# Patient Record
Sex: Male | Born: 1945
Health system: Southern US, Community
[De-identification: ages and names within clinical notes are randomized; demographics above are authoritative.]

## PROBLEM LIST (undated history)

## (undated) DIAGNOSIS — T7840XA Allergy, unspecified, initial encounter: Secondary | ICD-10-CM

## (undated) DIAGNOSIS — N200 Calculus of kidney: Secondary | ICD-10-CM

## (undated) DIAGNOSIS — K589 Irritable bowel syndrome without diarrhea: Secondary | ICD-10-CM

## (undated) DIAGNOSIS — I639 Cerebral infarction, unspecified: Secondary | ICD-10-CM

## (undated) DIAGNOSIS — N400 Enlarged prostate without lower urinary tract symptoms: Secondary | ICD-10-CM

## (undated) DIAGNOSIS — M1990 Unspecified osteoarthritis, unspecified site: Secondary | ICD-10-CM

## (undated) DIAGNOSIS — Z0000 Encounter for general adult medical examination without abnormal findings: Secondary | ICD-10-CM

## (undated) DIAGNOSIS — F32A Depression, unspecified: Secondary | ICD-10-CM

## (undated) DIAGNOSIS — K2270 Barrett's esophagus without dysplasia: Secondary | ICD-10-CM

## (undated) DIAGNOSIS — H6090 Unspecified otitis externa, unspecified ear: Secondary | ICD-10-CM

## (undated) DIAGNOSIS — B019 Varicella without complication: Secondary | ICD-10-CM

## (undated) DIAGNOSIS — B269 Mumps without complication: Secondary | ICD-10-CM

## (undated) DIAGNOSIS — J302 Other seasonal allergic rhinitis: Secondary | ICD-10-CM

## (undated) DIAGNOSIS — R739 Hyperglycemia, unspecified: Secondary | ICD-10-CM

## (undated) DIAGNOSIS — C44300 Unspecified malignant neoplasm of skin of unspecified part of face: Secondary | ICD-10-CM

## (undated) DIAGNOSIS — I10 Essential (primary) hypertension: Secondary | ICD-10-CM

## (undated) DIAGNOSIS — K219 Gastro-esophageal reflux disease without esophagitis: Secondary | ICD-10-CM

## (undated) DIAGNOSIS — Z87442 Personal history of urinary calculi: Secondary | ICD-10-CM

## (undated) DIAGNOSIS — M79642 Pain in left hand: Secondary | ICD-10-CM

## (undated) DIAGNOSIS — B059 Measles without complication: Secondary | ICD-10-CM

## (undated) DIAGNOSIS — E785 Hyperlipidemia, unspecified: Secondary | ICD-10-CM

## (undated) DIAGNOSIS — J45909 Unspecified asthma, uncomplicated: Secondary | ICD-10-CM

## (undated) DIAGNOSIS — F329 Major depressive disorder, single episode, unspecified: Secondary | ICD-10-CM

## (undated) DIAGNOSIS — F419 Anxiety disorder, unspecified: Secondary | ICD-10-CM

## (undated) DIAGNOSIS — G20A1 Parkinson's disease without dyskinesia, without mention of fluctuations: Secondary | ICD-10-CM

## (undated) DIAGNOSIS — Q2112 Patent foramen ovale: Secondary | ICD-10-CM

## (undated) HISTORY — DX: Pain in left hand: M79.642

## (undated) HISTORY — DX: Irritable bowel syndrome, unspecified: K58.9

## (undated) HISTORY — DX: Unspecified osteoarthritis, unspecified site: M19.90

## (undated) HISTORY — DX: Mumps without complication: B26.9

## (undated) HISTORY — PX: WISDOM TOOTH EXTRACTION: SHX21

## (undated) HISTORY — DX: Allergy, unspecified, initial encounter: T78.40XA

## (undated) HISTORY — DX: Barrett's esophagus without dysplasia: K22.70

## (undated) HISTORY — DX: Benign prostatic hyperplasia without lower urinary tract symptoms: N40.0

## (undated) HISTORY — DX: Measles without complication: B05.9

## (undated) HISTORY — DX: Hyperglycemia, unspecified: R73.9

## (undated) HISTORY — PX: COLONOSCOPY: SHX174

## (undated) HISTORY — DX: Essential (primary) hypertension: I10

## (undated) HISTORY — PX: LITHOTRIPSY: SUR834

## (undated) HISTORY — DX: Unspecified asthma, uncomplicated: J45.909

## (undated) HISTORY — DX: Cerebral infarction, unspecified: I63.9

## (undated) HISTORY — DX: Varicella without complication: B01.9

## (undated) HISTORY — DX: Encounter for general adult medical examination without abnormal findings: Z00.00

## (undated) HISTORY — DX: Unspecified otitis externa, unspecified ear: H60.90

## (undated) HISTORY — DX: Other seasonal allergic rhinitis: J30.2

---

## 2011-10-08 DIAGNOSIS — F339 Major depressive disorder, recurrent, unspecified: Secondary | ICD-10-CM | POA: Diagnosis not present

## 2011-10-08 DIAGNOSIS — F411 Generalized anxiety disorder: Secondary | ICD-10-CM | POA: Diagnosis not present

## 2012-01-09 DIAGNOSIS — E789 Disorder of lipoprotein metabolism, unspecified: Secondary | ICD-10-CM | POA: Diagnosis not present

## 2012-01-09 DIAGNOSIS — R7301 Impaired fasting glucose: Secondary | ICD-10-CM | POA: Diagnosis not present

## 2012-01-09 DIAGNOSIS — E538 Deficiency of other specified B group vitamins: Secondary | ICD-10-CM | POA: Diagnosis not present

## 2012-01-09 DIAGNOSIS — I10 Essential (primary) hypertension: Secondary | ICD-10-CM | POA: Diagnosis not present

## 2012-01-09 DIAGNOSIS — E559 Vitamin D deficiency, unspecified: Secondary | ICD-10-CM | POA: Diagnosis not present

## 2012-01-10 DIAGNOSIS — F411 Generalized anxiety disorder: Secondary | ICD-10-CM | POA: Diagnosis not present

## 2012-01-10 DIAGNOSIS — F339 Major depressive disorder, recurrent, unspecified: Secondary | ICD-10-CM | POA: Diagnosis not present

## 2012-01-17 DIAGNOSIS — E291 Testicular hypofunction: Secondary | ICD-10-CM | POA: Diagnosis not present

## 2012-01-17 DIAGNOSIS — I6350 Cerebral infarction due to unspecified occlusion or stenosis of unspecified cerebral artery: Secondary | ICD-10-CM | POA: Diagnosis not present

## 2012-01-17 DIAGNOSIS — E7800 Pure hypercholesterolemia, unspecified: Secondary | ICD-10-CM | POA: Diagnosis not present

## 2012-01-17 DIAGNOSIS — Z23 Encounter for immunization: Secondary | ICD-10-CM | POA: Diagnosis not present

## 2012-04-02 DIAGNOSIS — R350 Frequency of micturition: Secondary | ICD-10-CM | POA: Diagnosis not present

## 2012-04-07 DIAGNOSIS — F339 Major depressive disorder, recurrent, unspecified: Secondary | ICD-10-CM | POA: Diagnosis not present

## 2012-04-07 DIAGNOSIS — F411 Generalized anxiety disorder: Secondary | ICD-10-CM | POA: Diagnosis not present

## 2012-04-23 DIAGNOSIS — L821 Other seborrheic keratosis: Secondary | ICD-10-CM | POA: Diagnosis not present

## 2012-04-23 DIAGNOSIS — D1801 Hemangioma of skin and subcutaneous tissue: Secondary | ICD-10-CM | POA: Diagnosis not present

## 2012-04-23 DIAGNOSIS — L909 Atrophic disorder of skin, unspecified: Secondary | ICD-10-CM | POA: Diagnosis not present

## 2012-04-23 DIAGNOSIS — L570 Actinic keratosis: Secondary | ICD-10-CM | POA: Diagnosis not present

## 2012-04-23 DIAGNOSIS — D235 Other benign neoplasm of skin of trunk: Secondary | ICD-10-CM | POA: Diagnosis not present

## 2012-05-05 DIAGNOSIS — R350 Frequency of micturition: Secondary | ICD-10-CM | POA: Diagnosis not present

## 2012-05-05 DIAGNOSIS — R300 Dysuria: Secondary | ICD-10-CM | POA: Diagnosis not present

## 2012-07-01 DIAGNOSIS — H1045 Other chronic allergic conjunctivitis: Secondary | ICD-10-CM | POA: Diagnosis not present

## 2012-07-01 DIAGNOSIS — H2510 Age-related nuclear cataract, unspecified eye: Secondary | ICD-10-CM | POA: Diagnosis not present

## 2012-07-07 DIAGNOSIS — F411 Generalized anxiety disorder: Secondary | ICD-10-CM | POA: Diagnosis not present

## 2012-07-07 DIAGNOSIS — F339 Major depressive disorder, recurrent, unspecified: Secondary | ICD-10-CM | POA: Diagnosis not present

## 2012-07-09 DIAGNOSIS — Z23 Encounter for immunization: Secondary | ICD-10-CM | POA: Diagnosis not present

## 2012-08-19 DIAGNOSIS — M545 Low back pain: Secondary | ICD-10-CM | POA: Diagnosis not present

## 2012-09-25 DIAGNOSIS — I10 Essential (primary) hypertension: Secondary | ICD-10-CM | POA: Diagnosis not present

## 2012-09-25 DIAGNOSIS — Z8673 Personal history of transient ischemic attack (TIA), and cerebral infarction without residual deficits: Secondary | ICD-10-CM | POA: Diagnosis not present

## 2012-09-25 DIAGNOSIS — F329 Major depressive disorder, single episode, unspecified: Secondary | ICD-10-CM | POA: Diagnosis not present

## 2012-09-25 DIAGNOSIS — M549 Dorsalgia, unspecified: Secondary | ICD-10-CM | POA: Diagnosis not present

## 2012-10-06 DIAGNOSIS — I10 Essential (primary) hypertension: Secondary | ICD-10-CM | POA: Diagnosis not present

## 2012-10-06 DIAGNOSIS — R5381 Other malaise: Secondary | ICD-10-CM | POA: Diagnosis not present

## 2012-10-06 DIAGNOSIS — Z125 Encounter for screening for malignant neoplasm of prostate: Secondary | ICD-10-CM | POA: Diagnosis not present

## 2012-10-06 DIAGNOSIS — E789 Disorder of lipoprotein metabolism, unspecified: Secondary | ICD-10-CM | POA: Diagnosis not present

## 2012-11-05 DIAGNOSIS — M999 Biomechanical lesion, unspecified: Secondary | ICD-10-CM | POA: Diagnosis not present

## 2012-11-05 DIAGNOSIS — S332XXA Dislocation of sacroiliac and sacrococcygeal joint, initial encounter: Secondary | ICD-10-CM | POA: Diagnosis not present

## 2012-11-05 DIAGNOSIS — S33101A Dislocation of unspecified lumbar vertebra, initial encounter: Secondary | ICD-10-CM | POA: Diagnosis not present

## 2012-11-06 DIAGNOSIS — S332XXA Dislocation of sacroiliac and sacrococcygeal joint, initial encounter: Secondary | ICD-10-CM | POA: Diagnosis not present

## 2012-11-06 DIAGNOSIS — S33101A Dislocation of unspecified lumbar vertebra, initial encounter: Secondary | ICD-10-CM | POA: Diagnosis not present

## 2012-11-06 DIAGNOSIS — M999 Biomechanical lesion, unspecified: Secondary | ICD-10-CM | POA: Diagnosis not present

## 2012-11-07 DIAGNOSIS — S332XXA Dislocation of sacroiliac and sacrococcygeal joint, initial encounter: Secondary | ICD-10-CM | POA: Diagnosis not present

## 2012-11-07 DIAGNOSIS — M999 Biomechanical lesion, unspecified: Secondary | ICD-10-CM | POA: Diagnosis not present

## 2012-11-07 DIAGNOSIS — S33101A Dislocation of unspecified lumbar vertebra, initial encounter: Secondary | ICD-10-CM | POA: Diagnosis not present

## 2012-11-10 DIAGNOSIS — M999 Biomechanical lesion, unspecified: Secondary | ICD-10-CM | POA: Diagnosis not present

## 2012-11-10 DIAGNOSIS — S33101A Dislocation of unspecified lumbar vertebra, initial encounter: Secondary | ICD-10-CM | POA: Diagnosis not present

## 2012-11-10 DIAGNOSIS — S332XXA Dislocation of sacroiliac and sacrococcygeal joint, initial encounter: Secondary | ICD-10-CM | POA: Diagnosis not present

## 2012-11-12 DIAGNOSIS — S33101A Dislocation of unspecified lumbar vertebra, initial encounter: Secondary | ICD-10-CM | POA: Diagnosis not present

## 2012-11-12 DIAGNOSIS — M999 Biomechanical lesion, unspecified: Secondary | ICD-10-CM | POA: Diagnosis not present

## 2012-11-12 DIAGNOSIS — S332XXA Dislocation of sacroiliac and sacrococcygeal joint, initial encounter: Secondary | ICD-10-CM | POA: Diagnosis not present

## 2012-12-15 DIAGNOSIS — F411 Generalized anxiety disorder: Secondary | ICD-10-CM | POA: Diagnosis not present

## 2012-12-15 DIAGNOSIS — F339 Major depressive disorder, recurrent, unspecified: Secondary | ICD-10-CM | POA: Diagnosis not present

## 2012-12-16 DIAGNOSIS — K219 Gastro-esophageal reflux disease without esophagitis: Secondary | ICD-10-CM | POA: Diagnosis not present

## 2012-12-16 DIAGNOSIS — I6350 Cerebral infarction due to unspecified occlusion or stenosis of unspecified cerebral artery: Secondary | ICD-10-CM | POA: Diagnosis not present

## 2012-12-16 DIAGNOSIS — Z1211 Encounter for screening for malignant neoplasm of colon: Secondary | ICD-10-CM | POA: Diagnosis not present

## 2013-01-08 DIAGNOSIS — Z1211 Encounter for screening for malignant neoplasm of colon: Secondary | ICD-10-CM | POA: Diagnosis not present

## 2013-01-08 DIAGNOSIS — I10 Essential (primary) hypertension: Secondary | ICD-10-CM | POA: Diagnosis not present

## 2013-01-08 DIAGNOSIS — K219 Gastro-esophageal reflux disease without esophagitis: Secondary | ICD-10-CM | POA: Diagnosis not present

## 2013-05-06 DIAGNOSIS — L821 Other seborrheic keratosis: Secondary | ICD-10-CM | POA: Diagnosis not present

## 2013-05-06 DIAGNOSIS — D235 Other benign neoplasm of skin of trunk: Secondary | ICD-10-CM | POA: Diagnosis not present

## 2013-05-06 DIAGNOSIS — D1801 Hemangioma of skin and subcutaneous tissue: Secondary | ICD-10-CM | POA: Diagnosis not present

## 2013-05-06 DIAGNOSIS — L570 Actinic keratosis: Secondary | ICD-10-CM | POA: Diagnosis not present

## 2013-05-06 DIAGNOSIS — D2360 Other benign neoplasm of skin of unspecified upper limb, including shoulder: Secondary | ICD-10-CM | POA: Diagnosis not present

## 2013-05-20 DIAGNOSIS — J309 Allergic rhinitis, unspecified: Secondary | ICD-10-CM | POA: Diagnosis not present

## 2013-05-20 DIAGNOSIS — I10 Essential (primary) hypertension: Secondary | ICD-10-CM | POA: Diagnosis not present

## 2013-05-20 DIAGNOSIS — I6350 Cerebral infarction due to unspecified occlusion or stenosis of unspecified cerebral artery: Secondary | ICD-10-CM | POA: Diagnosis not present

## 2013-05-20 DIAGNOSIS — N400 Enlarged prostate without lower urinary tract symptoms: Secondary | ICD-10-CM | POA: Diagnosis not present

## 2013-06-03 DIAGNOSIS — R319 Hematuria, unspecified: Secondary | ICD-10-CM | POA: Diagnosis not present

## 2013-06-03 DIAGNOSIS — R350 Frequency of micturition: Secondary | ICD-10-CM | POA: Diagnosis not present

## 2013-06-03 DIAGNOSIS — N200 Calculus of kidney: Secondary | ICD-10-CM | POA: Diagnosis not present

## 2013-06-04 DIAGNOSIS — N200 Calculus of kidney: Secondary | ICD-10-CM | POA: Diagnosis not present

## 2013-06-04 DIAGNOSIS — N1330 Unspecified hydronephrosis: Secondary | ICD-10-CM | POA: Diagnosis not present

## 2013-06-09 DIAGNOSIS — N200 Calculus of kidney: Secondary | ICD-10-CM | POA: Diagnosis not present

## 2013-06-09 DIAGNOSIS — Z79899 Other long term (current) drug therapy: Secondary | ICD-10-CM | POA: Diagnosis not present

## 2013-06-09 DIAGNOSIS — I10 Essential (primary) hypertension: Secondary | ICD-10-CM | POA: Diagnosis not present

## 2013-06-09 DIAGNOSIS — I998 Other disorder of circulatory system: Secondary | ICD-10-CM | POA: Diagnosis not present

## 2013-06-11 DIAGNOSIS — N200 Calculus of kidney: Secondary | ICD-10-CM | POA: Diagnosis not present

## 2013-06-11 DIAGNOSIS — I998 Other disorder of circulatory system: Secondary | ICD-10-CM | POA: Diagnosis not present

## 2013-06-11 DIAGNOSIS — Z79899 Other long term (current) drug therapy: Secondary | ICD-10-CM | POA: Diagnosis not present

## 2013-06-11 DIAGNOSIS — I10 Essential (primary) hypertension: Secondary | ICD-10-CM | POA: Diagnosis not present

## 2013-06-16 DIAGNOSIS — F411 Generalized anxiety disorder: Secondary | ICD-10-CM | POA: Diagnosis not present

## 2013-06-18 DIAGNOSIS — N200 Calculus of kidney: Secondary | ICD-10-CM | POA: Diagnosis not present

## 2013-07-16 DIAGNOSIS — Z23 Encounter for immunization: Secondary | ICD-10-CM | POA: Diagnosis not present

## 2013-09-28 DIAGNOSIS — N401 Enlarged prostate with lower urinary tract symptoms: Secondary | ICD-10-CM | POA: Diagnosis not present

## 2013-09-28 DIAGNOSIS — N529 Male erectile dysfunction, unspecified: Secondary | ICD-10-CM | POA: Diagnosis not present

## 2013-09-28 DIAGNOSIS — N200 Calculus of kidney: Secondary | ICD-10-CM | POA: Diagnosis not present

## 2013-09-30 LAB — HM COLONOSCOPY: HM COLON: NORMAL

## 2013-10-30 ENCOUNTER — Emergency Department (HOSPITAL_BASED_OUTPATIENT_CLINIC_OR_DEPARTMENT_OTHER)
Admission: EM | Admit: 2013-10-30 | Discharge: 2013-10-30 | Disposition: A | Discharge status: Home / Self Care | Payer: Medicare Other | Attending: Emergency Medicine | Admitting: Emergency Medicine

## 2013-10-30 ENCOUNTER — Encounter (HOSPITAL_BASED_OUTPATIENT_CLINIC_OR_DEPARTMENT_OTHER): Payer: Self-pay | Admitting: Emergency Medicine

## 2013-10-30 ENCOUNTER — Emergency Department (HOSPITAL_BASED_OUTPATIENT_CLINIC_OR_DEPARTMENT_OTHER): Payer: Medicare Other

## 2013-10-30 DIAGNOSIS — Z87442 Personal history of urinary calculi: Secondary | ICD-10-CM | POA: Insufficient documentation

## 2013-10-30 DIAGNOSIS — Z8673 Personal history of transient ischemic attack (TIA), and cerebral infarction without residual deficits: Secondary | ICD-10-CM | POA: Insufficient documentation

## 2013-10-30 DIAGNOSIS — Z79899 Other long term (current) drug therapy: Secondary | ICD-10-CM | POA: Diagnosis not present

## 2013-10-30 DIAGNOSIS — E785 Hyperlipidemia, unspecified: Secondary | ICD-10-CM | POA: Diagnosis not present

## 2013-10-30 DIAGNOSIS — F329 Major depressive disorder, single episode, unspecified: Secondary | ICD-10-CM | POA: Insufficient documentation

## 2013-10-30 DIAGNOSIS — M79609 Pain in unspecified limb: Secondary | ICD-10-CM | POA: Insufficient documentation

## 2013-10-30 DIAGNOSIS — K219 Gastro-esophageal reflux disease without esophagitis: Secondary | ICD-10-CM | POA: Diagnosis not present

## 2013-10-30 DIAGNOSIS — M79644 Pain in right finger(s): Secondary | ICD-10-CM

## 2013-10-30 DIAGNOSIS — M19049 Primary osteoarthritis, unspecified hand: Secondary | ICD-10-CM | POA: Diagnosis not present

## 2013-10-30 DIAGNOSIS — F3289 Other specified depressive episodes: Secondary | ICD-10-CM | POA: Diagnosis not present

## 2013-10-30 DIAGNOSIS — I10 Essential (primary) hypertension: Secondary | ICD-10-CM | POA: Diagnosis not present

## 2013-10-30 HISTORY — DX: Depression, unspecified: F32.A

## 2013-10-30 HISTORY — DX: Calculus of kidney: N20.0

## 2013-10-30 HISTORY — DX: Gastro-esophageal reflux disease without esophagitis: K21.9

## 2013-10-30 HISTORY — DX: Essential (primary) hypertension: I10

## 2013-10-30 HISTORY — DX: Major depressive disorder, single episode, unspecified: F32.9

## 2013-10-30 HISTORY — DX: Cerebral infarction, unspecified: I63.9

## 2013-10-30 HISTORY — DX: Hyperlipidemia, unspecified: E78.5

## 2013-10-30 MED ORDER — IBUPROFEN 400 MG PO TABS: 400.0000 mg | ORAL_TABLET | Freq: Four times a day (QID) | ORAL | Status: DC | PRN

## 2013-10-30 NOTE — ED Notes (Signed)
Pt reports lifting a bed 1 week ago and has had pain in right and since.  Pain is better with immobilization.

## 2013-10-30 NOTE — ED Notes (Signed)
Patient transported to X-ray 

## 2013-10-30 NOTE — Discharge Instructions (Signed)
De Quervain's Tenosynovitis  De Quervain's tenosynovitis involves inflammation of one or two tendon linings (sheaths) or strain of one or two tendons to the thumb: extensor pollicis brevis (EPB), or abductor pollicis longus (APL). This causes pain on the side of the wrist and base of the thumb. Tendon sheaths secrete a fluid that lubricates the tendon, allowing the tendon to move smoothly. When the sheath becomes inflamed, the tendon cannot move freely in the sheath. Both the EPB and APL tendons are important for proper use of the hand. The EPB tendon is important for straightening the thumb. The APL tendon is important for moving the thumb away from the index finger (abducting). The two tendons pass through a small tube (canal) in the wrist, near the base of the thumb. When the tendons become inflamed, pain is usually felt in this area.  SYMPTOMS   · Pain, tenderness, swelling, warmth, or redness over the base of the thumb and thumb side of the wrist.  · Pain that gets worse when straightening the thumb.  · Pain that gets worse when moving the thumb away from the index finger, against resistance.  · Pain with pinching or gripping.  · Locking or catching of the thumb.  · Limited motion of the thumb.  · Crackling sound (crepitation) when the tendon or thumb is moved or touched.  · Fluid-filled cyst in the area of the base of the thumb.  CAUSES   · Tenosynovitis is often linked with overuse of the wrist.  · Tenosynovitis may be caused by repeated injury to the thumb muscle and tendon units, and with repeated motions of the hand and wrist, due to friction of the tendon within the lining (sheath).  · Tenosynovitis may also be due to a sudden increase in activity or change in activity.  RISK INCREASES WITH:  · Sports that involve repeated hand and wrist motions (golf, bowling, tennis, squash, racquetball).  · Heavy labor.  · Poor physical wrist strength and flexibility.  · Failure to warm up properly before practice or  play.  · Male gender.  · New mothers who hold their baby's head for long periods or lift infants with thumbs in the infant's armpit (axilla).  PREVENTION  · Warm up and stretch properly before practice or competition.  · Allow enough time for rest and recovery between practices and competition.  · Maintain appropriate conditioning:  · Cardiovascular fitness.  · Forearm, wrist, and hand flexibility.  · Muscle strength and endurance.  · Use proper exercise technique.  PROGNOSIS   This condition is usually curable within 6 weeks, if treated properly with non-surgical treatment and resting of the affected area.   RELATED COMPLICATIONS   · Longer healing time if not properly treated or if not given enough time to heal.  · Chronic inflammation, causing recurring symptoms of tenosynovitis. Permanent pain or restriction of movement.  · Risks of surgery: infection, bleeding, injury to nerves (numbness of the thumb), continued pain, incomplete release of the tendon sheath, recurring symptoms, cutting of the tendons, tendons sliding out of position, weakness of the thumb, thumb stiffness.  TREATMENT   First, treatment involves the use of medicine and ice, to reduce pain and inflammation. Patients are encouraged to stop or modify activities that aggravate the injury. Stretching and strengthening exercises may be advised. Exercises may be completed at home or with a therapist. You may be fitted with a brace or splint, to limit motion and allow the injury to heal. Your caregiver   may also choose to give you a corticosteroid injection, to reduce the pain and inflammation. If non-surgical treatment is not successful, surgery may be needed. Most tenosynovitis surgeries are done as outpatient procedures (you go home the same day). Surgery may involve local, regional (whole arm), or general anesthesia.   MEDICATION   · If pain medicine is needed, nonsteroidal anti-inflammatory medicines (aspirin and ibuprofen), or other minor pain  relievers (acetaminophen), are often advised.  · Do not take pain medicine for 7 days before surgery.  · Prescription pain relievers are often prescribed only after surgery. Use only as directed and only as much as you need.  · Corticosteroid injections may be given if your caregiver thinks they are needed. There is a limited number of times these injections may be given.  COLD THERAPY   · Cold treatment (icing) should be applied for 10 to 15 minutes every 2 to 3 hours for inflammation and pain, and immediately after activity that aggravates your symptoms. Use ice packs or an ice massage.  SEEK MEDICAL CARE IF:   · Symptoms get worse or do not improve in 2 to 4 weeks, despite treatment.  · You experience pain, numbness, or coldness in the hand.  · Blue, gray, or dark color appears in the fingernails.  · Any of the following occur after surgery: increased pain, swelling, redness, drainage of fluids, bleeding in the affected area, or signs of infection.  · New, unexplained symptoms develop. (Drugs used in treatment may produce side effects.)  Document Released: 09/03/2005 Document Revised: 11/26/2011 Document Reviewed: 12/16/2008  [employer]® Patient Information ©2014 [employer].

## 2013-10-30 NOTE — ED Provider Notes (Signed)
CSN: [id number]     Arrival date & time 10/30/13  7106 History   First MD Initiated Contact with Patient 10/30/13 0940     Chief Complaint  Patient presents with  . Hand Pain     (Consider location/radiation/quality/duration/timing/severity/associated sxs/prior Treatment) HPI Comments: Pt reports sharp pain over R thumb, thenar eminence, radiation sometimes into pointer finger.  Pain worse with flexion or opposition of the thumb. Pain better w/ immobilization.  No definite injury mechanism, but has been using hand saw, moving furniture since moving in Nov.   Patient is a 68 y.o. male presenting with hand pain. The history is provided by the patient. No language interpreter was used.  Hand Pain This is a new problem. The current episode started more than 2 days ago. The problem occurs constantly. The problem has not changed since onset.Pertinent negatives include no chest pain, no abdominal pain, no headaches and no shortness of breath. Nothing aggravates the symptoms. He has tried nothing for the symptoms. The treatment provided no relief.    Past Medical History  Diagnosis Date  . Kidney stones   . CVA (cerebral infarction)   . Depression   . Hyperlipemia   . Hypertension   . GERD (gastroesophageal reflux disease)    Past Surgical History  Procedure Laterality Date  . Lithotripsy    . Cystostectomy     No family history on file. History  Substance Use Topics  . Smoking status: Never Smoker   . Smokeless tobacco: Not on file  . Alcohol Use: Yes     Comment: 1-2 daily    Review of Systems  Constitutional: Negative for fever, activity change, appetite change and fatigue.  HENT: Negative for congestion, facial swelling, rhinorrhea and trouble swallowing.   Eyes: Negative for photophobia and pain.  Respiratory: Negative for cough, chest tightness and shortness of breath.   Cardiovascular: Negative for chest pain and leg swelling.  Gastrointestinal: Negative for nausea,  vomiting, abdominal pain, diarrhea and constipation.  Endocrine: Negative for polydipsia and polyuria.  Genitourinary: Negative for dysuria, urgency, decreased urine volume and difficulty urinating.  Musculoskeletal: Negative for back pain and gait problem.  Skin: Negative for color change, rash and wound.  Allergic/Immunologic: Negative for immunocompromised state.  Neurological: Negative for dizziness, facial asymmetry, speech difficulty, weakness, numbness and headaches.  Psychiatric/Behavioral: Negative for confusion, decreased concentration and agitation.      Allergies  Review of patient's allergies indicates no known allergies.  Home Medications   Current Outpatient Rx  Name  Route  Sig  Dispense  Refill  . Atorvastatin Calcium (LIPITOR PO)   Oral   Take by mouth.         . Cetirizine HCl (ZYRTEC ALLERGY PO)   Oral   Take by mouth.         . Citalopram Hydrobromide (CELEXA PO)   Oral   Take by mouth.         . Multiple Vitamins-Minerals (MULTIVITAMINS THER. W/MINERALS) TABS tablet   Oral   Take 1 tablet by mouth daily.         Marland Kitchen OMEPRAZOLE PO   Oral   Take by mouth.         Marland Kitchen UNKNOWN TO PATIENT               . ibuprofen (ADVIL,MOTRIN) 400 MG tablet   Oral   Take 1 tablet (400 mg total) by mouth every 6 (six) hours as needed.   30 tablet   0  BP 142/78  Pulse 85  Temp(Src) 98 F (36.7 C) (Oral)  Resp 18  Ht 5\' 8"  (1.727 m)  Wt 173 lb (78.472 kg)  BMI 26.31 kg/m2  SpO2 100% Physical Exam  Constitutional: He is oriented to person, place, and time. He appears well-developed and well-nourished. No distress.  HENT:  Head: Normocephalic and atraumatic.  Mouth/Throat: No oropharyngeal exudate.  Eyes: Pupils are equal, round, and reactive to light.  Neck: Normal range of motion. Neck supple.  Cardiovascular: Normal rate, regular rhythm and normal heart sounds.  Exam reveals no gallop and no friction rub.   No murmur  heard. Pulmonary/Chest: Effort normal and breath sounds normal. No respiratory distress. He has no wheezes. He has no rales.  Abdominal: Soft. Bowel sounds are normal. He exhibits no distension and no mass. There is no tenderness. There is no rebound and no guarding.  Musculoskeletal: Normal range of motion. He exhibits no edema.       Left hand: He exhibits tenderness and bony tenderness. He exhibits normal range of motion, normal two-point discrimination, normal capillary refill, no deformity and no laceration. Normal sensation noted. Decreased sensation is not present in the ulnar distribution and is not present in the medial redistribution. Normal strength noted.       Hands: Pain w/ flexion of thumb and opposition of thumb. +finekstein's test.   Neurological: He is alert and oriented to person, place, and time.  Skin: Skin is warm and dry.  Psychiatric: He has a normal mood and affect.    ED Course  Procedures (including critical care time) Labs Review Labs Reviewed - No data to display Imaging Review Dg Hand Complete Right  10/30/2013   CLINICAL DATA:  Pain  EXAM: RIGHT HAND - COMPLETE 3+ VIEW  COMPARISON:  None.  FINDINGS: Frontal, oblique, and lateral views were obtained. There is no fracture or dislocation. There is osteoarthritic change in the scaphotrapezial and saddle joints. There is also slight narrowing of all PIP and DIP joints. No erosive change or periostitis.  IMPRESSION: Multifocal osteoarthritic change. No fracture or dislocation. No erosive change.   Electronically Signed   By: Lowella Grip M.D.   On: 10/30/2013 09:57    EKG Interpretation   None       MDM   Final diagnoses:  Pain of right thumb    Pt is a 68 y.o. male with Pmhx as above who presents with with L hand pain over thenar eminence, thumb.  He has pain w/ flexion and opposition of thumb, sometimes radiation into pointer finger. No definite mechanism of injury, but has been doing increased handy  work since moving in Nov. Including moving furniture, using hand saw. On PE, VSS, pt in NAD.  Nml strength, sensation, cap refill.  +ttp over thenar eminence w/o wasting, + pain w/ Finkelstein's test. XR shows no focal fx, multifocal OA.  Suspect De. Quervain's tenosynovitis.  Will place in removable thumb spica, rec scheduled NSAIDs and have referred to Dr. Barbaraann Barthel w/ Sports medicine.       Neta Ehlers, MD 10/30/13 1013

## 2013-11-02 ENCOUNTER — Ambulatory Visit (INDEPENDENT_AMBULATORY_CARE_PROVIDER_SITE_OTHER): Payer: Medicare Other | Admitting: Family Medicine

## 2013-11-02 ENCOUNTER — Encounter: Payer: Self-pay | Admitting: Family Medicine

## 2013-11-02 VITALS — BP 123/79 | HR 76 | Ht 68.0 in | Wt 172.0 lb

## 2013-11-02 DIAGNOSIS — M189 Osteoarthritis of first carpometacarpal joint, unspecified: Secondary | ICD-10-CM

## 2013-11-02 DIAGNOSIS — M19049 Primary osteoarthritis, unspecified hand: Secondary | ICD-10-CM

## 2013-11-02 NOTE — Patient Instructions (Signed)
You have synovitis, flare of arthritis at the base of your thumb. You were given a cortisone shot today for this - avoid strenous activities with hand for 5-7 days including golf, manual labor. Ice or heat 15 minutes at a time 3-4 times a day (whichever feels better). Wear thumb spica brace as often as possible. Ibuprofen 800mg  three times a day OR aleve 2 tabs twice a day with food for pain and inflammation. Follow up with me in 1 month or as needed.

## 2013-11-04 ENCOUNTER — Encounter: Payer: Self-pay | Admitting: Family Medicine

## 2013-11-04 DIAGNOSIS — M189 Osteoarthritis of first carpometacarpal joint, unspecified: Secondary | ICD-10-CM | POA: Insufficient documentation

## 2013-11-04 DIAGNOSIS — N200 Calculus of kidney: Secondary | ICD-10-CM | POA: Diagnosis not present

## 2013-11-04 NOTE — Assessment & Plan Note (Signed)
discussed options.  He would like to go ahead with cortisone injection which was given today.  Continue thumb spica brace.  NSAIDs as needed.  Ice/heat as needed.  F/u in 1 month or prn.  After informed written consent patient was seated in chair in exam room.  Area overlying 1st CMC was prepped with alcohol swab after using ultrasound to identify joint space away from radial artery.  Then right 1st CMC joint injected with 0.5:0.34mL marcaine:depomedrol.  Patient tolerated procedure well without immediate complications.

## 2013-11-04 NOTE — Progress Notes (Signed)
Patient ID: Paul Drake, male   DOB: 1946-05-25, 68 y.o.   MRN: [id number]  PCP: No primary provider on file.  Subjective:   HPI: Patient is a 68 y.o. male here for right thumb pain.  Patient reports pain started on 2/6. He was moving furniture, a lot of pushing and squeezing that day. Started to get pain then but nothing acute. Some swelling. Taking ibuprofen. Pain localized to base of thumb. Using thumb spica brace from ED. Radiographs negative for fracture though does have DJD> Left handed  Past Medical History  Diagnosis Date  . Kidney stones   . CVA (cerebral infarction)   . Depression   . Hyperlipemia   . Hypertension   . GERD (gastroesophageal reflux disease)     Current Outpatient Prescriptions on File Prior to Visit  Medication Sig Dispense Refill  . Atorvastatin Calcium (LIPITOR PO) Take by mouth.      . Cetirizine HCl (ZYRTEC ALLERGY PO) Take by mouth.      . Citalopram Hydrobromide (CELEXA PO) Take by mouth.      Marland Kitchen ibuprofen (ADVIL,MOTRIN) 400 MG tablet Take 1 tablet (400 mg total) by mouth every 6 (six) hours as needed.  30 tablet  0  . Multiple Vitamins-Minerals (MULTIVITAMINS THER. W/MINERALS) TABS tablet Take 1 tablet by mouth daily.      Marland Kitchen OMEPRAZOLE PO Take by mouth.      Marland Kitchen UNKNOWN TO PATIENT        No current facility-administered medications on file prior to visit.    Past Surgical History  Procedure Laterality Date  . Lithotripsy    . Cystostectomy      No Known Allergies  History   Social History  . Marital Status: Married    Spouse Name: N/A    Number of Children: N/A  . Years of Education: N/A   Occupational History  . Not on file.   Social History Main Topics  . Smoking status: Never Smoker   . Smokeless tobacco: Not on file  . Alcohol Use: Yes     Comment: 1-2 daily  . Drug Use: No  . Sexual Activity: Not on file   Other Topics Concern  . Not on file   Social History Narrative  . No narrative on file    Family  History  Problem Relation Age of Onset  . Hypertension Mother   . Hyperlipidemia Mother   . Diabetes Sister   . Hyperlipidemia Brother   . Hypertension Brother     BP 123/79  Pulse 76  Ht 5\' 8"  (1.727 m)  Wt 172 lb (78.019 kg)  BMI 26.16 kg/m2  Review of Systems: See HPI above.    Objective:  Physical Exam:  Gen: NAD  Right hand/wrist: No gross deformity, swelling, bruising. TTP 1st CMC joint.  No 1st dorsal compartment, carpal tunnel tenderness. FROM digits - pain with thumb motions. Strength 5/5 with finger abduction, thumb opposition, finger extension. NVI distally Negative finkelsteins, tinels, phalens.    Assessment & Plan:  1. Right 1st CMC DJD - discussed options.  He would like to go ahead with cortisone injection which was given today.  Continue thumb spica brace.  NSAIDs as needed.  Ice/heat as needed.  F/u in 1 month or prn.  After informed written consent patient was seated in chair in exam room.  Area overlying 1st CMC was prepped with alcohol swab after using ultrasound to identify joint space away from radial artery.  Then right 1st [facility] joint  injected with 0.5:0.15mL marcaine:depomedrol.  Patient tolerated procedure well without immediate complications.

## 2013-11-11 ENCOUNTER — Encounter: Payer: Self-pay | Admitting: Physician Assistant

## 2013-11-11 ENCOUNTER — Ambulatory Visit (INDEPENDENT_AMBULATORY_CARE_PROVIDER_SITE_OTHER): Payer: Medicare Other | Admitting: Physician Assistant

## 2013-11-11 VITALS — BP 130/90 | HR 68 | Temp 98.1°F | Resp 16 | Ht 68.0 in | Wt 183.2 lb

## 2013-11-11 DIAGNOSIS — I10 Essential (primary) hypertension: Secondary | ICD-10-CM

## 2013-11-11 DIAGNOSIS — Z7189 Other specified counseling: Secondary | ICD-10-CM

## 2013-11-11 DIAGNOSIS — N400 Enlarged prostate without lower urinary tract symptoms: Secondary | ICD-10-CM | POA: Diagnosis not present

## 2013-11-11 DIAGNOSIS — Z7689 Persons encountering health services in other specified circumstances: Secondary | ICD-10-CM

## 2013-11-11 DIAGNOSIS — F3289 Other specified depressive episodes: Secondary | ICD-10-CM

## 2013-11-11 DIAGNOSIS — F329 Major depressive disorder, single episode, unspecified: Secondary | ICD-10-CM

## 2013-11-11 DIAGNOSIS — Z23 Encounter for immunization: Secondary | ICD-10-CM | POA: Diagnosis not present

## 2013-11-11 DIAGNOSIS — N200 Calculus of kidney: Secondary | ICD-10-CM

## 2013-11-11 DIAGNOSIS — F32A Depression, unspecified: Secondary | ICD-10-CM

## 2013-11-11 DIAGNOSIS — E785 Hyperlipidemia, unspecified: Secondary | ICD-10-CM

## 2013-11-11 NOTE — Patient Instructions (Signed)
Continue medications as prescribed.  I will obtain your previous records.  If you are trying to come off of the Celexa -- take 1/2 tablet (10 mg) daily for 1-2 weeks, then take 1/2 tablet (10 mg ) every other day for 1-2 weeks before coming off of medications.  Follow-up in 1 month.  Return sooner if you need anything.  Depression, Adult Depression refers to feeling sad, low, down in the dumps, blue, gloomy, or empty. In general, there are two kinds of depression: 1. Depression that we all experience from time to time because of upsetting life experiences, including the loss of a job or the ending of a relationship (normal sadness or normal grief). This kind of depression is considered normal, is short lived, and resolves within a few days to 2 weeks. (Depression experienced after the loss of a loved one is called bereavement. Bereavement often lasts longer than 2 weeks but normally gets better with time.) 2. Clinical depression, which lasts longer than normal sadness or normal grief or interferes with your ability to function at home, at work, and in school. It also interferes with your personal relationships. It affects almost every aspect of your life. Clinical depression is an illness. Symptoms of depression also can be caused by conditions other than normal sadness and grief or clinical depression. Examples of these conditions are listed as follows:  Physical illness Some physical illnesses, including underactive thyroid gland (hypothyroidism), severe anemia, specific types of cancer, diabetes, uncontrolled seizures, heart and lung problems, strokes, and chronic pain are commonly associated with symptoms of depression.  Side effects of some prescription medicine In some people, certain types of prescription medicine can cause symptoms of depression.  Substance abuse Abuse of alcohol and illicit drugs can cause symptoms of depression. SYMPTOMS Symptoms of normal sadness and normal grief include the  following:  Feeling sad or crying for short periods of time.  Not caring about anything (apathy).  Difficulty sleeping or sleeping too much.  No longer able to enjoy the things you used to enjoy.  Desire to be by oneself all the time (social isolation).  Lack of energy or motivation.  Difficulty concentrating or remembering.  Change in appetite or weight.  Restlessness or agitation. Symptoms of clinical depression include the same symptoms of normal sadness or normal grief and also the following symptoms:  Feeling sad or crying all the time.  Feelings of guilt or worthlessness.  Feelings of hopelessness or helplessness.  Thoughts of suicide or the desire to harm yourself (suicidal ideation).  Loss of touch with reality (psychotic symptoms). Seeing or hearing things that are not real (hallucinations) or having false beliefs about your life or the people around you (delusions and paranoia). DIAGNOSIS  The diagnosis of clinical depression usually is based on the severity and duration of the symptoms. Your caregiver also will ask you questions about your medical history and substance use to find out if physical illness, use of prescription medicine, or substance abuse is causing your depression. Your caregiver also may order blood tests. TREATMENT  Typically, normal sadness and normal grief do not require treatment. However, sometimes antidepressant medicine is prescribed for bereavement to ease the depressive symptoms until they resolve. The treatment for clinical depression depends on the severity of your symptoms but typically includes antidepressant medicine, counseling with a mental health professional, or a combination of both. Your caregiver will help to determine what treatment is best for you. Depression caused by physical illness usually goes away with appropriate  medical treatment of the illness. If prescription medicine is causing depression, talk with your caregiver about  stopping the medicine, decreasing the dose, or substituting another medicine. Depression caused by abuse of alcohol or illicit drugs abuse goes away with abstinence from these substances. Some adults need professional help in order to stop drinking or using drugs. SEEK IMMEDIATE CARE IF:  You have thoughts about hurting yourself or others.  You lose touch with reality (have psychotic symptoms).  You are taking medicine for depression and have a serious side effect. FOR MORE INFORMATION National Alliance on Mental Illness: www.nami.[employer]: https://carter.com/ Document Released: 08/31/2000 Document Revised: 03/04/2012 Document Reviewed: 12/03/2011 [facility] Patient Information 2014 [place].  Hypertension As your heart beats, it forces blood through your arteries. This force is your blood pressure. If the pressure is too high, it is called hypertension (HTN) or high blood pressure. HTN is dangerous because you may have it and not know it. High blood pressure may mean that your heart has to work harder to pump blood. Your arteries may be narrow or stiff. The extra work puts you at risk for heart disease, stroke, and other problems.  Blood pressure consists of two numbers, a higher number over a lower, 110/72, for example. It is stated as "110 over 72." The ideal is below 120 for the top number (systolic) and under 80 for the bottom (diastolic). Write down your blood pressure today. You should pay close attention to your blood pressure if you have certain conditions such as:  Heart failure.  Prior heart attack.  Diabetes  Chronic kidney disease.  Prior stroke.  Multiple risk factors for heart disease. To see if you have HTN, your blood pressure should be measured while you are seated with your arm held at the level of the heart. It should be measured at least twice. A one-time elevated blood pressure reading (especially in the Emergency Department)  does not mean that you need treatment. There may be conditions in which the blood pressure is different between your right and left arms. It is important to see your caregiver soon for a recheck. Most people have essential hypertension which means that there is not a specific cause. This type of high blood pressure may be lowered by changing lifestyle factors such as:  Stress.  Smoking.  Lack of exercise.  Excessive weight.  Drug/tobacco/alcohol use.  Eating less salt. Most people do not have symptoms from high blood pressure until it has caused damage to the body. Effective treatment can often prevent, delay or reduce that damage. TREATMENT  When a cause has been identified, treatment for high blood pressure is directed at the cause. There are a large number of medications to treat HTN. These fall into several categories, and your caregiver will help you select the medicines that are best for you. Medications may have side effects. You should review side effects with your caregiver. If your blood pressure stays high after you have made lifestyle changes or started on medicines,   Your medication(s) may need to be changed.  Other problems may need to be addressed.  Be certain you understand your prescriptions, and know how and when to take your medicine.  Be sure to follow up with your caregiver within the time frame advised (usually within two weeks) to have your blood pressure rechecked and to review your medications.  If you are taking more than one medicine to lower your blood pressure, make sure you know how  and at what times they should be taken. Taking two medicines at the same time can result in blood pressure that is too low. SEEK IMMEDIATE MEDICAL CARE IF:  You develop a severe headache, blurred or changing vision, or confusion.  You have unusual weakness or numbness, or a faint feeling.  You have severe chest or abdominal pain, vomiting, or breathing problems. MAKE SURE  YOU:   Understand these instructions.  Will watch your condition.  Will get help right away if you are not doing well or get worse. Document Released: 09/03/2005 Document Revised: 11/26/2011 Document Reviewed: 04/23/2008 [facility] Patient Information 2014 [place].

## 2013-11-11 NOTE — Progress Notes (Signed)
Pre visit review using our clinic review tool, if applicable. No additional management support is needed unless otherwise documented below in the visit note/SLS  

## 2013-11-11 NOTE — Progress Notes (Signed)
Patient presents to clinic today to establish care.  Acute Concerns: No acute concerns today.  Does not need medication refills at present time.  Chronic Issues: Hypertension -- Currently on cozaar 100 mg daily.  BP 130/90 in clinic.  Has not taken medication yet today.  Patient takes aspirin 325 daily.  Denies headache, vision changes, chest pain, shortness of breath, palpitations.  Denies history of MI.  CVA in 2008.  No residual weakness.  Hyperlipidemia -- Patient currently on Zocor 20 mg.  Denies myalgias.  Will need to obtain records from previous PCP.  Thinks he has had a Medicare Wellness Exam recently.  Hx of Nephrolithiasis -- no symptoms currently.  Patient has Rx for recent nephrolithiasis. Is set up with Urology -- Dr. Jonette Eva with [facility].  Is currently having a stone study performed.  Endorses history of calcium.  Also has history of BPH.  Denies ever having surgery on his prostate.  Recent PSA from Dr. Nevada Crane within normal limits.   BPH -- Followed by Urology  Depression -- Celexa 20 mg daily for depression symptoms.  Has history of CVA in 2008.  Depression worsening after that time.  Endorses anxiety s/p CVA.  Has history of panic attacks, none currently.  Has old prescription for Ativan at home.  Has not taken a pill in several months.  Depressive symptoms well controlled with current dose of medications.  Denies suicidal thought or ideation.  Patient stays active.  Seasonal Allergies -- symptoms year-round.  Daily Claritin and nasal spray (unsure of name).  Endorses occasional alternation to smell.  Has seen ENT, but many years in the past.  DJD Right Hand -- Followed by Sports Medicine, Dr. Barbaraann Barthel.  Has follow-up scheduled.  GERD -- Takes omeprazole daily.  Endorses good relief of symptoms with medication.  Denies epigastric pain or history of hiatal hernia.  IBS -- endorses history of multiple upper endoscopies and colonoscopies.  Is currently on a gluten free diet.  Has  noticed a marked improvement with removal of gluten from diet.  Erectile Dysfunction -- given Viagra by Urology with good improvement of symptoms.   Health Maintenance: Dental -- UTD Vision -- Sees Ophthalmology.  Last visit 6 months ago -- early cataracts.  Has follow-up in 6 months. Immunizations -- Overdue for Tetanus and Pneumonia. Colonoscopy -- + family history of CRC in maternal uncle.  Last colonoscopy in 2014; no abnormal findings.  Past Medical History  Diagnosis Date  . Kidney stones   . CVA (cerebral infarction)   . Depression   . Hyperlipemia   . Hypertension   . GERD (gastroesophageal reflux disease)   . Chicken pox   . Seasonal allergies     some asthma    Past Surgical History  Procedure Laterality Date  . Lithotripsy    . Wisdom tooth extraction      Current Outpatient Prescriptions on File Prior to Visit  Medication Sig Dispense Refill  . Cetirizine HCl (ZYRTEC ALLERGY PO) Take 10 mg by mouth daily.       . Citalopram Hydrobromide (CELEXA PO) Take 20 mg by mouth daily.       Marland Kitchen ibuprofen (ADVIL,MOTRIN) 400 MG tablet Take 1 tablet (400 mg total) by mouth every 6 (six) hours as needed.  30 tablet  0  . Multiple Vitamins-Minerals (MULTIVITAMINS THER. W/MINERALS) TABS tablet Take 1 tablet by mouth daily.      Marland Kitchen OMEPRAZOLE PO Take 20 mg by mouth daily.       Marland Kitchen  simvastatin (ZOCOR) 20 MG tablet       . tamsulosin (FLOMAX) 0.4 MG CAPS capsule as needed.        No current facility-administered medications on file prior to visit.    No Known Allergies  Family History  Problem Relation Age of Onset  . Hypertension Mother   . Hyperlipidemia Mother   . Diabetes Sister   . Hyperlipidemia Brother   . Hypertension Brother   . Ulcers Father 36    Bleeding Ulcers  . Prostate cancer Maternal Uncle   . Kidney Stones Daughter   . Asthma Daughter   . Healthy Son   . Arthritis/Rheumatoid Mother   . Fibromyalgia Mother     History   Social History  . Marital  Status: Married    Spouse Name: N/A    Number of Children: N/A  . Years of Education: N/A   Occupational History  . Not on file.   Social History Main Topics  . Smoking status: Never Smoker   . Smokeless tobacco: Never Used  . Alcohol Use: Yes     Comment: 1-2 daily  . Drug Use: No  . Sexual Activity: Yes   Other Topics Concern  . Not on file   Social History Narrative  . No narrative on file    Review of Systems  Constitutional: Negative for fever and weight loss.  HENT: Negative for ear pain, hearing loss and tinnitus.   Eyes: Negative for blurred vision, double vision, photophobia and pain.  Respiratory: Negative for cough, shortness of breath and wheezing.   Cardiovascular: Negative for chest pain and palpitations.  Gastrointestinal: Positive for heartburn. Negative for nausea, vomiting, abdominal pain, diarrhea, constipation, blood in stool and melena.  Genitourinary: Negative for dysuria, urgency, frequency, hematuria and flank pain.       + urinary hesitancy. Nocturia x 1  Musculoskeletal: Negative for myalgias.  Neurological: Negative for dizziness, seizures, loss of consciousness and headaches.  Psychiatric/Behavioral: Positive for depression. Negative for suicidal ideas, hallucinations and substance abuse. The patient is nervous/anxious. The patient does not have insomnia.    BP 130/90  Pulse 68  Temp(Src) 98.1 F (36.7 C) (Oral)  Resp 16  Ht 5\' 8"  (1.727 m)  Wt 183 lb 4 oz (83.122 kg)  BMI 27.87 kg/m2  SpO2 97%  Physical Exam  Vitals reviewed. Constitutional: He is oriented to person, place, and time and well-developed, well-nourished, and in no distress.  HENT:  Head: Normocephalic and atraumatic.  Right Ear: External ear normal.  Left Ear: External ear normal.  Nose: Nose normal.  Mouth/Throat: Oropharynx is clear and moist. No oropharyngeal exudate.  Eyes: Conjunctivae are normal. Pupils are equal, round, and reactive to light.  Neck: Neck supple.  No thyromegaly present.  Cardiovascular: Normal rate, regular rhythm, normal heart sounds and intact distal pulses.   Pulmonary/Chest: Effort normal and breath sounds normal. No respiratory distress. He has no wheezes. He has no rales. He exhibits no tenderness.  Lymphadenopathy:    He has no cervical adenopathy.  Neurological: He is alert and oriented to person, place, and time.  Skin: Skin is warm and dry. No rash noted.  Psychiatric: Affect normal.    No results found for this or any previous visit (from the past 2160 hour(s)).  Assessment/Plan: Essential hypertension, benign Stable. Asymptomatic.  Continue current regimen.  Will obtain records from previous PCP.  BPH (benign prostatic hyperplasia) Followed by Urology -- at [facility]  Recurrent nephrolithiasis Followed now by Urology at  [facility] -- Dr. Jonette Eva.  Stone study is currently being performed.   Need for prophylactic vaccination with combined diphtheria-tetanus-pertussis (DTP) vaccine Vaccination given by nursing staff.  Encounter to establish care Medical history reviewed.  DTP vaccination given.  Patient defers pneumonia vaccination until next visit.  Patient to return to clinic for Medicare Wellness visit with fasting labs. Last colonoscopy in 2014 w/o abnormal findings.  Depression Well-controlled with Celexa.  No SI/HI.  Continue current regimen.  Other and unspecified hyperlipidemia Patient currently on Zocor.  Denies myalgias of hx of abnormal liver function.  Patient to return for [facility] and labs.

## 2013-11-13 ENCOUNTER — Telehealth: Payer: Self-pay | Admitting: Physician Assistant

## 2013-11-13 NOTE — Telephone Encounter (Signed)
Received medical records from [facility]

## 2013-11-15 ENCOUNTER — Encounter: Payer: Self-pay | Admitting: Physician Assistant

## 2013-11-15 DIAGNOSIS — E785 Hyperlipidemia, unspecified: Secondary | ICD-10-CM | POA: Insufficient documentation

## 2013-11-15 DIAGNOSIS — Z7689 Persons encountering health services in other specified circumstances: Secondary | ICD-10-CM | POA: Insufficient documentation

## 2013-11-15 DIAGNOSIS — Z23 Encounter for immunization: Secondary | ICD-10-CM | POA: Insufficient documentation

## 2013-11-15 DIAGNOSIS — I10 Essential (primary) hypertension: Secondary | ICD-10-CM | POA: Insufficient documentation

## 2013-11-15 DIAGNOSIS — R430 Anosmia: Secondary | ICD-10-CM | POA: Insufficient documentation

## 2013-11-15 DIAGNOSIS — F418 Other specified anxiety disorders: Secondary | ICD-10-CM | POA: Insufficient documentation

## 2013-11-15 DIAGNOSIS — N200 Calculus of kidney: Secondary | ICD-10-CM | POA: Insufficient documentation

## 2013-11-15 DIAGNOSIS — Z8673 Personal history of transient ischemic attack (TIA), and cerebral infarction without residual deficits: Secondary | ICD-10-CM | POA: Insufficient documentation

## 2013-11-15 DIAGNOSIS — N400 Enlarged prostate without lower urinary tract symptoms: Secondary | ICD-10-CM | POA: Insufficient documentation

## 2013-11-15 NOTE — Assessment & Plan Note (Signed)
Medical history reviewed.  DTP vaccination given.  Patient defers pneumonia vaccination until next visit.  Patient to return to clinic for Medicare Wellness visit with fasting labs. Last colonoscopy in 2014 w/o abnormal findings.

## 2013-11-15 NOTE — Assessment & Plan Note (Signed)
Stable. Asymptomatic.  Continue current regimen.  Will obtain records from previous PCP.

## 2013-11-15 NOTE — Assessment & Plan Note (Signed)
Followed by Urology at [facility]. 

## 2013-11-15 NOTE — Assessment & Plan Note (Signed)
Followed now by Urology at [facility] -- Dr. Jonette Eva.  Stone study is currently being performed.

## 2013-11-15 NOTE — Assessment & Plan Note (Signed)
Well-controlled with Celexa.  No SI/HI.  Continue current regimen.

## 2013-11-15 NOTE — Assessment & Plan Note (Signed)
Patient currently on Zocor.  Denies myalgias of hx of abnormal liver function.  Patient to return for [facility] and labs.

## 2013-11-15 NOTE — Assessment & Plan Note (Signed)
Vaccination given by nursing staff. 

## 2013-11-16 DIAGNOSIS — N200 Calculus of kidney: Secondary | ICD-10-CM | POA: Diagnosis not present

## 2013-11-17 DIAGNOSIS — N200 Calculus of kidney: Secondary | ICD-10-CM | POA: Diagnosis not present

## 2013-12-02 ENCOUNTER — Telehealth: Payer: Self-pay | Admitting: *Deleted

## 2013-12-02 MED ORDER — LOSARTAN POTASSIUM 100 MG PO TABS: 100.0000 mg | ORAL_TABLET | Freq: Every day | ORAL | Status: DC

## 2013-12-02 NOTE — Telephone Encounter (Signed)
Received message from pt requesting 90 day supply of losartan. Also states that he left a message for a return call 1 week ago re: sinus problem and nose spray and did not receive return call.  Spoke with pt and advised refill has been sent. Questioned pt re: previous message and he states he was supposed to call us back and let us know what he had been using for his nose spray.  He states he will address this with Provider at his upcoming appt. I apologized to pt but could not find record of previous call/message.

## 2013-12-10 ENCOUNTER — Encounter: Payer: Self-pay | Admitting: Physician Assistant

## 2013-12-10 ENCOUNTER — Ambulatory Visit (INDEPENDENT_AMBULATORY_CARE_PROVIDER_SITE_OTHER): Payer: Medicare Other | Admitting: Physician Assistant

## 2013-12-10 VITALS — BP 140/92 | HR 97 | Temp 98.1°F | Resp 16 | Wt 180.0 lb

## 2013-12-10 DIAGNOSIS — H6090 Unspecified otitis externa, unspecified ear: Secondary | ICD-10-CM

## 2013-12-10 DIAGNOSIS — H6690 Otitis media, unspecified, unspecified ear: Secondary | ICD-10-CM

## 2013-12-10 DIAGNOSIS — I10 Essential (primary) hypertension: Secondary | ICD-10-CM

## 2013-12-10 HISTORY — DX: Unspecified otitis externa, unspecified ear: H60.90

## 2013-12-10 MED ORDER — AMOXICILLIN 875 MG PO TABS: 875.0000 mg | ORAL_TABLET | Freq: Two times a day (BID) | ORAL | Status: DC

## 2013-12-10 NOTE — Patient Instructions (Signed)
Please take antibiotic as prescribed with food.  Use Flonase and Zyrtec daily.  Read information below on the DASH diet.  Increase aerobic exercise and monitor salt intake. Follow-up in 2 months for BP recheck.  DASH Diet The DASH diet stands for "Dietary Approaches to Stop Hypertension." It is a healthy eating plan that has been shown to reduce high blood pressure (hypertension) in as little as 14 days, while also possibly providing other significant health benefits. These other health benefits include reducing the risk of breast cancer after menopause and reducing the risk of type 2 diabetes, heart disease, colon cancer, and stroke. Health benefits also include weight loss and slowing kidney failure in patients with chronic kidney disease.  DIET GUIDELINES  Limit salt (sodium). Your diet should contain less than 1500 mg of sodium daily.  Limit refined or processed carbohydrates. Your diet should include mostly whole grains. Desserts and added sugars should be used sparingly.  Include small amounts of heart-healthy fats. These types of fats include nuts, oils, and tub margarine. Limit saturated and trans fats. These fats have been shown to be harmful in the body. CHOOSING FOODS  The following food groups are based on a 2000 calorie diet. See your Registered Dietitian for individual calorie needs. Grains and Grain Products (6 to 8 servings daily)  Eat More Often: Whole-wheat bread, brown rice, whole-grain or wheat pasta, quinoa, popcorn without added fat or salt (air popped).  Eat Less Often: White bread, white pasta, white rice, cornbread. Vegetables (4 to 5 servings daily)  Eat More Often: Fresh, frozen, and canned vegetables. Vegetables may be raw, steamed, roasted, or grilled with a minimal amount of fat.  Eat Less Often/Avoid: Creamed or fried vegetables. Vegetables in a cheese sauce. Fruit (4 to 5 servings daily)  Eat More Often: All fresh, canned (in natural juice), or frozen fruits.  Dried fruits without added sugar. One hundred percent fruit juice ( cup [237 mL] daily).  Eat Less Often: Dried fruits with added sugar. Canned fruit in light or heavy syrup. YUM! Brands, Fish, and Poultry (2 servings or less daily. One serving is 3 to 4 oz [85-114 g]).  Eat More Often: Ninety percent or leaner ground beef, tenderloin, sirloin. Round cuts of beef, chicken breast, [place] breast. All fish. Grill, bake, or broil your meat. Nothing should be fried.  Eat Less Often/Avoid: Fatty cuts of meat, [place], or chicken leg, thigh, or wing. Fried cuts of meat or fish. Dairy (2 to 3 servings)  Eat More Often: Low-fat or fat-free milk, low-fat plain or light yogurt, reduced-fat or part-skim cheese.  Eat Less Often/Avoid: Milk (whole, 2%).Whole milk yogurt. Full-fat cheeses. Nuts, Seeds, and Legumes (4 to 5 servings per week)  Eat More Often: All without added salt.  Eat Less Often/Avoid: Salted nuts and seeds, canned beans with added salt. Fats and Sweets (limited)  Eat More Often: Vegetable oils, tub margarines without trans fats, sugar-free gelatin. Mayonnaise and salad dressings.  Eat Less Often/Avoid: Coconut oils, palm oils, butter, stick margarine, cream, half and half, cookies, candy, pie. FOR MORE INFORMATION The Dash Diet Eating Plan: www.dashdiet.org Document Released: 08/23/2011 Document Revised: 11/26/2011 Document Reviewed: 08/23/2011 [facility] Patient Information 2014 [place], [place].

## 2013-12-10 NOTE — Assessment & Plan Note (Signed)
BP improved on recheck.  Will continue current regimen.  Encouraged exercise and weight loss.  Monitor salt intake.  DASH diet given.  Follow-up in 2 months for BP recheck.  If still above goal, will need additional antihypertensive agent.

## 2013-12-10 NOTE — Assessment & Plan Note (Signed)
Rx Amoxicillin.  Continue Flonase.  Restart Zyrtec.

## 2013-12-10 NOTE — Progress Notes (Signed)
Pre-visit discussion using our clinic review tool. No additional management support is needed unless otherwise documented below in the visit note.  

## 2013-12-10 NOTE — Progress Notes (Signed)
Patient presents to clinic today for follow-up of HTN and with c/o ear pain.  HTN -- Patient endorses taking medication daily.  BP has been good at home.  Patient agitated due to wait today.  Initial BP is elevated.  BP recheck is much improved.  Still slightly above goal.  Denies chest pain, palpiations, LH, dizziness, vision changes or headache.  Ear Pain -- Patient with continued ear pressure and pain bilaterally with R>L.  Denies sinus pressure, sinus pain, headache or fever.  Does have history of moderate seasonal allergies.  Has not been taking Zyrtec.  IS taking Flonase most days.  Past Medical History  Diagnosis Date  . Kidney stones   . CVA (cerebral infarction)   . Depression   . Hyperlipemia   . Hypertension   . GERD (gastroesophageal reflux disease)   . Chicken pox   . Seasonal allergies     some asthma    Current Outpatient Prescriptions on File Prior to Visit  Medication Sig Dispense Refill  . aspirin 325 MG tablet Take 325 mg by mouth daily.      . Cetirizine HCl (ZYRTEC ALLERGY PO) Take 10 mg by mouth daily.       . Cholecalciferol (VITAMIN D) 2000 UNITS CAPS Take 2,000 Units by mouth daily.      . Citalopram Hydrobromide (CELEXA PO) Take 20 mg by mouth daily.       . Flaxseed, Linseed, (FLAXSEED OIL PO) Take 1,000 mg by mouth daily.      Marland Kitchen ibuprofen (ADVIL,MOTRIN) 400 MG tablet Take 1 tablet (400 mg total) by mouth every 6 (six) hours as needed.  30 tablet  0  . losartan (COZAAR) 100 MG tablet Take 1 tablet (100 mg total) by mouth daily.  90 tablet  0  . Multiple Vitamins-Minerals (MULTIVITAMINS THER. W/MINERALS) TABS tablet Take 1 tablet by mouth daily.      . Omega-3 Fatty Acids (OMEGA-3 FISH OIL) 1200 MG CAPS Take 1,200 mg by mouth daily.      Marland Kitchen OMEPRAZOLE PO Take 20 mg by mouth daily.       . simvastatin (ZOCOR) 20 MG tablet       . tamsulosin (FLOMAX) 0.4 MG CAPS capsule as needed.        No current facility-administered medications on file prior to visit.     No Known Allergies  Family History  Problem Relation Age of Onset  . Hypertension Mother   . Hyperlipidemia Mother   . Diabetes Sister   . Hyperlipidemia Brother   . Hypertension Brother   . Ulcers Father 36    Bleeding Ulcers  . Prostate cancer Maternal Uncle   . Kidney Stones Daughter   . Asthma Daughter   . Healthy Son   . Arthritis/Rheumatoid Mother   . Fibromyalgia Mother     History   Social History  . Marital Status: Married    Spouse Name: N/A    Number of Children: N/A  . Years of Education: N/A   Social History Main Topics  . Smoking status: Never Smoker   . Smokeless tobacco: Never Used  . Alcohol Use: Yes     Comment: 1-2 daily  . Drug Use: No  . Sexual Activity: Yes   Other Topics Concern  . None   Social History Narrative  . None   Review of Systems - See HPI.  All other ROS are negative.  BP 140/92  Pulse 97  Temp(Src) 98.1 F (36.7 C) (Oral)  Resp 16  Wt 180 lb (81.647 kg)  SpO2 98%  Physical Exam  Vitals reviewed. Constitutional: He is oriented to person, place, and time and well-developed, well-nourished, and in no distress.  HENT:  Head: Normocephalic and atraumatic.  Right Ear: External ear normal.  Left Ear: External ear normal.  Nose: Nose normal.  Mouth/Throat: Oropharynx is clear and moist. No oropharyngeal exudate.  R TM dull, and retracted with some erythema.  L TM dull and retracted without erythema.  Eyes: Conjunctivae are normal. Pupils are equal, round, and reactive to light.  Neck: Neck supple.  Cardiovascular: Normal rate, regular rhythm, normal heart sounds and intact distal pulses.   Pulmonary/Chest: Effort normal and breath sounds normal. No respiratory distress. He has no wheezes. He has no rales. He exhibits no tenderness.  Lymphadenopathy:    He has no cervical adenopathy.  Neurological: He is alert and oriented to person, place, and time.  Skin: Skin is warm and dry. No rash noted.  Psychiatric: Affect  normal.    No results found for this or any previous visit (from the past 2160 hour(s)).  Assessment/Plan: AOM (acute otitis media) Rx Amoxicillin.  Continue Flonase.  Restart Zyrtec.  Essential hypertension, benign BP improved on recheck.  Will continue current regimen.  Encouraged exercise and weight loss.  Monitor salt intake.  DASH diet given.  Follow-up in 2 months for BP recheck.  If still above goal, will need additional antihypertensive agent.

## 2013-12-11 ENCOUNTER — Telehealth: Payer: Self-pay | Admitting: Physician Assistant

## 2013-12-11 NOTE — Telephone Encounter (Signed)
Relevant patient education assigned to patient using Emmi. ° °

## 2013-12-14 DIAGNOSIS — R109 Unspecified abdominal pain: Secondary | ICD-10-CM | POA: Diagnosis not present

## 2013-12-16 DIAGNOSIS — N200 Calculus of kidney: Secondary | ICD-10-CM | POA: Diagnosis not present

## 2013-12-17 ENCOUNTER — Ambulatory Visit: Payer: Medicare Other | Admitting: Family Medicine

## 2013-12-17 ENCOUNTER — Ambulatory Visit: Payer: Medicare Other | Admitting: Physician Assistant

## 2013-12-23 DIAGNOSIS — R350 Frequency of micturition: Secondary | ICD-10-CM | POA: Diagnosis not present

## 2013-12-23 DIAGNOSIS — N200 Calculus of kidney: Secondary | ICD-10-CM | POA: Diagnosis not present

## 2013-12-29 ENCOUNTER — Telehealth: Payer: Self-pay | Admitting: Family Medicine

## 2013-12-29 ENCOUNTER — Ambulatory Visit (INDEPENDENT_AMBULATORY_CARE_PROVIDER_SITE_OTHER): Payer: Medicare Other | Admitting: Family Medicine

## 2013-12-29 ENCOUNTER — Encounter: Payer: Self-pay | Admitting: Family Medicine

## 2013-12-29 ENCOUNTER — Other Ambulatory Visit: Payer: Self-pay | Admitting: Family Medicine

## 2013-12-29 ENCOUNTER — Telehealth: Payer: Self-pay

## 2013-12-29 VITALS — BP 124/70 | HR 76 | Temp 98.3°F | Ht 68.0 in | Wt 181.0 lb

## 2013-12-29 DIAGNOSIS — J45909 Unspecified asthma, uncomplicated: Secondary | ICD-10-CM | POA: Diagnosis not present

## 2013-12-29 DIAGNOSIS — F329 Major depressive disorder, single episode, unspecified: Secondary | ICD-10-CM

## 2013-12-29 DIAGNOSIS — I10 Essential (primary) hypertension: Secondary | ICD-10-CM | POA: Diagnosis not present

## 2013-12-29 DIAGNOSIS — F3289 Other specified depressive episodes: Secondary | ICD-10-CM | POA: Diagnosis not present

## 2013-12-29 DIAGNOSIS — H6690 Otitis media, unspecified, unspecified ear: Secondary | ICD-10-CM

## 2013-12-29 DIAGNOSIS — F32A Depression, unspecified: Secondary | ICD-10-CM

## 2013-12-29 DIAGNOSIS — T7840XA Allergy, unspecified, initial encounter: Secondary | ICD-10-CM

## 2013-12-29 DIAGNOSIS — Z8673 Personal history of transient ischemic attack (TIA), and cerebral infarction without residual deficits: Secondary | ICD-10-CM | POA: Diagnosis not present

## 2013-12-29 DIAGNOSIS — E785 Hyperlipidemia, unspecified: Secondary | ICD-10-CM | POA: Diagnosis not present

## 2013-12-29 DIAGNOSIS — K219 Gastro-esophageal reflux disease without esophagitis: Secondary | ICD-10-CM

## 2013-12-29 HISTORY — DX: Allergy, unspecified, initial encounter: T78.40XA

## 2013-12-29 HISTORY — DX: Unspecified asthma, uncomplicated: J45.909

## 2013-12-29 LAB — CBC
HEMATOCRIT: 38.8 % — AB (ref 39.0–52.0)
Hemoglobin: 13.7 g/dL (ref 13.0–17.0)
MCH: 32.2 pg (ref 26.0–34.0)
MCHC: 35.3 g/dL (ref 30.0–36.0)
MCV: 91.3 fL (ref 78.0–100.0)
Platelets: 177 10*3/uL (ref 150–400)
RBC: 4.25 MIL/uL (ref 4.22–5.81)
RDW: 12.8 % (ref 11.5–15.5)
WBC: 5.7 10*3/uL (ref 4.0–10.5)

## 2013-12-29 LAB — RENAL FUNCTION PANEL
ALBUMIN: 4.1 g/dL (ref 3.5–5.2)
BUN: 16 mg/dL (ref 6–23)
CALCIUM: 9.7 mg/dL (ref 8.4–10.5)
CO2: 28 meq/L (ref 19–32)
CREATININE: 1.11 mg/dL (ref 0.50–1.35)
Chloride: 101 mEq/L (ref 96–112)
Glucose, Bld: 73 mg/dL (ref 70–99)
Phosphorus: 2.5 mg/dL (ref 2.3–4.6)
Potassium: 4.8 mEq/L (ref 3.5–5.3)
SODIUM: 138 meq/L (ref 135–145)

## 2013-12-29 LAB — LIPID PANEL
Cholesterol: 160 mg/dL (ref 0–200)
HDL: 57 mg/dL (ref >39)
LDL CALC: 78 mg/dL (ref 0–99)
TRIGLYCERIDES: 126 mg/dL (ref <150)
Total CHOL/HDL Ratio: 2.8 Ratio
VLDL: 25 mg/dL (ref 0–40)

## 2013-12-29 LAB — HEPATIC FUNCTION PANEL
ALBUMIN: 4.1 g/dL (ref 3.5–5.2)
ALT: 15 U/L (ref 0–53)
AST: 22 U/L (ref 0–37)
Alkaline Phosphatase: 85 U/L (ref 39–117)
BILIRUBIN TOTAL: 0.8 mg/dL (ref 0.2–1.2)
Bilirubin, Direct: 0.2 mg/dL (ref 0.0–0.3)
Indirect Bilirubin: 0.6 mg/dL (ref 0.2–1.2)
Total Protein: 6.8 g/dL (ref 6.0–8.3)

## 2013-12-29 MED ORDER — ALBUTEROL SULFATE HFA 108 (90 BASE) MCG/ACT IN AERS: 2.0000 | INHALATION_SPRAY | Freq: Four times a day (QID) | RESPIRATORY_TRACT | Status: DC | PRN

## 2013-12-29 MED ORDER — MONTELUKAST SODIUM 10 MG PO TABS: 10.0000 mg | ORAL_TABLET | Freq: Every day | ORAL | Status: DC

## 2013-12-29 MED ORDER — METHYLPREDNISOLONE (PAK) 4 MG PO TABS: ORAL_TABLET | ORAL | Status: DC

## 2013-12-29 MED ORDER — FLUTICASONE PROPIONATE 50 MCG/ACT NA SUSP: 2.0000 | Freq: Every day | NASAL | Status: DC

## 2013-12-29 NOTE — Assessment & Plan Note (Signed)
Recently treated

## 2013-12-29 NOTE — Telephone Encounter (Signed)
Patient called in stating that he has been using Fluticasone propionate 39mcp

## 2013-12-29 NOTE — Patient Instructions (Signed)
Digestive Advantage probiotic daily   DASH Diet The DASH diet stands for "Dietary Approaches to Stop Hypertension." It is a healthy eating plan that has been shown to reduce high blood pressure (hypertension) in as little as 14 days, while also possibly providing other significant health benefits. These other health benefits include reducing the risk of breast cancer after menopause and reducing the risk of type 2 diabetes, heart disease, colon cancer, and stroke. Health benefits also include weight loss and slowing kidney failure in patients with chronic kidney disease.  DIET GUIDELINES  Limit salt (sodium). Your diet should contain less than 1500 mg of sodium daily.  Limit refined or processed carbohydrates. Your diet should include mostly whole grains. Desserts and added sugars should be used sparingly.  Include small amounts of heart-healthy fats. These types of fats include nuts, oils, and tub margarine. Limit saturated and trans fats. These fats have been shown to be harmful in the body. CHOOSING FOODS  The following food groups are based on a 2000 calorie diet. See your Registered Dietitian for individual calorie needs. Grains and Grain Products (6 to 8 servings daily)  Eat More Often: Whole-wheat bread, brown rice, whole-grain or wheat pasta, quinoa, popcorn without added fat or salt (air popped).  Eat Less Often: White bread, white pasta, white rice, cornbread. Vegetables (4 to 5 servings daily)  Eat More Often: Fresh, frozen, and canned vegetables. Vegetables may be raw, steamed, roasted, or grilled with a minimal amount of fat.  Eat Less Often/Avoid: Creamed or fried vegetables. Vegetables in a cheese sauce. Fruit (4 to 5 servings daily)  Eat More Often: All fresh, canned (in natural juice), or frozen fruits. Dried fruits without added sugar. One hundred percent fruit juice ( cup [237 mL] daily).  Eat Less Often: Dried fruits with added sugar. Canned fruit in light or heavy  syrup. YUM! Brands, Fish, and Poultry (2 servings or less daily. One serving is 3 to 4 oz [85-114 g]).  Eat More Often: Ninety percent or leaner ground beef, tenderloin, sirloin. Round cuts of beef, chicken breast, [place] breast. All fish. Grill, bake, or broil your meat. Nothing should be fried.  Eat Less Often/Avoid: Fatty cuts of meat, [place], or chicken leg, thigh, or wing. Fried cuts of meat or fish. Dairy (2 to 3 servings)  Eat More Often: Low-fat or fat-free milk, low-fat plain or light yogurt, reduced-fat or part-skim cheese.  Eat Less Often/Avoid: Milk (whole, 2%).Whole milk yogurt. Full-fat cheeses. Nuts, Seeds, and Legumes (4 to 5 servings per week)  Eat More Often: All without added salt.  Eat Less Often/Avoid: Salted nuts and seeds, canned beans with added salt. Fats and Sweets (limited)  Eat More Often: Vegetable oils, tub margarines without trans fats, sugar-free gelatin. Mayonnaise and salad dressings.  Eat Less Often/Avoid: Coconut oils, palm oils, butter, stick margarine, cream, half and half, cookies, candy, pie. FOR MORE INFORMATION The Dash Diet Eating Plan: www.dashdiet.org Document Released: 08/23/2011 Document Revised: 11/26/2011 Document Reviewed: 08/23/2011 [facility] Patient Information 2014 [place], [place].

## 2013-12-29 NOTE — Telephone Encounter (Signed)
OK but he is on Simvastatin and insurance likes [place] to check it every 6 months when he is on meds

## 2013-12-29 NOTE — Progress Notes (Signed)
Pre visit review using our clinic review tool, if applicable. No additional management support is needed unless otherwise documented below in the visit note. 

## 2013-12-29 NOTE — Progress Notes (Signed)
Patient ID: Paul Drake, male   DOB: 07/23/1946, 68 y.o.   MRN: [id number] Paul Drake [id number] September 12, 1946 12/29/2013      Progress Note-Follow Up  Subjective  Chief Complaint  Chief Complaint  Patient presents with  . Establish Care    new patient/ transfer from [place]    HPI  Patient is a 68 year old male in today for routine medical care. Patient is in today to transfer care and is requesting titer to check and see if he was exposed to chickenpox in childhood. Struggle with allergies at the moment and has a lot of facial pressure and congestion. Says he gets this bad once or twice a year. Steroids when this and he notes he facial pain and some intermittent cough and wheezing are also noted. The long history of recurrent kidney stones. Follows with Dr. Nevada Crane urology. Denies CP/palp/SOB/HA/congestion/fevers/GI or GU c/o. Taking meds as prescribed  Past Medical History  Diagnosis Date  . Kidney stones   . CVA (cerebral infarction)   . Depression   . Hyperlipemia   . Hypertension   . GERD (gastroesophageal reflux disease)   . Chicken pox   . Seasonal allergies     some asthma  . Measles as a child  . Mumps as a child    Past Surgical History  Procedure Laterality Date  . Lithotripsy    . Wisdom tooth extraction      Family History  Problem Relation Age of Onset  . Hypertension Mother   . Hyperlipidemia Mother   . Fibromyalgia Mother   . Arthritis Mother     rheumatoid  . Diabetes Sister     type 2  . Hyperlipidemia Brother   . Hypertension Brother   . Ulcers Father 36    Bleeding Ulcers  . Cancer Maternal Uncle     prostate  . Kidney Stones Daughter   . Asthma Daughter   . Healthy Son   . Cancer Maternal Grandmother 30    ?  Marland Kitchen Cancer Maternal Grandfather     skin ?    History   Social History  . Marital Status: Married    Spouse Name: N/A    Number of Children: N/A  . Years of Education: N/A   Occupational History  . Not on file.   Social  History Main Topics  . Smoking status: Never Smoker   . Smokeless tobacco: Never Used  . Alcohol Use: Yes     Comment: 1-2 daily  . Drug Use: No  . Sexual Activity: Yes   Other Topics Concern  . Not on file   Social History Narrative  . No narrative on file    Current Outpatient Prescriptions on File Prior to Visit  Medication Sig Dispense Refill  . Cetirizine HCl (ZYRTEC ALLERGY PO) Take 10 mg by mouth daily.       . Cholecalciferol (VITAMIN D) 2000 UNITS CAPS Take 2,000 Units by mouth daily.      . Citalopram Hydrobromide (CELEXA PO) Take 10 mg by mouth daily.       . fluticasone (FLONASE) 50 MCG/ACT nasal spray Place into both nostrils daily.      Marland Kitchen ibuprofen (ADVIL,MOTRIN) 400 MG tablet Take 1 tablet (400 mg total) by mouth every 6 (six) hours as needed.  30 tablet  0  . losartan (COZAAR) 100 MG tablet Take 1 tablet (100 mg total) by mouth daily.  90 tablet  0  . Multiple Vitamins-Minerals (MULTIVITAMINS THER. W/MINERALS) TABS  tablet Take 1 tablet by mouth daily.      . Omega-3 Fatty Acids (OMEGA-3 FISH OIL) 1200 MG CAPS Take 1,200 mg by mouth daily.      Marland Kitchen OMEPRAZOLE PO Take 20 mg by mouth daily.       . simvastatin (ZOCOR) 20 MG tablet       . aspirin 325 MG tablet Take 325 mg by mouth daily.       No current facility-administered medications on file prior to visit.    No Known Allergies  Review of Systems  Review of Systems  Constitutional: Positive for malaise/fatigue. Negative for fever and chills.  HENT: Positive for congestion and ear pain. Negative for hearing loss and nosebleeds.   Eyes: Negative for discharge.  Respiratory: Positive for cough, sputum production and wheezing. Negative for shortness of breath.   Cardiovascular: Negative for chest pain, palpitations and leg swelling.  Gastrointestinal: Negative for heartburn, nausea, vomiting, abdominal pain, diarrhea, constipation and blood in stool.  Genitourinary: Negative for dysuria, urgency, frequency and  hematuria.  Musculoskeletal: Positive for myalgias. Negative for back pain and falls.  Skin: Negative for rash.  Neurological: Positive for headaches. Negative for dizziness, tremors, sensory change, focal weakness, loss of consciousness and weakness.  Endo/Heme/Allergies: Negative for polydipsia. Does not bruise/bleed easily.  Psychiatric/Behavioral: Negative for depression and suicidal ideas. The patient is not nervous/anxious and does not have insomnia.     Objective  BP 124/70  Pulse 76  Temp(Src) 98.3 F (36.8 C) (Oral)  Ht 5\' 8"  (1.727 m)  Wt 181 lb (82.101 kg)  BMI 27.53 kg/m2  SpO2 98%  Physical Exam  Physical Exam  Constitutional: He is oriented to person, place, and time and well-developed, well-nourished, and in no distress. No distress.  HENT:  Head: Normocephalic and atraumatic.  Eyes: Conjunctivae are normal.  Neck: Neck supple. No thyromegaly present.  Cardiovascular: Normal rate, regular rhythm and normal heart sounds.   No murmur heard. Pulmonary/Chest: Effort normal and breath sounds normal. No respiratory distress.  Abdominal: He exhibits no distension and no mass. There is no tenderness.  Musculoskeletal: He exhibits no edema.  Neurological: He is alert and oriented to person, place, and time.  Skin: Skin is warm.  Psychiatric: Memory, affect and judgment normal.     Assessment & Plan Allergic state Change from Zyrtec D to Zyrtec 10 mg daily to bid. Add Singulair daily, continue Flonase and use Nasal saline prn. Report if no improvement. Given a burst of steroids  AOM (acute otitis media) Recently treated  Essential hypertension, benign Well controlled, no changes to meds. Encouraged heart healthy diet such as the DASH diet and exercise as tolerated.   GERD (gastroesophageal reflux disease) Avoid offending foods, start probiotics. Do not eat large meals in late evening and consider raising head of bed.   Other and unspecified  hyperlipidemia Tolerating statin, encouraged heart healthy diet, avoid trans fats, minimize simple carbs and saturated fats. Increase exercise as tolerated

## 2013-12-29 NOTE — Telephone Encounter (Signed)
FYI:  [name] w/[facility] stated that pt wanted to cancel the lipid labs until he checked with MD?

## 2013-12-29 NOTE — Assessment & Plan Note (Addendum)
Change from Zyrtec D to Zyrtec 10 mg daily to bid. Add Singulair daily, continue Flonase and use Nasal saline prn. Report if no improvement. Given a burst of steroids

## 2013-12-30 DIAGNOSIS — N200 Calculus of kidney: Secondary | ICD-10-CM | POA: Diagnosis not present

## 2013-12-30 LAB — TSH: TSH: 3.087 u[IU]/mL (ref 0.350–4.500)

## 2013-12-30 NOTE — Telephone Encounter (Signed)
Notified pt of below instruction and lab results per 12/30/13 lab note.

## 2013-12-30 NOTE — Telephone Encounter (Signed)
Notified pt and he voices understanding. 

## 2013-12-30 NOTE — Telephone Encounter (Signed)
Notify him Fluticasone is Flonase they are equivalent so he does not need both at the same time.

## 2014-01-03 NOTE — Assessment & Plan Note (Signed)
Tolerating statin, encouraged heart healthy diet, avoid trans fats, minimize simple carbs and saturated fats. Increase exercise as tolerated 

## 2014-01-03 NOTE — Assessment & Plan Note (Signed)
Avoid offending foods, start probiotics. Do not eat large meals in late evening and consider raising head of bed.  

## 2014-01-03 NOTE — Assessment & Plan Note (Signed)
Well controlled, no changes to meds. Encouraged heart healthy diet such as the DASH diet and exercise as tolerated.  °

## 2014-01-06 DIAGNOSIS — N401 Enlarged prostate with lower urinary tract symptoms: Secondary | ICD-10-CM | POA: Diagnosis not present

## 2014-01-06 DIAGNOSIS — N200 Calculus of kidney: Secondary | ICD-10-CM | POA: Diagnosis not present

## 2014-01-06 DIAGNOSIS — N138 Other obstructive and reflux uropathy: Secondary | ICD-10-CM | POA: Diagnosis not present

## 2014-01-06 DIAGNOSIS — N23 Unspecified renal colic: Secondary | ICD-10-CM | POA: Diagnosis not present

## 2014-01-07 DIAGNOSIS — N200 Calculus of kidney: Secondary | ICD-10-CM | POA: Diagnosis not present

## 2014-01-07 DIAGNOSIS — R109 Unspecified abdominal pain: Secondary | ICD-10-CM | POA: Diagnosis not present

## 2014-01-07 DIAGNOSIS — R319 Hematuria, unspecified: Secondary | ICD-10-CM | POA: Diagnosis not present

## 2014-01-12 DIAGNOSIS — N200 Calculus of kidney: Secondary | ICD-10-CM | POA: Diagnosis not present

## 2014-01-19 ENCOUNTER — Ambulatory Visit: Payer: Medicare Other | Admitting: Family Medicine

## 2014-01-21 ENCOUNTER — Encounter: Payer: Self-pay | Admitting: Family Medicine

## 2014-01-21 ENCOUNTER — Ambulatory Visit (INDEPENDENT_AMBULATORY_CARE_PROVIDER_SITE_OTHER): Payer: Medicare Other | Admitting: Family Medicine

## 2014-01-21 VITALS — BP 106/64 | HR 67 | Temp 97.9°F | Ht 68.0 in | Wt 175.0 lb

## 2014-01-21 DIAGNOSIS — I10 Essential (primary) hypertension: Secondary | ICD-10-CM | POA: Diagnosis not present

## 2014-01-21 DIAGNOSIS — K219 Gastro-esophageal reflux disease without esophagitis: Secondary | ICD-10-CM | POA: Diagnosis not present

## 2014-01-21 DIAGNOSIS — N200 Calculus of kidney: Secondary | ICD-10-CM

## 2014-01-21 DIAGNOSIS — R3915 Urgency of urination: Secondary | ICD-10-CM

## 2014-01-21 MED ORDER — HYDROCHLOROTHIAZIDE 12.5 MG PO CAPS: 12.5000 mg | ORAL_CAPSULE | Freq: Every day | ORAL | Status: DC

## 2014-01-21 NOTE — Assessment & Plan Note (Signed)
Avoid offending foods, start probiotics. Do not eat large meals in late evening and consider raising head of bed.  

## 2014-01-21 NOTE — Assessment & Plan Note (Signed)
Check UA c&s, maintain hydration and start HCTZ, report worsening symptoms

## 2014-01-21 NOTE — Progress Notes (Signed)
Pre visit review using our clinic review tool, if applicable. No additional management support is needed unless otherwise documented below in the visit note. 

## 2014-01-21 NOTE — Progress Notes (Signed)
Patient ID: Paul Drake, male   DOB: May 06, 1946, 68 y.o.   MRN: [id number] Eliam Snapp [id number] Dec 20, 1945 01/21/2014      Progress Note-Follow Up  Subjective  Chief Complaint  Chief Complaint  Patient presents with  . Nephrolithiasis    has 12 kidney stones-3 are small    HPI  Patient is a 68 year old male in today for routine medical care. He is here today to discuss kidney stones. They are recurrent and Paul-standing but recently become more symptomatic. He has been referred to a specialist in [facility]. Is struggling with urinary urgency but no dysuria. Has frequency but has significantly increased his fluid intake to roughly 2.5 L daily. No fevers, chills, flank pain or abdominal pain. No other acute complaints. Denies CP/palp/SOB/HA/congestion/fevers/GI or GU c/o. Taking meds as prescribed   Past Medical History  Diagnosis Date  . Kidney stones   . CVA (cerebral infarction)   . Depression   . Hyperlipemia   . Hypertension   . Chicken pox   . Seasonal allergies     some asthma  . Measles as a child  . Mumps as a child  . Allergic state 12/29/2013  . Unspecified asthma(493.90) 12/29/2013  . GERD (gastroesophageal reflux disease)   . Arthritis     Past Surgical History  Procedure Laterality Date  . Lithotripsy    . Wisdom tooth extraction      Family History  Problem Relation Age of Onset  . Hypertension Mother   . Hyperlipidemia Mother   . Fibromyalgia Mother   . Arthritis Mother     rheumatoid  . Diabetes Sister     type 2  . Hyperlipidemia Brother   . Hypertension Brother   . Ulcers Father 36    Bleeding Ulcers  . Cancer Maternal Uncle     prostate  . Kidney Stones Daughter   . Asthma Daughter   . Healthy Son   . Cancer Maternal Grandmother 30    ?  Marland Kitchen Cancer Maternal Grandfather     skin ?    History   Social History  . Marital Status: Married    Spouse Name: N/A    Number of Children: N/A  . Years of Education: N/A    Occupational History  . Not on file.   Social History Main Topics  . Smoking status: Never Smoker   . Smokeless tobacco: Never Used  . Alcohol Use: Yes     Comment: 1-2 daily  . Drug Use: No  . 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 Not on file   Social History Narrative  . No narrative on file    Current Outpatient Prescriptions on File Prior to Visit  Medication Sig Dispense Refill  . albuterol (PROVENTIL HFA;VENTOLIN HFA) 108 (90 BASE) MCG/ACT inhaler Inhale 2 puffs into the lungs every 6 (six) hours as needed for wheezing or shortness of breath.  1 Inhaler  3  . aspirin 325 MG tablet Take 325 mg by mouth daily.      . Cetirizine HCl (ZYRTEC ALLERGY PO) Take 10 mg by mouth daily.       . Cholecalciferol (VITAMIN D) 2000 UNITS CAPS Take 2,000 Units by mouth daily.      . Citalopram Hydrobromide (CELEXA PO) Take 10 mg by mouth daily.       . fluticasone (FLONASE) 50 MCG/ACT nasal spray Place  into both nostrils daily.      . fluticasone (FLONASE) 50 MCG/ACT nasal spray Place 2 sprays into both nostrils daily.  16 g  5  . ibuprofen (ADVIL,MOTRIN) 400 MG tablet Take 1 tablet (400 mg total) by mouth every 6 (six) hours as needed.  30 tablet  0  . losartan (COZAAR) 100 MG tablet Take 1 tablet (100 mg total) by mouth daily.  90 tablet  0  . methylPREDNIsolone (MEDROL DOSPACK) 4 MG tablet follow package directions  21 tablet  1  . montelukast (SINGULAIR) 10 MG tablet Take 1 tablet (10 mg total) by mouth at bedtime.  30 tablet  5  . Multiple Vitamins-Minerals (MULTIVITAMINS THER. W/MINERALS) TABS tablet Take 1 tablet by mouth daily.      . Omega-3 Fatty Acids (OMEGA-3 FISH OIL) 1200 MG CAPS Take 1,200 mg by mouth daily.      Marland Kitchen OMEPRAZOLE PO Take 20 mg by mouth daily.       . Potassium Citrate 15 MEQ (1620 MG) TBCR Take 1 tablet by mouth 2 (two) times daily.      . simvastatin (ZOCOR) 20 MG tablet         No current facility-administered medications on file prior to visit.    No Known Allergies  Review of Systems  Review of Systems  Constitutional: Negative for fever and malaise/fatigue.  HENT: Negative for congestion.   Eyes: Negative for discharge.  Respiratory: Negative for shortness of breath.   Cardiovascular: Negative for chest pain, palpitations and leg swelling.  Gastrointestinal: Negative for nausea, abdominal pain and diarrhea.  Genitourinary: Negative for dysuria.  Musculoskeletal: Negative for falls.  Skin: Negative for rash.  Neurological: Negative for loss of consciousness and headaches.  Endo/Heme/Allergies: Negative for polydipsia.  Psychiatric/Behavioral: Negative for depression and suicidal ideas. The patient is not nervous/anxious and does not have insomnia.     Objective  BP 106/64  Pulse 67  Temp(Src) 97.9 F (36.6 C) (Oral)  Ht 5\' 8"  (1.727 m)  Wt 175 lb 0.6 oz (79.398 kg)  BMI 26.62 kg/m2  SpO2 96%  Physical Exam  Physical Exam  Constitutional: He is oriented to person, place, and time and well-developed, well-nourished, and in no distress. No distress.  HENT:  Head: Normocephalic and atraumatic.  Eyes: Conjunctivae are normal.  Neck: Neck supple. No thyromegaly present.  Cardiovascular: Normal rate, regular rhythm and normal heart sounds.   No murmur heard. Pulmonary/Chest: Effort normal and breath sounds normal. No respiratory distress.  Abdominal: He exhibits no distension and no mass. There is no tenderness.  Musculoskeletal: He exhibits no edema.  Neurological: He is alert and oriented to person, place, and time.  Skin: Skin is warm.  Psychiatric: Memory, affect and judgment normal.    Lab Results  Component Value Date   TSH 3.087 12/29/2013   Lab Results  Component Value Date   WBC 5.7 12/29/2013   HGB 13.7 12/29/2013   HCT 38.8* 12/29/2013   MCV 91.3 12/29/2013   PLT 177 12/29/2013   Lab Results  Component Value Date    CREATININE 1.11 12/29/2013   BUN 16 12/29/2013   NA 138 12/29/2013   K 4.8 12/29/2013   CL 101 12/29/2013   CO2 28 12/29/2013   Lab Results  Component Value Date   ALT 15 12/29/2013   AST 22 12/29/2013   ALKPHOS 85 12/29/2013   BILITOT 0.8 12/29/2013   Lab Results  Component Value Date   CHOL 160 12/29/2013  Lab Results  Component Value Date   HDL 57 12/29/2013   Lab Results  Component Value Date   LDLCALC 78 12/29/2013   Lab Results  Component Value Date   TRIG 126 12/29/2013   Lab Results  Component Value Date   CHOLHDL 2.8 12/29/2013     Assessment & Plan   Essential hypertension, benign Well controlled, add HCTZ to control stones  GERD (gastroesophageal reflux disease) Avoid offending foods, start probiotics. Do not eat large meals in late evening and consider raising head of bed.   Recurrent nephrolithiasis Check UA c&s, maintain hydration and start HCTZ, report worsening symptoms

## 2014-01-21 NOTE — Assessment & Plan Note (Signed)
Well controlled, add HCTZ to control stones

## 2014-01-21 NOTE — Patient Instructions (Signed)
Probiotics such as Digestive Advantage or Phillip's Colon Health  Kidney Stones Kidney stones (urolithiasis) are deposits that form inside your kidneys. The intense pain is caused by the stone moving through the urinary tract. When the stone moves, the ureter goes into spasm around the stone. The stone is usually passed in the urine.  CAUSES   A disorder that makes certain neck glands produce too much parathyroid hormone (primary hyperparathyroidism).  A buildup of uric acid crystals, similar to gout in your joints.  Narrowing (stricture) of the ureter.  A kidney obstruction present at birth (congenital obstruction).  Previous surgery on the kidney or ureters.  Numerous kidney infections. SYMPTOMS   Feeling sick to your stomach (nauseous).  Throwing up (vomiting).  Blood in the urine (hematuria).  Pain that usually spreads (radiates) to the groin.  Frequency or urgency of urination. DIAGNOSIS   Taking a history and physical exam.  Blood or urine tests.  CT scan.  Occasionally, an examination of the inside of the urinary bladder (cystoscopy) is performed. TREATMENT   Observation.  Increasing your fluid intake.  Extracorporeal shock wave lithotripsy This is a noninvasive procedure that uses shock waves to break up kidney stones.  Surgery may be needed if you have severe pain or persistent obstruction. There are various surgical procedures. Most of the procedures are performed with the use of small instruments. Only small incisions are needed to accommodate these instruments, so recovery time is minimized. The size, location, and chemical composition are all important variables that will determine the proper choice of action for you. Talk to your health care provider to better understand your situation so that you will minimize the risk of injury to yourself and your kidney.  HOME CARE INSTRUCTIONS   Drink enough water and fluids to keep your urine clear or pale yellow.  This will help you to pass the stone or stone fragments.  Strain all urine through the provided strainer. Keep all particulate matter and stones for your health care provider to see. The stone causing the pain may be as small as a grain of salt. It is very important to use the strainer each and every time you pass your urine. The collection of your stone will allow your health care provider to analyze it and verify that a stone has actually passed. The stone analysis will often identify what you can do to reduce the incidence of recurrences.  Only take over-the-counter or prescription medicines for pain, discomfort, or fever as directed by your health care provider.  Make a follow-up appointment with your health care provider as directed.  Get follow-up X-rays if required. The absence of pain does not always mean that the stone has passed. It may have only stopped moving. If the urine remains completely obstructed, it can cause loss of kidney function or even complete destruction of the kidney. It is your responsibility to make sure X-rays and follow-ups are completed. Ultrasounds of the kidney can show blockages and the status of the kidney. Ultrasounds are not associated with any radiation and can be performed easily in a matter of minutes. SEEK MEDICAL CARE IF:  You experience pain that is progressive and unresponsive to any pain medicine you have been prescribed. SEEK IMMEDIATE MEDICAL CARE IF:   Pain cannot be controlled with the prescribed medicine.  You have a fever or shaking chills.  The severity or intensity of pain increases over 18 hours and is not relieved by pain medicine.  You develop a new  onset of abdominal pain.  You feel faint or pass out.  You are unable to urinate. MAKE SURE YOU:   Understand these instructions.  Will watch your condition.  Will get help right away if you are not doing well or get worse. Document Released: 09/03/2005 Document Revised: 05/06/2013  Document Reviewed: 02/04/2013 [facility] Patient Information 2014 [place].

## 2014-01-22 LAB — URINALYSIS
Bilirubin Urine: NEGATIVE
Glucose, UA: NEGATIVE mg/dL
HGB URINE DIPSTICK: NEGATIVE
Ketones, ur: NEGATIVE mg/dL
NITRITE: NEGATIVE
Protein, ur: NEGATIVE mg/dL
SPECIFIC GRAVITY, URINE: 1.015 (ref 1.005–1.030)
UROBILINOGEN UA: 0.2 mg/dL (ref 0.0–1.0)
pH: 7.5 (ref 5.0–8.0)

## 2014-01-22 LAB — URINE CULTURE
Colony Count: NO GROWTH
Organism ID, Bacteria: NO GROWTH

## 2014-02-04 DIAGNOSIS — N201 Calculus of ureter: Secondary | ICD-10-CM | POA: Diagnosis not present

## 2014-02-15 DIAGNOSIS — Z79899 Other long term (current) drug therapy: Secondary | ICD-10-CM | POA: Diagnosis not present

## 2014-02-15 DIAGNOSIS — N200 Calculus of kidney: Secondary | ICD-10-CM | POA: Diagnosis not present

## 2014-02-15 DIAGNOSIS — Z7982 Long term (current) use of aspirin: Secondary | ICD-10-CM | POA: Diagnosis not present

## 2014-02-17 DIAGNOSIS — N200 Calculus of kidney: Secondary | ICD-10-CM | POA: Diagnosis not present

## 2014-02-23 DIAGNOSIS — Z8673 Personal history of transient ischemic attack (TIA), and cerebral infarction without residual deficits: Secondary | ICD-10-CM | POA: Diagnosis not present

## 2014-02-23 DIAGNOSIS — Z79899 Other long term (current) drug therapy: Secondary | ICD-10-CM | POA: Diagnosis not present

## 2014-02-23 DIAGNOSIS — I10 Essential (primary) hypertension: Secondary | ICD-10-CM | POA: Diagnosis not present

## 2014-02-23 DIAGNOSIS — N200 Calculus of kidney: Secondary | ICD-10-CM | POA: Diagnosis not present

## 2014-03-05 ENCOUNTER — Other Ambulatory Visit: Payer: Self-pay | Admitting: Physician Assistant

## 2014-03-05 NOTE — Telephone Encounter (Signed)
Rx request to pharmacy/[facility]  

## 2014-03-10 DIAGNOSIS — N200 Calculus of kidney: Secondary | ICD-10-CM | POA: Diagnosis not present

## 2014-03-25 DIAGNOSIS — N200 Calculus of kidney: Secondary | ICD-10-CM | POA: Diagnosis not present

## 2014-04-13 DIAGNOSIS — H524 Presbyopia: Secondary | ICD-10-CM | POA: Diagnosis not present

## 2014-04-13 DIAGNOSIS — H5200 Hypermetropia, unspecified eye: Secondary | ICD-10-CM | POA: Diagnosis not present

## 2014-04-13 DIAGNOSIS — H2510 Age-related nuclear cataract, unspecified eye: Secondary | ICD-10-CM | POA: Diagnosis not present

## 2014-04-23 ENCOUNTER — Ambulatory Visit: Payer: Medicare Other | Admitting: Family Medicine

## 2014-04-28 DIAGNOSIS — R3915 Urgency of urination: Secondary | ICD-10-CM | POA: Insufficient documentation

## 2014-04-28 DIAGNOSIS — N200 Calculus of kidney: Secondary | ICD-10-CM | POA: Diagnosis not present

## 2014-05-17 ENCOUNTER — Encounter: Payer: Self-pay | Admitting: Family Medicine

## 2014-05-17 ENCOUNTER — Ambulatory Visit (INDEPENDENT_AMBULATORY_CARE_PROVIDER_SITE_OTHER): Payer: Medicare Other | Admitting: Family Medicine

## 2014-05-17 VITALS — BP 124/80 | HR 61 | Temp 97.4°F | Ht 68.0 in | Wt 182.8 lb

## 2014-05-17 DIAGNOSIS — J45909 Unspecified asthma, uncomplicated: Secondary | ICD-10-CM | POA: Diagnosis not present

## 2014-05-17 DIAGNOSIS — L578 Other skin changes due to chronic exposure to nonionizing radiation: Secondary | ICD-10-CM

## 2014-05-17 DIAGNOSIS — I10 Essential (primary) hypertension: Secondary | ICD-10-CM | POA: Diagnosis not present

## 2014-05-17 DIAGNOSIS — H6500 Acute serous otitis media, unspecified ear: Secondary | ICD-10-CM

## 2014-05-17 DIAGNOSIS — Z23 Encounter for immunization: Secondary | ICD-10-CM

## 2014-05-17 DIAGNOSIS — H6501 Acute serous otitis media, right ear: Secondary | ICD-10-CM

## 2014-05-17 DIAGNOSIS — T7840XS Allergy, unspecified, sequela: Secondary | ICD-10-CM

## 2014-05-17 MED ORDER — MONTELUKAST SODIUM 10 MG PO TABS: 10.0000 mg | ORAL_TABLET | Freq: Every day | ORAL | Status: DC

## 2014-05-17 MED ORDER — ANTIPYRINE-BENZOCAINE 5.4-1.4 % OT SOLN: 3.0000 [drp] | OTIC | Status: DC | PRN

## 2014-05-17 MED ORDER — CEFDINIR 300 MG PO CAPS: 300.0000 mg | ORAL_CAPSULE | Freq: Two times a day (BID) | ORAL | Status: AC

## 2014-05-17 NOTE — Assessment & Plan Note (Signed)
Started on cefdinir and Auralgan prn. Report if no improvement

## 2014-05-17 NOTE — Assessment & Plan Note (Signed)
Noted a flare over the past few days, consider adding Singulair

## 2014-05-17 NOTE — Progress Notes (Signed)
Patient ID: Paul Drake, male   DOB: Dec 08, 1945, 68 y.o.   MRN: [id number] Paul Drake [id number] [birth year] 05/17/2014      Progress Note-Follow Up  Subjective  Chief Complaint  Chief Complaint  Patient presents with  . Otitis Media    X this morning- right ear  . Injections    flu    HPI  Patient is a 68 year old male in today for routine medical care. Patient in today with a sudden onset of right ear pain. He describes the pain as a 6-7/10 and constant this morning. Did not have any ear pain yesterday although he has been struggling with increasing allergic symptoms over the past several days. He notes an episode of wheezing and tightness in his chest yesterday but notes none today. He's had some trouble with conjunctivitis and irritation in his eyes as well as some sneezing and low-grade nasal congestion. Denies fevers and chills. Denies tinnitus or hearing changes. No malaise, myalgias. Denies CP/palp/SOB/HA/fevers/GI or GU c/o. Taking meds as prescribed  Past Medical History  Diagnosis Date  . Kidney stones   . CVA (cerebral infarction)   . Depression   . Hyperlipemia   . Hypertension   . Chicken pox   . Seasonal allergies     some asthma  . Measles as a child  . Mumps as a child  . Allergic state 12/29/2013  . Unspecified asthma(493.90) 12/29/2013  . GERD (gastroesophageal reflux disease)   . Arthritis     Past Surgical History  Procedure Laterality Date  . Lithotripsy    . Wisdom tooth extraction      Family History  Problem Relation Age of Onset  . Hypertension Mother   . Hyperlipidemia Mother   . Fibromyalgia Mother   . Arthritis Mother     rheumatoid  . Diabetes Sister     type 2  . Hyperlipidemia Brother   . Hypertension Brother   . Ulcers Father 36    Bleeding Ulcers  . Cancer Maternal Uncle     prostate  . Kidney Stones Daughter   . Asthma Daughter   . Healthy Son   . Cancer Maternal Grandmother 30    ?  Marland Kitchen Cancer Maternal Grandfather      skin ?    History   Social History  . Marital Status: Married    Spouse Name: N/A    Number of Children: N/A  . Years of Education: N/A   Occupational History  . Not on file.   Social History Main Topics  . Smoking status: Never Smoker   . Smokeless tobacco: Never Used  . Alcohol Use: Yes     Comment: 1-2 daily  . Drug Use: No  . 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 Not on file   Social History Narrative  . No narrative on file    Current Outpatient Prescriptions on File Prior to Visit  Medication Sig Dispense Refill  . albuterol (PROVENTIL HFA;VENTOLIN HFA) 108 (90 BASE) MCG/ACT inhaler Inhale 2 puffs into the lungs every 6 (six) hours as needed for wheezing or shortness of breath.  1 Inhaler  3  . aspirin 325 MG tablet Take 325 mg by mouth daily.      . Cetirizine HCl (ZYRTEC ALLERGY PO) Take 10 mg by mouth daily.       . Cholecalciferol (VITAMIN D) 2000 UNITS CAPS Take  2,000 Units by mouth daily.      . Citalopram Hydrobromide (CELEXA PO) Take 10 mg by mouth daily.       . fluticasone (FLONASE) 50 MCG/ACT nasal spray Place into both nostrils daily.      . fluticasone (FLONASE) 50 MCG/ACT nasal spray Place 2 sprays into both nostrils daily.  16 g  5  . hydrochlorothiazide (MICROZIDE) 12.5 MG capsule Take 1 capsule (12.5 mg total) by mouth daily.  30 capsule  2  . ibuprofen (ADVIL,MOTRIN) 400 MG tablet Take 1 tablet (400 mg total) by mouth every 6 (six) hours as needed.  30 tablet  0  . losartan (COZAAR) 100 MG tablet TAKE 1 TABLET BY MOUTH DAILY  90 tablet  0  . montelukast (SINGULAIR) 10 MG tablet Take 1 tablet (10 mg total) by mouth at bedtime.  30 tablet  5  . Multiple Vitamins-Minerals (MULTIVITAMINS THER. W/MINERALS) TABS tablet Take 1 tablet by mouth daily.      . Omega-3 Fatty Acids (OMEGA-3 FISH OIL) 1200 MG CAPS Take 1,200 mg by mouth daily.      Marland Kitchen OMEPRAZOLE PO Take 20 mg  by mouth daily.       Marland Kitchen OVER THE COUNTER MEDICATION Take 8 mg by mouth daily. Rapiflo      . Potassium Citrate 15 MEQ (1620 MG) TBCR Take 1 tablet by mouth 2 (two) times daily.      . simvastatin (ZOCOR) 20 MG tablet        No current facility-administered medications on file prior to visit.    No Known Allergies  Review of Systems  Review of Systems  Constitutional: Negative for fever and malaise/fatigue.  HENT: Positive for ear pain. Negative for congestion.   Eyes: Positive for discharge.  Respiratory: Positive for wheezing. Negative for sputum production and shortness of breath.   Cardiovascular: Negative for chest pain, palpitations and leg swelling.  Gastrointestinal: Negative for nausea, abdominal pain and diarrhea.  Genitourinary: Negative for dysuria.  Musculoskeletal: Negative for falls.  Skin: Negative for rash.  Neurological: Negative for loss of consciousness and headaches.  Endo/Heme/Allergies: Negative for polydipsia.  Psychiatric/Behavioral: Negative for depression and suicidal ideas. The patient is not nervous/anxious and does not have insomnia.     Objective  BP 124/80  Pulse 61  Temp(Src) 97.4 F (36.3 C) (Oral)  Ht 5\' 8"  (1.727 m)  Wt 182 lb 12.8 oz (82.918 kg)  BMI 27.80 kg/m2  SpO2 97%  Physical Exam  Physical Exam  Constitutional: He is oriented to person, place, and time and well-developed, well-nourished, and in no distress. No distress.  HENT:  Head: Normocephalic and atraumatic.  Right TM red and retracted>left  Eyes: Conjunctivae are normal.  Neck: Neck supple. No thyromegaly present.  Cardiovascular: Normal rate, regular rhythm and normal heart sounds.   No murmur heard. Pulmonary/Chest: Effort normal and breath sounds normal. No respiratory distress.  Abdominal: He exhibits no distension and no mass. There is no tenderness.  Musculoskeletal: He exhibits no edema.  Neurological: He is alert and oriented to person, place, and time.   Skin: Skin is warm.  Psychiatric: Memory, affect and judgment normal.    Lab Results  Component Value Date   TSH 3.087 12/29/2013   Lab Results  Component Value Date   WBC 5.7 12/29/2013   HGB 13.7 12/29/2013   HCT 38.8* 12/29/2013   MCV 91.3 12/29/2013   PLT 177 12/29/2013   Lab Results  Component Value Date   CREATININE  1.11 12/29/2013   BUN 16 12/29/2013   NA 138 12/29/2013   K 4.8 12/29/2013   CL 101 12/29/2013   CO2 28 12/29/2013   Lab Results  Component Value Date   ALT 15 12/29/2013   AST 22 12/29/2013   ALKPHOS 85 12/29/2013   BILITOT 0.8 12/29/2013   Lab Results  Component Value Date   CHOL 160 12/29/2013   Lab Results  Component Value Date   HDL 57 12/29/2013   Lab Results  Component Value Date   LDLCALC 78 12/29/2013   Lab Results  Component Value Date   TRIG 126 12/29/2013   Lab Results  Component Value Date   CHOLHDL 2.8 12/29/2013     Assessment & Plan  Essential hypertension, benign Well controlled, no changes to meds. Encouraged heart healthy diet such as the DASH diet and exercise as tolerated.   AOM (acute otitis media) Started on cefdinir and Auralgan prn. Report if no improvement  Allergic state Using Zyrtec and Flonase daily encouraged to increase Zyrtec to bid temporarily, add nasal saline and consider Singulair daily if symptoms persist  Unspecified asthma(493.90) Noted a flare over the past few days, consider adding Singulair

## 2014-05-17 NOTE — Patient Instructions (Signed)
Cetrizine 10 mg can increase to twice a day  Nasal saline twice daily  Otitis Media Otitis media is redness, soreness, and inflammation of the middle ear. Otitis media may be caused by allergies or, most commonly, by infection. Often it occurs as a complication of the common cold. SIGNS AND SYMPTOMS Symptoms of otitis media may include:  Earache.  Fever.  Ringing in your ear.  Headache.  Leakage of fluid from the ear. DIAGNOSIS To diagnose otitis media, your health care provider will examine your ear with an otoscope. This is an instrument that allows your health care provider to see into your ear in order to examine your eardrum. Your health care provider also will ask you questions about your symptoms. TREATMENT  Typically, otitis media resolves on its own within 3-5 days. Your health care provider may prescribe medicine to ease your symptoms of pain. If otitis media does not resolve within 5 days or is recurrent, your health care provider may prescribe antibiotic medicines if he or she suspects that a bacterial infection is the cause. HOME CARE INSTRUCTIONS   If you were prescribed an antibiotic medicine, finish it all even if you start to feel better.  Take medicines only as directed by your health care provider.  Keep all follow-up visits as directed by your health care provider. SEEK MEDICAL CARE IF:  You have otitis media only in one ear, or bleeding from your nose, or both.  You notice a lump on your neck.  You are not getting better in 3-5 days.  You feel worse instead of better. SEEK IMMEDIATE MEDICAL CARE IF:   You have pain that is not controlled with medicine.  You have swelling, redness, or pain around your ear or stiffness in your neck.  You notice that part of your face is paralyzed.  You notice that the bone behind your ear (mastoid) is tender when you touch it. MAKE SURE YOU:   Understand these instructions.  Will watch your condition.  Will get  help right away if you are not doing well or get worse. Document Released: 06/08/2004 Document Revised: 01/18/2014 Document Reviewed: 03/31/2013 [facility] Patient Information 2015 [place], [place]. This information is not intended to replace advice given to you by your health care provider. Make sure you discuss any questions you have with your health care provider.

## 2014-05-17 NOTE — Assessment & Plan Note (Signed)
Using Zyrtec and Flonase daily encouraged to increase Zyrtec to bid temporarily, add nasal saline and consider Singulair daily if symptoms persist

## 2014-05-17 NOTE — Assessment & Plan Note (Signed)
Well controlled, no changes to meds. Encouraged heart healthy diet such as the DASH diet and exercise as tolerated.  °

## 2014-05-17 NOTE — Progress Notes (Signed)
Pre visit review using our clinic review tool, if applicable. No additional management support is needed unless otherwise documented below in the visit note. 

## 2014-06-08 DIAGNOSIS — Z85828 Personal history of other malignant neoplasm of skin: Secondary | ICD-10-CM | POA: Diagnosis not present

## 2014-06-08 DIAGNOSIS — L578 Other skin changes due to chronic exposure to nonionizing radiation: Secondary | ICD-10-CM | POA: Diagnosis not present

## 2014-06-15 ENCOUNTER — Other Ambulatory Visit: Payer: Self-pay | Admitting: Family Medicine

## 2014-06-28 DIAGNOSIS — N200 Calculus of kidney: Secondary | ICD-10-CM | POA: Diagnosis not present

## 2014-06-28 DIAGNOSIS — N138 Other obstructive and reflux uropathy: Secondary | ICD-10-CM | POA: Insufficient documentation

## 2014-06-28 DIAGNOSIS — N401 Enlarged prostate with lower urinary tract symptoms: Secondary | ICD-10-CM | POA: Diagnosis not present

## 2014-07-06 ENCOUNTER — Encounter: Payer: Self-pay | Admitting: Medical

## 2014-07-06 ENCOUNTER — Ambulatory Visit (HOSPITAL_BASED_OUTPATIENT_CLINIC_OR_DEPARTMENT_OTHER)
Admission: RE | Admit: 2014-07-06 | Discharge: 2014-07-06 | Disposition: A | Discharge status: Home / Self Care | Payer: Medicare Other | Source: Ambulatory Visit | Attending: Medical | Admitting: Medical

## 2014-07-06 ENCOUNTER — Ambulatory Visit (INDEPENDENT_AMBULATORY_CARE_PROVIDER_SITE_OTHER): Payer: Medicare Other | Admitting: Medical

## 2014-07-06 VITALS — BP 128/79 | HR 82 | Temp 98.5°F | Ht 68.0 in | Wt 183.0 lb

## 2014-07-06 DIAGNOSIS — R1084 Generalized abdominal pain: Secondary | ICD-10-CM

## 2014-07-06 DIAGNOSIS — N289 Disorder of kidney and ureter, unspecified: Secondary | ICD-10-CM | POA: Diagnosis not present

## 2014-07-06 DIAGNOSIS — R109 Unspecified abdominal pain: Secondary | ICD-10-CM | POA: Diagnosis not present

## 2014-07-06 LAB — CBC WITH DIFFERENTIAL/PLATELET
Basophils Absolute: 0.1 10*3/uL (ref 0.0–0.1)
Basophils Relative: 1.1 % (ref 0.0–3.0)
EOS ABS: 0.2 10*3/uL (ref 0.0–0.7)
EOS PCT: 4.5 % (ref 0.0–5.0)
HEMATOCRIT: 41 % (ref 39.0–52.0)
Hemoglobin: 13.3 g/dL (ref 13.0–17.0)
LYMPHS ABS: 1.2 10*3/uL (ref 0.7–4.0)
Lymphocytes Relative: 22 % (ref 12.0–46.0)
MCHC: 32.4 g/dL (ref 30.0–36.0)
MCV: 98.3 fl (ref 78.0–100.0)
MONO ABS: 0.5 10*3/uL (ref 0.1–1.0)
Monocytes Relative: 10 % (ref 3.0–12.0)
NEUTROS PCT: 62.4 % (ref 43.0–77.0)
Neutro Abs: 3.4 10*3/uL (ref 1.4–7.7)
Platelets: 160 10*3/uL (ref 150.0–400.0)
RBC: 4.17 Mil/uL — ABNORMAL LOW (ref 4.22–5.81)
RDW: 12.6 % (ref 11.5–15.5)
WBC: 5.4 10*3/uL (ref 4.0–10.5)

## 2014-07-06 LAB — POCT URINALYSIS DIPSTICK
BILIRUBIN UA: NEGATIVE
Blood, UA: NEGATIVE
GLUCOSE UA: NEGATIVE
Ketones, UA: NEGATIVE
NITRITE UA: NEGATIVE
Spec Grav, UA: 1.01
UROBILINOGEN UA: 0.2
pH, UA: 7.5

## 2014-07-06 LAB — COMPREHENSIVE METABOLIC PANEL
ALBUMIN: 3.4 g/dL — AB (ref 3.5–5.2)
ALK PHOS: 64 U/L (ref 39–117)
ALT: 16 U/L (ref 0–53)
AST: 26 U/L (ref 0–37)
BILIRUBIN TOTAL: 0.7 mg/dL (ref 0.2–1.2)
BUN: 13 mg/dL (ref 6–23)
CO2: 28 mEq/L (ref 19–32)
Calcium: 9.1 mg/dL (ref 8.4–10.5)
Chloride: 105 mEq/L (ref 96–112)
Creatinine, Ser: 1.3 mg/dL (ref 0.4–1.5)
GFR: 59.04 mL/min — ABNORMAL LOW (ref >60.00)
Glucose, Bld: 79 mg/dL (ref 70–99)
POTASSIUM: 4.4 meq/L (ref 3.5–5.1)
SODIUM: 142 meq/L (ref 135–145)
Total Protein: 6.9 g/dL (ref 6.0–8.3)

## 2014-07-06 LAB — LIPASE: Lipase: 26 U/L (ref 11.0–59.0)

## 2014-07-06 LAB — H. PYLORI ANTIBODY, IGG: H Pylori IgG: NEGATIVE

## 2014-07-06 MED ORDER — METRONIDAZOLE 500 MG PO TABS: 500.0000 mg | ORAL_TABLET | Freq: Three times a day (TID) | ORAL | Status: DC

## 2014-07-06 MED ORDER — CIPROFLOXACIN HCL 500 MG PO TABS: 500.0000 mg | ORAL_TABLET | Freq: Two times a day (BID) | ORAL | Status: DC

## 2014-07-06 NOTE — Assessment & Plan Note (Signed)
Pt presents with pain that varies in different areas. Recent constipation but hx of ibs. Some gerd hx as well. Since 1 month of pain and at times severe initiated work up with abdomen xray, cbc, cmp, lipase and pylori test. Advised miralax otc, hydrate well, increase prilosec to 2 tabs po q day and rx(flagyl and cipro). Pending lab results and studies. If symptoms persist or worsen by Friday would get ct abd/pelvis. If symptoms worsen and after hours then ED evaluation.

## 2014-07-06 NOTE — Progress Notes (Signed)
Subjective:    Patient ID: Paul Drake, male    DOB: June 24, 1946, 68 y.o.   MRN: [id number]  HPI  Pt in with some abdominal pain recently. Pt states history of kidney stones since 68 yr old.  But he also mentions history of ibs. But last month has constant belly ache. The abdomen pains varies in location. Sometimes lower suprapubic pain. Sometimes epigastric pain. This past month couple of loose stools but no diarrhea on daily basis. Pt denies any history of diverticulitis. Pt had colonoscopy January 2015. Study of clear.  Pt appetite is not changed. Sometimes eating helps with pain. Very rare occasional nausea. No vomiting. Presently pain level 3-4/10.   Some episodes of constipation on and off. Recent past 2 days no bm.  Past Medical History  Diagnosis Date  . Kidney stones   . CVA (cerebral infarction)   . Depression   . Hyperlipemia   . Hypertension   . Chicken pox   . Seasonal allergies     some asthma  . Measles as a child  . Mumps as a child  . Allergic state 12/29/2013  . Unspecified asthma(493.90) 12/29/2013  . GERD (gastroesophageal reflux disease)   . Arthritis     History   Social History  . Marital Status: Married    Spouse Name: N/A    Number of Children: N/A  . Years of Education: N/A   Occupational History  . Not on file.   Social History Main Topics  . Smoking status: Never Smoker   . Smokeless tobacco: Never Used  . Alcohol Use: Yes     Comment: 1-2 daily  . Drug Use: No  . 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 Not on file   Social History Narrative  . No narrative on file    Past Surgical History  Procedure Laterality Date  . Lithotripsy    . Wisdom tooth extraction      Family History  Problem Relation Age of Onset  . Hypertension Mother   . Hyperlipidemia Mother   . Fibromyalgia Mother   . Arthritis Mother     rheumatoid  . Diabetes Sister      type 2  . Hyperlipidemia Brother   . Hypertension Brother   . Ulcers Father 36    Bleeding Ulcers  . Cancer Maternal Uncle     prostate  . Kidney Stones Daughter   . Asthma Daughter   . Healthy Son   . Cancer Maternal Grandmother 30    ?  Marland Kitchen Cancer Maternal Grandfather     skin ?    No Known Allergies  Current Outpatient Prescriptions on File Prior to Visit  Medication Sig Dispense Refill  . albuterol (PROVENTIL HFA;VENTOLIN HFA) 108 (90 BASE) MCG/ACT inhaler Inhale 2 puffs into the lungs every 6 (six) hours as needed for wheezing or shortness of breath.  1 Inhaler  3  . antipyrine-benzocaine (AURALGAN) otic solution Place 3-4 drops into both ears every 2 (two) hours as needed for ear pain.  10 mL  0  . aspirin 325 MG tablet Take 325 mg by mouth daily.      . Cetirizine HCl (ZYRTEC ALLERGY PO) Take 10 mg by mouth daily.       . Cholecalciferol (VITAMIN D) 2000 UNITS CAPS Take 2,000 Units by mouth daily.      . Citalopram Hydrobromide (CELEXA PO)  Take 10 mg by mouth daily.       . fluticasone (FLONASE) 50 MCG/ACT nasal spray Place into both nostrils daily.      . fluticasone (FLONASE) 50 MCG/ACT nasal spray Place 2 sprays into both nostrils daily.  16 g  5  . hydrochlorothiazide (MICROZIDE) 12.5 MG capsule Take 1 capsule (12.5 mg total) by mouth daily.  30 capsule  2  . ibuprofen (ADVIL,MOTRIN) 400 MG tablet Take 1 tablet (400 mg total) by mouth every 6 (six) hours as needed.  30 tablet  0  . losartan (COZAAR) 100 MG tablet TAKE 1 TABLET BY MOUTH EVERY DAY  90 tablet  0  . montelukast (SINGULAIR) 10 MG tablet Take 1 tablet (10 mg total) by mouth at bedtime.  30 tablet  5  . montelukast (SINGULAIR) 10 MG tablet Take 1 tablet (10 mg total) by mouth at bedtime.  30 tablet  3  . Multiple Vitamins-Minerals (MULTIVITAMINS THER. W/MINERALS) TABS tablet Take 1 tablet by mouth daily.      . Omega-3 Fatty Acids (OMEGA-3 FISH OIL) 1200 MG CAPS Take 1,200 mg by mouth daily.      Marland Kitchen OMEPRAZOLE  PO Take 20 mg by mouth daily.       Marland Kitchen OVER THE COUNTER MEDICATION Take 8 mg by mouth daily. Rapiflo      . Potassium Citrate 15 MEQ (1620 MG) TBCR Take 1 tablet by mouth 2 (two) times daily.      . simvastatin (ZOCOR) 20 MG tablet        No current facility-administered medications on file prior to visit.    BP 128/79  Pulse 82  Temp(Src) 98.5 F (36.9 C) (Oral)  Ht 5\' 8"  (1.727 m)  Wt 183 lb (83.008 kg)  BMI 27.83 kg/m2  SpO2 96%      Review of Systems  Constitutional: Negative for fever, chills and fatigue.  HENT: Negative.   Respiratory: Negative for cough, choking, chest tightness, shortness of breath and wheezing.   Cardiovascular: Negative for chest pain, palpitations and leg swelling.  Gastrointestinal: Positive for nausea, abdominal pain, diarrhea and constipation. Negative for vomiting, blood in stool, abdominal distention and anal bleeding.       See hpi.  Endocrine: Negative for polydipsia, polyphagia and polyuria.  Genitourinary: Negative for dysuria, urgency, frequency, flank pain, decreased urine volume, discharge, scrotal swelling, penile pain and testicular pain.  Musculoskeletal: Negative for back pain.  Skin: Negative.   Neurological: Negative.   Hematological: Negative for adenopathy. Does not bruise/bleed easily.  Psychiatric/Behavioral: Negative.        Objective:   Physical Exam  Constitutional: He appears well-developed and well-nourished. No distress.  No acute distress and Paul pt.  HENT:  Head: Normocephalic and atraumatic.  Eyes: Conjunctivae and EOM are normal. Pupils are equal, round, and reactive to light. Right eye exhibits no discharge. Left eye exhibits no discharge. No scleral icterus.  Neck: Normal range of motion. Neck supple. No JVD present. No tracheal deviation present. No thyromegaly present.  Cardiovascular: Normal rate, regular rhythm and normal heart sounds.  Exam reveals no gallop and no friction rub.   No murmur  heard. Pulmonary/Chest: Effort normal and breath sounds normal. No stridor. No respiratory distress. He has no wheezes. He has no rales. He exhibits no tenderness.  Abdominal: Soft. Bowel sounds are normal. He exhibits no distension and no mass. There is tenderness. There is no rebound and no guarding.  Mild umbilical tenderness lower quadrant mild tender and  suprapubic. No heal jar pain rt lower quadrant.  Genitourinary: Rectum normal.  Mild enlarged prostate.(know hx of). Sphincter tone normal.Negative hemoccult card.  Lymphadenopathy:    He has no cervical adenopathy.  Neurological: He is alert. No cranial nerve deficit. Coordination normal.  Skin: He is not diaphoretic.  Psychiatric: He has a normal mood and affect. His behavior is normal. Judgment and thought content normal.  .        Assessment & Plan:

## 2014-07-06 NOTE — Progress Notes (Signed)
Pre visit review using our clinic review tool, if applicable. No additional management support is needed unless otherwise documented below in the visit note. 

## 2014-07-06 NOTE — Patient Instructions (Addendum)
For your abdominal pain, I want to get labs today and get abdominal xray. With your history of kidney stones, ibs, and recent intermittent constipation it is hard to give exact diagnosis at this time. Your stool test was negative for blood in our office.  I want you to take miralax otc today to see if you will have bowel movement. Take 2 tab of your omeprazole  every day. Also will prescribe 5 day course of cipro and flagyl pending lab results.   In the event of worsening or changing signs and symptoms(severe then ED evaluation).  If not improved significantly by Thursday afternoon please notify me and I would like to get ct of abdomen/pelvis before the weekend.  Follow up Friday or as needed.

## 2014-07-08 ENCOUNTER — Ambulatory Visit (INDEPENDENT_AMBULATORY_CARE_PROVIDER_SITE_OTHER): Payer: Medicare Other | Admitting: Medical

## 2014-07-08 ENCOUNTER — Encounter (HOSPITAL_BASED_OUTPATIENT_CLINIC_OR_DEPARTMENT_OTHER): Payer: Self-pay

## 2014-07-08 ENCOUNTER — Ambulatory Visit (HOSPITAL_BASED_OUTPATIENT_CLINIC_OR_DEPARTMENT_OTHER)
Admission: RE | Admit: 2014-07-08 | Discharge: 2014-07-08 | Disposition: A | Discharge status: Home / Self Care | Payer: Medicare Other | Source: Ambulatory Visit | Attending: Medical | Admitting: Medical

## 2014-07-08 ENCOUNTER — Telehealth: Payer: Self-pay | Admitting: Family Medicine

## 2014-07-08 VITALS — BP 143/83 | HR 73 | Temp 98.5°F | Ht 68.0 in | Wt 180.8 lb

## 2014-07-08 DIAGNOSIS — R1033 Periumbilical pain: Secondary | ICD-10-CM | POA: Diagnosis not present

## 2014-07-08 DIAGNOSIS — R1084 Generalized abdominal pain: Secondary | ICD-10-CM | POA: Diagnosis not present

## 2014-07-08 DIAGNOSIS — N200 Calculus of kidney: Secondary | ICD-10-CM | POA: Diagnosis not present

## 2014-07-08 MED ORDER — IOHEXOL 300 MG/ML  SOLN
100.0000 mL | Freq: Once | INTRAMUSCULAR | Status: AC | PRN
  Administered 2014-07-08: 100 mL via INTRAVENOUS

## 2014-07-08 NOTE — Assessment & Plan Note (Signed)
I did get a CT of the abdomen/pelvis this afternoon. There is also called to me and it did not show any acute process other than he has a moderate amount of stool present but nothing that was described as significant. I talk with the patient and notified him of the CT results. He will continue the plan which advise last time. He is going to stop drinking his 2 mixed drinks each night. I will go ahead and refer him to gastroenterologist for possible EGD. During the interim if he has any severe abdominal pain he understands he should go to the emergency department. I explained to him this is the case even in light of the negative CT scan. Patient agreed to express understanding.

## 2014-07-08 NOTE — Telephone Encounter (Signed)
Caller name: [name]  Call back number: [phone]   Reason for call: Pt states was in on Tuesday 10.20.15 and is giving a follow up on his acute visit. Pt states is still having stomach pain but was not as severe as before. Pt state is having a sharp pain when stomach is empty during the morning when waking up and also in the afternoon between hours of dinner. Pt states that [name] mention if not improved to have MRI done. Pt wants to know if MRI needed. Please advise.

## 2014-07-08 NOTE — Progress Notes (Signed)
Subjective:    Patient ID: Paul Drake, male    DOB: 02/07/1946, 68 y.o.   MRN: [id number]  HPI  Pt presented on last visit with pain that varies in different areas. Recent constipation but hx of ibs. Some gerd hx as well. Since 1 month of pain and at times severe pain so I initiated work up with abdomen xray, cbc, cmp, lipase and pylori test. Advised miralax otc, hydrate well, increase prilosec to 2 tabs po q day and rx(flagyl and cipro). Pending lab results and studies. If symptoms persist or worsen by Friday would get ct abd/pelvis. If symptoms worsen and after hours then ED evaluation.  Above summary last visit. He states yesterday he felt maybe little better but this morning and then this afternoon pain seemed to worsen. Labs were negative. Pt xrays were negative. Pt states sometimes pain lessened with food. No history of ulcers.   Pt did do above measures that I advised. He did have bm but still symptoms.  Past Medical History  Diagnosis Date  . Kidney stones   . CVA (cerebral infarction)   . Depression   . Hyperlipemia   . Hypertension   . Chicken pox   . Seasonal allergies     some asthma  . Measles as a child  . Mumps as a child  . Allergic state 12/29/2013  . Unspecified asthma(493.90) 12/29/2013  . GERD (gastroesophageal reflux disease)   . Arthritis     History   Social History  . Marital Status: Married    Spouse Name: N/A    Number of Children: N/A  . Years of Education: N/A   Occupational History  . Not on file.   Social History Main Topics  . Smoking status: Never Smoker   . Smokeless tobacco: Never Used  . Alcohol Use: Yes     Comment: 1-2 daily  . Drug Use: No  . 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 Not on file   Social History Narrative  . No narrative on file    Past Surgical History  Procedure Laterality Date  . Lithotripsy    . Wisdom tooth  extraction      Family History  Problem Relation Age of Onset  . Hypertension Mother   . Hyperlipidemia Mother   . Fibromyalgia Mother   . Arthritis Mother     rheumatoid  . Diabetes Sister     type 2  . Hyperlipidemia Brother   . Hypertension Brother   . Ulcers Father 36    Bleeding Ulcers  . Cancer Maternal Uncle     prostate  . Kidney Stones Daughter   . Asthma Daughter   . Healthy Son   . Cancer Maternal Grandmother 30    ?  Marland Kitchen Cancer Maternal Grandfather     skin ?    No Known Allergies  Current Outpatient Prescriptions on File Prior to Visit  Medication Sig Dispense Refill  . albuterol (PROVENTIL HFA;VENTOLIN HFA) 108 (90 BASE) MCG/ACT inhaler Inhale 2 puffs into the lungs every 6 (six) hours as needed for wheezing or shortness of breath.  1 Inhaler  3  . aspirin 325 MG tablet Take 325 mg by mouth daily.      . Cetirizine HCl (ZYRTEC ALLERGY PO) Take 10 mg by mouth daily.       . Cholecalciferol (VITAMIN D) 2000 UNITS CAPS Take 2,000  Units by mouth daily.      . ciprofloxacin (CIPRO) 500 MG tablet Take 1 tablet (500 mg total) by mouth 2 (two) times daily.  10 tablet  0  . Citalopram Hydrobromide (CELEXA PO) Take 10 mg by mouth daily.       . fluticasone (FLONASE) 50 MCG/ACT nasal spray Place into both nostrils daily.      . hydrochlorothiazide (MICROZIDE) 12.5 MG capsule Take 1 capsule (12.5 mg total) by mouth daily.  30 capsule  2  . ibuprofen (ADVIL,MOTRIN) 400 MG tablet Take 1 tablet (400 mg total) by mouth every 6 (six) hours as needed.  30 tablet  0  . losartan (COZAAR) 100 MG tablet TAKE 1 TABLET BY MOUTH EVERY DAY  90 tablet  0  . metroNIDAZOLE (FLAGYL) 500 MG tablet Take 1 tablet (500 mg total) by mouth 3 (three) times daily.  15 tablet  0  . montelukast (SINGULAIR) 10 MG tablet Take 1 tablet (10 mg total) by mouth at bedtime.  30 tablet  5  . Multiple Vitamins-Minerals (MULTIVITAMINS THER. W/MINERALS) TABS tablet Take 1 tablet by mouth daily.      . Omega-3  Fatty Acids (OMEGA-3 FISH OIL) 1200 MG CAPS Take 1,200 mg by mouth daily.      Marland Kitchen OMEPRAZOLE PO Take 20 mg by mouth daily.       Marland Kitchen OVER THE COUNTER MEDICATION Take 8 mg by mouth daily. Rapiflo      . Potassium Citrate 15 MEQ (1620 MG) TBCR Take 1 tablet by mouth 2 (two) times daily.      . simvastatin (ZOCOR) 20 MG tablet       . antipyrine-benzocaine (AURALGAN) otic solution Place 3-4 drops into both ears every 2 (two) hours as needed for ear pain.  10 mL  0   No current facility-administered medications on file prior to visit.    BP 143/83  Pulse 73  Temp(Src) 98.5 F (36.9 C) (Oral)  Ht 5\' 8"  (1.727 m)  Wt 180 lb 12.8 oz (82.01 kg)  BMI 27.50 kg/m2  SpO2 97%       Review of Systems  Constitutional: Negative for fever, chills and fatigue.  HENT: Negative.   Respiratory: Negative for cough, chest tightness, shortness of breath and wheezing.   Cardiovascular: Negative for chest pain and palpitations.  Gastrointestinal: Positive for abdominal pain. Negative for nausea, vomiting, diarrhea, constipation, blood in stool, abdominal distention, anal bleeding and rectal pain.  Genitourinary: Negative.   Musculoskeletal: Negative.   Neurological: Negative.   Hematological: Negative for adenopathy. Does not bruise/bleed easily.  Psychiatric/Behavioral: Negative.        Objective:   Physical Exam  Constitutional: He appears well-developed and well-nourished. No distress.  No acute distress and pleasant pt.  HENT:  Head: Normocephalic and atraumatic.  Eyes: Conjunctivae and EOM are normal. Pupils are equal, round, and reactive to light. Right eye exhibits no discharge. Left eye exhibits no discharge. No scleral icterus.  Neck: Normal range of motion. Neck supple. No JVD present. No tracheal deviation present. No thyromegaly present.  Cardiovascular: Normal rate, regular rhythm and normal heart sounds.  Exam reveals no gallop and no friction rub.   No murmur heard. Pulmonary/Chest:  Effort normal and breath sounds normal. No stridor. No respiratory distress. He has no wheezes. He has no rales. He exhibits no tenderness.  Abdominal: Soft. Bowel sounds are normal. He exhibits no distension and no mass. There is tenderness. There is no rebound and no guarding.  Mild umbilical area pain today. No pain in the lower quadrants today.  Genitourinary: Rectum normal.  Mild enlarged prostate.(know hx of). Sphincter tone normal.Negative hemoccult card.  Lymphadenopathy:    He has no cervical adenopathy.  Neurological: He is alert. No cranial nerve deficit. Coordination normal.  Skin: He is not diaphoretic.  Psychiatric: He has a normal mood and affect. His behavior is normal. Judgment and thought content normal.          Assessment & Plan:

## 2014-07-08 NOTE — Patient Instructions (Signed)
I will schedule you for stat ct of abdomen and pelvis this afternoon. I want you to stay down in imaging until I am called on stat results.   Continue current med plan discussed yesteday.  If test negative will refer to Gi for work up possible EGD. If ct negative but abd pain worsens then still advise ED eval.  Follow up in 7 days or as needed.

## 2014-07-08 NOTE — Progress Notes (Signed)
Pre visit review using our clinic review tool, if applicable. No additional management support is needed unless otherwise documented below in the visit note. 

## 2014-07-09 ENCOUNTER — Encounter: Payer: Self-pay | Admitting: Physician Assistant

## 2014-07-09 ENCOUNTER — Encounter: Payer: Self-pay | Admitting: *Deleted

## 2014-07-14 ENCOUNTER — Encounter: Payer: Self-pay | Admitting: Physician Assistant

## 2014-07-14 ENCOUNTER — Other Ambulatory Visit (INDEPENDENT_AMBULATORY_CARE_PROVIDER_SITE_OTHER): Payer: Medicare Other

## 2014-07-14 ENCOUNTER — Ambulatory Visit (INDEPENDENT_AMBULATORY_CARE_PROVIDER_SITE_OTHER): Payer: Medicare Other | Admitting: Physician Assistant

## 2014-07-14 VITALS — BP 104/68 | HR 72 | Ht 68.0 in | Wt 183.8 lb

## 2014-07-14 DIAGNOSIS — R1013 Epigastric pain: Secondary | ICD-10-CM

## 2014-07-14 DIAGNOSIS — K219 Gastro-esophageal reflux disease without esophagitis: Secondary | ICD-10-CM

## 2014-07-14 DIAGNOSIS — K5900 Constipation, unspecified: Secondary | ICD-10-CM

## 2014-07-14 DIAGNOSIS — K589 Irritable bowel syndrome without diarrhea: Secondary | ICD-10-CM

## 2014-07-14 LAB — IGA: IgA: 236 mg/dL (ref 68–378)

## 2014-07-14 LAB — TSH: TSH: 2.45 u[IU]/mL (ref 0.35–4.50)

## 2014-07-14 MED ORDER — METRONIDAZOLE 250 MG PO TABS: 250.0000 mg | ORAL_TABLET | Freq: Three times a day (TID) | ORAL | Status: DC

## 2014-07-14 MED ORDER — OMEPRAZOLE 40 MG PO CPDR: 40.0000 mg | DELAYED_RELEASE_CAPSULE | Freq: Every day | ORAL | Status: DC

## 2014-07-14 NOTE — Patient Instructions (Addendum)
Your physician has requested that you go to the basement for the following lab work before leaving today: TTG, IGA, TSH  We have sent the following medications to your pharmacy for you to pick up at your convenience: Omeprazole Flagyl  You have been scheduled for an endoscopy. Please follow written instructions given to you at your visit today. If you use inhalers (even only as needed), please bring them with you on the day of your procedure. Your physician has requested that you go to www.startemmi.com and enter the access code given to you at your visit today. This web site gives a general overview about your procedure. However, you should still follow specific instructions given to you by our office regarding your preparation for the procedure.  Cecille Rubin PA-C recommends that you complete a bowel purge (to clean out your bowels). Please do the following: Purchase a bottle of Miralax over the counter as well as a box of 5 mg dulcolax tablets. Take 4 dulcolax tablets. Wait 1 hour. You will then drink 6-8 capfuls of Miralax mixed in an adequate amount of water/juice/gatorade (you may choose which of these liquids to drink) over the next 2-3 hours. You should expect results within 1 to 6 hours after completing the bowel purge.  Then take Miralax 1-2 caps full daily and titrate as needed.  You have been schedule for a follow-up appointment with Lori-PA-C on 08/16/14 at 8:15 am.  Gastroesophageal Reflux Disease, Adult Gastroesophageal reflux disease (GERD) happens when acid from your stomach flows up into the esophagus. When acid comes in contact with the esophagus, the acid causes soreness (inflammation) in the esophagus. Over time, GERD may create small holes (ulcers) in the lining of the esophagus. CAUSES   Increased body weight. This puts pressure on the stomach, making acid rise from the stomach into the esophagus.  Smoking. This increases acid production in the stomach.  Drinking alcohol.  This causes decreased pressure in the lower esophageal sphincter (valve or ring of muscle between the esophagus and stomach), allowing acid from the stomach into the esophagus.  Late evening meals and a full stomach. This increases pressure and acid production in the stomach.  A malformed lower esophageal sphincter. Sometimes, no cause is found. SYMPTOMS   Burning pain in the lower part of the mid-chest behind the breastbone and in the mid-stomach area. This may occur twice a week or more often.  Trouble swallowing.  Sore throat.  Dry cough.  Asthma-like symptoms including chest tightness, shortness of breath, or wheezing. DIAGNOSIS  Your caregiver may be able to diagnose GERD based on your symptoms. In some cases, X-rays and other tests may be done to check for complications or to check the condition of your stomach and esophagus. TREATMENT  Your caregiver may recommend over-the-counter or prescription medicines to help decrease acid production. Ask your caregiver before starting or adding any new medicines.  HOME CARE INSTRUCTIONS   Change the factors that you can control. Ask your caregiver for guidance concerning weight loss, quitting smoking, and alcohol consumption.  Avoid foods and drinks that make your symptoms worse, such as:  Caffeine or alcoholic drinks.  Chocolate.  Peppermint or mint flavorings.  Garlic and onions.  Spicy foods.  Citrus fruits, such as oranges, lemons, or limes.  Tomato-based foods such as sauce, chili, salsa, and pizza.  Fried and fatty foods.  Avoid lying down for the 3 hours prior to your bedtime or prior to taking a nap.  Eat small, frequent meals instead  of large meals.  Wear loose-fitting clothing. Do not wear anything tight around your waist that causes pressure on your stomach.  Raise the head of your bed 6 to 8 inches with wood blocks to help you sleep. Extra pillows will not help.  Only take over-the-counter or prescription  medicines for pain, discomfort, or fever as directed by your caregiver.  Do not take aspirin, ibuprofen, or other nonsteroidal anti-inflammatory drugs (NSAIDs). SEEK IMMEDIATE MEDICAL CARE IF:   You have pain in your arms, neck, jaw, teeth, or back.  Your pain increases or changes in intensity or duration.  You develop nausea, vomiting, or sweating (diaphoresis).  You develop shortness of breath, or you faint.  Your vomit is green, yellow, black, or looks like coffee grounds or blood.  Your stool is red, bloody, or black. These symptoms could be signs of other problems, such as heart disease, gastric bleeding, or esophageal bleeding. MAKE SURE YOU:   Understand these instructions.  Will watch your condition.  Will get help right away if you are not doing well or get worse. Document Released: 06/13/2005 Document Revised: 11/26/2011 Document Reviewed: 03/23/2011 [facility] Patient Information 2015 [place], [place]. This information is not intended to replace advice given to you by your health care provider. Make sure you discuss any questions you have with your health care provider.

## 2014-07-14 NOTE — Progress Notes (Signed)
Reviewed and agree with management plan.  Da Michelle T. Danton Palmateer, MD FACG 

## 2014-07-14 NOTE — Progress Notes (Signed)
Patient ID: Aziz Slape, male   DOB: [birth year], 68 y.o.   MRN: [id number]    HPI: Tully is a 68 year old male referred for evaluation by Mackie Pai, PA-C due to epigastric pain and erratic bowel movements. The patient recently relocated from [place]. He relates a long-standing history of multiple kidney stones since he was in his early 42s. He has had multiple lithotripsies and 2 surgical removals of stones. He also reports that he has a long-standing history of irritable bowel syndrome, anxiety, and depression. He reports that he was in the hospital in [facility] in [place] 1-2 years ago due to his kidney stones and during the hospitalization had a TIA. Since his TIA, he feels his depression and anxiety have become worse and thus "my IBS is worse".  Over the past 6 weeks he has been having epigastric pain that is worse on an empty stomach and alleviated for a small amount of time with ingestion of food. He is nauseous but has been unable to vomit. His appetite is good but he states he feels full after several bites and then feels as if the food doesn't move out of his stomach. He feels very bloated and gassy. He has been getting heartburn on a daily basis. He had been using omeprazole 20 mg daily and last week increased it to 40 mg daily which is providing more relief. He has tried to decrease his caffeine intake as well. He reports that he had an EGD many years ago in [facility] in [place]. He states he does not know the results. He says he was told he was also suspect to gluten intolerance". He says he had celiac testing many years ago that was negative. He has a daughter with celiac disease and he reports if he decreases gluten in his diet he feels much better.   He reports that his stools alternate between formed and diarrhea. He tends to be more on the constipated side. His last bowel movement was yesterday and he says over the past 2 weeks his stools have been heavy and  sink to the bottom. His stools have been dark but not black. He has never tried Amitiza or Linzess and has never tried Office manager. He was seen by his primary care physician due to abdominal pain last week, and was sent for a CT of the abdomen and pelvis which showed moderate volume stool throughout the entire colon indicative of constipation. He had been given a trial of Cipro for 5 days. He states he felt less gassy and bloated and have less abdominal distress while on the antibiotics but since he has completed the antibiotics he again feels very gassy and bloated. He also had bilateral nephrolithiasis without ureterolithiasis or obstructive uropathy. He states he had a colonoscopy about a year ago at [facility] he states he was told there were no polyps and no diverticular disease.   Past Medical History  Diagnosis Date  . Kidney stones   . CVA (cerebral infarction)   . Depression   . Hyperlipemia   . Hypertension   . Chicken pox   . Seasonal allergies     some asthma  . Measles as a child  . Mumps as a child  . Allergic state 12/29/2013  . Unspecified asthma(493.90) 12/29/2013  . GERD (gastroesophageal reflux disease)   . Arthritis   . IBS (irritable bowel syndrome)     Past Surgical History  Procedure Laterality Date  . Lithotripsy    .  Wisdom tooth extraction    . Colonoscopy     Family History  Problem Relation Age of Onset  . Hypertension Mother   . Hyperlipidemia Mother   . Fibromyalgia Mother   . Arthritis Mother     rheumatoid  . Diabetes Sister     type 2  . Hyperlipidemia Brother   . Hypertension Brother   . Ulcers Father 36    Bleeding Ulcers  . Cancer Maternal Uncle     prostate  . Kidney Stones Daughter   . Asthma Daughter   . Healthy Son   . Cancer Maternal Grandmother 30    ?  Marland Kitchen Cancer Maternal Grandfather     skin ?   History  Substance Use Topics  . Smoking status: Never Smoker   . Smokeless tobacco: Never Used  . Alcohol Use: Yes      Comment: 1-2 daily   Current Outpatient Prescriptions  Medication Sig Dispense Refill  . albuterol (PROVENTIL HFA;VENTOLIN HFA) 108 (90 BASE) MCG/ACT inhaler Inhale 2 puffs into the lungs every 6 (six) hours as needed for wheezing or shortness of breath.  1 Inhaler  3  . aspirin 325 MG tablet Take 325 mg by mouth daily.      . Cetirizine HCl (ZYRTEC ALLERGY PO) Take 10 mg by mouth daily.       . Cholecalciferol (VITAMIN D) 2000 UNITS CAPS Take 2,000 Units by mouth daily.      . Citalopram Hydrobromide (CELEXA PO) Take 10 mg by mouth daily.       . fluticasone (FLONASE) 50 MCG/ACT nasal spray Place into both nostrils daily.      Marland Kitchen losartan (COZAAR) 100 MG tablet TAKE 1 TABLET BY MOUTH EVERY DAY  90 tablet  0  . montelukast (SINGULAIR) 10 MG tablet Take 1 tablet (10 mg total) by mouth at bedtime.  30 tablet  5  . Multiple Vitamins-Minerals (MULTIVITAMINS THER. W/MINERALS) TABS tablet Take 1 tablet by mouth daily.      . Omega-3 Fatty Acids (OMEGA-3 FISH OIL) 1200 MG CAPS Take 1,200 mg by mouth daily.      Marland Kitchen OMEPRAZOLE PO Take 20 mg by mouth daily.       Marland Kitchen OVER THE COUNTER MEDICATION Take 8 mg by mouth daily. Rapiflo      . Potassium Citrate 15 MEQ (1620 MG) TBCR Take 1 tablet by mouth 2 (two) times daily.      . simvastatin (ZOCOR) 20 MG tablet       . ciprofloxacin (CIPRO) 500 MG tablet Take 1 tablet (500 mg total) by mouth 2 (two) times daily.  10 tablet  0  . metroNIDAZOLE (FLAGYL) 250 MG tablet Take 1 tablet (250 mg total) by mouth 3 (three) times daily.  30 tablet  0  . omeprazole (PRILOSEC) 40 MG capsule Take 1 capsule (40 mg total) by mouth daily.  90 capsule  3   No current facility-administered medications for this visit.   No Known Allergies   Review of Systems: Gen: Denies any fever, chills, sweats, anorexia, fatigue, weakness, malaise, weight loss, and sleep disorder CV: Denies chest pain, angina, palpitations, syncope, orthopnea, PND, peripheral edema, and  claudication. Resp: Denies dyspnea at rest, dyspnea with exercise, cough, sputum, wheezing, coughing up blood, and pleurisy. GI: Denies vomiting blood, jaundice, and fecal incontinence.   Denies dysphagia or odynophagia. GU : Denies urinary burning, blood in urine, urinary frequency, urinary hesitancy, nocturnal urination, and urinary incontinence. MS: Denies joint pain, limitation  of movement, and swelling, stiffness, low back pain, extremity pain. Denies muscle weakness, cramps, atrophy.  Derm: Denies rash, itching, dry skin, hives, moles, warts, or unhealing ulcers.  Psych: Denies depression, anxiety, memory loss, suicidal ideation, hallucinations, paranoia, and confusion. Heme: Denies bruising, bleeding, and enlarged lymph nodes. Neuro:  Denies any headaches, dizziness, paresthesias. Endo:  Denies any problems with DM, thyroid, adrenal function  Studies: Ct Abdomen Pelvis W Contrast  07/08/2014   CLINICAL DATA:  Generalized abdominal pain for 1 month. Increasing in severity.  EXAM: CT ABDOMEN AND PELVIS WITH CONTRAST  TECHNIQUE: Multidetector CT imaging of the abdomen and pelvis was performed using the standard protocol following bolus administration of intravenous contrast.  CONTRAST:  147mL OMNIPAQUE IOHEXOL 300 MG/ML  SOLN  COMPARISON:  None.  FINDINGS: Lower chest:  Lung bases are clear.  Hepatobiliary: No focal hepatic lesion.  Gallbladder normal.  Pancreas: Pancreas is normal. No duct dilatation. No pancreatic inflammation.  Spleen: Normal spleen.  Adrenals/urinary tract: Adrenal glands are normal. The are coarse calcifications within the left kidney measuring 7 to 8 mm. No ureterolithiasis or hydronephrosis. 2 smaller calcifications within the right kidney. No right ureterolithiasis. No bladder calculi  Stomach/Bowel: The stomach, small bowel, and cecum are normal. The colon and rectosigmoid colon are normal. Moderate volume stool throughout the colon. Rectum appears normal.   Vascular/Lymphatic: Abdominal aorta is normal caliber. There is no retroperitoneal or periportal lymphadenopathy. No pelvic lymphadenopathy.  Reproductive: Prostate gland is normal.  No inguinal hernia.  Musculoskeletal: No aggressive osseous lesion.  Other: No abscess or inflammation.  IMPRESSION: 1. No acute findings in the abdomen pelvis. 2. Moderate volume stool throughout the colon could indicate constipation. 3. Bilateral nephrolithiasis without ureterolithiasis or obstructive uropathy.   Electronically Signed   By: Suzy Bouchard M.D.   On: 07/08/2014 18:31   Dg Abd 2 Views  07/06/2014   CLINICAL DATA:  Diffuse abdominal pain for 1 month, history of kidney stones and prior lithotripsy  EXAM: ABDOMEN - 2 VIEW  COMPARISON:  None.  FINDINGS: Calcifications overlie both kidneys most consistent with bilateral renal calculi. The largest calcification overlies the lower pole of the left kidney measuring 7 mm with smaller calcifications overlying the right kidney of no more than 3-4 mm in diameter. No definite calcifications are seen along the expected courses of the ureters. Multiple calcifications in the bony pelvis are most typical of phleboliths. The bowel gas pattern is unremarkable.  IMPRESSION: Calcifications overlie both kidneys consistent with renal calculi as noted above. The bowel gas pattern is unremarkable.   Electronically Signed   By: Ivar Drape M.D.   On: 07/06/2014 11:07    LAB RESULTS: Blood work on 07/06/2014 revealed an alkaline phosphatase of 64, lipase 26, AST 26, ALT 16, total bili 0.7. CBC had a white blood cell, 5.4, hemoglobin 13.3, hematocrit 41, platelet count 160,000.  Physical Exam: BP 104/68  Pulse 72  Ht 5\' 8"  (1.727 m)  Wt 183 lb 12.8 oz (83.371 kg)  BMI 27.95 kg/m2 Constitutional: Pleasant,well-developed,pleasant male in no acute distress. HEENT: Normocephalic and atraumatic. Conjunctivae are normal. No scleral icterus. Neck supple, no  thyromegaly Cardiovascular: Normal rate, regular rhythm.  Pulmonary/chest: Effort normal and breath sounds normal. No wheezing, rales or rhonchi. Abdominal: Soft, nondistended, mild diffuse tenderness Bowel sounds active throughout. There are no masses palpable. No hepatomegaly. Extremities: no edema Lymphadenopathy: No cervical adenopathy noted. Neurological: Alert and oriented to person place and time. Skin: Skin is warm and dry. No rashes  noted. Psychiatric: Normal mood and affect. Behavior is normal.  ASSESSMENT AND PLAN: #1. Epigastric pain and dyspepsia. The symptoms may be due to some poorly controlled reflux. An antireflux regimen has been reviewed at length and he will use omeprazole 40 mg half hour prior to breakfast each morning. He will be scheduled for an EGD to assess for esophagitis, gastritis, or ulcer.The risks, benefits, and alternatives to endoscopy with possible biopsy and possible dilation were discussed with the patient and they consent to proceed. If he is noted to have retained food in the stomach at the time of endoscopy he may be considered for prokinetic therapy, however with his anxiety metoclopramide may be problematic for him.  #2. IBS. He has a long-standing history of erratic bowel movements which tend to be more on the constipated side. He will be given a mere a lax purge, and has then been instructed to start Mira lax 1-2 capfuls daily. He has been instructed to titrate his near a laxative to desired effect. He will be given a trial of Flagyl 250 mg 3 times daily for 10 days to see if this helps decrease the amount of gas he has. Celiac antibodies, TTG, IgA, and TSH will be obtained as well.  He will follow up in 4 weeks, sooner when necessary. The patient has been reviewed with Dr. Fuller Plan.    Krista Som, Vita Barley PA-C 07/14/2014, 10:39 AM

## 2014-07-16 LAB — TISSUE TRANSGLUTAMINASE, IGA: Tissue Transglutaminase Ab, IgA: 5.8 U/mL (ref <20)

## 2014-07-19 ENCOUNTER — Ambulatory Visit: Payer: Medicare Other | Admitting: Family Medicine

## 2014-07-23 ENCOUNTER — Ambulatory Visit (AMBULATORY_SURGERY_CENTER): Payer: Medicare Other | Admitting: Gastroenterology

## 2014-07-23 ENCOUNTER — Encounter: Payer: Self-pay | Admitting: Gastroenterology

## 2014-07-23 VITALS — BP 119/73 | HR 59 | Temp 96.6°F | Resp 17 | Ht 68.0 in | Wt 183.0 lb

## 2014-07-23 DIAGNOSIS — K219 Gastro-esophageal reflux disease without esophagitis: Secondary | ICD-10-CM

## 2014-07-23 DIAGNOSIS — R1013 Epigastric pain: Secondary | ICD-10-CM | POA: Diagnosis not present

## 2014-07-23 DIAGNOSIS — I10 Essential (primary) hypertension: Secondary | ICD-10-CM | POA: Diagnosis not present

## 2014-07-23 DIAGNOSIS — K2270 Barrett's esophagus without dysplasia: Secondary | ICD-10-CM | POA: Diagnosis not present

## 2014-07-23 DIAGNOSIS — E669 Obesity, unspecified: Secondary | ICD-10-CM | POA: Diagnosis not present

## 2014-07-23 DIAGNOSIS — K319 Disease of stomach and duodenum, unspecified: Secondary | ICD-10-CM | POA: Diagnosis not present

## 2014-07-23 DIAGNOSIS — K30 Functional dyspepsia: Secondary | ICD-10-CM | POA: Diagnosis not present

## 2014-07-23 MED ORDER — SODIUM CHLORIDE 0.9 % IV SOLN: 500.0000 mL | INTRAVENOUS | Status: DC

## 2014-07-23 NOTE — Progress Notes (Signed)
Called to room to assist during endoscopic procedure.  Patient ID and intended procedure confirmed with present staff. Received instructions for my participation in the procedure from the performing physician.  

## 2014-07-23 NOTE — Patient Instructions (Signed)
Impressions/recommendations:  z-line variable  Gastritis (handout given)  Continue PPI Call to schedule office visit in 1 month.  YOU HAD AN ENDOSCOPIC PROCEDURE TODAY AT [facility]: Refer to the procedure report that was given to you for any specific questions about what was found during the examination.  If the procedure report does not answer your questions, please call your gastroenterologist to clarify.  If you requested that your care partner not be given the details of your procedure findings, then the procedure report has been included in a sealed envelope for you to review at your convenience later.  YOU SHOULD EXPECT: Some feelings of bloating in the abdomen. Passage of more gas than usual.  Walking can help get rid of the air that was put into your GI tract during the procedure and reduce the bloating. If you had a lower endoscopy (such as a colonoscopy or flexible sigmoidoscopy) you may notice spotting of blood in your stool or on the toilet paper. If you underwent a bowel prep for your procedure, then you may not have a normal bowel movement for a few days.  DIET: Your first meal following the procedure should be a light meal and then it is ok to progress to your normal diet.  A half-sandwich or bowl of soup is an example of a good first meal.  Heavy or fried foods are harder to digest and may make you feel nauseous or bloated.  Likewise meals heavy in dairy and vegetables can cause extra gas to form and this can also increase the bloating.  Drink plenty of fluids but you should avoid alcoholic beverages for 24 hours.  ACTIVITY: Your care partner should take you home directly after the procedure.  You should plan to take it easy, moving slowly for the rest of the day.  You can resume normal activity the day after the procedure however you should NOT DRIVE or use heavy machinery for 24 hours (because of the sedation medicines used during the test).    SYMPTOMS TO REPORT  IMMEDIATELY: A gastroenterologist can be reached at any hour.  During normal business hours, 8:30 AM to 5:00 PM Monday through Friday, call [phone].  After hours and on weekends, please call the GI answering service at [phone] who will take a message and have the physician on call contact you.    Following upper endoscopy (EGD)  Vomiting of blood or coffee ground material  New chest pain or pain under the shoulder blades  Painful or persistently difficult swallowing  New shortness of breath  Fever of 100F or higher  Black, tarry-looking stools  FOLLOW UP: If any biopsies were taken you will be contacted by phone or by letter within the next 1-3 weeks.  Call your gastroenterologist if you have not heard about the biopsies in 3 weeks.  Our staff will call the home number listed on your records the next business day following your procedure to check on you and address any questions or concerns that you may have at that time regarding the information given to you following your procedure. This is a courtesy call and so if there is no answer at the home number and we have not heard from you through the emergency physician on call, we will assume that you have returned to your regular daily activities without incident.  SIGNATURES/CONFIDENTIALITY: You and/or your care partner have signed paperwork which will be entered into your electronic medical record.  These signatures attest to  the fact that that the information above on your After Visit Summary has been reviewed and is understood.  Full responsibility of the confidentiality of this discharge information lies with you and/or your care-partner.

## 2014-07-23 NOTE — Progress Notes (Signed)
Report to PACU, RN, vss, BBS= Clear.  

## 2014-07-23 NOTE — Op Note (Signed)
[place]  [address] [place], [address]   ENDOSCOPY PROCEDURE REPORT  PATIENT: Paul Drake, Paul Drake  MR#: [id number] BIRTHDATE: 12-12-1945 , 89  yrs. old GENDER: male ENDOSCOPIST: Ladene Artist, MD, [facility] PROCEDURE DATE:  07/23/2014 PROCEDURE:  EGD w/ biopsy ASA CLASS:     Class II INDICATIONS:  epigastric pain and history of esophageal reflux. MEDICATIONS: Monitored anesthesia care, Propofol 150 mg IV, and lidocaine 120 mg IV TOPICAL ANESTHETIC: none DESCRIPTION OF PROCEDURE: After the risks benefits and alternatives of the procedure were thoroughly explained, informed consent was obtained.  The LB [id number] endoscope was introduced through the mouth and advanced to the second portion of the duodenum , Without limitations.  The instrument was slowly withdrawn as the mucosa was fully examined.    ESOPHAGUS: The z-line was located 40cm from the incisors.  The z-line appeared irregular. The esophagus was otherwise normal. Multiple biopsies were performed at the irregular z-line. STOMACH: Moderate gastritis was found in the gastric body and fundus.  Multiple biopsies were performed.   The stomach otherwise appeared normal. DUODENUM: The duodenal mucosa showed no abnormalities in the bulb and 2nd part of the duodenum.  Retroflexed views revealed no abnormalities.   The scope was then withdrawn from the patient and the procedure completed.  COMPLICATIONS: There were no immediate complications.  ENDOSCOPIC IMPRESSION: 1.   The z-line was variable; multiple biopsies performed 2.   Gastritis in the gastric body and fundus; multiple biopsies performed 3.   The EGD otherwise appeared normal  RECOMMENDATIONS: 1.  Anti-reflux regimen 2.  Await pathology results 3.  Continue PPI daily 4.  Call to schedule a follow-up appointment with gi clinic 1 month   eSigned:  Ladene Artist, MD, [facility] 07/23/2014 2:49 PM

## 2014-07-26 ENCOUNTER — Telehealth: Payer: Self-pay | Admitting: *Deleted

## 2014-07-26 NOTE — Telephone Encounter (Signed)
  Follow up Call-  Call back number 07/23/2014  Post procedure Call Back phone  # [phone]  Permission to leave phone message Yes     Patient questions:  Do you have a fever, pain , or abdominal swelling? No. Pain Score  0 *  Have you tolerated food without any problems? Yes.    Have you been able to return to your normal activities? Yes.    Do you have any questions about your discharge instructions: Diet   No. Medications  No. Follow up visit  No.  Do you have questions or concerns about your Care? No.  Actions: * If pain score is 4 or above: No action needed, pain <4.

## 2014-07-28 ENCOUNTER — Encounter: Payer: Self-pay | Admitting: Gastroenterology

## 2014-08-02 ENCOUNTER — Ambulatory Visit (INDEPENDENT_AMBULATORY_CARE_PROVIDER_SITE_OTHER): Payer: Medicare Other | Admitting: Family Medicine

## 2014-08-02 ENCOUNTER — Encounter: Payer: Self-pay | Admitting: Family Medicine

## 2014-08-02 VITALS — BP 125/74 | HR 58 | Temp 98.3°F | Ht 68.0 in | Wt 182.6 lb

## 2014-08-02 DIAGNOSIS — K219 Gastro-esophageal reflux disease without esophagitis: Secondary | ICD-10-CM | POA: Diagnosis not present

## 2014-08-02 DIAGNOSIS — I10 Essential (primary) hypertension: Secondary | ICD-10-CM

## 2014-08-02 DIAGNOSIS — H6091 Unspecified otitis externa, right ear: Secondary | ICD-10-CM

## 2014-08-02 DIAGNOSIS — E785 Hyperlipidemia, unspecified: Secondary | ICD-10-CM | POA: Diagnosis not present

## 2014-08-02 DIAGNOSIS — N400 Enlarged prostate without lower urinary tract symptoms: Secondary | ICD-10-CM | POA: Diagnosis not present

## 2014-08-02 DIAGNOSIS — H60391 Other infective otitis externa, right ear: Secondary | ICD-10-CM | POA: Diagnosis not present

## 2014-08-02 LAB — CBC
HCT: 39.7 % (ref 39.0–52.0)
HEMOGLOBIN: 13.1 g/dL (ref 13.0–17.0)
MCHC: 32.9 g/dL (ref 30.0–36.0)
MCV: 96.7 fl (ref 78.0–100.0)
PLATELETS: 169 10*3/uL (ref 150.0–400.0)
RBC: 4.11 Mil/uL — ABNORMAL LOW (ref 4.22–5.81)
RDW: 12.1 % (ref 11.5–15.5)
WBC: 6.3 10*3/uL (ref 4.0–10.5)

## 2014-08-02 MED ORDER — NEOMYCIN-POLYMYXIN-HC 3.5-10000-1 OT SOLN: 4.0000 [drp] | Freq: Three times a day (TID) | OTIC | Status: DC

## 2014-08-02 NOTE — Patient Instructions (Signed)
OK to continue vitamin B complex.  For Toenail fungus, soak feet in distilled white vinegar and hot water 10 minutes each night and then apply vicks vapor rub to toenails at bedtime and lavendar oil in am There is a prescription called Jublia you apply topically  For pain try Cyclobenzaprine at bed for severe  Salon Pas patches twice a day Curcumen/turmeric capsule daily ([email or web address].com by Rayne) Tylenol ES 500 mg twice daily when pain is flared.  Sacroiliac Joint Dysfunction The sacroiliac joint connects the lower part of the spine (the sacrum) with the bones of the pelvis. CAUSES  Sometimes, there is no obvious reason for sacroiliac joint dysfunction. Other times, it may occur   During pregnancy.  After injury, such as:  Car accidents.  Sport-related injuries.  Work-related injuries.  Due to one leg being shorter than the other.  Due to other conditions that affect the joints, such as:  Rheumatoid arthritis.  Gout.  Psoriasis.  Joint infection (septic arthritis). SYMPTOMS  Symptoms may include:  Pain in the:  Lower back.  Buttocks.  Groin.  Thighs and legs.  Difficult sitting, standing, walking, lying, bending or lifting. DIAGNOSIS  A number of tests may be used to help diagnose the cause of sacroiliac joint dysfunction, including:  Imaging tests to look for other causes of pain, including:  MRI.  CT scan.  Bone scan.  Diagnostic injection: During a special x-ray (called fluoroscopy), a needle is put into the sacroiliac joint. A numbing medicine is injected into the joint. If the pain is improved or stopped, the diagnosis of sacroiliac joint dysfunction is more likely. TREATMENT  There are a number of types of treatment used for sacroiliac joint dysfunction, including:  Only take over-the-counter or prescription medicines for pain, discomfort, or fever as directed by your caregiver.  Medications to relax muscles.  Rest. Decreasing  activity can help cut down on painful muscle spasms and allow the back to heal.  Application of heat or ice to the lower back may improve muscle spasms and soothe pain.  Brace. A special back brace, called a sacroiliac belt, can help support the joint while your back is healing.  Physical therapy can help teach comfortable positions and exercises to strengthen muscles that support the sacroiliac joint.  Cortisone injections. Injections of steroid medicine into the joint can help decrease swelling and improve pain.  Hyaluronic acid injections. This chemical improves lubrication within the sacroiliac joint, thereby decreasing pain.  Radiofrequency ablation. A special needle is placed into the joint, where it burns away nerves that are carrying pain messages from the joint.  Surgery. Because pain occurs during movement of the joint, screws and plates may be installed in order to limit or prevent joint motion. HOME CARE INSTRUCTIONS   Take all medications exactly as directed.  Follow instructions regarding both rest and physical activity, to avoid worsening the pain.  Do physical therapy exercises exactly as prescribed. SEEK IMMEDIATE MEDICAL CARE IF:  You experience increasingly severe pain.  You develop new symptoms, such as numbness or tingling in your legs or feet.  You lose bladder or bowel control. Document Released: 11/30/2008 Document Revised: 11/26/2011 Document Reviewed: 11/30/2008 [facility] Patient Information 2015 [place], [place]. This information is not intended to replace advice given to you by your health care provider. Make sure you discuss any questions you have with your health care provider.

## 2014-08-02 NOTE — Progress Notes (Signed)
Pre visit review using our clinic review tool, if applicable. No additional management support is needed unless otherwise documented below in the visit note. 

## 2014-08-03 LAB — RENAL FUNCTION PANEL
ALBUMIN: 4 g/dL (ref 3.5–5.2)
BUN: 16 mg/dL (ref 6–23)
CHLORIDE: 108 meq/L (ref 96–112)
CO2: 24 meq/L (ref 19–32)
Calcium: 8.9 mg/dL (ref 8.4–10.5)
Creatinine, Ser: 1.2 mg/dL (ref 0.4–1.5)
GFR: 63.59 mL/min (ref >60.00)
Glucose, Bld: 90 mg/dL (ref 70–99)
PHOSPHORUS: 3.5 mg/dL (ref 2.3–4.6)
POTASSIUM: 4.5 meq/L (ref 3.5–5.1)
Sodium: 141 mEq/L (ref 135–145)

## 2014-08-03 LAB — HEPATIC FUNCTION PANEL
ALT: 19 U/L (ref 0–53)
AST: 24 U/L (ref 0–37)
Albumin: 4 g/dL (ref 3.5–5.2)
Alkaline Phosphatase: 66 U/L (ref 39–117)
BILIRUBIN DIRECT: 0.1 mg/dL (ref 0.0–0.3)
Total Bilirubin: 0.6 mg/dL (ref 0.2–1.2)
Total Protein: 6.7 g/dL (ref 6.0–8.3)

## 2014-08-03 LAB — LIPID PANEL
CHOLESTEROL: 151 mg/dL (ref 0–200)
HDL: 55.1 mg/dL (ref >39.00)
LDL Cholesterol: 81 mg/dL (ref 0–99)
NONHDL: 95.9
Total CHOL/HDL Ratio: 3
Triglycerides: 76 mg/dL (ref 0.0–149.0)
VLDL: 15.2 mg/dL (ref 0.0–40.0)

## 2014-08-03 LAB — TSH: TSH: 1.93 u[IU]/mL (ref 0.35–4.50)

## 2014-08-04 ENCOUNTER — Ambulatory Visit: Payer: Medicare Other | Admitting: Physician Assistant

## 2014-08-15 ENCOUNTER — Encounter: Payer: Self-pay | Admitting: Family Medicine

## 2014-08-15 NOTE — Assessment & Plan Note (Signed)
May try antibiotic drops and or H2O2 drops. Start a probiotic

## 2014-08-15 NOTE — Assessment & Plan Note (Signed)
Had a recent obstruction but is doing much better.

## 2014-08-15 NOTE — Assessment & Plan Note (Signed)
Well controlled, no changes to meds. Encouraged heart healthy diet such as the DASH diet and exercise as tolerated.  °

## 2014-08-15 NOTE — Progress Notes (Signed)
Paul Drake  [id number] 1946/09/11 08/15/2014      Progress Note-Follow Up  Subjective  Chief Complaint  Chief Complaint  Patient presents with  . Follow-up    6 month    HPI  Patient is a 68 y.o. male in today for routine medical care. Shoulder. Notes that she tries to stay active with yoga activity but is having trouble with movements of her arm. Denies any fall or injury. Pain is worse with internal rotation although she does have pain with external rotation as well. No weakness or radicular symptoms. Continues to struggle with grief and anxiety but it has been tolerable. Denies CP/palp/SOB/HA/congestion/fevers/GI or GU c/o. Taking meds as prescribed  Past Medical History  Diagnosis Date  . Kidney stones   . CVA (cerebral infarction)   . Depression   . Hyperlipemia   . Hypertension   . Chicken pox   . Seasonal allergies     some asthma  . Measles as a child  . Mumps as a child  . Allergic state 12/29/2013  . Unspecified asthma(493.90) 12/29/2013  . GERD (gastroesophageal reflux disease)   . Arthritis   . IBS (irritable bowel syndrome)   . Otitis externa 12/10/2013    Past Surgical History  Procedure Laterality Date  . Lithotripsy    . Wisdom tooth extraction    . Colonoscopy      Family History  Problem Relation Age of Onset  . Hypertension Mother   . Hyperlipidemia Mother   . Fibromyalgia Mother   . Arthritis Mother     rheumatoid  . Diabetes Sister     type 2  . Hyperlipidemia Brother   . Hypertension Brother   . Ulcers Father 36    Bleeding Ulcers  . Cancer Maternal Uncle     prostate  . Kidney Stones Daughter   . Asthma Daughter   . Healthy Son   . Cancer Maternal Grandmother 30    ?  Marland Kitchen Cancer Maternal Grandfather     skin ?    History   Social History  . Marital Status: Married    Spouse Name: N/A    Number of Children: N/A  . Years of Education: N/A   Occupational History  . Not on file.   Social History Main Topics  .  Smoking status: Never Smoker   . Smokeless tobacco: Never Used  . Alcohol Use: Yes     Comment: 1-2 daily  . Drug Use: No  . 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 Not on file   Social History Narrative    Current Outpatient Prescriptions on File Prior to Visit  Medication Sig Dispense Refill  . albuterol (PROVENTIL HFA;VENTOLIN HFA) 108 (90 BASE) MCG/ACT inhaler Inhale 2 puffs into the lungs every 6 (six) hours as needed for wheezing or shortness of breath. 1 Inhaler 3  . aspirin 325 MG tablet Take 325 mg by mouth daily.    . Cetirizine HCl (ZYRTEC ALLERGY PO) Take 10 mg by mouth daily.     . fluticasone (FLONASE) 50 MCG/ACT nasal spray Place into both nostrils daily.    Marland Kitchen losartan (COZAAR) 100 MG tablet TAKE 1 TABLET BY MOUTH EVERY DAY 90 tablet 0  . montelukast (SINGULAIR) 10 MG tablet Take 1 tablet (10 mg total) by mouth at bedtime. 30 tablet 5  . Multiple Vitamins-Minerals (MULTIVITAMINS THER. W/MINERALS) TABS tablet Take  1 tablet by mouth daily.    . Omega-3 Fatty Acids (OMEGA-3 FISH OIL) 1200 MG CAPS Take 1,200 mg by mouth daily.    Marland Kitchen omeprazole (PRILOSEC) 40 MG capsule Take 1 capsule (40 mg total) by mouth daily. 90 capsule 3  . OVER THE COUNTER MEDICATION Take 8 mg by mouth daily. Rapiflo    . Potassium Citrate 15 MEQ (1620 MG) TBCR Take 1 tablet by mouth 2 (two) times daily.    . simvastatin (ZOCOR) 20 MG tablet      No current facility-administered medications on file prior to visit.    No Known Allergies  Review of Systems  Review of Systems  Constitutional: Negative for fever and malaise/fatigue.  HENT: Negative for congestion.   Eyes: Negative for discharge.  Respiratory: Negative for shortness of breath.   Cardiovascular: Negative for chest pain, palpitations and leg swelling.  Gastrointestinal: Negative for nausea, abdominal pain and diarrhea.  Genitourinary: Negative for  dysuria.  Musculoskeletal: Negative for falls.  Skin: Negative for rash.  Neurological: Negative for loss of consciousness and headaches.  Endo/Heme/Allergies: Negative for polydipsia.  Psychiatric/Behavioral: Negative for depression and suicidal ideas. The patient is not nervous/anxious and does not have insomnia.     Objective  BP 125/74 mmHg  Pulse 58  Temp(Src) 98.3 F (36.8 C) (Oral)  Ht 5\' 8"  (1.727 m)  Wt 182 lb 9.6 oz (82.827 kg)  BMI 27.77 kg/m2  SpO2 99%  Physical Exam  Physical Exam  Constitutional: He is oriented to person, place, and time and well-developed, well-nourished, and in no distress. No distress.  HENT:  Head: Normocephalic and atraumatic.  Eyes: Conjunctivae are normal.  Neck: Neck supple. No thyromegaly present.  Cardiovascular: Normal rate, regular rhythm and normal heart sounds.   No murmur heard. Pulmonary/Chest: Effort normal and breath sounds normal. No respiratory distress.  Abdominal: He exhibits no distension and no mass. There is no tenderness.  Musculoskeletal: He exhibits no edema.  Neurological: He is alert and oriented to person, place, and time.  Skin: Skin is warm.  Psychiatric: Memory, affect and judgment normal.    Lab Results  Component Value Date   TSH 1.93 08/02/2014   Lab Results  Component Value Date   WBC 6.3 08/02/2014   HGB 13.1 08/02/2014   HCT 39.7 08/02/2014   MCV 96.7 08/02/2014   PLT 169.0 08/02/2014   Lab Results  Component Value Date   CREATININE 1.2 08/02/2014   BUN 16 08/02/2014   NA 141 08/02/2014   K 4.5 08/02/2014   CL 108 08/02/2014   CO2 24 08/02/2014   Lab Results  Component Value Date   ALT 19 08/02/2014   AST 24 08/02/2014   ALKPHOS 66 08/02/2014   BILITOT 0.6 08/02/2014   Lab Results  Component Value Date   CHOL 151 08/02/2014   Lab Results  Component Value Date   HDL 55.10 08/02/2014   Lab Results  Component Value Date   LDLCALC 81 08/02/2014   Lab Results  Component  Value Date   TRIG 76.0 08/02/2014   Lab Results  Component Value Date   CHOLHDL 3 08/02/2014     Assessment & Plan  Essential hypertension, benign Well controlled, no changes to meds. Encouraged heart healthy diet such as the DASH diet and exercise as tolerated.   GERD (gastroesophageal reflux disease) Avoid offending foods, start probiotics. Do not eat large meals in late evening and consider raising head of bed.   BPH (benign prostatic hyperplasia)  Had a recent obstruction but is doing much better.   Otitis externa May try antibiotic drops and or H2O2 drops. Start a probiotic  Hyperlipidemia Tolerating statin, encouraged heart healthy diet, avoid trans fats, minimize simple carbs and saturated fats. Increase exercise as tolerated

## 2014-08-15 NOTE — Assessment & Plan Note (Signed)
Avoid offending foods, start probiotics. Do not eat large meals in late evening and consider raising head of bed.  

## 2014-08-15 NOTE — Assessment & Plan Note (Signed)
Tolerating statin, encouraged heart healthy diet, avoid trans fats, minimize simple carbs and saturated fats. Increase exercise as tolerated 

## 2014-08-16 ENCOUNTER — Ambulatory Visit (INDEPENDENT_AMBULATORY_CARE_PROVIDER_SITE_OTHER): Payer: Medicare Other | Admitting: Physician Assistant

## 2014-08-16 ENCOUNTER — Encounter: Payer: Self-pay | Admitting: Physician Assistant

## 2014-08-16 VITALS — BP 120/70 | HR 80 | Ht 68.0 in | Wt 183.2 lb

## 2014-08-16 DIAGNOSIS — K2100 Gastro-esophageal reflux disease with esophagitis, without bleeding: Secondary | ICD-10-CM

## 2014-08-16 DIAGNOSIS — K589 Irritable bowel syndrome without diarrhea: Secondary | ICD-10-CM | POA: Diagnosis not present

## 2014-08-16 DIAGNOSIS — K2270 Barrett's esophagus without dysplasia: Secondary | ICD-10-CM | POA: Diagnosis not present

## 2014-08-16 DIAGNOSIS — M545 Low back pain, unspecified: Secondary | ICD-10-CM | POA: Insufficient documentation

## 2014-08-16 DIAGNOSIS — M5440 Lumbago with sciatica, unspecified side: Secondary | ICD-10-CM | POA: Diagnosis not present

## 2014-08-16 MED ORDER — OMEPRAZOLE 40 MG PO CPDR: 40.0000 mg | DELAYED_RELEASE_CAPSULE | Freq: Two times a day (BID) | ORAL | Status: DC

## 2014-08-16 MED ORDER — CYCLOBENZAPRINE HCL 10 MG PO TABS: 10.0000 mg | ORAL_TABLET | Freq: Two times a day (BID) | ORAL | Status: DC

## 2014-08-16 NOTE — Progress Notes (Signed)
Reviewed and agree with management plan.  Jaquia Benedicto T. Laverne Klugh, MD FACG 

## 2014-08-16 NOTE — Patient Instructions (Signed)
We have sent medications to your pharmacy for you to pick up at your convenience. Increase your omeprazole to 40 mg twice daily. Follow up with your PCP regarding your back pain. Follow up with Cecille Rubin in 8 weeks. CC:  Penni Homans MD                Gastroesophageal Reflux Disease, Adult Gastroesophageal reflux disease (GERD) happens when acid from your stomach goes into your food pipe (esophagus). The acid can cause a burning feeling in your chest. Over time, the acid can make small holes (ulcers) in your food pipe.  HOME CARE  Ask your doctor for advice about:  Losing weight.  Quitting smoking.  Alcohol use.  Avoid foods and drinks that make your problems worse. You may want to avoid:  Caffeine and alcohol.  Chocolate.  Mints.  Garlic and onions.  Spicy foods.  Citrus fruits, such as oranges, lemons, or limes.  Foods that contain tomato, such as sauce, chili, salsa, and pizza.  Fried and fatty foods.  Avoid lying down for 3 hours before you go to bed or before you take a nap.  Eat small meals often, instead of large meals.  Wear loose-fitting clothing. Do not wear anything tight around your waist.  Raise (elevate) the head of your bed 6 to 8 inches with wood blocks. Using extra pillows does not help.  Only take medicines as told by your doctor.  Do not take aspirin or ibuprofen. GET HELP RIGHT AWAY IF:   You have pain in your arms, neck, jaw, teeth, or back.  Your pain gets worse or changes.  You feel sick to your stomach (nauseous), throw up (vomit), or sweat (diaphoresis).  You feel short of breath, or you pass out (faint).  Your throw up is green, yellow, black, or looks like coffee grounds or blood.  Your poop (stool) is red, bloody, or black. MAKE SURE YOU:   Understand these instructions.  Will watch your condition.  Will get help right away if you are not doing well or get worse. Document Released: 02/20/2008 Document Revised:  11/26/2011 Document Reviewed: 03/23/2011 [facility] Patient Information 2015 [place], [place]. This information is not intended to replace advice given to you by your health care provider. Make sure you discuss any questions you have with your health care provider.

## 2014-08-16 NOTE — Progress Notes (Signed)
Patient ID: Paul Drake, male   DOB: October 17, 1945, 68 y.o.   MRN: [id number]     History of Present Illness: This is a follow-up for this 68 year old male who was last seen in the office in late October. At the time he had complained of a 6-8 week history of epigastric pain and nausea. He was also having erratic bowel movements he was given a trial of Flagyl 250 mg 3 times daily for 10 days. He was also given a bowel purge and advised to use Mira lax as needed. Since then his stools have been regular and occurring on a daily basis with only occasional use of Mira lax.  He underwent an EGD on 07/23/2014. The Z line was variable and he was noted to have gastritis in the gastric body and fundus. Surgical pathology was negative for H. pylori. Biopsies from the Z line were consistent with Barrett's esophagus and he received a letter in the mail advising him to have a repeat EGD in 3 years. He continues to use omeprazole 40 mg by mouth every morning, but despite this has a burning in his throat. He has some nocturnal regurgitation and has a nocturnal cough. He notes that if he has any alcohol, carbonated beverages, or cigars he has worsening reflux. He has no further epigastric pain or nausea.  He does report that on Thanksgiving morning he reached into the back of his car to lift a large [place] out for his daughter and felt a pull in his back. He has a long-standing history of sciatica intermittently for which he would use ibuprofen or Aleve and Flexeril as needed. His last prescription of Flexeril was from his former primary care provider in [place] and had expired. However, he used 1 Flexeril at bedtime for the last several nights and feels it is somewhat helping, but he is out of it and is not able to get into his primary care providers over the next few days. He has no bowel or bladder symptoms. He has no lower extremity weakness. His low back pain is primarily in the left radiating into the left  buttock.   Past Medical History  Diagnosis Date  . Kidney stones   . CVA (cerebral infarction)   . Depression   . Hyperlipemia   . Hypertension   . Chicken pox   . Seasonal allergies     some asthma  . Measles as a child  . Mumps as a child  . Allergic state 12/29/2013  . Unspecified asthma(493.90) 12/29/2013  . GERD (gastroesophageal reflux disease)   . Arthritis   . IBS (irritable bowel syndrome)   . Otitis externa 12/10/2013    Past Surgical History  Procedure Laterality Date  . Lithotripsy      multiple times  . Wisdom tooth extraction    . Colonoscopy     Family History  Problem Relation Age of Onset  . Hypertension Mother   . Hyperlipidemia Mother   . Fibromyalgia Mother   . Arthritis Mother     rheumatoid  . Diabetes Sister     type 2  . Hyperlipidemia Brother   . Hypertension Brother   . Ulcers Father 36    Bleeding Ulcers  . Cancer Maternal Uncle     prostate  . Kidney Stones Daughter   . Asthma Daughter   . Healthy Son   . Cancer Maternal Grandfather     skin ?   History  Substance Use Topics  . Smoking status:  Never Smoker   . Smokeless tobacco: Never Used  . Alcohol Use: 0.0 oz/week    0 Not specified per week     Comment: 1-2 occasioinal   Current Outpatient Prescriptions  Medication Sig Dispense Refill  . albuterol (PROVENTIL HFA;VENTOLIN HFA) 108 (90 BASE) MCG/ACT inhaler Inhale 2 puffs into the lungs every 6 (six) hours as needed for wheezing or shortness of breath. 1 Inhaler 3  . aspirin 325 MG tablet Take 325 mg by mouth daily.    . Cetirizine HCl (ZYRTEC ALLERGY PO) Take 10 mg by mouth daily.     . fluticasone (FLONASE) 50 MCG/ACT nasal spray Place into both nostrils daily.    Marland Kitchen losartan (COZAAR) 100 MG tablet TAKE 1 TABLET BY MOUTH EVERY DAY 90 tablet 0  . Multiple Vitamins-Minerals (MULTIVITAMINS THER. W/MINERALS) TABS tablet Take 1 tablet by mouth daily.    . Omega-3 Fatty Acids (OMEGA-3 FISH OIL) 1200 MG CAPS Take 1,200 mg by  mouth daily.    Marland Kitchen omeprazole (PRILOSEC) 40 MG capsule Take 1 capsule (40 mg total) by mouth 2 (two) times daily. 180 capsule 3  . OVER THE COUNTER MEDICATION Take 8 mg by mouth daily. Rapiflo    . Potassium Citrate 15 MEQ (1620 MG) TBCR Take 1 tablet by mouth 2 (two) times daily.    . simvastatin (ZOCOR) 20 MG tablet     . cyclobenzaprine (FLEXERIL) 10 MG tablet Take 1 tablet (10 mg total) by mouth 2 (two) times daily. 10 tablet 0   No current facility-administered medications for this visit.   No Known Allergies    Review of Systems: Gen: Denies any fever, chills, sweats, anorexia, fatigue, weakness, malaise, weight loss, and sleep disorder CV: Denies chest pain, angina, palpitations, syncope, orthopnea, PND, peripheral edema, and claudication. Resp: Denies dyspnea at rest, dyspnea with exercise, cough, sputum, wheezing, coughing up blood, and pleurisy. GI: Denies vomiting blood, jaundice, and fecal incontinence.   Denies dysphagia or odynophagia. GU : Denies urinary burning, blood in urine, urinary frequency, urinary hesitancy, nocturnal urination, and urinary incontinence. MS: Denies joint pain, limitation of movement, and swelling, stiffness, low back pain, extremity pain. Denies muscle weakness, cramps, atrophy.  Derm: Denies rash, itching, dry skin, hives, moles, warts, or unhealing ulcers.  Psych: Denies depression, anxiety, memory loss, suicidal ideation, hallucinations, paranoia, and confusion. Heme: Denies bruising, bleeding, and enlarged lymph nodes. Neuro:  Denies any headaches, dizziness, paresthesia Endo:  Denies any problems with DM, thyroid, adrenal  LAB RESULTS: Path from EGD 07/23/14 Diagnosis 1. Surgical [P], fundus and body, biopsy - BENIGN GASTRIC MUCOSA. NO HELICOBACTER PYLORI, INTESTINAL METAPLASIA OR ACTIVE INFLAMMATION IDENTIFIED. 2. Surgical [P], z-line, biopsy - INTESTINAL METAPLASIA (GOBLET CELL METAPLASIA) CONSISTENT WITH BARRETT'S ESOPHAGUS. NO DYSPLASIA  OR MALIGNANCY IDENTIFIED.  Studies:   ENDOSCOPIC IMPRESSION: 1. The z-line was variable; multiple biopsies performed 2. Gastritis in the gastric body and fundus; multiple biopsies performed 3. The EGD otherwise appeared normal RECOMMENDATIONS: 1. Anti-reflux regimen 2. Await pathology results 3. Continue PPI daily 4. Call to schedule a follow-up appointment with gi clinic 1 month   Physical Exam: General: Pleasant, well developed , male in no acute distress Head: Normocephalic and atraumatic Eyes:  sclerae anicteric, conjunctiva pink  Ears: Normal auditory acuity Lungs: Clear throughout to auscultation Heart: Regular rate and rhythm Abdomen: Soft, non distended, non-tender. No masses, no hepatomegaly. Normal bowel sounds Spine: no obvious bony deformity, atrophy, spasm, mild TTP left lumbar paras, Pain with ext over 40, no pain with  flex or rotation, Musculoskeletal: Symmetrical with no gross deformities  Extremities: No edema  Neurological: Alert oriented x 4, LE strength 5/5, SLR neg, able to toe/heel walk Psychological:  Alert and cooperative. Normal mood and affect  Assessment and Recommendations: #1 GERD, Barrett's esophagus. The patient has been experiencing some nocturnal reflux and a burning in his throat. He has been reminded to adhere to an antireflux regimen. He will increase his omeprazole to 40 mg by mouth twice a day 30 minutes before breakfast and 30 minutes before supper. He will return in 6-8 weeks for reevaluation at which time his omeprazole will be decreased back to once daily if his symptoms have diminished. He will have a repeat EGD in 3 years to reassess his segment of Barrett's.   #2. IBS. He is doing very well after a bowel purge and a trial of Flagyl. He will continue Mira lax as needed.  #3. Sciatica. He has been advised to contact his primary care provider for a follow-up for this problem. In the interim he has been given a prescription for Flexeril 10 mg  1 by mouth twice a day 5 days with no refill. He was advised that all further refills would have to come for his primary care provider and he is agreeable with this. He is aware to use caution operating machinery and to avoid use of alcohol while on Flexeril.        Calem Cocozza, Vita Barley PA-C 08/16/2014,

## 2014-09-15 ENCOUNTER — Other Ambulatory Visit: Payer: Self-pay | Admitting: Family Medicine

## 2014-09-16 NOTE — Telephone Encounter (Signed)
Rx sent to the pharmacy by e-script.//AB/CMA 

## 2014-09-20 ENCOUNTER — Inpatient Hospital Stay (HOSPITAL_BASED_OUTPATIENT_CLINIC_OR_DEPARTMENT_OTHER)
Admission: EM | Admit: 2014-09-20 | Discharge: 2014-09-22 | DRG: 062 | Disposition: A | Discharge status: Home / Self Care | Payer: Medicare Other | Attending: Neurology | Admitting: Neurology

## 2014-09-20 ENCOUNTER — Encounter (HOSPITAL_BASED_OUTPATIENT_CLINIC_OR_DEPARTMENT_OTHER): Payer: Self-pay | Admitting: *Deleted

## 2014-09-20 ENCOUNTER — Emergency Department (HOSPITAL_BASED_OUTPATIENT_CLINIC_OR_DEPARTMENT_OTHER): Payer: Medicare Other

## 2014-09-20 DIAGNOSIS — J45909 Unspecified asthma, uncomplicated: Secondary | ICD-10-CM | POA: Diagnosis present

## 2014-09-20 DIAGNOSIS — Q211 Atrial septal defect: Secondary | ICD-10-CM

## 2014-09-20 DIAGNOSIS — Z8673 Personal history of transient ischemic attack (TIA), and cerebral infarction without residual deficits: Secondary | ICD-10-CM | POA: Diagnosis not present

## 2014-09-20 DIAGNOSIS — I639 Cerebral infarction, unspecified: Secondary | ICD-10-CM | POA: Diagnosis not present

## 2014-09-20 DIAGNOSIS — I10 Essential (primary) hypertension: Secondary | ICD-10-CM | POA: Diagnosis present

## 2014-09-20 DIAGNOSIS — R05 Cough: Secondary | ICD-10-CM

## 2014-09-20 DIAGNOSIS — K219 Gastro-esophageal reflux disease without esophagitis: Secondary | ICD-10-CM | POA: Diagnosis present

## 2014-09-20 DIAGNOSIS — I6523 Occlusion and stenosis of bilateral carotid arteries: Secondary | ICD-10-CM | POA: Diagnosis present

## 2014-09-20 DIAGNOSIS — R202 Paresthesia of skin: Secondary | ICD-10-CM

## 2014-09-20 DIAGNOSIS — Z79899 Other long term (current) drug therapy: Secondary | ICD-10-CM | POA: Diagnosis not present

## 2014-09-20 DIAGNOSIS — E785 Hyperlipidemia, unspecified: Secondary | ICD-10-CM | POA: Diagnosis present

## 2014-09-20 DIAGNOSIS — I82441 Acute embolism and thrombosis of right tibial vein: Secondary | ICD-10-CM | POA: Diagnosis present

## 2014-09-20 DIAGNOSIS — I739 Peripheral vascular disease, unspecified: Secondary | ICD-10-CM | POA: Diagnosis present

## 2014-09-20 DIAGNOSIS — I6340 Cerebral infarction due to embolism of unspecified cerebral artery: Secondary | ICD-10-CM | POA: Diagnosis not present

## 2014-09-20 DIAGNOSIS — Z8249 Family history of ischemic heart disease and other diseases of the circulatory system: Secondary | ICD-10-CM

## 2014-09-20 DIAGNOSIS — K589 Irritable bowel syndrome without diarrhea: Secondary | ICD-10-CM | POA: Diagnosis present

## 2014-09-20 DIAGNOSIS — R4789 Other speech disturbances: Secondary | ICD-10-CM | POA: Diagnosis not present

## 2014-09-20 DIAGNOSIS — M1990 Unspecified osteoarthritis, unspecified site: Secondary | ICD-10-CM | POA: Diagnosis present

## 2014-09-20 DIAGNOSIS — R200 Anesthesia of skin: Secondary | ICD-10-CM

## 2014-09-20 DIAGNOSIS — R4781 Slurred speech: Secondary | ICD-10-CM | POA: Diagnosis not present

## 2014-09-20 DIAGNOSIS — I340 Nonrheumatic mitral (valve) insufficiency: Secondary | ICD-10-CM | POA: Diagnosis not present

## 2014-09-20 DIAGNOSIS — I82402 Acute embolism and thrombosis of unspecified deep veins of left lower extremity: Secondary | ICD-10-CM | POA: Diagnosis not present

## 2014-09-20 DIAGNOSIS — R059 Cough, unspecified: Secondary | ICD-10-CM

## 2014-09-20 LAB — COMPREHENSIVE METABOLIC PANEL
ALK PHOS: 74 U/L (ref 39–117)
ALT: 17 U/L (ref 0–53)
AST: 26 U/L (ref 0–37)
Albumin: 4.3 g/dL (ref 3.5–5.2)
Anion gap: 8 (ref 5–15)
BUN: 16 mg/dL (ref 6–23)
CALCIUM: 9.4 mg/dL (ref 8.4–10.5)
CO2: 27 mmol/L (ref 19–32)
Chloride: 106 mEq/L (ref 96–112)
Creatinine, Ser: 1.33 mg/dL (ref 0.50–1.35)
GFR calc Af Amer: 61 mL/min — ABNORMAL LOW (ref >90)
GFR, EST NON AFRICAN AMERICAN: 53 mL/min — AB (ref >90)
GLUCOSE: 101 mg/dL — AB (ref 70–99)
POTASSIUM: 3.4 mmol/L — AB (ref 3.5–5.1)
SODIUM: 141 mmol/L (ref 135–145)
TOTAL PROTEIN: 7.6 g/dL (ref 6.0–8.3)
Total Bilirubin: 0.5 mg/dL (ref 0.3–1.2)

## 2014-09-20 LAB — I-STAT TROPONIN, ED: Troponin i, poc: 0.01 ng/mL (ref 0.00–0.08)

## 2014-09-20 LAB — ETHANOL: Alcohol, Ethyl (B): 102 mg/dL — ABNORMAL HIGH (ref 0–9)

## 2014-09-20 LAB — CBC
HCT: 41.6 % (ref 39.0–52.0)
HEMOGLOBIN: 13.6 g/dL (ref 13.0–17.0)
MCH: 31.9 pg (ref 26.0–34.0)
MCHC: 32.7 g/dL (ref 30.0–36.0)
MCV: 97.4 fL (ref 78.0–100.0)
PLATELETS: 153 10*3/uL (ref 150–400)
RBC: 4.27 MIL/uL (ref 4.22–5.81)
RDW: 12.3 % (ref 11.5–15.5)
WBC: 7.1 10*3/uL (ref 4.0–10.5)

## 2014-09-20 LAB — CBG MONITORING, ED
GLUCOSE-CAPILLARY: 94 mg/dL (ref 70–99)
Glucose-Capillary: 106 mg/dL — ABNORMAL HIGH (ref 70–99)

## 2014-09-20 LAB — DIFFERENTIAL
Basophils Absolute: 0.1 10*3/uL (ref 0.0–0.1)
Basophils Relative: 1 % (ref 0–1)
Eosinophils Absolute: 0.3 10*3/uL (ref 0.0–0.7)
Eosinophils Relative: 5 % (ref 0–5)
Lymphocytes Relative: 32 % (ref 12–46)
Lymphs Abs: 2.2 10*3/uL (ref 0.7–4.0)
MONO ABS: 0.7 10*3/uL (ref 0.1–1.0)
Monocytes Relative: 9 % (ref 3–12)
NEUTROS ABS: 3.8 10*3/uL (ref 1.7–7.7)
Neutrophils Relative %: 53 % (ref 43–77)

## 2014-09-20 LAB — PROTIME-INR
INR: 0.98 (ref 0.00–1.49)
PROTHROMBIN TIME: 13 s (ref 11.6–15.2)

## 2014-09-20 LAB — APTT: aPTT: 35 seconds (ref 24–37)

## 2014-09-20 MED ORDER — ALTEPLASE (STROKE) FULL DOSE INFUSION: 0.9000 mg/kg | Freq: Once | INTRAVENOUS | Status: DC

## 2014-09-20 MED ORDER — SENNOSIDES-DOCUSATE SODIUM 8.6-50 MG PO TABS
1.0000 | ORAL_TABLET | Freq: Every evening | ORAL | Status: DC | PRN
  Filled 2014-09-20: qty 1

## 2014-09-20 MED ORDER — SODIUM CHLORIDE 0.9 % IV SOLN
250.0000 mL | Freq: Once | INTRAVENOUS | Status: AC
  Administered 2014-09-20: 250 mL via INTRAVENOUS

## 2014-09-20 MED ORDER — ACETAMINOPHEN 650 MG RE SUPP: 650.0000 mg | RECTAL | Status: DC | PRN

## 2014-09-20 MED ORDER — LABETALOL HCL 5 MG/ML IV SOLN: 10.0000 mg | INTRAVENOUS | Status: DC | PRN

## 2014-09-20 MED ORDER — ACETAMINOPHEN 325 MG PO TABS
650.0000 mg | ORAL_TABLET | ORAL | Status: DC | PRN
  Administered 2014-09-21 (×2): 650 mg via ORAL
  Filled 2014-09-20 (×2): qty 2

## 2014-09-20 MED ORDER — STROKE: EARLY STAGES OF RECOVERY BOOK
Freq: Once | Status: AC
  Administered 2014-09-21
  Filled 2014-09-20: qty 1

## 2014-09-20 MED ORDER — PANTOPRAZOLE SODIUM 40 MG IV SOLR
40.0000 mg | Freq: Every day | INTRAVENOUS | Status: DC
  Administered 2014-09-20: 40 mg via INTRAVENOUS
  Filled 2014-09-20 (×2): qty 40

## 2014-09-20 MED ORDER — ALTEPLASE (STROKE) FULL DOSE INFUSION
0.9000 mg/kg | Freq: Once | INTRAVENOUS | Status: AC
  Administered 2014-09-20: 71 mg via INTRAVENOUS
  Filled 2014-09-20: qty 71

## 2014-09-20 MED ORDER — SODIUM CHLORIDE 0.9 % IV SOLN
INTRAVENOUS | Status: DC
Start: 2014-09-20 — End: 2014-09-21
  Administered 2014-09-21: 01:00:00 via INTRAVENOUS

## 2014-09-20 NOTE — Progress Notes (Signed)
Unit CM UR Completed by [facility] ED CM  W. Othniel Maret RN  

## 2014-09-20 NOTE — ED Provider Notes (Signed)
Patient sent here in transfer from [place] emergency department as code stroke. By Dr.Reynolds and me upon arrival of [facility]. Patient had left-sided weakness and trouble speaking. Speech has improved since arrival. Patient is alert Glasgow Coma Score 15. Patient administered TPA ordered by Dr. Doy Mince. He will be admitted to the stroke service  Orlie Dakin, MD 09/20/14 2220

## 2014-09-20 NOTE — H&P (Signed)
Admission H&P    Chief Complaint: Left sided weakness, difficulty with speech  HPI: Paul Drake is a LH 69 y.o. male with a history of stroke and LLE numbness/weakness who today was at home with family and had acute onset left sided weakness and difficulty with speech.  Was able to talk with family well at the dinner table.  When he got up to use the bathroom was unable to handle his belt buckle.  Also noted that he was unable to speak well.  Had work finding difficulty.  Patient presented immediately to [facility].  Was felt to be improving there and was transferred to [facility] for further evaluation.  Although patient had initial improvement continued to have significant difficulty using his LUE and was noted to have a facial droop as well.  Patient was concerned about his inability to use his left hand. Patient's previous stroke was in 2008 and the patient has a resultant LLE numbness and weakness.  Patient was on ASA but stopped it about a month ago due to gastritis.    Date last known well: Date: 09/20/2014 Time last known well: Time: 21:00 tPA Given: Yes  Past Medical History  Diagnosis Date  . Kidney stones   . CVA (cerebral infarction)   . Depression   . Hyperlipemia   . Hypertension   . Chicken pox   . Seasonal allergies     some asthma  . Measles as a child  . Mumps as a child  . Allergic state 12/29/2013  . Unspecified asthma(493.90) 12/29/2013  . GERD (gastroesophageal reflux disease)   . Arthritis   . IBS (irritable bowel syndrome)   . Otitis externa 12/10/2013    Past Surgical History  Procedure Laterality Date  . Lithotripsy      multiple times  . Wisdom tooth extraction    . Colonoscopy      Family History  Problem Relation Age of Onset  . Hypertension Mother   . Hyperlipidemia Mother   . Fibromyalgia Mother   . Arthritis Mother     rheumatoid  . Diabetes Sister     type 2  . Hyperlipidemia Brother   . Hypertension Brother   . Ulcers Father 36    Bleeding  Ulcers  . Cancer Maternal Uncle     prostate  . Kidney Stones Daughter   . Asthma Daughter   . Healthy Son   . Cancer Maternal Grandfather     skin ?   Social History:  reports that he has never smoked. He has never used smokeless tobacco. He reports that he drinks alcohol. He reports that he does not use illicit drugs.  Allergies: No Known Allergies  Medications: Current outpatient prescriptions:  albuterol (PROVENTIL HFA;VENTOLIN HFA) 108 (90 BASE) MCG/ACT inhaler, Inhale 2 puffs into the lungs every 6 (six) hours as needed for wheezing or shortness of breath., Disp: 1 Inhaler, Rfl: 3;   Cetirizine HCl (ZYRTEC ALLERGY PO), Take 10 mg by mouth daily. , Disp: , Rfl: ;   cyclobenzaprine (FLEXERIL) 10 MG tablet, Take 1 tablet (10 mg total) by mouth 2 (two) times daily., Disp: 10 tablet, Rfl: 0 fluticasone (FLONASE) 50 MCG/ACT nasal spray, Place into both nostrils daily., Disp: , Rfl: ;   losartan (COZAAR) 100 MG tablet, TAKE 1 TABLET BY MOUTH EVERY DAY, Disp: 90 tablet, Rfl: 1;   Multiple Vitamins-Minerals (MULTIVITAMINS THER. W/MINERALS) TABS tablet, Take 1 tablet by mouth daily., Disp: , Rfl: ;  Omega-3 Fatty Acids (OMEGA-3 FISH OIL)  1200 MG CAPS, Take 1,200 mg by mouth daily., Disp: , Rfl:  omeprazole (PRILOSEC) 40 MG capsule, Take 1 capsule (40 mg total) by mouth 2 (two) times daily., Disp: 180 capsule, Rfl: 3;  OVER THE COUNTER MEDICATION, Take 8 mg by mouth daily. Rapiflo, Disp: , Rfl: ;   Potassium Citrate 15 MEQ (1620 MG) TBCR, Take 1 tablet by mouth 2 (two) times daily., Disp: , Rfl: ;   simvastatin (ZOCOR) 20 MG tablet, , Disp: , Rfl:   ROS: History obtained from the patient  General ROS: negative for - chills, fatigue, fever, night sweats, weight gain or weight loss Psychological ROS: negative for - behavioral disorder, hallucinations, memory difficulties, mood swings or suicidal ideation Ophthalmic ROS: negative for - blurry vision, double vision, eye pain or loss of  vision ENT ROS: negative for - epistaxis, nasal discharge, oral lesions, sore throat, tinnitus or vertigo Allergy and Immunology ROS: negative for - hives or itchy/watery eyes Hematological and Lymphatic ROS: negative for - bleeding problems, bruising or swollen lymph nodes Endocrine ROS: negative for - galactorrhea, hair pattern changes, polydipsia/polyuria or temperature intolerance Respiratory ROS: negative for - cough, hemoptysis, shortness of breath or wheezing Cardiovascular ROS: negative for - chest pain, dyspnea on exertion, edema or irregular heartbeat Gastrointestinal ROS: as noted in HPI Genito-Urinary ROS: negative for - dysuria, hematuria, incontinence or urinary frequency/urgency Musculoskeletal ROS: negative for - joint swelling or muscular weakness Neurological ROS: as noted in HPI Dermatological ROS: negative for rash and skin lesion changes  Physical Examination: Blood pressure 136/84, pulse 67, temperature 98.6 F (37 C), temperature source Oral, resp. rate 22, height 5\' 9"  (1.753 m), weight 79.379 kg (175 lb), SpO2 99 %.  General Examination:  HEENT-  Normocephalic, no lesions, without obvious abnormality.  Normal external eye and conjunctiva.  Normal TM's bilaterally.  Normal auditory canals and external ears. Normal external nose, mucus membranes and septum.  Normal pharynx. Cardiovascular- S1, S2 normal, pulses palpable throughout   Lungs- chest clear, no wheezing, rales, normal symmetric air entry Abdomen- soft, non-tender; bowel sounds normal; no masses,  no organomegaly Extremities- no edema Lymph-no adenopathy palpable Musculoskeletal-no joint tenderness, deformity or swelling Skin-warm and dry, no hyperpigmentation, vitiligo, or suspicious lesions  Neurological Examination Mental Status: Alert, oriented, thought content appropriate.  Speech fluent without evidence of aphasia.  Able to follow 3 step commands without difficulty. Cranial Nerves: II: Discs flat  bilaterally; Visual fields grossly normal, pupils equal, round, reactive to light and accommodation III,IV, VI: ptosis not present, extra-ocular motions intact bilaterally V,VII: decrease in left NLF, facial light touch sensation normal bilaterally VIII: hearing normal bilaterally IX,X: gag reflex present XI: bilateral shoulder shrug XII: midline tongue extension Motor: Right : Upper extremity   5/5    Left:     Upper extremity   5-/5  Lower extremity   5/5     Lower extremity   5-/5 Tone and bulk:normal tone throughout; no atrophy noted Sensory: Pinprick and light touch decreased in the LUE and LLE Deep Tendon Reflexes: 2+ in the upper extremities, absent at the knees and 1+ at the ankles Plantars: Right: mute   Left: mute Cerebellar: Dysmetria with finger-to-nose and heel-to-shin testing on the left   Laboratory Studies:   Basic Metabolic Panel:  Recent Labs Lab 09/20/14 2118  NA 141  K 3.4*  CL 106  CO2 27  GLUCOSE 101*  BUN 16  CREATININE 1.33  CALCIUM 9.4    Liver Function Tests:  Recent Labs  Lab 09/20/14 2118  AST 26  ALT 17  ALKPHOS 74  BILITOT 0.5  PROT 7.6  ALBUMIN 4.3   No results for input(s): LIPASE, AMYLASE in the last 168 hours. No results for input(s): AMMONIA in the last 168 hours.  CBC:  Recent Labs Lab 09/20/14 2118  WBC 7.1  NEUTROABS 3.8  HGB 13.6  HCT 41.6  MCV 97.4  PLT 153    Cardiac Enzymes: No results for input(s): CKTOTAL, CKMB, CKMBINDEX, TROPONINI in the last 168 hours.  BNP: Invalid input(s): POCBNP  CBG:  Recent Labs Lab 09/20/14 2122  GLUCAP 106*    Microbiology: Results for orders placed or performed in visit on 01/21/14  Urine Culture     Status: None   Collection Time: 01/21/14  8:37 AM  Result Value Ref Range Status   Colony Count NO GROWTH  Final   Organism ID, Bacteria NO GROWTH  Final    Coagulation Studies:  Recent Labs  09/20/14 2118  LABPROT 13.0  INR 0.98    Urinalysis: No results  for input(s): COLORURINE, LABSPEC, PHURINE, GLUCOSEU, HGBUR, BILIRUBINUR, KETONESUR, PROTEINUR, UROBILINOGEN, NITRITE, LEUKOCYTESUR in the last 168 hours.  Invalid input(s): APPERANCEUR  Lipid Panel:     Component Value Date/Time   CHOL 151 08/02/2014 1400   TRIG 76.0 08/02/2014 1400   HDL 55.10 08/02/2014 1400   CHOLHDL 3 08/02/2014 1400   VLDL 15.2 08/02/2014 1400   LDLCALC 81 08/02/2014 1400    HgbA1C: No results found for: HGBA1C  Urine Drug Screen:  No results found for: LABOPIA, COCAINSCRNUR, LABBENZ, AMPHETMU, THCU, LABBARB  Alcohol Level: No results for input(s): ETH in the last 168 hours.  Other results: EKG: normal sinus rhythm at 79 bpm.  Imaging: Ct Head (brain) Wo Contrast  09/20/2014   CLINICAL DATA:  Code stroke. Slurred speech and left hand weakness. Initial encounter.  EXAM: CT HEAD WITHOUT CONTRAST  TECHNIQUE: Contiguous axial images were obtained from the base of the skull through the vertex without intravenous contrast.  COMPARISON:  None.  FINDINGS: There is no evidence of acute infarction, mass lesion, or intra- or extra-axial hemorrhage on CT.  A chronic infarct is noted at the high right parietal region, with associated encephalomalacia. Prominence of the ventricles and sulci reflects mild cortical volume loss. Mild periventricular white matter change likely reflects small vessel ischemic microangiopathy. Mild cerebellar atrophy is noted.  The brainstem and fourth ventricle are within normal limits. The basal ganglia are unremarkable in appearance. No mass effect or midline shift is seen.  There is no evidence of fracture; visualized osseous structures are unremarkable in appearance. The orbits are within normal limits. The paranasal sinuses and mastoid air cells are well-aerated. No significant soft tissue abnormalities are seen.  IMPRESSION: 1. No acute intracranial pathology seen on CT. 2. Chronic infarct at the high right parietal region, with associated  encephalomalacia. 3. Mild cortical volume loss and scattered small vessel ischemic microangiopathy.  These results were called by telephone at the time of interpretation on 09/20/2014 at 9:32 pm to Dr. Debby Freiberg, who verbally acknowledged these results.   Electronically Signed   By: Garald Balding M.D.   On: 09/20/2014 21:33    Assessment: 69 y.o. male presenting with complaints of difficulty with speech and left upper extremity numbness/weakness.  Off ASA and on no antiplatelet or anticoagulant therapy at home.  Head CT personally reviewed and shows no acute changes.  Old right parietal infarct noted.  Patient was not initially treated  with tPA due to symptoms improving.  On arrival to [facility] though patient was still symptomatic and enough so that the patient was concerned about disability due to the fact that he was left handed.  NIHSS was 4.  Risks and benefits of tPA were discussed with patient.  Contraindications reviewed.  Verbal consent obtained.  tPA administered.  Patient to be admitted to ICU.    Stroke Risk Factors - hyperlipidemia and hypertension  Plan: 1. HgbA1c, fasting lipid panel 2. MRI, MRA  of the brain without contrast 3. PT consult, OT consult, Speech consult 4. Echocardiogram 5. Carotid dopplers 6. Prophylactic therapy-None 7. NPO until RN stroke swallow screen 8. Telemetry monitoring 9. Frequent neuro checks 10. Repeat head CT in 24 hours  Case discussed with Dr. Cyndee Brightly, MD [facility] [phone] 09/20/2014, 10:28 PM

## 2014-09-20 NOTE — ED Notes (Addendum)
Pt arrived by [facility] to exam room; Dr.Reynolds at bedside. Pt c/o L arm numbness and inability to unbutton pants at home. Pt with hx of stroke; reports taking ASA, stopped taking ASA x 1 month go due to gastritis. Speech clear; L arm weakness noted without drift

## 2014-09-20 NOTE — ED Notes (Signed)
Pt reports L hand numbness and tingling which has been present since 2130; tpa in process; no improvements noted at this time. Pt with weak grip to L hand; states "I can't feel you touching me."

## 2014-09-20 NOTE — ED Notes (Signed)
Pharmacy contacted for tPA dosage per Dr.Reynolds

## 2014-09-20 NOTE — ED Provider Notes (Signed)
CSN: [id number]     Arrival date & time 09/20/14  2103 History   None    Chief Complaint  Patient presents with  . Code Stroke     (Consider location/radiation/quality/duration/timing/severity/associated sxs/prior Treatment) Patient is a 69 y.o. male presenting with neurologic complaint.  Neurologic Problem This is a new problem. Episode onset: 2100. The problem occurs constantly. The problem has not changed since onset.Pertinent negatives include no chest pain, no abdominal pain, no headaches and no shortness of breath. Nothing aggravates the symptoms. Nothing relieves the symptoms. He has tried nothing for the symptoms.   Patient presents with abrupt onset of left upper extremity lack of coordination, word finding difficulties, sensory deficit. He has a residual deficit from a prior stroke of left lower cavity weakness and sensory loss. He denies recent illness, had alcohol tonight but states that he has not increased the amount of alcohol use on a daily basis. No recent trauma. Past Medical History  Diagnosis Date  . Kidney stones   . CVA (cerebral infarction)   . Depression   . Hyperlipemia   . Hypertension   . Chicken pox   . Seasonal allergies     some asthma  . Measles as a child  . Mumps as a child  . Allergic state 12/29/2013  . Unspecified asthma(493.90) 12/29/2013  . GERD (gastroesophageal reflux disease)   . Arthritis   . IBS (irritable bowel syndrome)   . Otitis externa 12/10/2013   Past Surgical History  Procedure Laterality Date  . Lithotripsy      multiple times  . Wisdom tooth extraction    . Colonoscopy     Family History  Problem Relation Age of Onset  . Hypertension Mother   . Hyperlipidemia Mother   . Fibromyalgia Mother   . Arthritis Mother     rheumatoid  . Diabetes Sister     type 2  . Hyperlipidemia Brother   . Hypertension Brother   . Ulcers Father 36    Bleeding Ulcers  . Cancer Maternal Uncle     prostate  . Kidney Stones Daughter   .  Asthma Daughter   . Healthy Son   . Cancer Maternal Grandfather     skin ?   History  Substance Use Topics  . Smoking status: Never Smoker   . Smokeless tobacco: Never Used  . Alcohol Use: 0.0 oz/week    0 Not specified per week     Comment: 1-2 occasioinal    Review of Systems  Respiratory: Negative for shortness of breath.   Cardiovascular: Negative for chest pain.  Gastrointestinal: Negative for abdominal pain.  Neurological: Negative for headaches.  All other systems reviewed and are negative.     Allergies  Review of patient's allergies indicates no known allergies.  Home Medications   Prior to Admission medications   Medication Sig Start Date End Date Taking? Authorizing Provider  albuterol (PROVENTIL HFA;VENTOLIN HFA) 108 (90 BASE) MCG/ACT inhaler Inhale 2 puffs into the lungs every 6 (six) hours as needed for wheezing or shortness of breath. 12/29/13   Mosie Lukes, MD  aspirin 325 MG tablet Take 325 mg by mouth daily.    Historical Provider, MD  Cetirizine HCl (ZYRTEC ALLERGY PO) Take 10 mg by mouth daily.     Historical Provider, MD  cyclobenzaprine (FLEXERIL) 10 MG tablet Take 1 tablet (10 mg total) by mouth 2 (two) times daily. 08/16/14   Lori P Hvozdovic, PA-C  fluticasone (FLONASE) 50 MCG/ACT nasal  spray Place into both nostrils daily.    Historical Provider, MD  losartan (COZAAR) 100 MG tablet TAKE 1 TABLET BY MOUTH EVERY DAY 09/16/14   Mosie Lukes, MD  Multiple Vitamins-Minerals (MULTIVITAMINS THER. W/MINERALS) TABS tablet Take 1 tablet by mouth daily.    Historical Provider, MD  Omega-3 Fatty Acids (OMEGA-3 FISH OIL) 1200 MG CAPS Take 1,200 mg by mouth daily.    Historical Provider, MD  omeprazole (PRILOSEC) 40 MG capsule Take 1 capsule (40 mg total) by mouth 2 (two) times daily. 08/16/14   Lori P Hvozdovic, PA-C  OVER THE COUNTER MEDICATION Take 8 mg by mouth daily. Rapiflo    Historical Provider, MD  Potassium Citrate 15 MEQ (1620 MG) TBCR Take 1 tablet  by mouth 2 (two) times daily.    Historical Provider, MD  simvastatin (ZOCOR) 20 MG tablet  10/09/13   Historical Provider, MD   BP 153/88 mmHg  Pulse 76  Temp(Src) 98 F (36.7 C) (Oral)  Resp 18  Ht 5\' 9"  (1.753 m)  Wt 175 lb (79.379 kg)  BMI 25.83 kg/m2  SpO2 99% Physical Exam  Constitutional: He is oriented to person, place, and time. He appears well-developed and well-nourished.  HENT:  Head: Normocephalic and atraumatic.  Eyes: Conjunctivae and EOM are normal.  Neck: Normal range of motion. Neck supple.  Cardiovascular: Normal rate, regular rhythm and normal heart sounds.   Pulmonary/Chest: Effort normal and breath sounds normal. No respiratory distress.  Abdominal: He exhibits no distension. There is no tenderness. There is no rebound and no guarding.  Musculoskeletal: Normal range of motion.  Neurological: He is alert and oriented to person, place, and time. A sensory deficit (LUE subjectively without sensation) is present. No cranial nerve deficit. He exhibits abnormal muscle tone (5-/5 L side, 5/5 R). GCS eye subscore is 4. GCS verbal subscore is 5. GCS motor subscore is 6.  Skin: Skin is warm and dry.  Vitals reviewed.   ED Course  Procedures (including critical care time) Labs Review Labs Reviewed  CBG MONITORING, ED - Abnormal; Notable for the following:    Glucose-Capillary 106 (*)    All other components within normal limits  PROTIME-INR  APTT  CBC  DIFFERENTIAL  COMPREHENSIVE METABOLIC PANEL  I-STAT CHEM 8, ED  I-STAT TROPOININ, ED    Imaging Review No results found.   EKG Interpretation None      MDM   Final diagnoses:  Numbness and tingling in left arm    69 y.o. male with pertinent PMH of prior circle of willis CVA with residual LLE sensory loss and mild weakness, HTN, pyelonephritis presents with abrupt onset of lack of coordination in LUE with sensory loss as well as word finding difficulty.  Pt does admit to drinking 2 "old fashioneds",  however states he drinks an identical amount every night. On arrival pt has vitals and physical exam as above.  Symptoms atypical for CVA, however pt had atypical CVA in the past.  Code stroke called.  Spoke with neurology, Dr. Doy Mince, and as a transfer vehicle was outside, pt transferred immediately after my examination.  I informed [facility] ED provider of the pt prior to transfer.    I have reviewed all laboratory and imaging studies if ordered as above  1. Numbness and tingling in left arm         Debby Freiberg, MD 09/20/14 2130

## 2014-09-20 NOTE — ED Notes (Signed)
Pt reports that he went to the bathroom his left hand would not work.  pts speech noted to be slurred, hard time getting words out.  Pts wife reports that his hand was contracted up and pt was unable to move it.  Pt has no sensation in his (L) hand.  No facial droop noted.

## 2014-09-21 ENCOUNTER — Encounter (HOSPITAL_COMMUNITY): Payer: Self-pay | Admitting: *Deleted

## 2014-09-21 ENCOUNTER — Inpatient Hospital Stay (HOSPITAL_COMMUNITY): Payer: Medicare Other

## 2014-09-21 DIAGNOSIS — I10 Essential (primary) hypertension: Secondary | ICD-10-CM

## 2014-09-21 DIAGNOSIS — E785 Hyperlipidemia, unspecified: Secondary | ICD-10-CM

## 2014-09-21 DIAGNOSIS — I639 Cerebral infarction, unspecified: Secondary | ICD-10-CM

## 2014-09-21 DIAGNOSIS — Z8673 Personal history of transient ischemic attack (TIA), and cerebral infarction without residual deficits: Secondary | ICD-10-CM

## 2014-09-21 LAB — MRSA PCR SCREENING: MRSA by PCR: NEGATIVE

## 2014-09-21 LAB — LIPID PANEL
Cholesterol: 154 mg/dL (ref 0–200)
HDL: 49 mg/dL (ref >39)
LDL CALC: 60 mg/dL (ref 0–99)
Total CHOL/HDL Ratio: 3.1 RATIO
Triglycerides: 226 mg/dL — ABNORMAL HIGH (ref <150)
VLDL: 45 mg/dL — ABNORMAL HIGH (ref 0–40)

## 2014-09-21 LAB — HEMOGLOBIN A1C
Hgb A1c MFr Bld: 5.7 % — ABNORMAL HIGH (ref <5.7)
Mean Plasma Glucose: 117 mg/dL — ABNORMAL HIGH (ref <117)

## 2014-09-21 MED ORDER — HEPARIN SODIUM (PORCINE) 5000 UNIT/ML IJ SOLN
5000.0000 [IU] | Freq: Three times a day (TID) | INTRAMUSCULAR | Status: DC
  Administered 2014-09-22 (×2): 5000 [IU] via SUBCUTANEOUS
  Filled 2014-09-21 (×5): qty 1

## 2014-09-21 MED ORDER — ALBUTEROL SULFATE HFA 108 (90 BASE) MCG/ACT IN AERS: 2.0000 | INHALATION_SPRAY | Freq: Four times a day (QID) | RESPIRATORY_TRACT | Status: DC | PRN

## 2014-09-21 MED ORDER — NON FORMULARY: 40.0000 mg | Freq: Two times a day (BID) | Status: DC

## 2014-09-21 MED ORDER — CLOPIDOGREL BISULFATE 75 MG PO TABS
75.0000 mg | ORAL_TABLET | Freq: Every day | ORAL | Status: DC
  Administered 2014-09-22: 75 mg via ORAL
  Filled 2014-09-21 (×2): qty 1

## 2014-09-21 MED ORDER — LORATADINE 10 MG PO TABS
10.0000 mg | ORAL_TABLET | Freq: Every day | ORAL | Status: DC
Start: 2014-09-21 — End: 2014-09-22
  Filled 2014-09-21 (×2): qty 1

## 2014-09-21 MED ORDER — FLUTICASONE PROPIONATE 50 MCG/ACT NA SUSP
1.0000 | Freq: Every day | NASAL | Status: DC
  Administered 2014-09-21: 1 via NASAL
  Filled 2014-09-21: qty 16

## 2014-09-21 MED ORDER — ALBUTEROL SULFATE (2.5 MG/3ML) 0.083% IN NEBU: 2.5000 mg | INHALATION_SOLUTION | Freq: Four times a day (QID) | RESPIRATORY_TRACT | Status: DC | PRN

## 2014-09-21 MED ORDER — OMEPRAZOLE 20 MG PO CPDR
40.0000 mg | DELAYED_RELEASE_CAPSULE | Freq: Two times a day (BID) | ORAL | Status: DC
  Administered 2014-09-21 (×2): 40 mg via ORAL
  Filled 2014-09-21 (×4): qty 2

## 2014-09-21 MED ORDER — CETIRIZINE HCL 10 MG PO TABS
10.0000 mg | ORAL_TABLET | Freq: Every day | ORAL | Status: DC
  Administered 2014-09-21: 10 mg via ORAL
  Filled 2014-09-21 (×2): qty 1

## 2014-09-21 MED ORDER — CYCLOBENZAPRINE HCL 10 MG PO TABS
10.0000 mg | ORAL_TABLET | Freq: Two times a day (BID) | ORAL | Status: DC
  Administered 2014-09-21: 10 mg via ORAL
  Filled 2014-09-21 (×4): qty 1

## 2014-09-21 MED ORDER — SIMETHICONE 80 MG PO CHEW
80.0000 mg | CHEWABLE_TABLET | Freq: Three times a day (TID) | ORAL | Status: DC | PRN
  Administered 2014-09-21: 80 mg via ORAL
  Filled 2014-09-21 (×2): qty 1

## 2014-09-21 MED ORDER — CITALOPRAM HYDROBROMIDE 20 MG PO TABS
20.0000 mg | ORAL_TABLET | Freq: Every day | ORAL | Status: DC
  Administered 2014-09-21: 20 mg via ORAL
  Filled 2014-09-21 (×2): qty 1

## 2014-09-21 MED ORDER — SIMVASTATIN 20 MG PO TABS
20.0000 mg | ORAL_TABLET | Freq: Every day | ORAL | Status: DC
  Administered 2014-09-21: 20 mg via ORAL
  Filled 2014-09-21 (×2): qty 1

## 2014-09-21 NOTE — Progress Notes (Signed)
PT Cancellation Note  Patient Details Name: Cabell Lazenby MRN: [id number] DOB: 1945/12/17   Cancelled Treatment:    Reason Eval/Treat Not Completed: Patient not medically ready.  Pt on strict bedrest post tpa.  Will hold PT at this time and await updated activity orders when pt appropriate for mobility.     Anelisse Jacobson, Thornton Papas 09/21/2014, 7:40 AM

## 2014-09-21 NOTE — Progress Notes (Signed)
Chaplain initiated visit with pt and family. Chaplain informed pt and family of her services should they need them. Page chaplain as needed.    09/21/14 1000  Clinical Encounter Type  Visited With Patient and family together  Visit Type Initial;Spiritual support  Spiritual Encounters  Spiritual Needs Emotional  Paul Drake, Epifanio Lesches 09/21/2014 10:55 AM

## 2014-09-21 NOTE — Progress Notes (Signed)
OT Cancellation Note  Patient Details Name: Paul Drake MRN: [id number] DOB: 15-Apr-1946   Cancelled Treatment:    Reason Eval/Treat Not Completed: Other (comment) Pt with bedrest orders. Please update activity orders when appropriate in order for therapy to begin. Thanks. [place], OTR/L  [phone] 09/21/2014 09/21/2014, 7:55 AM

## 2014-09-21 NOTE — Progress Notes (Signed)
Occupational Therapy Treatment Patient Details Name: Paul Drake MRN: [id number] DOB: Feb 26, 1946 Today's Date: 09/21/2014    History of present illness pt presents with L sided weakness UE>LE.  pt with hx of R Parietal Infarct.  CT - for CVA. +tpa. MRI ordered.   OT comments  Pt seen again this pm to educate pt on use of tubing for utensils/toothbrush to improve functional use of L hand and increase patient's ability to feed self. Began educating pt on theraputty activities. Pt very appreciative. Continue OT recommend neuro outpt OT.   Follow Up Recommendations  Outpatient OT;Supervision - Intermittent    Equipment Recommendations  None recommended by OT    Recommendations for Other Services      Precautions / Restrictions Precautions Precautions: None Restrictions Weight Bearing Restrictions: No       Mobility   Balance    ADL  Eating/Feeding: Set up Eating/Feeding Details (indicate cue type and reason): given built up tubing for utensils     General ADL Comments: limited by coordination and sensory defiicits            Praxis Praxis Praxis tested?: Deficits Deficits: Limb apraxia          Exercises Other Exercises Other Exercises: mod resistance theraputty - strengthening and coordination activities Other Exercises: squeez ball - coordination and control activities Other Exercises: Educated on completing smooth, controlled movement patterns - using visual feedback   Shoulder Instructions       General Comments      Pertinent Vitals/ Pain       Pain Assessment: No/denies pain  Home Living Family/patient expects to be discharged to:: Private residence Living Arrangements: Spouse/significant other Available Help at Discharge: Family;Available PRN/intermittently Type of Home: House Home Access: Level entry     Home Layout: Two level;1/2 bath on main level Alternate Level Stairs-Number of Steps: flight   Bathroom Shower/Tub: Medical illustrator: Standard Bathroom Accessibility: Yes   Home Equipment: None      Lives With: Spouse    Prior Functioning/Environment Level of Independence: Independent        Comments: pt walks 6miles a day and plays golf regularly.     Frequency Min 2X/week     Progress Toward Goals  OT Goals(current goals can now be found in the care plan section)  Progress towards OT goals: Progressing toward goals  Acute Rehab OT Goals Patient Stated Goal: to be able to use my hand again OT Goal Formulation: With patient Time For Goal Achievement: 10/05/14 Potential to Achieve Goals: Good ADL Goals Pt Will Perform Eating: with set-up;with adaptive utensils Pt/caregiver will Perform Home Exercise Program: Left upper extremity;With theraputty;With written HEP provided;With Supervision (fine motor/coordination)  Plan Discharge plan remains appropriate    Co-evaluation                 End of Session     Activity Tolerance Patient tolerated treatment well   Patient Left in bed;with call bell/phone within reach;with family/visitor present   Nurse Communication Mobility status        Time: 1191-4782 OT Time Calculation (min): 16 min  Charges: OT General Charges $OT Visit: 1 Procedure OT Treatments $Therapeutic Activity: 8-22 mins  Issai Werling,[name] 09/21/2014, 1:15 PM   [facility], OTR/L  [phone] 09/21/2014

## 2014-09-21 NOTE — Progress Notes (Signed)
Occupational Therapy Evaluation Patient Details Name: Paul Drake MRN: [id number] DOB: 08/04/1946 Today's Date: 09/21/2014    History of Present Illness pt presents with L sided weakness UE>LE.  pt with hx of R Parietal Infarct.  CT - for CVA. +tpa. MRI ordered.   Clinical Impression   PTA, pt independent with ADL and mobility and was very active physically. Pt presents with LUE sensory and coordination deficits and will need to follow up with OT at the neuro outpt center. Will follow acutely to increase functional use of LUE and establish HEP for D/C    Follow Up Recommendations  Neuro- Outpatient OT;Supervision - Intermittent (neuro outpt)    Equipment Recommendations  None recommended by OT    Recommendations for Other Services       Precautions / Restrictions Precautions Precautions: None Restrictions Weight Bearing Restrictions: No      Mobility Bed Mobility Overal bed mobility: Modified Independent                Transfers Overall transfer level: Modified independent Equipment used: None                  Balance Overall balance assessment: Modified Independent;No apparent balance deficits (not formally assessed)                               Standardized Balance Assessment Standardized Balance Assessment : Dynamic Gait Index   Dynamic Gait Index Level Surface: Normal Change in Gait Speed: Normal Gait with Horizontal Head Turns: Normal Gait with Vertical Head Turns: Normal Gait and Pivot Turn: Normal Step Over Obstacle: Normal Step Around Obstacles: Normal      ADL Overall ADL's : Needs assistance/impaired Eating/Feeding: Set up Eating/Feeding Details (indicate cue type and reason): Pt unable to feed self with L hand Grooming: Set up Grooming Details (indicate cue type and reason): using R hand Upper Body Bathing: Set up;Sitting   Lower Body Bathing: Set up;Sit to/from stand   Upper Body Dressing : Minimal  assistance Upper Body Dressing Details (indicate cue type and reason): difficulty with fasteners/zippers Lower Body Dressing: Minimal assistance Lower Body Dressing Details (indicate cue type and reason): difficulty with coordinating movment with donning socks Toilet Transfer: Supervision/safety;Ambulation   Toileting- Clothing Manipulation and Hygiene: Modified independent;Sit to/from stand       Functional mobility during ADLs: Supervision/safety General ADL Comments: limited by coordination and sensory defiicits     Vision    Wears glasses at all times No apparent visual deficits                 Perception Perception Comments: will further assess   Praxis Praxis Praxis tested?: Deficits Deficits: Limb apraxia    Pertinent Vitals/Pain Pain Assessment: No/denies pain     Hand Dominance Left   Extremity/Trunk Assessment Upper Extremity Assessment Upper Extremity Assessment: LUE deficits/detail LUE Deficits / Details: AROM and strength appears WFL. poor coordinaiton and sensory deificits LUE Sensation: decreased proprioception;decreased light touch LUE Coordination: decreased fine motor;decreased gross motor  decreased stereognosis Lower Extremity Assessment - defer to Pt eval      Cervical / Trunk Assessment Cervical / Trunk Assessment: Normal   Communication Communication Communication: No difficulties   Cognition Arousal/Alertness: Awake/alert Behavior During Therapy: WFL for tasks assessed/performed Overall Cognitive Status: Within Functional Limits for tasks assessed                     General  Comments       Exercises  Discussed need to use visual feedback to help control movement patterns and using "soft and controlled" to improve functional movement patterns; gross motor coordination taks; discussed safety concerns and need for knowing where L hand is at all times     Shoulder Instructions      Home Living Family/patient expects to  be discharged to:: Private residence Living Arrangements: Spouse/significant other Available Help at Discharge: Family;Available PRN/intermittently Type of Home: House Home Access: Level entry     Home Layout: Two level;1/2 bath on main level Alternate Level Stairs-Number of Steps: flight   Bathroom Shower/Tub: Occupational psychologist: Standard Bathroom Accessibility: Yes   Home Equipment: None          Prior Functioning/Environment Level of Independence: Independent        Comments: pt walks 64miles a day and plays golf regularly.      OT Diagnosis: Generalized weakness;Other (comment) (sensory deficits)   OT Problem List: Decreased range of motion;Decreased knowledge of use of DME or AE;Impaired sensation;Impaired UE functional use;Decreased safety awareness   OT Treatment/Interventions: Self-care/ADL training;Therapeutic exercise;Neuromuscular education;DME and/or AE instruction;Therapeutic activities;Patient/family education    OT Goals(Current goals can be found in the care plan section) Acute Rehab OT Goals Patient Stated Goal: to be able to use my hand again OT Goal Formulation: With patient Time For Goal Achievement: 10/05/14 Potential to Achieve Goals: Good  OT Frequency: Min 2X/week   Barriers to D/C:            Co-evaluation              End of Session Nurse Communication: Mobility status;Other (comment) (D/C needs)  Activity Tolerance: Patient tolerated treatment well Patient left: in chair;with call bell/phone within reach;with family/visitor present   Time: 6734-1937 OT Time Calculation (min): 20 min Charges:  OT General Charges $OT Visit: 1 Procedure OT Evaluation $Initial OT Evaluation Tier I: 1 Procedure OT Treatments $Therapeutic Activity: 8-22 mins G-Codes:    Paul Drake,Paul Drake Oct 02, 2014, 11:56 AM   Paul Drake, OTR/L  [phone] 2014-10-02

## 2014-09-21 NOTE — Evaluation (Signed)
Speech Language Pathology Evaluation Patient Details Name: Paul Drake MRN: [id number] DOB: [birth year] Today's Date: 09/21/2014 Time: 9147-8295 SLP Time Calculation (min) (ACUTE ONLY): 16 min  Problem List:  Patient Active Problem List   Diagnosis Date Noted  . Stroke 09/20/2014  . Barrett's esophagus 08/16/2014  . Lumbago 08/16/2014  . IBS (irritable bowel syndrome) 08/16/2014  . Abdominal pain 07/06/2014  . Calculus of kidney 06/28/2014  . Urgency of micturation 04/28/2014  . Allergic state 12/29/2013  . Unspecified asthma(493.90) 12/29/2013  . GERD (gastroesophageal reflux disease)   . Otitis externa 12/10/2013  . Need for prophylactic vaccination with combined diphtheria-tetanus-pertussis (DTP) vaccine 11/15/2013  . Encounter to establish care 11/15/2013  . Essential hypertension, benign 11/15/2013  . BPH (benign prostatic hyperplasia) 11/15/2013  . Recurrent nephrolithiasis 11/15/2013  . Depression with anxiety 11/15/2013  . Hyperlipidemia 11/15/2013  . Anosmia 11/15/2013  . History of CVA (cerebrovascular accident) 11/15/2013  . CMC arthritis, thumb, degenerative 11/04/2013   Past Medical History:  Past Medical History  Diagnosis Date  . Kidney stones   . CVA (cerebral infarction)   . Depression   . Hyperlipemia   . Hypertension   . Chicken pox   . Seasonal allergies     some asthma  . Measles as a child  . Mumps as a child  . Allergic state 12/29/2013  . Unspecified asthma(493.90) 12/29/2013  . GERD (gastroesophageal reflux disease)   . Arthritis   . IBS (irritable bowel syndrome)   . Otitis externa 12/10/2013   Past Surgical History:  Past Surgical History  Procedure Laterality Date  . Lithotripsy      multiple times  . Wisdom tooth extraction    . Colonoscopy     HPI:  69 y.o. male with a history of stroke and LLE numbness/weakness, GERD, depression admitted following acute onset left sided weakness and difficulty with speech.CT revealed no acute  intracranial pathology seen on CT, chronic infarct at the high right parietal region, with associated encephalomalacia. MRI pending.   Assessment / Plan / Recommendation Clinical Impression  Pt and wife reported significant dysnomia and decreased semantic yesterday at time of incident. Today speech is fluent and intelligible without difficulty with self expression. He reported occasional hesitancy or dysnomia noted today. SLP provided therapeutic education re: strategies to facilitate communication if needed. Minimal prompt with higher level reading/executive function task, exhibiting functional cognitive abilities. No further ST needed.      SLP Assessment  Patient does not need any further Speech Lanaguage Pathology Services    Follow Up Recommendations  None    Frequency and Duration   N/A     Pertinent Vitals/Pain Pain Assessment: No/denies pain       SLP Evaluation Prior Functioning  Cognitive/Linguistic Baseline: Within functional limits Type of Home: House  Lives With: Spouse Available Help at Discharge: Family;Available PRN/intermittently Vocation: Retired   Associate Professor  Overall Cognitive Status: Within Functional Limits for tasks assessed Orientation Level: Oriented X4    Comprehension  Auditory Comprehension Overall Auditory Comprehension: Appears within functional limits for tasks assessed Visual Recognition/Discrimination Discrimination: Not tested Reading Comprehension Reading Status:  (no complaints per pt)    Expression Expression Primary Mode of Expression: Verbal Verbal Expression Overall Verbal Expression: Appears within functional limits for tasks assessed Written Expression Dominant Hand: Left Written Expression:  (stated "unable to hold pen")   Oral / Motor Oral Motor/Sensory Function Overall Oral Motor/Sensory Function: Impaired at baseline (right labial droop, wife states baseline before 1st stroke ) Motor  Speech Overall Motor Speech: Appears  within functional limits for tasks assessed Intelligibility: Intelligible Motor Planning: Witnin functional limits   GO     Houston Siren 09/21/2014, 1:15 PM   Orbie Pyo Colvin Caroli.Ed [employer] [phone]

## 2014-09-21 NOTE — Evaluation (Signed)
Physical Therapy Evaluation Patient Details Name: Paul Drake MRN: [id number] DOB: Aug 18, 1946 Today's Date: 09/21/2014   History of Present Illness  pt presents with L sided weakness UE>LE.  pt with hx of R Parietal Infarct.    Clinical Impression  Pt moving well and only indicates deficits with L hand and mild fatigue in L LE.  Pt able to recall error he made recalling presidents when talking to MD and told PT correct order of presidents.  Pt near baseline level of mobility and is well educated on returning to mobility from previous CVA.  No further PT needs at this time, will sign off.      Follow Up Recommendations No PT follow up;Supervision - Intermittent    Equipment Recommendations  None recommended by PT    Recommendations for Other Services       Precautions / Restrictions Precautions Precautions: None Restrictions Weight Bearing Restrictions: No      Mobility  Bed Mobility Overal bed mobility: Modified Independent                Transfers Overall transfer level: Modified independent Equipment used: None                Ambulation/Gait Ambulation/Gait assistance: Modified independent (Device/Increase time) Ambulation Distance (Feet): 300 Feet Assistive device: None Gait Pattern/deviations: Step-through pattern;Decreased stride length     General Gait Details: pt moving well and able to negotiate obstacles and accept balance challenges.  pt indicates LE beginning to fatigue at end of gait when normally he walks 2 miles.    Stairs            Wheelchair Mobility    Modified Rankin (Stroke Patients Only) Modified Rankin (Stroke Patients Only) Pre-Morbid Rankin Score: No significant disability Modified Rankin: Moderate disability     Balance Overall balance assessment: Modified Independent;No apparent balance deficits (not formally assessed)                               Standardized Balance Assessment Standardized Balance  Assessment : Dynamic Gait Index   Dynamic Gait Index Level Surface: Normal Change in Gait Speed: Normal Gait with Horizontal Head Turns: Normal Gait with Vertical Head Turns: Normal Gait and Pivot Turn: Normal Step Over Obstacle: Normal Step Around Obstacles: Normal       Pertinent Vitals/Pain Pain Assessment: No/denies pain    Home Living Family/patient expects to be discharged to:: Private residence Living Arrangements: Spouse/significant other Available Help at Discharge: Family;Available PRN/intermittently Type of Home: House Home Access: Level entry     Home Layout: Two level;1/2 bath on main level Home Equipment: None      Prior Function Level of Independence: Independent         Comments: pt walks 4miles a day and plays golf regularly.       Hand Dominance   Dominant Hand: Left    Extremity/Trunk Assessment   Upper Extremity Assessment: Defer to OT evaluation           Lower Extremity Assessment: LLE deficits/detail   LLE Deficits / Details: Grossly functional with strength 4+/5.  LE fatigues mroe quickly during activity.  pt indicates weakness from previous CVA, but LE fatigue new.    Cervical / Trunk Assessment: Normal  Communication   Communication: No difficulties  Cognition Arousal/Alertness: Awake/alert Behavior During Therapy: WFL for tasks assessed/performed Overall Cognitive Status: Within Functional Limits for tasks assessed  General Comments      Exercises        Assessment/Plan    PT Assessment Patent does not need any further PT services  PT Diagnosis Difficulty walking   PT Problem List    PT Treatment Interventions     PT Goals (Current goals can be found in the Care Plan section) Acute Rehab PT Goals PT Goal Formulation: All assessment and education complete, DC therapy    Frequency     Barriers to discharge        Co-evaluation               End of Session Equipment  Utilized During Treatment: Gait belt Activity Tolerance: Patient tolerated treatment well Patient left: in chair;with call bell/phone within reach;with family/visitor present Nurse Communication: Mobility status         Time: 7867-5449 PT Time Calculation (min) (ACUTE ONLY): 23 min   Charges:   PT Evaluation $Initial PT Evaluation Tier I: 1 Procedure PT Treatments $Gait Training: 8-22 mins   PT G CodesCatarina Drake, [place] 09/21/2014, 11:36 AM

## 2014-09-21 NOTE — Progress Notes (Signed)
VASCULAR LAB PRELIMINARY  PRELIMINARY  PRELIMINARY  PRELIMINARY  Carotid duplex  completed.    Preliminary report:  Bilateral:  1-39% ICA stenosis.  Vertebral artery flow is antegrade.      Elion Hocker, RVT 09/21/2014, 12:17 PM

## 2014-09-21 NOTE — Progress Notes (Signed)
UR completed.  Therapies recommending no post-acute follow up.   Sandi Mariscal, RN BSN [place] CCM Trauma/Neuro ICU Case Manager [phone]

## 2014-09-21 NOTE — Progress Notes (Signed)
    [facility] has been requested to perform a transesophageal echocardiogram on 01/06 for CVA.  After careful review of history and examination, the risks and benefits of transesophageal echocardiogram have been explained including risks of esophageal damage, perforation (1:10,000 risk), bleeding, pharyngeal hematoma as well as other potential complications associated with conscious sedation including aspiration, arrhythmia, respiratory failure and death. Alternatives to treatment were discussed, questions were answered. Patient is willing to proceed.   Rosaria Ferries, PA-C 09/21/2014 5:15 PM

## 2014-09-21 NOTE — Progress Notes (Signed)
STROKE TEAM PROGRESS NOTE   HISTORY OF PRESENT ILLNESS Paul Drake is a LH 69 y.o. male with a history of stroke and LLE numbness/weakness who today was at home with family and had acute onset left sided weakness and difficulty with speech. Was able to talk with family well at the dinner table. When he got up to use the bathroom was unable to handle his belt buckle. Also noted that he was unable to speak well. Had work finding difficulty. Patient presented immediately to [facility]. Was felt to be improving there and was transferred to [facility] for further evaluation. Although patient had initial improvement continued to have significant difficulty using his LUE and was noted to have a facial droop as well. Patient was concerned about his inability to use his left hand. Patient's previous stroke was in 2008 and the patient has a resultant LLE numbness and weakness. Patient was on ASA but stopped it about a month ago due to gastritis.   Date last known well: Date: 09/20/2014 Time last known well: Time: 21:00 tPA Given: Yes   SUBJECTIVE (INTERVAL HISTORY) No family is at the bedside.  Overall he feels his condition is stable. He stated that his left arm better than last night. Speech is also improving.   OBJECTIVE Temp:  [98 F (36.7 C)-98.6 F (37 C)] 98.1 F (36.7 C) (01/05 0400) Pulse Rate:  [52-86] 62 (01/05 1100) Cardiac Rhythm:  [-] Sinus bradycardia (01/05 0600) Resp:  [12-22] 21 (01/05 1100) BP: (111-157)/(65-95) 111/93 mmHg (01/05 1100) SpO2:  [96 %-100 %] 96 % (01/05 1100) Weight:  [175 lb (79.379 kg)] 175 lb (79.379 kg) (01/04 2123)   Recent Labs Lab 09/20/14 2122 09/20/14 2210  GLUCAP 106* 94    Recent Labs Lab 09/20/14 2118  NA 141  K 3.4*  CL 106  CO2 27  GLUCOSE 101*  BUN 16  CREATININE 1.33  CALCIUM 9.4    Recent Labs Lab 09/20/14 2118  AST 26  ALT 17  ALKPHOS 74  BILITOT 0.5  PROT 7.6  ALBUMIN 4.3    Recent Labs Lab 09/20/14 2118  WBC 7.1   NEUTROABS 3.8  HGB 13.6  HCT 41.6  MCV 97.4  PLT 153   No results for input(s): CKTOTAL, CKMB, CKMBINDEX, TROPONINI in the last 168 hours.  Recent Labs  09/20/14 2118  LABPROT 13.0  INR 0.98   No results for input(s): COLORURINE, LABSPEC, PHURINE, GLUCOSEU, HGBUR, BILIRUBINUR, KETONESUR, PROTEINUR, UROBILINOGEN, NITRITE, LEUKOCYTESUR in the last 72 hours.  Invalid input(s): APPERANCEUR     Component Value Date/Time   CHOL 154 09/21/2014 0232   TRIG 226* 09/21/2014 0232   HDL 49 09/21/2014 0232   CHOLHDL 3.1 09/21/2014 0232   VLDL 45* 09/21/2014 0232   LDLCALC 60 09/21/2014 0232   Lab Results  Component Value Date   HGBA1C 5.7* 09/21/2014   No results found for: LABOPIA, [place], [place], [place], [place], [place]   Recent Labs Lab 09/20/14 2157  ETH 102*    I have personally reviewed the radiological images below and agree with the radiology interpretations.  Ct Head (brain) Wo Contrast  09/20/2014   IMPRESSION: 1. No acute intracranial pathology seen on CT. 2. Chronic infarct at the high right parietal region, with associated encephalomalacia. 3. Mild cortical volume loss and scattered small vessel ischemic microangiopathy.    Carotid Doppler  Bilateral: 1-39% ICA stenosis. Vertebral artery flow is antegrade.  2D Echocardiogram  Pending  MRI pending  EKG  normal EKG, normal sinus  rhythm. For complete results please see formal report.  PHYSICAL EXAM  Temp:  [98 F (36.7 C)-98.6 F (37 C)] 98.1 F (36.7 C) (01/05 0400) Pulse Rate:  [52-86] 62 (01/05 1100) Resp:  [12-22] 21 (01/05 1100) BP: (111-157)/(65-95) 111/93 mmHg (01/05 1100) SpO2:  [96 %-100 %] 96 % (01/05 1100) Weight:  [175 lb (79.379 kg)] 175 lb (79.379 kg) (01/04 2123)  General - Well nourished, well developed, in no apparent distress.  Ophthalmologic - Sharp disc margins OU.  Cardiovascular - Regular rate and rhythm with no murmur.  Neck - supple, no carotid bruits  Mental Status -   Level of arousal and orientation to time, place, and person were intact. Language including expression, naming, repetition, comprehension was assessed and found intact. Fund of Knowledge was assessed and was intact.  Cranial Nerves II - XII - II - Visual field intact OU. III, IV, VI - Extraocular movements intact. V - Facial sensation intact bilaterally. VII - Facial movement intact bilaterally. VIII - Hearing & vestibular intact bilaterally. X - Palate elevates symmetrically. XI - Chin turning & shoulder shrug intact bilaterally. XII - Tongue protrusion intact.  Motor Strength - The patient's strength was normal in all extremities except left hand mild dexterity difficulty and pronator drift was absent.  Bulk was normal and fasciculations were absent.   Motor Tone - Muscle tone was assessed at the neck and appendages and was normal.  Reflexes - The patient's reflexes were symmetrical in all extremities and he had no pathological reflexes.  Sensory - Light touch, temperature/pinprick were assessed and were symmetrical.    Coordination - The patient had normal movements in the hands and feet with no ataxia or dysmetria.  Tremor was absent.  Gait and Station - deferred due to tPA protocol.   ASSESSMENT/PLAN [name] is a 69 y.o. male with history of HTN, HLD, previous stroke in 2008 with mild left hemiparesis admitted for left UE weakness and left facial droop, s/p tPA. Symptoms much improved.    Stroke most likely:  Likely non-dominant right MCA stroke - etiology not clear  MRI  pending  MRA  pending  Carotid Doppler  unremarkable  2D Echo  pending  LDL 60  HgbA1c 5.7  SCDs for VTE prophylaxis  Diet regular   off ASA due to gastritis prior to admission, now on no antithrombotics due to within 24h of tPA   Patient counseled to be compliant with his antithrombotic medications  Ongoing aggressive stroke risk factor management  Hx of stroke  2008 with LLE  residue - not able to run  CT showed right MCA encephalomalacia  Unknown etiology  Hypertension  Home meds:   losartan Permissive hypertension (OK if <220/120) for 24-48 hours post stroke and then gradually normalized within 5-7 days. Currently on no AEDs  Stable  Patient counseled to be compliant with his blood pressure medications  Hyperlipidemia  Home meds:  zocor 20   Currently on zocor 20  LDL 60, goal < 70  Continue statin at discharge  Other Stroke Risk Factors  Advanced age  Hx stroke  Hospital day # 1  Rosalin Hawking, MD PhD Stroke Neurology 09/21/2014 12:57 PM    To contact Stroke Continuity provider, please refer to [email or web address] After hours, contact General Neurology

## 2014-09-22 ENCOUNTER — Inpatient Hospital Stay (HOSPITAL_COMMUNITY): Payer: Medicare Other

## 2014-09-22 ENCOUNTER — Encounter (HOSPITAL_COMMUNITY): Payer: Self-pay

## 2014-09-22 ENCOUNTER — Encounter (HOSPITAL_COMMUNITY)
Admission: EM | Disposition: A | Discharge status: Home / Self Care | Payer: Self-pay | Source: Home / Self Care | Attending: Neurology

## 2014-09-22 DIAGNOSIS — I6340 Cerebral infarction due to embolism of unspecified cerebral artery: Secondary | ICD-10-CM

## 2014-09-22 DIAGNOSIS — I340 Nonrheumatic mitral (valve) insufficiency: Secondary | ICD-10-CM

## 2014-09-22 DIAGNOSIS — I82402 Acute embolism and thrombosis of unspecified deep veins of left lower extremity: Secondary | ICD-10-CM

## 2014-09-22 DIAGNOSIS — Q211 Atrial septal defect: Secondary | ICD-10-CM

## 2014-09-22 DIAGNOSIS — I639 Cerebral infarction, unspecified: Secondary | ICD-10-CM

## 2014-09-22 HISTORY — PX: TEE WITHOUT CARDIOVERSION: SHX5443

## 2014-09-22 LAB — ANTITHROMBIN III: AntiThromb III Func: 86 % (ref 75–120)

## 2014-09-22 SURGERY — ECHOCARDIOGRAM, TRANSESOPHAGEAL
Anesthesia: Moderate Sedation

## 2014-09-22 MED ORDER — SODIUM CHLORIDE 0.9 % IV SOLN
INTRAVENOUS | Status: DC
  Administered 2014-09-22: 500 mL via INTRAVENOUS

## 2014-09-22 MED ORDER — FENTANYL CITRATE 0.05 MG/ML IJ SOLN
INTRAMUSCULAR | Status: DC | PRN
  Administered 2014-09-22: 25 ug via INTRAVENOUS

## 2014-09-22 MED ORDER — MIDAZOLAM HCL 10 MG/2ML IJ SOLN
INTRAMUSCULAR | Status: DC | PRN
  Administered 2014-09-22: 1 mg via INTRAVENOUS
  Administered 2014-09-22: 2 mg via INTRAVENOUS

## 2014-09-22 MED ORDER — MIDAZOLAM HCL 5 MG/ML IJ SOLN
INTRAMUSCULAR | Status: AC
  Filled 2014-09-22: qty 2

## 2014-09-22 MED ORDER — BUTAMBEN-TETRACAINE-BENZOCAINE 2-2-14 % EX AERO
INHALATION_SPRAY | CUTANEOUS | Status: DC | PRN
  Administered 2014-09-22: 2 via TOPICAL

## 2014-09-22 MED ORDER — RIVAROXABAN 15 MG PO TABS
15.0000 mg | ORAL_TABLET | Freq: Two times a day (BID) | ORAL | Status: DC
  Administered 2014-09-22: 15 mg via ORAL
  Filled 2014-09-22 (×3): qty 1

## 2014-09-22 MED ORDER — RIVAROXABAN (XARELTO) VTE STARTER PACK (15 & 20 MG): ORAL_TABLET | ORAL | Status: DC

## 2014-09-22 MED ORDER — RIVAROXABAN 20 MG PO TABS: ORAL_TABLET | ORAL | Status: DC

## 2014-09-22 MED ORDER — FENTANYL CITRATE 0.05 MG/ML IJ SOLN
INTRAMUSCULAR | Status: AC
  Filled 2014-09-22: qty 2

## 2014-09-22 NOTE — Progress Notes (Signed)
*  Preliminary Results* Bilateral lower extremity venous duplex completed. The right lower extremity is positive for deep vein thrombosis involving the right gastrocnemius, posterior tibial, and peroneal veins. There is no evidence of left lower extremity deep vein thrombosis.  Preliminary results discussed with Dr. Erlinda Hong.  09/22/2014  Maudry Mayhew, RVT, RDCS, RDMS

## 2014-09-22 NOTE — CV Procedure (Signed)
   Transesophageal Echocardiogram: Indication:  CVA Sedation: Versed: 3, Fentanyl: 25, Other:   ASA: 2, Airway: 2  Procedure:  The patient was moderately sedated with the above doses of versed and fentanyl.  Using digital technique an omniplane probe was advanced into the distal esophagus without incident. Transgastric imaging revealed normal LV function with no RWMA;s and no mural apical thrombus.  .  Estimated ejection fraction was 55%.  Right sided cardiac chambers were normal with no evidence of pulmonary hypertension.  The pulmonary and tricuspid valves were structurally normal.  There was mils TR The mitral valve was structurally normal with trivial mitral regurgitation.    The aortic valve was trileaflet with mild AR The aortic root was normal.    Imaging of the septum showed no ASD or VSD Bubble study was negative for shunt 2D and color flow confirmed no PFO  The LAE was well visualized in orthogonal views.  There was no spontaneous contrast and no thrombus.    The descending thoracic aorta had no mural aortic debris with no evidence of aneurysmal dilation or disection  Impression:  1) PFO by bubble study not visualized Positive bubble study only with valsalva 2) Normal EF 60% 3) No LAA thrombus 4)  Trivial MR 5) Mild AR 6) No aortic debris 7) Mild TR 8) Normal RV  Jenkins Rouge 09/22/2014 9:54 AM

## 2014-09-22 NOTE — Interval H&P Note (Signed)
History and Physical Interval Note:  09/22/2014 8:59 AM  Paul Drake  has presented today for surgery, with the diagnosis of STROKE  The various methods of treatment have been discussed with the patient and family. After consideration of risks, benefits and other options for treatment, the patient has consented to  Procedure(s): TRANSESOPHAGEAL ECHOCARDIOGRAM (TEE) (N/A) as a surgical intervention .  The patient's history has been reviewed, patient examined, no change in status, stable for surgery.  I have reviewed the patient's chart and labs.  Questions were answered to the patient's satisfaction.     Jenkins Rouge

## 2014-09-22 NOTE — Progress Notes (Signed)
After the patient was discharged, the lab called to say they cannot use the homocysteine sample they drew.  The hospital switched labs this week so the types of tubes changed.

## 2014-09-22 NOTE — Progress Notes (Signed)
Pt with recommendation for Outpatient OT at d/c.  Will need a Rx for outpatient OT, with diagnosis written on Rx, and he can get therapy anywhere that accepts traditional Medicare coverage.   Sandi Mariscal, RN BSN [facility] CCM  Case Manager, Trauma Service/Unit 28M [phone]

## 2014-09-22 NOTE — Progress Notes (Signed)
  Echocardiogram 2D Echocardiogram has been performed.  Darlina Sicilian M 09/22/2014, 9:55 AM

## 2014-09-22 NOTE — Discharge Summary (Signed)
Stroke Discharge Summary  Patient ID: Paul Drake   MRN: [id number]      DOB: 1946/03/12  Date of Admission: 09/20/2014 Date of Discharge: 09/22/2014  Attending Physician:  No att. providers found, Stroke MD  Consulting Physician(s):     cardiology and rehabilitation medicine  Patient's PCP:  Penni Homans, MD  DISCHARGE DIAGNOSIS:  Active Problems:   Acute stroke   PFO   DVT, left   Chronic stroke   BMI: Body mass index is 25.83 kg/(m^2).  Past Medical History  Diagnosis Date  . Kidney stones   . CVA (cerebral infarction)   . Depression   . Hyperlipemia   . Hypertension   . Chicken pox   . Seasonal allergies     some asthma  . Measles as a child  . Mumps as a child  . Allergic state 12/29/2013  . Unspecified asthma(493.90) 12/29/2013  . GERD (gastroesophageal reflux disease)   . Arthritis   . IBS (irritable bowel syndrome)   . Otitis externa 12/10/2013   Past Surgical History  Procedure Laterality Date  . Lithotripsy      multiple times  . Wisdom tooth extraction    . Colonoscopy        Medication List    STOP taking these medications        OVER THE COUNTER MEDICATION      TAKE these medications        albuterol 108 (90 BASE) MCG/ACT inhaler  Commonly known as:  PROVENTIL HFA;VENTOLIN HFA  Inhale 2 puffs into the lungs every 6 (six) hours as needed for wheezing or shortness of breath.     citalopram 20 MG tablet  Commonly known as:  CELEXA  Take 20 mg by mouth daily.     cyclobenzaprine 10 MG tablet  Commonly known as:  FLEXERIL  Take 1 tablet (10 mg total) by mouth 2 (two) times daily.     fluticasone 50 MCG/ACT nasal spray  Commonly known as:  FLONASE  Place 1 spray into both nostrils daily.     losartan 100 MG tablet  Commonly known as:  COZAAR  Take 100 mg by mouth daily.     multivitamins ther. w/minerals Tabs tablet  Take 1 tablet by mouth daily.     Omega-3 Fish Oil 1200 MG Caps  Take 1,200 mg by mouth daily.     omeprazole  40 MG capsule  Commonly known as:  PRILOSEC  Take 1 capsule (40 mg total) by mouth 2 (two) times daily.     Potassium Citrate 15 MEQ (1620 MG) Tbcr  Take 1 tablet by mouth 2 (two) times daily.     RAPAFLO 8 MG Caps capsule  Generic drug:  silodosin  Take 8 mg by mouth daily with breakfast.     Rivaroxaban 15 & 20 MG Tbpk  Commonly known as:  XARELTO STARTER PACK  Take as directed on package: Start with one 15mg  tablet by mouth twice a day with food. On Day 22, switch to one 20mg  tablet once a day.     rivaroxaban 20 MG Tabs tablet  Commonly known as:  XARELTO  Take one 20mg  tablets once a day starting from 10/23/14 for 5 months.     simvastatin 20 MG tablet  Commonly known as:  ZOCOR  Take 20 mg by mouth daily.     ZYRTEC ALLERGY PO  Take 10 mg by mouth daily.        LABORATORY  STUDIES CBC    Component Value Date/Time   WBC 7.1 09/20/2014 2118   RBC 4.27 09/20/2014 2118   HGB 13.6 09/20/2014 2118   HCT 41.6 09/20/2014 2118   PLT 153 09/20/2014 2118   MCV 97.4 09/20/2014 2118   MCH 31.9 09/20/2014 2118   MCHC 32.7 09/20/2014 2118   RDW 12.3 09/20/2014 2118   LYMPHSABS 2.2 09/20/2014 2118   MONOABS 0.7 09/20/2014 2118   EOSABS 0.3 09/20/2014 2118   BASOSABS 0.1 09/20/2014 2118   CMP    Component Value Date/Time   NA 141 09/20/2014 2118   K 3.4* 09/20/2014 2118   CL 106 09/20/2014 2118   CO2 27 09/20/2014 2118   GLUCOSE 101* 09/20/2014 2118   BUN 16 09/20/2014 2118   CREATININE 1.33 09/20/2014 2118   CREATININE 1.11 12/29/2013 1145   CALCIUM 9.4 09/20/2014 2118   PROT 7.6 09/20/2014 2118   ALBUMIN 4.3 09/20/2014 2118   AST 26 09/20/2014 2118   ALT 17 09/20/2014 2118   ALKPHOS 74 09/20/2014 2118   BILITOT 0.5 09/20/2014 2118   GFRNONAA 53* 09/20/2014 2118   GFRAA 61* 09/20/2014 2118   COAGS Lab Results  Component Value Date   INR 0.98 09/20/2014   Lipid Panel    Component Value Date/Time   CHOL 154 09/21/2014 0232   TRIG 226* 09/21/2014 0232    HDL 49 09/21/2014 0232   CHOLHDL 3.1 09/21/2014 0232   VLDL 45* 09/21/2014 0232   LDLCALC 60 09/21/2014 0232   HgbA1C  Lab Results  Component Value Date   HGBA1C 5.7* 09/21/2014   Cardiac Panel (last 3 results) No results for input(s): CKTOTAL, CKMB, TROPONINI, RELINDX in the last 72 hours. Urinalysis    Component Value Date/Time   COLORURINE YELLOW 01/21/2014 0835   APPEARANCEUR CLEAR 01/21/2014 0835   LABSPEC 1.015 01/21/2014 0835   PHURINE 7.5 01/21/2014 0835   GLUCOSEU NEG 01/21/2014 0835   HGBUR NEG 01/21/2014 0835   BILIRUBINUR Neg 07/06/2014 1029   BILIRUBINUR NEG 01/21/2014 0835   KETONESUR NEG 01/21/2014 0835   PROTEINUR Trace 07/06/2014 1029   PROTEINUR NEG 01/21/2014 0835   UROBILINOGEN 0.2 07/06/2014 1029   UROBILINOGEN 0.2 01/21/2014 0835   NITRITE Neg 07/06/2014 1029   NITRITE NEG 01/21/2014 0835   LEUKOCYTESUR Trace 07/06/2014 1029   Urine Drug Screen No results found for: LABOPIA, COCAINSCRNUR, LABBENZ, AMPHETMU, THCU, LABBARB  Alcohol Level    Component Value Date/Time   [facility] 102* 09/20/2014 2157     SIGNIFICANT DIAGNOSTIC STUDIES Ct Head (brain) Wo Contrast  09/20/2014 IMPRESSION: 1. No acute intracranial pathology seen on CT. 2. Chronic infarct at the high right parietal region, with associated encephalomalacia. 3. Mild cortical volume loss and scattered small vessel ischemic microangiopathy.   Carotid Doppler Bilateral: 1-39% ICA stenosis. Vertebral artery flow is antegrade.  2D Echocardiogram Pending  MRI HEAD Moderate size remote infarct posterior right frontal -parietal lobe with encephalomalacia. Small to moderate size acute infarct along the anterior margin of this remote infarct involving the posterior right frontal lobe extending to the posterior right operculum region. No intracranial hemorrhage.  MRA HEAD Decrease number of visualized right middle cerebral artery branch vessels consistent with patient's remote and acute  infarct. No significant stenosis proximal to the branch vessel level.  Ectatic vertebral arteries and basilar artery with slight irregularity of the distal left vertebral artery without significant stenosis.  Nonvisualized right posterior inferior cerebellar artery.  Mild posterior cerebral artery branch vessel irregularity and narrowing more  notable on the right.   EKG normal EKG, normal sinus rhythm. For complete results please see formal report.   LE venous doppler - The right lower extremity is positive for deep vein thrombosis involving the right gastrocnemius, posterior tibial, and peroneal veins. There is no evidence of left lower extremity deep vein thrombosis.  TEE -  1) PFO by bubble study not visualized Positive bubble study only with valsalva 2) Normal EF 60% 3) No LAA thrombus 4) Trivial MR 5) Mild AR 6) No aortic debris 7) Mild TR 8) Normal RV    HISTORY OF PRESENT ILLNESS Paul Drake is a LH 69 y.o. male with a history of stroke and LLE numbness/weakness who today was at home with family and had acute onset left sided weakness and difficulty with speech. Was able to talk with family well at the dinner table. When he got up to use the bathroom was unable to handle his belt buckle. Also noted that he was unable to speak well. Had work finding difficulty. Patient presented immediately to [facility]. Was felt to be improving there and was transferred to [facility] for further evaluation. Although patient had initial improvement continued to have significant difficulty using his LUE and was noted to have a facial droop as well. Patient was concerned about his inability to use his left hand. Patient's previous stroke was in 2008 and the patient has a resultant LLE numbness and weakness. Patient was on ASA but stopped it about a month ago due to gastritis.   Date last known well: Date: 09/20/2014 Time last known well: Time: 21:00 tPA Given: Yes   HOSPITAL COURSE Pt  was admitted for left arm weakness and left facial droop. S/p tPA. His acute right frontal stroke is adjacent to your previous stroke in 2008 shown in MRI. Stroke work up showed PFO and you also have right LE DVT. She was put on Xarelto 15mg  twice a day for 21 days and then 20mg  daily for 5 months. We will see you in clinic in about 2 months. Pt was discharged in good condition.  Stroke most likely: Likely non-dominant right MCA stroke - etiology not clear  MRI right frontal acute stroke adjacent to previous stroke   MRA unremarkable  Carotid Doppler unremarkable  2D Echo pending  LDL 60  HgbA1c 5.7  SCDs for VTE prophylaxis  Diet regular   Found to have right DVT, will put on Xarelto.   Patient counseled to be compliant with his antithrombotic medications  DVT, left  Hypercoagulable work up  On Xarelto  Avoid sedentary life style.  PFO  symptomtic  Pt recently has a lot of valsalva movements  On Xareto for stroke prevention.  Hx of stroke  2008 with LLE residue - not able to run  CT showed right MCA encephalomalacia  Unknown etiology  Hypertension  Home meds: losartan  Permissive hypertension (OK if <220/120) for 24-48 hours post stroke and then gradually normalized within 5-7 days.  Currently on no AEDs  Stable  Patient counseled to be compliant with his blood pressure medications  Hyperlipidemia  Home meds: zocor 20   Currently on zocor 20  LDL 60, goal < 70  Continue statin at discharge  Other Stroke Risk Factors   Advanced age  Hx stroke    DISCHARGE EXAM Blood pressure 134/75, pulse 57, temperature 98.7 F (37.1 C), temperature source Oral, resp. rate 22, height 5\' 9"  (1.753 m), weight 175 lb (79.379 kg), SpO2 96 %.  General -  Well nourished, well developed, in no apparent distress.  Ophthalmologic - Sharp disc margins OU.  Cardiovascular - Regular rate and rhythm with no murmur.  Neck - supple, no carotid  bruits  Mental Status -  Level of arousal and orientation to time, place, and person were intact. Language including expression, naming, repetition, comprehension was assessed and found intact. Fund of Knowledge was assessed and was intact.  Cranial Nerves II - XII - II - Visual field intact OU. III, IV, VI - Extraocular movements intact. V - Facial sensation intact bilaterally. VII - Facial movement intact bilaterally. VIII - Hearing & vestibular intact bilaterally. X - Palate elevates symmetrically. XI - Chin turning & shoulder shrug intact bilaterally. XII - Tongue protrusion intact.  Motor Strength - The patient's strength was normal in all extremities except left hand mild dexterity difficulty and pronator drift was absent. Bulk was normal and fasciculations were absent.  Motor Tone - Muscle tone was assessed at the neck and appendages and was normal.  Reflexes - The patient's reflexes were symmetrical in all extremities and he had no pathological reflexes.  Sensory - Light touch, temperature/pinprick were assessed and were symmetrical.   Coordination - The patient had normal movements in the hands and feet with no ataxia or dysmetria. Tremor was absent.  Gait and Station - deferred due to tPA protocol.   Discharge Diet   Diet - low sodium heart healthy  liquids  DISCHARGE PLAN  Disposition:  Home with outpt OT   xarelto ( rivaroxaban) for secondary stroke prevention.  Follow-up Penni Homans, MD in 2 weeks.  Follow-up with Dr. Rosalin Hawking in 2 months.  40 minutes were spent preparing discharge.  Rosalin Hawking, MD PhD Stroke Neurology 09/22/2014 10:38 PM

## 2014-09-23 ENCOUNTER — Telehealth: Payer: Self-pay | Admitting: Neurology

## 2014-09-23 ENCOUNTER — Encounter (HOSPITAL_COMMUNITY): Payer: Self-pay | Admitting: Cardiovascular Disease

## 2014-09-23 LAB — PROTEIN S, TOTAL: PROTEIN S AG TOTAL: 112 % (ref 60–150)

## 2014-09-23 LAB — PROTEIN C, TOTAL: Protein C, Total: 108 % (ref 72–160)

## 2014-09-23 NOTE — Progress Notes (Signed)
LATE ENTRY--1530pm yesterday-->Pt with orders for Xarelto coupon and outpt OT for discharge. Pt has Medicare Part D so I had no eligible coupons for him as the 30-day free and 61-month reduced copay coupons both specifically listed Medicare Part D recipients as exclusions to the program.  Pt wished to get outpt OT at the [place] location.  Pt provided an order from MD, his OT eval from the hospitalization and the list of [place] therapy locations for his discharge. All of above explained to him personally and he stated understanding. No IM given as he was less than 3-day stay.   Sandi Mariscal, RN BSN MHA CCM  Case Manager, Trauma Service/Unit 21M [phone]

## 2014-09-23 NOTE — Telephone Encounter (Signed)
Patient stated prior authorization is needed for Rx Rivaroxaban (XARELTO STARTER PACK) 15 & 20 MG TBPK.  Questioning is it ok to be off medication until this matter is resolved.  Please call and advise.

## 2014-09-23 NOTE — Telephone Encounter (Signed)
I have contacted ins and provided all clinical info.  Request is under review.  (This is generally taken care of in the hospital prior to discharge).   A voucher may be obtained online for 1 free month trail of meds at [email or web address] while this is pending.  I called back.  Spoke with Ms Carlota Raspberry.  She will call the patient back and relay info regarding trial card.  They are aware we have contacted ins regarding PA.  She will call us back if anything further is needed.

## 2014-09-23 NOTE — Telephone Encounter (Signed)
Nathen May, RN with [facility] @ [phone], questioning if patient could get samples of Rivaroxaban until authorization is taken care of.  Please call and advise.

## 2014-09-24 ENCOUNTER — Telehealth: Payer: Self-pay

## 2014-09-24 ENCOUNTER — Telehealth: Payer: Self-pay | Admitting: Neurology

## 2014-09-24 DIAGNOSIS — I639 Cerebral infarction, unspecified: Secondary | ICD-10-CM

## 2014-09-24 LAB — LUPUS ANTICOAGULANT PANEL
DRVVT: 34.4 secs (ref <42.9)
Lupus Anticoagulant: NOT DETECTED
PTT LA: 42.6 s (ref 28.0–43.0)

## 2014-09-24 LAB — CARDIOLIPIN ANTIBODIES, IGG, IGM, IGA
ANTICARDIOLIPIN IGA: 7 U/mL — AB (ref <22)
ANTICARDIOLIPIN IGM: 7 [MPL'U]/mL — AB (ref <11)
Anticardiolipin IgG: 9 GPL U/mL — ABNORMAL LOW (ref <23)

## 2014-09-24 LAB — BETA-2-GLYCOPROTEIN I ABS, IGG/M/A
Beta-2 Glyco I IgG: 6 G Units (ref <20)
Beta-2-Glycoprotein I IgA: 5 A Units (ref <20)
Beta-2-Glycoprotein I IgM: 16 M Units (ref <20)

## 2014-09-24 NOTE — Telephone Encounter (Signed)
Date of Admission: 09/20/2014 Date of Discharge: 09/22/2014  Reason for admission:  Acute stroke  Transition Care Management Follow-up Telephone Call  How have you been since you were released from the hospital?  Left hand weakness and mild numbness and weakness of left leg (residual from previous stroke).   Otherwise patient is doing well.  Eating and swallowing without  difficulty.  Able to care for self, though it takes a little longer due to left hand weakness.  He plans to see a hand therapist on [address] for rehab.     Do you understand why you were in the hospital? yes   Do you understand the discharge instructions? yes  Items Reviewed:  Medications reviewed: yes  Allergies reviewed: yes  Dietary changes reviewed: yes, low sodium heart healthy  Referrals reviewed: yes, neurology (has an appointment scheduled on 11/24/14 with Dr. Erlinda Hong) and outpatient OT (which he plans to schedule an appointment).     Functional Questionnaire:   Activities of Daily Living (ADLs):   He states they are independent in the following: ambulation, bathing and hygiene, feeding, continence, grooming, toileting and dressing States they require assistance with the following: none   Any transportation issues/concerns?: no   Any patient concerns? no   Confirmed importance and date/time of follow-up visits scheduled: yes   Confirmed with patient if condition begins to worsen call PCP or go to the ER: yes   Hospital follow up appointment scheduled for 09/29/14 @ 1:30 pm with Elyn Aquas, PA-C.

## 2014-09-24 NOTE — Telephone Encounter (Signed)
Neuro rehab requesting PT order, please advise

## 2014-09-24 NOTE — Telephone Encounter (Signed)
[employer] has approved our request for coverage on [employer] Ref # [id number] and Xarelto 54m Ref # [id number] until 09/23/2015

## 2014-09-24 NOTE — Telephone Encounter (Signed)
Patient stated Neuro Rehab needs order for PT ordered by Dr. Erlinda Hong.  Please call and advise.

## 2014-09-24 NOTE — Telephone Encounter (Signed)
Pt was just d/c from hospital where PT said no need PT follow up. But whatever, I have just ordered PT and OT for  Him as out pt. Could you please let pt know? Thanks.  Rosalin Hawking, MD PhD Stroke Neurology 09/24/2014 5:21 PM

## 2014-09-26 ENCOUNTER — Telehealth: Payer: Self-pay | Admitting: Neurology

## 2014-09-26 LAB — PROTEIN S ACTIVITY: Protein S Activity: 112 % (ref 60–145)

## 2014-09-26 LAB — PROTEIN C ACTIVITY: PROTEIN C ACTIVITY: 119 % (ref 74–151)

## 2014-09-26 NOTE — Telephone Encounter (Signed)
I called the patient. He recently was placed on Xarelto, and he is having some blood tinged urine. He has had some back pain. He has a history of renal calculi that has been active over several years. He is not to stop the Xarelto, as he has a doccumented DVT and PFO with a stroke. If the patient developes clots in the urine or has outflow obstruction, he is to go to the ER. If the blood tinged urine continues, he is to contact his urologist.

## 2014-09-27 LAB — PROTHROMBIN GENE MUTATION

## 2014-09-27 LAB — FACTOR 5 LEIDEN

## 2014-09-27 NOTE — Telephone Encounter (Signed)
Spoke with patient and informed him that PT has been ordered for him and he will be contacted by them for scheduling.

## 2014-09-29 ENCOUNTER — Encounter: Payer: Self-pay | Admitting: Physician Assistant

## 2014-09-29 ENCOUNTER — Ambulatory Visit (INDEPENDENT_AMBULATORY_CARE_PROVIDER_SITE_OTHER): Payer: Medicare Other | Admitting: Physician Assistant

## 2014-09-29 VITALS — BP 121/71 | HR 77 | Temp 98.2°F | Resp 16 | Ht 68.0 in | Wt 180.5 lb

## 2014-09-29 DIAGNOSIS — I639 Cerebral infarction, unspecified: Secondary | ICD-10-CM

## 2014-09-29 DIAGNOSIS — Q211 Atrial septal defect: Secondary | ICD-10-CM | POA: Diagnosis not present

## 2014-09-29 DIAGNOSIS — I82401 Acute embolism and thrombosis of unspecified deep veins of right lower extremity: Secondary | ICD-10-CM

## 2014-09-29 DIAGNOSIS — Q2112 Patent foramen ovale: Secondary | ICD-10-CM

## 2014-09-29 NOTE — Progress Notes (Signed)
Pre visit review using our clinic review tool, if applicable. No additional management support is needed unless otherwise documented below in the visit note/SLS  

## 2014-09-29 NOTE — Patient Instructions (Signed)
Please continue your current medication regimen, especially the BP medications and Xarelto.  Follow-up with Urology tomorrow as scheduled.  Do not stop the Xarelto. It is very important you attend the therapy scheduled for you to help you regain your strength back.  Follow-up with myself or Dr. Charlett Blake in 1 month.  Deep Vein Thrombosis A deep vein thrombosis (DVT) is a blood clot that develops in the deep, larger veins of the leg, arm, or pelvis. These are more dangerous than clots that might form in veins near the surface of the body. A DVT can lead to serious and even life-threatening complications if the clot breaks off and travels in the bloodstream to the lungs.  A DVT can damage the valves in your leg veins so that instead of flowing upward, the blood pools in the lower leg. This is called post-thrombotic syndrome, and it can result in pain, swelling, discoloration, and sores on the leg. CAUSES Usually, several things contribute to the formation of blood clots. Contributing factors include:  The flow of blood slows down.  The inside of the vein is damaged in some way.  You have a condition that makes blood clot more easily. RISK FACTORS Some people are more likely than others to develop blood clots. Risk factors include:   Smoking.  Being overweight (obese).  Sitting or lying still for a long time. This includes long-distance travel, paralysis, or recovery from an illness or surgery. Other factors that increase risk are:   Older age, especially over 86 years of age.  Having a family history of blood clots or if you have already had a blot clot.  Having major or lengthy surgery. This is especially true for surgery on the hip, knee, or belly (abdomen). Hip surgery is particularly high risk.  Having a long, thin tube (catheter) placed inside a vein during a medical procedure.  Breaking a hip or leg.  Having cancer or cancer treatment.  Pregnancy and childbirth.  Hormone  changes make the blood clot more easily during pregnancy.  The fetus puts pressure on the veins of the pelvis.  There is a risk of injury to veins during delivery or a caesarean delivery. The risk is highest just after childbirth.  Medicines containing the male hormone estrogen. This includes birth control pills and hormone replacement therapy.  Other circulation or heart problems.  SIGNS AND SYMPTOMS When a clot forms, it can either partially or totally block the blood flow in that vein. Symptoms of a DVT can include:  Swelling of the leg or arm, especially if one side is much worse.  Warmth and redness of the leg or arm, especially if one side is much worse.  Pain in an arm or leg. If the clot is in the leg, symptoms may be more noticeable or worse when standing or walking. The symptoms of a DVT that has traveled to the lungs (pulmonary embolism, PE) usually start suddenly and include:  Shortness of breath.  Coughing.  Coughing up blood or blood-tinged mucus.  Chest pain. The chest pain is often worse with deep breaths.  Rapid heartbeat. Anyone with these symptoms should get emergency medical treatment right away. Do not wait to see if the symptoms will go away. Call your local emergency services (911 in the [place] if you have these symptoms. Do not drive yourself to the hospital. DIAGNOSIS If a DVT is suspected, your health care provider will take a full medical history and perform a physical exam. Tests that also  may be required include:  Blood tests, including studies of the clotting properties of the blood.  Ultrasound to see if you have clots in your legs or lungs.  X-rays to show the flow of blood when dye is injected into the veins (venogram).  Studies of your lungs if you have any chest symptoms. PREVENTION  Exercise the legs regularly. Take a brisk 30-minute walk every day.  Maintain a weight that is appropriate for your height.  Avoid sitting or lying in bed  for long periods of time without moving your legs.  Women, particularly those over the age of 26 years, should consider the risks and benefits of taking estrogen medicines, including birth control pills.  Do not smoke, especially if you take estrogen medicines.  Long-distance travel can increase your risk of DVT. You should exercise your legs by walking or pumping the muscles every hour.  Many of the risk factors above relate to situations that exist with hospitalization, either for illness, injury, or elective surgery. Prevention may include medical and nonmedical measures.  Your health care provider will assess you for the need for venous thromboembolism prevention when you are admitted to the hospital. If you are having surgery, your surgeon will assess you the day of or day after surgery. TREATMENT Once identified, a DVT can be treated. It can also be prevented in some circumstances. Once you have had a DVT, you may be at increased risk for a DVT in the future. The most common treatment for DVT is blood-thinning (anticoagulant) medicine, which reduces the blood's tendency to clot. Anticoagulants can stop new blood clots from forming and stop old clots from growing. They cannot dissolve existing clots. Your body does this by itself over time. Anticoagulants can be given by mouth, through an IV tube, or by injection. Your health care provider will determine the best program for you. Other medicines or treatments that may be used are:  Heparin or related medicines (low molecular weight heparin) are often the first treatment for a blood clot. They act quickly. However, they cannot be taken orally and must be given either in shot form or by IV tube.  Heparin can cause a fall in a component of blood that stops bleeding and forms blood clots (platelets). You will be monitored with blood tests to be sure this does not occur.  Warfarin is an anticoagulant that can be swallowed. It takes a few days to  start working, so usually heparin or related medicines are used in combination. Once warfarin is working, heparin is usually stopped.  Factor Xa inhibitor medicines, such as rivaroxaban and apixaban, also reduce blood clotting. These medicines are taken orally and can often be used without heparin or related medicines.  Less commonly, clot dissolving drugs (thrombolytics) are used to dissolve a DVT. They carry a high risk of bleeding, so they are used mainly in severe cases where your life or a part of your body is threatened.  Very rarely, a blood clot in the leg needs to be removed surgically.  If you are unable to take anticoagulants, your health care provider may arrange for you to have a filter placed in a main vein in your abdomen. This filter prevents clots from traveling to your lungs. HOME CARE INSTRUCTIONS  Take all medicines as directed by your health care provider.  Learn as much as you can about DVT.  Wear a medical alert bracelet or carry a medical alert card.  Ask your health care provider how soon  you can go back to normal activities. It is important to stay active to prevent blood clots. If you are on anticoagulant medicine, avoid contact sports.  It is very important to exercise. This is especially important while traveling, sitting, or standing for long periods of time. Exercise your legs by walking or by tightening and relaxing your leg muscles regularly. Take frequent walks.  You may need to wear compression stockings. These are tight elastic stockings that apply pressure to the lower legs. This pressure can help keep the blood in the legs from clotting. Taking Warfarin Warfarin is a daily medicine that is taken by mouth. Your health care provider will advise you on the length of treatment (usually 3-6 months, sometimes lifelong). If you take warfarin:  Understand how to take warfarin and foods that can affect how warfarin works in Veterinary surgeon.  Too much and too little  warfarin are both dangerous. Too much warfarin increases the risk of bleeding. Too little warfarin continues to allow the risk for blood clots. Warfarin and Regular Blood Testing While taking warfarin, you will need to have regular blood tests to measure your blood clotting time. These blood tests usually include both the prothrombin time (PT) and international normalized ratio (INR) tests. The PT and INR results allow your health care provider to adjust your dose of warfarin. It is very important that you have your PT and INR tested as often as directed by your health care provider.  Warfarin and Your Diet Avoid major changes in your diet, or notify your health care provider before changing your diet. Arrange a visit with a registered dietitian to answer your questions. Many foods, especially foods high in vitamin K, can interfere with warfarin and affect the PT and INR results. You should eat a consistent amount of foods high in vitamin K. Foods high in vitamin K include:   Spinach, kale, broccoli, cabbage, collard and turnip greens, Brussels sprouts, peas, cauliflower, seaweed, and parsley.  Beef and pork liver.  Green tea.  Soybean oil. Warfarin with Other Medicines Many medicines can interfere with warfarin and affect the PT and INR results. You must:  Tell your health care provider about any and all medicines, vitamins, and supplements you take, including aspirin and other over-the-counter anti-inflammatory medicines. Be especially cautious with aspirin and anti-inflammatory medicines. Ask your health care provider before taking these.  Do not take or discontinue any prescribed or over-the-counter medicine except on the advice of your health care provider or pharmacist. Warfarin Side Effects Warfarin can have side effects, such as easy bruising and difficulty stopping bleeding. Ask your health care provider or pharmacist about other side effects of warfarin. You will need to:  Hold  pressure over cuts for longer than usual.  Notify your dentist and other health care providers that you are taking warfarin before you undergo any procedures where bleeding may occur. Warfarin with Alcohol and Tobacco   Drinking alcohol frequently can increase the effect of warfarin, leading to excess bleeding. It is best to avoid alcoholic drinks or to consume only very small amounts while taking warfarin. Notify your health care provider if you change your alcohol intake.   Do not use any tobacco products including cigarettes, chewing tobacco, or electronic cigarettes. If you smoke, quit. Ask your health care provider for help with quitting smoking. Alternative Medicines to Warfarin: Factor Xa Inhibitor Medicines  These blood-thinning medicines are taken by mouth, usually for several weeks or longer. It is important to take the medicine  every single day at the same time each day.  There are no regular blood tests required when using these medicines.  There are fewer food and drug interactions than with warfarin.  The side effects of this class of medicine are similar to those of warfarin, including excessive bruising or bleeding. Ask your health care provider or pharmacist about other potential side effects. SEEK MEDICAL CARE IF:  You notice a rapid heartbeat.  You feel weaker or more tired than usual.  You feel faint.  You notice increased bruising.  You feel your symptoms are not getting better in the time expected.  You believe you are having side effects of medicine. SEEK IMMEDIATE MEDICAL CARE IF:  You have chest pain.  You have trouble breathing.  You have new or increased swelling or pain in one leg.  You cough up blood.  You notice blood in vomit, in a bowel movement, or in urine. MAKE SURE YOU:  Understand these instructions.  Will watch your condition.  Will get help right away if you are not doing well or get worse. Document Released: 09/03/2005 Document  Revised: 01/18/2014 Document Reviewed: 05/11/2013 [facility] Patient Information 2015 [place], [place]. This information is not intended to replace advice given to you by your health care provider. Make sure you discuss any questions you have with your health care provider.  Stroke Prevention Some medical conditions and behaviors are associated with an increased chance of having a stroke. You may prevent a stroke by making healthy choices and managing medical conditions. HOW CAN I REDUCE MY RISK OF HAVING A STROKE?   Stay physically active. Get at least 30 minutes of activity on most or all days.  Do not smoke. It may also be helpful to avoid exposure to secondhand smoke.  Limit alcohol use. Moderate alcohol use is considered to be:  No more than 2 drinks per day for men.  No more than 1 drink per day for nonpregnant women.  Eat healthy foods. This involves:  Eating 5 or more servings of fruits and vegetables a day.  Making dietary changes that address high blood pressure (hypertension), high cholesterol, diabetes, or obesity.  Manage your cholesterol levels.  Making food choices that are high in fiber and low in saturated fat, trans fat, and cholesterol may control cholesterol levels.  Take any prescribed medicines to control cholesterol as directed by your health care provider.  Manage your diabetes.  Controlling your carbohydrate and sugar intake is recommended to manage diabetes.  Take any prescribed medicines to control diabetes as directed by your health care provider.  Control your hypertension.  Making food choices that are low in salt (sodium), saturated fat, trans fat, and cholesterol is recommended to manage hypertension.  Take any prescribed medicines to control hypertension as directed by your health care provider.  Maintain a healthy weight.  Reducing calorie intake and making food choices that are low in sodium, saturated fat, trans fat, and cholesterol are  recommended to manage weight.  Stop drug abuse.  Avoid taking birth control pills.  Talk to your health care provider about the risks of taking birth control pills if you are over 35 years old, smoke, get migraines, or have ever had a blood clot.  Get evaluated for sleep disorders (sleep apnea).  Talk to your health care provider about getting a sleep evaluation if you snore a lot or have excessive sleepiness.  Take medicines only as directed by your health care provider.  For some people, aspirin  or blood thinners (anticoagulants) are helpful in reducing the risk of forming abnormal blood clots that can lead to stroke. If you have the irregular heart rhythm of atrial fibrillation, you should be on a blood thinner unless there is a good reason you cannot take them.  Understand all your medicine instructions.  Make sure that other conditions (such as anemia or atherosclerosis) are addressed. SEEK IMMEDIATE MEDICAL CARE IF:   You have sudden weakness or numbness of the face, arm, or leg, especially on one side of the body.  Your face or eyelid droops to one side.  You have sudden confusion.  You have trouble speaking (aphasia) or understanding.  You have sudden trouble seeing in one or both eyes.  You have sudden trouble walking.  You have dizziness.  You have a loss of balance or coordination.  You have a sudden, severe headache with no known cause.  You have new chest pain or an irregular heartbeat. Any of these symptoms may represent a serious problem that is an emergency. Do not wait to see if the symptoms will go away. Get medical help at once. Call your local emergency services (911 in [place] Do not drive yourself to the hospital. Document Released: 10/11/2004 Document Revised: 01/18/2014 Document Reviewed: 03/06/2013 [facility] Patient Information 2015 [place], [place]. This information is not intended to replace advice given to you by your health care provider. Make sure  you discuss any questions you have with your health care provider.

## 2014-09-30 DIAGNOSIS — R109 Unspecified abdominal pain: Secondary | ICD-10-CM | POA: Diagnosis not present

## 2014-09-30 DIAGNOSIS — N200 Calculus of kidney: Secondary | ICD-10-CM | POA: Diagnosis not present

## 2014-09-30 DIAGNOSIS — R310 Gross hematuria: Secondary | ICD-10-CM | POA: Diagnosis not present

## 2014-09-30 DIAGNOSIS — N401 Enlarged prostate with lower urinary tract symptoms: Secondary | ICD-10-CM | POA: Diagnosis not present

## 2014-10-04 DIAGNOSIS — I82409 Acute embolism and thrombosis of unspecified deep veins of unspecified lower extremity: Secondary | ICD-10-CM | POA: Insufficient documentation

## 2014-10-04 NOTE — Assessment & Plan Note (Signed)
No swelling noted on exam.  Negative tenderness.  Denies chest pain, SOB. Continue Xarelto 15 mg BID until course is complete.  Then transition to 20 mg daily.  Follow-up with PCP in 1 month.

## 2014-10-04 NOTE — Progress Notes (Signed)
Patient presents to clinic today for hospital follow-up.  Patient admitted to hospital on 09/20/14 for stroke workup after presenting to ER with left sided weakness and expressive aphasia. Was diagnosed with CVA and DVT.  MRI revealed R MCA infarction.  Echocardiogram revealed PFO. Doppler US shows DVT involving the R gastrocnemius, posterior tibial and peroneal veins.  Patient stabilized in hospital and placed on Xarelto 15 mg BID x 21 days before starting 20 mg daily dosing.  Patient was discharged with instruction to follow-up with PCP and Neuro-rehabilitation.  Since discharge, patient states he has been doing very well overall.  Denies any residual expressive or receptive aphasia. Still having some left sided weakness, most prominent in his left hand.  Endorses difficulty with fine movements.  States he does see progress every day.  Endorses taking his medications as directed. BP 121/71 today in clinic.  Endorses some mild hematuria for which he called his Web designer.  Neurology instructed patient to stay on Xarelto.  Urology has set patient up for appointment later today.  Past Medical History  Diagnosis Date  . Kidney stones   . CVA (cerebral infarction)   . Depression   . Hyperlipemia   . Hypertension   . Chicken pox   . Seasonal allergies     some asthma  . Measles as a child  . Mumps as a child  . Allergic state 12/29/2013  . Unspecified asthma(493.90) 12/29/2013  . GERD (gastroesophageal reflux disease)   . Arthritis   . IBS (irritable bowel syndrome)   . Otitis externa 12/10/2013    Current Outpatient Prescriptions on File Prior to Visit  Medication Sig Dispense Refill  . albuterol (PROVENTIL HFA;VENTOLIN HFA) 108 (90 BASE) MCG/ACT inhaler Inhale 2 puffs into the lungs every 6 (six) hours as needed for wheezing or shortness of breath. 1 Inhaler 3  . Cetirizine HCl (ZYRTEC ALLERGY PO) Take 10 mg by mouth daily.     . citalopram (CELEXA) 20 MG tablet Take 20 mg by  mouth daily.   2  . fluticasone (FLONASE) 50 MCG/ACT nasal spray Place 1 spray into both nostrils daily.     Marland Kitchen losartan (COZAAR) 100 MG tablet Take 100 mg by mouth daily.    . Multiple Vitamins-Minerals (MULTIVITAMINS THER. W/MINERALS) TABS tablet Take 1 tablet by mouth daily.    . Omega-3 Fatty Acids (OMEGA-3 FISH OIL) 1200 MG CAPS Take 1,200 mg by mouth daily.    Marland Kitchen omeprazole (PRILOSEC) 40 MG capsule Take 1 capsule (40 mg total) by mouth 2 (two) times daily. 180 capsule 3  . Potassium Citrate 15 MEQ (1620 MG) TBCR Take 1 tablet by mouth 2 (two) times daily.    Marland Kitchen RAPAFLO 8 MG CAPS capsule Take 8 mg by mouth daily with breakfast.   11  . Rivaroxaban (XARELTO STARTER PACK) 15 & 20 MG TBPK Take as directed on package: Start with one 41m tablet by mouth twice a day with food. On Day 22, switch to one 237mtablet once a day. 51 each 0  . rivaroxaban (XARELTO) 20 MG TABS tablet Take one 2065mablets once a day starting from 10/23/14 for 5 months. 30 tablet 4  . simvastatin (ZOCOR) 20 MG tablet Take 20 mg by mouth daily.      No current facility-administered medications on file prior to visit.    No Known Allergies  Family History  Problem Relation Age of Onset  . Hypertension Mother   . Hyperlipidemia Mother   .  Fibromyalgia Mother   . Arthritis Mother     rheumatoid  . Diabetes Sister     type 2  . Hyperlipidemia Brother   . Hypertension Brother   . Ulcers Father 36    Bleeding Ulcers  . Cancer Maternal Uncle     prostate  . Kidney Stones Daughter   . Asthma Daughter   . Healthy Son   . Cancer Maternal Grandfather     skin ?    History   Social History  . Marital Status: Married    Spouse Name: N/A    Number of Children: N/A  . Years of Education: N/A   Social History Main Topics  . Smoking status: Never Smoker   . Smokeless tobacco: Never Used  . Alcohol Use: 0.0 oz/week    0 Not specified per week     Comment: 1-2 occasioinal  . Drug Use: No  . 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 None   Social History Narrative   Review of Systems - See HPI.  All other ROS are negative.  BP 121/71 mmHg  Pulse 77  Temp(Src) 98.2 F (36.8 C) (Oral)  Resp 16  Ht 5' 8"  (1.727 m)  Wt 180 lb 8 oz (81.874 kg)  BMI 27.45 kg/m2  SpO2 100%  Physical Exam  Constitutional: He is oriented to person, place, and time and well-developed, well-nourished, and in no distress.  HENT:  Head: Normocephalic and atraumatic.  Eyes: Conjunctivae are normal. Pupils are equal, round, and reactive to light.  Neck: Neck supple.  Cardiovascular: Normal rate, regular rhythm, normal heart sounds and intact distal pulses.   Pulmonary/Chest: Effort normal and breath sounds normal. No respiratory distress. He has no wheezes. He has no rales. He exhibits no tenderness.  Neurological: He is alert and oriented to person, place, and time. He has normal sensation, normal strength and normal reflexes. He displays no weakness, facial symmetry and normal stance. No cranial nerve deficit or sensory deficit. He exhibits normal muscle tone. Gait normal. Coordination and gait normal. GCS score is 15.  Skin: Skin is warm and dry. No rash noted. He is not diaphoretic.  Psychiatric: Affect normal.  Vitals reviewed.  Recent Results (from the past 2160 hour(s))  POCT Urinalysis Dipstick     Status: Abnormal   Collection Time: 07/06/14 10:29 AM  Result Value Ref Range   Color, UA Yellow    Clarity, UA Clear    Glucose, UA Neg    Bilirubin, UA Neg    Ketones, UA Neg    Spec Grav, UA 1.010    Blood, UA Neg    pH, UA 7.5    Protein, UA Trace    Urobilinogen, UA 0.2    Nitrite, UA Neg    Leukocytes, UA Trace   Lipase     Status: None   Collection Time: 07/06/14 10:42 AM  Result Value Ref Range   Lipase 26.0 11.0 - 59.0 U/L  Comp Met (CMET)     Status: Abnormal   Collection Time: 07/06/14 10:42 AM  Result  Value Ref Range   Sodium 142 135 - 145 mEq/L   Potassium 4.4 3.5 - 5.1 mEq/L   Chloride 105 96 - 112 mEq/L   CO2 28 19 - 32 mEq/L   Glucose, Bld 79 70 - 99 mg/dL   BUN 13 6 - 23 mg/dL  Creatinine, Ser 1.3 0.4 - 1.5 mg/dL   Total Bilirubin 0.7 0.2 - 1.2 mg/dL   Alkaline Phosphatase 64 39 - 117 U/L   AST 26 0 - 37 U/L   ALT 16 0 - 53 U/L   Total Protein 6.9 6.0 - 8.3 g/dL   Albumin 3.4 (L) 3.5 - 5.2 g/dL   Calcium 9.1 8.4 - 10.5 mg/dL   GFR 59.04 (L) >60.00 mL/min  CBC w/Diff     Status: Abnormal   Collection Time: 07/06/14 10:42 AM  Result Value Ref Range   WBC 5.4 4.0 - 10.5 K/uL   RBC 4.17 (L) 4.22 - 5.81 Mil/uL   Hemoglobin 13.3 13.0 - 17.0 g/dL   HCT 41.0 39.0 - 52.0 %   MCV 98.3 78.0 - 100.0 fl   MCHC 32.4 30.0 - 36.0 g/dL   RDW 12.6 11.5 - 15.5 %   Platelets 160.0 150.0 - 400.0 K/uL   Neutrophils Relative % 62.4 43.0 - 77.0 %   Lymphocytes Relative 22.0 12.0 - 46.0 %   Monocytes Relative 10.0 3.0 - 12.0 %   Eosinophils Relative 4.5 0.0 - 5.0 %   Basophils Relative 1.1 0.0 - 3.0 %   Neutro Abs 3.4 1.4 - 7.7 K/uL   Lymphs Abs 1.2 0.7 - 4.0 K/uL   Monocytes Absolute 0.5 0.1 - 1.0 K/uL   Eosinophils Absolute 0.2 0.0 - 0.7 K/uL   Basophils Absolute 0.1 0.0 - 0.1 K/uL  H. pylori antibody, IgG     Status: None   Collection Time: 07/06/14 10:42 AM  Result Value Ref Range   H Pylori IgG Negative Negative  IgA     Status: None   Collection Time: 07/14/14  9:32 AM  Result Value Ref Range   IgA 236 68 - 378 mg/dL    Comment:    TSH     Status: None   Collection Time: 07/14/14  9:32 AM  Result Value Ref Range   TSH 2.45 0.35 - 4.50 uIU/mL  Tissue transglutaminase, IgA     Status: None   Collection Time: 07/14/14  9:32 AM  Result Value Ref Range   Tissue Transglutaminase Ab, IgA 5.8 <20 U/mL    Comment:    Reference Range:         <20     Negative         20-25   Equivocal         >25     Positive   Between 2-3% of Celiac patients have selective IgA deficiency. If  the tTG IgA result is negative but Celiac disease is still suspected, total IgA should be measured to identify possible selective IgA deficiency and to rule out a false negative. In cases of IgA deficiency, measurement of tTG IgG should be considered.    Lipid Profile     Status: None   Collection Time: 08/02/14  2:00 PM  Result Value Ref Range   Cholesterol 151 0 - 200 mg/dL    Comment: ATP III Classification       Desirable:  < 200 mg/dL               Borderline High:  200 - 239 mg/dL          High:  > = 240 mg/dL   Triglycerides 76.0 0.0 - 149.0 mg/dL    Comment: Normal:  <150 mg/dLBorderline High:  150 - 199 mg/dL   HDL 55.10 >39.00 mg/dL   VLDL 15.2 0.0 -  40.0 mg/dL   LDL Cholesterol 81 0 - 99 mg/dL   Total CHOL/HDL Ratio 3     Comment:                Men          Women1/2 Average Risk     3.4          3.3Average Risk          5.0          4.42X Average Risk          9.6          7.13X Average Risk          15.0          11.0                       NonHDL 95.90     Comment: NOTE:  Non-HDL goal should be 30 mg/dL higher than patient's LDL goal (i.e. LDL goal of < 70 mg/dL, would have non-HDL goal of < 100 mg/dL)  TSH     Status: None   Collection Time: 08/02/14  2:00 PM  Result Value Ref Range   TSH 1.93 0.35 - 4.50 uIU/mL  Renal function panel     Status: None   Collection Time: 08/02/14  2:00 PM  Result Value Ref Range   Sodium 141 135 - 145 mEq/L   Potassium 4.5 3.5 - 5.1 mEq/L   Chloride 108 96 - 112 mEq/L   CO2 24 19 - 32 mEq/L   Calcium 8.9 8.4 - 10.5 mg/dL   Albumin 4.0 3.5 - 5.2 g/dL   BUN 16 6 - 23 mg/dL   Creatinine, Ser 1.2 0.4 - 1.5 mg/dL   Glucose, Bld 90 70 - 99 mg/dL   Phosphorus 3.5 2.3 - 4.6 mg/dL   GFR 63.59 >60.00 mL/min  Hepatic function panel     Status: None   Collection Time: 08/02/14  2:00 PM  Result Value Ref Range   Total Bilirubin 0.6 0.2 - 1.2 mg/dL   Bilirubin, Direct 0.1 0.0 - 0.3 mg/dL   Alkaline Phosphatase 66 39 - 117 U/L   AST 24 0 - 37  U/L   ALT 19 0 - 53 U/L   Total Protein 6.7 6.0 - 8.3 g/dL   Albumin 4.0 3.5 - 5.2 g/dL  CBC     Status: Abnormal   Collection Time: 08/02/14  2:00 PM  Result Value Ref Range   WBC 6.3 4.0 - 10.5 K/uL   RBC 4.11 (L) 4.22 - 5.81 Mil/uL   Platelets 169.0 150.0 - 400.0 K/uL   Hemoglobin 13.1 13.0 - 17.0 g/dL   HCT 39.7 39.0 - 52.0 %   MCV 96.7 78.0 - 100.0 fl   MCHC 32.9 30.0 - 36.0 g/dL   RDW 12.1 11.5 - 15.5 %  Protime-INR     Status: None   Collection Time: 09/20/14  9:18 PM  Result Value Ref Range   Prothrombin Time 13.0 11.6 - 15.2 seconds   INR 0.98 0.00 - 1.49  APTT     Status: None   Collection Time: 09/20/14  9:18 PM  Result Value Ref Range   aPTT 35 24 - 37 seconds  CBC     Status: None   Collection Time: 09/20/14  9:18 PM  Result Value Ref Range   WBC 7.1 4.0 - 10.5 K/uL   RBC 4.27 4.22 - 5.81 MIL/uL  Hemoglobin 13.6 13.0 - 17.0 g/dL   HCT 41.6 39.0 - 52.0 %   MCV 97.4 78.0 - 100.0 fL   MCH 31.9 26.0 - 34.0 pg   MCHC 32.7 30.0 - 36.0 g/dL   RDW 12.3 11.5 - 15.5 %   Platelets 153 150 - 400 K/uL  Differential     Status: None   Collection Time: 09/20/14  9:18 PM  Result Value Ref Range   Neutrophils Relative % 53 43 - 77 %   Neutro Abs 3.8 1.7 - 7.7 K/uL   Lymphocytes Relative 32 12 - 46 %   Lymphs Abs 2.2 0.7 - 4.0 K/uL   Monocytes Relative 9 3 - 12 %   Monocytes Absolute 0.7 0.1 - 1.0 K/uL   Eosinophils Relative 5 0 - 5 %   Eosinophils Absolute 0.3 0.0 - 0.7 K/uL   Basophils Relative 1 0 - 1 %   Basophils Absolute 0.1 0.0 - 0.1 K/uL  Comprehensive metabolic panel     Status: Abnormal   Collection Time: 09/20/14  9:18 PM  Result Value Ref Range   Sodium 141 135 - 145 mmol/L    Comment: Please note change in reference range.   Potassium 3.4 (L) 3.5 - 5.1 mmol/L    Comment: Please note change in reference range.   Chloride 106 96 - 112 mEq/L   CO2 27 19 - 32 mmol/L   Glucose, Bld 101 (H) 70 - 99 mg/dL   BUN 16 6 - 23 mg/dL   Creatinine, Ser 1.33 0.50  - 1.35 mg/dL   Calcium 9.4 8.4 - 10.5 mg/dL   Total Protein 7.6 6.0 - 8.3 g/dL   Albumin 4.3 3.5 - 5.2 g/dL   AST 26 0 - 37 U/L   ALT 17 0 - 53 U/L   Alkaline Phosphatase 74 39 - 117 U/L   Total Bilirubin 0.5 0.3 - 1.2 mg/dL   GFR calc non Af Amer 53 (L) >90 mL/min   GFR calc Af Amer 61 (L) >90 mL/min    Comment: (NOTE) The eGFR has been calculated using the CKD EPI equation. This calculation has not been validated in all clinical situations. eGFR's persistently <90 mL/min signify possible Chronic Kidney Disease.    Anion gap 8 5 - 15  CBG monitoring, ED     Status: Abnormal   Collection Time: 09/20/14  9:22 PM  Result Value Ref Range   Glucose-Capillary 106 (H) 70 - 99 mg/dL   Comment 1 Notify RN    Comment 2 Documented in Chart   Ethanol     Status: Abnormal   Collection Time: 09/20/14  9:57 PM  Result Value Ref Range   Alcohol, Ethyl (B) 102 (H) 0 - 9 mg/dL    Comment:        LOWEST DETECTABLE LIMIT FOR SERUM ALCOHOL IS 11 mg/dL FOR MEDICAL PURPOSES ONLY   CBG monitoring, ED     Status: None   Collection Time: 09/20/14 10:10 PM  Result Value Ref Range   Glucose-Capillary 94 70 - 99 mg/dL  I-stat troponin, ED (not at [facility])     Status: None   Collection Time: 09/20/14 10:16 PM  Result Value Ref Range   Troponin i, poc 0.01 0.00 - 0.08 ng/mL   Comment 3            Comment: Due to the release kinetics of cTnI, a negative result within the first hours of the onset of symptoms does not rule  out myocardial infarction with certainty. If myocardial infarction is still suspected, repeat the test at appropriate intervals.   MRSA PCR Screening     Status: None   Collection Time: 09/20/14 11:25 PM  Result Value Ref Range   MRSA by PCR NEGATIVE NEGATIVE    Comment:        The GeneXpert MRSA Assay (FDA approved for NASAL specimens only), is one component of a comprehensive MRSA colonization surveillance program. It is not intended to diagnose MRSA infection nor to guide  or monitor treatment for MRSA infections.   Hemoglobin A1c     Status: Abnormal   Collection Time: 09/21/14  2:32 AM  Result Value Ref Range   Hgb A1c MFr Bld 5.7 (H) <5.7 %    Comment: (NOTE)                                                                       According to the ADA Clinical Practice Recommendations for 2011, when HbA1c is used as a screening test:  >=6.5%   Diagnostic of Diabetes Mellitus           (if abnormal result is confirmed) 5.7-6.4%   Increased risk of developing Diabetes Mellitus References:Diagnosis and Classification of Diabetes Mellitus,Diabetes XAJO,8786,76(HMCNO 1):S62-S69 and Standards of Medical Care in         Diabetes - 2011,Diabetes Care,2011,34 (Suppl 1):S11-S61.    Mean Plasma Glucose 117 (H) <117 mg/dL    Comment: Performed at [facility]  Lipid panel     Status: Abnormal   Collection Time: 09/21/14  2:32 AM  Result Value Ref Range   Cholesterol 154 0 - 200 mg/dL   Triglycerides 226 (H) <150 mg/dL   HDL 49 >39 mg/dL   Total CHOL/HDL Ratio 3.1 RATIO   VLDL 45 (H) 0 - 40 mg/dL   LDL Cholesterol 60 0 - 99 mg/dL    Comment:        Total Cholesterol/HDL:CHD Risk Coronary Heart Disease Risk Table                     Men   Women  1/2 Average Risk   3.4   3.3  Average Risk       5.0   4.4  2 X Average Risk   9.6   7.1  3 X Average Risk  23.4   11.0        Use the calculated Patient Ratio above and the CHD Risk Table to determine the patient's CHD Risk.        ATP III CLASSIFICATION (LDL):  <100     mg/dL   Optimal  100-129  mg/dL   Near or Above                    Optimal  130-159  mg/dL   Borderline  160-189  mg/dL   High  >190     mg/dL   Very High   Antithrombin III     Status: None   Collection Time: 09/22/14  4:10 PM  Result Value Ref Range   AntiThromb III Func 86 75 - 120 %  Protein C activity     Status: None   Collection  Time: 09/22/14  4:10 PM  Result Value Ref Range   Protein C Activity 119 74 - 151 %     Comment: (NOTE) Performed At: [facility] [place], [place] [id number] Lindon Romp MD UY:[id number]   Protein C, total     Status: None   Collection Time: 09/22/14  4:10 PM  Result Value Ref Range   Protein C, Total 108 72 - 160 %    Comment: ** Please note change in reference range(s). ** Performed at [place] S activity     Status: None   Collection Time: 09/22/14  4:10 PM  Result Value Ref Range   Protein S Activity 112 60 - 145 %    Comment: (NOTE) Performed At: [facility] [place], [place] [id number] Lindon Romp MD IR:[id number]   Protein S, total     Status: None   Collection Time: 09/22/14  4:10 PM  Result Value Ref Range   Protein S Ag, Total 112 60 - 150 %    Comment: Performed at [facility]  Lupus anticoagulant panel     Status: None   Collection Time: 09/22/14  4:10 PM  Result Value Ref Range   PTT Lupus Anticoagulant 42.6 28.0 - 43.0 secs   PTTLA Confirmation NOT APPL <8.0 secs   PTTLA 4:1 Mix NOT APPL 28.0 - 43.0 secs   DRVVT 34.4 <42.9 secs   Drvvt confirmation NOT APPL <1.15 Ratio   dRVVT Incubated 1:1 Mix NOT APPL <42.9 secs   Lupus Anticoagulant NOT DETECTED NOT DETECTED    Comment: Performed at [facility]  Beta-2-glycoprotein i abs, IgG/M/A     Status: None   Collection Time: 09/22/14  4:10 PM  Result Value Ref Range   Beta-2 Glyco I IgG 6 <20 G Units   Beta-2-Glycoprotein I IgM 16 <20 M Units   Beta-2-Glycoprotein I IgA 5 <20 A Units    Comment: Performed at [facility]  Factor 5 leiden     Status: None   Collection Time: 09/22/14  4:10 PM  Result Value Ref Range   Interpretation-F5LEID: REPORT     Comment: (NOTE) This individual is negative (normal) for the Factor V Leiden (R506Q) mutation in the Factor V gene. Increased risk of thrombophilia can be caused by a variety of genetic and non-genetic factors not screened for by this assay.      Recommendations-F5LEID: REPORT     Comment: FACTOR V LEIDEN (R506Q) MUTATION NOT DETECTED   Reviewer REPORT     Comment: (NOTE) Forestine Na, Ph.D.,FACMG, Director, Molecular Genetics SUPPLEMENTAL INFORMATION The Factor V Leiden (T016W) mutation [[id number]:c. 1601G>A (p.R534Q)] in the Factor V gene is one of the most common causes of inherited thrombophilia.  This mutation causes resistance to degradation of activated Factor V protein by activated Protein C (APC). The Factor V Leiden (R506Q) mutation is detected by amplification of the selected region of Factor V gene by polymerase chain reaction (PCR) and fluorescent probe hybridization to the targeted region, followed by melting curve analysis with a real time PCR system. Although rare, false positive or false negative results may occur.  All results should be interpreted in context of clinical findings, relevant history, and other laboratory data. Health care providers, please contact your local [place] genetic counselor or call [phone]) for assistance with interpretation of these results.  This test was developed and its analytical performance characteristics have been determined by  [facility], [place], [place]. It has not been cleared or approved by the U.S. Food and Drug Administration. The FDA has determined that such clearance or approval is not necessary. This assay has been validated pursuant to [employer] and is used for clinical purposes. Performed at [facility]   Prothrombin gene mutation     Status: None   Collection Time: 09/22/14  4:10 PM  Result Value Ref Range   Interpretation-PTGENE: REPORT     Comment: (NOTE) This individual is negative (normal) for the G20210A mutation in the Prothrombin/Factor II gene.  Increased risk of thrombophilia can be caused by a variety of genetic and non-genetic factors not screened for  by this assay.    Recommendations-PTGENE: REPORT     Comment: THE G20210A MUTATION NOT DETECTED   Reviewer REPORT     Comment: (NOTE) Forestine Na, Ph.D.,FACMG, Director, Molecular Genetics SUPPLEMENTAL INFORMATION The G20210A mutation [[id number]:g.21538G>A (c.*97G>A)] in the Prothrombin/Factor II gene is the second most common inherited risk factor for thrombosis occurring in approximately 2% of Caucasians.  Presence of the mutation is associated with an elevation of prothrombin levels to about 30% above normal in heterozygotes and to 70% above normal in homozygotes. The G20210A mutation is detected by amplification of the selected region of Factor II gene by polymerase chain reaction (PCR) and fluorescent probe hybridization to the targeted region, followed by melting curve analysis with a real time PCR system. Although rare, false positive or false negative results may occur.  All results should be interpreted in context of clinical findings, relevant history, and other laboratory data. Health care providers, please contact your local [place] genetic counselor or c all [phone]) for assistance with interpretation of these results. This test was developed and its analytical performance characteristics have been determined by [facility], [place], [place]. It has not been cleared or approved by the U.S. Food and Drug Administration. The FDA has determined that such clearance or approval is not necessary. This assay has been validated pursuant to [employer] and is used for clinical purposes. Performed at [facility]   Cardiolipin antibodies, IgG, IgM, IgA     Status: Abnormal   Collection Time: 09/22/14  4:10 PM  Result Value Ref Range   Anticardiolipin IgG 9 (L) <23 GPL U/mL   Anticardiolipin IgM 7 (L) <11 MPL U/mL   Anticardiolipin IgA 7 (L) <22 APL U/mL    Comment: (NOTE) Reference Range:   Cardiolipin IgG   Normal                  <23   Low Positive (+)        23-35   Moderate Positive (+)   36-50   High Positive (+)       >50 Reference Range:  Cardiolipin IgM   Normal                  <11   Low Positive (+)        11-20   Moderate Positive (+)   21-30   High Positive (+)       >30 Reference Range:  Cardiolipin IgA   Normal                  <22   Low Positive (+)        22-35   Moderate Positive (+)   36-45   High Positive (+)       >45 Performed  at [facility]    Assessment/Plan: Stroke Patient doing well clinically.  Examination without concerning findings.  Left-sided strength at 4/5. Good grip of left hand. Continue current regimen, making sure to take the Xarelto. Follow-up with Neurology as scheduled.  Neurorehab begins next week.  Feel it will be extremely beneficial for patient.   DVT, lower extremity No swelling noted on exam.  Negative tenderness.  Denies chest pain, SOB. Continue Xarelto 15 mg BID until course is complete.  Then transition to 20 mg daily.  Follow-up with PCP in 1 month.

## 2014-10-04 NOTE — Assessment & Plan Note (Signed)
Patient doing well clinically.  Examination without concerning findings.  Left-sided strength at 4/5. Good grip of left hand. Continue current regimen, making sure to take the Xarelto. Follow-up with Neurology as scheduled.  Neurorehab begins next week.  Feel it will be extremely beneficial for patient.

## 2014-10-05 ENCOUNTER — Encounter: Payer: Self-pay | Admitting: Physical Therapy

## 2014-10-05 ENCOUNTER — Ambulatory Visit: Payer: Medicare Other | Attending: Neurology | Admitting: Physical Therapy

## 2014-10-05 DIAGNOSIS — I6930 Unspecified sequelae of cerebral infarction: Secondary | ICD-10-CM

## 2014-10-05 DIAGNOSIS — R279 Unspecified lack of coordination: Secondary | ICD-10-CM | POA: Insufficient documentation

## 2014-10-05 DIAGNOSIS — I6980 Unspecified sequelae of other cerebrovascular disease: Secondary | ICD-10-CM | POA: Insufficient documentation

## 2014-10-05 DIAGNOSIS — R209 Unspecified disturbances of skin sensation: Secondary | ICD-10-CM | POA: Insufficient documentation

## 2014-10-05 DIAGNOSIS — M6281 Muscle weakness (generalized): Secondary | ICD-10-CM | POA: Insufficient documentation

## 2014-10-05 NOTE — Therapy (Signed)
[facility] [address] [place], [place], [address] Phone: [phone]   Fax:  [phone]  Patient Details  Name: Paul Drake MRN: [id number] Date of Birth: 12-20-1945 Referring Provider:  Rosalin Hawking, MD  Encounter Date: 10/05/2014 Pt. Arrived for PT evaluation but stated that his main problem was his left hand - decreased fine motor coordination, dexterity and impaired sensation in LUE.;  Pt. States no problems with ambulation nor with balance at this time - pt. Is scheduled for OT evaluation tomorrow - pt. Requests OT only PT evaluation is not warranted at this time  Alda Lea, PT 10/05/2014, 12:20 PM  [facility] [address] [place], [place], [address] Phone: [phone]   Fax:  [phone]

## 2014-10-06 ENCOUNTER — Ambulatory Visit: Payer: Medicare Other | Admitting: Occupational Therapy

## 2014-10-06 ENCOUNTER — Encounter: Payer: Self-pay | Admitting: Occupational Therapy

## 2014-10-06 DIAGNOSIS — IMO0002 Reserved for concepts with insufficient information to code with codable children: Secondary | ICD-10-CM

## 2014-10-06 DIAGNOSIS — M6281 Muscle weakness (generalized): Secondary | ICD-10-CM

## 2014-10-06 DIAGNOSIS — R209 Unspecified disturbances of skin sensation: Secondary | ICD-10-CM | POA: Diagnosis not present

## 2014-10-06 DIAGNOSIS — R279 Unspecified lack of coordination: Secondary | ICD-10-CM | POA: Diagnosis not present

## 2014-10-06 DIAGNOSIS — I6980 Unspecified sequelae of other cerebrovascular disease: Secondary | ICD-10-CM | POA: Diagnosis not present

## 2014-10-11 ENCOUNTER — Ambulatory Visit: Payer: Medicare Other | Admitting: Occupational Therapy

## 2014-10-11 ENCOUNTER — Encounter: Payer: Self-pay | Admitting: Occupational Therapy

## 2014-10-11 DIAGNOSIS — R209 Unspecified disturbances of skin sensation: Secondary | ICD-10-CM | POA: Diagnosis not present

## 2014-10-11 DIAGNOSIS — M6281 Muscle weakness (generalized): Secondary | ICD-10-CM | POA: Diagnosis not present

## 2014-10-11 DIAGNOSIS — I6980 Unspecified sequelae of other cerebrovascular disease: Secondary | ICD-10-CM | POA: Diagnosis not present

## 2014-10-11 DIAGNOSIS — IMO0002 Reserved for concepts with insufficient information to code with codable children: Secondary | ICD-10-CM

## 2014-10-11 DIAGNOSIS — R279 Unspecified lack of coordination: Secondary | ICD-10-CM | POA: Diagnosis not present

## 2014-10-11 NOTE — Therapy (Signed)
[facility] [address] [place], [place], [address] Phone: [phone]   Fax:  [phone]  Occupational Therapy Evaluation  Patient Details  Name: Paul Drake MRN: [id number] Date of Birth: 07-Jul-1946 Referring Provider:  Mosie Lukes, MD  Encounter Date: 10/06/2014      OT End of Session - 10/11/14 1021    Visit Number 1   Number of Visits 17   Date for OT Re-Evaluation 12/04/14   Authorization Type Medicare/AARP- G code needed   Authorization - Visit Number 1   Authorization - Number of Visits 10   OT Start Time 6834   OT Stop Time 1100   OT Time Calculation (min) 45 min   Activity Tolerance Patient tolerated treatment well      Past Medical History  Diagnosis Date  . Kidney stones   . CVA (cerebral infarction)   . Depression   . Hyperlipemia   . Hypertension   . Chicken pox   . Seasonal allergies     some asthma  . Measles as a child  . Mumps as a child  . Allergic state 12/29/2013  . Unspecified asthma(493.90) 12/29/2013  . GERD (gastroesophageal reflux disease)   . Arthritis   . IBS (irritable bowel syndrome)   . Otitis externa 12/10/2013    Past Surgical History  Procedure Laterality Date  . Lithotripsy      multiple times  . Wisdom tooth extraction    . Colonoscopy    . Tee without cardioversion N/A 09/22/2014    Procedure: TRANSESOPHAGEAL ECHOCARDIOGRAM (TEE);  Surgeon: Josue Hector, MD;  Location: [facility] ENDOSCOPY;  Service: Cardiovascular;  Laterality: N/A;    There were no vitals taken for this visit.  Visit Diagnosis:  Lack of coordination due to stroke  Muscle weakness of right arm  Abnormal sensation of upper extremity        [facility] OT Assessment - 10/11/14 0001    Assessment   Diagnosis CVA   Onset Date 09/20/14   Assessment Pt with CVA 09/20/14.  Pt with L sided weakness and decreased coordination.  Pt hospitalized 1/4-09/22/14   Prior Therapy acute in hospital    Precautions   Precautions None   Restrictions   Weight Bearing Restrictions No   Balance Screen   Has the patient fallen in the past 6 months Yes   How many times? 1  tripped over curb   Has the patient had a decrease in activity level because of a fear of falling?  No   Is the patient reluctant to leave their home because of a fear of falling?  No   Home  Environment   Family/patient expects to be discharged to: Private residence   Lives With Spouse   Prior Function   Level of Independence Independent with gait;Independent with homemaking with ambulation;Independent with basic ADLs   Leisure Human resources officer, plays golf, art, woodworking, painting house   ADL   Eating/Feeding Modified independent  difficulty cutting food--using R hand   Grooming Modified independent  using both hands 50%   Upper Body Bathing Independent  using both hands   Lower Body Bathing Modified independent  using both hands    Upper Body Dressing --  mod I, difficulty with fasteners, latching watch   Lower Body Dressing Modified independent  difficulty with fasteners   Toilet Tranfer Modified independent  using R hand for hygiene   Tub/Shower Transfer Modified independent   ADL comments difficulty opening containers  IADL   Prior Level of Function Light Housekeeping pt performed   Light Housekeeping --  performs with difficulty and using BUEs   Prior Level of Function Meal Prep wife performed prior   Community Mobility Drives own vehicle   Medication Management Is responsible for taking medication in correct dosages at correct time   Prior Level of Function Financial Management wife has always performed   Mobility   Mobility Status Independent   Written Expression   Dominant Hand Left   Handwriting Increased time;100% legible  pt reports improved today, but performance fluctuates   Vision Assessment   Eye Alignment Within Functional Limits   Activity Tolerance   Activity Tolerance --  pt reports  that he needs break after 5-3min of activity   Cognition   Overall Cognitive Status Within Functional Limits for tasks assessed   Observation/Other Assessments   Stroke Impact Scale  30%   Sensation   Light Touch Impaired by gross assessment  unable to localize   Stereognosis Impaired by gross assessment   Hot/Cold Impaired by gross assessment  per pt   Proprioception Impaired by gross assessment   Coordination   Box and Blocks R-61block, L-37blocks   Right 9 Hole Peg Test unable within 23min   AROM   Overall AROM  Within functional limits for tasks performed   Overall AROM Comments difficulty with finger opposition coordination.   Strength   Overall Strength Within functional limits for tasks performed  proximal strength   Overall Strength Comments Grip strength:  R-86lbs, L-71lbs.                       OT Education - 10/11/14 1020    Education provided Yes   Education Details test temperature with R hand, use LUE for safe activities (nothing sharp, hot, breakable), sensation activities (stereognosis activities)   Person(s) Educated Patient   Methods Explanation   Comprehension Verbalized understanding          OT Short Term Goals - 10/11/14 1027    OT SHORT TERM GOAL #1   Title Pt will be independent with initial HEP.   Baseline due 11/06/14   Time 4   Period Weeks   Status New   OT SHORT TERM GOAL #2   Title Pt will improve coordination for ADLs as shown by improving score on box and blocks test by at least 12 with RUE.   Baseline due 11/06/14, 37 blocks   Time 4   Period Weeks   Status New   OT SHORT TERM GOAL #3   Title Pt will reports using RUE as dominant UE for ADLs at least 75% of the time.   Baseline due 11/06/14, 50%   Time 4   Period Weeks   Status New   OT SHORT TERM GOAL #4   Title Pt will be able to write with 100% legibility consistently.   Baseline due 11/06/14, not consistent   Time 4   Period Weeks   Status New            OT Long Term Goals - 10/11/14 1036    OT LONG TERM GOAL #1   Title Pt will be independent with updated HEP.   Baseline due 12/05/14   Time 8   Period Weeks   Status New   OT LONG TERM GOAL #2   Title Pt will improve coordination as shown by completing 9-hole peg test with R hand in less than 90sec.  Baseline unable,  due 12/05/14   Time 8   Period Weeks   Status New   OT LONG TERM GOAL #3   Title Pt will report using RUE as dominant UE at least 90% of the time for ADLs.   Baseline 50%, due 12/05/14   Time 8   Period Weeks   Status New   OT LONG TERM GOAL #4   Title Pt will complete fasteners independently.   Baseline due 12/05/14   Time 8   Period Weeks   Status New   OT LONG TERM GOAL #5   Title Pt will improve R grip strength by at least 12lbs to assist in opening containers/lifting objects.   Baseline 71lbs, due 12/05/14   Time 8   Period Weeks   Status New               Plan - 11/09/14 1022    Clinical Impression Statement Pt presents s/p CVA with R hand weakness, decreased sensation, decreased coordination and decreased R hand functional use.  Pt with hx of CVA but with no significant residual deficits per pt.   Rehab Potential Good   OT Frequency 2x / week  +eval   OT Duration 8 weeks   OT Treatment/Interventions Self-care/ADL training;Moist Heat;Fluidtherapy;DME and/or AE instruction;Splinting;Patient/family education;Therapeutic exercises;Ultrasound;Therapeutic exercise;Therapeutic activities;Cryotherapy;Neuromuscular education;Functional Mobility Training;Passive range of motion;Manual Therapy;Energy conservation;Parrafin;Electrical Stimulation;Contrast Bath   Plan HEP   Consulted and Agree with Plan of Care Patient          G-Codes - 09-Nov-2014 1030    Functional Assessment Tool Used 9-hole peg test:  unable   Functional Limitation Carrying, moving and handling objects   Carrying, Moving and Handling Objects Current Status [phone]) At least 80 percent  but less than 100 percent impaired, limited or restricted   Carrying, Moving and Handling Objects Goal Status (B7048) At least 20 percent but less than 40 percent impaired, limited or restricted      Problem List Patient Active Problem List   Diagnosis Date Noted  . DVT, lower extremity 10/04/2014  . Stroke 09/20/2014  . Barrett's esophagus 08/16/2014  . Lumbago 08/16/2014  . IBS (irritable bowel syndrome) 08/16/2014  . Abdominal pain 07/06/2014  . Calculus of kidney 06/28/2014  . Urgency of micturation 04/28/2014  . Allergic state 12/29/2013  . Unspecified asthma(493.90) 12/29/2013  . GERD (gastroesophageal reflux disease)   . Otitis externa 12/10/2013  . Need for prophylactic vaccination with combined diphtheria-tetanus-pertussis (DTP) vaccine 11/15/2013  . Encounter to establish care 11/15/2013  . Essential hypertension, benign 11/15/2013  . BPH (benign prostatic hyperplasia) 11/15/2013  . Recurrent nephrolithiasis 11/15/2013  . Depression with anxiety 11/15/2013  . Hyperlipidemia 11/15/2013  . Anosmia 11/15/2013  . History of CVA (cerebrovascular accident) 11/15/2013  . CMC arthritis, thumb, degenerative 11/04/2013    [facility], OTR/L 11-09-2014, 10:47 AM  [facility] [address] [place], [place], [address] Phone: [phone]   Fax:  [phone]

## 2014-10-11 NOTE — Therapy (Signed)
[facility] [address] [place], [place], [address] Phone: [phone]   Fax:  [phone]  Occupational Therapy Treatment  Patient Details  Name: Paul Drake MRN: [id number] Date of Birth: Aug 29, 1946 Referring Provider:  Mosie Lukes, MD  Encounter Date: 10/11/2014      OT End of Session - 10/11/14 1109    Visit Number 2   Number of Visits 17   Date for OT Re-Evaluation 12/04/14   Authorization Type Medicare/AARP- G code needed   Authorization - Visit Number 2   Authorization - Number of Visits 10   OT Start Time 1761   OT Stop Time 1149   OT Time Calculation (min) 47 min   Activity Tolerance Patient tolerated treatment well      Past Medical History  Diagnosis Date  . Kidney stones   . CVA (cerebral infarction)   . Depression   . Hyperlipemia   . Hypertension   . Chicken pox   . Seasonal allergies     some asthma  . Measles as a child  . Mumps as a child  . Allergic state 12/29/2013  . Unspecified asthma(493.90) 12/29/2013  . GERD (gastroesophageal reflux disease)   . Arthritis   . IBS (irritable bowel syndrome)   . Otitis externa 12/10/2013    Past Surgical History  Procedure Laterality Date  . Lithotripsy      multiple times  . Wisdom tooth extraction    . Colonoscopy    . Tee without cardioversion N/A 09/22/2014    Procedure: TRANSESOPHAGEAL ECHOCARDIOGRAM (TEE);  Surgeon: Josue Hector, MD;  Location: [facility] ENDOSCOPY;  Service: Cardiovascular;  Laterality: N/A;    There were no vitals taken for this visit.  Visit Diagnosis:  Lack of coordination due to stroke  Muscle weakness of right arm  Abnormal sensation of upper extremity      Subjective Assessment - 10/11/14 1106    Symptoms I don't seem to notice much change.   Currently in Pain? No/denies                         OT Education - 10/11/14 1110    Education Details coordination HEP, L-handed typing words    Person(s) Educated Patient   Methods Explanation;Demonstration;Verbal cues;Handout   Comprehension Verbalized understanding;Returned demonstration          OT Short Term Goals - 10/11/14 1027    OT SHORT TERM GOAL #1   Title Pt will be independent with initial HEP.   Baseline due 11/06/14   Time 4   Period Weeks   Status New   OT SHORT TERM GOAL #2   Title Pt will improve coordination for ADLs as shown by improving score on box and blocks test by at least 12 with RUE.   Baseline due 11/06/14, 37 blocks   Time 4   Period Weeks   Status New   OT SHORT TERM GOAL #3   Title Pt will reports using RUE as dominant UE for ADLs at least 75% of the time.   Baseline due 11/06/14, 50%   Time 4   Period Weeks   Status New   OT SHORT TERM GOAL #4   Title Pt will be able to write with 100% legibility consistently.   Baseline due 11/06/14, not consistent   Time 4   Period Weeks   Status New           OT Long  Term Goals - 10/11/14 1036    OT LONG TERM GOAL #1   Title Pt will be independent with updated HEP.   Baseline due 12/05/14   Time 8   Period Weeks   Status New   OT LONG TERM GOAL #2   Title Pt will improve coordination as shown by completing 9-hole peg test with R hand in less than 90sec.   Baseline unable,  due 12/05/14   Time 8   Period Weeks   Status New   OT LONG TERM GOAL #3   Title Pt will report using RUE as dominant UE at least 90% of the time for ADLs.   Baseline 50%, due 12/05/14   Time 8   Period Weeks   Status New   OT LONG TERM GOAL #4   Title Pt will complete fasteners independently.   Baseline due 12/05/14   Time 8   Period Weeks   Status New   OT LONG TERM GOAL #5   Title Pt will improve R grip strength by at least 12lbs to assist in opening containers/lifting objects.   Baseline 71lbs, due 12/05/14   Time 8   Period Weeks   Status New               Plan - 10/11/14 1142    Clinical Impression Statement Pt demo increase coordination with  repetition/cues.  Decreased sensation is primary limiting factor.   Plan coordination, activities to challenge sensation.        Problem List Patient Active Problem List   Diagnosis Date Noted  . DVT, lower extremity 10/04/2014  . Stroke 09/20/2014  . Barrett's esophagus 08/16/2014  . Lumbago 08/16/2014  . IBS (irritable bowel syndrome) 08/16/2014  . Abdominal pain 07/06/2014  . Calculus of kidney 06/28/2014  . Urgency of micturation 04/28/2014  . Allergic state 12/29/2013  . Unspecified asthma(493.90) 12/29/2013  . GERD (gastroesophageal reflux disease)   . Otitis externa 12/10/2013  . Need for prophylactic vaccination with combined diphtheria-tetanus-pertussis (DTP) vaccine 11/15/2013  . Encounter to establish care 11/15/2013  . Essential hypertension, benign 11/15/2013  . BPH (benign prostatic hyperplasia) 11/15/2013  . Recurrent nephrolithiasis 11/15/2013  . Depression with anxiety 11/15/2013  . Hyperlipidemia 11/15/2013  . Anosmia 11/15/2013  . History of CVA (cerebrovascular accident) 11/15/2013  . CMC arthritis, thumb, degenerative 11/04/2013    [facility] , OTR/L  10/11/2014, 12:00 PM  [facility] [address] [place], [place], [address] Phone: [phone]   Fax:  [phone]

## 2014-10-11 NOTE — Patient Instructions (Addendum)
  Coordination Activities  Perform the following activities for 20-30 minutes 1-2 times per day with left hand(s).   Rotate ball in fingertips (clockwise and counter-clockwise).  Toss ball between hands.  Toss ball in air and catch with the same hand.  Ball push-up:  Hold ball in fingertips bend fingers to bring ball to palm then straighten fingers to push ball up  Flip cards 1 at a time as fast as you can.  Deal cards with your thumb (Hold deck in hand and push card off top with thumb).  Rotate card in hand (clockwise and counter-clockwise).  Pick up coins and stack.  Pick up coins one at a time until you get 5-10 in your hand, then move coins from palm to fingertips to place in container/coin bank one at a time.  Practice writing and/or typing.  L-handed typing words    Typing:  Left-handed Words  1. wear 2. were 3. are 4. bear 5. bare 6. care 7. dear 8. deer 9. stare 10. react 11. dare  12. sad 13. gear 14. great 15. rate  16. date 17. red 18. read 19. fear 20. free 21. tea 22. tear 23. fast 24. waste 25. treat 26. tease 27. zest 28. wax 29. cast 30. vase 31. vast 32. cat 33. basket 34. earn 35. casket 36. bee 37. seat 38. ate 39. gas 40. rare 41. seed 42. tax 43. fax 44. feed 45. cart 46. art 47. tart 48. taste 49. ear 50. here

## 2014-10-14 ENCOUNTER — Ambulatory Visit: Payer: Medicare Other | Admitting: Occupational Therapy

## 2014-10-14 DIAGNOSIS — R209 Unspecified disturbances of skin sensation: Secondary | ICD-10-CM

## 2014-10-14 DIAGNOSIS — R279 Unspecified lack of coordination: Secondary | ICD-10-CM | POA: Diagnosis not present

## 2014-10-14 DIAGNOSIS — M6281 Muscle weakness (generalized): Secondary | ICD-10-CM | POA: Diagnosis not present

## 2014-10-14 DIAGNOSIS — IMO0002 Reserved for concepts with insufficient information to code with codable children: Secondary | ICD-10-CM

## 2014-10-14 DIAGNOSIS — I6980 Unspecified sequelae of other cerebrovascular disease: Secondary | ICD-10-CM | POA: Diagnosis not present

## 2014-10-14 NOTE — Therapy (Signed)
[facility] [address] [place], [place], [address] Phone: [phone]   Fax:  [phone]  Occupational Therapy Treatment  Patient Details  Name: Paul Drake MRN: [id number] Date of Birth: 1946/04/08 Referring Provider:  Mosie Lukes, MD  Encounter Date: 10/14/2014      OT End of Session - 10/14/14 1026    Visit Number 3   Number of Visits 17   Date for OT Re-Evaluation 12/04/14   Authorization Type Medicare/AARP- G code needed   Authorization - Visit Number 3   Authorization - Number of Visits 10   OT Start Time 1017   OT Stop Time 1100   OT Time Calculation (min) 43 min   Activity Tolerance Patient tolerated treatment well      Past Medical History  Diagnosis Date  . Kidney stones   . CVA (cerebral infarction)   . Depression   . Hyperlipemia   . Hypertension   . Chicken pox   . Seasonal allergies     some asthma  . Measles as a child  . Mumps as a child  . Allergic state 12/29/2013  . Unspecified asthma(493.90) 12/29/2013  . GERD (gastroesophageal reflux disease)   . Arthritis   . IBS (irritable bowel syndrome)   . Otitis externa 12/10/2013    Past Surgical History  Procedure Laterality Date  . Lithotripsy      multiple times  . Wisdom tooth extraction    . Colonoscopy    . Tee without cardioversion N/A 09/22/2014    Procedure: TRANSESOPHAGEAL ECHOCARDIOGRAM (TEE);  Surgeon: Josue Hector, MD;  Location: [facility] ENDOSCOPY;  Service: Cardiovascular;  Laterality: N/A;    There were no vitals taken for this visit.  Visit Diagnosis:  Lack of coordination due to stroke  Muscle weakness of right arm  Abnormal sensation of upper extremity      Subjective Assessment - 10/14/14 1021    Symptoms   "I didn't do my exercises yesterday because I cleaned the house."  Pt reports fatigue and achiness by the end of the day.  I could get my wallet out of my back pocket.  It's slowly getting better.   Currently in Pain? No/denies                 OT Treatments/Exercises (OP) - 10/14/14 0001    Fine Motor Coordination   Other Fine Motor Exercises Placing small pegs in pegboard to copy design with mod difficulty.   Sensation Exercises   Stereognosis Pt unable to identify object or report characteristics of objects.   Sensory Retraining Pulling items from rice bath with eyes open with min difficulty, with eyes closed--unable.                  OT Education - 10/14/14 1236    Education provided Yes   Education Details activities to promote sensory re-education   Person(s) Educated Patient   Methods Explanation;Demonstration;Handout;Verbal cues   Comprehension Verbalized understanding;Returned demonstration          OT Short Term Goals - 10/11/14 1027    OT SHORT TERM GOAL #1   Title Pt will be independent with initial HEP.   Baseline due 11/06/14   Time 4   Period Weeks   Status New   OT SHORT TERM GOAL #2   Title Pt will improve coordination for ADLs as shown by improving score on box and blocks test by at least 12 with RUE.   Baseline  due 11/06/14, 37 blocks   Time 4   Period Weeks   Status New   OT SHORT TERM GOAL #3   Title Pt will reports using RUE as dominant UE for ADLs at least 75% of the time.   Baseline due 11/06/14, 50%   Time 4   Period Weeks   Status New   OT SHORT TERM GOAL #4   Title Pt will be able to write with 100% legibility consistently.   Baseline due 11/06/14, not consistent   Time 4   Period Weeks   Status New           OT Long Term Goals - 10/11/14 1036    OT LONG TERM GOAL #1   Title Pt will be independent with updated HEP.   Baseline due 12/05/14   Time 8   Period Weeks   Status New   OT LONG TERM GOAL #2   Title Pt will improve coordination as shown by completing 9-hole peg test with R hand in less than 90sec.   Baseline unable,  due 12/05/14   Time 8   Period Weeks   Status New   OT LONG TERM GOAL #3   Title Pt  will report using RUE as dominant UE at least 90% of the time for ADLs.   Baseline 50%, due 12/05/14   Time 8   Period Weeks   Status New   OT LONG TERM GOAL #4   Title Pt will complete fasteners independently.   Baseline due 12/05/14   Time 8   Period Weeks   Status New   OT LONG TERM GOAL #5   Title Pt will improve R grip strength by at least 12lbs to assist in opening containers/lifting objects.   Baseline 71lbs, due 12/05/14   Time 8   Period Weeks   Status New               Plan - 10/14/14 1059    Clinical Impression Statement Pt progressing with coordination.  No improvement in sensation.   Plan coordination, writing, AE/strategies for writing.        Problem List Patient Active Problem List   Diagnosis Date Noted  . DVT, lower extremity 10/04/2014  . Stroke 09/20/2014  . Barrett's esophagus 08/16/2014  . Lumbago 08/16/2014  . IBS (irritable bowel syndrome) 08/16/2014  . Abdominal pain 07/06/2014  . Calculus of kidney 06/28/2014  . Urgency of micturation 04/28/2014  . Allergic state 12/29/2013  . Unspecified asthma(493.90) 12/29/2013  . GERD (gastroesophageal reflux disease)   . Otitis externa 12/10/2013  . Need for prophylactic vaccination with combined diphtheria-tetanus-pertussis (DTP) vaccine 11/15/2013  . Encounter to establish care 11/15/2013  . Essential hypertension, benign 11/15/2013  . BPH (benign prostatic hyperplasia) 11/15/2013  . Recurrent nephrolithiasis 11/15/2013  . Depression with anxiety 11/15/2013  . Hyperlipidemia 11/15/2013  . Anosmia 11/15/2013  . History of CVA (cerebrovascular accident) 11/15/2013  . CMC arthritis, thumb, degenerative 11/04/2013    [facility], OTR/L 10/14/2014, 12:42 PM  [facility] [address] [place], [place], [address] Phone: [phone]   Fax:  [phone]

## 2014-10-14 NOTE — Patient Instructions (Signed)
Sensation Activities:   1.  Place various small objects in a box.  Close your eyes and pick up an object.  Move it in your hand to try to determine the shape/what it is.  Then open your eyes and manipulate it in your hand while looking at it.  2. Fill bowl/container with rice.  Place small objects of various sizes in rice.  Try to pull objects out one at a time without pulling out rice.  3. Rub different textures (cotton balls, jeans material, wool, brush, velvet, towel, etc) on hand/arm.

## 2014-10-18 ENCOUNTER — Ambulatory Visit: Payer: Medicare Other | Attending: Neurology | Admitting: Occupational Therapy

## 2014-10-18 ENCOUNTER — Encounter: Payer: Self-pay | Admitting: Occupational Therapy

## 2014-10-18 DIAGNOSIS — M6281 Muscle weakness (generalized): Secondary | ICD-10-CM | POA: Insufficient documentation

## 2014-10-18 DIAGNOSIS — R279 Unspecified lack of coordination: Secondary | ICD-10-CM | POA: Insufficient documentation

## 2014-10-18 DIAGNOSIS — R209 Unspecified disturbances of skin sensation: Secondary | ICD-10-CM | POA: Diagnosis not present

## 2014-10-18 DIAGNOSIS — I6980 Unspecified sequelae of other cerebrovascular disease: Secondary | ICD-10-CM | POA: Diagnosis not present

## 2014-10-18 DIAGNOSIS — IMO0002 Reserved for concepts with insufficient information to code with codable children: Secondary | ICD-10-CM

## 2014-10-18 NOTE — Therapy (Signed)
[facility] [address] [place], [place], [address] Phone: [phone]   Fax:  [phone]  Occupational Therapy Treatment  Patient Details  Name: Paul Drake MRN: [id number] Date of Birth: 31-Jul-1946 Referring Provider:  Mosie Lukes, MD  Encounter Date: 10/18/2014      OT End of Session - 10/18/14 1121    Visit Number 4   Number of Visits 17   Date for OT Re-Evaluation 12/04/14   Authorization Type Medicare/AARP- G code needed   Authorization - Visit Number 4   Authorization - Number of Visits 10   OT Start Time 1105   OT Stop Time 1145   OT Time Calculation (min) 40 min   Activity Tolerance Patient tolerated treatment well      Past Medical History  Diagnosis Date  . Kidney stones   . CVA (cerebral infarction)   . Depression   . Hyperlipemia   . Hypertension   . Chicken pox   . Seasonal allergies     some asthma  . Measles as a child  . Mumps as a child  . Allergic state 12/29/2013  . Unspecified asthma(493.90) 12/29/2013  . GERD (gastroesophageal reflux disease)   . Arthritis   . IBS (irritable bowel syndrome)   . Otitis externa 12/10/2013    Past Surgical History  Procedure Laterality Date  . Lithotripsy      multiple times  . Wisdom tooth extraction    . Colonoscopy    . Tee without cardioversion N/A 09/22/2014    Procedure: TRANSESOPHAGEAL ECHOCARDIOGRAM (TEE);  Surgeon: Josue Hector, MD;  Location: [facility] ENDOSCOPY;  Service: Cardiovascular;  Laterality: N/A;    There were no vitals taken for this visit.  Visit Diagnosis:  Lack of coordination due to stroke  Muscle weakness of right arm  Abnormal sensation of upper extremity      Subjective Assessment - 10/18/14 1116    Symptoms Pt reports that he did color some (in adult coloring book).   Currently in Pain? No/denies                 OT Treatments/Exercises (OP) - 10/18/14 0001    ADLs   Writing Practiced writing  with tan foam grip and the pencil grip.  Pt reports/demo increased control with both and decreased time with writing (pencil grip>foam).  Issued pencil grip for pen and foam grips for paintbrushes.     Fine Motor Coordination   In Hand Manipulation Training to flip cylinder pegs with mod difficulty/cues   Grooved pegs with mod-max difficulty with in-hand manipulation   Functional Reaching Activities   High Level to place/remove clothespins with 1-8lb resistance for graded movement and increased strength/coordination with min difficulty                   OT Short Term Goals - 10/11/14 1027    OT SHORT TERM GOAL #1   Title Pt will be independent with initial HEP.   Baseline due 11/06/14   Time 4   Period Weeks   Status New   OT SHORT TERM GOAL #2   Title Pt will improve coordination for ADLs as shown by improving score on box and blocks test by at least 12 with RUE.   Baseline due 11/06/14, 37 blocks   Time 4   Period Weeks   Status New   OT SHORT TERM GOAL #3   Title Pt will reports using RUE as dominant UE for  ADLs at least 75% of the time.   Baseline due 11/06/14, 50%   Time 4   Period Weeks   Status New   OT SHORT TERM GOAL #4   Title Pt will be able to write with 100% legibility consistently.   Baseline due 11/06/14, not consistent   Time 4   Period Weeks   Status New           OT Long Term Goals - 10/11/14 1036    OT LONG TERM GOAL #1   Title Pt will be independent with updated HEP.   Baseline due 12/05/14   Time 8   Period Weeks   Status New   OT LONG TERM GOAL #2   Title Pt will improve coordination as shown by completing 9-hole peg test with R hand in less than 90sec.   Baseline unable,  due 12/05/14   Time 8   Period Weeks   Status New   OT LONG TERM GOAL #3   Title Pt will report using RUE as dominant UE at least 90% of the time for ADLs.   Baseline 50%, due 12/05/14   Time 8   Period Weeks   Status New   OT LONG TERM GOAL #4   Title Pt will  complete fasteners independently.   Baseline due 12/05/14   Time 8   Period Weeks   Status New   OT LONG TERM GOAL #5   Title Pt will improve R grip strength by at least 12lbs to assist in opening containers/lifting objects.   Baseline 71lbs, due 12/05/14   Time 8   Period Weeks   Status New               Plan - 10/18/14 1333    Clinical Impression Statement Pt progressing with writing and in-hand manipulation.   Plan coordination        Problem List Patient Active Problem List   Diagnosis Date Noted  . DVT, lower extremity 10/04/2014  . Stroke 09/20/2014  . Barrett's esophagus 08/16/2014  . Lumbago 08/16/2014  . IBS (irritable bowel syndrome) 08/16/2014  . Abdominal pain 07/06/2014  . Calculus of kidney 06/28/2014  . Urgency of micturation 04/28/2014  . Allergic state 12/29/2013  . Unspecified asthma(493.90) 12/29/2013  . GERD (gastroesophageal reflux disease)   . Otitis externa 12/10/2013  . Need for prophylactic vaccination with combined diphtheria-tetanus-pertussis (DTP) vaccine 11/15/2013  . Encounter to establish care 11/15/2013  . Essential hypertension, benign 11/15/2013  . BPH (benign prostatic hyperplasia) 11/15/2013  . Recurrent nephrolithiasis 11/15/2013  . Depression with anxiety 11/15/2013  . Hyperlipidemia 11/15/2013  . Anosmia 11/15/2013  . History of CVA (cerebrovascular accident) 11/15/2013  . CMC arthritis, thumb, degenerative 11/04/2013    [facility], OTR/L 10/18/2014, 1:34 PM  [facility] [address] [place], [place], [address] Phone: [phone]   Fax:  [phone]

## 2014-10-21 ENCOUNTER — Ambulatory Visit: Payer: Medicare Other | Admitting: Occupational Therapy

## 2014-10-21 ENCOUNTER — Encounter: Payer: Self-pay | Admitting: Occupational Therapy

## 2014-10-21 DIAGNOSIS — M6281 Muscle weakness (generalized): Secondary | ICD-10-CM | POA: Diagnosis not present

## 2014-10-21 DIAGNOSIS — R279 Unspecified lack of coordination: Secondary | ICD-10-CM | POA: Diagnosis not present

## 2014-10-21 DIAGNOSIS — I6980 Unspecified sequelae of other cerebrovascular disease: Secondary | ICD-10-CM | POA: Diagnosis not present

## 2014-10-21 DIAGNOSIS — R209 Unspecified disturbances of skin sensation: Secondary | ICD-10-CM

## 2014-10-21 DIAGNOSIS — IMO0002 Reserved for concepts with insufficient information to code with codable children: Secondary | ICD-10-CM

## 2014-10-21 NOTE — Therapy (Signed)
[facility] [address] [place], [place], [address] Phone: [phone]   Fax:  [phone]  Occupational Therapy Treatment  Patient Details  Name: Paul Drake MRN: [id number] Date of Birth: 07-Feb-1946 Referring Provider:  Mosie Lukes, MD  Encounter Date: 10/21/2014      OT End of Session - 10/21/14 1132    Visit Number 5   Number of Visits 17   Date for OT Re-Evaluation 12/04/14   Authorization Type Medicare/AARP- G code needed   Authorization - Visit Number 5   Authorization - Number of Visits 10   OT Start Time 2542   OT Stop Time 1145   OT Time Calculation (min) 40 min   Activity Tolerance Patient tolerated treatment well      Past Medical History  Diagnosis Date  . Kidney stones   . CVA (cerebral infarction)   . Depression   . Hyperlipemia   . Hypertension   . Chicken pox   . Seasonal allergies     some asthma  . Measles as a child  . Mumps as a child  . Allergic state 12/29/2013  . Unspecified asthma(493.90) 12/29/2013  . GERD (gastroesophageal reflux disease)   . Arthritis   . IBS (irritable bowel syndrome)   . Otitis externa 12/10/2013    Past Surgical History  Procedure Laterality Date  . Lithotripsy      multiple times  . Wisdom tooth extraction    . Colonoscopy    . Tee without cardioversion N/A 09/22/2014    Procedure: TRANSESOPHAGEAL ECHOCARDIOGRAM (TEE);  Surgeon: Josue Hector, MD;  Location: [facility] ENDOSCOPY;  Service: Cardiovascular;  Laterality: N/A;    There were no vitals taken for this visit.  Visit Diagnosis:  No diagnosis found.      Subjective Assessment - 10/21/14 1109    Symptoms Pt reports that paintbrushes were easier to hold yesterday.  I can get my wallet out easier now, but I still have trouble knowing if I have it in my hand.   Currently in Pain? No/denies                 OT Treatments/Exercises (OP) - 10/21/14 0001    Fine Motor Coordination   In  Hand Manipulation Training rotating relaxation balls with mod cues, mod-max difficulty   Small Pegboard placing in to copy design with min-mod difficulty.  Removing with ea. finger/thumb with min-mod difficulty.   Other Fine Motor Exercises Pulling bag into palm with isolated finger flexion/extension for seperation of 2 sides of the hand for coordination with mod-max difficulty and mod cues   Sensation Exercises   Sensory Retraining Pulling small pegs from green putty for increased hand strength, coordination, and to challenge sensation.  Pt reports difficulty determining how much pressure he is placing on putty and reports feeling pegs inconsistently.  Pt reports pain at thumbnail with too much pressure.                     OT Short Term Goals - 10/11/14 1027    OT SHORT TERM GOAL #1   Title Pt will be independent with initial HEP.   Baseline due 11/06/14   Time 4   Period Weeks   Status New   OT SHORT TERM GOAL #2   Title Pt will improve coordination for ADLs as shown by improving score on box and blocks test by at least 12 with RUE.   Baseline due 11/06/14, 37  blocks   Time 4   Period Weeks   Status New   OT SHORT TERM GOAL #3   Title Pt will reports using RUE as dominant UE for ADLs at least 75% of the time.   Baseline due 11/06/14, 50%   Time 4   Period Weeks   Status New   OT SHORT TERM GOAL #4   Title Pt will be able to write with 100% legibility consistently.   Baseline due 11/06/14, not consistent   Time 4   Period Weeks   Status New           OT Long Term Goals - 10/11/14 1036    OT LONG TERM GOAL #1   Title Pt will be independent with updated HEP.   Baseline due 12/05/14   Time 8   Period Weeks   Status New   OT LONG TERM GOAL #2   Title Pt will improve coordination as shown by completing 9-hole peg test with R hand in less than 90sec.   Baseline unable,  due 12/05/14   Time 8   Period Weeks   Status New   OT LONG TERM GOAL #3   Title Pt will report  using RUE as dominant UE at least 90% of the time for ADLs.   Baseline 50%, due 12/05/14   Time 8   Period Weeks   Status New   OT LONG TERM GOAL #4   Title Pt will complete fasteners independently.   Baseline due 12/05/14   Time 8   Period Weeks   Status New   OT LONG TERM GOAL #5   Title Pt will improve R grip strength by at least 12lbs to assist in opening containers/lifting objects.   Baseline 71lbs, due 12/05/14   Time 8   Period Weeks   Status New               Plan - 10/21/14 1133    Clinical Impression Statement Sensation continues to be significantly impaired, but pt reports improved functional use of L hand.   Plan coordination, sensory retraining        Problem List Patient Active Problem List   Diagnosis Date Noted  . DVT, lower extremity 10/04/2014  . Stroke 09/20/2014  . Barrett's esophagus 08/16/2014  . Lumbago 08/16/2014  . IBS (irritable bowel syndrome) 08/16/2014  . Abdominal pain 07/06/2014  . Calculus of kidney 06/28/2014  . Urgency of micturation 04/28/2014  . Allergic state 12/29/2013  . Unspecified asthma(493.90) 12/29/2013  . GERD (gastroesophageal reflux disease)   . Otitis externa 12/10/2013  . Need for prophylactic vaccination with combined diphtheria-tetanus-pertussis (DTP) vaccine 11/15/2013  . Encounter to establish care 11/15/2013  . Essential hypertension, benign 11/15/2013  . BPH (benign prostatic hyperplasia) 11/15/2013  . Recurrent nephrolithiasis 11/15/2013  . Depression with anxiety 11/15/2013  . Hyperlipidemia 11/15/2013  . Anosmia 11/15/2013  . History of CVA (cerebrovascular accident) 11/15/2013  . CMC arthritis, thumb, degenerative 11/04/2013    [facility], OTR/L 10/21/2014, 11:55 AM  [facility] [address] [place], [place], [address] Phone: [phone]   Fax:  [phone]

## 2014-10-21 NOTE — Patient Instructions (Signed)
Rotate 2 golf balls in your hand.

## 2014-10-22 ENCOUNTER — Telehealth: Payer: Self-pay | Admitting: Neurology

## 2014-10-22 NOTE — Telephone Encounter (Signed)
Patient is calling because he will finish his starter pack of Xarelto 15mg /20mg  this coming Sunday and does not know if she should continue the medication. If so a Rx needs to be called to [facility] [address]. Please call patient and advise. Thank you.

## 2014-10-22 NOTE — Telephone Encounter (Signed)
Dr Erlinda Hong already sent a Rx for the maintenance dose to [facility].  I called the patient back.  He is aware, and will call us back if anything further is needed.

## 2014-10-25 ENCOUNTER — Encounter: Payer: Self-pay | Admitting: Occupational Therapy

## 2014-10-25 ENCOUNTER — Ambulatory Visit: Payer: Medicare Other | Admitting: Occupational Therapy

## 2014-10-25 DIAGNOSIS — R209 Unspecified disturbances of skin sensation: Secondary | ICD-10-CM

## 2014-10-25 DIAGNOSIS — I6980 Unspecified sequelae of other cerebrovascular disease: Secondary | ICD-10-CM | POA: Diagnosis not present

## 2014-10-25 DIAGNOSIS — IMO0002 Reserved for concepts with insufficient information to code with codable children: Secondary | ICD-10-CM

## 2014-10-25 DIAGNOSIS — M6281 Muscle weakness (generalized): Secondary | ICD-10-CM

## 2014-10-25 DIAGNOSIS — R279 Unspecified lack of coordination: Secondary | ICD-10-CM | POA: Diagnosis not present

## 2014-10-25 NOTE — Therapy (Signed)
[facility] [address] [place], [place], [address] Phone: [phone]   Fax:  [phone]  Occupational Therapy Treatment  Patient Details  Name: Paul Drake MRN: [id number] Date of Birth: Apr 27, 1946 Referring Provider:  Mosie Lukes, MD  Encounter Date: 10/25/2014      OT End of Session - 10/25/14 1119    Visit Number 6   Number of Visits 17   Date for OT Re-Evaluation 12/04/14   Authorization Type Medicare/AARP- G code needed   Authorization - Visit Number 6   Authorization - Number of Visits 10   OT Start Time 1108   OT Stop Time 1146   OT Time Calculation (min) 38 min   Activity Tolerance Patient tolerated treatment well      Past Medical History  Diagnosis Date  . Kidney stones   . CVA (cerebral infarction)   . Depression   . Hyperlipemia   . Hypertension   . Chicken pox   . Seasonal allergies     some asthma  . Measles as a child  . Mumps as a child  . Allergic state 12/29/2013  . Unspecified asthma(493.90) 12/29/2013  . GERD (gastroesophageal reflux disease)   . Arthritis   . IBS (irritable bowel syndrome)   . Otitis externa 12/10/2013    Past Surgical History  Procedure Laterality Date  . Lithotripsy      multiple times  . Wisdom tooth extraction    . Colonoscopy    . Tee without cardioversion N/A 09/22/2014    Procedure: TRANSESOPHAGEAL ECHOCARDIOGRAM (TEE);  Surgeon: Josue Hector, MD;  Location: [facility] ENDOSCOPY;  Service: Cardiovascular;  Laterality: N/A;    There were no vitals taken for this visit.  Visit Diagnosis:  Lack of coordination due to stroke  Muscle weakness of right arm  Abnormal sensation of upper extremity      Subjective Assessment - 10/25/14 1117    Symptoms Pt reports that he has been rotating the golf balls and that it's gotten easier.  Pt also doing adult coloring books.   Currently in Pain? Yes   Pain Score 3    Pain Location Head   Pain Orientation  Left   Pain Descriptors / Indicators Tightness   Pain Type Acute pain   Pain Onset In the past 7 days  past 3 days   Aggravating Factors  unknown   Pain Relieving Factors unknown                 OT Treatments/Exercises (OP) - 10/25/14 0001    Fine Motor Coordination   Other Fine Motor Exercises Placing small pegs in pegboard with min difficulty, removing with each finger/thumb with min difficulty   Other Fine Motor Exercises Manipulating objects in hand to seperate beans from pegs with min-mod difficulty.   Sensation Exercises   Stereognosis Pt unable to pull pegs from bowl of beans with eyes closed (unable to determine difference in shapes), but able to remove pegs from beans with eyes open.   Sensory Retraining Using 55lbs sustained grip strength to stack blocks and unstack for controlled/graded grasp and improved grip strength with min difficulty.                  OT Short Term Goals - 10/25/14 1131    OT SHORT TERM GOAL #1   Title Pt will be independent with initial HEP.   Baseline due 11/06/14   Time 4   Period Weeks  Status Achieved   OT SHORT TERM GOAL #2   Title Pt will improve coordination for ADLs as shown by improving score on box and blocks test by at least 12 with RUE.   Baseline due 11/06/14, 37 blocks   Time 4   Period Weeks   Status New   OT SHORT TERM GOAL #3   Title Pt will reports using RUE as dominant UE for ADLs at least 75% of the time.   Baseline due 11/06/14, 50%   Time 4   Period Weeks   Status Achieved  75-90% of the time   OT SHORT TERM GOAL #4   Title Pt will be able to write with 100% legibility consistently.   Baseline due 11/06/14, not consistent   Time 4   Period Weeks   Status New           OT Long Term Goals - 10/11/14 1036    OT LONG TERM GOAL #1   Title Pt will be independent with updated HEP.   Baseline due 12/05/14   Time 8   Period Weeks   Status New   OT LONG TERM GOAL #2   Title Pt will improve  coordination as shown by completing 9-hole peg test with R hand in less than 90sec.   Baseline unable,  due 12/05/14   Time 8   Period Weeks   Status New   OT LONG TERM GOAL #3   Title Pt will report using RUE as dominant UE at least 90% of the time for ADLs.   Baseline 50%, due 12/05/14   Time 8   Period Weeks   Status New   OT LONG TERM GOAL #4   Title Pt will complete fasteners independently.   Baseline due 12/05/14   Time 8   Period Weeks   Status New   OT LONG TERM GOAL #5   Title Pt will improve R grip strength by at least 12lbs to assist in opening containers/lifting objects.   Baseline 71lbs, due 12/05/14   Time 8   Period Weeks   Status New               Plan - 10/25/14 1120    Clinical Impression Statement no change in sensation, but L hand coordination/functional use improved.  STG #1 and #3  met.   Plan check STGs next week, coordination, sensory retraining        Problem List Patient Active Problem List   Diagnosis Date Noted  . DVT, lower extremity 10/04/2014  . Stroke 09/20/2014  . Barrett's esophagus 08/16/2014  . Lumbago 08/16/2014  . IBS (irritable bowel syndrome) 08/16/2014  . Abdominal pain 07/06/2014  . Calculus of kidney 06/28/2014  . Urgency of micturation 04/28/2014  . Allergic state 12/29/2013  . Unspecified asthma(493.90) 12/29/2013  . GERD (gastroesophageal reflux disease)   . Otitis externa 12/10/2013  . Need for prophylactic vaccination with combined diphtheria-tetanus-pertussis (DTP) vaccine 11/15/2013  . Encounter to establish care 11/15/2013  . Essential hypertension, benign 11/15/2013  . BPH (benign prostatic hyperplasia) 11/15/2013  . Recurrent nephrolithiasis 11/15/2013  . Depression with anxiety 11/15/2013  . Hyperlipidemia 11/15/2013  . Anosmia 11/15/2013  . History of CVA (cerebrovascular accident) 11/15/2013  . CMC arthritis, thumb, degenerative 11/04/2013    [facility], OTR/L 10/25/2014, 11:47 AM  [facility] [address] [place], [place], [address] Phone: [phone]   Fax:  [phone]

## 2014-10-26 ENCOUNTER — Other Ambulatory Visit: Payer: Self-pay | Admitting: Family Medicine

## 2014-10-26 MED ORDER — SIMVASTATIN 20 MG PO TABS: 20.0000 mg | ORAL_TABLET | Freq: Every day | ORAL | Status: DC

## 2014-10-27 ENCOUNTER — Telehealth: Payer: Self-pay | Admitting: *Deleted

## 2014-10-28 ENCOUNTER — Ambulatory Visit: Payer: Medicare Other | Admitting: Occupational Therapy

## 2014-10-28 ENCOUNTER — Encounter: Payer: Self-pay | Admitting: Occupational Therapy

## 2014-10-28 DIAGNOSIS — M6281 Muscle weakness (generalized): Secondary | ICD-10-CM | POA: Diagnosis not present

## 2014-10-28 DIAGNOSIS — I6980 Unspecified sequelae of other cerebrovascular disease: Secondary | ICD-10-CM | POA: Diagnosis not present

## 2014-10-28 DIAGNOSIS — IMO0002 Reserved for concepts with insufficient information to code with codable children: Secondary | ICD-10-CM

## 2014-10-28 DIAGNOSIS — R279 Unspecified lack of coordination: Secondary | ICD-10-CM | POA: Diagnosis not present

## 2014-10-28 DIAGNOSIS — R209 Unspecified disturbances of skin sensation: Secondary | ICD-10-CM | POA: Diagnosis not present

## 2014-10-28 NOTE — Therapy (Signed)
[facility] [address] [place], [place], [address] Phone: [phone]   Fax:  [phone]  Occupational Therapy Treatment  Patient Details  Name: Paul Drake MRN: [id number] Date of Birth: [birth year] Referring Provider:  Mosie Lukes, MD  Encounter Date: 10/28/2014      OT End of Session - 10/28/14 1114    Visit Number 7   Number of Visits 17   Date for OT Re-Evaluation 12/04/14   Authorization Type Medicare/AARP- G code needed   Authorization - Visit Number 7   Authorization - Number of Visits 10   OT Start Time 1107   OT Stop Time 1147   OT Time Calculation (min) 40 min   Activity Tolerance Patient tolerated treatment well      Past Medical History  Diagnosis Date  . Kidney stones   . CVA (cerebral infarction)   . Depression   . Hyperlipemia   . Hypertension   . Chicken pox   . Seasonal allergies     some asthma  . Measles as a child  . Mumps as a child  . Allergic state 12/29/2013  . Unspecified asthma(493.90) 12/29/2013  . GERD (gastroesophageal reflux disease)   . Arthritis   . IBS (irritable bowel syndrome)   . Otitis externa 12/10/2013    Past Surgical History  Procedure Laterality Date  . Lithotripsy      multiple times  . Wisdom tooth extraction    . Colonoscopy    . Tee without cardioversion N/A 09/22/2014    Procedure: TRANSESOPHAGEAL ECHOCARDIOGRAM (TEE);  Surgeon: Josue Hector, MD;  Location: [facility] ENDOSCOPY;  Service: Cardiovascular;  Laterality: N/A;    There were no vitals taken for this visit.  Visit Diagnosis:  Lack of coordination due to stroke  Muscle weakness of right arm  Abnormal sensation of upper extremity      Subjective Assessment - 10/28/14 1112    Symptoms Pt reports that he feels like he is getting a little more feeling, especially in the palm.  Unable to button sleeve button this morning.   Currently in Pain? No/denies                 OT  Treatments/Exercises (OP) - 10/28/14 0001    ADLs   Writing using foam grip, practiced writing sentences that incorporate whole alphabet with min difficulty, which increases with fatigue.   Fine Motor Coordination   In Hand Manipulation Training to flip coins and then manipulate to put in container, mod difficulty   O'Connor pegs place/remove with tweezers with min-mod cues/difficulty                  OT Short Term Goals - 10/25/14 1131    OT SHORT TERM GOAL #1   Title Pt will be independent with initial HEP.   Baseline due 11/06/14   Time 4   Period Weeks   Status Achieved   OT SHORT TERM GOAL #2   Title Pt will improve coordination for ADLs as shown by improving score on box and blocks test by at least 12 with RUE.   Baseline due 11/06/14, 37 blocks   Time 4   Period Weeks   Status New   OT SHORT TERM GOAL #3   Title Pt will reports using RUE as dominant UE for ADLs at least 75% of the time.   Baseline due 11/06/14, 50%   Time 4   Period Weeks   Status Achieved  75-90% of the time   OT SHORT TERM GOAL #4   Title Pt will be able to write with 100% legibility consistently.   Baseline due 11/06/14, not consistent   Time 4   Period Weeks   Status New           OT Long Term Goals - 10/11/14 1036    OT LONG TERM GOAL #1   Title Pt will be independent with updated HEP.   Baseline due 12/05/14   Time 8   Period Weeks   Status New   OT LONG TERM GOAL #2   Title Pt will improve coordination as shown by completing 9-hole peg test with R hand in less than 90sec.   Baseline unable,  due 12/05/14   Time 8   Period Weeks   Status New   OT LONG TERM GOAL #3   Title Pt will report using RUE as dominant UE at least 90% of the time for ADLs.   Baseline 50%, due 12/05/14   Time 8   Period Weeks   Status New   OT LONG TERM GOAL #4   Title Pt will complete fasteners independently.   Baseline due 12/05/14   Time 8   Period Weeks   Status New   OT LONG TERM GOAL #5    Title Pt will improve R grip strength by at least 12lbs to assist in opening containers/lifting objects.   Baseline 71lbs, due 12/05/14   Time 8   Period Weeks   Status New               Plan - 10/28/14 1115    Clinical Impression Statement Pt reports small improvement in sensation.  Pt continues to progress with coordination.  Encouraged pt to try watercolors.   Plan check STGs next week, coordination, sensory retraining        Problem List Patient Active Problem List   Diagnosis Date Noted  . DVT, lower extremity 10/04/2014  . Stroke 09/20/2014  . Barrett's esophagus 08/16/2014  . Lumbago 08/16/2014  . IBS (irritable bowel syndrome) 08/16/2014  . Abdominal pain 07/06/2014  . Calculus of kidney 06/28/2014  . Urgency of micturation 04/28/2014  . Allergic state 12/29/2013  . Unspecified asthma(493.90) 12/29/2013  . GERD (gastroesophageal reflux disease)   . Otitis externa 12/10/2013  . Need for prophylactic vaccination with combined diphtheria-tetanus-pertussis (DTP) vaccine 11/15/2013  . Encounter to establish care 11/15/2013  . Essential hypertension, benign 11/15/2013  . BPH (benign prostatic hyperplasia) 11/15/2013  . Recurrent nephrolithiasis 11/15/2013  . Depression with anxiety 11/15/2013  . Hyperlipidemia 11/15/2013  . Anosmia 11/15/2013  . History of CVA (cerebrovascular accident) 11/15/2013  . CMC arthritis, thumb, degenerative 11/04/2013    [facility], OTR/L 10/28/2014, 12:01 PM  [facility] [address] [place], [place], [address] Phone: [phone]   Fax:  [phone]

## 2014-10-29 DIAGNOSIS — N200 Calculus of kidney: Secondary | ICD-10-CM | POA: Diagnosis not present

## 2014-11-01 ENCOUNTER — Encounter: Payer: Self-pay | Admitting: Family Medicine

## 2014-11-01 ENCOUNTER — Ambulatory Visit (INDEPENDENT_AMBULATORY_CARE_PROVIDER_SITE_OTHER): Payer: Medicare Other | Admitting: Family Medicine

## 2014-11-01 VITALS — BP 114/80 | HR 77 | Temp 98.6°F | Ht 68.0 in | Wt 185.4 lb

## 2014-11-01 DIAGNOSIS — F418 Other specified anxiety disorders: Secondary | ICD-10-CM | POA: Diagnosis not present

## 2014-11-01 DIAGNOSIS — K219 Gastro-esophageal reflux disease without esophagitis: Secondary | ICD-10-CM

## 2014-11-01 DIAGNOSIS — I10 Essential (primary) hypertension: Secondary | ICD-10-CM

## 2014-11-01 DIAGNOSIS — I639 Cerebral infarction, unspecified: Secondary | ICD-10-CM | POA: Diagnosis not present

## 2014-11-01 MED ORDER — LOSARTAN POTASSIUM 100 MG PO TABS: 100.0000 mg | ORAL_TABLET | Freq: Every day | ORAL | Status: DC

## 2014-11-01 NOTE — Assessment & Plan Note (Signed)
He notes less depression with this stroke than the one he had in 2008 and he credits his Celexa with that. No changes to meds today

## 2014-11-01 NOTE — Assessment & Plan Note (Signed)
Avoid offending foods, start probiotics. Do not eat large meals in late evening and consider raising head of bed. Had stopped aspirin a month prior to stroke due to gastritis but no flare with Xarelto

## 2014-11-01 NOTE — Progress Notes (Signed)
Pre visit review using our clinic review tool, if applicable. No additional management support is needed unless otherwise documented below in the visit note. 

## 2014-11-01 NOTE — Patient Instructions (Signed)
Stroke Prevention Some medical conditions and behaviors are associated with an increased chance of having a stroke. You may prevent a stroke by making healthy choices and managing medical conditions. HOW CAN I REDUCE MY RISK OF HAVING A STROKE?   Stay physically active. Get at least 30 minutes of activity on most or all days.  Do not smoke. It may also be helpful to avoid exposure to secondhand smoke.  Limit alcohol use. Moderate alcohol use is considered to be:  No more than 2 drinks per day for men.  No more than 1 drink per day for nonpregnant women.  Eat healthy foods. This involves:  Eating 5 or more servings of fruits and vegetables a day.  Making dietary changes that address high blood pressure (hypertension), high cholesterol, diabetes, or obesity.  Manage your cholesterol levels.  Making food choices that are high in fiber and low in saturated fat, trans fat, and cholesterol may control cholesterol levels.  Take any prescribed medicines to control cholesterol as directed by your health care provider.  Manage your diabetes.  Controlling your carbohydrate and sugar intake is recommended to manage diabetes.  Take any prescribed medicines to control diabetes as directed by your health care provider.  Control your hypertension.  Making food choices that are low in salt (sodium), saturated fat, trans fat, and cholesterol is recommended to manage hypertension.  Take any prescribed medicines to control hypertension as directed by your health care provider.  Maintain a healthy weight.  Reducing calorie intake and making food choices that are low in sodium, saturated fat, trans fat, and cholesterol are recommended to manage weight.  Stop drug abuse.  Avoid taking birth control pills.  Talk to your health care provider about the risks of taking birth control pills if you are over 35 years old, smoke, get migraines, or have ever had a blood clot.  Get evaluated for sleep  disorders (sleep apnea).  Talk to your health care provider about getting a sleep evaluation if you snore a lot or have excessive sleepiness.  Take medicines only as directed by your health care provider.  For some people, aspirin or blood thinners (anticoagulants) are helpful in reducing the risk of forming abnormal blood clots that can lead to stroke. If you have the irregular heart rhythm of atrial fibrillation, you should be on a blood thinner unless there is a good reason you cannot take them.  Understand all your medicine instructions.  Make sure that other conditions (such as anemia or atherosclerosis) are addressed. SEEK IMMEDIATE MEDICAL CARE IF:   You have sudden weakness or numbness of the face, arm, or leg, especially on one side of the body.  Your face or eyelid droops to one side.  You have sudden confusion.  You have trouble speaking (aphasia) or understanding.  You have sudden trouble seeing in one or both eyes.  You have sudden trouble walking.  You have dizziness.  You have a loss of balance or coordination.  You have a sudden, severe headache with no known cause.  You have new chest pain or an irregular heartbeat. Any of these symptoms may represent a serious problem that is an emergency. Do not wait to see if the symptoms will go away. Get medical help at once. Call your local emergency services (911 in [place] Do not drive yourself to the hospital. Document Released: 10/11/2004 Document Revised: 01/18/2014 Document Reviewed: 03/06/2013 Exit[employer] Patient Information 2015 [employer]. This information is not intended to replace advice given   to you by your health care provider. Make sure you discuss any questions you have with your health care provider.  

## 2014-11-01 NOTE — Progress Notes (Signed)
Paul Drake  [id number] Sep 24, 1945 11/01/2014      Progress Note-Follow Up  Subjective  Chief Complaint  Chief Complaint  Patient presents with  . Follow-up    HPI  Patient is a 69 y.o. male in today for routine medical care. Today for follow-up on recent stroke. Was admitted to the hospital in early January with left-sided weakness and expressive aphasia and found to have a right posterior stroke. This is a recurrent episode and similar to the location of the stroke he had in 2008. Neither time did he have an injury or fall. Since coming home he has done well. Continues to have some decreased sensation in his hand and thus decrease tactile use. No loss of grip strength but he does have trouble with fine motor skills such as signing his name. This is improving. Denies CP/palp/SOB/HA/congestion/fevers/GI or GU c/o. Taking meds as prescribed  Past Medical History  Diagnosis Date  . Kidney stones   . CVA (cerebral infarction)   . Depression   . Hyperlipemia   . Hypertension   . Chicken pox   . Seasonal allergies     some asthma  . Measles as a child  . Mumps as a child  . Allergic state 12/29/2013  . Unspecified asthma(493.90) 12/29/2013  . GERD (gastroesophageal reflux disease)   . Arthritis   . IBS (irritable bowel syndrome)   . Otitis externa 12/10/2013    Past Surgical History  Procedure Laterality Date  . Lithotripsy      multiple times  . Wisdom tooth extraction    . Colonoscopy    . Tee without cardioversion N/A 09/22/2014    Procedure: TRANSESOPHAGEAL ECHOCARDIOGRAM (TEE);  Surgeon: Josue Hector, MD;  Location: [facility] ENDOSCOPY;  Service: Cardiovascular;  Laterality: N/A;    Family History  Problem Relation Age of Onset  . Hypertension Mother   . Hyperlipidemia Mother   . Fibromyalgia Mother   . Arthritis Mother     rheumatoid  . Diabetes Sister     type 2  . Hyperlipidemia Brother   . Hypertension Brother   . Ulcers Father 36    Bleeding Ulcers  .  Cancer Maternal Uncle     prostate  . Kidney Stones Daughter   . Asthma Daughter   . Healthy Son   . Cancer Maternal Grandfather     skin ?    History   Social History  . Marital Status: Married    Spouse Name: N/A  . Number of Children: N/A  . Years of Education: N/A   Occupational History  . Not on file.   Social History Main Topics  . Smoking status: Never Smoker   . Smokeless tobacco: Never Used  . Alcohol Use: 0.0 oz/week    0 Standard drinks or equivalent per week     Comment: 1-2 occasioinal  . Drug Use: No  . 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 Not on file   Social History Narrative    Current Outpatient Prescriptions on File Prior to Visit  Medication Sig Dispense Refill  . albuterol (PROVENTIL HFA;VENTOLIN HFA) 108 (90 BASE) MCG/ACT inhaler Inhale 2 puffs into the lungs every 6 (six) hours as needed for wheezing or shortness of breath. 1 Inhaler 3  . Cetirizine HCl (ZYRTEC ALLERGY PO) Take 10 mg by mouth daily.     . citalopram (CELEXA) 20 MG tablet  Take 20 mg by mouth daily.   2  . fluticasone (FLONASE) 50 MCG/ACT nasal spray Place 1 spray into both nostrils daily.     Marland Kitchen losartan (COZAAR) 100 MG tablet Take 100 mg by mouth daily.    . Multiple Vitamins-Minerals (MULTIVITAMINS THER. W/MINERALS) TABS tablet Take 1 tablet by mouth daily.    . Omega-3 Fatty Acids (OMEGA-3 FISH OIL) 1200 MG CAPS Take 1,200 mg by mouth daily.    Marland Kitchen omeprazole (PRILOSEC) 40 MG capsule Take 1 capsule (40 mg total) by mouth 2 (two) times daily. 180 capsule 3  . Potassium Citrate 15 MEQ (1620 MG) TBCR Take 1 tablet by mouth 2 (two) times daily.    Marland Kitchen RAPAFLO 8 MG CAPS capsule Take 8 mg by mouth daily with breakfast.   11  . Rivaroxaban (XARELTO STARTER PACK) 15 & 20 MG TBPK Take as directed on package: Start with one 15mg  tablet by mouth twice a day with food. On Day 22, switch to one 20mg  tablet  once a day. 51 each 0  . rivaroxaban (XARELTO) 20 MG TABS tablet Take one 20mg  tablets once a day starting from 10/23/14 for 5 months. 30 tablet 4  . simvastatin (ZOCOR) 20 MG tablet Take 1 tablet (20 mg total) by mouth daily. 30 tablet 6   No current facility-administered medications on file prior to visit.    No Known Allergies  Review of Systems  Review of Systems  Constitutional: Negative for fever and malaise/fatigue.  HENT: Negative for congestion.   Eyes: Negative for discharge.  Respiratory: Negative for shortness of breath.   Cardiovascular: Negative for chest pain, palpitations and leg swelling.  Gastrointestinal: Negative for nausea, abdominal pain and diarrhea.  Genitourinary: Negative for dysuria.  Musculoskeletal: Negative for falls.  Skin: Negative for rash.  Neurological: Positive for sensory change and focal weakness. Negative for seizures, loss of consciousness and headaches.  Endo/Heme/Allergies: Negative for polydipsia.  Psychiatric/Behavioral: Negative for depression and suicidal ideas. The patient is not nervous/anxious and does not have insomnia.     Objective  BP 114/80 mmHg  Pulse 77  Temp(Src) 98.6 F (37 C) (Oral)  Ht 5\' 8"  (1.727 m)  Wt 185 lb 6 oz (84.086 kg)  BMI 28.19 kg/m2  SpO2 98%  Physical Exam  Physical Exam  Constitutional: He is oriented to person, place, and time and well-developed, well-nourished, and in no distress. No distress.  HENT:  Head: Normocephalic and atraumatic.  Eyes: Conjunctivae are normal.  Neck: Neck supple. No thyromegaly present.  Cardiovascular: Normal rate, regular rhythm and normal heart sounds.   No murmur heard. Pulmonary/Chest: Effort normal and breath sounds normal. No respiratory distress.  Abdominal: He exhibits no distension and no mass. There is no tenderness.  Musculoskeletal: He exhibits no edema.  Neurological: He is alert and oriented to person, place, and time. Coordination abnormal.  Skin: Skin  is warm.  Psychiatric: Memory, affect and judgment normal.    Lab Results  Component Value Date   TSH 1.93 08/02/2014   Lab Results  Component Value Date   WBC 7.1 09/20/2014   HGB 13.6 09/20/2014   HCT 41.6 09/20/2014   MCV 97.4 09/20/2014   PLT 153 09/20/2014   Lab Results  Component Value Date   CREATININE 1.33 09/20/2014   BUN 16 09/20/2014   NA 141 09/20/2014   K 3.4* 09/20/2014   CL 106 09/20/2014   CO2 27 09/20/2014   Lab Results  Component Value Date   ALT  17 09/20/2014   AST 26 09/20/2014   ALKPHOS 74 09/20/2014   BILITOT 0.5 09/20/2014   Lab Results  Component Value Date   CHOL 154 09/21/2014   Lab Results  Component Value Date   HDL 49 09/21/2014   Lab Results  Component Value Date   LDLCALC 60 09/21/2014   Lab Results  Component Value Date   TRIG 226* 09/21/2014   Lab Results  Component Value Date   CHOLHDL 3.1 09/21/2014     Assessment & Plan

## 2014-11-01 NOTE — Assessment & Plan Note (Addendum)
Tolerating Xarelto, on 20 mg daily. No recent or new symptoms since returning home from hospital. Continues to struggle with decreased proprioception and use of his left hand.

## 2014-11-01 NOTE — Assessment & Plan Note (Signed)
Well controlled, no changes to meds. Encouraged heart healthy diet such as the DASH diet and exercise as tolerated.  °

## 2014-11-02 ENCOUNTER — Ambulatory Visit: Payer: Medicare Other | Admitting: Occupational Therapy

## 2014-11-02 DIAGNOSIS — I6980 Unspecified sequelae of other cerebrovascular disease: Secondary | ICD-10-CM | POA: Diagnosis not present

## 2014-11-02 DIAGNOSIS — IMO0002 Reserved for concepts with insufficient information to code with codable children: Secondary | ICD-10-CM

## 2014-11-02 DIAGNOSIS — R209 Unspecified disturbances of skin sensation: Secondary | ICD-10-CM | POA: Diagnosis not present

## 2014-11-02 DIAGNOSIS — R279 Unspecified lack of coordination: Secondary | ICD-10-CM | POA: Diagnosis not present

## 2014-11-02 DIAGNOSIS — M6281 Muscle weakness (generalized): Secondary | ICD-10-CM

## 2014-11-02 NOTE — Therapy (Signed)
[facility] [address] [place], [place], [address] Phone: [phone]   Fax:  [phone]  Occupational Therapy Treatment  Patient Details  Name: Paul Drake MRN: [id number] Date of Birth: Jan 21, 1946 Referring Provider:  Mosie Lukes, MD  Encounter Date: 11/02/2014      OT End of Session - 11/02/14 1321    Visit Number 8   Number of Visits 17   Date for OT Re-Evaluation 12/04/14   Authorization Type Medicare/AARP- G code needed   Authorization - Visit Number 8   Authorization - Number of Visits 10   OT Start Time 5056   OT Stop Time 1400   OT Time Calculation (min) 41 min   Activity Tolerance Patient tolerated treatment well      Past Medical History  Diagnosis Date  . Kidney stones   . CVA (cerebral infarction)   . Depression   . Hyperlipemia   . Hypertension   . Chicken pox   . Seasonal allergies     some asthma  . Measles as a child  . Mumps as a child  . Allergic state 12/29/2013  . Unspecified asthma(493.90) 12/29/2013  . GERD (gastroesophageal reflux disease)   . Arthritis   . IBS (irritable bowel syndrome)   . Otitis externa 12/10/2013    Past Surgical History  Procedure Laterality Date  . Lithotripsy      multiple times  . Wisdom tooth extraction    . Colonoscopy    . Tee without cardioversion N/A 09/22/2014    Procedure: TRANSESOPHAGEAL ECHOCARDIOGRAM (TEE);  Surgeon: Josue Hector, MD;  Location: [facility] ENDOSCOPY;  Service: Cardiovascular;  Laterality: N/A;    There were no vitals taken for this visit.  Visit Diagnosis:  Lack of coordination due to stroke  Muscle weakness of right arm      Subjective Assessment - 11/02/14 1320    Symptoms I feel like I am getting more sensitivity in the hand, but not the fingers.     Currently in Pain? No/denies                 OT Treatments/Exercises (OP) - 11/02/14 0001    ADLs   Overall ADLs Checked remaining STGs and 9-hole peg  test and discussed progress.   Fine Motor Coordination   In Hand Manipulation Training to rotate relaxation balls with min-mod difficulty   Purdue Pegboard completed with min-mod difficulty   Other Fine Motor Exercises Flipping card between each finger with mod-max difficulty   Sensation Exercises   Stereognosis Pulling objects from rice bath with eyes closed.  Pt able to distinguish larger (1inch objects) and identify properties/objects with eyes closed.  Pt unable to identify smaller objects.                  OT Short Term Goals - 11/02/14 1323    OT SHORT TERM GOAL #1   Title Pt will be independent with initial HEP.   Baseline due 11/06/14   Time 4   Period Weeks   Status Achieved   OT SHORT TERM GOAL #2   Title Pt will improve coordination for ADLs as shown by improving score on box and blocks test by at least 12 with RUE.   Baseline due 11/06/14, 37 blocks   Time 4   Period Weeks   Status On-going  11/02/14:  42 blocks   OT SHORT TERM GOAL #3   Title Pt will reports using RUE as dominant  UE for ADLs at least 75% of the time.   Baseline due 11/06/14, 50%   Time 4   Period Weeks   Status Achieved  75-90% of the time   OT SHORT TERM GOAL #4   Title Pt will be able to write with 100% legibility consistently.   Baseline due 11/06/14, not consistent   Time 4   Period Weeks   Status Achieved           OT Long Term Goals - 11/02/14 1340    OT LONG TERM GOAL #1   Title Pt will be independent with updated HEP.   Baseline due 12/05/14   Time 8   Period Weeks   Status New   OT LONG TERM GOAL #2   Title Pt will improve coordination as shown by completing 9-hole peg test with R hand in less than 90sec.   Baseline unable,  due 12/05/14   Time 8   Period Weeks   Status Achieved  11/02/14:  44.32sec   OT LONG TERM GOAL #3   Title Pt will report using RUE as dominant UE at least 90% of the time for ADLs.   Baseline 50%, due 12/05/14   Time 8   Period Weeks   Status  New   OT LONG TERM GOAL #4   Title Pt will complete fasteners independently.   Baseline due 12/05/14   Time 8   Period Weeks   Status New   OT LONG TERM GOAL #5   Title Pt will improve R grip strength by at least 12lbs to assist in opening containers/lifting objects.   Baseline 71lbs, due 12/05/14   Time 8   Period Weeks   Status New   Long Term Additional Goals   Additional Long Term Goals Yes   OT LONG TERM GOAL #6   Title Pt will improve coordination as shown by completing 9-hole peg test with R hand in less than 30sec.--due 12/05/14   Baseline eval:  unable, 11/02/14:  44.32sec    Status Revised               Plan - 11/02/14 1345    Clinical Impression Statement Pt progressing well with 3/4 STGs met, LTG#2 met and updated.  Sensation improved.   Plan coordination, functional reaching, sensory re-training        Problem List Patient Active Problem List   Diagnosis Date Noted  . DVT, lower extremity 10/04/2014  . Stroke 09/20/2014  . Barrett's esophagus 08/16/2014  . Lumbago 08/16/2014  . IBS (irritable bowel syndrome) 08/16/2014  . Abdominal pain 07/06/2014  . Calculus of kidney 06/28/2014  . Urgency of micturation 04/28/2014  . Allergic state 12/29/2013  . Unspecified asthma(493.90) 12/29/2013  . GERD (gastroesophageal reflux disease)   . Otitis externa 12/10/2013  . Need for prophylactic vaccination with combined diphtheria-tetanus-pertussis (DTP) vaccine 11/15/2013  . Encounter to establish care 11/15/2013  . Essential hypertension, benign 11/15/2013  . BPH (benign prostatic hyperplasia) 11/15/2013  . Recurrent nephrolithiasis 11/15/2013  . Depression with anxiety 11/15/2013  . Hyperlipidemia 11/15/2013  . Anosmia 11/15/2013  . History of CVA (cerebrovascular accident) 11/15/2013  . CMC arthritis, thumb, degenerative 11/04/2013    [facility], OTR/L  11/02/2014, 2:01 PM  [facility] [address] [place], [place], [address] Phone: [phone]   Fax:  [phone]

## 2014-11-04 ENCOUNTER — Encounter: Payer: Self-pay | Admitting: Occupational Therapy

## 2014-11-04 ENCOUNTER — Ambulatory Visit: Payer: Medicare Other | Admitting: Occupational Therapy

## 2014-11-04 DIAGNOSIS — R209 Unspecified disturbances of skin sensation: Secondary | ICD-10-CM | POA: Diagnosis not present

## 2014-11-04 DIAGNOSIS — M6281 Muscle weakness (generalized): Secondary | ICD-10-CM | POA: Diagnosis not present

## 2014-11-04 DIAGNOSIS — IMO0002 Reserved for concepts with insufficient information to code with codable children: Secondary | ICD-10-CM

## 2014-11-04 DIAGNOSIS — R279 Unspecified lack of coordination: Secondary | ICD-10-CM | POA: Diagnosis not present

## 2014-11-04 DIAGNOSIS — I6980 Unspecified sequelae of other cerebrovascular disease: Secondary | ICD-10-CM | POA: Diagnosis not present

## 2014-11-04 NOTE — Therapy (Signed)
[facility] [address] [place], [place], [address] Phone: [phone]   Fax:  [phone]  Occupational Therapy Treatment  Patient Details  Name: Paul Drake MRN: [id number] Date of Birth: May 07, 1946 Referring Provider:  Mosie Lukes, MD  Encounter Date: 11/04/2014      OT End of Session - 11/04/14 1110    Visit Number 9   Number of Visits 17   Date for OT Re-Evaluation 12/04/14   Authorization Type Medicare/AARP- G code needed   Authorization - Visit Number 9   Authorization - Number of Visits 10   OT Start Time 1104   OT Stop Time 1145   OT Time Calculation (min) 41 min   Activity Tolerance Patient tolerated treatment well      Past Medical History  Diagnosis Date  . Kidney stones   . CVA (cerebral infarction)   . Depression   . Hyperlipemia   . Hypertension   . Chicken pox   . Seasonal allergies     some asthma  . Measles as a child  . Mumps as a child  . Allergic state 12/29/2013  . Unspecified asthma(493.90) 12/29/2013  . GERD (gastroesophageal reflux disease)   . Arthritis   . IBS (irritable bowel syndrome)   . Otitis externa 12/10/2013    Past Surgical History  Procedure Laterality Date  . Lithotripsy      multiple times  . Wisdom tooth extraction    . Colonoscopy    . Tee without cardioversion N/A 09/22/2014    Procedure: TRANSESOPHAGEAL ECHOCARDIOGRAM (TEE);  Surgeon: Josue Hector, MD;  Location: [facility] ENDOSCOPY;  Service: Cardiovascular;  Laterality: N/A;    There were no vitals taken for this visit.  Visit Diagnosis:  Lack of coordination due to stroke  Muscle weakness of right arm  Abnormal sensation of upper extremity      Subjective Assessment - 11/04/14 1107    Symptoms Pt reports that he has been sketching with minimal difficulty.     Currently in Pain? No/denies                 OT Treatments/Exercises (OP) - 11/04/14 0001    Fine Motor Coordination   Grooved pegs with mod difficulty with in-hand manipulation   Sensation Exercises   Sensory Retraining Pulling pegs from green putty for increased L hand strength and sensory descrimination with min difficulty.  Pt unable to feel pegs with eyes closed.   Functional Reaching Activities   High Level to place small pegs in vertical pegboard to copy design for coordination with min difficulty/increased time with 1 drop   Modalities   Modalities Fluidotherapy   LUE Fluidotherapy   Number Minutes Fluidotherapy 9 Minutes   LUE Fluidotherapy Location Hand   Comments with no adverse reactions for sensory stimulation/improved flexibility                  OT Short Term Goals - 11/02/14 1323    OT SHORT TERM GOAL #1   Title Pt will be independent with initial HEP.   Baseline due 11/06/14   Time 4   Period Weeks   Status Achieved   OT SHORT TERM GOAL #2   Title Pt will improve coordination for ADLs as shown by improving score on box and blocks test by at least 12 with RUE.   Baseline due 11/06/14, 37 blocks   Time 4   Period Weeks   Status On-going  11/02/14:  42  blocks   OT SHORT TERM GOAL #3   Title Pt will reports using RUE as dominant UE for ADLs at least 75% of the time.   Baseline due 11/06/14, 50%   Time 4   Period Weeks   Status Achieved  75-90% of the time   OT SHORT TERM GOAL #4   Title Pt will be able to write with 100% legibility consistently.   Baseline due 11/06/14, not consistent   Time 4   Period Weeks   Status Achieved           OT Long Term Goals - 11/02/14 1340    OT LONG TERM GOAL #1   Title Pt will be independent with updated HEP.   Baseline due 12/05/14   Time 8   Period Weeks   Status New   OT LONG TERM GOAL #2   Title Pt will improve coordination as shown by completing 9-hole peg test with R hand in less than 90sec.   Baseline unable,  due 12/05/14   Time 8   Period Weeks   Status Achieved  11/02/14:  44.32sec   OT LONG TERM GOAL #3   Title Pt  will report using RUE as dominant UE at least 90% of the time for ADLs.   Baseline 50%, due 12/05/14   Time 8   Period Weeks   Status New   OT LONG TERM GOAL #4   Title Pt will complete fasteners independently.   Baseline due 12/05/14   Time 8   Period Weeks   Status New   OT LONG TERM GOAL #5   Title Pt will improve R grip strength by at least 12lbs to assist in opening containers/lifting objects.   Baseline 71lbs, due 12/05/14   Time 8   Period Weeks   Status New   Long Term Additional Goals   Additional Long Term Goals Yes   OT LONG TERM GOAL #6   Title Pt will improve coordination as shown by completing 9-hole peg test with R hand in less than 30sec.--due 12/05/14   Baseline eval:  unable, 11/02/14:  44.32sec    Status Revised               Plan - 11/04/14 1110    Clinical Impression Statement Pt continues to progress with coordination.   Plan G-code next session (using 9-hole peg test time from 11/02/14), sensory re-training, coordination        Problem List Patient Active Problem List   Diagnosis Date Noted  . DVT, lower extremity 10/04/2014  . Stroke 09/20/2014  . Barrett's esophagus 08/16/2014  . Lumbago 08/16/2014  . IBS (irritable bowel syndrome) 08/16/2014  . Abdominal pain 07/06/2014  . Calculus of kidney 06/28/2014  . Urgency of micturation 04/28/2014  . Allergic state 12/29/2013  . Unspecified asthma(493.90) 12/29/2013  . GERD (gastroesophageal reflux disease)   . Otitis externa 12/10/2013  . Need for prophylactic vaccination with combined diphtheria-tetanus-pertussis (DTP) vaccine 11/15/2013  . Encounter to establish care 11/15/2013  . Essential hypertension, benign 11/15/2013  . BPH (benign prostatic hyperplasia) 11/15/2013  . Recurrent nephrolithiasis 11/15/2013  . Depression with anxiety 11/15/2013  . Hyperlipidemia 11/15/2013  . Anosmia 11/15/2013  . History of CVA (cerebrovascular accident) 11/15/2013  . CMC arthritis, thumb, degenerative  11/04/2013    [facility] , OTR/L  11/04/2014, 11:47 AM  [place] [address] [place], [place], [address] Phone: [phone]   Fax:  [phone]

## 2014-11-09 ENCOUNTER — Ambulatory Visit: Payer: Medicare Other | Admitting: Occupational Therapy

## 2014-11-09 ENCOUNTER — Encounter: Payer: Self-pay | Admitting: Occupational Therapy

## 2014-11-09 DIAGNOSIS — M6281 Muscle weakness (generalized): Secondary | ICD-10-CM | POA: Diagnosis not present

## 2014-11-09 DIAGNOSIS — IMO0002 Reserved for concepts with insufficient information to code with codable children: Secondary | ICD-10-CM

## 2014-11-09 DIAGNOSIS — I6980 Unspecified sequelae of other cerebrovascular disease: Secondary | ICD-10-CM | POA: Diagnosis not present

## 2014-11-09 DIAGNOSIS — R209 Unspecified disturbances of skin sensation: Secondary | ICD-10-CM

## 2014-11-09 DIAGNOSIS — R279 Unspecified lack of coordination: Secondary | ICD-10-CM | POA: Diagnosis not present

## 2014-11-09 NOTE — Therapy (Signed)
[facility] [address] [place], [place], [address] Phone: [phone]   Fax:  [phone]  Occupational Therapy Treatment  Patient Details  Name: Paul Drake MRN: [id number] Date of Birth: 1946/07/05 Referring Provider:  Mosie Lukes, MD  Encounter Date: 11/09/2014      OT End of Session - 11/09/14 1116    Visit Number 10   Number of Visits 17   Date for OT Re-Evaluation 12/04/14   Authorization Type Medicare/AARP- G code needed   Authorization - Visit Number 10   Authorization - Number of Visits 20   OT Start Time 5681   OT Stop Time 1145   OT Time Calculation (min) 43 min   Activity Tolerance Patient tolerated treatment well      Past Medical History  Diagnosis Date  . Kidney stones   . CVA (cerebral infarction)   . Depression   . Hyperlipemia   . Hypertension   . Chicken pox   . Seasonal allergies     some asthma  . Measles as a child  . Mumps as a child  . Allergic state 12/29/2013  . Unspecified asthma(493.90) 12/29/2013  . GERD (gastroesophageal reflux disease)   . Arthritis   . IBS (irritable bowel syndrome)   . Otitis externa 12/10/2013    Past Surgical History  Procedure Laterality Date  . Lithotripsy      multiple times  . Wisdom tooth extraction    . Colonoscopy    . Tee without cardioversion N/A 09/22/2014    Procedure: TRANSESOPHAGEAL ECHOCARDIOGRAM (TEE);  Surgeon: Josue Hector, MD;  Location: [facility] ENDOSCOPY;  Service: Cardiovascular;  Laterality: N/A;    There were no vitals taken for this visit.  Visit Diagnosis:  Lack of coordination due to stroke  Abnormal sensation of upper extremity      Subjective Assessment - 11/09/14 1106    Symptoms Getting my wallet in/out is better.     Currently in Pain? No/denies                 OT Treatments/Exercises (OP) - 11/09/14 0001    Fine Motor Coordination   Purdue Pegboard completed with min difficulty   Other Fine  Motor Exercises Fastening nuts/bolts with L hand with L hand with vision occluded for coordination/challenge sensation with max difficulty   LUE Fluidotherapy   Number Minutes Fluidotherapy 9 Minutes   LUE Fluidotherapy Location Hand   Comments with no adverse reactions for sensory stimulation/improved flexibility due to pt c/o stiffness                  OT Short Term Goals - 11/02/14 1323    OT SHORT TERM GOAL #1   Title Pt will be independent with initial HEP.   Baseline due 11/06/14   Time 4   Period Weeks   Status Achieved   OT SHORT TERM GOAL #2   Title Pt will improve coordination for ADLs as shown by improving score on box and blocks test by at least 12 with RUE.   Baseline due 11/06/14, 37 blocks   Time 4   Period Weeks   Status On-going  11/02/14:  42 blocks   OT SHORT TERM GOAL #3   Title Pt will reports using RUE as dominant UE for ADLs at least 75% of the time.   Baseline due 11/06/14, 50%   Time 4   Period Weeks   Status Achieved  75-90% of the time  OT SHORT TERM GOAL #4   Title Pt will be able to write with 100% legibility consistently.   Baseline due 11/06/14, not consistent   Time 4   Period Weeks   Status Achieved           OT Long Term Goals - 11/02/14 1340    OT LONG TERM GOAL #1   Title Pt will be independent with updated HEP.   Baseline due 12/05/14   Time 8   Period Weeks   Status New   OT LONG TERM GOAL #2   Title Pt will improve coordination as shown by completing 9-hole peg test with R hand in less than 90sec.   Baseline unable,  due 12/05/14   Time 8   Period Weeks   Status Achieved  11/02/14:  44.32sec   OT LONG TERM GOAL #3   Title Pt will report using RUE as dominant UE at least 90% of the time for ADLs.   Baseline 50%, due 12/05/14   Time 8   Period Weeks   Status New   OT LONG TERM GOAL #4   Title Pt will complete fasteners independently.   Baseline due 12/05/14   Time 8   Period Weeks   Status New   OT LONG TERM GOAL  #5   Title Pt will improve R grip strength by at least 12lbs to assist in opening containers/lifting objects.   Baseline 71lbs, due 12/05/14   Time 8   Period Weeks   Status New   Long Term Additional Goals   Additional Long Term Goals Yes   OT LONG TERM GOAL #6   Title Pt will improve coordination as shown by completing 9-hole peg test with R hand in less than 30sec.--due 12/05/14   Baseline eval:  unable, 11/02/14:  44.32sec    Status Revised               Plan - November 24, 2014 1118    Clinical Impression Statement Pt progressing with coordination and reports improved LUE functional use   Plan sensory re-training,coordination          G-Codes - 11-24-14 1145    Functional Assessment Tool Used 9-hole peg test:  44.32sec   Functional Limitation Carrying, moving and handling objects   Carrying, Moving and Handling Objects Current Status (T9030) At least 20 percent but less than 40 percent impaired, limited or restricted   Carrying, Moving and Handling Objects Goal Status (S9233) At least 1 percent but less than 20 percent impaired, limited or restricted      Problem List Patient Active Problem List   Diagnosis Date Noted  . DVT, lower extremity 10/04/2014  . Stroke 09/20/2014  . Barrett's esophagus 08/16/2014  . Lumbago 08/16/2014  . IBS (irritable bowel syndrome) 08/16/2014  . Abdominal pain 07/06/2014  . Calculus of kidney 06/28/2014  . Urgency of micturation 04/28/2014  . Allergic state 12/29/2013  . Unspecified asthma(493.90) 12/29/2013  . GERD (gastroesophageal reflux disease)   . Otitis externa 12/10/2013  . Need for prophylactic vaccination with combined diphtheria-tetanus-pertussis (DTP) vaccine 11/15/2013  . Encounter to establish care 11/15/2013  . Essential hypertension, benign 11/15/2013  . BPH (benign prostatic hyperplasia) 11/15/2013  . Recurrent nephrolithiasis 11/15/2013  . Depression with anxiety 11/15/2013  . Hyperlipidemia 11/15/2013  . Anosmia  11/15/2013  . History of CVA (cerebrovascular accident) 11/15/2013  . CMC arthritis, thumb, degenerative 11/04/2013    [facility] November 24, 2014, 12:49 PM  [facility] [address]  [place], [place], [address] Phone: [phone]   Fax:  [place], OTR/L 11/09/2014 12:49 PM

## 2014-11-11 ENCOUNTER — Ambulatory Visit: Payer: Medicare Other | Admitting: Occupational Therapy

## 2014-11-11 DIAGNOSIS — I6980 Unspecified sequelae of other cerebrovascular disease: Secondary | ICD-10-CM | POA: Diagnosis not present

## 2014-11-11 DIAGNOSIS — R209 Unspecified disturbances of skin sensation: Secondary | ICD-10-CM | POA: Diagnosis not present

## 2014-11-11 DIAGNOSIS — R279 Unspecified lack of coordination: Secondary | ICD-10-CM | POA: Diagnosis not present

## 2014-11-11 DIAGNOSIS — IMO0002 Reserved for concepts with insufficient information to code with codable children: Secondary | ICD-10-CM

## 2014-11-11 DIAGNOSIS — M6281 Muscle weakness (generalized): Secondary | ICD-10-CM | POA: Diagnosis not present

## 2014-11-11 NOTE — Therapy (Signed)
[facility] [address] [place], [place], [address] Phone: [phone]   Fax:  [phone]  Occupational Therapy Treatment  Patient Details  Name: Paul Drake MRN: [id number] Date of Birth: 12/30/1945 Referring Provider:  Mosie Lukes, MD  Encounter Date: 11/11/2014      OT End of Session - 11/11/14 1205    Visit Number 11  G1   Number of Visits 17   Date for OT Re-Evaluation 12/04/14   Authorization Type Medicare/AARP- G code needed   Authorization - Visit Number 11   Authorization - Number of Visits 20   OT Start Time 9024   OT Stop Time 1150   OT Time Calculation (min) 45 min   Activity Tolerance Patient tolerated treatment well      Past Medical History  Diagnosis Date  . Kidney stones   . CVA (cerebral infarction)   . Depression   . Hyperlipemia   . Hypertension   . Chicken pox   . Seasonal allergies     some asthma  . Measles as a child  . Mumps as a child  . Allergic state 12/29/2013  . Unspecified asthma(493.90) 12/29/2013  . GERD (gastroesophageal reflux disease)   . Arthritis   . IBS (irritable bowel syndrome)   . Otitis externa 12/10/2013    Past Surgical History  Procedure Laterality Date  . Lithotripsy      multiple times  . Wisdom tooth extraction    . Colonoscopy    . Tee without cardioversion N/A 09/22/2014    Procedure: TRANSESOPHAGEAL ECHOCARDIOGRAM (TEE);  Surgeon: Josue Hector, MD;  Location: [facility] ENDOSCOPY;  Service: Cardiovascular;  Laterality: N/A;    There were no vitals taken for this visit.  Visit Diagnosis:  Lack of coordination due to stroke  Abnormal sensation of upper extremity      Subjective Assessment - 11/11/14 1134    Symptoms My hand doesn't hurt but it's miserable when I wake up b/c it's asleep feeling   Patient Stated Goals improve L hand, return to using watercolors   Currently in Pain? No/denies          [facility] OT Assessment - 11/11/14 0001     Sensation   Stereognosis Impaired Detail   Stereognosis Impaired Details Impaired LUE  id only 2/5 items w/ eyes occluded               OT Treatments/Exercises (OP) - 11/11/14 0001    Fine Motor Coordination   Other Fine Motor Exercises Pt practiced copying shapes tabletop for control Lt hand in prep for painting w/ min difficulty holding pen/marker with correct amount of pressure, however traced with considerable accuracy. Progressed to tracing lines/shapes on vertical surface demonstrating control and accuracy.    Other Fine Motor Exercises Gross motor: tossing ball Lt hand while ambulating with 75% or greater accuracy in catching ball Lt hand, however, when pt attempted to hold conversation, drops increased (50% accuracy). Pt dribbling ball for speed, timing and pressure w/ min difficulty. Pt reports this never fully recovered from 1st CVA approx. 8 years ago.    Sensation Exercises   Stereognosis Pt shown ways to improve sensory feedback: pulling various sized, shaped, and weighted objects from rice bin. Pt also shown exercise to control amt of pressure on object using slit cup.    Sensory Retraining Pt carrying cup of water while ambulating and holding conversation w/o drops/spills for proprioception Lt hand  OT Short Term Goals - 11/02/14 1323    OT SHORT TERM GOAL #1   Title Pt will be independent with initial HEP.   Baseline due 11/06/14   Time 4   Period Weeks   Status Achieved   OT SHORT TERM GOAL #2   Title Pt will improve coordination for ADLs as shown by improving score on box and blocks test by at least 12 with RUE.   Baseline due 11/06/14, 37 blocks   Time 4   Period Weeks   Status On-going  11/02/14:  42 blocks   OT SHORT TERM GOAL #3   Title Pt will reports using RUE as dominant UE for ADLs at least 75% of the time.   Baseline due 11/06/14, 50%   Time 4   Period Weeks   Status Achieved  75-90% of the time   OT SHORT TERM GOAL #4    Title Pt will be able to write with 100% legibility consistently.   Baseline due 11/06/14, not consistent   Time 4   Period Weeks   Status Achieved           OT Long Term Goals - 11/02/14 1340    OT LONG TERM GOAL #1   Title Pt will be independent with updated HEP.   Baseline due 12/05/14   Time 8   Period Weeks   Status New   OT LONG TERM GOAL #2   Title Pt will improve coordination as shown by completing 9-hole peg test with R hand in less than 90sec.   Baseline unable,  due 12/05/14   Time 8   Period Weeks   Status Achieved  11/02/14:  44.32sec   OT LONG TERM GOAL #3   Title Pt will report using RUE as dominant UE at least 90% of the time for ADLs.   Baseline 50%, due 12/05/14   Time 8   Period Weeks   Status New   OT LONG TERM GOAL #4   Title Pt will complete fasteners independently.   Baseline due 12/05/14   Time 8   Period Weeks   Status New   OT LONG TERM GOAL #5   Title Pt will improve R grip strength by at least 12lbs to assist in opening containers/lifting objects.   Baseline 71lbs, due 12/05/14   Time 8   Period Weeks   Status New   Long Term Additional Goals   Additional Long Term Goals Yes   OT LONG TERM GOAL #6   Title Pt will improve coordination as shown by completing 9-hole peg test with R hand in less than 30sec.--due 12/05/14   Baseline eval:  unable, 11/02/14:  44.32sec    Status Revised               Plan - 11/11/14 1205    Clinical Impression Statement Pt progressing with pre-writing and painting skills with increased control and accuracy. Sensory deficits (particularly stereognosis and proprioception) limit coordination tasks somewhat.    Plan practice free style writing, continue coordination and sensory re-training   Consulted and Agree with Plan of Care Patient        Problem List Patient Active Problem List   Diagnosis Date Noted  . DVT, lower extremity 10/04/2014  . Stroke 09/20/2014  . Barrett's esophagus 08/16/2014  .  Lumbago 08/16/2014  . IBS (irritable bowel syndrome) 08/16/2014  . Abdominal pain 07/06/2014  . Calculus of kidney 06/28/2014  . Urgency of micturation 04/28/2014  . Allergic  state 12/29/2013  . Unspecified asthma(493.90) 12/29/2013  . GERD (gastroesophageal reflux disease)   . Otitis externa 12/10/2013  . Need for prophylactic vaccination with combined diphtheria-tetanus-pertussis (DTP) vaccine 11/15/2013  . Encounter to establish care 11/15/2013  . Essential hypertension, benign 11/15/2013  . BPH (benign prostatic hyperplasia) 11/15/2013  . Recurrent nephrolithiasis 11/15/2013  . Depression with anxiety 11/15/2013  . Hyperlipidemia 11/15/2013  . Anosmia 11/15/2013  . History of CVA (cerebrovascular accident) 11/15/2013  . CMC arthritis, thumb, degenerative 11/04/2013    Carey Bullocks, OTR/L 11/11/2014, 12:08 PM  [facility] [address] [place], [place], [address] Phone: [phone]   Fax:  [phone]

## 2014-11-16 ENCOUNTER — Ambulatory Visit: Payer: Medicare Other | Admitting: Occupational Therapy

## 2014-11-18 ENCOUNTER — Ambulatory Visit: Payer: Medicare Other | Attending: Neurology | Admitting: Occupational Therapy

## 2014-11-18 ENCOUNTER — Encounter: Payer: Self-pay | Admitting: Occupational Therapy

## 2014-11-18 DIAGNOSIS — M6281 Muscle weakness (generalized): Secondary | ICD-10-CM | POA: Insufficient documentation

## 2014-11-18 DIAGNOSIS — R279 Unspecified lack of coordination: Secondary | ICD-10-CM | POA: Diagnosis not present

## 2014-11-18 DIAGNOSIS — IMO0002 Reserved for concepts with insufficient information to code with codable children: Secondary | ICD-10-CM

## 2014-11-18 DIAGNOSIS — R209 Unspecified disturbances of skin sensation: Secondary | ICD-10-CM | POA: Insufficient documentation

## 2014-11-18 DIAGNOSIS — I6980 Unspecified sequelae of other cerebrovascular disease: Secondary | ICD-10-CM | POA: Insufficient documentation

## 2014-11-18 NOTE — Therapy (Signed)
[facility] [address] [place], [place], [address] Phone: [phone]   Fax:  [phone]  Occupational Therapy Treatment  Patient Details  Name: Paul Drake MRN: [id number] Date of Birth: [birth year] Referring Provider:  Mosie Lukes, MD  Encounter Date: 11/18/2014      OT End of Session - 11/18/14 0943    Visit Number 12   Number of Visits 17   Date for OT Re-Evaluation 12/04/14   Authorization Type Medicare/AARP- G code needed   Authorization - Visit Number 12   Authorization - Number of Visits 20   OT Start Time 3846   OT Stop Time 1015   OT Time Calculation (min) 42 min   Activity Tolerance Patient tolerated treatment well      Past Medical History  Diagnosis Date  . Kidney stones   . CVA (cerebral infarction)   . Depression   . Hyperlipemia   . Hypertension   . Chicken pox   . Seasonal allergies     some asthma  . Measles as a child  . Mumps as a child  . Allergic state 12/29/2013  . Unspecified asthma(493.90) 12/29/2013  . GERD (gastroesophageal reflux disease)   . Arthritis   . IBS (irritable bowel syndrome)   . Otitis externa 12/10/2013    Past Surgical History  Procedure Laterality Date  . Lithotripsy      multiple times  . Wisdom tooth extraction    . Colonoscopy    . Tee without cardioversion N/A 09/22/2014    Procedure: TRANSESOPHAGEAL ECHOCARDIOGRAM (TEE);  Surgeon: Josue Hector, MD;  Location: [facility] ENDOSCOPY;  Service: Cardiovascular;  Laterality: N/A;    There were no vitals taken for this visit.  Visit Diagnosis:  Lack of coordination due to stroke  Muscle weakness of right arm  Abnormal sensation of upper extremity      Subjective Assessment - 11/18/14 0941    Symptoms "The hand is doing better."   Currently in Pain? No/denies                 OT Treatments/Exercises (OP) - 11/18/14 0001    Fine Motor Coordination   In Hand Manipulation Training to flip  cylinder pegs (various size) with min-mod diffiuclty and cues.   Grooved pegs --  tweezers-max difficulty, in-hand manipulation-mod difficulty   Other Fine Motor Exercises Picking up blocks using 55lbs sustained grip strength to stack blocks and unstack for coordination/sensory retraining force required and strengthening with min-mod difficulty   Other Fine Motor Exercises Manipulating dice in hand to targeted number with min difficulty.                  OT Short Term Goals - 11/18/14 6599    OT SHORT TERM GOAL #1   Title Pt will be independent with initial HEP.   Baseline due 11/06/14   Time 4   Period Weeks   Status Achieved   OT SHORT TERM GOAL #2   Title Pt will improve coordination for ADLs as shown by improving score on box and blocks test by at least 12 with LUE.   Baseline due 11/06/14, 37 blocks   Time 4   Period Weeks   Status On-going  11/02/14:  42 blocks   OT SHORT TERM GOAL #3   Title Pt will reports using LUE as dominant UE for ADLs at least 75% of the time.   Baseline due 11/06/14, 50%   Time 4  Period Weeks   Status Achieved  75-90% of the time   OT SHORT TERM GOAL #4   Title Pt will be able to write with 100% legibility consistently.   Baseline due 11/06/14, not consistent   Time 4   Period Weeks   Status Achieved           OT Long Term Goals - 11/18/14 2836    OT LONG TERM GOAL #1   Title Pt will be independent with updated HEP.   Baseline due 12/05/14   Time 8   Period Weeks   Status New   OT LONG TERM GOAL #2   Title Pt will improve coordination as shown by completing 9-hole peg test with L hand in less than 90sec.   Baseline unable,  due 12/05/14   Time 8   Period Weeks   Status Achieved  11/02/14:  44.32sec   OT LONG TERM GOAL #3   Title Pt will report using LUE as dominant UE at least 90% of the time for ADLs.   Baseline 50%, due 12/05/14   Time 8   Period Weeks   Status Achieved  90% or greater   OT LONG TERM GOAL #4   Title Pt  will complete fasteners independently.   Baseline due 12/05/14   Time 8   Period Weeks   Status Achieved  with increased time   OT LONG TERM GOAL #5   Title Pt will improve L grip strength by at least 12lbs to assist in opening containers/lifting objects.   Baseline 71lbs, due 12/05/14   Time 8   Period Weeks   Status New   OT LONG TERM GOAL #6   Title Pt will improve coordination as shown by completing 9-hole peg test with L hand in less than 30sec.--due 12/05/14   Baseline eval:  unable, 11/02/14:  44.32sec    Status Revised               Plan - 11/18/14 0959    Clinical Impression Statement Pt continues to progress well, but sensation deficits continue to be a barrier.   Plan continue with coordination, sensory re-training   Consulted and Agree with Plan of Care Patient        Problem List Patient Active Problem List   Diagnosis Date Noted  . DVT, lower extremity 10/04/2014  . Stroke 09/20/2014  . Barrett's esophagus 08/16/2014  . Lumbago 08/16/2014  . IBS (irritable bowel syndrome) 08/16/2014  . Abdominal pain 07/06/2014  . Calculus of kidney 06/28/2014  . Urgency of micturation 04/28/2014  . Allergic state 12/29/2013  . Unspecified asthma(493.90) 12/29/2013  . GERD (gastroesophageal reflux disease)   . Otitis externa 12/10/2013  . Need for prophylactic vaccination with combined diphtheria-tetanus-pertussis (DTP) vaccine 11/15/2013  . Encounter to establish care 11/15/2013  . Essential hypertension, benign 11/15/2013  . BPH (benign prostatic hyperplasia) 11/15/2013  . Recurrent nephrolithiasis 11/15/2013  . Depression with anxiety 11/15/2013  . Hyperlipidemia 11/15/2013  . Anosmia 11/15/2013  . History of CVA (cerebrovascular accident) 11/15/2013  . CMC arthritis, thumb, degenerative 11/04/2013    [facility] 11/18/2014, 10:16 AM  [facility] [address] [place], [place], [address] Phone:  [phone]   Fax:  [place], OTR/L 11/18/2014 10:16 AM

## 2014-11-22 ENCOUNTER — Encounter: Payer: Self-pay | Admitting: Occupational Therapy

## 2014-11-22 ENCOUNTER — Ambulatory Visit: Payer: Medicare Other | Admitting: Occupational Therapy

## 2014-11-22 DIAGNOSIS — R279 Unspecified lack of coordination: Secondary | ICD-10-CM | POA: Diagnosis not present

## 2014-11-22 DIAGNOSIS — M6281 Muscle weakness (generalized): Secondary | ICD-10-CM | POA: Diagnosis not present

## 2014-11-22 DIAGNOSIS — R209 Unspecified disturbances of skin sensation: Secondary | ICD-10-CM

## 2014-11-22 DIAGNOSIS — IMO0002 Reserved for concepts with insufficient information to code with codable children: Secondary | ICD-10-CM

## 2014-11-22 DIAGNOSIS — I6980 Unspecified sequelae of other cerebrovascular disease: Secondary | ICD-10-CM | POA: Diagnosis not present

## 2014-11-22 NOTE — Therapy (Signed)
[facility] [address] [place], [place], [address] Phone: [phone]   Fax:  [phone]  Occupational Therapy Treatment  Patient Details  Name: Paul Drake MRN: [id number] Date of Birth: 06/29/1946 Referring Provider:  Mosie Lukes, MD  Encounter Date: 11/22/2014      OT End of Session - 11/22/14 1329    Visit Number 13   Number of Visits 17   Date for OT Re-Evaluation 12/04/14   Authorization Type Medicare/AARP- G code needed   Authorization - Visit Number 13   Authorization - Number of Visits 20   OT Start Time 4580   OT Stop Time 1400   OT Time Calculation (min) 43 min   Activity Tolerance Patient tolerated treatment well      Past Medical History  Diagnosis Date  . Kidney stones   . CVA (cerebral infarction)   . Depression   . Hyperlipemia   . Hypertension   . Chicken pox   . Seasonal allergies     some asthma  . Measles as a child  . Mumps as a child  . Allergic state 12/29/2013  . Unspecified asthma(493.90) 12/29/2013  . GERD (gastroesophageal reflux disease)   . Arthritis   . IBS (irritable bowel syndrome)   . Otitis externa 12/10/2013    Past Surgical History  Procedure Laterality Date  . Lithotripsy      multiple times  . Wisdom tooth extraction    . Colonoscopy    . Tee without cardioversion N/A 09/22/2014    Procedure: TRANSESOPHAGEAL ECHOCARDIOGRAM (TEE);  Surgeon: Josue Hector, MD;  Location: [facility] ENDOSCOPY;  Service: Cardiovascular;  Laterality: N/A;    There were no vitals taken for this visit.  Visit Diagnosis:  Lack of coordination due to stroke  Abnormal sensation of upper extremity      Subjective Assessment - 11/22/14 1324    Symptoms Pt reports big change where he is feeling more than he was before  "It feels wierd"   Currently in Pain? No/denies                 OT Treatments/Exercises (OP) - 11/22/14 0001    Fine Motor Coordination   Other Fine  Motor Exercises Pulling pegs from beans for increased coordination and then reaching overhead to place in vertical pegboard with min difficulty for increased coordination and sensory retraining.  Removing 3 at a time and manipulating in hand with min-mod difficulty.   Other Fine Motor Exercises Threading beads on fishing line with min difficulty for increased coordination.  Tracing "o" of various sizes to stay between lines on vertical surface for increased fine motor control.   Sensation Exercises   Stereognosis Pt able to identify 1/5 objects, but able to identify some characteristics of objects.   Sensory Retraining Pulling items from rice bath with mod-max difficulty.  Pt able to distinguish more items from rice, but dropped frequently and continues to have difficulty with stereognosis.  Ambulating while holding plate with marbles with drops while rotating relaxation balls in R hand and performing visual scanning.                  OT Short Term Goals - 11/18/14 9983    OT SHORT TERM GOAL #1   Title Pt will be independent with initial HEP.   Baseline due 11/06/14   Time 4   Period Weeks   Status Achieved   OT SHORT TERM GOAL #2   Title  Pt will improve coordination for ADLs as shown by improving score on box and blocks test by at least 12 with LUE.   Baseline due 11/06/14, 37 blocks   Time 4   Period Weeks   Status On-going  11/02/14:  42 blocks   OT SHORT TERM GOAL #3   Title Pt will reports using LUE as dominant UE for ADLs at least 75% of the time.   Baseline due 11/06/14, 50%   Time 4   Period Weeks   Status Achieved  75-90% of the time   OT SHORT TERM GOAL #4   Title Pt will be able to write with 100% legibility consistently.   Baseline due 11/06/14, not consistent   Time 4   Period Weeks   Status Achieved           OT Long Term Goals - 11/18/14 8502    OT LONG TERM GOAL #1   Title Pt will be independent with updated HEP.   Baseline due 12/05/14   Time 8    Period Weeks   Status New   OT LONG TERM GOAL #2   Title Pt will improve coordination as shown by completing 9-hole peg test with L hand in less than 90sec.   Baseline unable,  due 12/05/14   Time 8   Period Weeks   Status Achieved  11/02/14:  44.32sec   OT LONG TERM GOAL #3   Title Pt will report using LUE as dominant UE at least 90% of the time for ADLs.   Baseline 50%, due 12/05/14   Time 8   Period Weeks   Status Achieved  90% or greater   OT LONG TERM GOAL #4   Title Pt will complete fasteners independently.   Baseline due 12/05/14   Time 8   Period Weeks   Status Achieved  with increased time   OT LONG TERM GOAL #5   Title Pt will improve L grip strength by at least 12lbs to assist in opening containers/lifting objects.   Baseline 71lbs, due 12/05/14   Time 8   Period Weeks   Status New   OT LONG TERM GOAL #6   Title Pt will improve coordination as shown by completing 9-hole peg test with L hand in less than 30sec.--due 12/05/14   Baseline eval:  unable, 11/02/14:  44.32sec    Status Revised               Plan - 11/22/14 1330    Clinical Impression Statement Pt reports improved sensation today with increased ease getting wallet out.   Plan continue with coordination, sensory re-training   Consulted and Agree with Plan of Care Patient        Problem List Patient Active Problem List   Diagnosis Date Noted  . DVT, lower extremity 10/04/2014  . Stroke 09/20/2014  . Barrett's esophagus 08/16/2014  . Lumbago 08/16/2014  . IBS (irritable bowel syndrome) 08/16/2014  . Abdominal pain 07/06/2014  . Calculus of kidney 06/28/2014  . Urgency of micturation 04/28/2014  . Allergic state 12/29/2013  . Unspecified asthma(493.90) 12/29/2013  . GERD (gastroesophageal reflux disease)   . Otitis externa 12/10/2013  . Need for prophylactic vaccination with combined diphtheria-tetanus-pertussis (DTP) vaccine 11/15/2013  . Encounter to establish care 11/15/2013  .  Essential hypertension, benign 11/15/2013  . BPH (benign prostatic hyperplasia) 11/15/2013  . Recurrent nephrolithiasis 11/15/2013  . Depression with anxiety 11/15/2013  . Hyperlipidemia 11/15/2013  . Anosmia 11/15/2013  . History  of CVA (cerebrovascular accident) 11/15/2013  . CMC arthritis, thumb, degenerative 11/04/2013    [facility] 11/22/2014, 2:16 PM  [facility] [address] [place], [place], [address] Phone: [phone]   Fax:  [place], OTR/L 11/22/2014 2:16 PM

## 2014-11-24 ENCOUNTER — Ambulatory Visit (INDEPENDENT_AMBULATORY_CARE_PROVIDER_SITE_OTHER): Payer: Medicare Other | Admitting: Neurology

## 2014-11-24 ENCOUNTER — Ambulatory Visit: Payer: Medicare Other | Admitting: Occupational Therapy

## 2014-11-24 ENCOUNTER — Encounter: Payer: Self-pay | Admitting: Neurology

## 2014-11-24 VITALS — BP 136/82 | HR 78 | Temp 97.6°F | Ht 68.0 in | Wt 180.0 lb

## 2014-11-24 DIAGNOSIS — Q2112 Patent foramen ovale: Secondary | ICD-10-CM

## 2014-11-24 DIAGNOSIS — R209 Unspecified disturbances of skin sensation: Secondary | ICD-10-CM

## 2014-11-24 DIAGNOSIS — I639 Cerebral infarction, unspecified: Secondary | ICD-10-CM

## 2014-11-24 DIAGNOSIS — Q211 Atrial septal defect: Secondary | ICD-10-CM | POA: Diagnosis not present

## 2014-11-24 DIAGNOSIS — IMO0002 Reserved for concepts with insufficient information to code with codable children: Secondary | ICD-10-CM

## 2014-11-24 DIAGNOSIS — R279 Unspecified lack of coordination: Secondary | ICD-10-CM | POA: Diagnosis not present

## 2014-11-24 DIAGNOSIS — I82401 Acute embolism and thrombosis of unspecified deep veins of right lower extremity: Secondary | ICD-10-CM | POA: Diagnosis not present

## 2014-11-24 DIAGNOSIS — I10 Essential (primary) hypertension: Secondary | ICD-10-CM | POA: Diagnosis not present

## 2014-11-24 DIAGNOSIS — I6980 Unspecified sequelae of other cerebrovascular disease: Secondary | ICD-10-CM | POA: Diagnosis not present

## 2014-11-24 DIAGNOSIS — M6281 Muscle weakness (generalized): Secondary | ICD-10-CM | POA: Diagnosis not present

## 2014-11-24 NOTE — Therapy (Signed)
[facility] [address] [place], [place], [address] Phone: [phone]   Fax:  [phone]  Occupational Therapy Treatment  Patient Details  Name: Paul Drake MRN: [id number] Date of Birth: [birth year] Referring Provider:  Mosie Lukes, MD  Encounter Date: 11/24/2014      OT End of Session - 11/24/14 1334    Visit Number 14   Number of Visits 17   Date for OT Re-Evaluation 12/04/14   Authorization Type Medicare/AARP- G code needed   Authorization - Visit Number 14   Authorization - Number of Visits 20   OT Start Time 1017   OT Stop Time 1100   OT Time Calculation (min) 43 min   Activity Tolerance Patient tolerated treatment well      Past Medical History  Diagnosis Date  . Kidney stones   . CVA (cerebral infarction)   . Depression   . Hyperlipemia   . Hypertension   . Chicken pox   . Seasonal allergies     some asthma  . Measles as a child  . Mumps as a child  . Allergic state 12/29/2013  . Unspecified asthma(493.90) 12/29/2013  . GERD (gastroesophageal reflux disease)   . Arthritis   . IBS (irritable bowel syndrome)   . Otitis externa 12/10/2013    Past Surgical History  Procedure Laterality Date  . Lithotripsy      multiple times  . Wisdom tooth extraction    . Colonoscopy    . Tee without cardioversion N/A 09/22/2014    Procedure: TRANSESOPHAGEAL ECHOCARDIOGRAM (TEE);  Surgeon: Josue Hector, MD;  Location: [facility] ENDOSCOPY;  Service: Cardiovascular;  Laterality: N/A;    There were no vitals taken for this visit.  Visit Diagnosis:  Lack of coordination due to stroke  Abnormal sensation of upper extremity      Subjective Assessment - 11/24/14 1044    Symptoms I don't have pain but just that wierd numbness in my Lt hand   Pertinent History Pt with hx of previous CVA with L dominant side weakness in 2008 with mild residual weaknessn/numbness in LLE and mild difficulty shooting/dribbling  basketball with LUE.  Current CVA with L-sided weakness and decreased L dominant hand coordination.   Patient Stated Goals improve L hand, return to using watercolors   Currently in Pain? No/denies                 OT Treatments/Exercises (OP) - 11/24/14 1045    Fine Motor Coordination   Other Fine Motor Exercises Pt tracing dotted line in Figure 8 motion w/o difficulty. Pt tracing just inside various shapes on tabletop surface with min difficulty and extra time. Pt occasionally compensating by developing strategy but instructed to perform in one motion without lifting pen.    Sensation Exercises   Sensory Retraining Pulling small items from putty including: small pegs, coins, marbles, and buttons, attempting task with eyes occluded w/ max difficulty and unable w/o assist from RT hand. Therefore, allowed pt to perform with eyes open but focusing on texture and size/shape of objects.                   OT Short Term Goals - 11/24/14 1335    OT SHORT TERM GOAL #1   Title Pt will be independent with initial HEP.   Baseline due 11/06/14   Time 4   Period Weeks   Status Achieved   OT SHORT TERM GOAL #2   Title Pt  will improve coordination for ADLs as shown by improving score on box and blocks test by at least 12 with LUE.   Baseline due 11/06/14, 37 blocks   Time 4   Period Weeks   Status Not Met  11/02/14:  42 blocks, 11/24/14 47 blocks   OT SHORT TERM GOAL #3   Title Pt will reports using LUE as dominant UE for ADLs at least 75% of the time.   Baseline due 11/06/14, 50%   Time 4   Period Weeks   Status Achieved  75-90% of the time   OT SHORT TERM GOAL #4   Title Pt will be able to write with 100% legibility consistently.   Baseline due 11/06/14, not consistent   Time 4   Period Weeks   Status Achieved           OT Long Term Goals - 11/24/14 1336    OT LONG TERM GOAL #1   Title Pt will be independent with updated HEP.   Baseline due 12/05/14   Time 8   Period  Weeks   Status Achieved   OT LONG TERM GOAL #2   Title Pt will improve coordination as shown by completing 9-hole peg test with L hand in less than 90sec.   Baseline unable,  due 12/05/14   Time 8   Period Weeks   Status Achieved  11/02/14:  44.32sec   OT LONG TERM GOAL #3   Title Pt will report using LUE as dominant UE at least 90% of the time for ADLs.   Baseline 50%, due 12/05/14   Time 8   Period Weeks   Status Achieved  90% or greater   OT LONG TERM GOAL #4   Title Pt will complete fasteners independently.   Baseline due 12/05/14   Time 8   Period Weeks   Status Achieved  with increased time   OT LONG TERM GOAL #5   Title Pt will improve L grip strength by at least 12lbs to assist in opening containers/lifting objects.   Baseline 71lbs, due 12/05/14   Time 8   Period Weeks   Status Not Met  Lt = 70 lbs   OT LONG TERM GOAL #6   Title Pt will improve coordination as shown by completing 9-hole peg test with L hand in less than 30sec.--due 12/05/14   Baseline eval:  unable, 11/02/14:  44.32sec    Status Revised               Plan - 11/24/14 1337    Clinical Impression Statement Pt still limited by sensory deficits, however coordination has improved. Assessed STG #2 and LTG #1, #5.    Plan D/C O.T. next week, assess LTG #6. (Pt reports MD said he could d/c today, but therapist and pt discussed wrapping up next session with primary therapist)        Problem List Patient Active Problem List   Diagnosis Date Noted  . PFO (patent foramen ovale) 11/24/2014  . Essential hypertension 11/24/2014  . DVT, lower extremity 10/04/2014  . Stroke 09/20/2014  . Barrett's esophagus 08/16/2014  . Lumbago 08/16/2014  . IBS (irritable bowel syndrome) 08/16/2014  . Abdominal pain 07/06/2014  . Calculus of kidney 06/28/2014  . Urgency of micturation 04/28/2014  . Allergic state 12/29/2013  . Unspecified asthma(493.90) 12/29/2013  . GERD (gastroesophageal reflux disease)   .  Otitis externa 12/10/2013  . Need for prophylactic vaccination with combined diphtheria-tetanus-pertussis (DTP) vaccine 11/15/2013  .  Encounter to establish care 11/15/2013  . Essential hypertension, benign 11/15/2013  . BPH (benign prostatic hyperplasia) 11/15/2013  . Recurrent nephrolithiasis 11/15/2013  . Depression with anxiety 11/15/2013  . Hyperlipidemia 11/15/2013  . Anosmia 11/15/2013  . History of CVA (cerebrovascular accident) 11/15/2013  . CMC arthritis, thumb, degenerative 11/04/2013    Carey Bullocks, OTR/L 11/24/2014, 1:41 PM  [facility] [address] [place], [place], [address] Phone: [phone]   Fax:  [phone]

## 2014-11-24 NOTE — Patient Instructions (Signed)
-   continue Xarelto and zocor for stroke prevention and DVT treatment - Follow up with your primary care physician for stroke risk factor modification. Recommend maintain blood pressure goal <130/80, diabetes with hemoglobin A1c goal below 6.5% and lipids with LDL cholesterol goal below 70 mg/dL.  - will do TCD bubble study and emboli monitoring - continue celexa - follow up in 4 months

## 2014-11-24 NOTE — Progress Notes (Signed)
STROKE NEUROLOGY FOLLOW UP NOTE  NAME: Paul Drake DOB: 1946/03/31  REASON FOR VISIT: stroke follow up HISTORY FROM: pt and chart  Today we had the pleasure of seeing Paul Drake in follow-up at our Neurology Clinic. Pt was accompanied by no one.   History Summary Paul Drake is a LH 69 y.o. male with a history of right MCA infarct in 2008 with residue LLE minimal weakness was admitted on 09/20/14 for acute onset left sided weakness and difficulty with speech.he received tPA. His acute right frontal stroke is adjacent to your previous stroke in 2008 shown in MRI. Stroke work up showed PFO with valsalva on TEE and positive right LE DVT. He was put on Xarelto 15mg  twice a day for 21 days and then 20mg  daily for 5 months. He mentioned that before this happened he also had some diarrhea and he might have done valsalva maneuvers then.   Patient's previous stroke was in 2008 and the patient has a resultant LLE numbness and weakness. Patient was on ASA but stopped it about a month ago due to gastritis.He denies N/V. However, shortly after the gastritis, he developed kidney stone. He may have a lot of pain which caused him to have valsalva maneuvers.   Interval History During the interval time, the patient has been doing well. No recurrent stroke like symptoms. He still has left hand numbness which causing trouble with writing and handling tools. He is on Xarelto daily. He followed up with his PCP closely. His BP today in clinic is 136/82. He still has OT twice a week but will soon be completed. His hypercoagulable work up in hospital was negative.   REVIEW OF SYSTEMS: Full 14 system review of systems performed and notable only for those listed below and in HPI above, all others are negative:  Constitutional:   Cardiovascular:  Ear/Nose/Throat:   Skin:  Eyes:   Respiratory:   Gastroitestinal:   Genitourinary:  Hematology/Lymphatic:   Endocrine:  Musculoskeletal: back pain, neck pain    Allergy/Immunology:  Env allergies Neurological:  Dizziness and numbness Psychiatric:  Sleep: frequent waking, sleep talking  The following represents the patient's updated allergies and side effects list: Allergies  Allergen Reactions  . Pollen Extract     Runny nose,itchy eyes  . Seasonal Ic [Cholestatin]     Itchy eyes and runny nose    The neurologically relevant items on the patient's problem list were reviewed on today's visit.  Neurologic Examination  A problem focused neurological exam (12 or more points of the single system neurologic examination, vital signs counts as 1 point, cranial nerves count for 8 points) was performed.  Blood pressure 136/82, pulse 78, temperature 97.6 F (36.4 C), temperature source Oral, height 5\' 8"  (1.727 m), weight 180 lb (81.647 kg).  General - Well nourished, well developed, in no apparent distress.  Ophthalmologic - Sharp disc margins OU.  Cardiovascular - Regular rate and rhythm with no murmur.  Mental Status -  Level of arousal and orientation to time, place, and person were intact. Language including expression, naming, repetition, comprehension, reading, and writing was assessed and found intact. Attention span and concentration were normal. Recent and remote memory were intact. Fund of Knowledge was assessed and was intact.  Cranial Nerves II - XII - II - Visual field intact OU. III, IV, VI - Extraocular movements intact. V - Facial sensation intact bilaterally. VII - Facial movement intact bilaterally. VIII - Hearing & vestibular intact bilaterally. X - Palate elevates  symmetrically. XI - Chin turning & shoulder shrug intact bilaterally. XII - Tongue protrusion intact.  Motor Strength - The patient's strength was normal in all extremities and pronator drift was absent.  Bulk was normal and fasciculations were absent.   Motor Tone - Muscle tone was assessed at the neck and appendages and was normal.  Reflexes - The  patient's reflexes were normal in all extremities and he had no pathological reflexes.  Sensory - Light touch, temperature/pinprick decreased on the left ulnar hand and all fingertips and Romberg testing were assessed and were normal.    Coordination - The patient had normal movements in the hands and feet with no ataxia or dysmetria.  Tremor was absent.  Gait and Station - The patient's transfers, posture, gait, station, and turns were observed as normal.  Data reviewed: I personally reviewed the images and agree with the radiology interpretations.  Ct Head (brain) Wo Contrast  09/20/2014 IMPRESSION: 1. No acute intracranial pathology seen on CT. 2. Chronic infarct at the high right parietal region, with associated encephalomalacia. 3. Mild cortical volume loss and scattered small vessel ischemic microangiopathy.   Carotid Doppler Bilateral: 1-39% ICA stenosis. Vertebral artery flow is antegrade.  2D Echocardiogram Pending  MRI HEAD Moderate size remote infarct posterior right frontal -parietal lobe with encephalomalacia. Small to moderate size acute infarct along the anterior margin of this remote infarct involving the posterior right frontal lobe extending to the posterior right operculum region. No intracranial hemorrhage.  MRA HEAD Decrease number of visualized right middle cerebral artery branch vessels consistent with patient's remote and acute infarct. No significant stenosis proximal to the branch vessel level. Ectatic vertebral arteries and basilar artery with slight irregularity of the distal left vertebral artery without significant stenosis. Nonvisualized right posterior inferior cerebellar artery. Mild posterior cerebral artery branch vessel irregularity and narrowing more notable on the right.  EKG normal EKG, normal sinus rhythm. For complete results please see formal report.  LE venous doppler - The right lower extremity is positive for deep vein  thrombosis involving the right gastrocnemius, posterior tibial, and peroneal veins. There is no evidence of left lower extremity deep vein thrombosis.  TEE -  1) PFO by bubble study not visualized Positive bubble study only with valsalva 2) Normal EF 60% 3) No LAA thrombus 4) Trivial MR 5) Mild AR 6) No aortic debris 7) Mild TR 8) Normal RV  Assessment: As you may recall, he is a 69 y.o. Caucasian male with PMH of right MCA CVA in 2008 was admitted on1/4/16 for again right MCA small stroke. The first stroke more at right motor strip and the last stroke more at sensory strip. Stroke w/u showed positive DVT and a likely small PFO with RLS only on valsalva maneuver. For the two strokes, it is likely pt had some valsalva maneuver prior to the strokes. He is on Xarelto now and question is whether we should keep him on it for questionable recurrent DVT and stroke. Will do TCD bubble and emboli detection. He dose not have much other traditional stroke risk factors.   Plan:  - continue Xarelto and zocor for stroke prevention and DVT treatment - Follow up with your primary care physician for stroke risk factor modification. Recommend maintain blood pressure goal <130/80, diabetes with hemoglobin A1c goal below 6.5% and lipids with LDL cholesterol goal below 70 mg/dL.  - will do TCD bubble study and emboli detection - RTC in 4 months to decide on anticoagulation use.  Orders  Placed This Encounter  Procedures  . [place] TCD WITHMONITORING    Standing Status: Future     Number of Occurrences:      Standing Expiration Date: 05/27/2015    Order Specific Question:  Reason for Exam (SYMPTOM  OR DIAGNOSIS REQUIRED)    Answer:  embolic stroke    Order Specific Question:  Preferred imaging location?    Answer:  Internal  . [place] TCD WITH BUBBLES    Standing Status: Future     Number of Occurrences:      Standing Expiration Date: 05/27/2015    Order Specific Question:  Reason for Exam (SYMPTOM  OR DIAGNOSIS  REQUIRED)    Answer:  DVT with possible PFO on TEE    Order Specific Question:  Preferred imaging location?    Answer:  Internal    No orders of the defined types were placed in this encounter.    Patient Instructions  - continue Xarelto and zocor for stroke prevention and DVT treatment - Follow up with your primary care physician for stroke risk factor modification. Recommend maintain blood pressure goal <130/80, diabetes with hemoglobin A1c goal below 6.5% and lipids with LDL cholesterol goal below 70 mg/dL.  - will do TCD bubble study and emboli monitoring - continue celexa - follow up in 4 months   Rosalin Hawking, MD PhD [facility] [address], [place], [place] [address] [phone]

## 2014-11-30 ENCOUNTER — Ambulatory Visit: Payer: Medicare Other | Admitting: Occupational Therapy

## 2014-11-30 ENCOUNTER — Encounter: Payer: Self-pay | Admitting: Occupational Therapy

## 2014-11-30 DIAGNOSIS — IMO0002 Reserved for concepts with insufficient information to code with codable children: Secondary | ICD-10-CM

## 2014-11-30 DIAGNOSIS — I6980 Unspecified sequelae of other cerebrovascular disease: Secondary | ICD-10-CM | POA: Diagnosis not present

## 2014-11-30 DIAGNOSIS — R279 Unspecified lack of coordination: Secondary | ICD-10-CM | POA: Diagnosis not present

## 2014-11-30 DIAGNOSIS — M6281 Muscle weakness (generalized): Secondary | ICD-10-CM | POA: Diagnosis not present

## 2014-11-30 DIAGNOSIS — R209 Unspecified disturbances of skin sensation: Secondary | ICD-10-CM | POA: Diagnosis not present

## 2014-11-30 NOTE — Therapy (Signed)
[facility] [address] [place], [place], [address] Phone: [phone]   Fax:  [phone]  Occupational Therapy Treatment  Patient Details  Name: Paul Drake MRN: [id number] Date of Birth: 1946/07/16 Referring Provider:  Mosie Lukes, MD  Encounter Date: 11/30/2014      OT End of Session - 11/30/14 1058    Visit Number 15   Number of Visits 17   Date for OT Re-Evaluation 12/04/14   Authorization Type Medicare/AARP- G code needed   Authorization - Visit Number 15   Authorization - Number of Visits 20   OT Start Time 1022   OT Stop Time 1102   OT Time Calculation (min) 40 min   Activity Tolerance Patient tolerated treatment well      Past Medical History  Diagnosis Date  . Kidney stones   . CVA (cerebral infarction)   . Depression   . Hyperlipemia   . Hypertension   . Chicken pox   . Seasonal allergies     some asthma  . Measles as a child  . Mumps as a child  . Allergic state 12/29/2013  . Unspecified asthma(493.90) 12/29/2013  . GERD (gastroesophageal reflux disease)   . Arthritis   . IBS (irritable bowel syndrome)   . Otitis externa 12/10/2013    Past Surgical History  Procedure Laterality Date  . Lithotripsy      multiple times  . Wisdom tooth extraction    . Colonoscopy    . Tee without cardioversion N/A 09/22/2014    Procedure: TRANSESOPHAGEAL ECHOCARDIOGRAM (TEE);  Surgeon: Josue Hector, MD;  Location: [facility] ENDOSCOPY;  Service: Cardiovascular;  Laterality: N/A;    There were no vitals filed for this visit.  Visit Diagnosis:  Lack of coordination due to stroke  Abnormal sensation of upper extremity  Muscle weakness of right arm      Subjective Assessment - 11/30/14 1030    Symptoms Pt reports that he is ready to d/c today and that he feels good with progress.   Pertinent History Pt with hx of previous CVA with L dominant side weakness in 2008 with mild residual weaknessn/numbness  in LLE and mild difficulty shooting/dribbling basketball with LUE.  Current CVA with L-sided weakness and decreased L dominant hand coordination.   Patient Stated Goals improve L hand, return to using watercolors   Currently in Pain? No/denies                    OT Treatments/Exercises (OP) - 11/30/14 0001    ADLs   Overall ADLs Discussed that manipulating items/pulling items (marker and golf tee) are good exercises to do.  Pt verbalized understanding.   ADL Comments Checked goals and discussed progress.  Pt completed FOTO with hand function score increasing from 30% at eval to 70% at d/c.   Fine Motor Coordination   O'Connor pegs place/remove with tweezers with min-mod cues/difficulty   Other Fine Motor Exercises Pulling bag into hand for coordination of both sides of hand with min v.c.   Other Fine Motor Exercises Flipping coins in hand to flip and them picking up 5 and manipulating in hand to place in coin bank one at a time to place in coin bank with min-mod difficulty/drops.                  OT Short Term Goals - 11/30/14 1027    OT SHORT TERM GOAL #1   Title Pt will be independent  with initial HEP.   Baseline due 11/06/14   Time 4   Period Weeks   Status Achieved   OT SHORT TERM GOAL #2   Title Pt will improve coordination for ADLs as shown by improving score on box and blocks test by at least 12 with LUE.   Baseline due 11/06/14, 37 blocks   Time 4   Period Weeks   Status Not Met  11/02/14:  42 blocks, 11/24/14 47 blocks, 2014-12-28:  45 blocks   OT SHORT TERM GOAL #3   Title Pt will reports using LUE as dominant UE for ADLs at least 75% of the time.   Baseline due 11/06/14, 50%   Time 4   Period Weeks   Status Achieved  75-90% of the time   OT SHORT TERM GOAL #4   Title Pt will be able to write with 100% legibility consistently.   Baseline due 11/06/14, not consistent   Time 4   Period Weeks   Status Achieved           OT Long Term Goals -  2014-12-28 1029    OT LONG TERM GOAL #1   Title Pt will be independent with updated HEP.   Baseline due 12/05/14   Time 8   Period Weeks   Status Achieved   OT LONG TERM GOAL #2   Title Pt will improve coordination as shown by completing 9-hole peg test with L hand in less than 90sec.   Baseline unable,  due 12/05/14   Time 8   Period Weeks   Status Achieved  11/02/14:  44.32sec   OT LONG TERM GOAL #3   Title Pt will report using LUE as dominant UE at least 90% of the time for ADLs.   Baseline 50%, due 12/05/14   Time 8   Period Weeks   Status Achieved  90% or greater   OT LONG TERM GOAL #4   Title Pt will complete fasteners independently.   Baseline due 12/05/14   Time 8   Period Weeks   Status Achieved  with increased time   OT LONG TERM GOAL #5   Title Pt will improve L grip strength by at least 12lbs to assist in opening containers/lifting objects.   Baseline 71lbs, due 12/05/14   Time 8   Period Weeks   Status Partially Met  Lt = 70 lbs; Dec 28, 2014:  66lbs without shelf liner, 94lbs with shelf liner   OT LONG TERM GOAL #6   Title Pt will improve coordination as shown by completing 9-hole peg test with L hand in less than 30sec.--due 12/05/14   Baseline eval:  unable, 11/02/14:  44.32sec    Status Not Met  12/28/14:  45.57sec               Plan - December 28, 2014 1058    Clinical Impression Statement Sensation deficits continue to limit L dominant hand functional use and affect functional strength and coordination.   Plan d/c OT today.     Consulted and Agree with Plan of Care Patient          G-Codes - 12/28/14 1339    Functional Assessment Tool Used 9-hole peg test:  45.57sec   Functional Limitation Carrying, moving and handling objects   Carrying, Moving and Handling Objects Goal Status (R4854) At least 1 percent but less than 20 percent impaired, limited or restricted   Carrying, Moving and Handling Objects Discharge Status (O2703) At least 20 percent but less than  40  percent impaired, limited or restricted      Problem List Patient Active Problem List   Diagnosis Date Noted  . PFO (patent foramen ovale) 11/24/2014  . Essential hypertension 11/24/2014  . DVT, lower extremity 10/04/2014  . Stroke 09/20/2014  . Barrett's esophagus 08/16/2014  . Lumbago 08/16/2014  . IBS (irritable bowel syndrome) 08/16/2014  . Abdominal pain 07/06/2014  . Calculus of kidney 06/28/2014  . Urgency of micturation 04/28/2014  . Allergic state 12/29/2013  . Unspecified asthma(493.90) 12/29/2013  . GERD (gastroesophageal reflux disease)   . Otitis externa 12/10/2013  . Need for prophylactic vaccination with combined diphtheria-tetanus-pertussis (DTP) vaccine 11/15/2013  . Encounter to establish care 11/15/2013  . Essential hypertension, benign 11/15/2013  . BPH (benign prostatic hyperplasia) 11/15/2013  . Recurrent nephrolithiasis 11/15/2013  . Depression with anxiety 11/15/2013  . Hyperlipidemia 11/15/2013  . Anosmia 11/15/2013  . History of CVA (cerebrovascular accident) 11/15/2013  . CMC arthritis, thumb, degenerative 11/04/2013    [facility] 11/30/2014, 1:40 PM  [facility] [address] [place], [place], [address] Phone: [phone]   Fax:  [place], OTR/L 11/30/2014 1:40 PM

## 2014-12-02 ENCOUNTER — Ambulatory Visit: Payer: Medicare Other | Admitting: Occupational Therapy

## 2014-12-07 DIAGNOSIS — D225 Melanocytic nevi of trunk: Secondary | ICD-10-CM | POA: Diagnosis not present

## 2014-12-07 DIAGNOSIS — D485 Neoplasm of uncertain behavior of skin: Secondary | ICD-10-CM | POA: Diagnosis not present

## 2014-12-07 DIAGNOSIS — L578 Other skin changes due to chronic exposure to nonionizing radiation: Secondary | ICD-10-CM | POA: Diagnosis not present

## 2014-12-07 DIAGNOSIS — L570 Actinic keratosis: Secondary | ICD-10-CM | POA: Diagnosis not present

## 2014-12-07 DIAGNOSIS — L821 Other seborrheic keratosis: Secondary | ICD-10-CM | POA: Diagnosis not present

## 2014-12-07 DIAGNOSIS — L820 Inflamed seborrheic keratosis: Secondary | ICD-10-CM | POA: Diagnosis not present

## 2014-12-15 ENCOUNTER — Ambulatory Visit (INDEPENDENT_AMBULATORY_CARE_PROVIDER_SITE_OTHER): Payer: Medicare Other | Admitting: Medical

## 2014-12-15 ENCOUNTER — Encounter: Payer: Self-pay | Admitting: Medical

## 2014-12-15 VITALS — BP 116/89 | HR 68 | Temp 98.1°F | Ht 68.0 in | Wt 179.2 lb

## 2014-12-15 DIAGNOSIS — I639 Cerebral infarction, unspecified: Secondary | ICD-10-CM | POA: Diagnosis not present

## 2014-12-15 DIAGNOSIS — J301 Allergic rhinitis due to pollen: Secondary | ICD-10-CM

## 2014-12-15 DIAGNOSIS — H6690 Otitis media, unspecified, unspecified ear: Secondary | ICD-10-CM | POA: Insufficient documentation

## 2014-12-15 DIAGNOSIS — R42 Dizziness and giddiness: Secondary | ICD-10-CM | POA: Diagnosis not present

## 2014-12-15 DIAGNOSIS — H6500 Acute serous otitis media, unspecified ear: Secondary | ICD-10-CM | POA: Diagnosis not present

## 2014-12-15 DIAGNOSIS — J309 Allergic rhinitis, unspecified: Secondary | ICD-10-CM | POA: Insufficient documentation

## 2014-12-15 MED ORDER — MECLIZINE HCL 12.5 MG PO TABS: 12.5000 mg | ORAL_TABLET | Freq: Three times a day (TID) | ORAL | Status: DC | PRN

## 2014-12-15 MED ORDER — CEFDINIR 300 MG PO CAPS: 300.0000 mg | ORAL_CAPSULE | Freq: Two times a day (BID) | ORAL | Status: DC

## 2014-12-15 MED ORDER — FLUTICASONE PROPIONATE 50 MCG/ACT NA SUSP
2.0000 | Freq: Every day | NASAL | Status: DC
Start: 2014-12-15 — End: 2015-10-14

## 2014-12-15 NOTE — Assessment & Plan Note (Addendum)
Ealy om likely related to allergic and mild chronic congestion. Rx cefdnir.  Could continue the ear drops as well.  Lt side and rt look both infected.

## 2014-12-15 NOTE — Patient Instructions (Addendum)
Allergic rhinitis Will refill flonase and you could continue zyrtec.   Otitis externa Ealy om likely related to allergic and mild chronic congestion. Rx azithromycin. Could continue the ear drops as well.  Lt side and rt look both infected.    Vertigo Rx of meclizine if worsens. With stroke history if worsening signs or symptoms associated with then ED evaluation.     Follow up in 7 days any persisting symptoms or as needed.

## 2014-12-15 NOTE — Assessment & Plan Note (Signed)
Rx of meclizine if worsens. With stroke history if worsening signs or symptoms associated with then ED evaluation.

## 2014-12-15 NOTE — Progress Notes (Signed)
Subjective:    Patient ID: Paul Drake, male    DOB: [birth year], 69 y.o.   MRN: [id number]  HPI  Pt in with some ear pain. Hx of allergies. Pt states low grade fever based on baseline normal temp 97 per his report. Now 98.1. Recent a lot of ear pain. Pt has been using old ear drops. Some dizziness recently and that started  fourth of January. Pt states he has vertigo since that last stroke.  Pt states chronic post nasal drainage all the time. Some nasal congestion. Some year round allergies. Pt states hx of ear infection/ problems twice in past 6 months. A lot of infections as youth.    Review of Systems  Constitutional: Positive for fever. Negative for chills and fatigue.       He reports some compared to his baseline temp.  HENT: Positive for congestion, ear pain, postnasal drip and rhinorrhea. Negative for sinus pressure and sore throat.   Respiratory: Negative for cough, choking, chest tightness and wheezing.   Cardiovascular: Negative for chest pain and palpitations.  Neurological: Positive for dizziness. Negative for seizures, syncope, facial asymmetry, speech difficulty, weakness, light-headedness, numbness and headaches.       Very frequent since his last stroke. Some worse recently.  Hematological: Negative for adenopathy. Does not bruise/bleed easily.   Past Medical History  Diagnosis Date  . Kidney stones   . CVA (cerebral infarction)   . Depression   . Hyperlipemia   . Hypertension   . Chicken pox   . Seasonal allergies     some asthma  . Measles as a child  . Mumps as a child  . Allergic state 12/29/2013  . Unspecified asthma(493.90) 12/29/2013  . GERD (gastroesophageal reflux disease)   . Arthritis   . IBS (irritable bowel syndrome)   . Otitis externa 12/10/2013    History   Social History  . Marital Status: Married    Spouse Name: Webb Silversmith  . Number of Children: 2  . Years of Education: college   Occupational History  .      retired   Social History  Main Topics  . Smoking status: Never Smoker   . Smokeless tobacco: Never Used  . Alcohol Use: 0.0 oz/week    0 Standard drinks or equivalent per week     Comment: 1-2 occasioinal  . Drug Use: No  . 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 Not on file   Social History Narrative   Patient consumes 2 cups of caffeine daily    Past Surgical History  Procedure Laterality Date  . Lithotripsy      multiple times  . Wisdom tooth extraction    . Colonoscopy    . Tee without cardioversion N/A 09/22/2014    Procedure: TRANSESOPHAGEAL ECHOCARDIOGRAM (TEE);  Surgeon: Josue Hector, MD;  Location: [facility] ENDOSCOPY;  Service: Cardiovascular;  Laterality: N/A;    Family History  Problem Relation Age of Onset  . Hypertension Mother   . Hyperlipidemia Mother   . Fibromyalgia Mother   . Arthritis Mother     rheumatoid  . Diabetes Sister     type 2  . Hyperlipidemia Brother   . Hypertension Brother   . Ulcers Father 36    Bleeding Ulcers  . Cancer Maternal Uncle     prostate  . Kidney Stones Daughter   . Asthma Daughter   .  Healthy Son   . Cancer Maternal Grandfather     skin ?    Allergies  Allergen Reactions  . Pollen Extract     Runny nose,itchy eyes  . Seasonal Ic [Cholestatin]     Itchy eyes and runny nose    Current Outpatient Prescriptions on File Prior to Visit  Medication Sig Dispense Refill  . albuterol (PROVENTIL HFA;VENTOLIN HFA) 108 (90 BASE) MCG/ACT inhaler Inhale 2 puffs into the lungs every 6 (six) hours as needed for wheezing or shortness of breath. 1 Inhaler 3  . Cetirizine HCl (ZYRTEC ALLERGY PO) Take 10 mg by mouth daily.     . citalopram (CELEXA) 20 MG tablet Take 20 mg by mouth daily.   2  . losartan (COZAAR) 100 MG tablet Take 1 tablet (100 mg total) by mouth daily. 90 tablet 3  . Multiple Vitamins-Minerals (MULTIVITAMINS THER. W/MINERALS) TABS tablet Take 1 tablet by mouth  daily.    . Omega-3 Fatty Acids (OMEGA-3 FISH OIL) 1200 MG CAPS Take 1,200 mg by mouth daily.    Marland Kitchen omeprazole (PRILOSEC) 40 MG capsule Take 1 capsule (40 mg total) by mouth 2 (two) times daily. 180 capsule 3  . Potassium Citrate 15 MEQ (1620 MG) TBCR Take 1 tablet by mouth 2 (two) times daily.    Marland Kitchen RAPAFLO 8 MG CAPS capsule Take 8 mg by mouth daily with breakfast.   11  . rivaroxaban (XARELTO) 20 MG TABS tablet Take one 20mg  tablets once a day starting from 10/23/14 for 5 months. 30 tablet 4  . rivaroxaban (XARELTO) 20 MG TABS tablet Take 20 mg by mouth.    . simvastatin (ZOCOR) 20 MG tablet Take 1 tablet (20 mg total) by mouth daily. 30 tablet 6   No current facility-administered medications on file prior to visit.    BP 116/89 mmHg  Pulse 68  Temp(Src) 98.1 F (36.7 C) (Oral)  Ht 5\' 8"  (1.727 m)  Wt 179 lb 3.2 oz (81.285 kg)  BMI 27.25 kg/m2  SpO2 100%       Objective:   Physical Exam  General  Mental Status - Alert. General Appearance - Well groomed. Not in acute distress.  Skin Rashes- No Rashes.  HEENT Head- Normal. Ear Auditory Canal - Left- Normal. Right - Normal.Tympanic Membrane- Left- upper 1/3 portion of time mild red Right- upper 1/3 mild red. Eye Sclera/Conjunctiva- Left- Normal. Right- Normal. Nose & Sinuses Nasal Mucosa- Left-  Boggy and Congested. Right-  Boggy and  Congested. no maxillary and no sinus pressure. Mouth & Throat Lips: Upper Lip- Normal: no dryness, cracking, pallor, cyanosis, or vesicular eruption. Lower Lip-Normal: no dryness, cracking, pallor, cyanosis or vesicular eruption. Buccal Mucosa- Bilateral- No Aphthous ulcers. Oropharynx- No Discharge or Erythema. +pnd Tonsils: Characteristics- Bilateral- No Erythema or Congestion. Size/Enlargement- Bilateral- No enlargement. Discharge- bilateral-None.  Neck Neck- Supple. No Masses.   Chest and Lung Exam Auscultation: Breath Sounds:-Clear even and  unlabored.  Cardiovascular Auscultation:Rythm- Regular, rate and rhythm. Murmurs & Other Heart Sounds:Ausculatation of the heart reveal- No Murmurs.  Lymphatic Head & Neck General Head & Neck Lymphatics: Bilateral: Description- No Localized lymphadenopathy.     Neurologic Cranial Nerve exam:- CN III-XII intact(No nystagmus), symmetric smile. Drift Test:- No drift. Romberg Exam:- Negative.  Finger to Nose:- Normal/Intact Strength:- 5/5 equal and symmetric strength both upper and lower extremities.       Assessment & Plan:

## 2014-12-15 NOTE — Progress Notes (Signed)
Pre visit review using our clinic review tool, if applicable. No additional management support is needed unless otherwise documented below in the visit note. 

## 2014-12-15 NOTE — Assessment & Plan Note (Signed)
Will refill flonase and you could continue zyrtec.

## 2014-12-29 ENCOUNTER — Ambulatory Visit (INDEPENDENT_AMBULATORY_CARE_PROVIDER_SITE_OTHER): Payer: Medicare Other

## 2014-12-29 DIAGNOSIS — I82401 Acute embolism and thrombosis of unspecified deep veins of right lower extremity: Secondary | ICD-10-CM

## 2014-12-29 DIAGNOSIS — Z0289 Encounter for other administrative examinations: Secondary | ICD-10-CM

## 2014-12-29 DIAGNOSIS — I639 Cerebral infarction, unspecified: Secondary | ICD-10-CM

## 2014-12-29 NOTE — Progress Notes (Unsigned)
  [facility] [address] [place]. [place] [address]. [phone]       TRANSCRANIAL DOPPLER BUBBLE STUDY   [name] Date of Birth:  08-13-1946 Medical Record Number:  [id number]   Indications: Diagnostic Date of Procedure: 12/29/2014 Clinical History:  73 year patient with stroke and PFO Technical Description:   Transcranial Doppler Bubble Study was performed at the bedside after taking written informed consent from the patient and explaining risk/benefits. Both middle cerebral arteries were insonated using a headset. And IV line was inserted in the left forearm by the RN using aseptic precautions. Agitated saline injection at rest and after valsalva maneuver did  result in high intensity transient signals (HITS) with a partial curtain sign after valsalva.   Impression:  Positive  Transcranial Doppler Bubble Study indicative of  Medium to large size right to left intracardiac shunt.   Results were explained to the patient. Questions were answered.I had a long discussion with the patient with regards to the role of patent foramen ovale   and risk of stroke. It is unclear at the present time whether PFO closure leads to better secondary stroke prevention or not. There are ongoing clinical trials which are trying to address this issue. I would recommend xarelto for now given history of DVT. May need to consider  Changing over to antiplatelet therapy after 6-9 months. The patient had a TEE on 09/22/14 which did not show right to left shunt this may be related to the fact that he may not have done an adequate Valsalva since on today's study  clearly the Valsalva maneuver resulted in a strongly positive study The patient also complained of residual left hand paresthesias and pain. I advised him to do exercises to improve fine motor skills. He may use when necessary analgesics. He declined trial of gabapentin not Topamax. He was advised to keep his scheduled follow-up visit  with Dr. Erlinda Hong

## 2014-12-31 ENCOUNTER — Ambulatory Visit (INDEPENDENT_AMBULATORY_CARE_PROVIDER_SITE_OTHER): Payer: Medicare Other | Admitting: Family Medicine

## 2014-12-31 ENCOUNTER — Encounter: Payer: Self-pay | Admitting: Family Medicine

## 2014-12-31 VITALS — BP 124/76 | HR 65 | Temp 98.3°F | Resp 16 | Ht 68.0 in | Wt 181.0 lb

## 2014-12-31 DIAGNOSIS — E785 Hyperlipidemia, unspecified: Secondary | ICD-10-CM

## 2014-12-31 DIAGNOSIS — R739 Hyperglycemia, unspecified: Secondary | ICD-10-CM

## 2014-12-31 DIAGNOSIS — E782 Mixed hyperlipidemia: Secondary | ICD-10-CM | POA: Diagnosis not present

## 2014-12-31 DIAGNOSIS — H6500 Acute serous otitis media, unspecified ear: Secondary | ICD-10-CM

## 2014-12-31 DIAGNOSIS — I639 Cerebral infarction, unspecified: Secondary | ICD-10-CM | POA: Diagnosis not present

## 2014-12-31 DIAGNOSIS — K219 Gastro-esophageal reflux disease without esophagitis: Secondary | ICD-10-CM

## 2014-12-31 DIAGNOSIS — I10 Essential (primary) hypertension: Secondary | ICD-10-CM | POA: Diagnosis not present

## 2014-12-31 LAB — CBC WITH DIFFERENTIAL/PLATELET
BASOS ABS: 0.1 10*3/uL (ref 0.0–0.1)
Basophils Relative: 0.9 % (ref 0.0–3.0)
EOS ABS: 0.3 10*3/uL (ref 0.0–0.7)
Eosinophils Relative: 5.1 % — ABNORMAL HIGH (ref 0.0–5.0)
HEMATOCRIT: 41.8 % (ref 39.0–52.0)
Hemoglobin: 14 g/dL (ref 13.0–17.0)
LYMPHS ABS: 1.4 10*3/uL (ref 0.7–4.0)
Lymphocytes Relative: 22.5 % (ref 12.0–46.0)
MCHC: 33.6 g/dL (ref 30.0–36.0)
MCV: 95.9 fl (ref 78.0–100.0)
MONO ABS: 0.5 10*3/uL (ref 0.1–1.0)
Monocytes Relative: 8.3 % (ref 3.0–12.0)
Neutro Abs: 4.1 10*3/uL (ref 1.4–7.7)
Neutrophils Relative %: 63.2 % (ref 43.0–77.0)
PLATELETS: 175 10*3/uL (ref 150.0–400.0)
RBC: 4.36 Mil/uL (ref 4.22–5.81)
RDW: 13 % (ref 11.5–15.5)
WBC: 6.4 10*3/uL (ref 4.0–10.5)

## 2014-12-31 LAB — LIPID PANEL
CHOL/HDL RATIO: 3
Cholesterol: 159 mg/dL (ref 0–200)
HDL: 52 mg/dL (ref >39.00)
NonHDL: 107
Triglycerides: 233 mg/dL — ABNORMAL HIGH (ref 0.0–149.0)
VLDL: 46.6 mg/dL — AB (ref 0.0–40.0)

## 2014-12-31 LAB — COMPREHENSIVE METABOLIC PANEL
ALBUMIN: 4.1 g/dL (ref 3.5–5.2)
ALK PHOS: 75 U/L (ref 39–117)
ALT: 17 U/L (ref 0–53)
AST: 23 U/L (ref 0–37)
BILIRUBIN TOTAL: 0.5 mg/dL (ref 0.2–1.2)
BUN: 14 mg/dL (ref 6–23)
CHLORIDE: 103 meq/L (ref 96–112)
CO2: 29 mEq/L (ref 19–32)
Calcium: 9.7 mg/dL (ref 8.4–10.5)
Creatinine, Ser: 1.09 mg/dL (ref 0.40–1.50)
GFR: 70.96 mL/min (ref >60.00)
GLUCOSE: 95 mg/dL (ref 70–99)
POTASSIUM: 4.3 meq/L (ref 3.5–5.1)
SODIUM: 138 meq/L (ref 135–145)
TOTAL PROTEIN: 7.2 g/dL (ref 6.0–8.3)

## 2014-12-31 LAB — LDL CHOLESTEROL, DIRECT: Direct LDL: 76 mg/dL

## 2014-12-31 LAB — TSH: TSH: 3.75 u[IU]/mL (ref 0.35–4.50)

## 2014-12-31 LAB — HEMOGLOBIN A1C: Hgb A1c MFr Bld: 5.5 % (ref 4.6–6.5)

## 2014-12-31 MED ORDER — MONTELUKAST SODIUM 10 MG PO TABS
10.0000 mg | ORAL_TABLET | Freq: Every evening | ORAL | Status: DC | PRN
Start: 2014-12-31 — End: 2016-10-10

## 2014-12-31 NOTE — Patient Instructions (Signed)
Rel of [facility], office notes recent TEE with bubble study.  Atrial Septal Defect An atrial septal defect (ASD) is a hole in the heart. This hole is located in the thin tissue (septum) that separates the two upper chambers of the heart, the right and left atrium. This hole is present at birth (congenital). A few minutes after birth, this hole normally closes so that blood is not able to go between the right and left atrium.  Normally, blood from the right side of the heart is pumped to the lungs where the blood is oxygenated. The oxygenated blood from the lungs is then pumped to the left side of the heart. From the left side of the heart, blood is pumped out to the rest of the body. When an ASD occurs, blood from the left atrium mixes with blood in the right atrium. The blood is then recirculated to the lungs and left side of the heart. In other words, the blood makes the trip twice. An ASD makes the heart work harder by increasing the amount of blood in the right side of the heart. This causes heart overload and eventually weakens the heart's ability to pump.  CAUSES  The cause of ASD is not known. SIGNS AND SYMPTOMS  The symptoms of ASD vary depending upon the size of the hole and the amount of blood that goes into the right atrium. There may be no symptoms or symptoms may include:  Tiredness or fatigue.  Trouble breathing or shortness of breath.  Irregular heartbeats (arrhythmias).  An extra "swishing" or "whooshing" type sound (heart murmur) heard when listening to the heart. DIAGNOSIS  In order to diagnose ASD, tests will need to be performed. Some of the tests may include:  ECG. This records the electrical activity of your heart and traces the patterns of your heartbeat.  Chest X-ray exam.  MRI or CT scan.  Nuclear medicine blood flow study. This imaging test shows how much blood is being passed through the ASD.  Echocardiography. There are two types that may be  used:  Transthoracic echocardiography (TTE). A TTE is very sensitive for detecting the two most common types of ASD, ostium primum or ostium secundum. It is not as sensitive in detecting a less common form of ASD, sinus venosus.  Transesophageal echocardiography (TEE). A TEE is especially helpful in patients who have a thin or easily movable (mobile) septum, making ASD detection more accurate.  Cardiac catheterization. In this procedure, a small tube (catheter) is passed through a large vein in your neck, groin, or arm. With this test, your health care provider can visualize your heart defect, check how well your heart is pumping, and check the function of your heart valves. TREATMENT   No treatment may be required if only a small amount of blood is moving back and forth (shunting) from the left to right atrium.  Minimally invasive closure may be done depending on the type and location of the ASD. Similar to the cardiac catheterization used to diagnose an ASD, this type of procedure is done in a cardiac catheterization lab. A catheter is inserted into a large blood vessel. The catheter is advanced to the ASD in the heart. A patch resembling an umbrella is threaded up the catheter and placed in the ASD hole. The patch is then "opened up" to close off the hole.  Open heart surgery may be necessary if minimally invasive closure cannot be done. If the ASD is small, the hole can be  closed with stitches. If the ASD is large, a patch is sewn over the defect so the hole is closed. SEEK IMMEDIATE MEDICAL CARE IF:  You experience unusual fatigue when exerting yourself.  You have chest pain at rest or with exertion.  You notice your fingertips or lips turning pale or blue. MAKE SURE YOU:   Understand these instructions.   Will watch your condition.  Will get help right away if you are not doing well or get worse.  Document Released: 04/17/2004 Document Revised: 01/18/2014 Document Reviewed:  05/11/2013 [facility] Patient Information 2015 [place], [place]. This information is not intended to replace advice given to you by your health care provider. Make sure you discuss any questions you have with your health care provider.

## 2014-12-31 NOTE — Progress Notes (Signed)
Pre visit review using our clinic review tool, if applicable. No additional management support is needed unless otherwise documented below in the visit note. 

## 2014-12-31 NOTE — Progress Notes (Signed)
Paul Drake  [id number] Dec 06, 1945 12/31/2014      Progress Note-Follow Up  Subjective  Chief Complaint  Chief Complaint  Patient presents with  . Follow-up    Here to f/u on ear infection    HPI  Patient is a 69 y.o. male in today for routine medical care. Patient is in today for follow-up on recent ear infection. Notes his ear is feeling much better. Continues to struggle with residual symptoms from his stroke but is improving daily. No new or acute complaints. No fevers or chills. Denies CP/palp/SOB/HA/congestion/fevers/GI or GU c/o. Taking meds as prescribed  Past Medical History  Diagnosis Date  . Kidney stones   . CVA (cerebral infarction)   . Depression   . Hyperlipemia   . Hypertension   . Chicken pox   . Seasonal allergies     some asthma  . Measles as a child  . Mumps as a child  . Allergic state 12/29/2013  . Unspecified asthma(493.90) 12/29/2013  . GERD (gastroesophageal reflux disease)   . Arthritis   . IBS (irritable bowel syndrome)   . Otitis externa 12/10/2013    Past Surgical History  Procedure Laterality Date  . Lithotripsy      multiple times  . Wisdom tooth extraction    . Colonoscopy    . Tee without cardioversion N/A 09/22/2014    Procedure: TRANSESOPHAGEAL ECHOCARDIOGRAM (TEE);  Surgeon: Josue Hector, MD;  Location: [facility] ENDOSCOPY;  Service: Cardiovascular;  Laterality: N/A;    Family History  Problem Relation Age of Onset  . Hypertension Mother   . Hyperlipidemia Mother   . Fibromyalgia Mother   . Arthritis Mother     rheumatoid  . Diabetes Sister     type 2  . Hyperlipidemia Brother   . Hypertension Brother   . Ulcers Father 36    Bleeding Ulcers  . Cancer Maternal Uncle     prostate  . Kidney Stones Daughter   . Asthma Daughter   . Healthy Son   . Cancer Maternal Grandfather     skin ?    History   Social History  . Marital Status: Married    Spouse Name: Webb Silversmith  . Number of Children: 2  . Years of Education: college     Occupational History  .      retired   Social History Main Topics  . Smoking status: Never Smoker   . Smokeless tobacco: Never Used  . Alcohol Use: 0.0 oz/week    0 Standard drinks or equivalent per week     Comment: 1-2 occasioinal  . Drug Use: No  . 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 Not on file   Social History Narrative   Patient consumes 2 cups of caffeine daily    Current Outpatient Prescriptions on File Prior to Visit  Medication Sig Dispense Refill  . albuterol (PROVENTIL HFA;VENTOLIN HFA) 108 (90 BASE) MCG/ACT inhaler Inhale 2 puffs into the lungs every 6 (six) hours as needed for wheezing or shortness of breath. 1 Inhaler 3  . Cetirizine HCl (ZYRTEC ALLERGY PO) Take 10 mg by mouth daily.     . citalopram (CELEXA) 20 MG tablet Take 20 mg by mouth daily.   2  . fluticasone (FLONASE) 50 MCG/ACT nasal spray Place 2 sprays into both nostrils daily. 16 g 3  . losartan (COZAAR) 100 MG tablet Take 1  tablet (100 mg total) by mouth daily. 90 tablet 3  . meclizine (ANTIVERT) 12.5 MG tablet Take 1 tablet (12.5 mg total) by mouth 3 (three) times daily as needed for dizziness. 30 tablet 0  . Multiple Vitamins-Minerals (MULTIVITAMINS THER. W/MINERALS) TABS tablet Take 1 tablet by mouth daily.    . Omega-3 Fatty Acids (OMEGA-3 FISH OIL) 1200 MG CAPS Take 1,200 mg by mouth daily.    Marland Kitchen omeprazole (PRILOSEC) 40 MG capsule Take 1 capsule (40 mg total) by mouth 2 (two) times daily. 180 capsule 3  . Potassium Citrate 15 MEQ (1620 MG) TBCR Take 1 tablet by mouth 2 (two) times daily.    Marland Kitchen RAPAFLO 8 MG CAPS capsule Take 8 mg by mouth daily with breakfast.   11  . rivaroxaban (XARELTO) 20 MG TABS tablet Take one 20mg  tablets once a day starting from 10/23/14 for 5 months. 30 tablet 4  . simvastatin (ZOCOR) 20 MG tablet Take 1 tablet (20 mg total) by mouth daily. 30 tablet 6   No current  facility-administered medications on file prior to visit.    Allergies  Allergen Reactions  . Pollen Extract     Runny nose,itchy eyes  . Seasonal Ic [Cholestatin]     Itchy eyes and runny nose    Review of Systems  Review of Systems  Constitutional: Negative for fever and malaise/fatigue.  HENT: Positive for congestion.   Eyes: Negative for discharge.  Respiratory: Negative for shortness of breath.   Cardiovascular: Negative for chest pain, palpitations and leg swelling.  Gastrointestinal: Negative for nausea, abdominal pain and diarrhea.  Genitourinary: Negative for dysuria.  Musculoskeletal: Negative for falls.  Skin: Negative for rash.  Neurological: Positive for focal weakness. Negative for loss of consciousness and headaches.  Endo/Heme/Allergies: Negative for polydipsia.  Psychiatric/Behavioral: Negative for depression and suicidal ideas. The patient is not nervous/anxious and does not have insomnia.     Objective  BP 124/76 mmHg  Pulse 65  Temp(Src) 98.3 F (36.8 C) (Oral)  Resp 16  Ht 5\' 8"  (1.727 m)  Wt 181 lb (82.101 kg)  BMI 27.53 kg/m2  SpO2 98%  Physical Exam  Physical Exam  Constitutional: He is oriented to person, place, and time and well-developed, well-nourished, and in no distress. No distress.  HENT:  Head: Normocephalic and atraumatic.  TMs slightly dull and red, nasal mucosa erythematous and swollen. oropharynyx not swollen not red  Eyes: Conjunctivae are normal.  Neck: Neck supple. No thyromegaly present.  Cardiovascular: Normal rate, regular rhythm and normal heart sounds.   No murmur heard. Pulmonary/Chest: Effort normal and breath sounds normal. No respiratory distress.  Abdominal: He exhibits no distension and no mass. There is no tenderness.  Musculoskeletal: He exhibits no edema.  Neurological: He is alert and oriented to person, place, and time.  Skin: Skin is warm.  Psychiatric: Memory, affect and judgment normal.    Lab  Results  Component Value Date   TSH 1.93 08/02/2014   Lab Results  Component Value Date   WBC 7.1 09/20/2014   HGB 13.6 09/20/2014   HCT 41.6 09/20/2014   MCV 97.4 09/20/2014   PLT 153 09/20/2014   Lab Results  Component Value Date   CREATININE 1.33 09/20/2014   BUN 16 09/20/2014   NA 141 09/20/2014   K 3.4* 09/20/2014   CL 106 09/20/2014   CO2 27 09/20/2014   Lab Results  Component Value Date   ALT 17 09/20/2014   AST 26 09/20/2014  ALKPHOS 74 09/20/2014   BILITOT 0.5 09/20/2014   Lab Results  Component Value Date   CHOL 154 09/21/2014   Lab Results  Component Value Date   HDL 49 09/21/2014   Lab Results  Component Value Date   LDLCALC 60 09/21/2014   Lab Results  Component Value Date   TRIG 226* 09/21/2014   Lab Results  Component Value Date   CHOLHDL 3.1 09/21/2014     Assessment & Plan  Essential hypertension, benign Well controlled, no changes to meds. Encouraged heart healthy diet such as the DASH diet and exercise as tolerated.    GERD (gastroesophageal reflux disease) Avoid offending foods, start probiotics. Do not eat large meals in late evening and consider raising head of bed.    Hyperlipidemia Tolerating statin, encouraged heart healthy diet, avoid trans fats, minimize simple carbs and saturated fats. Increase exercise as tolerated   Hyperglycemia hgba1c acceptable, minimize simple carbs. Increase exercise as tolerated.    Otitis media Improved after treatment

## 2015-01-03 ENCOUNTER — Telehealth: Payer: Self-pay | Admitting: Neurology

## 2015-01-03 NOTE — Telephone Encounter (Signed)
Pt is calling stating he has questions regarding the hole in his heart.  He said he has active kidney stones and he wants to know if he should stay on rivaroxaban (XARELTO) 20 MG TABS tablet.  Please call and advise.

## 2015-01-03 NOTE — Telephone Encounter (Signed)
Pt calling with multiple questions.  He has active kidney stone  (4) now and is on the xarelto for stroke.  Has hole in heart.  Asking about repair of hole in heart, to decrease stroke risk. Questions about kidney stones, (procedures) to break up stone.  Please call and discuss.

## 2015-01-03 NOTE — Telephone Encounter (Signed)
Discussed over the phone with patient. Answered his questions regarding Xarelto and kidney stone procedures. I told him that the best timing for kidney stone procedure may be in July when his treatment for DVT has been 6 months. At that time, we can hold off anticoagulation, and perform the procedure, and then decide on whether continue anticoagulation or hold off. There is no rule for PFO closure at this time. Patient expressed understanding and appreciation.  Rosalin Hawking, MD PhD Stroke Neurology 01/03/2015 5:20 PM

## 2015-01-06 ENCOUNTER — Encounter: Payer: Self-pay | Admitting: Family Medicine

## 2015-01-06 DIAGNOSIS — R739 Hyperglycemia, unspecified: Secondary | ICD-10-CM | POA: Insufficient documentation

## 2015-01-06 HISTORY — DX: Hyperglycemia, unspecified: R73.9

## 2015-01-06 NOTE — Assessment & Plan Note (Signed)
Well controlled, no changes to meds. Encouraged heart healthy diet such as the DASH diet and exercise as tolerated.  °

## 2015-01-06 NOTE — Assessment & Plan Note (Signed)
hgba1c acceptable, minimize simple carbs. Increase exercise as tolerated.  

## 2015-01-06 NOTE — Assessment & Plan Note (Signed)
Tolerating statin, encouraged heart healthy diet, avoid trans fats, minimize simple carbs and saturated fats. Increase exercise as tolerated 

## 2015-01-06 NOTE — Assessment & Plan Note (Signed)
Avoid offending foods, start probiotics. Do not eat large meals in late evening and consider raising head of bed.  

## 2015-01-06 NOTE — Assessment & Plan Note (Signed)
Improved after treatment

## 2015-01-12 ENCOUNTER — Telehealth: Payer: Self-pay | Admitting: Family Medicine

## 2015-01-12 MED ORDER — CITALOPRAM HYDROBROMIDE 20 MG PO TABS: 20.0000 mg | ORAL_TABLET | Freq: Every day | ORAL | Status: DC

## 2015-01-12 NOTE — Telephone Encounter (Signed)
Caller name: [place] Relation to pt: self Call back number: [phone] Pharmacy: [facility] at [address]  Reason for call:   Requesting citalopram refill. Patient is out.

## 2015-01-12 NOTE — Telephone Encounter (Signed)
Rx sent to [facility].  Patient notified.

## 2015-01-20 DIAGNOSIS — R110 Nausea: Secondary | ICD-10-CM | POA: Diagnosis not present

## 2015-01-20 DIAGNOSIS — M549 Dorsalgia, unspecified: Secondary | ICD-10-CM | POA: Diagnosis not present

## 2015-01-20 DIAGNOSIS — R319 Hematuria, unspecified: Secondary | ICD-10-CM | POA: Diagnosis not present

## 2015-01-20 DIAGNOSIS — R109 Unspecified abdominal pain: Secondary | ICD-10-CM | POA: Diagnosis not present

## 2015-01-20 DIAGNOSIS — N201 Calculus of ureter: Secondary | ICD-10-CM | POA: Diagnosis not present

## 2015-01-25 DIAGNOSIS — N201 Calculus of ureter: Secondary | ICD-10-CM | POA: Diagnosis not present

## 2015-01-25 DIAGNOSIS — N200 Calculus of kidney: Secondary | ICD-10-CM | POA: Diagnosis not present

## 2015-02-08 DIAGNOSIS — N200 Calculus of kidney: Secondary | ICD-10-CM | POA: Diagnosis not present

## 2015-03-01 ENCOUNTER — Telehealth: Payer: Self-pay | Admitting: Neurology

## 2015-03-01 DIAGNOSIS — N200 Calculus of kidney: Secondary | ICD-10-CM | POA: Diagnosis not present

## 2015-03-01 NOTE — Telephone Encounter (Signed)
Patient called stating he is needing to have kidney stones removed. Dr Estill Dooms is going to call to talk with Dr Erlinda Hong regarding rivaroxaban (XARELTO) 20 MG TABS tablet . Surgery is scheduled for 02/28/15. Patient states he has a long history of kidney stones.

## 2015-03-02 NOTE — Telephone Encounter (Signed)
Agree with plan 

## 2015-03-02 NOTE — Telephone Encounter (Signed)
Spoke with [facility] in scheduling at Dr Lorinda Creed office. She states patient is not scheduled for surgery. He did have KUB yesterday. Holly states per Dr Lorinda Creed note, he is going to avoid surgery if possible unless patient begins experiencing a lot of pain. Informed [name] Dr Erlinda Hong is out of country until July; his partner is in hospital but available if necessary to speak with Dr Estill Dooms in Dr Phoebe Sharps absence. Holly verbalized understanding.

## 2015-03-11 ENCOUNTER — Encounter: Payer: Self-pay | Admitting: Family Medicine

## 2015-03-11 ENCOUNTER — Ambulatory Visit (INDEPENDENT_AMBULATORY_CARE_PROVIDER_SITE_OTHER): Payer: Medicare Other | Admitting: Family Medicine

## 2015-03-11 VITALS — BP 123/76 | HR 67 | Temp 97.9°F | Resp 16 | Wt 180.1 lb

## 2015-03-11 DIAGNOSIS — J301 Allergic rhinitis due to pollen: Secondary | ICD-10-CM

## 2015-03-11 DIAGNOSIS — I639 Cerebral infarction, unspecified: Secondary | ICD-10-CM

## 2015-03-11 DIAGNOSIS — H6983 Other specified disorders of Eustachian tube, bilateral: Secondary | ICD-10-CM | POA: Diagnosis not present

## 2015-03-11 DIAGNOSIS — H6980 Other specified disorders of Eustachian tube, unspecified ear: Secondary | ICD-10-CM | POA: Insufficient documentation

## 2015-03-11 DIAGNOSIS — H6993 Unspecified Eustachian tube disorder, bilateral: Secondary | ICD-10-CM

## 2015-03-11 MED ORDER — AZELASTINE HCL 0.1 % NA SOLN: 2.0000 | Freq: Two times a day (BID) | NASAL | Status: DC

## 2015-03-11 MED ORDER — PREDNISONE 10 MG PO TABS: ORAL_TABLET | ORAL | Status: DC

## 2015-03-11 NOTE — Progress Notes (Signed)
Pre visit review using our clinic review tool, if applicable. No additional management support is needed unless otherwise documented below in the visit note. 

## 2015-03-11 NOTE — Progress Notes (Signed)
   Subjective:    Patient ID: Paul Drake, male    DOB: 05-30-1946, 69 y.o.   MRN: [id number]  HPI Ear pain- bilateral, started last night.  Pt has hx of allergies w/ ear problems.  Some drainage from ears bilaterally.  No pain w/ manipulation of external ear.  No fevers.  Using Zyrtec, Singulair, Flonase daily.   Review of Systems For ROS see HPI     Objective:   Physical Exam  Constitutional: He appears well-developed and well-nourished. No distress.  HENT:  Head: Normocephalic and atraumatic.  Right Ear: Tympanic membrane is retracted.  Left Ear: Tympanic membrane is retracted.  No TTP over sinuses + turbinate edema + PND  Eyes: Conjunctivae and EOM are normal. Pupils are equal, round, and reactive to light.  Neck: Normal range of motion. Neck supple.  Cardiovascular: Normal rate, regular rhythm and normal heart sounds.   Pulmonary/Chest: Effort normal and breath sounds normal. No respiratory distress. He has no wheezes.  Lymphadenopathy:    He has no cervical adenopathy.  Skin: Skin is warm and dry.  Vitals reviewed.         Assessment & Plan:

## 2015-03-11 NOTE — Assessment & Plan Note (Signed)
New.  This is the cause of pt's ear pain.  Start pred taper for inflammation.  Reviewed supportive care and red flags that should prompt return.  Pt expressed understanding and is in agreement w/ plan.

## 2015-03-11 NOTE — Assessment & Plan Note (Signed)
Deteriorated.  Now causing ear pain and pressure from eustachian tube dysfunction.  Already taking Zyrtec, Flonase, Singulair.  Add Astelin.  Discussed possible need for allergy referral- pt used to have allergy shots.  Reviewed supportive care and red flags that should prompt return.  Pt expressed understanding and is in agreement w/ plan.

## 2015-03-11 NOTE — Patient Instructions (Signed)
Follow up as needed Continue the Zyrtec, Singulair, Flonase daily ADD the Astelin- 2 sprays each nostril twice daily Start the Prednisone as directed- 3 tabs for 3 days, 2 tabs for 3 days, and then 1 tab for 3 days If no improvement in allergies or ear pain, please let me know so we can refer you to allergy Call with any questions or concerns Hang in there!!

## 2015-03-19 ENCOUNTER — Other Ambulatory Visit: Payer: Self-pay | Admitting: Family Medicine

## 2015-03-28 DIAGNOSIS — N201 Calculus of ureter: Secondary | ICD-10-CM | POA: Diagnosis not present

## 2015-03-28 DIAGNOSIS — N200 Calculus of kidney: Secondary | ICD-10-CM | POA: Diagnosis not present

## 2015-03-28 DIAGNOSIS — R319 Hematuria, unspecified: Secondary | ICD-10-CM | POA: Diagnosis not present

## 2015-03-28 DIAGNOSIS — M549 Dorsalgia, unspecified: Secondary | ICD-10-CM | POA: Diagnosis not present

## 2015-04-01 ENCOUNTER — Encounter: Payer: Self-pay | Admitting: Family Medicine

## 2015-04-01 ENCOUNTER — Ambulatory Visit (INDEPENDENT_AMBULATORY_CARE_PROVIDER_SITE_OTHER): Payer: Medicare Other | Admitting: Family Medicine

## 2015-04-01 VITALS — BP 108/62 | HR 78 | Temp 98.3°F | Ht 68.0 in | Wt 181.0 lb

## 2015-04-01 DIAGNOSIS — I10 Essential (primary) hypertension: Secondary | ICD-10-CM

## 2015-04-01 DIAGNOSIS — R739 Hyperglycemia, unspecified: Secondary | ICD-10-CM | POA: Diagnosis not present

## 2015-04-01 DIAGNOSIS — K219 Gastro-esophageal reflux disease without esophagitis: Secondary | ICD-10-CM

## 2015-04-01 DIAGNOSIS — Z8673 Personal history of transient ischemic attack (TIA), and cerebral infarction without residual deficits: Secondary | ICD-10-CM | POA: Diagnosis not present

## 2015-04-01 DIAGNOSIS — E785 Hyperlipidemia, unspecified: Secondary | ICD-10-CM

## 2015-04-01 DIAGNOSIS — I639 Cerebral infarction, unspecified: Secondary | ICD-10-CM

## 2015-04-01 HISTORY — DX: Essential (primary) hypertension: I10

## 2015-04-01 LAB — COMPREHENSIVE METABOLIC PANEL
ALK PHOS: 66 U/L (ref 39–117)
ALT: 15 U/L (ref 0–53)
AST: 21 U/L (ref 0–37)
Albumin: 4.1 g/dL (ref 3.5–5.2)
BUN: 26 mg/dL — ABNORMAL HIGH (ref 6–23)
CHLORIDE: 106 meq/L (ref 96–112)
CO2: 29 meq/L (ref 19–32)
Calcium: 9.4 mg/dL (ref 8.4–10.5)
Creatinine, Ser: 1.38 mg/dL (ref 0.40–1.50)
GFR: 54.01 mL/min — ABNORMAL LOW (ref >60.00)
Glucose, Bld: 79 mg/dL (ref 70–99)
POTASSIUM: 4 meq/L (ref 3.5–5.1)
SODIUM: 140 meq/L (ref 135–145)
Total Bilirubin: 0.6 mg/dL (ref 0.2–1.2)
Total Protein: 6.8 g/dL (ref 6.0–8.3)

## 2015-04-01 LAB — CBC
HEMATOCRIT: 40.8 % (ref 39.0–52.0)
Hemoglobin: 13.7 g/dL (ref 13.0–17.0)
MCHC: 33.6 g/dL (ref 30.0–36.0)
MCV: 96.7 fl (ref 78.0–100.0)
Platelets: 161 10*3/uL (ref 150.0–400.0)
RBC: 4.22 Mil/uL (ref 4.22–5.81)
RDW: 13 % (ref 11.5–15.5)
WBC: 6.2 10*3/uL (ref 4.0–10.5)

## 2015-04-01 LAB — LIPID PANEL
CHOLESTEROL: 158 mg/dL (ref 0–200)
HDL: 55.1 mg/dL (ref >39.00)
LDL Cholesterol: 70 mg/dL (ref 0–99)
NonHDL: 102.9
Total CHOL/HDL Ratio: 3
Triglycerides: 167 mg/dL — ABNORMAL HIGH (ref 0.0–149.0)
VLDL: 33.4 mg/dL (ref 0.0–40.0)

## 2015-04-01 LAB — TSH: TSH: 1.01 u[IU]/mL (ref 0.35–4.50)

## 2015-04-01 LAB — HEMOGLOBIN A1C: HEMOGLOBIN A1C: 5.6 % (ref 4.6–6.5)

## 2015-04-01 MED ORDER — RANITIDINE HCL 150 MG PO TABS: 150.0000 mg | ORAL_TABLET | Freq: Two times a day (BID) | ORAL | Status: DC

## 2015-04-01 NOTE — Patient Instructions (Signed)
Try to switch to cold brew coffee it has much less acid  Food Choices for Gastroesophageal Reflux Disease When you have gastroesophageal reflux disease (GERD), the foods you eat and your eating habits are very important. Choosing the right foods can help ease your discomfort.  WHAT GUIDELINES DO I NEED TO FOLLOW?   Choose fruits, vegetables, whole grains, and low-fat dairy products.   Choose low-fat meat, fish, and poultry.  Limit fats such as oils, salad dressings, butter, nuts, and avocado.   Keep a food diary. This helps you identify foods that cause symptoms.   Avoid foods that cause symptoms. These may be different for everyone.   Eat small meals often instead of 3 large meals a day.   Eat your meals slowly, in a place where you are relaxed.   Limit fried foods.   Cook foods using methods other than frying.   Avoid drinking alcohol.   Avoid drinking large amounts of liquids with your meals.   Avoid bending over or lying down until 2-3 hours after eating.  WHAT FOODS ARE NOT RECOMMENDED?  These are some foods and drinks that may make your symptoms worse: Vegetables Tomatoes. Tomato juice. Tomato and spaghetti sauce. Chili peppers. Onion and garlic. Horseradish. Fruits Oranges, grapefruit, and lemon (fruit and juice). Meats High-fat meats, fish, and poultry. This includes hot dogs, ribs, ham, sausage, salami, and bacon. Dairy Whole milk and chocolate milk. Sour cream. Cream. Butter. Ice cream. Cream cheese.  Drinks Coffee and tea. Bubbly (carbonated) drinks or energy drinks. Condiments Hot sauce. Barbecue sauce.  Sweets/Desserts Chocolate and cocoa. Donuts. Peppermint and spearmint. Fats and Oils High-fat foods. This includes [place] fries and potato chips. Other Vinegar. Strong spices. This includes black pepper, white pepper, red pepper, cayenne, curry powder, cloves, ginger, and chili powder. The items listed above may not be a complete list of foods  and drinks to avoid. Contact your dietitian for more information. Document Released: 03/04/2012 Document Revised: 09/08/2013 Document Reviewed: 07/08/2013 [facility] Patient Information 2015 [place], [place]. This information is not intended to replace advice given to you by your health care provider. Make sure you discuss any questions you have with your health care provider. s significantly less acid.

## 2015-04-01 NOTE — Assessment & Plan Note (Signed)
No new symptoms or events but continues to be frustrated with the numbness in the tips of the fingers in left hand. He does believe it is slowly getting better.

## 2015-04-01 NOTE — Assessment & Plan Note (Addendum)
Avoid offending foods, start probiotics. Do not eat large meals in late evening and consider raising head of bed. Has a history of Barrett's Esophagus. May use Ranitidine

## 2015-04-01 NOTE — Assessment & Plan Note (Signed)
minimize simple carbs. Increase exercise as tolerated. Continue current meds. Check an A1C

## 2015-04-01 NOTE — Progress Notes (Signed)
Pre visit review using our clinic review tool, if applicable. No additional management support is needed unless otherwise documented below in the visit note. 

## 2015-04-01 NOTE — Assessment & Plan Note (Signed)
Tolerating statin, encouraged heart healthy diet, avoid trans fats, minimize simple carbs and saturated fats. Increase exercise as tolerated 

## 2015-04-01 NOTE — Assessment & Plan Note (Signed)
Well controlled, no changes to meds. Encouraged heart healthy diet such as the DASH diet and exercise as tolerated.  °

## 2015-04-01 NOTE — Progress Notes (Signed)
Paul Drake  [id number] [birth year] 04/01/2015      Progress Note-Follow Up  Subjective  Chief Complaint  Chief Complaint  Patient presents with  . Follow-up    HPI  Patient is a 69 y.o. male in today for routine medical care. Patient has been following closely with neurology status post cerebrovascular accident earlier this year. Bubble study in April 2016 showed right to left shunt when the episode occurred No other recent illness, did have left sided weakness and numbness when episode occurred. Had a lengthy course of rehab and strength and gait are improved. Denies CP/palp/SOB/HA/congestion/fevers/GI or GU c/o. Taking meds as prescribed Past Medical History  Diagnosis Date  . Kidney stones   . CVA (cerebral infarction)   . Depression   . Hyperlipemia   . Hypertension   . Chicken pox   . Seasonal allergies     some asthma  . Measles as a child  . Mumps as a child  . Allergic state 12/29/2013  . Unspecified asthma(493.90) 12/29/2013  . GERD (gastroesophageal reflux disease)   . Arthritis   . IBS (irritable bowel syndrome)   . Otitis externa 12/10/2013  . Hyperglycemia 01/06/2015    Past Surgical History  Procedure Laterality Date  . Lithotripsy      multiple times  . Wisdom tooth extraction    . Colonoscopy    . Tee without cardioversion N/A 09/22/2014    Procedure: TRANSESOPHAGEAL ECHOCARDIOGRAM (TEE);  Surgeon: Josue Hector, MD;  Location: [facility] ENDOSCOPY;  Service: Cardiovascular;  Laterality: N/A;    Family History  Problem Relation Age of Onset  . Hypertension Mother   . Hyperlipidemia Mother   . Fibromyalgia Mother   . Arthritis Mother     rheumatoid  . Diabetes Sister     type 2  . Hyperlipidemia Brother   . Hypertension Brother   . Ulcers Father 36    Bleeding Ulcers  . Cancer Maternal Uncle     prostate  . Kidney Stones Daughter   . Asthma Daughter   . Healthy Son   . Cancer Maternal Grandfather     skin ?    History   Social History  .  Marital Status: Married    Spouse Name: Webb Silversmith  . Number of Children: 2  . Years of Education: college   Occupational History  .      retired   Social History Main Topics  . Smoking status: Never Smoker   . Smokeless tobacco: Never Used  . Alcohol Use: 0.0 oz/week    0 Standard drinks or equivalent per week     Comment: 1-2 occasioinal  . Drug Use: No  . 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 Not on file   Social History Narrative   Patient consumes 2 cups of caffeine daily    Current Outpatient Prescriptions on File Prior to Visit  Medication Sig Dispense Refill  . albuterol (PROVENTIL HFA;VENTOLIN HFA) 108 (90 BASE) MCG/ACT inhaler Inhale 2 puffs into the lungs every 6 (six) hours as needed for wheezing or shortness of breath. 1 Inhaler 3  . azelastine (ASTELIN) 0.1 % nasal spray Place 2 sprays into both nostrils 2 (two) times daily. Use in each nostril as directed 30 mL 12  . Cetirizine HCl (ZYRTEC ALLERGY PO) Take 10 mg by mouth daily.     . citalopram (CELEXA) 20 MG tablet Take  1 tablet (20 mg total) by mouth daily. 30 tablet 2  . fluticasone (FLONASE) 50 MCG/ACT nasal spray Place 2 sprays into both nostrils daily. 16 g 3  . losartan (COZAAR) 100 MG tablet Take 1 tablet (100 mg total) by mouth daily. 90 tablet 3  . losartan (COZAAR) 100 MG tablet TAKE 1 TABLET BY MOUTH EVERY DAY 90 tablet 0  . meclizine (ANTIVERT) 12.5 MG tablet Take 1 tablet (12.5 mg total) by mouth 3 (three) times daily as needed for dizziness. 30 tablet 0  . montelukast (SINGULAIR) 10 MG tablet Take 1 tablet (10 mg total) by mouth at bedtime as needed. 30 tablet 3  . Multiple Vitamins-Minerals (MULTIVITAMINS THER. W/MINERALS) TABS tablet Take 1 tablet by mouth daily.    . Omega-3 Fatty Acids (OMEGA-3 FISH OIL) 1200 MG CAPS Take 1,200 mg by mouth daily.    Marland Kitchen omeprazole (PRILOSEC) 40 MG capsule Take 1 capsule (40 mg total)  by mouth 2 (two) times daily. 180 capsule 3  . oxyCODONE-acetaminophen (ROXICET) 5-325 MG per tablet Take 1-2 every 4-6 hours as needed for pain    . RAPAFLO 8 MG CAPS capsule Take 8 mg by mouth daily with breakfast.   11  . rivaroxaban (XARELTO) 20 MG TABS tablet Take one 20mg  tablets once a day starting from 10/23/14 for 5 months. 30 tablet 4  . simvastatin (ZOCOR) 20 MG tablet Take 1 tablet (20 mg total) by mouth daily. 30 tablet 6   No current facility-administered medications on file prior to visit.    Allergies  Allergen Reactions  . Pollen Extract     Runny nose,itchy eyes  . Seasonal Ic [Cholestatin]     Itchy eyes and runny nose    Review of Systems  Review of Systems  Constitutional: Negative for fever and malaise/fatigue.  HENT: Negative for congestion.   Eyes: Negative for discharge.  Respiratory: Negative for shortness of breath.   Cardiovascular: Negative for chest pain, palpitations and leg swelling.  Gastrointestinal: Negative for nausea, abdominal pain and diarrhea.  Genitourinary: Positive for frequency. Negative for dysuria.  Musculoskeletal: Negative for falls.  Skin: Negative for rash.  Neurological: Positive for sensory change. Negative for loss of consciousness and headaches.  Endo/Heme/Allergies: Negative for polydipsia.  Psychiatric/Behavioral: Negative for depression and suicidal ideas. The patient is not nervous/anxious and does not have insomnia.     Objective  BP 108/62 mmHg  Pulse 78  Temp(Src) 98.3 F (36.8 C) (Oral)  Ht 5\' 8"  (1.727 m)  Wt 181 lb (82.101 kg)  BMI 27.53 kg/m2  SpO2 95%  Physical Exam  Physical Exam  Constitutional: He is oriented to person, place, and time and well-developed, well-nourished, and in no distress. No distress.  HENT:  Head: Normocephalic and atraumatic.  Eyes: Conjunctivae are normal.  Neck: Neck supple. No thyromegaly present.  Cardiovascular: Normal rate, regular rhythm and normal heart sounds.     Pulmonary/Chest: Effort normal and breath sounds normal. No respiratory distress.  Abdominal: Soft. Bowel sounds are normal. He exhibits no distension and no mass. There is no tenderness. There is no rebound and no guarding.  Musculoskeletal: He exhibits no edema.  Neurological: He is alert and oriented to person, place, and time.  Skin: Skin is warm.  Psychiatric: Memory, affect and judgment normal.    Lab Results  Component Value Date   TSH 3.75 12/31/2014   Lab Results  Component Value Date   WBC 6.4 12/31/2014   HGB 14.0 12/31/2014   HCT  41.8 12/31/2014   MCV 95.9 12/31/2014   PLT 175.0 12/31/2014   Lab Results  Component Value Date   CREATININE 1.09 12/31/2014   BUN 14 12/31/2014   NA 138 12/31/2014   K 4.3 12/31/2014   CL 103 12/31/2014   CO2 29 12/31/2014   Lab Results  Component Value Date   ALT 17 12/31/2014   AST 23 12/31/2014   ALKPHOS 75 12/31/2014   BILITOT 0.5 12/31/2014   Lab Results  Component Value Date   CHOL 159 12/31/2014   Lab Results  Component Value Date   HDL 52.00 12/31/2014   Lab Results  Component Value Date   LDLCALC 60 09/21/2014   Lab Results  Component Value Date   TRIG 233.0* 12/31/2014   Lab Results  Component Value Date   CHOLHDL 3 12/31/2014     Assessment & Plan  GERD (gastroesophageal reflux disease) Avoid offending foods, start probiotics. Do not eat large meals in late evening and consider raising head of bed. Has a history of Barrett's Esophagus. May use Ranitidine  Hyperglycemia  minimize simple carbs. Increase exercise as tolerated. Continue current meds. Check an A1C  History of CVA (cerebrovascular accident) No new symptoms or events but continues to be frustrated with the numbness in the tips of the fingers in left hand. He does believe it is slowly getting better.   HTN (hypertension), benign Well controlled, no changes to meds. Encouraged heart healthy diet such as the DASH diet and exercise as  tolerated.   Hyperlipidemia Tolerating statin, encouraged heart healthy diet, avoid trans fats, minimize simple carbs and saturated fats. Increase exercise as tolerated  Stroke Is following with Dr Erlinda Hong of neurology, recent bubble study shows right to left shunt, is referred to cardiology for further consideration. Continue anticoagulation

## 2015-04-05 ENCOUNTER — Other Ambulatory Visit: Payer: Self-pay | Admitting: Family Medicine

## 2015-04-05 NOTE — Telephone Encounter (Signed)
Advise on refill as you did not prescribed.

## 2015-04-12 ENCOUNTER — Other Ambulatory Visit: Payer: Self-pay | Admitting: Family Medicine

## 2015-04-13 DIAGNOSIS — N200 Calculus of kidney: Secondary | ICD-10-CM | POA: Diagnosis not present

## 2015-04-13 DIAGNOSIS — N201 Calculus of ureter: Secondary | ICD-10-CM | POA: Diagnosis not present

## 2015-04-18 NOTE — Assessment & Plan Note (Addendum)
Is following with Dr Erlinda Hong of neurology, recent bubble study shows right to left shunt, is referred to cardiology for further consideration. Continue anticoagulation

## 2015-05-09 ENCOUNTER — Encounter: Payer: Self-pay | Admitting: Neurology

## 2015-05-09 ENCOUNTER — Ambulatory Visit (INDEPENDENT_AMBULATORY_CARE_PROVIDER_SITE_OTHER): Payer: Medicare Other | Admitting: Neurology

## 2015-05-09 VITALS — BP 125/78 | HR 82 | Ht 68.0 in | Wt 182.6 lb

## 2015-05-09 DIAGNOSIS — I639 Cerebral infarction, unspecified: Secondary | ICD-10-CM

## 2015-05-09 DIAGNOSIS — Q211 Atrial septal defect: Secondary | ICD-10-CM | POA: Diagnosis not present

## 2015-05-09 DIAGNOSIS — Q2112 Patent foramen ovale: Secondary | ICD-10-CM

## 2015-05-09 DIAGNOSIS — I82401 Acute embolism and thrombosis of unspecified deep veins of right lower extremity: Secondary | ICD-10-CM

## 2015-05-09 DIAGNOSIS — I10 Essential (primary) hypertension: Secondary | ICD-10-CM | POA: Diagnosis not present

## 2015-05-09 MED ORDER — GABAPENTIN 100 MG PO CAPS: 100.0000 mg | ORAL_CAPSULE | Freq: Three times a day (TID) | ORAL | Status: DC

## 2015-05-09 MED ORDER — CLOPIDOGREL BISULFATE 75 MG PO TABS
75.0000 mg | ORAL_TABLET | Freq: Every day | ORAL | Status: DC
Start: 2015-05-09 — End: 2016-05-12

## 2015-05-09 NOTE — Patient Instructions (Addendum)
-   continue plavix and zocor for stroke prevention - discontinue Xarelto - follow up with urology for kidney stone treatment as we have stopped Xarelto - keep more active to prevent leg clot - limit straining maneuver  - will prescribe low dose neurontin for the numbness and pain - Follow up with your primary care physician for stroke risk factor modification. Recommend maintain blood pressure goal <130/80, diabetes with hemoglobin A1c goal below 6.5% and lipids with LDL cholesterol goal below 70 mg/dL.  - follow up in 3 months

## 2015-05-11 DIAGNOSIS — I10 Essential (primary) hypertension: Secondary | ICD-10-CM | POA: Insufficient documentation

## 2015-05-11 NOTE — Progress Notes (Signed)
STROKE NEUROLOGY FOLLOW UP NOTE  NAME: Paul Drake DOB: 1946/06/10  REASON FOR VISIT: stroke follow up HISTORY FROM: pt and chart  Today we had the pleasure of seeing Paul Drake in follow-up at our Neurology Clinic. Pt was accompanied by no one.   History Summary Paul Drake is a LH 69 y.o. male with a history of right MCA infarct in 2008 with residue LLE minimal weakness was admitted on 09/20/14 for acute onset left sided weakness and difficulty with speech.he received tPA. His acute right frontal stroke is adjacent to your previous stroke in 2008 shown in MRI. Stroke work up showed PFO with valsalva on TEE and positive right LE DVT. He was put on Xarelto 15mg  twice a day for 21 days and then 20mg  daily for 5 months. He mentioned that before this happened he also had some diarrhea and he might have done valsalva maneuvers then.   Patient's previous stroke was in 2008 and the patient has a resultant LLE numbness and weakness. Patient was on ASA but stopped it about a month ago due to gastritis.He denies N/V. However, shortly after the gastritis, he developed kidney stone. He may have a lot of pain which caused him to have valsalva maneuvers.   Follow up 11/24/14 - the patient has been doing well. No recurrent stroke like symptoms. He still has left hand numbness which causing trouble with writing and handling tools. He is on Xarelto daily. He followed up with his PCP closely. His BP today in clinic is 136/82. He still has OT twice a week but will soon be completed. His hypercoagulable work up in hospital was negative.   Interval History During the interval time, the patient has been doing well, no recurrent strokelike symptoms. He is still on Xarelto without side effect. However he continued to suffer from kidney stone, sometimes really painful. Urologist hesitant to do lithotripsy due to bleeding risk. He has been on Xarelto for more than 6 months now. He still reports intermittent  left-sided numbness and pain, especially fingers at left hand. No other complaints, blood pressure 125/78. TCD confirmed PFO and no MES on 30 min monitoring.   REVIEW OF SYSTEMS: Full 14 system review of systems performed and notable only for those listed below and in HPI above, all others are negative:  Constitutional:   Cardiovascular:  Ear/Nose/Throat:   Skin:  Eyes:   Respiratory:   Gastroitestinal:   Genitourinary: Kidney stone Hematology/Lymphatic:   Endocrine:  Musculoskeletal: back pain, neck pain  Allergy/Immunology:  Env allergies Neurological:  Dizziness and numbness Psychiatric:  Sleep: frequent waking, sleep talking  The following represents the patient's updated allergies and side effects list: Allergies  Allergen Reactions  . Pollen Extract     Runny nose,itchy eyes  . Seasonal Ic [Cholestatin]     Itchy eyes and runny nose    The neurologically relevant items on the patient's problem list were reviewed on today's visit.  Neurologic Examination  A problem focused neurological exam (12 or more points of the single system neurologic examination, vital signs counts as 1 point, cranial nerves count for 8 points) was performed.  Blood pressure 125/78, pulse 82, height 5\' 8"  (1.727 m), weight 182 lb 9.6 oz (82.827 kg).  General - Well nourished, well developed, in no apparent distress.  Ophthalmologic - Sharp disc margins OU.  Cardiovascular - Regular rate and rhythm with no murmur.  Mental Status -  Level of arousal and orientation to time, place, and person were  intact. Language including expression, naming, repetition, comprehension, reading, and writing was assessed and found intact. Attention span and concentration were normal. Recent and remote memory were intact. Fund of Knowledge was assessed and was intact.  Cranial Nerves II - XII - II - Visual field intact OU. III, IV, VI - Extraocular movements intact. V - Facial sensation intact  bilaterally. VII - Facial movement intact bilaterally. VIII - Hearing & vestibular intact bilaterally. X - Palate elevates symmetrically. XI - Chin turning & shoulder shrug intact bilaterally. XII - Tongue protrusion intact.  Motor Strength - The patient's strength was normal in all extremities and pronator drift was absent.  Bulk was normal and fasciculations were absent.   Motor Tone - Muscle tone was assessed at the neck and appendages and was normal.  Reflexes - The patient's reflexes were normal in all extremities and he had no pathological reflexes.  Sensory - Light touch, temperature/pinprick decreased on the left ulnar hand and all fingertips and Romberg testing were assessed and were normal.    Coordination - The patient had normal movements in the hands and feet with no ataxia or dysmetria.  Tremor was absent.  Gait and Station - The patient's transfers, posture, gait, station, and turns were observed as normal.  Data reviewed: I personally reviewed the images and agree with the radiology interpretations.  Ct Head (brain) Wo Contrast  09/20/2014 IMPRESSION: 1. No acute intracranial pathology seen on CT. 2. Chronic infarct at the high right parietal region, with associated encephalomalacia. 3. Mild cortical volume loss and scattered small vessel ischemic microangiopathy.   Carotid Doppler Bilateral: 1-39% ICA stenosis. Vertebral artery flow is antegrade.  2D Echocardiogram Pending  MRI HEAD Moderate size remote infarct posterior right frontal -parietal lobe with encephalomalacia. Small to moderate size acute infarct along the anterior margin of this remote infarct involving the posterior right frontal lobe extending to the posterior right operculum region. No intracranial hemorrhage.  MRA HEAD Decrease number of visualized right middle cerebral artery branch vessels consistent with patient's remote and acute infarct. No significant stenosis proximal to the branch  vessel level. Ectatic vertebral arteries and basilar artery with slight irregularity of the distal left vertebral artery without significant stenosis. Nonvisualized right posterior inferior cerebellar artery. Mild posterior cerebral artery branch vessel irregularity and narrowing more notable on the right.  EKG normal EKG, normal sinus rhythm. For complete results please see formal report.  LE venous doppler - The right lower extremity is positive for deep vein thrombosis involving the right gastrocnemius, posterior tibial, and peroneal veins. There is no evidence of left lower extremity deep vein thrombosis.  TEE -  1) PFO by bubble study not visualized Positive bubble study only with valsalva 2) Normal EF 60% 3) No LAA thrombus 4) Trivial MR 5) Mild AR 6) No aortic debris 7) Mild TR 8) Normal RV  TCD bubble study positive with grade I at rest and grade III with valsalva.  TCD emboli detection - negative for MES.    Assessment: As you may recall, he is a 69 y.o. Caucasian male with PMH of right MCA CVA in 2008 was admitted on1/4/16 for again right MCA small stroke. The first stroke more at right motor strip and the last stroke more at sensory strip. Stroke w/u showed positive DVT and a likely small PFO with RLS only on valsalva maneuver by TEE. However, TCD bubble study showed grade 1 RLS at rest and agrees 3 RLS with Valsalva maneuver. TCD MES negative. For  the two strokes, it is likely pt had some valsalva maneuver prior to the strokes. He is on Xarelto for more than 6 months and question is whether we should keep him on it for questionable recurrent DVT and stroke. Due to potential kidney stone surgery with bleeding risk, we optioned to switch Xarelto to plavix for stroke prevention. Low-dose Neurontin for numbness/pain.  Plan:  - Start plavix and zocor for stroke prevention - discontinue Xarelto - follow up with urology for kidney stone treatment after stopping Xarelto - limit  straining maneuver  - will prescribe low dose neurontin for the numbness and pain. - Follow up with your primary care physician for stroke risk factor modification. Recommend maintain blood pressure goal <130/80, diabetes with hemoglobin A1c goal below 6.5% and lipids with LDL cholesterol goal below 70 mg/dL.  - RTC in 3 months  No orders of the defined types were placed in this encounter.    Meds ordered this encounter  Medications  . clopidogrel (PLAVIX) 75 MG tablet    Sig: Take 1 tablet (75 mg total) by mouth daily.    Dispense:  90 tablet    Refill:  3  . gabapentin (NEURONTIN) 100 MG capsule    Sig: Take 1 capsule (100 mg total) by mouth 3 (three) times daily.    Dispense:  90 capsule    Refill:  2    Patient Instructions  - continue plavix and zocor for stroke prevention - discontinue Xarelto - follow up with urology for kidney stone treatment as we have stopped Xarelto - keep more active to prevent leg clot - limit straining maneuver  - will prescribe low dose neurontin for the numbness and pain - Follow up with your primary care physician for stroke risk factor modification. Recommend maintain blood pressure goal <130/80, diabetes with hemoglobin A1c goal below 6.5% and lipids with LDL cholesterol goal below 70 mg/dL.  - follow up in 3 months    Rosalin Hawking, MD PhD [facility] [address] [place], [place] [address] [phone]

## 2015-05-12 DIAGNOSIS — N210 Calculus in bladder: Secondary | ICD-10-CM | POA: Diagnosis not present

## 2015-05-12 DIAGNOSIS — M549 Dorsalgia, unspecified: Secondary | ICD-10-CM | POA: Diagnosis not present

## 2015-05-12 DIAGNOSIS — R110 Nausea: Secondary | ICD-10-CM | POA: Diagnosis not present

## 2015-05-12 DIAGNOSIS — R319 Hematuria, unspecified: Secondary | ICD-10-CM | POA: Diagnosis not present

## 2015-05-12 DIAGNOSIS — N201 Calculus of ureter: Secondary | ICD-10-CM | POA: Diagnosis not present

## 2015-05-17 ENCOUNTER — Other Ambulatory Visit: Payer: Self-pay | Admitting: Family Medicine

## 2015-05-17 MED ORDER — CITALOPRAM HYDROBROMIDE 20 MG PO TABS: ORAL_TABLET | ORAL | Status: DC

## 2015-05-20 ENCOUNTER — Ambulatory Visit (INDEPENDENT_AMBULATORY_CARE_PROVIDER_SITE_OTHER): Payer: Medicare Other | Admitting: Family Medicine

## 2015-05-20 ENCOUNTER — Encounter: Payer: Self-pay | Admitting: Family Medicine

## 2015-05-20 ENCOUNTER — Telehealth: Payer: Self-pay | Admitting: Family Medicine

## 2015-05-20 VITALS — BP 120/82 | HR 65 | Temp 98.0°F | Ht 68.0 in | Wt 178.2 lb

## 2015-05-20 DIAGNOSIS — I10 Essential (primary) hypertension: Secondary | ICD-10-CM

## 2015-05-20 DIAGNOSIS — Z23 Encounter for immunization: Secondary | ICD-10-CM | POA: Diagnosis not present

## 2015-05-20 DIAGNOSIS — H65191 Other acute nonsuppurative otitis media, right ear: Secondary | ICD-10-CM | POA: Diagnosis not present

## 2015-05-20 DIAGNOSIS — I639 Cerebral infarction, unspecified: Secondary | ICD-10-CM

## 2015-05-20 DIAGNOSIS — R739 Hyperglycemia, unspecified: Secondary | ICD-10-CM | POA: Diagnosis not present

## 2015-05-20 DIAGNOSIS — T7840XD Allergy, unspecified, subsequent encounter: Secondary | ICD-10-CM

## 2015-05-20 DIAGNOSIS — N400 Enlarged prostate without lower urinary tract symptoms: Secondary | ICD-10-CM | POA: Diagnosis not present

## 2015-05-20 MED ORDER — CEFDINIR 300 MG PO CAPS: 300.0000 mg | ORAL_CAPSULE | Freq: Two times a day (BID) | ORAL | Status: AC

## 2015-05-20 NOTE — Telephone Encounter (Signed)
Patient informed and scheduled with Dr. Charlett Blake today.

## 2015-05-20 NOTE — Patient Instructions (Signed)
Otitis Media °Otitis media is redness, soreness, and puffiness (swelling) in the space just behind your eardrum (middle ear). It may be caused by allergies or infection. It often happens along with a cold. °HOME CARE °· Take your medicine as told. Finish it even if you start to feel better. °· Only take over-the-counter or prescription medicines for pain, discomfort, or fever as told by your doctor. °· Follow up with your doctor as told. °GET HELP IF: °· You have otitis media only in one ear, or bleeding from your nose, or both. °· You notice a lump on your neck. °· You are not getting better in 3-5 days. °· You feel worse instead of better. °GET HELP RIGHT AWAY IF:  °· You have pain that is not helped with medicine. °· You have puffiness, redness, or pain around your ear. °· You get a stiff neck. °· You cannot move part of your face (paralysis). °· You notice that the bone behind your ear hurts when you touch it. °MAKE SURE YOU:  °· Understand these instructions. °· Will watch your condition. °· Will get help right away if you are not doing well or get worse. °Document Released: 02/20/2008 Document Revised: 09/08/2013 Document Reviewed: 03/31/2013 °[employer]® Patient Information ©2015 [employer]. This information is not intended to replace advice given to you by your health care provider. Make sure you discuss any questions you have with your health care provider. ° °

## 2015-05-20 NOTE — Telephone Encounter (Signed)
Sorry but so many different things can cause ear aches, some just need pain meds, some just need time, some need antibiotics, cannot know without seeing this. I could refer him to ENT for further evaluation if he would like or he can come in here for an urgent visit.

## 2015-05-20 NOTE — Telephone Encounter (Signed)
Pt called in to see if Dr. B would call in him a script for an ear ache .Marland Kitchen He says that he commonly gets them.   Pt's CB#: [phone]

## 2015-05-20 NOTE — Progress Notes (Signed)
Pre visit review using our clinic review tool, if applicable. No additional management support is needed unless otherwise documented below in the visit note. 

## 2015-05-23 NOTE — Progress Notes (Deleted)
Cardiology Office Note   Date:  05/23/2015   ID:  Paul Drake, DOB 10-18-1945, MRN [id number]  PCP:  Penni Homans, MD  Cardiologist:   Sharol Harness, MD   No chief complaint on file.     History of Present Illness: Paul Drake is a 69 y.o. male with hypertension, prior stroke and DVT who presents for ***  Paul Drake had a R MCA stroke in 2008 with residual LLE weakness.  He had a recurrent stroke 09/20/14 and the post-stroke TEE had a positive bubble study with R to L shunt.  He was also found to have a R LE DVT.  He was started on Xarelto 15mg  bid x21 days followed by 20mg  daily for 5 months.  Paul Drake was on aspirin therapy after his first stroke, but this was held one month prior to his second stroke due to gastritis.  Paul Drake followed up with Dr. Erlinda Hong on 05/11/15.  At that time, Xarelto was discontinued and he was started on plavix. Rec: statin, antiplatelet  Past Medical History  Diagnosis Date  . Kidney stones   . CVA (cerebral infarction)   . Depression   . Hyperlipemia   . Hypertension   . Chicken pox   . Seasonal allergies     some asthma  . Measles as a child  . Mumps as a child  . Allergic state 12/29/2013  . Unspecified asthma(493.90) 12/29/2013  . GERD (gastroesophageal reflux disease)   . Arthritis   . IBS (irritable bowel syndrome)   . Otitis externa 12/10/2013  . Hyperglycemia 01/06/2015  . HTN (hypertension), benign 04/01/2015    Past Surgical History  Procedure Laterality Date  . Lithotripsy      multiple times  . Wisdom tooth extraction    . Colonoscopy    . Tee without cardioversion N/A 09/22/2014    Procedure: TRANSESOPHAGEAL ECHOCARDIOGRAM (TEE);  Surgeon: Josue Hector, MD;  Location: [facility];  Service: Cardiovascular;  Laterality: N/A;     Current Outpatient Prescriptions  Medication Sig Dispense Refill  . albuterol (PROVENTIL HFA;VENTOLIN HFA) 108 (90 BASE) MCG/ACT inhaler Inhale 2 puffs into the lungs every 6 (six) hours  as needed for wheezing or shortness of breath. 1 Inhaler 3  . azelastine (ASTELIN) 0.1 % nasal spray Place 2 sprays into both nostrils 2 (two) times daily. Use in each nostril as directed 30 mL 12  . cefdinir (OMNICEF) 300 MG capsule Take 1 capsule (300 mg total) by mouth 2 (two) times daily. 28 capsule 0  . Cetirizine HCl (ZYRTEC ALLERGY PO) Take 10 mg by mouth daily.     . citalopram (CELEXA) 20 MG tablet TAKE 1 TABLET(20 MG) BY MOUTH DAILY 30 tablet 3  . clopidogrel (PLAVIX) 75 MG tablet Take 1 tablet (75 mg total) by mouth daily. 90 tablet 3  . fluticasone (FLONASE) 50 MCG/ACT nasal spray Place 2 sprays into both nostrils daily. 16 g 3  . gabapentin (NEURONTIN) 100 MG capsule Take 1 capsule (100 mg total) by mouth 3 (three) times daily. 90 capsule 2  . losartan (COZAAR) 100 MG tablet Take 1 tablet (100 mg total) by mouth daily. 90 tablet 3  . meclizine (ANTIVERT) 12.5 MG tablet Take 1 tablet (12.5 mg total) by mouth 3 (three) times daily as needed for dizziness. 30 tablet 0  . montelukast (SINGULAIR) 10 MG tablet Take 1 tablet (10 mg total) by mouth at bedtime as needed. 30 tablet 3  . Multiple Vitamins-Minerals (MULTIVITAMINS  THER. W/MINERALS) TABS tablet Take 1 tablet by mouth daily.    . Omega-3 Fatty Acids (OMEGA-3 FISH OIL) 1200 MG CAPS Take 1,200 mg by mouth daily.    Marland Kitchen omeprazole (PRILOSEC) 40 MG capsule Take 1 capsule (40 mg total) by mouth 2 (two) times daily. 180 capsule 3  . oxyCODONE-acetaminophen (ROXICET) 5-325 MG per tablet Take 1-2 every 4-6 hours as needed for pain    . ranitidine (ZANTAC) 150 MG tablet Take 1 tablet (150 mg total) by mouth 2 (two) times daily. 30 tablet 5  . RAPAFLO 8 MG CAPS capsule Take 8 mg by mouth daily with breakfast.   11  . simvastatin (ZOCOR) 20 MG tablet Take 1 tablet (20 mg total) by mouth daily. 30 tablet 6   No current facility-administered medications for this visit.    Allergies:   Pollen extract and Seasonal ic    Social History:  The  patient  reports that he has never smoked. He has never used smokeless tobacco. He reports that he drinks alcohol. He reports that he does not use illicit drugs.   Family History:  The patient's ***family history includes Arthritis in his mother; Asthma in his daughter; Cancer in his maternal grandfather and maternal uncle; Diabetes in his sister; Fibromyalgia in his mother; Healthy in his son; Hyperlipidemia in his brother and mother; Hypertension in his brother and mother; Kidney Stones in his daughter; Ulcers (age of onset: 59) in his father.    ROS:  Please see the history of present illness.   Otherwise, review of systems are positive for {NONE DEFAULTED:18576}.   All other systems are reviewed and negative.    PHYSICAL EXAM: VS:  There were no vitals taken for this visit. , BMI There is no weight on file to calculate BMI. GENERAL:  Well appearing HEENT:  Pupils equal round and reactive, fundi not visualized, oral mucosa unremarkable NECK:  No jugular venous distention, waveform within normal limits, carotid upstroke brisk and symmetric, no bruits, no thyromegaly LYMPHATICS:  No cervical adenopathy LUNGS:  Clear to auscultation bilaterally HEART:  RRR.  PMI not displaced or sustained,S1 and S2 within normal limits, no S3, no S4, no clicks, no rubs, *** murmurs ABD:  Flat, positive bowel sounds normal in frequency in pitch, no bruits, no rebound, no guarding, no midline pulsatile mass, no hepatomegaly, no splenomegaly EXT:  2 plus pulses throughout, no edema, no cyanosis no clubbing SKIN:  No rashes no nodules NEURO:  Cranial nerves II through XII grossly intact, motor grossly intact throughout PSYCH:  Cognitively intact, oriented to person place and time    EKG:  EKG {ACTION; IS/IS QHU:[id number]} ordered today. The ekg ordered today demonstrates ***   Recent Labs: 04/01/2015: ALT 15; BUN 26*; Creatinine, Ser 1.38; Hemoglobin 13.7; Platelets 161.0; Potassium 4.0; Sodium 140; TSH 1.01      Lipid Panel    Component Value Date/Time   CHOL 158 04/01/2015 0937   TRIG 167.0* 04/01/2015 0937   HDL 55.10 04/01/2015 0937   CHOLHDL 3 04/01/2015 0937   VLDL 33.4 04/01/2015 0937   LDLCALC 70 04/01/2015 0937   LDLDIRECT 76.0 12/31/2014 0924      Wt Readings from Last 3 Encounters:  05/20/15 80.854 kg (178 lb 4 oz)  05/09/15 82.827 kg (182 lb 9.6 oz)  04/01/15 82.101 kg (181 lb)     TEE 09/22/14:  LVEF 55-60%.  Mild AR, MR.  LA mildly dilated.  R to L shunt with Valsalva. Carotid u/s 09/21/14:  1-39% stenosis of the R and L ICA.  Other studies Reviewed: Additional studies/ records that were reviewed today include: ***. Review of the above records demonstrates:  Please see elsewhere in the note.  ***   ASSESSMENT AND PLAN:  ***   Current medicines are reviewed at length with the patient today.  The patient {ACTIONS; HAS/DOES NOT HAVE:19233} concerns regarding medicines.  The following changes have been made:  {PLAN; NO CHANGE:13088:s}  Labs/ tests ordered today include: *** No orders of the defined types were placed in this encounter.     Disposition:   FU with ***    Signed, Sharol Harness, MD  05/23/2015 4:57 PM    [facility]

## 2015-05-24 ENCOUNTER — Encounter: Payer: Medicare Other | Admitting: Cardiovascular Disease

## 2015-05-24 ENCOUNTER — Other Ambulatory Visit: Payer: Self-pay | Admitting: Family Medicine

## 2015-05-24 MED ORDER — SIMVASTATIN 20 MG PO TABS: 20.0000 mg | ORAL_TABLET | Freq: Every day | ORAL | Status: DC

## 2015-05-24 NOTE — Progress Notes (Signed)
This encounter was created in error - please disregard.

## 2015-05-26 DIAGNOSIS — N210 Calculus in bladder: Secondary | ICD-10-CM | POA: Diagnosis not present

## 2015-06-05 ENCOUNTER — Encounter: Payer: Self-pay | Admitting: Family Medicine

## 2015-06-05 NOTE — Progress Notes (Signed)
Subjective:    Patient ID: Paul Drake, male    DOB: 1946/02/19, 69 y.o.   MRN: [id number]  Chief Complaint  Patient presents with  . Ear Pain    HPI Patient is in today for 1 day history of ear pain. He has struggled with recurrent ear pain, eustachian tube dysfunction and otitis media over the years. He is frustrated with his ongoing pain. He also has recurrent allergies and congestion. Denies fevers and chills. Has been seen by urology and undergone cystoscopy and they report no concerns. Neurology has seen him and evaluated him for nerve damage in his fingers and the denying any concerns. Denies CP/palp/SOB/HA/congestion/fevers/GI or GU c/o. Taking meds as prescribed  Past Medical History  Diagnosis Date  . Kidney stones   . CVA (cerebral infarction)   . Depression   . Hyperlipemia   . Hypertension   . Chicken pox   . Seasonal allergies     some asthma  . Measles as a child  . Mumps as a child  . Allergic state 12/29/2013  . Unspecified asthma(493.90) 12/29/2013  . GERD (gastroesophageal reflux disease)   . Arthritis   . IBS (irritable bowel syndrome)   . Otitis externa 12/10/2013  . Hyperglycemia 01/06/2015  . HTN (hypertension), benign 04/01/2015    Past Surgical History  Procedure Laterality Date  . Lithotripsy      multiple times  . Wisdom tooth extraction    . Colonoscopy    . Tee without cardioversion N/A 09/22/2014    Procedure: TRANSESOPHAGEAL ECHOCARDIOGRAM (TEE);  Surgeon: Josue Hector, MD;  Location: [facility] ENDOSCOPY;  Service: Cardiovascular;  Laterality: N/A;    Family History  Problem Relation Age of Onset  . Hypertension Mother   . Hyperlipidemia Mother   . Fibromyalgia Mother   . Arthritis Mother     rheumatoid  . Diabetes Sister     type 2  . Hyperlipidemia Brother   . Hypertension Brother   . Ulcers Father 36    Bleeding Ulcers  . Cancer Maternal Uncle     prostate  . Kidney Stones Daughter   . Asthma Daughter   . Healthy Son   . Cancer  Maternal Grandfather     skin ?    Social History   Social History  . Marital Status: Married    Spouse Name: Webb Silversmith  . Number of Children: 2  . Years of Education: college   Occupational History  .      retired   Social History Main Topics  . Smoking status: Never Smoker   . Smokeless tobacco: Never Used  . Alcohol Use: 0.0 oz/week    0 Standard drinks or equivalent per week     Comment: 1-2 occasioinal  . Drug Use: No  . 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 Not on file   Social History Narrative   Patient consumes 2 cups of caffeine daily    Outpatient Prescriptions Prior to Visit  Medication Sig Dispense Refill  . albuterol (PROVENTIL HFA;VENTOLIN HFA) 108 (90 BASE) MCG/ACT inhaler Inhale 2 puffs into the lungs every 6 (six) hours as needed for wheezing or shortness of breath. 1 Inhaler 3  . azelastine (ASTELIN) 0.1 % nasal spray Place 2 sprays into both nostrils 2 (two) times daily. Use in each nostril as directed 30 mL 12  . Cetirizine HCl (ZYRTEC ALLERGY PO) Take  10 mg by mouth daily.     . citalopram (CELEXA) 20 MG tablet TAKE 1 TABLET(20 MG) BY MOUTH DAILY 30 tablet 3  . clopidogrel (PLAVIX) 75 MG tablet Take 1 tablet (75 mg total) by mouth daily. 90 tablet 3  . fluticasone (FLONASE) 50 MCG/ACT nasal spray Place 2 sprays into both nostrils daily. 16 g 3  . gabapentin (NEURONTIN) 100 MG capsule Take 1 capsule (100 mg total) by mouth 3 (three) times daily. 90 capsule 2  . losartan (COZAAR) 100 MG tablet Take 1 tablet (100 mg total) by mouth daily. 90 tablet 3  . meclizine (ANTIVERT) 12.5 MG tablet Take 1 tablet (12.5 mg total) by mouth 3 (three) times daily as needed for dizziness. 30 tablet 0  . montelukast (SINGULAIR) 10 MG tablet Take 1 tablet (10 mg total) by mouth at bedtime as needed. 30 tablet 3  . Multiple Vitamins-Minerals (MULTIVITAMINS THER. W/MINERALS) TABS tablet Take  1 tablet by mouth daily.    . Omega-3 Fatty Acids (OMEGA-3 FISH OIL) 1200 MG CAPS Take 1,200 mg by mouth daily.    Marland Kitchen omeprazole (PRILOSEC) 40 MG capsule Take 1 capsule (40 mg total) by mouth 2 (two) times daily. 180 capsule 3  . oxyCODONE-acetaminophen (ROXICET) 5-325 MG per tablet Take 1-2 every 4-6 hours as needed for pain    . ranitidine (ZANTAC) 150 MG tablet Take 1 tablet (150 mg total) by mouth 2 (two) times daily. 30 tablet 5  . RAPAFLO 8 MG CAPS capsule Take 8 mg by mouth daily with breakfast.   11  . simvastatin (ZOCOR) 20 MG tablet Take 1 tablet (20 mg total) by mouth daily. 30 tablet 6   No facility-administered medications prior to visit.    Allergies  Allergen Reactions  . Pollen Extract     Runny nose,itchy eyes  . Seasonal Ic [Cholestatin]     Itchy eyes and runny nose    Review of Systems  Constitutional: Negative for fever and malaise/fatigue.  HENT: Positive for congestion and ear pain.   Eyes: Negative for discharge.  Respiratory: Negative for shortness of breath.   Cardiovascular: Negative for chest pain, palpitations and leg swelling.  Gastrointestinal: Negative for nausea and abdominal pain.  Genitourinary: Negative for dysuria.  Musculoskeletal: Negative for falls.  Skin: Negative for rash.  Neurological: Positive for tingling. Negative for loss of consciousness and headaches.  Endo/Heme/Allergies: Negative for environmental allergies.  Psychiatric/Behavioral: Negative for depression. The patient is not nervous/anxious.        Objective:    Physical Exam  Constitutional: He is oriented to person, place, and time. He appears well-developed and well-nourished. No distress.  HENT:  Head: Normocephalic and atraumatic.  Nose: Nose normal.  Eyes: Right eye exhibits no discharge. Left eye exhibits no discharge.  Neck: Normal range of motion. Neck supple.  Cardiovascular: Normal rate and regular rhythm.   No murmur heard. Pulmonary/Chest: Effort normal and  breath sounds normal.  Abdominal: Soft. Bowel sounds are normal. There is no tenderness.  Musculoskeletal: He exhibits no edema.  Neurological: He is alert and oriented to person, place, and time.  Skin: Skin is warm and dry.  Psychiatric: He has a normal mood and affect.  Nursing note and vitals reviewed.   BP 120/82 mmHg  Pulse 65  Temp(Src) 98 F (36.7 C) (Oral)  Ht 5\' 8"  (1.727 m)  Wt 178 lb 4 oz (80.854 kg)  BMI 27.11 kg/m2  SpO2 96% Wt Readings from Last 3 Encounters:  05/20/15  178 lb 4 oz (80.854 kg)  05/09/15 182 lb 9.6 oz (82.827 kg)  04/01/15 181 lb (82.101 kg)     Lab Results  Component Value Date   WBC 6.2 04/01/2015   HGB 13.7 04/01/2015   HCT 40.8 04/01/2015   PLT 161.0 04/01/2015   GLUCOSE 79 04/01/2015   CHOL 158 04/01/2015   TRIG 167.0* 04/01/2015   HDL 55.10 04/01/2015   LDLDIRECT 76.0 12/31/2014   LDLCALC 70 04/01/2015   ALT 15 04/01/2015   AST 21 04/01/2015   NA 140 04/01/2015   K 4.0 04/01/2015   CL 106 04/01/2015   CREATININE 1.38 04/01/2015   BUN 26* 04/01/2015   CO2 29 04/01/2015   TSH 1.01 04/01/2015   INR 0.98 09/20/2014   HGBA1C 5.6 04/01/2015    Lab Results  Component Value Date   TSH 1.01 04/01/2015   Lab Results  Component Value Date   WBC 6.2 04/01/2015   HGB 13.7 04/01/2015   HCT 40.8 04/01/2015   MCV 96.7 04/01/2015   PLT 161.0 04/01/2015   Lab Results  Component Value Date   NA 140 04/01/2015   K 4.0 04/01/2015   CO2 29 04/01/2015   GLUCOSE 79 04/01/2015   BUN 26* 04/01/2015   CREATININE 1.38 04/01/2015   BILITOT 0.6 04/01/2015   ALKPHOS 66 04/01/2015   AST 21 04/01/2015   ALT 15 04/01/2015   PROT 6.8 04/01/2015   ALBUMIN 4.1 04/01/2015   CALCIUM 9.4 04/01/2015   ANIONGAP 8 09/20/2014   GFR 54.01* 04/01/2015   Lab Results  Component Value Date   CHOL 158 04/01/2015   Lab Results  Component Value Date   HDL 55.10 04/01/2015   Lab Results  Component Value Date   LDLCALC 70 04/01/2015   Lab  Results  Component Value Date   TRIG 167.0* 04/01/2015   Lab Results  Component Value Date   CHOLHDL 3 04/01/2015   Lab Results  Component Value Date   HGBA1C 5.6 04/01/2015       Assessment & Plan:   Problem List Items Addressed This Visit    Otitis media    No sign of current infection but patient with recurrent infections and pain. Referred to ENT for further consideration      Relevant Orders   Ambulatory referral to ENT   Hyperglycemia    hgba1c acceptable, minimize simple carbs. Increase exercise as tolerated.      HTN (hypertension), benign    Well controlled, no changes to meds. Encouraged heart healthy diet such as the DASH diet and exercise as tolerated.       BPH (benign prostatic hyperplasia)    Is established with urology and doing well      Allergic state    Encouraged daily antihistamines and nasal steroids       Other Visit Diagnoses    Encounter for immunization    -  Primary       I am having Mr. Mayse start on cefdinir. I am also having him maintain his Cetirizine HCl (ZYRTEC ALLERGY PO), multivitamins ther. w/minerals, Omega-3 Fish Oil, albuterol, omeprazole, RAPAFLO, losartan, fluticasone, meclizine, montelukast, oxyCODONE-acetaminophen, azelastine, ranitidine, clopidogrel, gabapentin, and citalopram.  Meds ordered this encounter  Medications  . cefdinir (OMNICEF) 300 MG capsule    Sig: Take 1 capsule (300 mg total) by mouth 2 (two) times daily.    Dispense:  28 capsule    Refill:  0     Penni Homans, MD

## 2015-06-05 NOTE — Assessment & Plan Note (Signed)
Well controlled, no changes to meds. Encouraged heart healthy diet such as the DASH diet and exercise as tolerated.  °

## 2015-06-05 NOTE — Assessment & Plan Note (Signed)
No sign of current infection but patient with recurrent infections and pain. Referred to ENT for further consideration

## 2015-06-05 NOTE — Assessment & Plan Note (Signed)
Is established with urology and doing well

## 2015-06-05 NOTE — Assessment & Plan Note (Signed)
hgba1c acceptable, minimize simple carbs. Increase exercise as tolerated.  

## 2015-06-05 NOTE — Assessment & Plan Note (Signed)
Encouraged daily antihistamines and nasal steroids 

## 2015-06-08 DIAGNOSIS — H9203 Otalgia, bilateral: Secondary | ICD-10-CM | POA: Diagnosis not present

## 2015-06-08 DIAGNOSIS — M2669 Other specified disorders of temporomandibular joint: Secondary | ICD-10-CM | POA: Diagnosis not present

## 2015-06-14 DIAGNOSIS — L821 Other seborrheic keratosis: Secondary | ICD-10-CM | POA: Diagnosis not present

## 2015-06-14 DIAGNOSIS — L820 Inflamed seborrheic keratosis: Secondary | ICD-10-CM | POA: Diagnosis not present

## 2015-06-14 DIAGNOSIS — D485 Neoplasm of uncertain behavior of skin: Secondary | ICD-10-CM | POA: Diagnosis not present

## 2015-06-14 DIAGNOSIS — L578 Other skin changes due to chronic exposure to nonionizing radiation: Secondary | ICD-10-CM | POA: Diagnosis not present

## 2015-06-16 ENCOUNTER — Encounter (INDEPENDENT_AMBULATORY_CARE_PROVIDER_SITE_OTHER): Payer: Self-pay

## 2015-06-16 ENCOUNTER — Encounter: Payer: Self-pay | Admitting: Family Medicine

## 2015-06-16 ENCOUNTER — Ambulatory Visit (INDEPENDENT_AMBULATORY_CARE_PROVIDER_SITE_OTHER): Payer: Medicare Other | Admitting: Family Medicine

## 2015-06-16 VITALS — BP 120/74 | HR 62 | Temp 97.8°F | Ht 68.0 in | Wt 178.2 lb

## 2015-06-16 DIAGNOSIS — I639 Cerebral infarction, unspecified: Secondary | ICD-10-CM | POA: Diagnosis not present

## 2015-06-16 DIAGNOSIS — E785 Hyperlipidemia, unspecified: Secondary | ICD-10-CM

## 2015-06-16 DIAGNOSIS — H6500 Acute serous otitis media, unspecified ear: Secondary | ICD-10-CM

## 2015-06-16 DIAGNOSIS — R739 Hyperglycemia, unspecified: Secondary | ICD-10-CM | POA: Diagnosis not present

## 2015-06-16 DIAGNOSIS — K219 Gastro-esophageal reflux disease without esophagitis: Secondary | ICD-10-CM | POA: Diagnosis not present

## 2015-06-16 DIAGNOSIS — F418 Other specified anxiety disorders: Secondary | ICD-10-CM

## 2015-06-16 DIAGNOSIS — N400 Enlarged prostate without lower urinary tract symptoms: Secondary | ICD-10-CM

## 2015-06-16 DIAGNOSIS — Z0000 Encounter for general adult medical examination without abnormal findings: Secondary | ICD-10-CM

## 2015-06-16 DIAGNOSIS — I10 Essential (primary) hypertension: Secondary | ICD-10-CM

## 2015-06-16 DIAGNOSIS — N200 Calculus of kidney: Secondary | ICD-10-CM

## 2015-06-16 DIAGNOSIS — R200 Anesthesia of skin: Secondary | ICD-10-CM | POA: Diagnosis not present

## 2015-06-16 DIAGNOSIS — E782 Mixed hyperlipidemia: Secondary | ICD-10-CM

## 2015-06-16 DIAGNOSIS — J301 Allergic rhinitis due to pollen: Secondary | ICD-10-CM

## 2015-06-16 NOTE — Progress Notes (Signed)
Pre visit review using our clinic review tool, if applicable. No additional management support is needed unless otherwise documented below in the visit note. 

## 2015-06-16 NOTE — Patient Instructions (Signed)
[email or web address] for natural shampoos, products Preventive Care for Adults A healthy lifestyle and preventive care can promote health and wellness. Preventive health guidelines for men include the following key practices:  A routine yearly physical is a good way to check with your health care provider about your health and preventative screening. It is a chance to share any concerns and updates on your health and to receive a thorough exam.  Visit your dentist for a routine exam and preventative care every 6 months. Brush your teeth twice a day and floss once a day. Good oral hygiene prevents tooth decay and gum disease.  The frequency of eye exams is based on your age, health, family medical history, use of contact lenses, and other factors. Follow your health care provider's recommendations for frequency of eye exams.  Eat a healthy diet. Foods such as vegetables, fruits, whole grains, low-fat dairy products, and lean protein foods contain the nutrients you need without too many calories. Decrease your intake of foods high in solid fats, added sugars, and salt. Eat the right amount of calories for you.Get information about a proper diet from your health care provider, if necessary.  Regular physical exercise is one of the most important things you can do for your health. Most adults should get at least 150 minutes of moderate-intensity exercise (any activity that increases your heart rate and causes you to sweat) each week. In addition, most adults need muscle-strengthening exercises on 2 or more days a week.  Maintain a healthy weight. The body mass index (BMI) is a screening tool to identify possible weight problems. It provides an estimate of body fat based on height and weight. Your health care provider can find your BMI and can help you achieve or maintain a healthy weight.For adults 20 years and older:  A BMI below 18.5 is considered underweight.  A BMI of 18.5 to 24.9 is normal.  A  BMI of 25 to 29.9 is considered overweight.  A BMI of 30 and above is considered obese.  Maintain normal blood lipids and cholesterol levels by exercising and minimizing your intake of saturated fat. Eat a balanced diet with plenty of fruit and vegetables. Blood tests for lipids and cholesterol should begin at age 73 and be repeated every 5 years. If your lipid or cholesterol levels are high, you are over 50, or you are at high risk for heart disease, you may need your cholesterol levels checked more frequently.Ongoing high lipid and cholesterol levels should be treated with medicines if diet and exercise are not working.  If you smoke, find out from your health care provider how to quit. If you do not use tobacco, do not start.  Lung cancer screening is recommended for adults aged 70-80 years who are at high risk for developing lung cancer because of a history of smoking. A yearly low-dose CT scan of the lungs is recommended for people who have at least a 30-pack-year history of smoking and are a current smoker or have quit within the past 15 years. A pack year of smoking is smoking an average of 1 pack of cigarettes a day for 1 year (for example: 1 pack a day for 30 years or 2 packs a day for 15 years). Yearly screening should continue until the smoker has stopped smoking for at least 15 years. Yearly screening should be stopped for people who develop a health problem that would prevent them from having lung cancer treatment.  If you choose to drink alcohol,  do not have more than 2 drinks per day. One drink is considered to be 12 ounces (355 mL) of beer, 5 ounces (148 mL) of wine, or 1.5 ounces (44 mL) of liquor.  Avoid use of street drugs. Do not share needles with anyone. Ask for help if you need support or instructions about stopping the use of drugs.  High blood pressure causes heart disease and increases the risk of stroke. Your blood pressure should be checked at least every 1-2 years. Ongoing  high blood pressure should be treated with medicines, if weight loss and exercise are not effective.  If you are 54-46 years old, ask your health care provider if you should take aspirin to prevent heart disease.  Diabetes screening involves taking a blood sample to check your fasting blood sugar level. This should be done once every 3 years, after age 52, if you are within normal weight and without risk factors for diabetes. Testing should be considered at a younger age or be carried out more frequently if you are overweight and have at least 1 risk factor for diabetes.  Colorectal cancer can be detected and often prevented. Most routine colorectal cancer screening begins at the age of 33 and continues through age 47. However, your health care provider may recommend screening at an earlier age if you have risk factors for colon cancer. On a yearly basis, your health care provider may provide home test kits to check for hidden blood in the stool. Use of a small camera at the end of a tube to directly examine the colon (sigmoidoscopy or colonoscopy) can detect the earliest forms of colorectal cancer. Talk to your health care provider about this at age 5, when routine screening begins. Direct exam of the colon should be repeated every 5-10 years through age 26, unless early forms of precancerous polyps or small growths are found.  People who are at an increased risk for hepatitis B should be screened for this virus. You are considered at high risk for hepatitis B if:  You were born in a country where hepatitis B occurs often. Talk with your health care provider about which countries are considered high risk.  Your parents were born in a high-risk country and you have not received a shot to protect against hepatitis B (hepatitis B vaccine).  You have HIV or AIDS.  You use needles to inject street drugs.  You live with, or have sex with, someone who has hepatitis B.  You are a man who has sex with  other men (MSM).  You get hemodialysis treatment.  You take certain medicines for conditions such as cancer, organ transplantation, and autoimmune conditions.  Hepatitis C blood testing is recommended for all people born from [birth year] through 1965 and any individual with known risks for hepatitis C.  Practice safe sex. Use condoms and avoid high-risk sexual practices to reduce the spread of sexually transmitted infections (STIs). STIs include gonorrhea, chlamydia, syphilis, trichomonas, herpes, HPV, and human immunodeficiency virus (HIV). Herpes, HIV, and HPV are viral illnesses that have no cure. They can result in disability, cancer, and death.  If you are at risk of being infected with HIV, it is recommended that you take a prescription medicine daily to prevent HIV infection. This is called preexposure prophylaxis (PrEP). You are considered at risk if:  You are a man who has sex with other men (MSM) and have other risk factors.  You are a heterosexual man, are sexually active, and are at  increased risk for HIV infection.  You take drugs by injection.  You are sexually active with a partner who has HIV.  Talk with your health care provider about whether you are at high risk of being infected with HIV. If you choose to begin PrEP, you should first be tested for HIV. You should then be tested every 3 months for as long as you are taking PrEP.  A one-time screening for abdominal aortic aneurysm (AAA) and surgical repair of large AAAs by ultrasound are recommended for men ages 49 to 19 years who are current or former smokers.  Healthy men should no longer receive prostate-specific antigen (PSA) blood tests as part of routine cancer screening. Talk with your health care provider about prostate cancer screening.  Testicular cancer screening is not recommended for adult males who have no symptoms. Screening includes self-exam, a health care provider exam, and other screening tests. Consult with  your health care provider about any symptoms you have or any concerns you have about testicular cancer.  Use sunscreen. Apply sunscreen liberally and repeatedly throughout the day. You should seek shade when your shadow is shorter than you. Protect yourself by wearing long sleeves, pants, a wide-brimmed hat, and sunglasses year round, whenever you are outdoors.  Once a month, do a whole-body skin exam, using a mirror to look at the skin on your back. Tell your health care provider about new moles, moles that have irregular borders, moles that are larger than a pencil eraser, or moles that have changed in shape or color.  Stay current with required vaccines (immunizations).  Influenza vaccine. All adults should be immunized every year.  Tetanus, diphtheria, and acellular pertussis (Td, Tdap) vaccine. An adult who has not previously received Tdap or who does not know his vaccine status should receive 1 dose of Tdap. This initial dose should be followed by tetanus and diphtheria toxoids (Td) booster doses every 10 years. Adults with an unknown or incomplete history of completing a 3-dose immunization series with Td-containing vaccines should begin or complete a primary immunization series including a Tdap dose. Adults should receive a Td booster every 10 years.  Varicella vaccine. An adult without evidence of immunity to varicella should receive 2 doses or a second dose if he has previously received 1 dose.  Human papillomavirus (HPV) vaccine. Males aged 81-21 years who have not received the vaccine previously should receive the 3-dose series. Males aged 22-26 years may be immunized. Immunization is recommended through the age of 81 years for any male who has sex with males and did not get any or all doses earlier. Immunization is recommended for any person with an immunocompromised condition through the age of 55 years if he did not get any or all doses earlier. During the 3-dose series, the second dose  should be obtained 4-8 weeks after the first dose. The third dose should be obtained 24 weeks after the first dose and 16 weeks after the second dose.  Zoster vaccine. One dose is recommended for adults aged 37 years or older unless certain conditions are present.  Measles, mumps, and rubella (MMR) vaccine. Adults born before [birth year] generally are considered immune to measles and mumps. Adults born in [birth year] or later should have 1 or more doses of MMR vaccine unless there is a contraindication to the vaccine or there is laboratory evidence of immunity to each of the three diseases. A routine second dose of MMR vaccine should be obtained at least 28 days after the  first dose for students attending postsecondary schools, health care workers, or international travelers. People who received inactivated measles vaccine or an unknown type of measles vaccine during 1963-1967 should receive 2 doses of MMR vaccine. People who received inactivated mumps vaccine or an unknown type of mumps vaccine before 1979 and are at high risk for mumps infection should consider immunization with 2 doses of MMR vaccine. Unvaccinated health care workers born before [birth year] who lack laboratory evidence of measles, mumps, or rubella immunity or laboratory confirmation of disease should consider measles and mumps immunization with 2 doses of MMR vaccine or rubella immunization with 1 dose of MMR vaccine.  Pneumococcal 13-valent conjugate (PCV13) vaccine. When indicated, a person who is uncertain of his immunization history and has no record of immunization should receive the PCV13 vaccine. An adult aged 33 years or older who has certain medical conditions and has not been previously immunized should receive 1 dose of PCV13 vaccine. This PCV13 should be followed with a dose of pneumococcal polysaccharide (PPSV23) vaccine. The PPSV23 vaccine dose should be obtained at least 8 weeks after the dose of PCV13 vaccine. An adult aged 64 years or older  who has certain medical conditions and previously received 1 or more doses of PPSV23 vaccine should receive 1 dose of PCV13. The PCV13 vaccine dose should be obtained 1 or more years after the last PPSV23 vaccine dose.  Pneumococcal polysaccharide (PPSV23) vaccine. When PCV13 is also indicated, PCV13 should be obtained first. All adults aged 16 years and older should be immunized. An adult younger than age 52 years who has certain medical conditions should be immunized. Any person who resides in a nursing home or long-term care facility should be immunized. An adult smoker should be immunized. People with an immunocompromised condition and certain other conditions should receive both PCV13 and PPSV23 vaccines. People with human immunodeficiency virus (HIV) infection should be immunized as soon as possible after diagnosis. Immunization during chemotherapy or radiation therapy should be avoided. Routine use of PPSV23 vaccine is not recommended for American Indians, [place], or people younger than 65 years unless there are medical conditions that require PPSV23 vaccine. When indicated, people who have unknown immunization and have no record of immunization should receive PPSV23 vaccine. One-time revaccination 5 years after the first dose of PPSV23 is recommended for people aged 19-64 years who have chronic kidney failure, nephrotic syndrome, asplenia, or immunocompromised conditions. People who received 1-2 doses of PPSV23 before age 66 years should receive another dose of PPSV23 vaccine at age 9 years or later if at least 5 years have passed since the previous dose. Doses of PPSV23 are not needed for people immunized with PPSV23 at or after age 27 years.  Meningococcal vaccine. Adults with asplenia or persistent complement component deficiencies should receive 2 doses of quadrivalent meningococcal conjugate (MenACWY-D) vaccine. The doses should be obtained at least 2 months apart. Microbiologists working  with certain meningococcal bacteria, [employer], people at risk during an outbreak, and people who travel to or live in countries with a high rate of meningitis should be immunized. A first-year college student up through age 67 years who is living in a residence hall should receive a dose if he did not receive a dose on or after his 16th birthday. Adults who have certain high-risk conditions should receive one or more doses of vaccine.  Hepatitis A vaccine. Adults who wish to be protected from this disease, have certain high-risk conditions, work with hepatitis A-infected animals, work  in hepatitis A research labs, or travel to or work in countries with a high rate of hepatitis A should be immunized. Adults who were previously unvaccinated and who anticipate close contact with an international adoptee during the first 60 days after arrival in the [place] from a country with a high rate of hepatitis A should be immunized.  Hepatitis B vaccine. Adults should be immunized if they wish to be protected from this disease, have certain high-risk conditions, may be exposed to blood or other infectious body fluids, are household contacts or sex partners of hepatitis B positive people, are clients or workers in certain care facilities, or travel to or work in countries with a high rate of hepatitis B.  Haemophilus influenzae type b (Hib) vaccine. A previously unvaccinated person with asplenia or sickle cell disease or having a scheduled splenectomy should receive 1 dose of Hib vaccine. Regardless of previous immunization, a recipient of a hematopoietic stem cell transplant should receive a 3-dose series 6-12 months after his successful transplant. Hib vaccine is not recommended for adults with HIV infection. Preventive Service / Frequency Ages 28 to 78  Blood pressure check.** / Every 1 to 2 years.  Lipid and cholesterol check.** / Every 5 years beginning at age 45.  Hepatitis C blood test.** / For  any individual with known risks for hepatitis C.  Skin self-exam. / Monthly.  Influenza vaccine. / Every year.  Tetanus, diphtheria, and acellular pertussis (Tdap, Td) vaccine.** / Consult your health care provider. 1 dose of Td every 10 years.  Varicella vaccine.** / Consult your health care provider.  HPV vaccine. / 3 doses over 6 months, if 43 or younger.  Measles, mumps, rubella (MMR) vaccine.** / You need at least 1 dose of MMR if you were born in 1957 or later. You may also need a second dose.  Pneumococcal 13-valent conjugate (PCV13) vaccine.** / Consult your health care provider.  Pneumococcal polysaccharide (PPSV23) vaccine.** / 1 to 2 doses if you smoke cigarettes or if you have certain conditions.  Meningococcal vaccine.** / 1 dose if you are age 85 to 55 years and a Market researcher living in a residence hall, or have one of several medical conditions. You may also need additional booster doses.  Hepatitis A vaccine.** / Consult your health care provider.  Hepatitis B vaccine.** / Consult your health care provider.  Haemophilus influenzae type b (Hib) vaccine.** / Consult your health care provider. Ages 78 to 61  Blood pressure check.** / Every 1 to 2 years.  Lipid and cholesterol check.** / Every 5 years beginning at age 54.  Lung cancer screening. / Every year if you are aged 47-80 years and have a 30-pack-year history of smoking and currently smoke or have quit within the past 15 years. Yearly screening is stopped once you have quit smoking for at least 15 years or develop a health problem that would prevent you from having lung cancer treatment.  Fecal occult blood test (FOBT) of stool. / Every year beginning at age 15 and continuing until age 17. You may not have to do this test if you get a colonoscopy every 10 years.  Flexible sigmoidoscopy** or colonoscopy.** / Every 5 years for a flexible sigmoidoscopy or every 10 years for a colonoscopy beginning at  age 82 and continuing until age 53.  Hepatitis C blood test.** / For all people born from [birth year] through 1965 and any individual with known risks for hepatitis C.  Skin self-exam. /  Monthly.  Influenza vaccine. / Every year.  Tetanus, diphtheria, and acellular pertussis (Tdap/Td) vaccine.** / Consult your health care provider. 1 dose of Td every 10 years.  Varicella vaccine.** / Consult your health care provider.  Zoster vaccine.** / 1 dose for adults aged 36 years or older.  Measles, mumps, rubella (MMR) vaccine.** / You need at least 1 dose of MMR if you were born in 1959 or later. You may also need a second dose.  Pneumococcal 13-valent conjugate (PCV13) vaccine.** / Consult your health care provider.  Pneumococcal polysaccharide (PPSV23) vaccine.** / 1 to 2 doses if you smoke cigarettes or if you have certain conditions.  Meningococcal vaccine.** / Consult your health care provider.  Hepatitis A vaccine.** / Consult your health care provider.  Hepatitis B vaccine.** / Consult your health care provider.  Haemophilus influenzae type b (Hib) vaccine.** / Consult your health care provider. Ages 108 and over  Blood pressure check.** / Every 1 to 2 years.  Lipid and cholesterol check.**/ Every 5 years beginning at age 87.  Lung cancer screening. / Every year if you are aged 80-80 years and have a 30-pack-year history of smoking and currently smoke or have quit within the past 15 years. Yearly screening is stopped once you have quit smoking for at least 15 years or develop a health problem that would prevent you from having lung cancer treatment.  Fecal occult blood test (FOBT) of stool. / Every year beginning at age 42 and continuing until age 65. You may not have to do this test if you get a colonoscopy every 10 years.  Flexible sigmoidoscopy** or colonoscopy.** / Every 5 years for a flexible sigmoidoscopy or every 10 years for a colonoscopy beginning at age 91 and continuing until  age 8.  Hepatitis C blood test.** / For all people born from [birth year] through 1967 and any individual with known risks for hepatitis C.  Abdominal aortic aneurysm (AAA) screening.** / A one-time screening for ages 41 to 39 years who are current or former smokers.  Skin self-exam. / Monthly.  Influenza vaccine. / Every year.  Tetanus, diphtheria, and acellular pertussis (Tdap/Td) vaccine.** / 1 dose of Td every 10 years.  Varicella vaccine.** / Consult your health care provider.  Zoster vaccine.** / 1 dose for adults aged 27 years or older.  Pneumococcal 13-valent conjugate (PCV13) vaccine.** / Consult your health care provider.  Pneumococcal polysaccharide (PPSV23) vaccine.** / 1 dose for all adults aged 59 years and older.  Meningococcal vaccine.** / Consult your health care provider.  Hepatitis A vaccine.** / Consult your health care provider.  Hepatitis B vaccine.** / Consult your health care provider.  Haemophilus influenzae type b (Hib) vaccine.** / Consult your health care provider. **Family history and personal history of risk and conditions may change your health care provider's recommendations. Document Released: 10/30/2001 Document Revised: 09/08/2013 Document Reviewed: 01/29/2011 [facility] Patient Information 2015 [place], [place]. This information is not intended to replace advice given to you by your health care provider. Make sure you discuss any questions you have with your health care provider.

## 2015-06-26 ENCOUNTER — Encounter: Payer: Self-pay | Admitting: Family Medicine

## 2015-06-26 DIAGNOSIS — Z0000 Encounter for general adult medical examination without abnormal findings: Secondary | ICD-10-CM

## 2015-06-26 HISTORY — DX: Encounter for general adult medical examination without abnormal findings: Z00.00

## 2015-06-26 NOTE — Assessment & Plan Note (Addendum)
Patient denies any difficulties at home. No trouble with ADLs, depression or falls. No recent changes to vision or hearing. Is UTD with immunizations. Is UTD with screening. Discussed Advanced Directives, patient agrees to bring [place] copies of documents if can. Encouraged heart healthy diet, exercise as tolerated and adequate sleep. Labs reviewed. Urology, Dr [facility] dermatology [facility], Dr Fuller Plan See problem list for risk factors See AVS for health maintenance

## 2015-06-26 NOTE — Progress Notes (Signed)
Subjective:    Patient ID: Paul Drake, male    DOB: 1946/03/25, 69 y.o.   MRN: [id number]  Chief Complaint  Patient presents with  . Annual Exam    HPI Patient is in today for annual exam. She is feeling better today than the was earlier in the month. She was seen here with ear pain and treated. ENT saw him after that and said he had no ongoing ear infection. He reports no concerning symptoms today. No other recent illness. She needs to struggle with urinary urgency and some frequency and has had stones in the last several months but no fevers or chills. Is doing well at home and with his ADLs. Eating well and staying active. Denies CP/palp/SOB/HA/congestion/fevers/GI c/o. Taking meds as prescribed  Past Medical History  Diagnosis Date  . Kidney stones   . CVA (cerebral infarction)   . Depression   . Hyperlipemia   . Hypertension   . Chicken pox   . Seasonal allergies     some asthma  . Measles as a child  . Mumps as a child  . Allergic state 12/29/2013  . Unspecified asthma(493.90) 12/29/2013  . GERD (gastroesophageal reflux disease)   . Arthritis   . IBS (irritable bowel syndrome)   . Otitis externa 12/10/2013  . Hyperglycemia 01/06/2015  . HTN (hypertension), benign 04/01/2015  . Medicare annual wellness visit, subsequent 06/26/2015    Past Surgical History  Procedure Laterality Date  . Lithotripsy      multiple times  . Wisdom tooth extraction    . Colonoscopy    . Tee without cardioversion N/A 09/22/2014    Procedure: TRANSESOPHAGEAL ECHOCARDIOGRAM (TEE);  Surgeon: Josue Hector, MD;  Location: [facility] ENDOSCOPY;  Service: Cardiovascular;  Laterality: N/A;    Family History  Problem Relation Age of Onset  . Hypertension Mother   . Hyperlipidemia Mother   . Fibromyalgia Mother   . Arthritis Mother     rheumatoid  . Diabetes Sister     type 2  . Hyperlipidemia Brother   . Hypertension Brother   . Ulcers Father 36    Bleeding Ulcers  . Cancer Maternal Uncle    prostate  . Kidney Stones Daughter   . Asthma Daughter   . Healthy Son   . Cancer Maternal Grandfather     skin ?    Social History   Social History  . Marital Status: Married    Spouse Name: Webb Silversmith  . Number of Children: 2  . Years of Education: college   Occupational History  .      retired   Social History Main Topics  . Smoking status: Never Smoker   . Smokeless tobacco: Never Used  . Alcohol Use: 0.0 oz/week    0 Standard drinks or equivalent per week     Comment: 1-2 occasioinal  . Drug Use: No  . 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 Not on file   Social History Narrative   Patient consumes 2 cups of caffeine daily    Outpatient Prescriptions Prior to Visit  Medication Sig Dispense Refill  . Cetirizine HCl (ZYRTEC ALLERGY PO) Take 10 mg by mouth daily.     . citalopram (CELEXA) 20 MG tablet TAKE 1 TABLET(20 MG) BY MOUTH DAILY 30 tablet 3  . clopidogrel (PLAVIX) 75 MG tablet Take 1 tablet (75 mg total) by mouth daily. [place]  tablet 3  . fluticasone (FLONASE) 50 MCG/ACT nasal spray Place 2 sprays into both nostrils daily. 16 g 3  . losartan (COZAAR) 100 MG tablet Take 1 tablet (100 mg total) by mouth daily. 90 tablet 3  . montelukast (SINGULAIR) 10 MG tablet Take 1 tablet (10 mg total) by mouth at bedtime as needed. 30 tablet 3  . Multiple Vitamins-Minerals (MULTIVITAMINS THER. W/MINERALS) TABS tablet Take 1 tablet by mouth daily.    . Omega-3 Fatty Acids (OMEGA-3 FISH OIL) 1200 MG CAPS Take 1,200 mg by mouth daily.    Marland Kitchen omeprazole (PRILOSEC) 40 MG capsule Take 1 capsule (40 mg total) by mouth 2 (two) times daily. 180 capsule 3  . ranitidine (ZANTAC) 150 MG tablet Take 1 tablet (150 mg total) by mouth 2 (two) times daily. 30 tablet 5  . simvastatin (ZOCOR) 20 MG tablet Take 1 tablet (20 mg total) by mouth daily. 30 tablet 6  . meclizine (ANTIVERT) 12.5 MG tablet Take 1 tablet (12.5  mg total) by mouth 3 (three) times daily as needed for dizziness. 30 tablet 0  . RAPAFLO 8 MG CAPS capsule Take 8 mg by mouth daily with breakfast.   11  . albuterol (PROVENTIL HFA;VENTOLIN HFA) 108 (90 BASE) MCG/ACT inhaler Inhale 2 puffs into the lungs every 6 (six) hours as needed for wheezing or shortness of breath. (Patient not taking: Reported on 06/16/2015) 1 Inhaler 3  . azelastine (ASTELIN) 0.1 % nasal spray Place 2 sprays into both nostrils 2 (two) times daily. Use in each nostril as directed (Patient not taking: Reported on 06/16/2015) 30 mL 12  . gabapentin (NEURONTIN) 100 MG capsule Take 1 capsule (100 mg total) by mouth 3 (three) times daily. (Patient not taking: Reported on 06/16/2015) 90 capsule 2  . oxyCODONE-acetaminophen (ROXICET) 5-325 MG per tablet Take 1-2 every 4-6 hours as needed for pain     No facility-administered medications prior to visit.    Allergies  Allergen Reactions  . Pollen Extract     Runny nose,itchy eyes  . Seasonal Ic [Cholestatin]     Itchy eyes and runny nose    Review of Systems  Constitutional: Negative for fever, chills and malaise/fatigue.  HENT: Negative for congestion and hearing loss.   Eyes: Negative for discharge.  Respiratory: Negative for cough, sputum production and shortness of breath.   Cardiovascular: Negative for chest pain, palpitations and leg swelling.  Gastrointestinal: Negative for heartburn, nausea, vomiting, abdominal pain, diarrhea, constipation and blood in stool.  Genitourinary: Positive for frequency. Negative for dysuria, urgency and hematuria.  Musculoskeletal: Negative for myalgias, back pain and falls.  Skin: Negative for rash.  Neurological: Positive for focal weakness. Negative for dizziness, sensory change, loss of consciousness, weakness and headaches.       Left hand weakness  Endo/Heme/Allergies: Negative for environmental allergies. Does not bruise/bleed easily.  Psychiatric/Behavioral: Negative for  depression and suicidal ideas. The patient is not nervous/anxious and does not have insomnia.        Objective:    Physical Exam  Constitutional: He is oriented to person, place, and time. He appears well-developed and well-nourished. No distress.  HENT:  Head: Normocephalic and atraumatic.  Eyes: Conjunctivae are normal.  Neck: Neck supple. No thyromegaly present.  Cardiovascular: Normal rate, regular rhythm and normal heart sounds.   No murmur heard. Pulmonary/Chest: Effort normal and breath sounds normal. No respiratory distress. He has no wheezes.  Abdominal: Soft. Bowel sounds are normal. He exhibits no mass. There is no  tenderness.  Musculoskeletal: He exhibits no edema.  Lymphadenopathy:    He has no cervical adenopathy.  Neurological: He is alert and oriented to person, place, and time.  Skin: Skin is warm and dry.  Psychiatric: He has a normal mood and affect. His behavior is normal.    BP 120/74 mmHg  Pulse 62  Temp(Src) 97.8 F (36.6 C) (Oral)  Ht 5\' 8"  (1.727 m)  Wt 178 lb 4 oz (80.854 kg)  BMI 27.11 kg/m2  SpO2 96% Wt Readings from Last 3 Encounters:  06/16/15 178 lb 4 oz (80.854 kg)  05/20/15 178 lb 4 oz (80.854 kg)  05/09/15 182 lb 9.6 oz (82.827 kg)     Lab Results  Component Value Date   WBC 6.2 04/01/2015   HGB 13.7 04/01/2015   HCT 40.8 04/01/2015   PLT 161.0 04/01/2015   GLUCOSE 79 04/01/2015   CHOL 158 04/01/2015   TRIG 167.0* 04/01/2015   HDL 55.10 04/01/2015   LDLDIRECT 76.0 12/31/2014   LDLCALC 70 04/01/2015   ALT 15 04/01/2015   AST 21 04/01/2015   NA 140 04/01/2015   K 4.0 04/01/2015   CL 106 04/01/2015   CREATININE 1.38 04/01/2015   BUN 26* 04/01/2015   CO2 29 04/01/2015   TSH 1.01 04/01/2015   INR 0.98 09/20/2014   HGBA1C 5.6 04/01/2015    Lab Results  Component Value Date   TSH 1.01 04/01/2015   Lab Results  Component Value Date   WBC 6.2 04/01/2015   HGB 13.7 04/01/2015   HCT 40.8 04/01/2015   MCV 96.7 04/01/2015     PLT 161.0 04/01/2015   Lab Results  Component Value Date   NA 140 04/01/2015   K 4.0 04/01/2015   CO2 29 04/01/2015   GLUCOSE 79 04/01/2015   BUN 26* 04/01/2015   CREATININE 1.38 04/01/2015   BILITOT 0.6 04/01/2015   ALKPHOS 66 04/01/2015   AST 21 04/01/2015   ALT 15 04/01/2015   PROT 6.8 04/01/2015   ALBUMIN 4.1 04/01/2015   CALCIUM 9.4 04/01/2015   ANIONGAP 8 09/20/2014   GFR 54.01* 04/01/2015   Lab Results  Component Value Date   CHOL 158 04/01/2015   Lab Results  Component Value Date   HDL 55.10 04/01/2015   Lab Results  Component Value Date   LDLCALC 70 04/01/2015   Lab Results  Component Value Date   TRIG 167.0* 04/01/2015   Lab Results  Component Value Date   CHOLHDL 3 04/01/2015   Lab Results  Component Value Date   HGBA1C 5.6 04/01/2015       Assessment & Plan:   Problem List Items Addressed This Visit    Stroke ([place])    Continues to struggle with left hand numbness but is slightly improved.      Recurrent nephrolithiasis    Maintain adequate hydration and follow up with urology      Otitis media    H/o recurrent episodes, recently seen by ENT who reported no infection. Patient does feel better.      Medicare annual wellness visit, subsequent    Patient denies any difficulties at home. No trouble with ADLs, depression or falls. No recent changes to vision or hearing. Is UTD with immunizations. Is UTD with screening. Discussed Advanced Directives, patient agrees to bring [place] copies of documents if can. Encouraged heart healthy diet, exercise as tolerated and adequate sleep. Labs reviewed. Urology, Dr [facility] dermatology [facility], Dr Fuller Plan See problem list for  risk factors See AVS for health maintenance      Hyperlipidemia    Tolerating statin, encouraged heart healthy diet, avoid trans fats, minimize simple carbs and saturated fats. Increase exercise as tolerated      Hyperglycemia     minimize simple  carbs. Increase exercise as tolerated.       HTN (hypertension), benign    Well controlled, no changes to meds. Encouraged heart healthy diet such as the DASH diet and exercise as tolerated.       GERD (gastroesophageal reflux disease)    Avoid offending foods, start probiotics. Do not eat large meals in late evening and consider raising head of bed.       Depression with anxiety    Improved on citalopram continue the same      BPH (benign prostatic hyperplasia)    Follows with urology and doing well      Allergic rhinitis    Other Visit Diagnoses    Preventative health care    -  Primary    Relevant Orders    TSH    CBC    Comprehensive metabolic panel    Lipid panel    Hepatitis C Antibody    Hyperlipidemia, mixed        Relevant Orders    TSH    CBC    Comprehensive metabolic panel    Lipid panel    Hepatitis C Antibody    Benign essential HTN        Relevant Orders    TSH    CBC    Comprehensive metabolic panel    Lipid panel    Hepatitis C Antibody       I have discontinued Mr. Hartig's RAPAFLO, meclizine, and oxyCODONE-acetaminophen. I am also having him maintain his Cetirizine HCl (ZYRTEC ALLERGY PO), multivitamins ther. w/minerals, Omega-3 Fish Oil, albuterol, omeprazole, losartan, fluticasone, montelukast, azelastine, ranitidine, clopidogrel, gabapentin, citalopram, and simvastatin.  No orders of the defined types were placed in this encounter.     Penni Homans, MD

## 2015-06-26 NOTE — Assessment & Plan Note (Signed)
Maintain adequate hydration and follow up with urology

## 2015-06-26 NOTE — Assessment & Plan Note (Signed)
H/o recurrent episodes, recently seen by ENT who reported no infection. Patient does feel better.

## 2015-06-26 NOTE — Assessment & Plan Note (Signed)
minimize simple carbs. Increase exercise as tolerated.  

## 2015-06-26 NOTE — Assessment & Plan Note (Signed)
Follows with urology and doing well

## 2015-06-26 NOTE — Assessment & Plan Note (Signed)
Tolerating statin, encouraged heart healthy diet, avoid trans fats, minimize simple carbs and saturated fats. Increase exercise as tolerated 

## 2015-06-26 NOTE — Assessment & Plan Note (Signed)
Improved on citalopram continue the same

## 2015-06-26 NOTE — Assessment & Plan Note (Signed)
Asymptomatic, no recurrence

## 2015-06-26 NOTE — Assessment & Plan Note (Signed)
Avoid offending foods, start probiotics. Do not eat large meals in late evening and consider raising head of bed.  

## 2015-06-26 NOTE — Assessment & Plan Note (Signed)
Continues to struggle with left hand numbness but is slightly improved.

## 2015-06-26 NOTE — Assessment & Plan Note (Signed)
Well controlled, no changes to meds. Encouraged heart healthy diet such as the DASH diet and exercise as tolerated.  °

## 2015-07-04 ENCOUNTER — Other Ambulatory Visit: Payer: Self-pay | Admitting: Family Medicine

## 2015-07-04 MED ORDER — LOSARTAN POTASSIUM 100 MG PO TABS: 100.0000 mg | ORAL_TABLET | Freq: Every day | ORAL | Status: DC

## 2015-08-15 ENCOUNTER — Encounter: Payer: Self-pay | Admitting: Neurology

## 2015-08-15 ENCOUNTER — Ambulatory Visit (INDEPENDENT_AMBULATORY_CARE_PROVIDER_SITE_OTHER): Payer: Medicare Other | Admitting: Neurology

## 2015-08-15 VITALS — Ht 68.0 in | Wt 182.4 lb

## 2015-08-15 DIAGNOSIS — I82401 Acute embolism and thrombosis of unspecified deep veins of right lower extremity: Secondary | ICD-10-CM | POA: Insufficient documentation

## 2015-08-15 DIAGNOSIS — I10 Essential (primary) hypertension: Secondary | ICD-10-CM | POA: Diagnosis not present

## 2015-08-15 DIAGNOSIS — I63411 Cerebral infarction due to embolism of right middle cerebral artery: Secondary | ICD-10-CM | POA: Diagnosis not present

## 2015-08-15 DIAGNOSIS — Q211 Atrial septal defect: Secondary | ICD-10-CM | POA: Diagnosis not present

## 2015-08-15 DIAGNOSIS — Q2112 Patent foramen ovale: Secondary | ICD-10-CM

## 2015-08-15 DIAGNOSIS — I639 Cerebral infarction, unspecified: Secondary | ICD-10-CM

## 2015-08-15 NOTE — Patient Instructions (Signed)
-   Start plavix and zocor for stroke prevention - limit straining maneuver  - low dose neurontin for the numbness and pain. May try 2-3 times a day if tolerable to see if symptoms much better  - Follow up with your primary care physician for stroke risk factor modification. Recommend maintain blood pressure goal <130/80, diabetes with hemoglobin A1c goal below 6.5% and lipids with LDL cholesterol goal below 70 mg/dL.  - follow up in 6 month

## 2015-08-15 NOTE — Progress Notes (Signed)
STROKE NEUROLOGY FOLLOW UP NOTE  NAME: Paul Drake DOB: 1946-04-12  REASON FOR VISIT: stroke follow up HISTORY FROM: pt and chart  Today we had the pleasure of seeing Paul Drake in follow-up at our Neurology Clinic. Pt was accompanied by no one.   History Summary Paul Drake is a LH 69 y.o. male with a history of right MCA infarct in 2008 with residue LLE minimal weakness was admitted on 09/20/14 for acute onset left sided weakness and difficulty with speech.he received tPA. His acute right frontal stroke is adjacent to your previous stroke in 2008 shown in MRI. Stroke work up showed PFO with valsalva on TEE and positive right LE DVT. He was put on Xarelto 15mg  twice a day for 21 days and then 20mg  daily for 5 months. He mentioned that before this happened he also had some diarrhea and he might have done valsalva maneuvers then.   Patient's previous stroke was in 2008 and the patient has a resultant LLE numbness and weakness. Patient was on ASA but stopped it about a month ago due to gastritis.He denies N/V. However, shortly after the gastritis, he developed kidney stone. He may have a lot of pain which caused him to have valsalva maneuvers.   Follow up 11/24/14 - the patient has been doing well. No recurrent stroke like symptoms. He still has left hand numbness which causing trouble with writing and handling tools. He is on Xarelto daily. He followed up with his PCP closely. His BP today in clinic is 136/82. He still has OT twice a week but will soon be completed. His hypercoagulable work up in hospital was negative.   05/09/15 follow up - the patient has been doing well, no recurrent strokelike symptoms. He is still on Xarelto without side effect. However he continued to suffer from kidney stone, sometimes really painful. Urologist hesitant to do lithotripsy due to bleeding risk. He has been on Xarelto for more than 6 months now. He still reports intermittent left-sided numbness and pain,  especially fingers at left hand. No other complaints, blood pressure 125/78. TCD confirmed PFO and no MES on 30 min monitoring.  Interval History During the interval time, pt has been doing well without stroke like symptoms on plavix. His kidney stone was taken out by cystoscopy and did not require surgery. He continues to complain of left hand numbness and tingling, feeling swallow. However, he only takes once a day dose of neurontin and feels drowsiness with neurontin. BP 123/75 in clinic today.     REVIEW OF SYSTEMS: Full 14 system review of systems performed and notable only for those listed below and in HPI above, all others are negative:  Constitutional:   Cardiovascular:  Ear/Nose/Throat:   Skin:  Eyes:  Eye discharge, eye itching, light sensitivity Respiratory:   Gastroitestinal:   Genitourinary:  Hematology/Lymphatic:   Endocrine:  Musculoskeletal:  Allergy/Immunology:   Neurological:   Psychiatric:  Sleep: frequent waking, sleep talking, acting out of dreams  The following represents the patient's updated allergies and side effects list: Allergies  Allergen Reactions  . Pollen Extract     Runny nose,itchy eyes  . Seasonal Ic [Cholestatin]     Itchy eyes and runny nose    The neurologically relevant items on the patient's problem list were reviewed on today's visit.  Neurologic Examination  A problem focused neurological exam (12 or more points of the single system neurologic examination, vital signs counts as 1 point, cranial nerves count for 8 points)  was performed.  Height 5\' 8"  (1.727 m), weight 182 lb 6.4 oz (82.736 kg).  General - Well nourished, well developed, in no apparent distress.  Ophthalmologic - Sharp disc margins OU.  Cardiovascular - Regular rate and rhythm with no murmur.  Mental Status -  Level of arousal and orientation to time, place, and person were intact. Language including expression, naming, repetition, comprehension, reading, and  writing was assessed and found intact. Attention span and concentration were normal. Recent and remote memory were intact. Fund of Knowledge was assessed and was intact.  Cranial Nerves II - XII - II - Visual field intact OU. III, IV, VI - Extraocular movements intact. V - Facial sensation intact bilaterally. VII - Facial movement intact bilaterally. VIII - Hearing & vestibular intact bilaterally. X - Palate elevates symmetrically. XI - Chin turning & shoulder shrug intact bilaterally. XII - Tongue protrusion intact.  Motor Strength - The patient's strength was normal in all extremities and pronator drift was absent.  Bulk was normal and fasciculations were absent.   Motor Tone - Muscle tone was assessed at the neck and appendages and was normal.  Reflexes - The patient's reflexes were normal in all extremities and he had no pathological reflexes.  Sensory - Light touch, temperature/pinprick decreased on the left ulnar hand and all fingertips and Romberg testing were assessed and were normal.    Coordination - The patient had normal movements in the hands and feet with no ataxia or dysmetria.  Tremor was absent.  Gait and Station - The patient's transfers, posture, gait, station, and turns were observed as normal.  Data reviewed: I personally reviewed the images and agree with the radiology interpretations.  Ct Head (brain) Wo Contrast  09/20/2014 IMPRESSION: 1. No acute intracranial pathology seen on CT. 2. Chronic infarct at the high right parietal region, with associated encephalomalacia. 3. Mild cortical volume loss and scattered small vessel ischemic microangiopathy.   Carotid Doppler Bilateral: 1-39% ICA stenosis. Vertebral artery flow is antegrade.  MRI HEAD Moderate size remote infarct posterior right frontal -parietal lobe with encephalomalacia. Small to moderate size acute infarct along the anterior margin of this remote infarct involving the posterior right frontal  lobe extending to the posterior right operculum region. No intracranial hemorrhage.  MRA HEAD Decrease number of visualized right middle cerebral artery branch vessels consistent with patient's remote and acute infarct. No significant stenosis proximal to the branch vessel level. Ectatic vertebral arteries and basilar artery with slight irregularity of the distal left vertebral artery without significant stenosis. Nonvisualized right posterior inferior cerebellar artery. Mild posterior cerebral artery branch vessel irregularity and narrowing more notable on the right.  EKG normal EKG, normal sinus rhythm. For complete results please see formal report.  LE venous doppler - The right lower extremity is positive for deep vein thrombosis involving the right gastrocnemius, posterior tibial, and peroneal veins. There is no evidence of left lower extremity deep vein thrombosis.  TEE -  1) PFO by bubble study not visualized Positive bubble study only with valsalva 2) Normal EF 60% 3) No LAA thrombus 4) Trivial MR 5) Mild AR 6) No aortic debris 7) Mild TR 8) Normal RV  TCD bubble study positive with grade I at rest and grade III with valsalva.  TCD emboli detection - negative for MES.    Assessment: As you may recall, he is a 69 y.o. Caucasian male with PMH of right MCA CVA in 2008 was admitted on1/4/16 for again right MCA small stroke.  The first stroke more at right motor strip and the last stroke more at sensory strip. Stroke w/u showed positive DVT and a likely small PFO with RLS only on valsalva maneuver by TEE. However, TCD bubble study showed grade 1 RLS at rest and agrees 3 RLS with Valsalva maneuver. TCD MES negative. For the two strokes, it is likely pt had some valsalva maneuver prior to the strokes. He is on Xarelto for more than 6 months and question is whether we should keep him on it for questionable recurrent DVT and stroke. Due to potential kidney stone surgery with  bleeding risk, we optioned to switch Xarelto to plavix for stroke prevention. Low-dose Neurontin for numbness/pain. Currently, he is stable with plavix without stroke like symptoms. Still has left hand numbness tingling but only take neurontin once a day.  Plan:  - continue plavix and zocor for stroke prevention - limit straining maneuver  - continue low dose neurontin for the numbness and pain. May try 2-3 times a day if tolerable to help the symptoms - Follow up with your primary care physician for stroke risk factor modification. Recommend maintain blood pressure goal <130/80, diabetes with hemoglobin A1c goal below 6.5% and lipids with LDL cholesterol goal below 70 mg/dL.  - follow up in 6 month  No orders of the defined types were placed in this encounter.    No orders of the defined types were placed in this encounter.    Patient Instructions  - Start plavix and zocor for stroke prevention - limit straining maneuver  - low dose neurontin for the numbness and pain. May try 2-3 times a day if tolerable to see if symptoms much better  - Follow up with your primary care physician for stroke risk factor modification. Recommend maintain blood pressure goal <130/80, diabetes with hemoglobin A1c goal below 6.5% and lipids with LDL cholesterol goal below 70 mg/dL.  - follow up in 6 month    Rosalin Hawking, MD PhD [facility] [address] [place], [place] [address] [phone]

## 2015-09-14 ENCOUNTER — Other Ambulatory Visit: Payer: Self-pay | Admitting: Family Medicine

## 2015-10-14 ENCOUNTER — Other Ambulatory Visit: Payer: Self-pay | Admitting: Medical

## 2015-10-20 ENCOUNTER — Other Ambulatory Visit (INDEPENDENT_AMBULATORY_CARE_PROVIDER_SITE_OTHER): Payer: Medicare Other

## 2015-10-20 ENCOUNTER — Telehealth: Payer: Self-pay | Admitting: Family Medicine

## 2015-10-20 DIAGNOSIS — E119 Type 2 diabetes mellitus without complications: Secondary | ICD-10-CM

## 2015-10-20 DIAGNOSIS — Z0000 Encounter for general adult medical examination without abnormal findings: Secondary | ICD-10-CM | POA: Diagnosis not present

## 2015-10-20 DIAGNOSIS — E782 Mixed hyperlipidemia: Secondary | ICD-10-CM | POA: Diagnosis not present

## 2015-10-20 DIAGNOSIS — I10 Essential (primary) hypertension: Secondary | ICD-10-CM

## 2015-10-20 LAB — CBC
HCT: 40.7 % (ref 39.0–52.0)
Hemoglobin: 13.7 g/dL (ref 13.0–17.0)
MCHC: 33.6 g/dL (ref 30.0–36.0)
MCV: 96.3 fl (ref 78.0–100.0)
PLATELETS: 184 10*3/uL (ref 150.0–400.0)
RBC: 4.23 Mil/uL (ref 4.22–5.81)
RDW: 12.8 % (ref 11.5–15.5)
WBC: 5.3 10*3/uL (ref 4.0–10.5)

## 2015-10-20 LAB — COMPREHENSIVE METABOLIC PANEL
ALK PHOS: 69 U/L (ref 39–117)
ALT: 15 U/L (ref 0–53)
AST: 23 U/L (ref 0–37)
Albumin: 4.2 g/dL (ref 3.5–5.2)
BILIRUBIN TOTAL: 0.5 mg/dL (ref 0.2–1.2)
BUN: 22 mg/dL (ref 6–23)
CALCIUM: 9.7 mg/dL (ref 8.4–10.5)
CO2: 29 mEq/L (ref 19–32)
CREATININE: 1.26 mg/dL (ref 0.40–1.50)
Chloride: 104 mEq/L (ref 96–112)
GFR: 59.9 mL/min — AB (ref >60.00)
GLUCOSE: 109 mg/dL — AB (ref 70–99)
Potassium: 4.6 mEq/L (ref 3.5–5.1)
Sodium: 140 mEq/L (ref 135–145)
TOTAL PROTEIN: 7 g/dL (ref 6.0–8.3)

## 2015-10-20 LAB — HEPATITIS C ANTIBODY: HCV AB: NEGATIVE

## 2015-10-20 LAB — HEMOGLOBIN A1C: HEMOGLOBIN A1C: 5.4 % (ref 4.6–6.5)

## 2015-10-20 LAB — LIPID PANEL
Cholesterol: 158 mg/dL (ref 0–200)
HDL: 54.3 mg/dL (ref >39.00)
LDL Cholesterol: 85 mg/dL (ref 0–99)
NonHDL: 103.44
TRIGLYCERIDES: 90 mg/dL (ref 0.0–149.0)
Total CHOL/HDL Ratio: 3
VLDL: 18 mg/dL (ref 0.0–40.0)

## 2015-10-20 LAB — TSH: TSH: 2.55 u[IU]/mL (ref 0.35–4.50)

## 2015-10-20 MED ORDER — OMEPRAZOLE 40 MG PO CPDR: 40.0000 mg | DELAYED_RELEASE_CAPSULE | Freq: Two times a day (BID) | ORAL | Status: DC

## 2015-10-20 MED ORDER — AZELASTINE HCL 0.1 % NA SOLN: 2.0000 | Freq: Two times a day (BID) | NASAL | Status: DC

## 2015-10-20 NOTE — Telephone Encounter (Signed)
Caller name: Axl Relation to pt: self Call back number: [phone] Pharmacy: [id number] - [place], [place] - [address]  Reason for call: Pt states has been seeing a specialist for his throat and the specialist has been given him omniprozole 40 mg twice a day. Pt states has finished his meds and try to get refill with his pharmacist (where they contacted his specialist but did not get refill) but since was not possible, pt would like to know if he can get the rx with his PCP and is needing as well Azelastine hydrochloride nasal spray. Pt was informed that he would need an appt to do so but pt requested to send message to PCP. Pt mention if not can call him to schedule an appt. Please advise.

## 2015-10-20 NOTE — Telephone Encounter (Signed)
He has his biannual visit in 2 weeks I am fine to refill meds for now. We have done the Astelin before so we can just refill that. For the proton pump inhibitor I am not sure which one he is taking Omeprazole or Pantoprazole would be my best guesses. See if he can give you more details and I can prescribe

## 2015-10-20 NOTE — Telephone Encounter (Signed)
Patient returned my call and did send in omeprazole 40 bid #[phone] and the nose spray.  Patient was very appreciative of refills being done and will discuss at his upcoming appt.

## 2015-10-20 NOTE — Telephone Encounter (Signed)
Called left message to call back 

## 2015-10-31 ENCOUNTER — Encounter: Payer: Self-pay | Admitting: Family Medicine

## 2015-10-31 ENCOUNTER — Ambulatory Visit (INDEPENDENT_AMBULATORY_CARE_PROVIDER_SITE_OTHER): Payer: Medicare Other | Admitting: Family Medicine

## 2015-10-31 ENCOUNTER — Telehealth: Payer: Self-pay | Admitting: Family Medicine

## 2015-10-31 VITALS — BP 120/82 | HR 75 | Temp 98.1°F | Ht 68.0 in | Wt 182.4 lb

## 2015-10-31 DIAGNOSIS — R739 Hyperglycemia, unspecified: Secondary | ICD-10-CM

## 2015-10-31 DIAGNOSIS — E785 Hyperlipidemia, unspecified: Secondary | ICD-10-CM | POA: Diagnosis not present

## 2015-10-31 DIAGNOSIS — I10 Essential (primary) hypertension: Secondary | ICD-10-CM | POA: Diagnosis not present

## 2015-10-31 DIAGNOSIS — K219 Gastro-esophageal reflux disease without esophagitis: Secondary | ICD-10-CM | POA: Diagnosis not present

## 2015-10-31 MED ORDER — GABAPENTIN 100 MG PO CAPS: 200.0000 mg | ORAL_CAPSULE | Freq: Three times a day (TID) | ORAL | Status: DC

## 2015-10-31 MED ORDER — RANITIDINE HCL 300 MG PO TABS: 300.0000 mg | ORAL_TABLET | Freq: Every evening | ORAL | Status: DC | PRN

## 2015-10-31 NOTE — Assessment & Plan Note (Signed)
Well controlled, no changes to meds. Encouraged heart healthy diet such as the DASH diet and exercise as tolerated.  °

## 2015-10-31 NOTE — Assessment & Plan Note (Signed)
Tolerating statin, encouraged heart healthy diet, avoid trans fats, minimize simple carbs and saturated fats. Increase exercise as tolerated 

## 2015-10-31 NOTE — Telephone Encounter (Signed)
FYI-  Reason for call:  Patient scheduled medicare wellness for 05/01/2016

## 2015-10-31 NOTE — Telephone Encounter (Signed)
Noted  

## 2015-10-31 NOTE — Progress Notes (Signed)
Pre visit review using our clinic review tool, if applicable. No additional management support is needed unless otherwise documented below in the visit note. 

## 2015-10-31 NOTE — Patient Instructions (Signed)
  NOW company probiotics has a 10 strain capsule daily  Food Choices for Gastroesophageal Reflux Disease, Adult When you have gastroesophageal reflux disease (GERD), the foods you eat and your eating habits are very important. Choosing the right foods can help ease your discomfort.  WHAT GUIDELINES DO I NEED TO FOLLOW?   Choose fruits, vegetables, whole grains, and low-fat dairy products.   Choose low-fat meat, fish, and poultry.  Limit fats such as oils, salad dressings, butter, nuts, and avocado.   Keep a food diary. This helps you identify foods that cause symptoms.   Avoid foods that cause symptoms. These may be different for everyone.   Eat small meals often instead of 3 large meals a day.   Eat your meals slowly, in a place where you are relaxed.   Limit fried foods.   Cook foods using methods other than frying.   Avoid drinking alcohol.   Avoid drinking large amounts of liquids with your meals.   Avoid bending over or lying down until 2-3 hours after eating.  WHAT FOODS ARE NOT RECOMMENDED?  These are some foods and drinks that may make your symptoms worse: Vegetables Tomatoes. Tomato juice. Tomato and spaghetti sauce. Chili peppers. Onion and garlic. Horseradish. Fruits Oranges, grapefruit, and lemon (fruit and juice). Meats High-fat meats, fish, and poultry. This includes hot dogs, ribs, ham, sausage, salami, and bacon. Dairy Whole milk and chocolate milk. Sour cream. Cream. Butter. Ice cream. Cream cheese.  Drinks Coffee and tea. Bubbly (carbonated) drinks or energy drinks. Condiments Hot sauce. Barbecue sauce.  Sweets/Desserts Chocolate and cocoa. Donuts. Peppermint and spearmint. Fats and Oils High-fat foods. This includes [place] fries and potato chips. Other Vinegar. Strong spices. This includes black pepper, white pepper, red pepper, cayenne, curry powder, cloves, ginger, and chili powder. The items listed above may not be a complete list of  foods and drinks to avoid. Contact your dietitian for more information.   This information is not intended to replace advice given to you by your health care provider. Make sure you discuss any questions you have with your health care provider.   Document Released: 03/04/2012 Document Revised: 09/24/2014 Document Reviewed: 07/08/2013 Elsevier Interactive Patient Education [employer].

## 2015-11-02 ENCOUNTER — Telehealth: Payer: Self-pay | Admitting: Family Medicine

## 2015-11-02 NOTE — Telephone Encounter (Signed)
Covering for Dr. Charlett Blake who is out of office. He needs to schedule appointment with her.

## 2015-11-02 NOTE — Telephone Encounter (Signed)
FYI. Please advise.

## 2015-11-02 NOTE — Telephone Encounter (Signed)
Pt was seen on 2/13, He says that he is having frequent previsit dreams that are now causing him to jump out of bed. The last one pt says that he hit his head. Pt says that he forgot to mention to PCP at his appt. Pt would like to be advised. If an appt is needed please advise and I will be happy to FU with pt to schedule.     CB: [id number].

## 2015-11-04 NOTE — Telephone Encounter (Signed)
Scheduled pt

## 2015-11-06 NOTE — Assessment & Plan Note (Signed)
Avoid offending foods, start probiotics. Do not eat large meals in late evening and consider raising head of bed. Continue current meds 

## 2015-11-06 NOTE — Progress Notes (Signed)
Patient ID: Paul Drake, male   DOB: 1946/07/12, 70 y.o.   MRN: [id number]   Subjective:    Patient ID: Paul Drake, male    DOB: 28-Dec-1945, 70 y.o.   MRN: [id number]  Chief Complaint  Patient presents with  . Follow-up    HPI Patient is in today for follow-up. Is generally doing well but is very frustrated with his persistent left hand weakness and intermittent swelling ever since his stroke. He does acknowledge it slowly getting better. He also has some intermittent heartburn and soreness in his throat but this comes and goes. Denies CP/palp/SOB/HA/congestion/fevers/GI or GU c/o. Taking meds as prescribed  Past Medical History  Diagnosis Date  . Kidney stones   . CVA (cerebral infarction)   . Depression   . Hyperlipemia   . Hypertension   . Chicken pox   . Seasonal allergies     some asthma  . Measles as a child  . Mumps as a child  . Allergic state 12/29/2013  . Unspecified asthma(493.90) 12/29/2013  . Arthritis   . IBS (irritable bowel syndrome)   . Otitis externa 12/10/2013  . Hyperglycemia 01/06/2015  . HTN (hypertension), benign 04/01/2015  . Medicare annual wellness visit, subsequent 06/26/2015  . Stroke ([place])   . GERD (gastroesophageal reflux disease)     Past Surgical History  Procedure Laterality Date  . Lithotripsy      multiple times  . Wisdom tooth extraction    . Colonoscopy    . Tee without cardioversion N/A 09/22/2014    Procedure: TRANSESOPHAGEAL ECHOCARDIOGRAM (TEE);  Surgeon: Josue Hector, MD;  Location: [facility] ENDOSCOPY;  Service: Cardiovascular;  Laterality: N/A;    Family History  Problem Relation Age of Onset  . Hypertension Mother   . Hyperlipidemia Mother   . Fibromyalgia Mother   . Arthritis Mother     rheumatoid  . Diabetes Sister     type 2  . Hyperlipidemia Brother   . Hypertension Brother   . Ulcers Father 36    Bleeding Ulcers  . Cancer Maternal Uncle     prostate  . Kidney Stones Daughter   . Asthma Daughter   . Healthy Son     . Cancer Maternal Grandfather     skin ?    Social History   Social History  . Marital Status: Married    Spouse Name: Webb Silversmith  . Number of Children: 2  . Years of Education: college   Occupational History  .      retired   Social History Main Topics  . Smoking status: Never Smoker   . Smokeless tobacco: Never Used  . Alcohol Use: 0.0 oz/week    0 Standard drinks or equivalent per week     Comment: 1-2 occasioinal  . Drug Use: No  . 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 Not on file   Social History Narrative   Patient consumes 2 cups of caffeine daily    Outpatient Prescriptions Prior to Visit  Medication Sig Dispense Refill  . albuterol (PROVENTIL HFA;VENTOLIN HFA) 108 (90 BASE) MCG/ACT inhaler Inhale 2 puffs into the lungs every 6 (six) hours as needed for wheezing or shortness of breath. 1 Inhaler 3  . azelastine (ASTELIN) 0.1 % nasal spray Place 2 sprays into both nostrils 2 (two) times daily. Use in each nostril as directed 30 mL 2  . Cetirizine HCl (ZYRTEC  ALLERGY PO) Take 10 mg by mouth daily.     . citalopram (CELEXA) 20 MG tablet TAKE 1 TABLET(20 MG) BY MOUTH DAILY 30 tablet 2  . clopidogrel (PLAVIX) 75 MG tablet Take 1 tablet (75 mg total) by mouth daily. 90 tablet 3  . fluticasone (FLONASE) 50 MCG/ACT nasal spray SHAKE WELL AND USE 2 SPRAYS IN EACH NOSTRIL DAILY 16 g 0  . losartan (COZAAR) 100 MG tablet Take 1 tablet (100 mg total) by mouth daily. 90 tablet 3  . montelukast (SINGULAIR) 10 MG tablet Take 1 tablet (10 mg total) by mouth at bedtime as needed. 30 tablet 3  . Multiple Vitamins-Minerals (MULTIVITAMINS THER. W/MINERALS) TABS tablet Take 1 tablet by mouth daily.    . Omega-3 Fatty Acids (OMEGA-3 FISH OIL) 1200 MG CAPS Take 1,200 mg by mouth daily.    Marland Kitchen omeprazole (PRILOSEC) 40 MG capsule Take 1 capsule (40 mg total) by mouth 2 (two) times daily. 180 capsule 0  .  simvastatin (ZOCOR) 20 MG tablet Take 1 tablet (20 mg total) by mouth daily. 30 tablet 6  . gabapentin (NEURONTIN) 100 MG capsule Take 1 capsule (100 mg total) by mouth 3 (three) times daily. 90 capsule 2  . ranitidine (ZANTAC) 150 MG tablet Take 1 tablet (150 mg total) by mouth 2 (two) times daily. 30 tablet 5   No facility-administered medications prior to visit.    Allergies  Allergen Reactions  . Pollen Extract     Runny nose,itchy eyes  . Seasonal Ic [Cholestatin]     Itchy eyes and runny nose    Review of Systems  Constitutional: Negative for fever and malaise/fatigue.  HENT: Positive for sore throat. Negative for congestion.   Eyes: Negative for discharge.  Respiratory: Negative for shortness of breath.   Cardiovascular: Negative for chest pain, palpitations and leg swelling.  Gastrointestinal: Positive for heartburn. Negative for nausea and abdominal pain.  Genitourinary: Negative for dysuria.  Musculoskeletal: Positive for joint pain. Negative for falls.  Skin: Negative for rash.  Neurological: Positive for focal weakness. Negative for loss of consciousness and headaches.  Endo/Heme/Allergies: Negative for environmental allergies.  Psychiatric/Behavioral: Negative for depression. The patient is not nervous/anxious.        Objective:    Physical Exam  Constitutional: He is oriented to person, place, and time. He appears well-developed and well-nourished. No distress.  HENT:  Head: Normocephalic and atraumatic.  Nose: Nose normal.  Eyes: Right eye exhibits no discharge. Left eye exhibits no discharge.  Neck: Normal range of motion. Neck supple.  Cardiovascular: Normal rate and regular rhythm.   No murmur heard. Pulmonary/Chest: Effort normal and breath sounds normal.  Abdominal: Soft. Bowel sounds are normal. There is no tenderness.  Musculoskeletal: He exhibits no edema.  Neurological: He is alert and oriented to person, place, and time.  Skin: Skin is warm and  dry.  Psychiatric: He has a normal mood and affect.  Nursing note and vitals reviewed.   BP 120/82 mmHg  Pulse 75  Temp(Src) 98.1 F (36.7 C) (Oral)  Ht 5\' 8"  (1.727 m)  Wt 182 lb 6 oz (82.725 kg)  BMI 27.74 kg/m2  SpO2 93% Wt Readings from Last 3 Encounters:  10/31/15 182 lb 6 oz (82.725 kg)  08/15/15 182 lb 6.4 oz (82.736 kg)  06/16/15 178 lb 4 oz (80.854 kg)     Lab Results  Component Value Date   WBC 5.3 10/20/2015   HGB 13.7 10/20/2015   HCT 40.7 10/20/2015  PLT 184.0 10/20/2015   GLUCOSE 109* 10/20/2015   CHOL 158 10/20/2015   TRIG 90.0 10/20/2015   HDL 54.30 10/20/2015   LDLDIRECT 76.0 12/31/2014   LDLCALC 85 10/20/2015   ALT 15 10/20/2015   AST 23 10/20/2015   NA 140 10/20/2015   K 4.6 10/20/2015   CL 104 10/20/2015   CREATININE 1.26 10/20/2015   BUN 22 10/20/2015   CO2 29 10/20/2015   TSH 2.55 10/20/2015   INR 0.98 09/20/2014   HGBA1C 5.4 10/20/2015    Lab Results  Component Value Date   TSH 2.55 10/20/2015   Lab Results  Component Value Date   WBC 5.3 10/20/2015   HGB 13.7 10/20/2015   HCT 40.7 10/20/2015   MCV 96.3 10/20/2015   PLT 184.0 10/20/2015   Lab Results  Component Value Date   NA 140 10/20/2015   K 4.6 10/20/2015   CO2 29 10/20/2015   GLUCOSE 109* 10/20/2015   BUN 22 10/20/2015   CREATININE 1.26 10/20/2015   BILITOT 0.5 10/20/2015   ALKPHOS 69 10/20/2015   AST 23 10/20/2015   ALT 15 10/20/2015   PROT 7.0 10/20/2015   ALBUMIN 4.2 10/20/2015   CALCIUM 9.7 10/20/2015   ANIONGAP 8 09/20/2014   GFR 59.90* 10/20/2015   Lab Results  Component Value Date   CHOL 158 10/20/2015   Lab Results  Component Value Date   HDL 54.30 10/20/2015   Lab Results  Component Value Date   LDLCALC 85 10/20/2015   Lab Results  Component Value Date   TRIG 90.0 10/20/2015   Lab Results  Component Value Date   CHOLHDL 3 10/20/2015   Lab Results  Component Value Date   HGBA1C 5.4 10/20/2015       Assessment & Plan:   Problem  List Items Addressed This Visit    Essential hypertension - Primary    Well controlled, no changes to meds. Encouraged heart healthy diet such as the DASH diet and exercise as tolerated.       GERD (gastroesophageal reflux disease)    Avoid offending foods, start probiotics. Do not eat large meals in late evening and consider raising head of bed. Continue current meds.       Relevant Medications   ranitidine (ZANTAC) 300 MG tablet   HTN (hypertension), benign    Well controlled, no changes to meds. Encouraged heart healthy diet such as the DASH diet and exercise as tolerated.       Hyperglycemia    minimize simple carbs. Increase exercise as tolerated.       Hyperlipidemia    Tolerating statin, encouraged heart healthy diet, avoid trans fats, minimize simple carbs and saturated fats. Increase exercise as tolerated         I have discontinued Mr. Foerster's ranitidine. I have also changed his gabapentin. Additionally, I am having him start on ranitidine. Lastly, I am having him maintain his Cetirizine HCl (ZYRTEC ALLERGY PO), multivitamins ther. w/minerals, Omega-3 Fish Oil, albuterol, montelukast, clopidogrel, simvastatin, losartan, citalopram, fluticasone, omeprazole, and azelastine.  Meds ordered this encounter  Medications  . gabapentin (NEURONTIN) 100 MG capsule    Sig: Take 2 capsules (200 mg total) by mouth 3 (three) times daily.    Dispense:  180 capsule    Refill:  3  . ranitidine (ZANTAC) 300 MG tablet    Sig: Take 1 tablet (300 mg total) by mouth at bedtime as needed for heartburn.    Dispense:  30 tablet  Refill:  5     Penni Homans, MD

## 2015-11-06 NOTE — Assessment & Plan Note (Signed)
Well controlled, no changes to meds. Encouraged heart healthy diet such as the DASH diet and exercise as tolerated.  °

## 2015-11-06 NOTE — Assessment & Plan Note (Signed)
minimize simple carbs. Increase exercise as tolerated.  

## 2015-11-08 ENCOUNTER — Ambulatory Visit (INDEPENDENT_AMBULATORY_CARE_PROVIDER_SITE_OTHER): Payer: Medicare Other | Admitting: Family Medicine

## 2015-11-08 ENCOUNTER — Encounter: Payer: Self-pay | Admitting: Family Medicine

## 2015-11-08 VITALS — BP 132/71 | HR 58 | Temp 98.0°F | Ht 68.0 in | Wt 183.4 lb

## 2015-11-08 DIAGNOSIS — I639 Cerebral infarction, unspecified: Secondary | ICD-10-CM

## 2015-11-08 DIAGNOSIS — F418 Other specified anxiety disorders: Secondary | ICD-10-CM

## 2015-11-08 DIAGNOSIS — I10 Essential (primary) hypertension: Secondary | ICD-10-CM

## 2015-11-08 DIAGNOSIS — F32A Depression, unspecified: Secondary | ICD-10-CM

## 2015-11-08 DIAGNOSIS — R739 Hyperglycemia, unspecified: Secondary | ICD-10-CM

## 2015-11-08 DIAGNOSIS — E785 Hyperlipidemia, unspecified: Secondary | ICD-10-CM

## 2015-11-08 DIAGNOSIS — F329 Major depressive disorder, single episode, unspecified: Secondary | ICD-10-CM | POA: Diagnosis not present

## 2015-11-08 MED ORDER — FLUOXETINE HCL 20 MG PO TABS: 20.0000 mg | ORAL_TABLET | Freq: Every day | ORAL | Status: DC

## 2015-11-08 NOTE — Progress Notes (Signed)
Pre visit review using our clinic review tool, if applicable. No additional management support is needed unless otherwise documented below in the visit note. 

## 2015-11-08 NOTE — Assessment & Plan Note (Signed)
Well controlled, no changes to meds. Encouraged heart healthy diet such as the DASH diet and exercise as tolerated.  °

## 2015-11-08 NOTE — Assessment & Plan Note (Signed)
hgba1c acceptable, minimize simple carbs. Increase exercise as tolerated. Continue current meds 

## 2015-11-08 NOTE — Assessment & Plan Note (Signed)
Follows with [facility] due for a follow up visit with Dr Erlinda Hong soon. Only new complaint is worsening bad very vivid dreams, he has reported it has been worsening since the stroke

## 2015-11-20 NOTE — Assessment & Plan Note (Addendum)
Struggling with worse dreams but would like to try a trial of no SSRI, Celexa discontinued if worsens will need to consider a different med. Encouraged to consider counseling. 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

## 2015-11-20 NOTE — Assessment & Plan Note (Signed)
Tolerating statin, encouraged heart healthy diet, avoid trans fats, minimize simple carbs and saturated fats. Increase exercise as tolerated 

## 2015-11-20 NOTE — Progress Notes (Signed)
Patient ID: Paul Drake, male   DOB: 08/11/1946, 70 y.o.   MRN: [id number]   Subjective:    Patient ID: Paul Drake, male    DOB: Oct 11, 1945, 70 y.o.   MRN: [id number]  Chief Complaint  Patient presents with  . Follow-up    dreams    HPI Patient is in today for Follow-up. His biggest concern is worsening violent dreams. He's been having them off and on for almost 10 years but lately they have become more frequent and intense. He's had no self-harm as a result. He struggles with anxiety and depression but feels these are manageable at this time. Would like to try a trial off of Celexa. No other acute concerns. Denies CP/palp/SOB/HA/congestion/fevers/GI or GU c/o. Taking meds as prescribed  Past Medical History  Diagnosis Date  . Kidney stones   . CVA (cerebral infarction)   . Depression   . Hyperlipemia   . Hypertension   . Chicken pox   . Seasonal allergies     some asthma  . Measles as a child  . Mumps as a child  . Allergic state 12/29/2013  . Unspecified asthma(493.90) 12/29/2013  . Arthritis   . IBS (irritable bowel syndrome)   . Otitis externa 12/10/2013  . Hyperglycemia 01/06/2015  . HTN (hypertension), benign 04/01/2015  . Medicare annual wellness visit, subsequent 06/26/2015  . Stroke ([place])   . GERD (gastroesophageal reflux disease)     Past Surgical History  Procedure Laterality Date  . Lithotripsy      multiple times  . Wisdom tooth extraction    . Colonoscopy    . Tee without cardioversion N/A 09/22/2014    Procedure: TRANSESOPHAGEAL ECHOCARDIOGRAM (TEE);  Surgeon: Josue Hector, MD;  Location: [facility] ENDOSCOPY;  Service: Cardiovascular;  Laterality: N/A;    Family History  Problem Relation Age of Onset  . Hypertension Mother   . Hyperlipidemia Mother   . Fibromyalgia Mother   . Arthritis Mother     rheumatoid  . Diabetes Sister     type 2  . Hyperlipidemia Brother   . Hypertension Brother   . Ulcers Father 36    Bleeding Ulcers  . Cancer Maternal Uncle      prostate  . Kidney Stones Daughter   . Asthma Daughter   . Healthy Son   . Cancer Maternal Grandfather     skin ?    Social History   Social History  . Marital Status: Married    Spouse Name: Webb Silversmith  . Number of Children: 2  . Years of Education: college   Occupational History  .      retired   Social History Main Topics  . Smoking status: Never Smoker   . Smokeless tobacco: Never Used  . Alcohol Use: 0.0 oz/week    0 Standard drinks or equivalent per week     Comment: 1-2 occasioinal  . Drug Use: No  . 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 Not on file   Social History Narrative   Patient consumes 2 cups of caffeine daily    Outpatient Prescriptions Prior to Visit  Medication Sig Dispense Refill  . albuterol (PROVENTIL HFA;VENTOLIN HFA) 108 (90 BASE) MCG/ACT inhaler Inhale 2 puffs into the lungs every 6 (six) hours as needed for wheezing or shortness of breath. 1 Inhaler 3  . azelastine (ASTELIN) 0.1 % nasal spray Place 2 sprays into  both nostrils 2 (two) times daily. Use in each nostril as directed 30 mL 2  . Cetirizine HCl (ZYRTEC ALLERGY PO) Take 10 mg by mouth daily.     . clopidogrel (PLAVIX) 75 MG tablet Take 1 tablet (75 mg total) by mouth daily. 90 tablet 3  . fluticasone (FLONASE) 50 MCG/ACT nasal spray SHAKE WELL AND USE 2 SPRAYS IN EACH NOSTRIL DAILY 16 g 0  . gabapentin (NEURONTIN) 100 MG capsule Take 2 capsules (200 mg total) by mouth 3 (three) times daily. 180 capsule 3  . losartan (COZAAR) 100 MG tablet Take 1 tablet (100 mg total) by mouth daily. 90 tablet 3  . montelukast (SINGULAIR) 10 MG tablet Take 1 tablet (10 mg total) by mouth at bedtime as needed. 30 tablet 3  . Multiple Vitamins-Minerals (MULTIVITAMINS THER. W/MINERALS) TABS tablet Take 1 tablet by mouth daily.    . Omega-3 Fatty Acids (OMEGA-3 FISH OIL) 1200 MG CAPS Take 1,200 mg by mouth daily.    Marland Kitchen  omeprazole (PRILOSEC) 40 MG capsule Take 1 capsule (40 mg total) by mouth 2 (two) times daily. 180 capsule 0  . ranitidine (ZANTAC) 300 MG tablet Take 1 tablet (300 mg total) by mouth at bedtime as needed for heartburn. 30 tablet 5  . simvastatin (ZOCOR) 20 MG tablet Take 1 tablet (20 mg total) by mouth daily. 30 tablet 6  . citalopram (CELEXA) 20 MG tablet TAKE 1 TABLET(20 MG) BY MOUTH DAILY 30 tablet 2   No facility-administered medications prior to visit.    Allergies  Allergen Reactions  . Pollen Extract     Runny nose,itchy eyes  . Seasonal Ic [Cholestatin]     Itchy eyes and runny nose    Review of Systems  Constitutional: Positive for malaise/fatigue. Negative for fever.  HENT: Negative for congestion.   Eyes: Negative for discharge.  Respiratory: Negative for shortness of breath.   Cardiovascular: Negative for chest pain, palpitations and leg swelling.  Gastrointestinal: Negative for nausea and abdominal pain.  Genitourinary: Negative for dysuria.  Musculoskeletal: Negative for falls.  Skin: Negative for rash.  Neurological: Negative for loss of consciousness and headaches.  Endo/Heme/Allergies: Negative for environmental allergies.  Psychiatric/Behavioral: Positive for depression. The patient is nervous/anxious.        Objective:    Physical Exam  Constitutional: He is oriented to person, place, and time. He appears well-developed and well-nourished. No distress.  HENT:  Head: Normocephalic and atraumatic.  Nose: Nose normal.  Eyes: Right eye exhibits no discharge. Left eye exhibits no discharge.  Neck: Normal range of motion. Neck supple.  Cardiovascular: Normal rate and regular rhythm.   No murmur heard. Pulmonary/Chest: Effort normal and breath sounds normal.  Abdominal: Soft. Bowel sounds are normal. There is no tenderness.  Musculoskeletal: He exhibits no edema.  Neurological: He is alert and oriented to person, place, and time.  Skin: Skin is warm and  dry.  Psychiatric: He has a normal mood and affect.  Nursing note and vitals reviewed.   BP 132/71 mmHg  Pulse 58  Temp(Src) 98 F (36.7 C) (Oral)  Ht 5\' 8"  (1.727 m)  Wt 183 lb 6 oz (83.178 kg)  BMI 27.89 kg/m2  SpO2 98% Wt Readings from Last 3 Encounters:  11/08/15 183 lb 6 oz (83.178 kg)  10/31/15 182 lb 6 oz (82.725 kg)  08/15/15 182 lb 6.4 oz (82.736 kg)     Lab Results  Component Value Date   WBC 5.3 10/20/2015   HGB 13.7  10/20/2015   HCT 40.7 10/20/2015   PLT 184.0 10/20/2015   GLUCOSE 109* 10/20/2015   CHOL 158 10/20/2015   TRIG 90.0 10/20/2015   HDL 54.30 10/20/2015   LDLDIRECT 76.0 12/31/2014   LDLCALC 85 10/20/2015   ALT 15 10/20/2015   AST 23 10/20/2015   NA 140 10/20/2015   K 4.6 10/20/2015   CL 104 10/20/2015   CREATININE 1.26 10/20/2015   BUN 22 10/20/2015   CO2 29 10/20/2015   TSH 2.55 10/20/2015   INR 0.98 09/20/2014   HGBA1C 5.4 10/20/2015    Lab Results  Component Value Date   TSH 2.55 10/20/2015   Lab Results  Component Value Date   WBC 5.3 10/20/2015   HGB 13.7 10/20/2015   HCT 40.7 10/20/2015   MCV 96.3 10/20/2015   PLT 184.0 10/20/2015   Lab Results  Component Value Date   NA 140 10/20/2015   K 4.6 10/20/2015   CO2 29 10/20/2015   GLUCOSE 109* 10/20/2015   BUN 22 10/20/2015   CREATININE 1.26 10/20/2015   BILITOT 0.5 10/20/2015   ALKPHOS 69 10/20/2015   AST 23 10/20/2015   ALT 15 10/20/2015   PROT 7.0 10/20/2015   ALBUMIN 4.2 10/20/2015   CALCIUM 9.7 10/20/2015   ANIONGAP 8 09/20/2014   GFR 59.90* 10/20/2015   Lab Results  Component Value Date   CHOL 158 10/20/2015   Lab Results  Component Value Date   HDL 54.30 10/20/2015   Lab Results  Component Value Date   LDLCALC 85 10/20/2015   Lab Results  Component Value Date   TRIG 90.0 10/20/2015   Lab Results  Component Value Date   CHOLHDL 3 10/20/2015   Lab Results  Component Value Date   HGBA1C 5.4 10/20/2015       Assessment & Plan:   Problem  List Items Addressed This Visit    Depression with anxiety    Struggling with worse dreams but would like to try a trial of no SSRI, Celexa discontinued if worsens will need to consider a different med. Encouraged to consider counseling. 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      Relevant Medications   FLUoxetine (PROZAC) 20 MG tablet   HTN (hypertension), benign    Well controlled, no changes to meds. Encouraged heart healthy diet such as the DASH diet and exercise as tolerated.       Hyperglycemia    hgba1c acceptable, minimize simple carbs. Increase exercise as tolerated. Continue current meds      Hyperlipidemia    Tolerating statin, encouraged heart healthy diet, avoid trans fats, minimize simple carbs and saturated fats. Increase exercise as tolerated      Stroke ([place]) - Primary    Follows with [facility] due for a follow up visit with Dr Erlinda Hong soon. Only new complaint is worsening bad very vivid dreams, he has reported it has been worsening since the stroke       Other Visit Diagnoses    Depression        Relevant Medications    FLUoxetine (PROZAC) 20 MG tablet       I have discontinued Mr. Gaffey's citalopram. I am also having him start on FLUoxetine. Additionally, I am having him maintain his Cetirizine HCl (ZYRTEC ALLERGY PO), multivitamins ther. w/minerals, Omega-3 Fish Oil, albuterol, montelukast, clopidogrel, simvastatin, losartan, fluticasone, omeprazole, azelastine, gabapentin, and ranitidine.  Meds ordered this encounter  Medications  . FLUoxetine (  PROZAC) 20 MG tablet    Sig: Take 1 tablet (20 mg total) by mouth daily.    Dispense:  30 tablet    Refill:  3     Penni Homans, MD

## 2015-11-22 ENCOUNTER — Telehealth: Payer: Self-pay | Admitting: Family Medicine

## 2015-11-22 NOTE — Telephone Encounter (Signed)
Please continue the Prozac, please call insurance and confirm what we need to do. OK to switch to cap at same strength

## 2015-11-22 NOTE — Telephone Encounter (Signed)
Caller name: Self  Can be reached: [phone]  Reason for call: Patient called stating that since he has been on Prozac he has not had any violent dreams and he would like to stay on the medication. States that his [employer] will only continue to pay for the medication if the PCP calls and suggest that he does. The [place] number is [phone] that needs to be called. States that Lynda Rainwater will approve the capsule but not the tablet.

## 2015-12-13 ENCOUNTER — Telehealth: Payer: Self-pay | Admitting: Family Medicine

## 2015-12-13 DIAGNOSIS — F32A Depression, unspecified: Secondary | ICD-10-CM

## 2015-12-13 DIAGNOSIS — F329 Major depressive disorder, single episode, unspecified: Secondary | ICD-10-CM

## 2015-12-13 MED ORDER — FLUOXETINE HCL 20 MG PO TABS: 20.0000 mg | ORAL_TABLET | Freq: Every day | ORAL | Status: DC

## 2015-12-13 NOTE — Telephone Encounter (Signed)
Caller name: Self  Can be reached: [phone]   Reason for call: Patient has been waiting on a call back since 3/7 about his Prozac. States he is out and no one has called and given him any information. Patient has no medication left to take.

## 2015-12-13 NOTE — Telephone Encounter (Signed)
Sent the patients prescription into [facility] [address].  Called the patient to inform and had to leave detailed message (two) that prescription has been filled

## 2015-12-14 MED ORDER — FLUOXETINE HCL 20 MG PO CAPS: 20.0000 mg | ORAL_CAPSULE | Freq: Every day | ORAL | Status: DC

## 2015-12-14 NOTE — Telephone Encounter (Signed)
Pt states that the prozac needed to be changed to capsule but same dose.  Pt asking if we can call them to change. Please call pt to notify him this has been done. He states he called yesterday and was assured it would be taken care of. He is out of his medication.

## 2015-12-14 NOTE — Addendum Note (Signed)
Addended by: Dorrene German on: 12/14/2015 12:11 PM   Modules accepted: Orders, Medications

## 2015-12-14 NOTE — Telephone Encounter (Addendum)
Pt's PCP is out of the office today. Ok per [place], [place] to change Prozac to capsule. Medication filled to pharmacy as requested. Called pharmacy to cancel previous rx. Pt notified.

## 2015-12-20 ENCOUNTER — Other Ambulatory Visit: Payer: Self-pay | Admitting: Family Medicine

## 2016-01-05 ENCOUNTER — Telehealth: Payer: Self-pay | Admitting: Family Medicine

## 2016-01-05 MED ORDER — CITALOPRAM HYDROBROMIDE 20 MG PO TABS: 20.0000 mg | ORAL_TABLET | Freq: Every day | ORAL | Status: DC

## 2016-01-05 NOTE — Telephone Encounter (Signed)
Relation to [id number] Call back [phone]  Pharmacy: [id number] - [place], [place] - [address] AT [address] [phone] (Phone) [phone] (Fax)         Reason for call:  Patient states FLUoxetine (PROZAC) 20 MG capsule is not working and he's wife noticed a change in he's behavior regarding he's patience and would like to resume back to taking citalopram (CELEXA) 20 MG tablet. Please advise

## 2016-01-05 NOTE — Telephone Encounter (Signed)
Spoke with pcp and advised her of the change. Pt has a follow up appointment on 02/16/16 at 1115 am.

## 2016-01-05 NOTE — Telephone Encounter (Signed)
Advise on this change in medication.

## 2016-01-17 DIAGNOSIS — N200 Calculus of kidney: Secondary | ICD-10-CM | POA: Diagnosis not present

## 2016-01-31 DIAGNOSIS — Z08 Encounter for follow-up examination after completed treatment for malignant neoplasm: Secondary | ICD-10-CM | POA: Diagnosis not present

## 2016-01-31 DIAGNOSIS — L821 Other seborrheic keratosis: Secondary | ICD-10-CM | POA: Diagnosis not present

## 2016-01-31 DIAGNOSIS — L578 Other skin changes due to chronic exposure to nonionizing radiation: Secondary | ICD-10-CM | POA: Diagnosis not present

## 2016-01-31 DIAGNOSIS — L570 Actinic keratosis: Secondary | ICD-10-CM | POA: Diagnosis not present

## 2016-01-31 DIAGNOSIS — Z85828 Personal history of other malignant neoplasm of skin: Secondary | ICD-10-CM | POA: Diagnosis not present

## 2016-02-03 ENCOUNTER — Encounter: Payer: Self-pay | Admitting: Physician Assistant

## 2016-02-03 ENCOUNTER — Ambulatory Visit (INDEPENDENT_AMBULATORY_CARE_PROVIDER_SITE_OTHER): Payer: Medicare Other | Admitting: Physician Assistant

## 2016-02-03 ENCOUNTER — Encounter (HOSPITAL_BASED_OUTPATIENT_CLINIC_OR_DEPARTMENT_OTHER): Payer: Self-pay

## 2016-02-03 ENCOUNTER — Ambulatory Visit (HOSPITAL_BASED_OUTPATIENT_CLINIC_OR_DEPARTMENT_OTHER)
Admission: RE | Admit: 2016-02-03 | Discharge: 2016-02-03 | Disposition: A | Discharge status: Home / Self Care | Payer: Medicare Other | Source: Ambulatory Visit | Attending: Physician Assistant | Admitting: Physician Assistant

## 2016-02-03 VITALS — BP 102/58 | HR 73 | Temp 98.1°F | Ht 68.0 in | Wt 178.2 lb

## 2016-02-03 DIAGNOSIS — R1033 Periumbilical pain: Secondary | ICD-10-CM

## 2016-02-03 DIAGNOSIS — N200 Calculus of kidney: Secondary | ICD-10-CM | POA: Insufficient documentation

## 2016-02-03 DIAGNOSIS — K760 Fatty (change of) liver, not elsewhere classified: Secondary | ICD-10-CM | POA: Insufficient documentation

## 2016-02-03 DIAGNOSIS — R1013 Epigastric pain: Secondary | ICD-10-CM | POA: Insufficient documentation

## 2016-02-03 DIAGNOSIS — I639 Cerebral infarction, unspecified: Secondary | ICD-10-CM | POA: Diagnosis not present

## 2016-02-03 LAB — URINALYSIS, MICROSCOPIC ONLY: RBC / HPF: NONE SEEN (ref 0–?)

## 2016-02-03 LAB — CBC WITH DIFFERENTIAL/PLATELET
BASOS ABS: 0.1 10*3/uL (ref 0.0–0.1)
Basophils Relative: 0.9 % (ref 0.0–3.0)
EOS ABS: 0.3 10*3/uL (ref 0.0–0.7)
Eosinophils Relative: 5.4 % — ABNORMAL HIGH (ref 0.0–5.0)
HCT: 38.5 % — ABNORMAL LOW (ref 39.0–52.0)
Hemoglobin: 13.1 g/dL (ref 13.0–17.0)
LYMPHS ABS: 1.4 10*3/uL (ref 0.7–4.0)
Lymphocytes Relative: 23.7 % (ref 12.0–46.0)
MCHC: 34 g/dL (ref 30.0–36.0)
MCV: 95.3 fl (ref 78.0–100.0)
MONO ABS: 0.6 10*3/uL (ref 0.1–1.0)
Monocytes Relative: 11.2 % (ref 3.0–12.0)
NEUTROS ABS: 3.4 10*3/uL (ref 1.4–7.7)
NEUTROS PCT: 58.8 % (ref 43.0–77.0)
PLATELETS: 174 10*3/uL (ref 150.0–400.0)
RBC: 4.04 Mil/uL — ABNORMAL LOW (ref 4.22–5.81)
RDW: 12.5 % (ref 11.5–15.5)
WBC: 5.7 10*3/uL (ref 4.0–10.5)

## 2016-02-03 LAB — COMPREHENSIVE METABOLIC PANEL
ALK PHOS: 69 U/L (ref 39–117)
ALT: 16 U/L (ref 0–53)
AST: 22 U/L (ref 0–37)
Albumin: 4.3 g/dL (ref 3.5–5.2)
BUN: 17 mg/dL (ref 6–23)
CO2: 29 meq/L (ref 19–32)
Calcium: 9.5 mg/dL (ref 8.4–10.5)
Chloride: 104 mEq/L (ref 96–112)
Creatinine, Ser: 1.1 mg/dL (ref 0.40–1.50)
GFR: 70 mL/min (ref >60.00)
GLUCOSE: 67 mg/dL — AB (ref 70–99)
Potassium: 4.1 mEq/L (ref 3.5–5.1)
SODIUM: 138 meq/L (ref 135–145)
Total Bilirubin: 0.7 mg/dL (ref 0.2–1.2)
Total Protein: 6.7 g/dL (ref 6.0–8.3)

## 2016-02-03 LAB — POC URINALSYSI DIPSTICK (AUTOMATED)
Bilirubin, UA: NEGATIVE
Blood, UA: NEGATIVE
Glucose, UA: NEGATIVE
KETONES UA: NEGATIVE
Nitrite, UA: NEGATIVE
PROTEIN UA: NEGATIVE
Spec Grav, UA: 1.02
Urobilinogen, UA: 0.2
pH, UA: 6

## 2016-02-03 LAB — LIPASE: Lipase: 30 U/L (ref 11.0–59.0)

## 2016-02-03 MED ORDER — IOPAMIDOL (ISOVUE-300) INJECTION 61%
100.0000 mL | Freq: Once | INTRAVENOUS | Status: AC | PRN
  Administered 2016-02-03: 100 mL via INTRAVENOUS

## 2016-02-03 NOTE — Progress Notes (Signed)
Patient presents to clinic today c/o intermittent abdominal pain over the past 2 weeks described as bloating and cramping that is generalized. Endorses worse at night time when laying down. Also hurts if laying on his stomach. Endorses recent passing of kidney stone about 2 weeks. Has follow-up scheduled with Urology for repeat CT to assess for further stones.  Denies fever, chills, malaise. Endorses decreased bowel frequency. Last BM this morning. Denies pain with BM. Denies blood in stool, melena. Endorses intermittent nausea but denies vomiting.  Has history of GERD and Barrett's esophagus, currently on Prilosec 40 mg daily and Zantac 300 mg nightly. Endorses only taking Prilosec as he did not find any improvement with Ranitidine. Endorses in the past 2 days symptoms have improved but only mildly so. Does note urinary urgency and frequency. Denies hematuria.   Past Medical History  Diagnosis Date  . Kidney stones   . CVA (cerebral infarction)   . Depression   . Hyperlipemia   . Hypertension   . Chicken pox   . Seasonal allergies     some asthma  . Measles as a child  . Mumps as a child  . Allergic state 12/29/2013  . Unspecified asthma(493.90) 12/29/2013  . Arthritis   . IBS (irritable bowel syndrome)   . Otitis externa 12/10/2013  . Hyperglycemia 01/06/2015  . HTN (hypertension), benign 04/01/2015  . Medicare annual wellness visit, subsequent 06/26/2015  . Stroke ([place])   . GERD (gastroesophageal reflux disease)     Current Outpatient Prescriptions on File Prior to Visit  Medication Sig Dispense Refill  . albuterol (PROVENTIL HFA;VENTOLIN HFA) 108 (90 BASE) MCG/ACT inhaler Inhale 2 puffs into the lungs every 6 (six) hours as needed for wheezing or shortness of breath. 1 Inhaler 3  . azelastine (ASTELIN) 0.1 % nasal spray Place 2 sprays into both nostrils 2 (two) times daily. Use in each nostril as directed 30 mL 2  . Cetirizine HCl (ZYRTEC ALLERGY PO) Take 10 mg by mouth daily.     .  citalopram (CELEXA) 20 MG tablet Take 1 tablet (20 mg total) by mouth daily. 30 tablet 1  . clopidogrel (PLAVIX) 75 MG tablet Take 1 tablet (75 mg total) by mouth daily. 90 tablet 3  . fluticasone (FLONASE) 50 MCG/ACT nasal spray SHAKE WELL AND USE 2 SPRAYS IN EACH NOSTRIL DAILY 16 g 0  . gabapentin (NEURONTIN) 100 MG capsule Take 2 capsules (200 mg total) by mouth 3 (three) times daily. 180 capsule 3  . losartan (COZAAR) 100 MG tablet Take 1 tablet (100 mg total) by mouth daily. 90 tablet 3  . montelukast (SINGULAIR) 10 MG tablet Take 1 tablet (10 mg total) by mouth at bedtime as needed. 30 tablet 3  . Multiple Vitamins-Minerals (MULTIVITAMINS THER. W/MINERALS) TABS tablet Take 1 tablet by mouth daily.    . Omega-3 Fatty Acids (OMEGA-3 FISH OIL) 1200 MG CAPS Take 1,200 mg by mouth daily.    Marland Kitchen omeprazole (PRILOSEC) 40 MG capsule Take 1 capsule (40 mg total) by mouth 2 (two) times daily. 180 capsule 0  . ranitidine (ZANTAC) 300 MG tablet Take 1 tablet (300 mg total) by mouth at bedtime as needed for heartburn. 30 tablet 5  . simvastatin (ZOCOR) 20 MG tablet TAKE 1 TABLET(20 MG) BY MOUTH DAILY 30 tablet 6   No current facility-administered medications on file prior to visit.    Allergies  Allergen Reactions  . Pollen Extract     Runny nose,itchy eyes  . Seasonal  Ic [Cholestatin]     Itchy eyes and runny nose    Family History  Problem Relation Age of Onset  . Hypertension Mother   . Hyperlipidemia Mother   . Fibromyalgia Mother   . Arthritis Mother     rheumatoid  . Diabetes Sister     type 2  . Hyperlipidemia Brother   . Hypertension Brother   . Ulcers Father 36    Bleeding Ulcers  . Cancer Maternal Uncle     prostate  . Kidney Stones Daughter   . Asthma Daughter   . Healthy Son   . Cancer Maternal Grandfather     skin ?    Social History   Social History  . Marital Status: Married    Spouse Name: Webb Silversmith  . Number of Children: 2  . Years of Education: college    Occupational History  .      retired   Social History Main Topics  . Smoking status: Never Smoker   . Smokeless tobacco: Never Used  . Alcohol Use: 0.0 oz/week    0 Standard drinks or equivalent per week     Comment: 1-2 occasioinal  . Drug Use: No  . 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 None   Social History Narrative   Patient consumes 2 cups of caffeine daily   Review of Systems - See HPI.  All other ROS are negative.  BP 102/58 mmHg  Pulse 73  Temp(Src) 98.1 F (36.7 C) (Oral)  Ht 5' 8"  (1.727 m)  Wt 178 lb 3.2 oz (80.831 kg)  BMI 27.10 kg/m2  SpO2 97%  Physical Exam  Constitutional: He is well-developed, well-nourished, and in no distress.  HENT:  Head: Normocephalic and atraumatic.  Eyes: Conjunctivae are normal. Pupils are equal, round, and reactive to light.  Neck: Neck supple.  Cardiovascular: Normal rate, regular rhythm, normal heart sounds and intact distal pulses.   Pulmonary/Chest: Effort normal and breath sounds normal. No respiratory distress. He has no wheezes. He has no rales.  Abdominal: Soft. Bowel sounds are normal. He exhibits no distension and no mass. There is no hepatosplenomegaly. There is tenderness in the epigastric area and periumbilical area. There is no rebound, no guarding, no CVA tenderness and negative Murphy's sign. No hernia.  Skin: Skin is warm and dry. No rash noted.  Psychiatric: Affect normal.  Vitals reviewed.   No results found for this or any previous visit (from the past 2160 hour(s)).  Assessment/Plan: 1. Epigastric pain Continue PPI. Begin probiotic. Will check labs today. May need GI referral for EGD. - CBC with Differential/Platelet - Comp Met (CMET) - Lipase - POCT Urinalysis Dipstick (Automated) - Urine Microscopic Only - CT Abdomen Pelvis W Contrast; Future  2. Periumbilical pain Will check labs today. Urine dip  unremarkable. Will check micro. Will check CT today. - CBC with Differential/Platelet - Comp Met (CMET) - Lipase - POCT Urinalysis Dipstick (Automated) - Urine Microscopic Only - CT Abdomen Pelvis W Contrast; Future

## 2016-02-03 NOTE — Progress Notes (Signed)
Pre visit review using our clinic review tool, if applicable. No additional management support is needed unless otherwise documented below in the visit note. 

## 2016-02-03 NOTE — Patient Instructions (Signed)
Please go to the lab for blood work. Then go downstairs to schedule CT scan. I will call with all results. Please stay well hydrated. Tylenol for pain. Continue acid reflux medication as directed. Add on a daily probiotic. Eat a bland diet.  If anything worsens before workup is complete, please go to the ER.

## 2016-02-04 ENCOUNTER — Other Ambulatory Visit: Payer: Self-pay | Admitting: Physician Assistant

## 2016-02-04 DIAGNOSIS — G8929 Other chronic pain: Secondary | ICD-10-CM

## 2016-02-04 DIAGNOSIS — R1013 Epigastric pain: Principal | ICD-10-CM

## 2016-02-07 DIAGNOSIS — N200 Calculus of kidney: Secondary | ICD-10-CM | POA: Diagnosis not present

## 2016-02-07 DIAGNOSIS — N210 Calculus in bladder: Secondary | ICD-10-CM | POA: Diagnosis not present

## 2016-02-07 DIAGNOSIS — K5732 Diverticulitis of large intestine without perforation or abscess without bleeding: Secondary | ICD-10-CM | POA: Diagnosis not present

## 2016-02-14 ENCOUNTER — Ambulatory Visit (INDEPENDENT_AMBULATORY_CARE_PROVIDER_SITE_OTHER): Payer: Medicare Other | Admitting: Neurology

## 2016-02-14 ENCOUNTER — Encounter: Payer: Self-pay | Admitting: Neurology

## 2016-02-14 VITALS — BP 95/62 | HR 58 | Ht 68.0 in | Wt 181.0 lb

## 2016-02-14 DIAGNOSIS — Q2112 Patent foramen ovale: Secondary | ICD-10-CM

## 2016-02-14 DIAGNOSIS — E785 Hyperlipidemia, unspecified: Secondary | ICD-10-CM

## 2016-02-14 DIAGNOSIS — I10 Essential (primary) hypertension: Secondary | ICD-10-CM

## 2016-02-14 DIAGNOSIS — Q211 Atrial septal defect: Secondary | ICD-10-CM | POA: Diagnosis not present

## 2016-02-14 DIAGNOSIS — I63411 Cerebral infarction due to embolism of right middle cerebral artery: Secondary | ICD-10-CM | POA: Diagnosis not present

## 2016-02-14 DIAGNOSIS — I82401 Acute embolism and thrombosis of unspecified deep veins of right lower extremity: Secondary | ICD-10-CM | POA: Diagnosis not present

## 2016-02-14 NOTE — Patient Instructions (Signed)
-   continue plavix and zocor for stroke prevention - continue low dose neurontin for the numbness and pain. May try 2 capsules 3 times a day if tolerable to help the symptoms - BP relatively low. Recommend to cut losartan back to half tablet once a day. Check BP at home and record and bring over to PCP for medication adjustment if needed. BP goal 120-130 - Follow up with your primary care physician for stroke risk factor modification. Recommend maintain blood pressure goal 120-130/80, diabetes with hemoglobin A1c goal below 6.5% and lipids with LDL cholesterol goal below 70 mg/dL.  - follow up in one year.

## 2016-02-14 NOTE — Progress Notes (Signed)
STROKE NEUROLOGY FOLLOW UP NOTE  NAME: Paul Drake DOB: 1946-05-13  REASON FOR VISIT: stroke follow up HISTORY FROM: pt and chart  Today we had the pleasure of seeing Paul Drake in follow-up at our Neurology Clinic. Pt was accompanied by no one.   History Summary Paul Drake is a LH 70 y.o. male with a history of right MCA infarct in 2008 with residue LLE minimal weakness was admitted on 09/20/14 for acute onset left sided weakness and difficulty with speech.he received tPA. His acute right frontal stroke is adjacent to your previous stroke in 2008 shown in MRI. Stroke work up showed PFO with valsalva on TEE and positive right LE DVT. He was put on Xarelto 15mg  twice a day for 21 days and then 20mg  daily for 5 months. He mentioned that before this happened he also had some diarrhea and he might have done valsalva maneuvers then.   Patient's previous stroke was in 2008 and the patient has a resultant LLE numbness and weakness. Patient was on ASA but stopped it about a month ago due to gastritis.He denies N/V. However, shortly after the gastritis, he developed kidney stone. He may have a lot of pain which caused him to have valsalva maneuvers.   Follow up 11/24/14 - the patient has been doing well. No recurrent stroke like symptoms. He still has left hand numbness which causing trouble with writing and handling tools. He is on Xarelto daily. He followed up with his PCP closely. His BP today in clinic is 136/82. He still has OT twice a week but will soon be completed. His hypercoagulable work up in hospital was negative.   05/09/15 follow up - the patient has been doing well, no recurrent strokelike symptoms. He is still on Xarelto without side effect. However he continued to suffer from kidney stone, sometimes really painful. Urologist hesitant to do lithotripsy due to bleeding risk. He has been on Xarelto for more than 6 months now. He still reports intermittent left-sided numbness and pain,  especially fingers at left hand. No other complaints, blood pressure 125/78. TCD confirmed PFO and no MES on 30 min monitoring.  08/15/15 follow up - pt has been doing well without stroke like symptoms on plavix. His kidney stone was taken out by cystoscopy and did not require surgery. He continues to complain of left hand numbness and tingling, feeling swallow. However, he only takes once a day dose of neurontin and feels drowsiness with neurontin. BP 123/75 in clinic today.    Interval History During the interval time, pt has been doing well from stroke standpoint. Still has left hand numbness especially after active exercise with golf practice. Had kidney stone removal and abdominal pain much improved. However, recently started again belly pain but in different location, more epigastric pain, had CT abdomen which was negative. Considered diverticulitis and put on Abx last week, and seems getting better. BP today was low, 95/62, will decrease losartan dose.    REVIEW OF SYSTEMS: Full 14 system review of systems performed and notable only for those listed below and in HPI above, all others are negative:  Constitutional:   Cardiovascular:  Ear/Nose/Throat:   Skin:  Eyes:   Respiratory:   Gastroitestinal:  Abdominal pain Genitourinary: frequency of urination and urgency Hematology/Lymphatic:   Endocrine:  Musculoskeletal:  Allergy/Immunology:   Neurological:   Psychiatric:  Sleep: frequent waking, sleep talking, acting out of dreams  The following represents the patient's updated allergies and side effects list: Allergies  Allergen Reactions  . Pollen Extract     Runny nose,itchy eyes  . Seasonal Ic [Cholestatin]     Itchy eyes and runny nose    The neurologically relevant items on the patient's problem list were reviewed on today's visit.  Neurologic Examination  A problem focused neurological exam (12 or more points of the single system neurologic examination, vital signs  counts as 1 point, cranial nerves count for 8 points) was performed.  Blood pressure 95/62, pulse 58, height 5\' 8"  (1.727 m), weight 181 lb (82.101 kg).  General - Well nourished, well developed, in no apparent distress.  Ophthalmologic - Sharp disc margins OU.  Cardiovascular - Regular rate and rhythm with no murmur.  Mental Status -  Level of arousal and orientation to time, place, and person were intact. Language including expression, naming, repetition, comprehension, reading, and writing was assessed and found intact. Attention span and concentration were normal. Recent and remote memory were intact. Fund of Knowledge was assessed and was intact.  Cranial Nerves II - XII - II - Visual field intact OU. III, IV, VI - Extraocular movements intact. V - Facial sensation intact bilaterally. VII - Facial movement intact bilaterally. VIII - Hearing & vestibular intact bilaterally. X - Palate elevates symmetrically. XI - Chin turning & shoulder shrug intact bilaterally. XII - Tongue protrusion intact.  Motor Strength - The patient's strength was normal in all extremities and pronator drift was absent.  Bulk was normal and fasciculations were absent.   Motor Tone - Muscle tone was assessed at the neck and appendages and was normal.  Reflexes - The patient's reflexes were normal in all extremities and he had no pathological reflexes.  Sensory - Light touch, temperature/pinprick decreased on the left ulnar hand and all fingertips and Romberg testing were assessed and were normal.    Coordination - The patient had normal movements in the hands and feet with no ataxia or dysmetria.  Tremor was absent.  Gait and Station - The patient's transfers, posture, gait, station, and turns were observed as normal.  Data reviewed: I personally reviewed the images and agree with the radiology interpretations.  Ct Head (brain) Wo Contrast  09/20/2014 IMPRESSION: 1. No acute intracranial pathology  seen on CT. 2. Chronic infarct at the high right parietal region, with associated encephalomalacia. 3. Mild cortical volume loss and scattered small vessel ischemic microangiopathy.   Carotid Doppler Bilateral: 1-39% ICA stenosis. Vertebral artery flow is antegrade.  MRI HEAD Moderate size remote infarct posterior right frontal -parietal lobe with encephalomalacia. Small to moderate size acute infarct along the anterior margin of this remote infarct involving the posterior right frontal lobe extending to the posterior right operculum region. No intracranial hemorrhage.  MRA HEAD Decrease number of visualized right middle cerebral artery branch vessels consistent with patient's remote and acute infarct. No significant stenosis proximal to the branch vessel level. Ectatic vertebral arteries and basilar artery with slight irregularity of the distal left vertebral artery without significant stenosis. Nonvisualized right posterior inferior cerebellar artery. Mild posterior cerebral artery branch vessel irregularity and narrowing more notable on the right.  EKG normal EKG, normal sinus rhythm. For complete results please see formal report.  LE venous doppler - The right lower extremity is positive for deep vein thrombosis involving the right gastrocnemius, posterior tibial, and peroneal veins. There is no evidence of left lower extremity deep vein thrombosis.  TEE -  1) PFO by bubble study not visualized Positive bubble study only with valsalva 2)  Normal EF 60% 3) No LAA thrombus 4) Trivial MR 5) Mild AR 6) No aortic debris 7) Mild TR 8) Normal RV  TCD bubble study positive with grade I at rest and grade III with valsalva.  TCD emboli detection - negative for MES.    Assessment: As you may recall, he is a 70 y.o. Caucasian male with PMH of right MCA CVA in 2008 was admitted on1/4/16 for again right MCA small stroke. The first stroke more at right motor strip and the last  stroke more at sensory strip. Stroke w/u showed positive DVT and a likely small PFO with RLS only on valsalva maneuver by TEE. However, TCD bubble study showed grade 1 RLS at rest and agrees 3 RLS with Valsalva maneuver. TCD MES negative. For the two strokes, it is likely pt had some valsalva maneuver prior to the strokes. He is on Xarelto for more than 6 months and question is whether we should keep him on it for questionable recurrent DVT and stroke. Due to potential kidney stone surgery with bleeding risk, we optioned to switch Xarelto to plavix for stroke prevention. Low-dose Neurontin for numbness/pain. Currently, he is stable with plavix without stroke like symptoms. Still has left hand numbness tingling but tolerable. BP was low and will decrease losartan to 50mg  daily and check BP at home.  Plan:  - continue plavix and zocor for stroke prevention - continue low dose neurontin for the numbness and pain. May try 2 capsules 3 times a day if tolerable to help the symptoms - BP relatively low. Recommend to cut losartan back to half tablet once a day. Check BP at home and record and bring over to PCP for medication adjustment if needed. BP goal 120-130 - Follow up with your primary care physician for stroke risk factor modification. Recommend maintain blood pressure goal 120-130/80, diabetes with hemoglobin A1c goal below 6.5% and lipids with LDL cholesterol goal below 70 mg/dL.  - follow up in one year.   No orders of the defined types were placed in this encounter.    No orders of the defined types were placed in this encounter.    Patient Instructions  - continue plavix and zocor for stroke prevention - continue low dose neurontin for the numbness and pain. May try 2 capsules 3 times a day if tolerable to help the symptoms - BP relatively low. Recommend to cut losartan back to half tablet once a day. Check BP at home and record and bring over to PCP for medication adjustment if needed. BP  goal 120-130 - Follow up with your primary care physician for stroke risk factor modification. Recommend maintain blood pressure goal 120-130/80, diabetes with hemoglobin A1c goal below 6.5% and lipids with LDL cholesterol goal below 70 mg/dL.  - follow up in one year.    Rosalin Hawking, MD PhD [facility] [address], [place], [place] [address] [phone]

## 2016-02-16 ENCOUNTER — Ambulatory Visit: Payer: Medicare Other | Admitting: Family Medicine

## 2016-02-17 IMAGING — CT CT HEAD W/O CM
1 series · 15 of 30 positions shown, 19 images · non-contrast
Comparison: None.

CLINICAL DATA: Code stroke. Slurred speech and left hand weakness.
Initial encounter.

EXAM:
CT HEAD WITHOUT CONTRAST
TECHNIQUE: Contiguous axial images were obtained from the base of the skull
through the vertex without intravenous contrast.

[Series 2: head 4.8 h37s · axial · 0.48mm/px · z∈[+1098,+1234]mm · 15 of 32 slices shown, 19 images]
[im 2/32  brain]
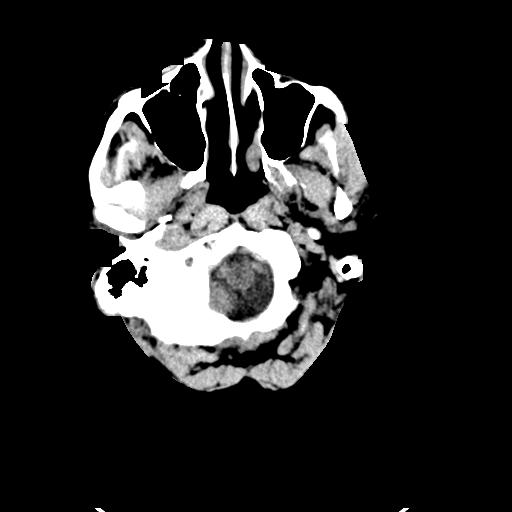
[im 2/32  bone]
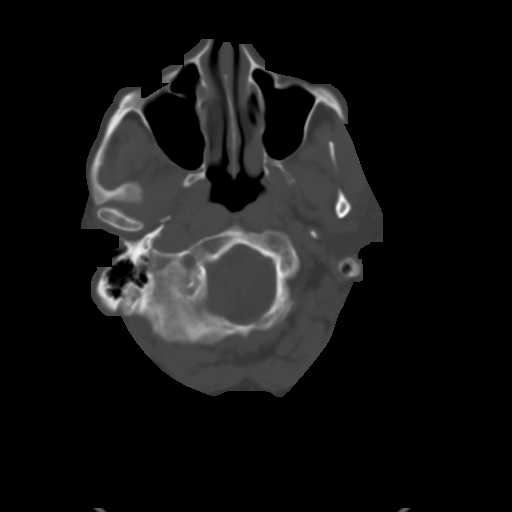
[im 4/32  brain]
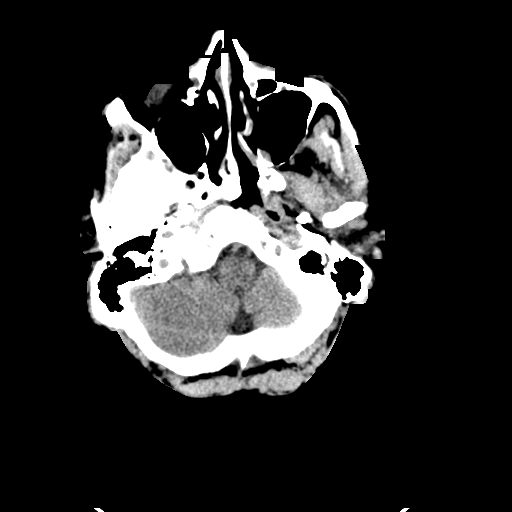
[im 6/32  brain]
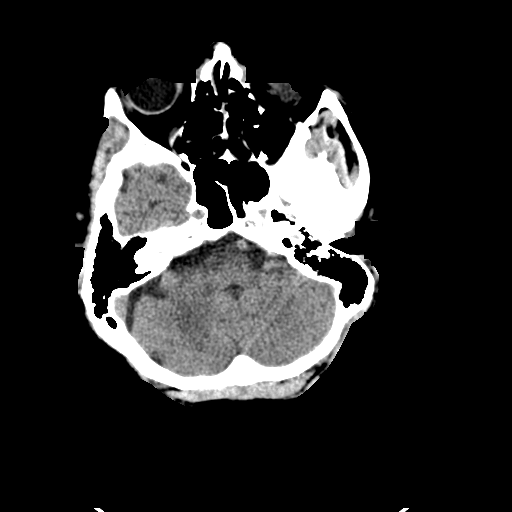
[im 8/32  brain]
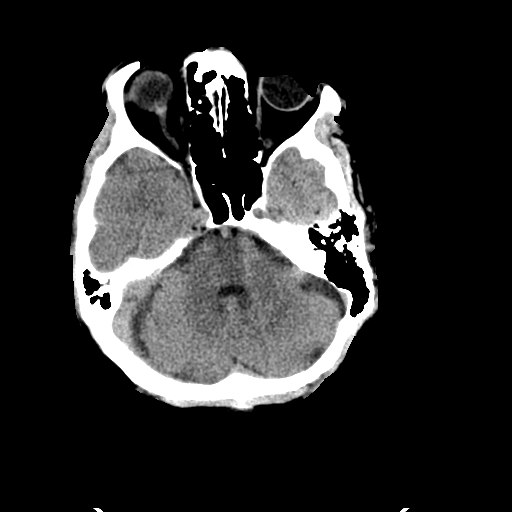
[im 10/32  brain]
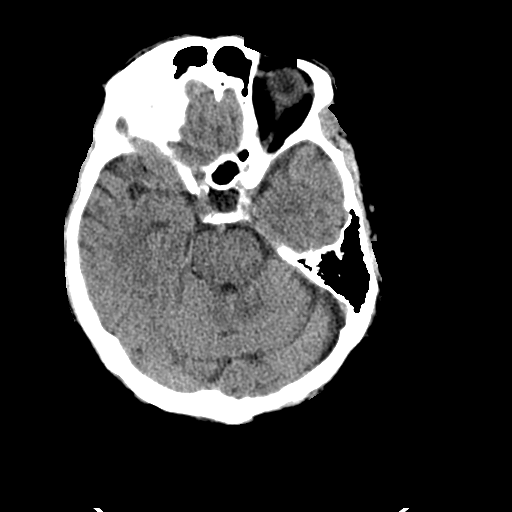
[im 10/32  bone]
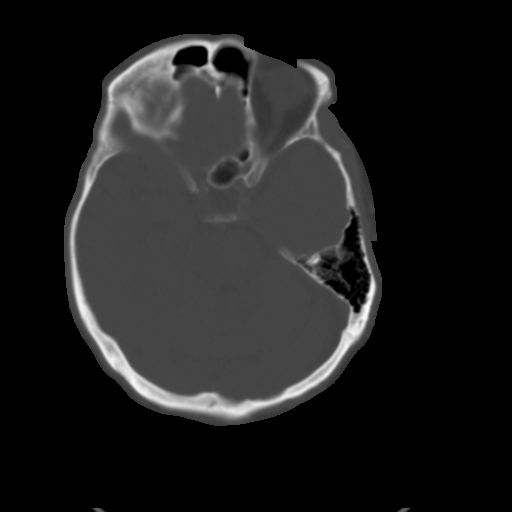
[im 12/32  brain]
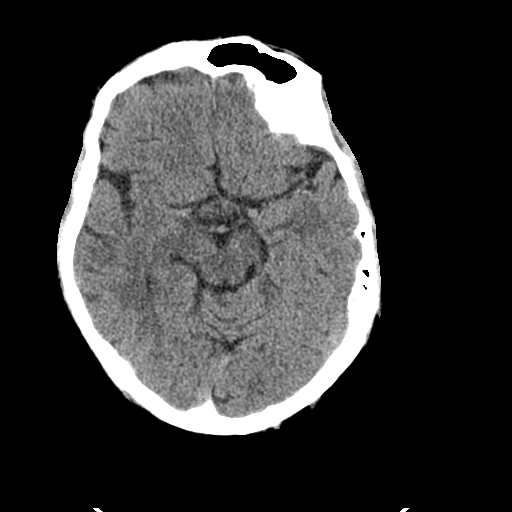
[im 14/32  brain]
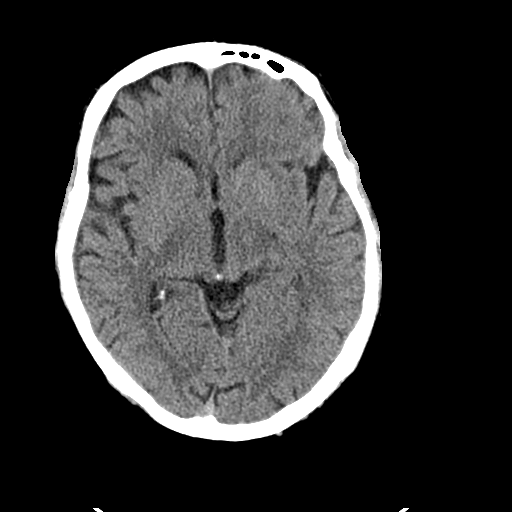
[im 17/32  brain]
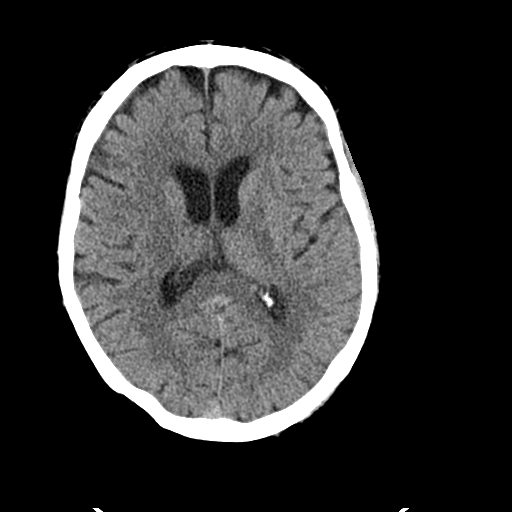
[im 18/32  brain]
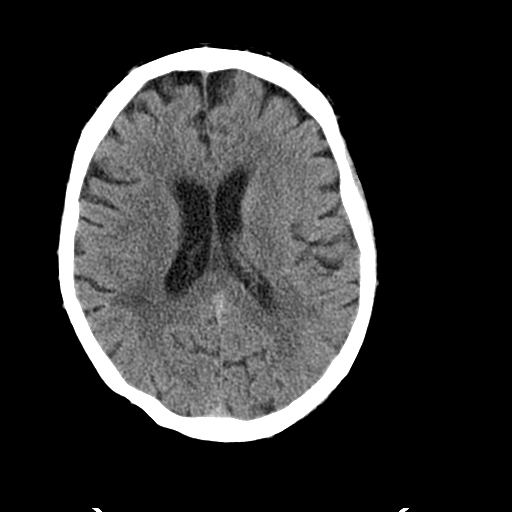
[im 18/32  bone]
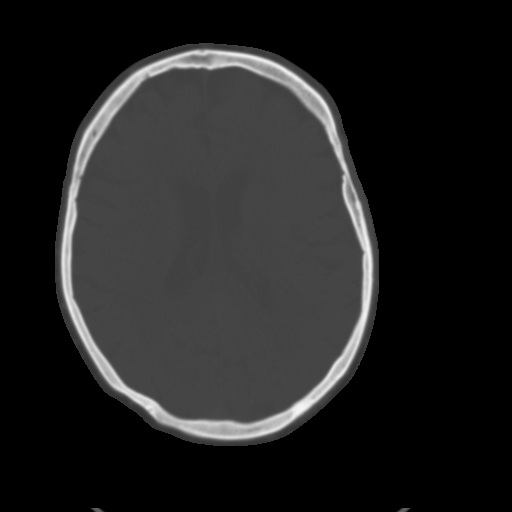
[im 20/32  brain]
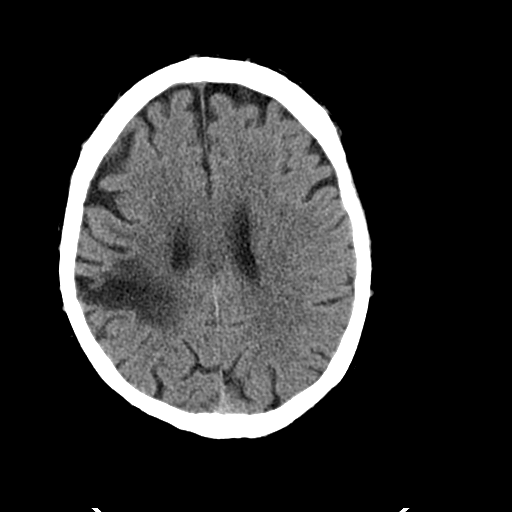
[im 22/32  brain]
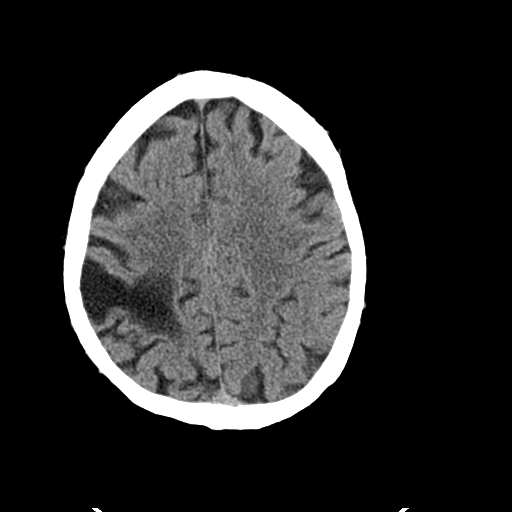
[im 24/32  brain]
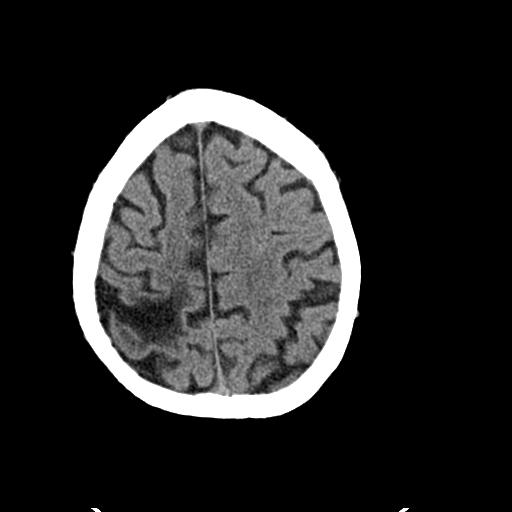
[im 26/32  brain]
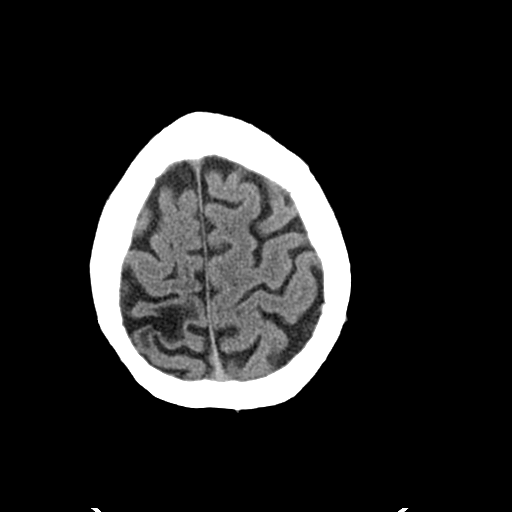
[im 26/32  bone]
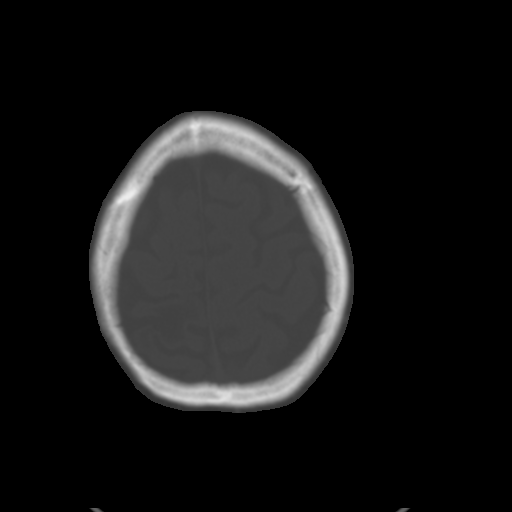
[im 28/32  brain]
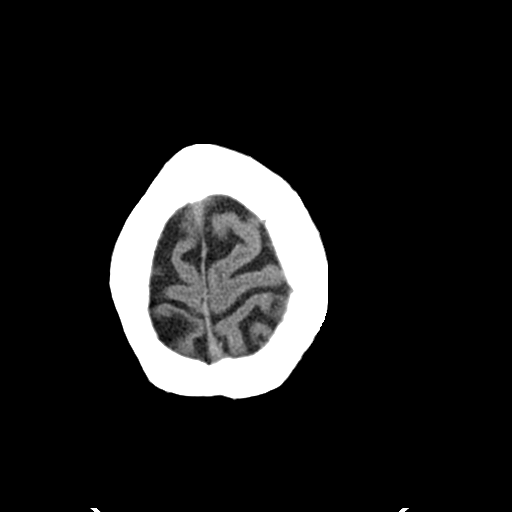
[im 30/32  brain]
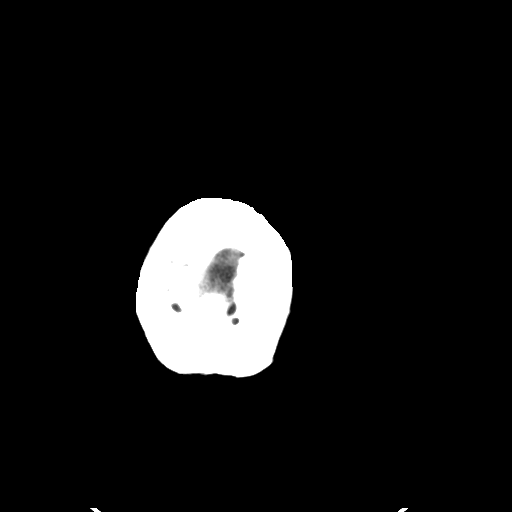

[15 of 30 positions shown; findings below may reference images not displayed]

FINDINGS: There is no evidence of acute infarction, mass lesion, or intra- or
extra-axial hemorrhage on CT.

A chronic infarct is noted at the high right parietal region, with
associated encephalomalacia. Prominence of the ventricles and sulci
reflects mild cortical volume loss. Mild periventricular white
matter change likely reflects small vessel ischemic microangiopathy.
Mild cerebellar atrophy is noted.

The brainstem and fourth ventricle are within normal limits. The
basal ganglia are unremarkable in appearance. No mass effect or
midline shift is seen.

There is no evidence of fracture; visualized osseous structures are
unremarkable in appearance. The orbits are within normal limits. The
paranasal sinuses and mastoid air cells are well-aerated. No
significant soft tissue abnormalities are seen.
IMPRESSION: 1. No acute intracranial pathology seen on CT.
2. Chronic infarct at the high right parietal region, with
associated encephalomalacia.
3. Mild cortical volume loss and scattered small vessel ischemic
microangiopathy.

These results were called by telephone at the time of interpretation
on 09/20/2014 at [DATE] to Dr. KIM CARLO MALI, who verbally
acknowledged these results.

## 2016-02-18 IMAGING — MR MR MRA HEAD W/O CM
9 of 11 series · 28 of 48 positions shown · non-contrast
Comparison: 09/20/2014 CT.  No comparison MR.

CLINICAL DATA: 68-year-old hypertensive male with history of
hyperlipidemia presenting with left lower extremity numbness and
weakness 09/20/2014. Difficulty with speech. History of prior
stroke. Subsequent encounter.

EXAM:
MRI HEAD WITHOUT CONTRAST
MRA HEAD WITHOUT CONTRAST
TECHNIQUE: Multiplanar, multiecho pulse sequences of the brain and surrounding
structures were obtained without intravenous contrast. Angiographic
images of the head were obtained using MRA technique without
contrast.

[Series 2: FLAIR · sagittal · 5.0mm · 0.47mm/px · 1 of 23 slices shown (1 of 2)]
[im 1/23]
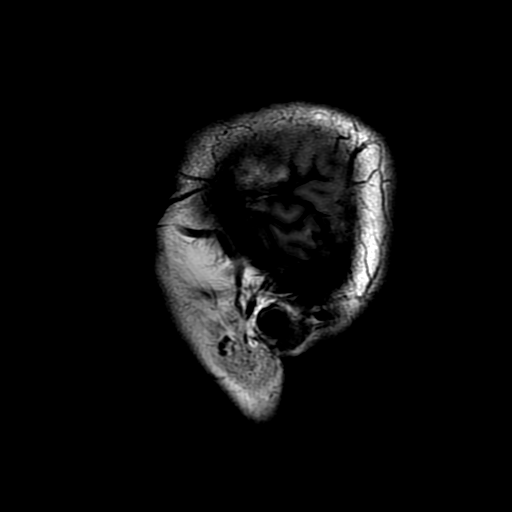

[Series 4: DWI · axial · 3.6mm · 1.02mm/px · z∈[-71,+66]mm · 5 of 80 slices shown (1 of 4)]
[im 1/80]
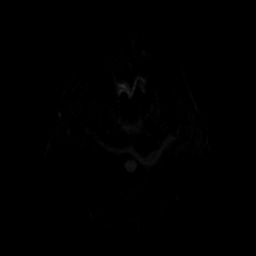
[im 20/80]
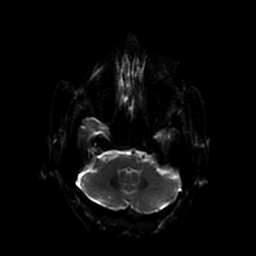
[im 40/80]
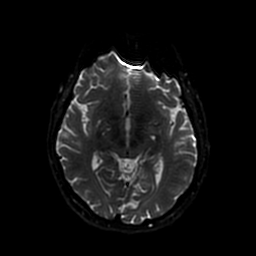
[im 60/80]
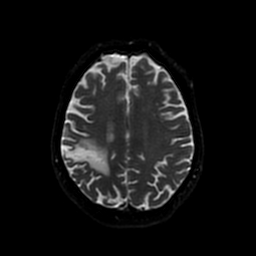
[im 80/80]
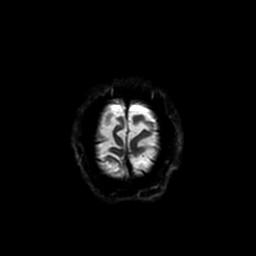

[Series 5: ax (id) 2 · axial · 1.4mm · 0.43mm/px · z∈[-74,-7]mm · 6 of 152 slices shown]
[im 1/152]
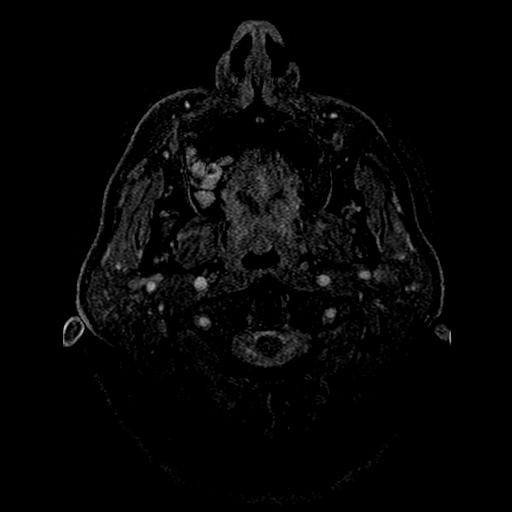
[im 17/152]
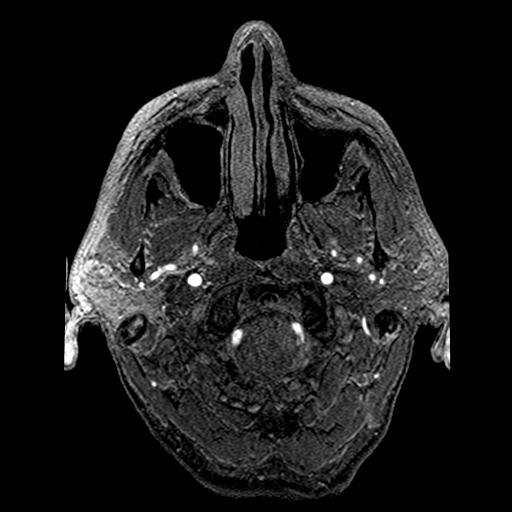
[im 51/152]
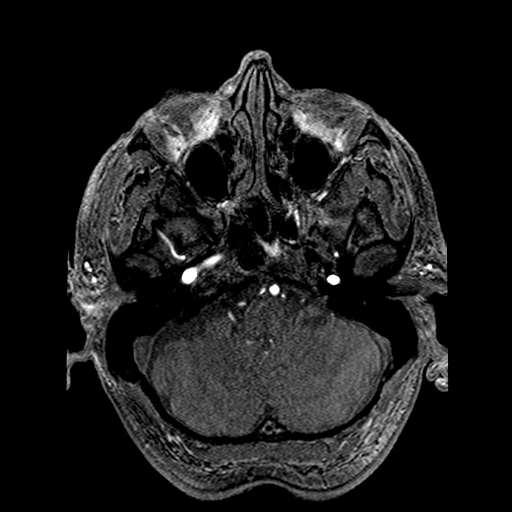
[im 68/152]
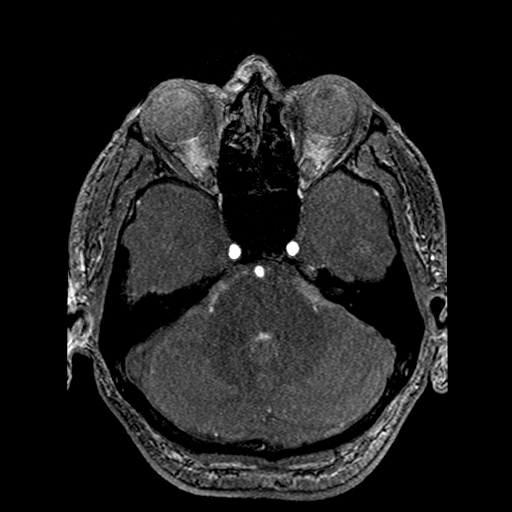
[im 84/152]
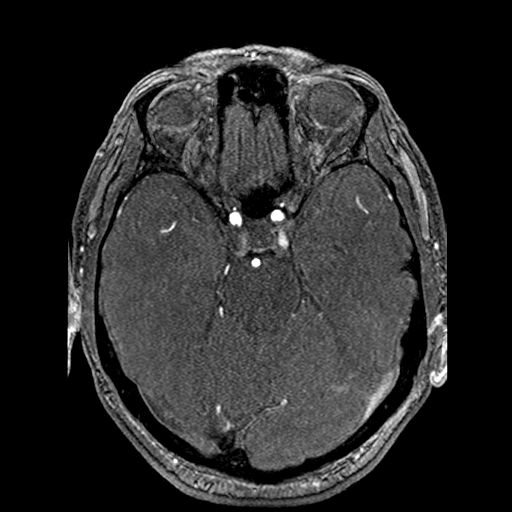
[im 101/152]
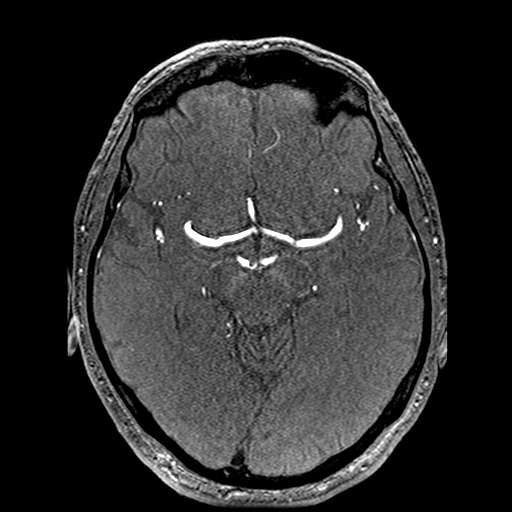

[Series 6: T2 · axial · 5.0mm · 0.43mm/px · z∈[-75,+60]mm · 2 of 24 slices shown (1 of 2)]
[im 1/24]
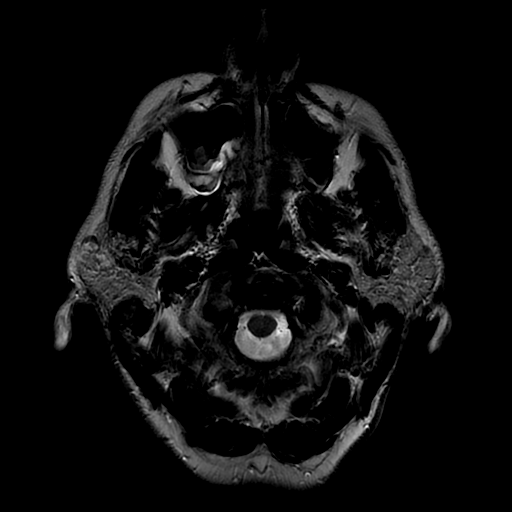
[im 24/24]
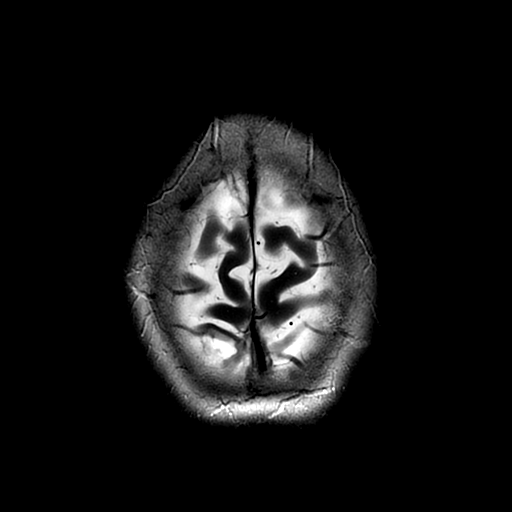

[Series 7: FLAIR · axial · 5.0mm · 0.43mm/px · z∈[-75,+60]mm · 2 of 24 slices shown (2 of 2)]
[im 1/24]
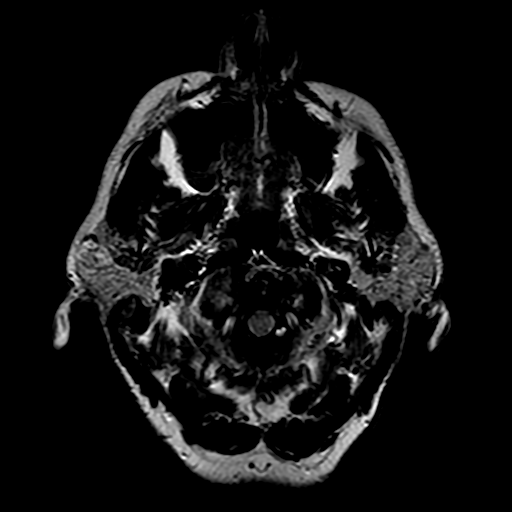
[im 24/24]
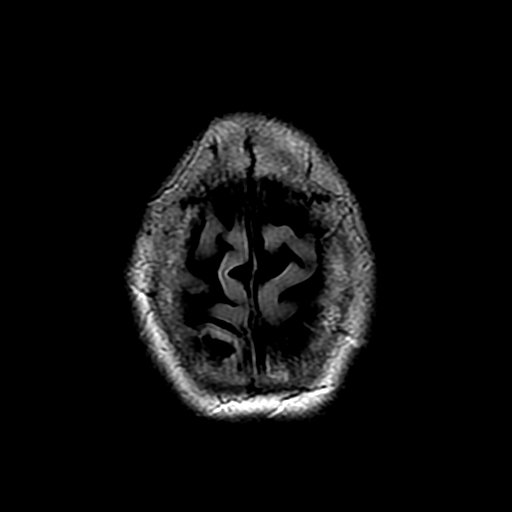

[Series 8: DWI · coronal · 5.0mm · 1.02mm/px · 5 of 66 slices shown (2 of 4)]
[im 1/66]
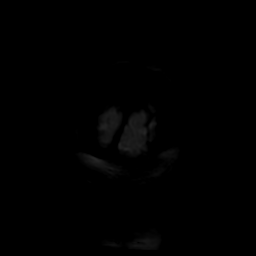
[im 17/66]
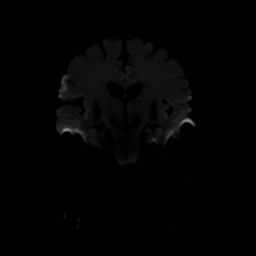
[im 33/66]
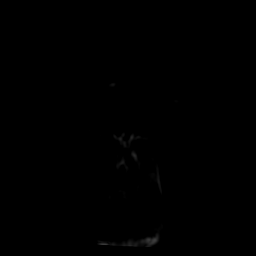
[im 49/66]
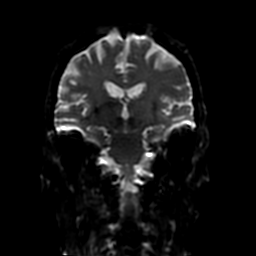
[im 66/66]
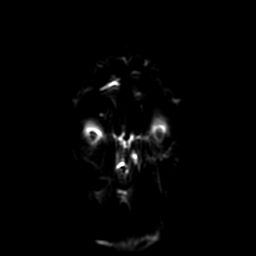

[Series 11: T2 · coronal · 5.0mm · 0.47mm/px · 2 of 28 slices shown (2 of 2)]
[im 1/28]
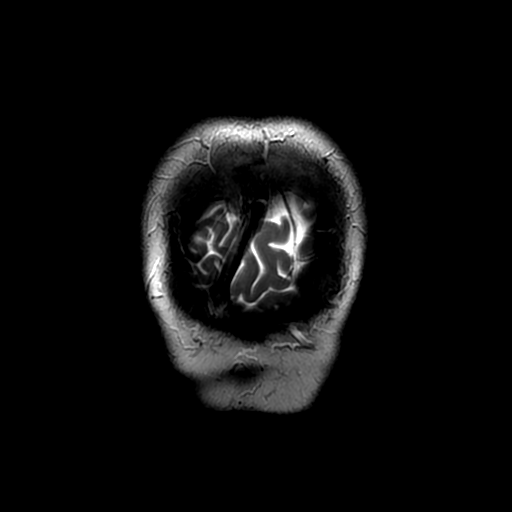
[im 28/28]
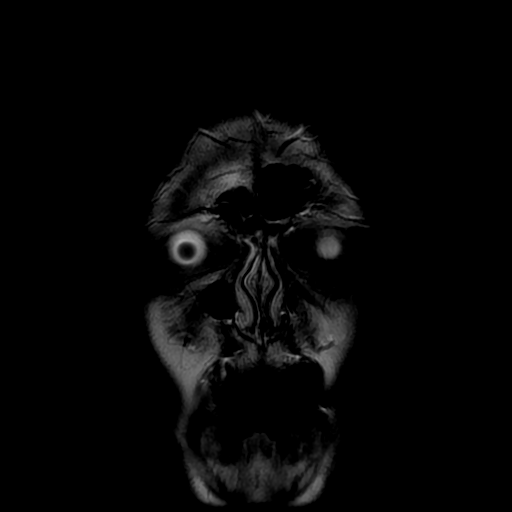

[Series 400: DWI · axial · 3.6mm · 1.02mm/px · z∈[-71,+66]mm · 3 of 40 slices shown (3 of 4)]
[im 1/40]
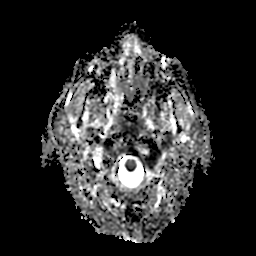
[im 20/40]
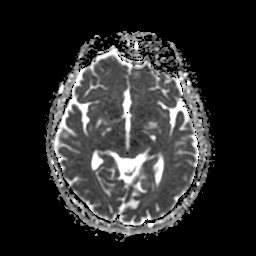
[im 40/40]
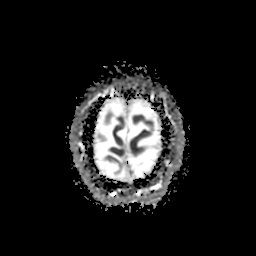

[Series 800: DWI · coronal · 5.0mm · 1.02mm/px · 2 of 32 slices shown (4 of 4)]
[im 1/32]
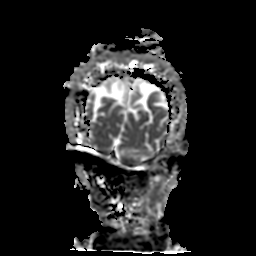
[im 32/32]
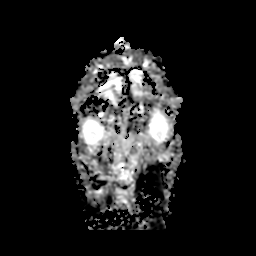

[28 of 48 positions shown; findings below may reference images not displayed]

FINDINGS: MRI HEAD FINDINGS

Moderate size remote infarct posterior right frontal -parietal lobe
with encephalomalacia. Small to moderate size acute infarct along
the anterior margin of this remote infarct involving the posterior
right frontal lobe extending to the posterior right operculum
region.

No intracranial hemorrhage.

Very mild small vessel disease type changes.

Mild atrophy without hydrocephalus.

No intracranial mass lesion noted on this unenhanced exam.

Partially empty non expanded sella incidentally noted. Cervical
medullary junction, pineal region and orbital structures
unremarkable.

Mild to moderate mucosal thickening inferior aspect right maxillary
sinus.

MRA HEAD FINDINGS

Decrease number of visualized right middle cerebral artery branch
vessels consistent with patient's remote and acute infarct. No
significant stenosis proximal to the branch vessel level.

Ectatic vertebral arteries and basilar artery with slight
irregularity of the distal left vertebral artery without significant
stenosis.

Nonvisualized right posterior inferior cerebellar artery.

Fetal type origin right posterior cerebral artery.

Mild posterior cerebral artery branch vessel irregularity and
narrowing more notable on the right.

No aneurysm noted.
IMPRESSION: MRI HEAD

Moderate size remote infarct posterior right frontal -parietal lobe
with encephalomalacia. Small to moderate size acute infarct along
the anterior margin of this remote infarct involving the posterior
right frontal lobe extending to the posterior right operculum
region.

No intracranial hemorrhage.

MRA HEAD

Decrease number of visualized right middle cerebral artery branch
vessels consistent with patient's remote and acute infarct. No
significant stenosis proximal to the branch vessel level.

Ectatic vertebral arteries and basilar artery with slight
irregularity of the distal left vertebral artery without significant
stenosis.

Nonvisualized right posterior inferior cerebellar artery.

Mild posterior cerebral artery branch vessel irregularity and
narrowing more notable on the right.

## 2016-02-24 DIAGNOSIS — N200 Calculus of kidney: Secondary | ICD-10-CM | POA: Diagnosis not present

## 2016-02-24 DIAGNOSIS — N210 Calculus in bladder: Secondary | ICD-10-CM | POA: Diagnosis not present

## 2016-02-24 DIAGNOSIS — R1030 Lower abdominal pain, unspecified: Secondary | ICD-10-CM | POA: Diagnosis not present

## 2016-03-06 ENCOUNTER — Other Ambulatory Visit: Payer: Self-pay | Admitting: Family Medicine

## 2016-03-08 ENCOUNTER — Other Ambulatory Visit: Payer: Self-pay | Admitting: Family Medicine

## 2016-03-29 ENCOUNTER — Other Ambulatory Visit: Payer: Self-pay | Admitting: Family Medicine

## 2016-03-29 MED ORDER — FLUTICASONE PROPIONATE 50 MCG/ACT NA SUSP: NASAL | Status: DC

## 2016-04-04 ENCOUNTER — Other Ambulatory Visit: Payer: Self-pay | Admitting: Family Medicine

## 2016-04-05 ENCOUNTER — Ambulatory Visit (INDEPENDENT_AMBULATORY_CARE_PROVIDER_SITE_OTHER): Payer: Medicare Other | Admitting: Gastroenterology

## 2016-04-05 ENCOUNTER — Encounter: Payer: Self-pay | Admitting: Gastroenterology

## 2016-04-05 VITALS — BP 128/78 | HR 64 | Ht 67.25 in | Wt 178.4 lb

## 2016-04-05 DIAGNOSIS — I63411 Cerebral infarction due to embolism of right middle cerebral artery: Secondary | ICD-10-CM | POA: Diagnosis not present

## 2016-04-05 DIAGNOSIS — R1033 Periumbilical pain: Secondary | ICD-10-CM | POA: Diagnosis not present

## 2016-04-05 DIAGNOSIS — K5900 Constipation, unspecified: Secondary | ICD-10-CM | POA: Diagnosis not present

## 2016-04-05 DIAGNOSIS — K2270 Barrett's esophagus without dysplasia: Secondary | ICD-10-CM | POA: Diagnosis not present

## 2016-04-05 NOTE — Progress Notes (Signed)
    History of Present Illness: This is a 70 year old male with Barrett's esophagus, constipation complaining of periumbilical abdominal pain. Patient noted mild constant periumbilical abdominal pain for several weeks. Abdominal pelvic CT scan performed below. States his urologist gave him a course of antibiotics for presumed diverticulitis and after the antibiotics his symptoms abated. He still relates problems with constipation and incomplete evacuation which make him feel a little uncomfortable. Colonoscopy in 2014 and CT scan below did not show diverticulosis.  02/03/2016 IMPRESSION: No evidence of appendicitis or acute bowel pathology.  BILATERAL nonobstructing renal calculi.  Fatty infiltration of liver.  Current Medications, Allergies, Past Medical History, Past Surgical History, Family History and Social History were reviewed in [employer] record.  Physical Exam: General: Well developed, well nourished, no acute distress Head: Normocephalic and atraumatic Eyes:  sclerae anicteric, EOMI Ears: Normal auditory acuity Mouth: No deformity or lesions Lungs: Clear throughout to auscultation Heart: Regular rate and rhythm; no murmurs, rubs or bruits Abdomen: Soft, non tender and non distended. No masses, hepatosplenomegaly or hernias noted. Normal Bowel sounds Musculoskeletal: Symmetrical with no gross deformities  Pulses:  Normal pulses noted Extremities: No clubbing, cyanosis, edema or deformities noted Neurological: Alert oriented x 4, grossly nonfocal Psychological:  Alert and cooperative. Normal mood and affect  Assessment and Recommendations:  1. Periumbilical abdominal pain, resolved. Likely IBS, constipation or bacterial overgrowth related. Patient reassured that he does not have diverticulosis and did not have diverticulitis.   2. Chronic constipation. Previously had responded to regular use of MiraLAX. Resume MiraLAX once or twice daily titrated for  adequate bowel movements.  3. Barrett's esophagus and GERD. Continue omeprazole 40 mg twice daily. 3 year interval surveillance endoscopy recommended in 07/2017.  4. Hepatic steatosis. Long-term fat modified and carb modified diet and weight loss to achieve optimal weight supervised by his PCP. BMI=27.73.

## 2016-04-05 NOTE — Patient Instructions (Signed)
You can take over the counter Miralax 1-2 x daily for constipation.   You will be due for a recall colonoscopy in 12/2022 and endoscopy recall in 07/2017. We will send you a reminder in the mail when it gets closer to that time.  Thank you for choosing me and [facility].  Pricilla Riffle. Dagoberto Ligas., MD., [facility] Reg[facility]

## 2016-04-23 ENCOUNTER — Other Ambulatory Visit: Payer: Medicare Other

## 2016-04-30 ENCOUNTER — Ambulatory Visit: Payer: Medicare Other | Admitting: Family Medicine

## 2016-05-01 ENCOUNTER — Other Ambulatory Visit: Payer: Self-pay | Admitting: Family Medicine

## 2016-05-01 ENCOUNTER — Ambulatory Visit: Payer: Medicare Other | Admitting: *Deleted

## 2016-05-01 ENCOUNTER — Encounter: Payer: Medicare Other | Admitting: Family Medicine

## 2016-05-12 ENCOUNTER — Other Ambulatory Visit: Payer: Self-pay | Admitting: Neurology

## 2016-05-12 DIAGNOSIS — I639 Cerebral infarction, unspecified: Secondary | ICD-10-CM

## 2016-06-14 ENCOUNTER — Telehealth: Payer: Self-pay | Admitting: Family Medicine

## 2016-06-14 NOTE — Telephone Encounter (Signed)
Let him know he took the Tdap in 2015 and we do not usually boost it before 5-10 years is up. I think his daughter's OB will be fine with that. If not and they want to boost it will not hurt him but I do not think insurance will cover it so soon, I cannot say for sure. He can always pay cash for this.

## 2016-06-14 NOTE — Telephone Encounter (Signed)
Our records indicate his last tdap was 11/11/2013.  Looks like he is ok?  Let me know.

## 2016-06-14 NOTE — Telephone Encounter (Signed)
Caller name: Keanon  Relation to pt: self  Call back number: [phone] Pharmacy:  Reason for call: Pt came in office to make an appt for his flu shot and requested if he can have a tetanus shot also since pt states his daughter is having a baby and she is requesting for him to have his shot before baby arriving. Pt already scheduled his flu shot for this Monday 06-18-16 and would like to know if possible for same day to have it done. Please advise.

## 2016-06-14 NOTE — Telephone Encounter (Signed)
Patient informed of PCP instructions. 

## 2016-06-15 ENCOUNTER — Other Ambulatory Visit: Payer: Self-pay | Admitting: Family Medicine

## 2016-06-17 ENCOUNTER — Other Ambulatory Visit: Payer: Self-pay | Admitting: Family Medicine

## 2016-06-18 ENCOUNTER — Ambulatory Visit: Payer: Medicare Other

## 2016-06-19 ENCOUNTER — Ambulatory Visit: Payer: Medicare Other | Admitting: *Deleted

## 2016-06-19 ENCOUNTER — Other Ambulatory Visit: Payer: Self-pay | Admitting: Family Medicine

## 2016-06-25 ENCOUNTER — Encounter: Payer: Self-pay | Admitting: Medical

## 2016-06-25 ENCOUNTER — Ambulatory Visit (INDEPENDENT_AMBULATORY_CARE_PROVIDER_SITE_OTHER): Payer: Medicare Other | Admitting: Medical

## 2016-06-25 VITALS — BP 146/88 | HR 62 | Temp 98.1°F | Wt 183.0 lb

## 2016-06-25 DIAGNOSIS — I639 Cerebral infarction, unspecified: Secondary | ICD-10-CM

## 2016-06-25 DIAGNOSIS — H9201 Otalgia, right ear: Secondary | ICD-10-CM

## 2016-06-25 MED ORDER — AMOXICILLIN-POT CLAVULANATE 875-125 MG PO TABS: 1.0000 | ORAL_TABLET | Freq: Two times a day (BID) | ORAL | 0 refills | Status: DC

## 2016-06-25 MED ORDER — METHYLPREDNISOLONE ACETATE 40 MG/ML IJ SUSP
40.0000 mg | Freq: Once | INTRAMUSCULAR | Status: AC
  Administered 2016-06-25: 40 mg via INTRAMUSCULAR

## 2016-06-25 NOTE — Progress Notes (Signed)
Subjective:    Patient ID: Paul Drake, male    DOB: Aug 03, 1946, 70 y.o.   MRN: [id number]  HPI  Pt in with some recent allergies with some ear pressure.  Pt has history of allergies in fall year round. Some runny nose and sneezing. Pt some sinus congestion. Rt ear pressure. Hx of ear infections at times following allergies.(recent symptoms for about 5-7 days)   Pt on both flonase and astelin. Also on singulair and zyrtec.  Pt not on any diabetic meds. Pt a1c in past 5 range.   Review of Systems  Constitutional: Negative for chills and fatigue.  HENT: Positive for congestion, ear pain, postnasal drip and sneezing. Negative for sinus pressure.        Rt ear pressure not pain.  Eyes: Negative for pain.  Respiratory: Negative for cough and shortness of breath.   Gastrointestinal: Negative for abdominal pain.  Musculoskeletal: Negative for back pain.  Skin: Negative for rash.  Neurological: Negative for dizziness and light-headedness.  Hematological: Negative for adenopathy. Does not bruise/bleed easily.  Psychiatric/Behavioral: Negative for behavioral problems and confusion.    Past Medical History:  Diagnosis Date  . Allergic state 12/29/2013  . Arthritis   . Barrett's esophagus   . Chicken pox   . CVA (cerebral infarction)   . Depression   . GERD (gastroesophageal reflux disease)   . HTN (hypertension), benign 04/01/2015  . Hyperglycemia 01/06/2015  . Hyperlipemia   . Hypertension   . IBS (irritable bowel syndrome)   . Kidney stones   . Measles as a child  . Medicare annual wellness visit, subsequent 06/26/2015  . Mumps as a child  . Otitis externa 12/10/2013  . Seasonal allergies    some asthma  . Stroke ([place])   . Unspecified asthma(493.90) 12/29/2013     Social History   Social History  . Marital status: Married    Spouse name: Webb Silversmith  . Number of children: 2  . Years of education: college   Occupational History  .      retired   Social History Main  Topics  . Smoking status: Never Smoker  . Smokeless tobacco: Never Used  . Alcohol use 0.6 oz/week    1 Shots of liquor per week     Comment: 1-2 occasioinal  . Drug use: No  . 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 Not on file   Social History Narrative   Patient consumes 2 cups of caffeine daily    Past Surgical History:  Procedure Laterality Date  . COLONOSCOPY    . LITHOTRIPSY     multiple times  . TEE WITHOUT CARDIOVERSION N/A 09/22/2014   Procedure: TRANSESOPHAGEAL ECHOCARDIOGRAM (TEE);  Surgeon: Josue Hector, MD;  Location: [facility] ENDOSCOPY;  Service: Cardiovascular;  Laterality: N/A;  . WISDOM TOOTH EXTRACTION      Family History  Problem Relation Age of Onset  . Hypertension Mother   . Hyperlipidemia Mother   . Fibromyalgia Mother   . Arthritis Mother     rheumatoid  . Diabetes Sister     type 2  . Hyperlipidemia Brother   . Hypertension Brother   . Ulcers Father 36    Bleeding Ulcers  . Cancer Maternal Uncle     prostate  . Kidney Stones Daughter   . Asthma Daughter   . Healthy Son   . Cancer Maternal Grandfather  skin ?  . Stroke Maternal Aunt     Allergies  Allergen Reactions  . Pollen Extract     Runny nose,itchy eyes  . Seasonal Ic [Cholestatin]     Itchy eyes and runny nose    Current Outpatient Prescriptions on File Prior to Visit  Medication Sig Dispense Refill  . albuterol (PROVENTIL HFA;VENTOLIN HFA) 108 (90 BASE) MCG/ACT inhaler Inhale 2 puffs into the lungs every 6 (six) hours as needed for wheezing or shortness of breath. 1 Inhaler 3  . azelastine (ASTELIN) 0.1 % nasal spray Place 2 sprays into both nostrils 2 (two) times daily. Use in each nostril as directed 30 mL 2  . Cetirizine HCl (ZYRTEC ALLERGY PO) Take 10 mg by mouth daily.     . citalopram (CELEXA) 20 MG tablet TAKE 1 TABLET(20 MG) BY MOUTH DAILY 30 tablet 0  . clopidogrel (PLAVIX) 75 MG  tablet TAKE 1 TABLET(75 MG) BY MOUTH DAILY 90 tablet 2  . fluticasone (FLONASE) 50 MCG/ACT nasal spray SHAKE WELL AND USE 2 SPRAYS IN EACH NOSTRIL DAILY 16 g 6  . gabapentin (NEURONTIN) 100 MG capsule Take 2 capsules (200 mg total) by mouth 3 (three) times daily. 180 capsule 3  . losartan (COZAAR) 100 MG tablet Take 1 tablet (100 mg total) by mouth daily. (Patient taking differently: Take 50 mg by mouth daily. ) 90 tablet 3  . montelukast (SINGULAIR) 10 MG tablet Take 1 tablet (10 mg total) by mouth at bedtime as needed. 30 tablet 3  . Multiple Vitamins-Minerals (MULTIVITAMINS THER. W/MINERALS) TABS tablet Take 1 tablet by mouth daily.    . Omega-3 Fatty Acids (OMEGA-3 FISH OIL) 1200 MG CAPS Take 1,200 mg by mouth daily. Reported on 02/14/2016    . omeprazole (PRILOSEC) 40 MG capsule TAKE 1 CAPSULE(40 MG) BY MOUTH TWICE DAILY 180 capsule 0  . ranitidine (ZANTAC) 300 MG tablet TAKE 1 TABLET(300 MG) BY MOUTH AT BEDTIME AS NEEDED FOR HEARTBURN 30 tablet 0  . simvastatin (ZOCOR) 20 MG tablet TAKE 1 TABLET(20 MG) BY MOUTH DAILY 90 tablet 2  . citalopram (CELEXA) 20 MG tablet TAKE 1 TABLET(20 MG) BY MOUTH DAILY (Patient not taking: Reported on 06/25/2016) 90 tablet 0  . omeprazole (PRILOSEC) 40 MG capsule TAKE 1 CAPSULE(40 MG) BY MOUTH TWICE DAILY (Patient not taking: Reported on 06/25/2016) 180 capsule 0   No current facility-administered medications on file prior to visit.     BP (!) 146/88   Pulse 62   Temp 98.1 F (36.7 C)   Wt 183 lb (83 kg)   SpO2 100%   BMI 28.45 kg/m       Objective:   Physical Exam  General  Mental Status - Alert. General Appearance - Well groomed. Not in acute distress.  Skin Rashes- No Rashes.  HEENT Head- Normal. Ear Auditory Canal - Left- Normal. Right - Normal.Tympanic Membrane- Left- Normal. Right- Normal. Eye Sclera/Conjunctiva- Left- Normal. Right- Normal. Nose & Sinuses Nasal Mucosa- Left-  Boggy and Congested. Right-  Boggy and  Congested.Bilateral  no maxillary and  nofrontal sinus pressure. Mouth & Throat Lips: Upper Lip- Normal: no dryness, cracking, pallor, cyanosis, or vesicular eruption. Lower Lip-Normal: no dryness, cracking, pallor, cyanosis or vesicular eruption. Buccal Mucosa- Bilateral- No Aphthous ulcers. Oropharynx- No Discharge or Erythema. +pnd. Tonsils: Characteristics- Bilateral- No Erythema or Congestion. Size/Enlargement- Bilateral- No enlargement. Discharge- bilateral-None.  Neck Neck- Supple. No Masses.   Chest and Lung Exam Auscultation: Breath Sounds:-Clear even and unlabored.  Cardiovascular Auscultation:Rythm- Regular,  rate and rhythm. Murmurs & Other Heart Sounds:Ausculatation of the heart reveal- No Murmurs.  Lymphatic Head & Neck General Head & Neck Lymphatics: Bilateral: Description- No Localized lymphadenopathy.       Assessment & Plan:  For your allergies with likely eustachian tube dysfuntion we gave depomedrol 40 mg IM. Continue all your current allergy meds.  If you have increasing ear pain despite above treatment. Then you may need antibiotic. I am making one available today for later use if needed.  Would recommend getting flu vaccine later this week when your current symptoms resolve.  Follow up in 7-10 days or as needed  Kelise Kuch, [name], [employer]

## 2016-06-25 NOTE — Patient Instructions (Addendum)
For your allergies with likely eustachian tube dysfuntion we gave depomedrol 40 mg IM. Continue all your current allergy meds.  If you have increasing ear pain despite above treatment. Then you may need antibiotic. I am making one available today for later use if needed.  Would recommend getting flu vaccine later this week when your current symptoms resolve.  Follow up in 7-10 days or as needed

## 2016-07-05 ENCOUNTER — Ambulatory Visit: Payer: Medicare Other | Admitting: *Deleted

## 2016-07-05 ENCOUNTER — Ambulatory Visit (INDEPENDENT_AMBULATORY_CARE_PROVIDER_SITE_OTHER): Payer: Medicare Other

## 2016-07-05 DIAGNOSIS — Z23 Encounter for immunization: Secondary | ICD-10-CM

## 2016-07-10 ENCOUNTER — Other Ambulatory Visit: Payer: Self-pay | Admitting: Family Medicine

## 2016-07-23 DIAGNOSIS — N201 Calculus of ureter: Secondary | ICD-10-CM | POA: Diagnosis not present

## 2016-07-23 DIAGNOSIS — N200 Calculus of kidney: Secondary | ICD-10-CM | POA: Diagnosis not present

## 2016-07-23 DIAGNOSIS — R110 Nausea: Secondary | ICD-10-CM | POA: Diagnosis not present

## 2016-07-24 ENCOUNTER — Telehealth: Payer: Self-pay | Admitting: Neurology

## 2016-07-24 NOTE — Telephone Encounter (Signed)
Pt called in stating he has a kidney stone and will possibly need to come off his blood thinners. He wants to know if this is ok and to call back with directions. He is being seen as a pt at [facility]. Please call [phone]

## 2016-07-24 NOTE — Telephone Encounter (Signed)
Discussed with patient over the phone. He stated that he potentially may have a kidney stone surgery, however, it has not been decided yet. He is taking Plavix for now. And I told him that we generally okay to stop Plavix for 5 days before surgery and restarted after surgery once hemodynamically stable. Also, if the surgeon feels comfortable, we also recommend aspirin 81 at meantime well off Plavix.   He is going to discuss with his urologist, and if his urologist need a letter, he will let us know.  Rosalin Hawking, MD PhD Stroke Neurology 07/24/2016 6:20 PM

## 2016-08-02 DIAGNOSIS — L570 Actinic keratosis: Secondary | ICD-10-CM | POA: Diagnosis not present

## 2016-08-02 DIAGNOSIS — Z85828 Personal history of other malignant neoplasm of skin: Secondary | ICD-10-CM | POA: Diagnosis not present

## 2016-08-02 DIAGNOSIS — L814 Other melanin hyperpigmentation: Secondary | ICD-10-CM | POA: Diagnosis not present

## 2016-08-02 DIAGNOSIS — Z08 Encounter for follow-up examination after completed treatment for malignant neoplasm: Secondary | ICD-10-CM | POA: Diagnosis not present

## 2016-08-02 DIAGNOSIS — L821 Other seborrheic keratosis: Secondary | ICD-10-CM | POA: Diagnosis not present

## 2016-08-13 DIAGNOSIS — Z86718 Personal history of other venous thrombosis and embolism: Secondary | ICD-10-CM | POA: Diagnosis not present

## 2016-08-13 DIAGNOSIS — N200 Calculus of kidney: Secondary | ICD-10-CM | POA: Diagnosis not present

## 2016-08-13 DIAGNOSIS — Z79899 Other long term (current) drug therapy: Secondary | ICD-10-CM | POA: Diagnosis not present

## 2016-08-13 DIAGNOSIS — I10 Essential (primary) hypertension: Secondary | ICD-10-CM | POA: Diagnosis not present

## 2016-08-21 ENCOUNTER — Telehealth: Payer: Self-pay | Admitting: *Deleted

## 2016-08-21 DIAGNOSIS — N200 Calculus of kidney: Secondary | ICD-10-CM | POA: Diagnosis not present

## 2016-08-21 NOTE — Telephone Encounter (Signed)
AWV scheduled for 10/15/16 after PCP appt.

## 2016-09-11 ENCOUNTER — Encounter: Payer: Medicare Other | Admitting: Family Medicine

## 2016-09-14 ENCOUNTER — Emergency Department (HOSPITAL_BASED_OUTPATIENT_CLINIC_OR_DEPARTMENT_OTHER): Payer: Medicare Other

## 2016-09-14 ENCOUNTER — Telehealth: Payer: Self-pay | Admitting: Family Medicine

## 2016-09-14 ENCOUNTER — Encounter (HOSPITAL_BASED_OUTPATIENT_CLINIC_OR_DEPARTMENT_OTHER): Payer: Self-pay | Admitting: *Deleted

## 2016-09-14 ENCOUNTER — Telehealth: Payer: Self-pay | Admitting: Diagnostic Neuroimaging

## 2016-09-14 ENCOUNTER — Emergency Department (HOSPITAL_BASED_OUTPATIENT_CLINIC_OR_DEPARTMENT_OTHER)
Admission: EM | Admit: 2016-09-14 | Discharge: 2016-09-14 | Disposition: A | Discharge status: Home / Self Care | Payer: Medicare Other | Attending: Emergency Medicine | Admitting: Emergency Medicine

## 2016-09-14 DIAGNOSIS — R260 Ataxic gait: Secondary | ICD-10-CM | POA: Diagnosis present

## 2016-09-14 DIAGNOSIS — Z79899 Other long term (current) drug therapy: Secondary | ICD-10-CM | POA: Diagnosis not present

## 2016-09-14 DIAGNOSIS — Y929 Unspecified place or not applicable: Secondary | ICD-10-CM | POA: Diagnosis not present

## 2016-09-14 DIAGNOSIS — W19XXXA Unspecified fall, initial encounter: Secondary | ICD-10-CM | POA: Diagnosis not present

## 2016-09-14 DIAGNOSIS — S0990XA Unspecified injury of head, initial encounter: Secondary | ICD-10-CM | POA: Diagnosis not present

## 2016-09-14 DIAGNOSIS — G459 Transient cerebral ischemic attack, unspecified: Secondary | ICD-10-CM

## 2016-09-14 DIAGNOSIS — Y999 Unspecified external cause status: Secondary | ICD-10-CM | POA: Insufficient documentation

## 2016-09-14 DIAGNOSIS — Y9301 Activity, walking, marching and hiking: Secondary | ICD-10-CM | POA: Diagnosis not present

## 2016-09-14 DIAGNOSIS — J45909 Unspecified asthma, uncomplicated: Secondary | ICD-10-CM | POA: Insufficient documentation

## 2016-09-14 DIAGNOSIS — I10 Essential (primary) hypertension: Secondary | ICD-10-CM | POA: Insufficient documentation

## 2016-09-14 LAB — CBC WITH DIFFERENTIAL/PLATELET
Basophils Absolute: 0 10*3/uL (ref 0.0–0.1)
Basophils Relative: 1 %
Eosinophils Absolute: 0.3 10*3/uL (ref 0.0–0.7)
Eosinophils Relative: 5 %
HCT: 39.9 % (ref 39.0–52.0)
Hemoglobin: 13.3 g/dL (ref 13.0–17.0)
Lymphocytes Relative: 28 %
Lymphs Abs: 1.6 10*3/uL (ref 0.7–4.0)
MCH: 33.1 pg (ref 26.0–34.0)
MCHC: 33.3 g/dL (ref 30.0–36.0)
MCV: 99.3 fL (ref 78.0–100.0)
Monocytes Absolute: 0.4 10*3/uL (ref 0.1–1.0)
Monocytes Relative: 7 %
Neutro Abs: 3.3 10*3/uL (ref 1.7–7.7)
Neutrophils Relative %: 59 %
Platelets: 167 10*3/uL (ref 150–400)
RBC: 4.02 MIL/uL — ABNORMAL LOW (ref 4.22–5.81)
RDW: 12.1 % (ref 11.5–15.5)
WBC: 5.6 10*3/uL (ref 4.0–10.5)

## 2016-09-14 LAB — BASIC METABOLIC PANEL
Anion gap: 7 (ref 5–15)
BUN: 19 mg/dL (ref 6–20)
CO2: 28 mmol/L (ref 22–32)
Calcium: 9.3 mg/dL (ref 8.9–10.3)
Chloride: 101 mmol/L (ref 101–111)
Creatinine, Ser: 1.11 mg/dL (ref 0.61–1.24)
GFR calc Af Amer: >60 mL/min (ref >60)
GFR calc non Af Amer: >60 mL/min (ref >60)
Glucose, Bld: 198 mg/dL — ABNORMAL HIGH (ref 65–99)
Potassium: 3.7 mmol/L (ref 3.5–5.1)
Sodium: 136 mmol/L (ref 135–145)

## 2016-09-14 NOTE — Telephone Encounter (Signed)
Patient called stating that he has had two strokes in the past. He said that just as he was walking the dog he felt very heavy on one side. He is better now but would like to know what he needs to do. Sent to [facility]

## 2016-09-14 NOTE — Telephone Encounter (Signed)
Disregard message. Pt was on the phone with Robin at the time I called.

## 2016-09-14 NOTE — ED Notes (Signed)
ED Provider at bedside. 

## 2016-09-14 NOTE — Telephone Encounter (Signed)
Since it is a long weekend and with his history I would recommend he get evaluated somewhere such as Urgent care or ER. Please follow up with him and see how he is doing

## 2016-09-14 NOTE — Telephone Encounter (Signed)
I spoke to the patient.  He states this morning he took his gabapentin,with no food, then took the dog out.  Felt almost that he was going to pass out. He attributed it to taking his medication without eating.  He now says he is feeling  Better--BUT having a little nausea and swimmy headed. I encouraged him he must go as PCP stated to the ER to be checked out due to current symptoms and his history. He agreed to do so and lives only 2 block from [facility].   He did appreciated the phone call and wanted to thank PCP/assistant for the care/concern he has gotten.  He did want PCP to be aware he does take gabapentin for the hand pain from the stroke. He says the medication does make him feel a little off balance sometimes, but not sure if meds/or from the past strokes.

## 2016-09-14 NOTE — ED Triage Notes (Signed)
He was walking the dog 2 hours ago and almost passed out. He staggered to the right. He drove himself here. Hx of stroke 2 years ago.

## 2016-09-14 NOTE — ED Provider Notes (Signed)
[place] DEPT [facility] Provider Note   CSN: [id number] Arrival date & time: 09/14/16  1330  By signing my name below, I, Sonum Patel, attest that this documentation has been prepared under the direction and in the presence of Virgel Manifold, MD. Electronically Signed: Sonum Patel, Education administrator. 09/14/16. 2:01 PM.  History   Chief Complaint No chief complaint on file.   The history is provided by the patient. No language interpreter was used.     HPI Comments: Paul Drake is a 70 y.o. male who presents to the Emergency Department s/p a sudden onset episode of staggering due to feeling off-balance that occurred PTA and lasted for about 5 seconds. He states he was walking his dog when this occurred and describes it as staggering to his right side. He reports feeling lightheadedness and nausea after he returned home. He called his PCP who advised him to follow up in the ED. He has a history of 2 prior strokes, with deficits to his left side. He states he is able to use his left extremities but he has some numbness to the left fingers and decreased coordination on that side. He currently takes Plavix; previously was on Xarelto. He denies dizziness, HA, CP, vision changes.    Past Medical History:  Diagnosis Date  . Allergic state 12/29/2013  . Arthritis   . Barrett's esophagus   . Chicken pox   . CVA (cerebral infarction)   . Depression   . GERD (gastroesophageal reflux disease)   . HTN (hypertension), benign 04/01/2015  . Hyperglycemia 01/06/2015  . Hyperlipemia   . Hypertension   . IBS (irritable bowel syndrome)   . Kidney stones   . Measles as a child  . Medicare annual wellness visit, subsequent 06/26/2015  . Mumps as a child  . Otitis externa 12/10/2013  . Seasonal allergies    some asthma  . Stroke ([place])   . Unspecified asthma(493.90) 12/29/2013    Patient Active Problem List   Diagnosis Date Noted  . HLD (hyperlipidemia) 02/14/2016  . Cerebrovascular accident (CVA) due to  embolism of right middle cerebral artery ([place]) 08/15/2015  . DVT, lower extremity, right 08/15/2015  . Essential hypertension 08/15/2015  . Medicare annual wellness visit, subsequent 06/26/2015  . HTN (hypertension), benign 04/01/2015  . Eustachian tube dysfunction 03/11/2015  . Hyperglycemia 01/06/2015  . Allergic rhinitis 12/15/2014  . Otitis media 12/15/2014  . Vertigo 12/15/2014  . PFO (patent foramen ovale) 11/24/2014  . DVT, lower extremity ([place]) 10/04/2014  . Stroke ([place]) 09/20/2014  . Barrett's esophagus 08/16/2014  . Lumbago 08/16/2014  . IBS (irritable bowel syndrome) 08/16/2014  . Abdominal pain 07/06/2014  . Urgency of micturation 04/28/2014  . Allergic state 12/29/2013  . Unspecified asthma(493.90) 12/29/2013  . GERD (gastroesophageal reflux disease)   . Need for prophylactic vaccination with combined diphtheria-tetanus-pertussis (DTP) vaccine 11/15/2013  . BPH (benign prostatic hyperplasia) 11/15/2013  . Recurrent nephrolithiasis 11/15/2013  . Depression with anxiety 11/15/2013  . Hyperlipidemia 11/15/2013  . Anosmia 11/15/2013  . History of CVA (cerebrovascular accident) 11/15/2013  . CMC arthritis, thumb, degenerative 11/04/2013    Past Surgical History:  Procedure Laterality Date  . COLONOSCOPY    . LITHOTRIPSY     multiple times  . TEE WITHOUT CARDIOVERSION N/A 09/22/2014   Procedure: TRANSESOPHAGEAL ECHOCARDIOGRAM (TEE);  Surgeon: Josue Hector, MD;  Location[facility] ENDOSCOPY;  Service: Cardiovascular;  Laterality: N/A;  . WISDOM TOOTH EXTRACTION      OB History    No data available  Home Medications    Prior to Admission medications   Medication Sig Start Date End Date Taking? Authorizing Provider  albuterol (PROVENTIL HFA;VENTOLIN HFA) 108 (90 BASE) MCG/ACT inhaler Inhale 2 puffs into the lungs every 6 (six) hours as needed for wheezing or shortness of breath. 12/29/13   Mosie Lukes, MD  amoxicillin-clavulanate (AUGMENTIN) 875-125 MG tablet  Take 1 tablet by mouth 2 (two) times daily. 06/25/16   [name] Saguier, PA-C  azelastine (ASTELIN) 0.1 % nasal spray Place 2 sprays into both nostrils 2 (two) times daily. Use in each nostril as directed 10/20/15   Mosie Lukes, MD  Cetirizine HCl (ZYRTEC ALLERGY PO) Take 10 mg by mouth daily.     Historical Provider, MD  citalopram (CELEXA) 20 MG tablet TAKE 1 TABLET(20 MG) BY MOUTH DAILY 03/06/16   Mosie Lukes, MD  citalopram (CELEXA) 20 MG tablet TAKE 1 TABLET(20 MG) BY MOUTH DAILY Patient not taking: Reported on 06/25/2016 06/17/16   Mosie Lukes, MD  clopidogrel (PLAVIX) 75 MG tablet TAKE 1 TABLET(75 MG) BY MOUTH DAILY 05/14/16   Rosalin Hawking, MD  fluticasone (FLONASE) 50 MCG/ACT nasal spray SHAKE WELL AND USE 2 SPRAYS IN EACH NOSTRIL DAILY 03/29/16   Mosie Lukes, MD  gabapentin (NEURONTIN) 100 MG capsule Take 2 capsules (200 mg total) by mouth 3 (three) times daily. 10/31/15   Mosie Lukes, MD  losartan (COZAAR) 100 MG tablet TAKE 1 TABLET(100 MG) BY MOUTH DAILY 07/10/16   Mosie Lukes, MD  montelukast (SINGULAIR) 10 MG tablet Take 1 tablet (10 mg total) by mouth at bedtime as needed. 12/31/14   Mosie Lukes, MD  Multiple Vitamins-Minerals (MULTIVITAMINS THER. W/MINERALS) TABS tablet Take 1 tablet by mouth daily.    Historical Provider, MD  Omega-3 Fatty Acids (OMEGA-3 FISH OIL) 1200 MG CAPS Take 1,200 mg by mouth daily. Reported on 02/14/2016    Historical Provider, MD  omeprazole (PRILOSEC) 40 MG capsule TAKE 1 CAPSULE(40 MG) BY MOUTH TWICE DAILY 03/08/16   Mosie Lukes, MD  omeprazole (PRILOSEC) 40 MG capsule TAKE 1 CAPSULE(40 MG) BY MOUTH TWICE DAILY Patient not taking: Reported on 06/25/2016 06/15/16   Mosie Lukes, MD  ranitidine (ZANTAC) 300 MG tablet TAKE 1 TABLET(300 MG) BY MOUTH AT BEDTIME AS NEEDED FOR HEARTBURN 05/01/16   Mosie Lukes, MD  simvastatin (ZOCOR) 20 MG tablet TAKE 1 TABLET(20 MG) BY MOUTH DAILY 06/19/16   Mosie Lukes, MD    Family History Family History    Problem Relation Age of Onset  . Hypertension Mother   . Hyperlipidemia Mother   . Fibromyalgia Mother   . Arthritis Mother     rheumatoid  . Diabetes Sister     type 2  . Hyperlipidemia Brother   . Hypertension Brother   . Ulcers Father 36    Bleeding Ulcers  . Kidney Stones Daughter   . Asthma Daughter   . Healthy Son   . Cancer Maternal Grandfather     skin ?  . Stroke Maternal Aunt   . Cancer Maternal Uncle     prostate    Social History Social History  Substance Use Topics  . Smoking status: Never Smoker  . Smokeless tobacco: Never Used  . Alcohol use 0.6 oz/week    1 Shots of liquor per week     Comment: 1-2 occasioinal     Allergies   Pollen extract and Seasonal ic [cholestatin]   Review of Systems Review of  Systems  A complete 10 system review of systems was obtained and all systems are negative except as noted in the HPI and PMH.    Physical Exam Updated Vital Signs BP 140/85   Pulse 74   Temp 97.8 F (36.6 C) (Oral)   Resp 16   Ht 5' 7.5" (1.715 m)   Wt 183 lb (83 kg)   SpO2 100%   BMI 28.24 kg/m   Physical Exam  Constitutional: He is oriented to person, place, and time. He appears well-developed and well-nourished.  HENT:  Head: Normocephalic and atraumatic.  Eyes: EOM are normal. Pupils are equal, round, and reactive to light.  Cardiovascular: Regular rhythm.   Pulmonary/Chest: Effort normal.  Abdominal: Soft.  Musculoskeletal: Normal range of motion.  Neurological: He is alert and oriented to person, place, and time. He displays normal reflexes. No cranial nerve deficit or sensory deficit. He exhibits normal muscle tone. Coordination normal.  5/5 strength in major muscle groups of  bilateral upper and lower extremities. Speech normal. Normal finger to nose testing.   Nursing note and vitals reviewed.    ED Treatments / Results  DIAGNOSTIC STUDIES: Oxygen Saturation is 100% on RA, normal by my interpretation.    COORDINATION OF  CARE: 2:01 PM Discussed treatment plan with pt at bedside and pt agreed to plan.   Labs (all labs ordered are listed, but only abnormal results are displayed) Labs Reviewed  CBC WITH DIFFERENTIAL/PLATELET - Abnormal; Notable for the following:       Result Value   RBC 4.02 (*)    All other components within normal limits  BASIC METABOLIC PANEL - Abnormal; Notable for the following:    Glucose, Bld 198 (*)    All other components within normal limits    EKG  EKG Interpretation  Date/Time:  Friday September 14 2016 13:45:25 EST Ventricular Rate:  74 PR Interval:    QRS Duration: 92 QT Interval:  397 QTC Calculation: 441 R Axis:   24 Text Interpretation:  Sinus rhythm No significant change was found Confirmed by CAMPOS  MD, Lennette Bihari ([id number]) on 09/15/2016 5:34:31 PM       Radiology Ct Head Wo Contrast  Result Date: 09/14/2016 CLINICAL DATA:  Fall onto the right side. EXAM: CT HEAD WITHOUT CONTRAST TECHNIQUE: Contiguous axial images were obtained from the base of the skull through the vertex without intravenous contrast. COMPARISON:  09/21/2014 FINDINGS: Brain: No evidence of acute infarction, hemorrhage, extra-axial collection, ventriculomegaly, or mass effect. Old right parietal lobe infarct with encephalomalacia. Generalized cerebral atrophy. Periventricular white matter low attenuation likely secondary to microangiopathy. Vascular: Cerebrovascular atherosclerotic calcifications are noted. Skull: Negative for fracture or focal lesion. Sinuses/Orbits: Visualized portions of the orbits are unremarkable. Visualized portions of the paranasal sinuses and mastoid air cells are unremarkable. Other: None. IMPRESSION: 1. No acute intracranial pathology. 2. Chronic microvascular disease and cerebral atrophy. Electronically Signed   By: Kathreen Devoid   On: 09/14/2016 14:37    Procedures Procedures (including critical care time)  Medications Ordered in ED Medications - No data to  display   Initial Impression / Assessment and Plan / ED Course  I have reviewed the triage vital signs and the nursing notes.  Pertinent labs & imaging results that were available during my care of the patient were reviewed by me and considered in my medical decision making (see chart for details).  Clinical Course    70yM with brief episode where he fel like he was falling towards his  R side. He does not clearly describe vertigo. I don't get the impression that this was near syncope. Symptoms are concerning for possible TIA.   Symptoms have since resolved with no reoccurences. Currently no complaints. Neuro exam nonfocal and he has been ambulating in the ED. CT of the head w/o acute abnormality although CT is even more limited with pathology in posterior fossa. No MRI available at [facility] today.   Carotid dopplers 09/2014 with no flow limiting stenosis and vertebral arteries patent with antegrade flow. Previous ECHO with small PFO with RLS only on valsalva maneuver by TEE. TCD bubble study showed grade 1 RLS at rest and agrees 3 RLS with Valsalva.  Pt is anxious to go home. I think it would be more prudent to admit for TIA. It has been a couple years since some of his previous studies. He has medical decision making capability and understands risks as well of benefits of hsopitalization. He is on plavix and zocor. He has established neurology care. He seems pretty reliable.    Final Clinical Impressions(s) / ED Diagnoses   Final diagnoses:  Transient cerebral ischemia, unspecified type    New Prescriptions New Prescriptions   No medications on file   I personally preformed the services scribed in my presence. The recorded information has been reviewed is accurate. Virgel Manifold, MD.    Virgel Manifold, MD 09/17/16 [phone]

## 2016-09-14 NOTE — Telephone Encounter (Signed)
Noted. [facility] advised ER [facility]uss[facility] ED).  Called to follow up with patient.  Left a message for call back.

## 2016-09-14 NOTE — Telephone Encounter (Signed)
Patient called. Had episode today (? TIA) and went to ER. CT was negative. He was d/c'd home. He was recommended to setup appt with Dr. Erlinda Hong.  Will forward to [name] (RN) to setup next available appt with Dr. Erlinda Hong.   Penni Bombard, MD [id number], 0000000 PM Certified in Neurology, Neurophysiology and Neuroimaging  [facility] [address] [place], [place] [address] [phone]

## 2016-09-14 NOTE — ED Notes (Signed)
Patient returned from CT with tech via stretcher 

## 2016-09-14 NOTE — Telephone Encounter (Signed)
Noted  

## 2016-09-14 NOTE — Telephone Encounter (Signed)
[facility] - Client TELEPHONE ADVICE RECORD [facility] Patient Name: PECOS MIMNAUGH DOB: 1946-04-13 Initial Comment Caller states that he has a HX of CVA (2008, Feb 2017), He said he felt "heavy" on his right side Nurse Assessment Nurse: Markus Daft, RN, Sherre Poot Date/Time (Eastern Time): 09/14/2016 12:10:36 PM Confirm and document reason for call. If symptomatic, describe symptoms. ---Caller states while walking his dog, he was stumbling to the right side. It felt "heavy." Denies any new N/T. - Now he is able to walk normal. - Left hand fingers/hand, and left leg below the knee stay numb/tingling - residual from other CVA. Does the patient have any new or worsening symptoms? ---Yes Will a triage be completed? ---Yes Related visit to physician within the last 2 weeks? ---No Does the PT have any chronic conditions? (i.e. diabetes, asthma, etc.) ---Yes List chronic conditions. ---HTN, CVA x 2 Is this a behavioral health or substance abuse call? ---No Guidelines Guideline Title Affirmed Question Affirmed Notes Neurologic Deficit [1] Weakness (i.e., paralysis, loss of muscle strength) of the face, arm / hand, or leg / foot on one side of the body AND [2] sudden onset AND [3] brief (now gone) Final Disposition User Go to ED Now (or PCP triage) Markus Daft, RN, [facility] - ED Disagree/Comply: Comply

## 2016-09-17 NOTE — Telephone Encounter (Signed)
Thank you, Vik. Will has [place] working on that.   Ameya Kutz

## 2016-09-18 ENCOUNTER — Other Ambulatory Visit: Payer: Self-pay | Admitting: Family Medicine

## 2016-09-19 NOTE — Telephone Encounter (Signed)
Pt called, an appt was scheduled for 09/26/16, he is aware Dr Erlinda Hong will be back in the clinic next week.  Pt is asking if RN would touch base with him. He can be reached at [phone]

## 2016-09-26 ENCOUNTER — Ambulatory Visit (INDEPENDENT_AMBULATORY_CARE_PROVIDER_SITE_OTHER): Payer: Medicare Other | Admitting: Neurology

## 2016-09-26 ENCOUNTER — Encounter: Payer: Self-pay | Admitting: Neurology

## 2016-09-26 VITALS — BP 104/72 | HR 62 | Wt 182.4 lb

## 2016-09-26 DIAGNOSIS — I82531 Chronic embolism and thrombosis of right popliteal vein: Secondary | ICD-10-CM

## 2016-09-26 DIAGNOSIS — Z8673 Personal history of transient ischemic attack (TIA), and cerebral infarction without residual deficits: Secondary | ICD-10-CM | POA: Diagnosis not present

## 2016-09-26 DIAGNOSIS — Q2112 Patent foramen ovale: Secondary | ICD-10-CM

## 2016-09-26 DIAGNOSIS — Q211 Atrial septal defect: Secondary | ICD-10-CM

## 2016-09-26 DIAGNOSIS — G450 Vertebro-basilar artery syndrome: Secondary | ICD-10-CM

## 2016-09-26 DIAGNOSIS — G459 Transient cerebral ischemic attack, unspecified: Secondary | ICD-10-CM | POA: Insufficient documentation

## 2016-09-26 NOTE — Patient Instructions (Addendum)
-   will check LE venous doppler - will check CTA head and neck to evaluate posterior vessels - may consider heart monitoring depending on above results - continue plavix and zocor for stroke prevention for now - continue neurontin for the numbness and pain. You may adjust the dose for your symptoms - BP relatively low. Check BP at home and record and bring over to PCP for medication adjustment if needed. BP goal 120-130 - Follow up with your primary care physician for stroke risk factor modification. Recommend maintain blood pressure goal 120-130/80, diabetes with hemoglobin A1c goal below 6.5% and lipids with LDL cholesterol goal below 70 mg/dL.  - follow up in 3 months with me.

## 2016-09-26 NOTE — Progress Notes (Signed)
STROKE NEUROLOGY FOLLOW UP NOTE  NAME: Paul Drake DOB: 09-22-1945  REASON FOR VISIT: stroke follow up HISTORY FROM: pt and chart  Today we had the pleasure of seeing Paul Drake in follow-up at our Neurology Clinic. Pt was accompanied by no one.   History Summary Paul Drake is a LH 71 y.o. male with a history of right MCA infarct in 2008 with residue LLE minimal weakness was admitted on 09/20/14 for acute onset left sided weakness and difficulty with speech.he received tPA. His acute right frontal stroke is adjacent to your previous stroke in 2008 shown in MRI. Stroke work up showed PFO with valsalva on TEE and positive right LE DVT. He was put on Xarelto 15mg  twice a day for 21 days and then 20mg  daily for 5 months. He mentioned that before this happened he also had some diarrhea and he might have done valsalva maneuvers then.   Patient's previous stroke was in 2008 and the patient has a resultant LLE numbness and weakness. Patient was on ASA but stopped it about a month ago due to gastritis.He denies N/V. However, shortly after the gastritis, he developed kidney stone. He may have a lot of pain which caused him to have valsalva maneuvers.   Follow up 11/24/14 - the patient has been doing well. No recurrent stroke like symptoms. He still has left hand numbness which causing trouble with writing and handling tools. He is on Xarelto daily. He followed up with his PCP closely. His BP today in clinic is 136/82. He still has OT twice a week but will soon be completed. His hypercoagulable work up in hospital was negative.   05/09/15 follow up - the patient has been doing well, no recurrent strokelike symptoms. He is still on Xarelto without side effect. However he continued to suffer from kidney stone, sometimes really painful. Urologist hesitant to do lithotripsy due to bleeding risk. He has been on Xarelto for more than 6 months now. He still reports intermittent left-sided numbness and pain,  especially fingers at left hand. No other complaints, blood pressure 125/78. TCD confirmed PFO and no MES on 30 min monitoring.  08/15/15 follow up - pt has been doing well without stroke like symptoms on plavix. His kidney stone was taken out by cystoscopy and did not require surgery. He continues to complain of left hand numbness and tingling, feeling swallow. However, he only takes once a day dose of neurontin and feels drowsiness with neurontin. BP 123/75 in clinic today.    02/14/16 follow up - pt has been doing well from stroke standpoint. Still has left hand numbness especially after active exercise with golf practice. Had kidney stone removal and abdominal pain much improved. However, recently started again belly pain but in different location, more epigastric pain, had CT abdomen which was negative. Considered diverticulitis and put on Abx last week, and seems getting better. BP today was low, 95/62, will decrease losartan dose.   Interval History During the interval time, pt had kidney stone surgery in 07/2016 and off plavix for 5-7 days and currently plavix resumed. However, on 09/14/16, he was walking on the street, had sudden onset staggering, imbalance and kept leaning towards right. He felt dizzy, but not vertigo, no fall, no double vision or speech difficulty. No vomiting but mild nausea. It lasted 15-20 sec and resolved. He managed to get home. Wife called EMS and he was sent to ED. CT head negative. He refused further study and come today for follow  up. Denies leg pain or swelling. BP 104/72   REVIEW OF SYSTEMS: Full 14 system review of systems performed and notable only for those listed below and in HPI above, all others are negative:  Constitutional:   Cardiovascular:  Ear/Nose/Throat:  Runny nose Skin:  Eyes:  Eye discharge Respiratory:   Gastroitestinal:  Abdominal pain Genitourinary: frequency of urination and urgency Hematology/Lymphatic:   Endocrine:  Musculoskeletal:  neck pain and neck stiffiness Allergy/Immunology:   Neurological:  dizziness Psychiatric: depression Sleep:  The following represents the patient's updated allergies and side effects list: Allergies  Allergen Reactions  . Pollen Extract     Runny nose,itchy eyes  . Seasonal Ic [Cholestatin]     Itchy eyes and runny nose    The neurologically relevant items on the patient's problem list were reviewed on today's visit.  Neurologic Examination  A problem focused neurological exam (12 or more points of the single system neurologic examination, vital signs counts as 1 point, cranial nerves count for 8 points) was performed.  Blood pressure 104/72, pulse 62, weight 182 lb 6.4 oz (82.7 kg).  General - Well nourished, well developed, in no apparent distress.  Ophthalmologic - Sharp disc margins OU.  Cardiovascular - Regular rate and rhythm with no murmur.  Mental Status -  Level of arousal and orientation to time, place, and person were intact. Language including expression, naming, repetition, comprehension, reading, and writing was assessed and found intact. Attention span and concentration were normal. Recent and remote memory were intact. Fund of Knowledge was assessed and was intact.  Cranial Nerves II - XII - II - Visual field intact OU. III, IV, VI - Extraocular movements intact. V - Facial sensation intact bilaterally. VII - Facial movement intact bilaterally. VIII - Hearing & vestibular intact bilaterally. X - Palate elevates symmetrically. XI - Chin turning & shoulder shrug intact bilaterally. XII - Tongue protrusion intact.  Motor Strength - The patient's strength was normal in all extremities and pronator drift was absent.  Bulk was normal and fasciculations were absent.   Motor Tone - Muscle tone was assessed at the neck and appendages and was normal.  Reflexes - The patient's reflexes were normal in all extremities and he had no pathological reflexes.  Sensory  - Light touch, temperature/pinprick decreased on the left ulnar hand and all fingertips and Romberg testing were assessed and were normal.    Coordination - The patient had normal movements in the hands and feet with no ataxia or dysmetria.  Tremor was absent.  Gait and Station - The patient's transfers, posture, gait, station, and turns were observed as normal.  Data reviewed: I personally reviewed the images and agree with the radiology interpretations.  Ct Head (brain) Wo Contrast  09/20/2014 IMPRESSION: 1. No acute intracranial pathology seen on CT. 2. Chronic infarct at the high right parietal region, with associated encephalomalacia. 3. Mild cortical volume loss and scattered small vessel ischemic microangiopathy.   Carotid Doppler Bilateral: 1-39% ICA stenosis. Vertebral artery flow is antegrade.  MRI HEAD Moderate size remote infarct posterior right frontal -parietal lobe with encephalomalacia. Small to moderate size acute infarct along the anterior margin of this remote infarct involving the posterior right frontal lobe extending to the posterior right operculum region. No intracranial hemorrhage.  MRA HEAD Decrease number of visualized right middle cerebral artery branch vessels consistent with patient's remote and acute infarct. No significant stenosis proximal to the branch vessel level. Ectatic vertebral arteries and basilar artery with slight irregularity of  the distal left vertebral artery without significant stenosis. Nonvisualized right posterior inferior cerebellar artery. Mild posterior cerebral artery branch vessel irregularity and narrowing more notable on the right.  EKG normal EKG, normal sinus rhythm. For complete results please see formal report.  LE venous doppler - The right lower extremity is positive for deep vein thrombosis involving the right gastrocnemius, posterior tibial, and peroneal veins. There is no evidence of left lower extremity deep  vein thrombosis.  TEE -  1) PFO by bubble study not visualized Positive bubble study only with valsalva 2) Normal EF 60% 3) No LAA thrombus 4) Trivial MR 5) Mild AR 6) No aortic debris 7) Mild TR 8) Normal RV  TCD bubble study positive with grade I at rest and grade III with valsalva.  TCD emboli detection - negative for MES.    Assessment: As you may recall, he is a 71 y.o. Caucasian male with PMH of right MCA CVA in 2008 was admitted on1/4/16 for again right MCA small stroke. The first stroke more at right motor strip and the last stroke more at sensory strip. Stroke w/u showed positive DVT and a likely small PFO with RLS only on valsalva maneuver by TEE. However, TCD bubble study showed grade 1 RLS at rest and agrees 3 RLS with Valsalva maneuver. TCD MES negative. For the two strokes, it is likely pt had some valsalva maneuver prior to the strokes. He was on Xarelto for more than 6 months and due to kidney stone surgery with bleeding risk, he optioned to switch Xarelto to plavix for stroke prevention. Low-dose Neurontin for left hand numbness/pain. Had TIA symptoms with leaning towards right and imbalance gait on 09/14/16. CT negative. Due to history of embolic stroke, will need further work up.   Plan:  - will check LE venous doppler - will check CTA head and neck to evaluate posterior circulation - may consider heart monitoring depending on above results - continue plavix and zocor for stroke prevention for now - continue neurontin for the numbness and pain. Adjust the dose self for symptoms - BP relatively low. Check BP at home and record and bring over to PCP for medication adjustment if needed. BP goal 120-130 - Follow up with your primary care physician for stroke risk factor modification. Recommend maintain blood pressure goal 120-130/80, diabetes with hemoglobin A1c goal below 6.5% and lipids with LDL cholesterol goal below 70 mg/dL.  - follow up in 3 months with me.  I spent  more than 25 minutes of face to face time with the patient. Greater than 50% of time was spent in counseling and coordination of care. We discussed TIA further work up and BP monitoring.    Orders Placed This Encounter  Procedures  . CT ANGIO NECK W OR WO CONTRAST    Standing Status:   Future    Standing Expiration Date:   11/24/2017    Order Specific Question:   If indicated for the ordered procedure, I authorize the administration of contrast media per Radiology protocol    Answer:   Yes    Order Specific Question:   Reason for Exam (SYMPTOM  OR DIAGNOSIS REQUIRED)    Answer:   TIA    Order Specific Question:   Preferred imaging location?    Answer:   Internal  . CT ANGIO HEAD W OR WO CONTRAST    Standing Status:   Future    Standing Expiration Date:   11/24/2017    Order Specific  Question:   If indicated for the ordered procedure, I authorize the administration of contrast media per Radiology protocol    Answer:   Yes    Order Specific Question:   Reason for Exam (SYMPTOM  OR DIAGNOSIS REQUIRED)    Answer:   TIA    Order Specific Question:   Preferred imaging location?    Answer:   Internal    Meds ordered this encounter  Medications  . cetirizine (ZYRTEC) 10 MG tablet    Sig: Take 10 mg by mouth.  . DISCONTD: citalopram (CELEXA) 20 MG tablet    Sig: Take 20 mg by mouth.  . DISCONTD: clopidogrel (PLAVIX) 75 MG tablet    Sig: Take 75 mg by mouth.  . gabapentin (NEURONTIN) 100 MG capsule    Sig: Take 100 mg by mouth.  . DISCONTD: losartan (COZAAR) 100 MG tablet    Sig: Take 100 mg by mouth.  . DISCONTD: ranitidine (ZANTAC) 300 MG tablet  . DISCONTD: simvastatin (ZOCOR) 20 MG tablet    Sig: Take 20 mg by mouth.  . DISCONTD: Flaxseed, Linseed, (FLAXSEED OIL) 1000 MG CAPS    Sig: Take by mouth.  . oxyCODONE-acetaminophen (PERCOCET/ROXICET) 5-325 MG tablet    Sig: TK 1-2 TS EVERY 4-6 HOURS PRN P    Refill:  0    Patient Instructions  - will check LE venous doppler - will  check CTA head and neck to evaluate posterior vessels - may consider heart monitoring depending on above results - continue plavix and zocor for stroke prevention for now - continue neurontin for the numbness and pain. You may adjust the dose for your symptoms - BP relatively low. Check BP at home and record and bring over to PCP for medication adjustment if needed. BP goal 120-130 - Follow up with your primary care physician for stroke risk factor modification. Recommend maintain blood pressure goal 120-130/80, diabetes with hemoglobin A1c goal below 6.5% and lipids with LDL cholesterol goal below 70 mg/dL.  - follow up in 3 months with me.   Rosalin Hawking, MD PhD [facility] [address] [place], [place] [address] [phone]

## 2016-09-27 ENCOUNTER — Ambulatory Visit (HOSPITAL_COMMUNITY)
Admission: RE | Admit: 2016-09-27 | Discharge: 2016-09-27 | Disposition: A | Discharge status: Home / Self Care | Payer: Medicare Other | Source: Ambulatory Visit | Attending: Neurology | Admitting: Neurology

## 2016-09-27 DIAGNOSIS — G450 Vertebro-basilar artery syndrome: Secondary | ICD-10-CM | POA: Insufficient documentation

## 2016-09-27 NOTE — Progress Notes (Signed)
Preliminary results by tech - Venous Duplex Lower Ext. Completed. Negative for deep and superficial vein thrombosis in both legs.  Haille Pardi, BS, RDMS, RVT  

## 2016-09-28 ENCOUNTER — Telehealth: Payer: Self-pay

## 2016-09-28 NOTE — Telephone Encounter (Signed)
Rn call patient back about the lower extremity venous doppler test. Rn stated it was negative for venous clot. Pt verbalized understanding.

## 2016-09-28 NOTE — Telephone Encounter (Signed)
-----   Message from Rosalin Hawking, MD sent at 09/27/2016  2:20 PM EST ----- Could you please let the patient know that the lower extremity venous doppler test done yesterday was negative for venous clot. Please continue current treatment. Thanks.  Rosalin Hawking, MD PhD Stroke Neurology 09/27/2016 2:20 PM

## 2016-10-01 ENCOUNTER — Ambulatory Visit
Admission: RE | Admit: 2016-10-01 | Discharge: 2016-10-01 | Disposition: A | Discharge status: Home / Self Care | Payer: Medicare Other | Source: Ambulatory Visit | Attending: Neurology | Admitting: Neurology

## 2016-10-01 ENCOUNTER — Telehealth: Payer: Self-pay | Admitting: Neurology

## 2016-10-01 DIAGNOSIS — G450 Vertebro-basilar artery syndrome: Secondary | ICD-10-CM

## 2016-10-01 DIAGNOSIS — I63411 Cerebral infarction due to embolism of right middle cerebral artery: Secondary | ICD-10-CM

## 2016-10-01 MED ORDER — IOPAMIDOL (ISOVUE-370) INJECTION 76%
75.0000 mL | Freq: Once | INTRAVENOUS | Status: AC | PRN
  Administered 2016-10-01: 75 mL via INTRAVENOUS

## 2016-10-01 NOTE — Telephone Encounter (Signed)
Called pt regarding his CTA head and neck result. His CTA head and neck is unremarkable. I told him that next plan will be loop recorder monitoring to rule out A. Fib. He expressed an standing and appreciation.  Pt had right MCA cortical embolic CVA x 2, the second one was 2 years ago. At that time, he had TEE showed small PFO but also DVT, so stroke was considered related to PFO with DVT. But he had again recent TIA episode but negative for DVT this time. Needs to rule out afib. Pt agree with loop monitoring. Will refer to Dr. Rayann Heman for loop consideration.   Rosalin Hawking, MD PhD Stroke Neurology 10/01/2016 5:49 PM  Orders Placed This Encounter  Procedures  . Ambulatory referral to Cardiac Electrophysiology    Referral Priority:   Routine    Referral Type:   Consultation    Referral Reason:   Specialty Services Required    Referred to Provider:   Thompson Grayer, MD    Requested Specialty:   Cardiology    Number of Visits Requested:   1

## 2016-10-10 ENCOUNTER — Telehealth (HOSPITAL_COMMUNITY): Payer: Self-pay | Admitting: Nurse Practitioner

## 2016-10-10 NOTE — Telephone Encounter (Signed)
Received request from Dr Erlinda Hong for patient to have ILR implanted for cryptogenic stroke.  Called and spoke with patient, reviewed procedure and answered questions.  He would like to proceed and meet with Dr Rayann Heman morning of procedure at hospital. Scheduled for 10/16/16 at 7AM. Pt to arrive at short stay at 6:30AM. No special instructions, advised he could drive himself home.  Reviewed wound care and follow up/monitoring schedule and fees.  Chanetta Marshall, NP 10/10/2016 11:53 AM

## 2016-10-11 ENCOUNTER — Telehealth: Payer: Self-pay | Admitting: Family Medicine

## 2016-10-11 NOTE — Telephone Encounter (Addendum)
06/16/15 PR PPPS, SUBSEQ VISIT [id number] Patient scheduled medicare wellness appointment with Glenard Haring 10/15/16 at 1pm and follow up with PCP at 1:30pm.

## 2016-10-12 NOTE — Progress Notes (Signed)
Pre visit review using our clinic review tool, if applicable. No additional management support is needed unless otherwise documented below in the visit note. 

## 2016-10-12 NOTE — Progress Notes (Signed)
Subjective:   Paul Drake is a 71 y.o. male who presents for Medicare Annual/Subsequent preventive examination.  Review of Systems:  No ROS.  Medicare Wellness Visit.  Cardiac Risk Factors include: advanced age (>27men, >10 women);dyslipidemia;hypertension Sleep patterns: Sleeps 8 hrs per night. Feels rested. Home Safety/Smoke Alarms:  Feels safe in home. Smoke alarms in place.  Living environment; residence and Firearm Safety: Lives with wife and dog in 2 story home. Seat Belt Safety/Bike Helmet: Wears seat belt.   Counseling:   Eye Exam- Wears glasses. Follows with [facility] annually. Dental- Dr.Howell every 6 months.  Male:   CCS-  Last  09/30/13:Normal per pt PSA- No results found for: PSA      Objective:    Vitals: BP 120/76 (BP Location: Right Arm, Patient Position: Sitting, Cuff Size: Normal)   Pulse 70   Ht 5\' 8"  (1.727 m)   Wt 180 lb 12.8 oz (82 kg)   SpO2 98%   BMI 27.49 kg/m   Body mass index is 27.49 kg/m.  Tobacco History  Smoking Status  . Never Smoker  Smokeless Tobacco  . Never Used     Counseling given: No   Past Medical History:  Diagnosis Date  . Allergic state 12/29/2013  . Arthritis   . Barrett's esophagus   . Chicken pox   . CVA (cerebral infarction)   . Depression   . GERD (gastroesophageal reflux disease)   . HTN (hypertension), benign 04/01/2015  . Hyperglycemia 01/06/2015  . Hyperlipemia   . Hypertension   . IBS (irritable bowel syndrome)   . Kidney stones   . Measles as a child  . Medicare annual wellness visit, subsequent 06/26/2015  . Mumps as a child  . Otitis externa 12/10/2013  . Seasonal allergies    some asthma  . Stroke ([place])   . Unspecified asthma(493.90) 12/29/2013   Past Surgical History:  Procedure Laterality Date  . COLONOSCOPY    . LITHOTRIPSY     multiple times  . TEE WITHOUT CARDIOVERSION N/A 09/22/2014   Procedure: TRANSESOPHAGEAL ECHOCARDIOGRAM (TEE);  Surgeon: Josue Hector, MD;  Location: [facility]  ENDOSCOPY;  Service: Cardiovascular;  Laterality: N/A;  . WISDOM TOOTH EXTRACTION     Family History  Problem Relation Age of Onset  . Hypertension Mother   . Hyperlipidemia Mother   . Fibromyalgia Mother   . Arthritis Mother     rheumatoid  . Diabetes Sister     type 2  . Hyperlipidemia Brother   . Hypertension Brother   . Ulcers Father 36    Bleeding Ulcers  . Kidney Stones Daughter   . Asthma Daughter   . Healthy Son   . Cancer Maternal Grandfather     skin ?  . Stroke Maternal Aunt   . Cancer Maternal Uncle     prostate   History  Sexual Activity  . 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 albuterol (PROVENTIL HFA;VENTOLIN HFA) 108 (90 BASE) MCG/ACT inhaler Inhale 2 puffs into the lungs every 6 (six) hours as needed for wheezing or shortness of breath.  . cetirizine (ZYRTEC) 10 MG tablet Take 10 mg by mouth.  . citalopram (CELEXA) 20 MG tablet TAKE 1 TABLET(20 MG) BY MOUTH DAILY  . clopidogrel (PLAVIX) 75 MG tablet TAKE 1 TABLET(75 MG) BY MOUTH DAILY  . fluticasone (FLONASE) 50 MCG/ACT nasal spray  SHAKE WELL AND USE 2 SPRAYS IN EACH NOSTRIL DAILY  . gabapentin (NEURONTIN) 100 MG capsule Take 2 capsules (200 mg total) by mouth 3 (three) times daily.  Marland Kitchen losartan (COZAAR) 100 MG tablet TAKE 1 TABLET(100 MG) BY MOUTH DAILY  . Multiple Vitamins-Minerals (MULTIVITAMINS THER. W/MINERALS) TABS tablet Take 1 tablet by mouth daily.  Marland Kitchen Naphazoline-Pheniramine (VISINE-A OP) Apply 1 drop to eye daily as needed (allergies).  . Omega-3 Fatty Acids (OMEGA-3 PO) Take 1 capsule by mouth daily.  Marland Kitchen omeprazole (PRILOSEC) 40 MG capsule TAKE 1 CAPSULE(40 MG) BY MOUTH TWICE DAILY  . OVER THE COUNTER MEDICATION Take 1 tablet by mouth 2 (two) times daily. methylation complete supplement  . oxyCODONE-acetaminophen (PERCOCET/ROXICET) 5-325 MG tablet Take 1 tablet  daily as needed for severe pain  . Simethicone (GAS-X PO) Take 2 tablets by mouth daily as needed (gas).  . simvastatin (ZOCOR) 20 MG tablet TAKE 1 TABLET(20 MG) BY MOUTH DAILY   No facility-administered encounter medications on file as of 10/15/2016.     Activities of Daily Living In your present state of health, do you have any difficulty performing the following activities: 10/15/2016 02/03/2016  Hearing? N N  Vision? N N  Difficulty concentrating or making decisions? N N  Walking or climbing stairs? N N  Dressing or bathing? N N  Doing errands, shopping? N N  Preparing Food and eating ? N -  Using the Toilet? N -  In the past six months, have you accidently leaked urine? N -  Do you have problems with loss of bowel control? N -  Managing your Medications? N -  Managing your Finances? N -  Housekeeping or managing your Housekeeping? N -  Some recent data might be hidden    Patient Care Team: Mosie Lukes, MD as PCP - General (Family Medicine) Rosalin Hawking, MD as Consulting Physician (Neurology) Christy Sartorius, MD as Referring Physician (Urology) Ladene Artist, MD as Consulting Physician (Gastroenterology)   Assessment:    Physical assessment deferred to PCP.  Exercise Activities and Dietary recommendations Current Exercise Habits: Home exercise routine, Time (Minutes): 30, Frequency (Times/Week): 3, Weekly Exercise (Minutes/Week): 90, Intensity: Mild   Diet (meal preparation, eat out, water intake, caffeinated beverages, dairy products, fruits and vegetables): in general, a "healthy" diet    Goals    . Maintain healthy lifestyle.      Fall Risk Fall Risk  10/15/2016 09/26/2016 02/14/2016 02/03/2016 12/31/2014  Falls in the past year? No No No No No   Depression Screen PHQ 2/9 Scores 10/15/2016 02/03/2016 12/31/2014  PHQ - 2 Score 0 0 2    Cognitive Function MMSE - Mini Mental State Exam 10/15/2016  Orientation to time 5  Orientation to Place 5  Registration 3    Attention/ Calculation 5  Recall 3  Language- name 2 objects 2  Language- repeat 1  Language- follow 3 step command 3  Language- read & follow direction 1  Write a sentence 1  Copy design 1  Total score 30        Immunization History  Administered Date(s) Administered  . Influenza,inj,Quad PF,36+ Mos 05/17/2014, 05/20/2015, 07/05/2016  . Influenza-Unspecified 06/17/2013  . Pneumococcal-Unspecified 06/17/2013  . Tdap 11/11/2013   Screening Tests Health Maintenance  Topic Date Due  . ZOSTAVAX  10/15/2017 (Originally 06/02/2004)  . PNA vac Low Risk Adult (2 of 2 - PCV13) 10/15/2017 (Originally 06/17/2014)  . COLONOSCOPY  10/01/2023  . TETANUS/TDAP  11/12/2023  . INFLUENZA VACCINE  Completed  . Hepatitis C Screening  Completed      Plan:      Follow with Dr.Blyth as scheduled.  During the course of the visit the patient was educated and counseled about the following appropriate screening and preventive services:   Vaccines to include Pneumoccal, Influenza, Hepatitis B, Td, Zostavax, HCV  Cardiovascular Disease  Colorectal cancer screening  Diabetes screening  Prostate Cancer Screening  Glaucoma screening  Nutrition counseling   Patient Instructions (the written plan) was given to the patient.    Shela Nevin, [place]  10/15/2016   RN AWV note reviewed. Agree with documention and plan.  Penni Homans, MD

## 2016-10-15 ENCOUNTER — Encounter: Payer: Self-pay | Admitting: Family Medicine

## 2016-10-15 ENCOUNTER — Ambulatory Visit (INDEPENDENT_AMBULATORY_CARE_PROVIDER_SITE_OTHER): Payer: Medicare Other | Admitting: Family Medicine

## 2016-10-15 VITALS — BP 120/76 | HR 70 | Ht 68.0 in | Wt 180.8 lb

## 2016-10-15 DIAGNOSIS — Z23 Encounter for immunization: Secondary | ICD-10-CM | POA: Diagnosis not present

## 2016-10-15 DIAGNOSIS — N401 Enlarged prostate with lower urinary tract symptoms: Secondary | ICD-10-CM

## 2016-10-15 DIAGNOSIS — I82531 Chronic embolism and thrombosis of right popliteal vein: Secondary | ICD-10-CM

## 2016-10-15 DIAGNOSIS — R351 Nocturia: Secondary | ICD-10-CM

## 2016-10-15 DIAGNOSIS — I639 Cerebral infarction, unspecified: Secondary | ICD-10-CM | POA: Diagnosis not present

## 2016-10-15 DIAGNOSIS — E782 Mixed hyperlipidemia: Secondary | ICD-10-CM

## 2016-10-15 DIAGNOSIS — Z0000 Encounter for general adult medical examination without abnormal findings: Secondary | ICD-10-CM

## 2016-10-15 DIAGNOSIS — G450 Vertebro-basilar artery syndrome: Secondary | ICD-10-CM

## 2016-10-15 DIAGNOSIS — R739 Hyperglycemia, unspecified: Secondary | ICD-10-CM | POA: Diagnosis not present

## 2016-10-15 DIAGNOSIS — I10 Essential (primary) hypertension: Secondary | ICD-10-CM

## 2016-10-15 DIAGNOSIS — I63411 Cerebral infarction due to embolism of right middle cerebral artery: Secondary | ICD-10-CM

## 2016-10-15 MED ORDER — AZELASTINE HCL 0.1 % NA SOLN: 2.0000 | Freq: Two times a day (BID) | NASAL | 5 refills | Status: DC

## 2016-10-15 MED ORDER — GABAPENTIN 100 MG PO CAPS: 300.0000 mg | ORAL_CAPSULE | Freq: Three times a day (TID) | ORAL | 3 refills | Status: DC

## 2016-10-15 NOTE — Patient Instructions (Signed)
Capsaicin cream to hand 2 x daily x 1 month to see if the pain Preventive Care 65 Years and Older, Male Preventive care refers to lifestyle choices and visits with your health care provider that can promote health and wellness. What does preventive care include?  A yearly physical exam. This is also called an annual well check.  Dental exams once or twice a year.  Routine eye exams. Ask your health care provider how often you should have your eyes checked.  Personal lifestyle choices, including:  Daily care of your teeth and gums.  Regular physical activity.  Eating a healthy diet.  Avoiding tobacco and drug use.  Limiting alcohol use.  Practicing safe sex.  Taking low-dose aspirin every day.  Taking vitamin and mineral supplements as recommended by your health care provider. What happens during an annual well check? The services and screenings done by your health care provider during your annual well check will depend on your age, overall health, lifestyle risk factors, and family history of disease. Counseling  Your health care provider may ask you questions about your:  Alcohol use.  Tobacco use.  Drug use.  Emotional well-being.  Home and relationship well-being.  Sexual activity.  Eating habits.  History of falls.  Memory and ability to understand (cognition).  Work and work Statistician.  Reproductive health. Screening  You may have the following tests or measurements:  Height, weight, and BMI.  Blood pressure.  Lipid and cholesterol levels. These may be checked every 5 years, or more frequently if you are over 28 years old.  Skin check.  Lung cancer screening. You may have this screening every year starting at age 55 if you have a 30-pack-year history of smoking and currently smoke or have quit within the past 15 years.  Fecal occult blood test (FOBT) of the stool. You may have this test every year starting at age 75.  Flexible  sigmoidoscopy or colonoscopy. You may have a sigmoidoscopy every 5 years or a colonoscopy every 10 years starting at age 61.  Hepatitis C blood test.  Hepatitis B blood test.  Sexually transmitted disease (STD) testing.  Diabetes screening. This is done by checking your blood sugar (glucose) after you have not eaten for a while (fasting). You may have this done every 1-3 years.  Bone density scan. This is done to screen for osteoporosis. You may have this done starting at age 76.  Mammogram. This may be done every 1-2 years. Talk to your health care provider about how often you should have regular mammograms. Talk with your health care provider about your test results, treatment options, and if necessary, the need for more tests. Vaccines  Your health care provider may recommend certain vaccines, such as:  Influenza vaccine. This is recommended every year.  Tetanus, diphtheria, and acellular pertussis (Tdap, Td) vaccine. You may need a Td booster every 10 years.  Varicella vaccine. You may need this if you have not been vaccinated.  Zoster vaccine. You may need this after age 32.  Measles, mumps, and rubella (MMR) vaccine. You may need at least one dose of MMR if you were born in 1957 or later. You may also need a second dose.  Pneumococcal 13-valent conjugate (PCV13) vaccine. One dose is recommended after age 80.  Pneumococcal polysaccharide (PPSV23) vaccine. One dose is recommended after age 51.  Meningococcal vaccine. You may need this if you have certain conditions.  Hepatitis A vaccine. You may need this if you have certain  conditions or if you travel or work in places where you may be exposed to hepatitis A.  Hepatitis B vaccine. You may need this if you have certain conditions or if you travel or work in places where you may be exposed to hepatitis B.  Haemophilus influenzae type b (Hib) vaccine. You may need this if you have certain conditions. Talk to your health care  provider about which screenings and vaccines you need and how often you need them. This information is not intended to replace advice given to you by your health care provider. Make sure you discuss any questions you have with your health care provider. Document Released: 09/30/2015 Document Revised: 05/23/2016 Document Reviewed: 07/05/2015 Elsevier Interactive Patient Education  2017 [employer].

## 2016-10-15 NOTE — Assessment & Plan Note (Signed)
Following with Dr Erlinda Hong

## 2016-10-15 NOTE — Assessment & Plan Note (Signed)
Well controlled, no changes to meds. Encouraged heart healthy diet such as the DASH diet and exercise as tolerated.  °

## 2016-10-15 NOTE — Assessment & Plan Note (Signed)
hgba1c acceptable, minimize simple carbs. Increase exercise as tolerated.  

## 2016-10-15 NOTE — Assessment & Plan Note (Signed)
Recent episode of staggering and disequilibrium he is now working with Dr Erlinda Hong. His vascular work up has been negative so far. He is scheduled to have an event monitor placed tomorrow.

## 2016-10-15 NOTE — Assessment & Plan Note (Signed)
Tolerating statin, encouraged heart healthy diet, avoid trans fats, minimize simple carbs and saturated fats. Increase exercise as tolerated 

## 2016-10-16 ENCOUNTER — Encounter (HOSPITAL_COMMUNITY)
Admission: RE | Disposition: A | Discharge status: Home / Self Care | Payer: Self-pay | Source: Ambulatory Visit | Attending: Internal Medicine

## 2016-10-16 ENCOUNTER — Encounter (HOSPITAL_COMMUNITY): Payer: Self-pay | Admitting: Internal Medicine

## 2016-10-16 ENCOUNTER — Ambulatory Visit (HOSPITAL_COMMUNITY)
Admission: RE | Admit: 2016-10-16 | Discharge: 2016-10-16 | Disposition: A | Discharge status: Home / Self Care | Payer: Medicare Other | Source: Ambulatory Visit | Attending: Internal Medicine | Admitting: Internal Medicine

## 2016-10-16 DIAGNOSIS — G450 Vertebro-basilar artery syndrome: Secondary | ICD-10-CM | POA: Diagnosis not present

## 2016-10-16 DIAGNOSIS — K589 Irritable bowel syndrome without diarrhea: Secondary | ICD-10-CM | POA: Insufficient documentation

## 2016-10-16 DIAGNOSIS — Z823 Family history of stroke: Secondary | ICD-10-CM | POA: Diagnosis not present

## 2016-10-16 DIAGNOSIS — Z86718 Personal history of other venous thrombosis and embolism: Secondary | ICD-10-CM | POA: Insufficient documentation

## 2016-10-16 DIAGNOSIS — Z8249 Family history of ischemic heart disease and other diseases of the circulatory system: Secondary | ICD-10-CM | POA: Diagnosis not present

## 2016-10-16 DIAGNOSIS — Z87442 Personal history of urinary calculi: Secondary | ICD-10-CM | POA: Diagnosis not present

## 2016-10-16 DIAGNOSIS — Z7902 Long term (current) use of antithrombotics/antiplatelets: Secondary | ICD-10-CM | POA: Insufficient documentation

## 2016-10-16 DIAGNOSIS — M1990 Unspecified osteoarthritis, unspecified site: Secondary | ICD-10-CM | POA: Diagnosis not present

## 2016-10-16 DIAGNOSIS — F329 Major depressive disorder, single episode, unspecified: Secondary | ICD-10-CM | POA: Diagnosis not present

## 2016-10-16 DIAGNOSIS — Z7951 Long term (current) use of inhaled steroids: Secondary | ICD-10-CM | POA: Diagnosis not present

## 2016-10-16 DIAGNOSIS — Z8042 Family history of malignant neoplasm of prostate: Secondary | ICD-10-CM | POA: Insufficient documentation

## 2016-10-16 DIAGNOSIS — E785 Hyperlipidemia, unspecified: Secondary | ICD-10-CM | POA: Diagnosis not present

## 2016-10-16 DIAGNOSIS — J45909 Unspecified asthma, uncomplicated: Secondary | ICD-10-CM | POA: Diagnosis not present

## 2016-10-16 DIAGNOSIS — I639 Cerebral infarction, unspecified: Secondary | ICD-10-CM | POA: Diagnosis not present

## 2016-10-16 DIAGNOSIS — K219 Gastro-esophageal reflux disease without esophagitis: Secondary | ICD-10-CM | POA: Diagnosis not present

## 2016-10-16 DIAGNOSIS — I638 Other cerebral infarction: Secondary | ICD-10-CM | POA: Diagnosis not present

## 2016-10-16 DIAGNOSIS — I10 Essential (primary) hypertension: Secondary | ICD-10-CM | POA: Diagnosis not present

## 2016-10-16 DIAGNOSIS — Z833 Family history of diabetes mellitus: Secondary | ICD-10-CM | POA: Diagnosis not present

## 2016-10-16 HISTORY — PX: EP IMPLANTABLE DEVICE: SHX172B

## 2016-10-16 SURGERY — LOOP RECORDER INSERTION
Anesthesia: LOCAL

## 2016-10-16 MED ORDER — LIDOCAINE HCL (PF) 1 % IJ SOLN
INTRAMUSCULAR | Status: AC
  Filled 2016-10-16: qty 30

## 2016-10-16 MED ORDER — LIDOCAINE HCL (PF) 1 % IJ SOLN
INTRAMUSCULAR | Status: DC | PRN
  Administered 2016-10-16: 20 mL

## 2016-10-16 SURGICAL SUPPLY — 2 items
LOOP REVEAL LINQSYS (Prosthesis & Implant Heart) ×2 IMPLANT
PACK LOOP INSERTION (CUSTOM PROCEDURE TRAY) ×2 IMPLANT

## 2016-10-16 NOTE — Interval H&P Note (Signed)
History and Physical Interval Note:  10/16/2016 7:18 AM  Paul Drake  has presented today for surgery, with the diagnosis of stroke  The various methods of treatment have been discussed with the patient and family. After consideration of risks, benefits and other options for treatment, the patient has consented to  Procedure(s): Loop Recorder Insertion (N/A) as a surgical intervention .  The patient's history has been reviewed, patient examined, no change in status, stable for surgery.  I have reviewed the patient's chart and labs.  Questions were answered to the patient's satisfaction.     Thompson Grayer

## 2016-10-16 NOTE — H&P (Signed)
ELECTROPHYSIOLOGY CONSULT NOTE  Patient ID: Paul Drake MRN: [id number], DOB/AGE: 19-Feb-1946   Admit date: 10/16/2016 Date of Consult: 10/16/2016  Primary Physician: Penni Homans, MD Primary Cardiologist: new to [facility] Reason for Consultation: Cryptogenic stroke; recommendations regarding Implantable Loop Recorder  History of Present Illness Paul Drake is a 71 y.o. male with a past medical history significant for recurrent strokes felt to be embolic 2/2 unknown source. He has previously also had DVT and was on Xarelto for several months.  He has been evaluated by neurology with no cause for stroke identified and is referred for consideration of implantable loop recorder.    TEE 09/2014 demonstrated EF 55-60%, no PFO.  The patient denies chest pain, shortness of breath, dizziness, palpitations, or syncope.   EP has been asked to evaluate for placement of an implantable loop recorder to monitor for atrial fibrillation.    Past Medical History:  Diagnosis Date  . Allergic state 12/29/2013  . Arthritis   . Barrett's esophagus   . Chicken pox   . CVA (cerebral infarction)   . Depression   . GERD (gastroesophageal reflux disease)   . HTN (hypertension), benign 04/01/2015  . Hyperglycemia 01/06/2015  . Hyperlipemia   . Hypertension   . IBS (irritable bowel syndrome)   . Kidney stones   . Measles as a child  . Medicare annual wellness visit, subsequent 06/26/2015  . Mumps as a child  . Otitis externa 12/10/2013  . Seasonal allergies    some asthma  . Stroke ([place])   . Unspecified asthma(493.90) 12/29/2013     Surgical History:  Past Surgical History:  Procedure Laterality Date  . COLONOSCOPY    . LITHOTRIPSY     multiple times  . TEE WITHOUT CARDIOVERSION N/A 09/22/2014   Procedure: TRANSESOPHAGEAL ECHOCARDIOGRAM (TEE);  Surgeon: Josue Hector, MD;  Location: [facility] ENDOSCOPY;  Service: Cardiovascular;  Laterality: N/A;  . WISDOM TOOTH EXTRACTION       Prescriptions  Prior to Admission  Medication Sig Dispense Refill Last Dose  . azelastine (ASTELIN) 0.1 % nasal spray Place 2 sprays into both nostrils 2 (two) times daily. Use in each nostril as directed 30 mL 5 10/15/2016 at Unknown time  . cetirizine (ZYRTEC) 10 MG tablet Take 10 mg by mouth.   10/15/2016 at Unknown time  . citalopram (CELEXA) 20 MG tablet TAKE 1 TABLET(20 MG) BY MOUTH DAILY 30 tablet 0 10/15/2016 at Unknown time  . clopidogrel (PLAVIX) 75 MG tablet TAKE 1 TABLET(75 MG) BY MOUTH DAILY 90 tablet 2 10/15/2016 at Unknown time  . fluticasone (FLONASE) 50 MCG/ACT nasal spray SHAKE WELL AND USE 2 SPRAYS IN EACH NOSTRIL DAILY 16 g 6 10/15/2016 at Unknown time  . gabapentin (NEURONTIN) 100 MG capsule Take 3 capsules (300 mg total) by mouth 3 (three) times daily. 270 capsule 3 10/16/2016 at Unknown time  . losartan (COZAAR) 100 MG tablet TAKE 1 TABLET(100 MG) BY MOUTH DAILY 90 tablet 0 10/15/2016 at Unknown time  . Multiple Vitamins-Minerals (MULTIVITAMINS THER. W/MINERALS) TABS tablet Take 1 tablet by mouth daily.   10/15/2016 at Unknown time  . Omega-3 Fatty Acids (OMEGA-3 PO) Take 1 capsule by mouth daily.   10/15/2016 at Unknown time  . omeprazole (PRILOSEC) 40 MG capsule TAKE 1 CAPSULE(40 MG) BY MOUTH TWICE DAILY 180 capsule 0 10/15/2016 at Unknown time  . OVER THE COUNTER MEDICATION Take 1 tablet by mouth 2 (two) times daily. methylation complete supplement   10/15/2016 at Unknown time  .  Simethicone (GAS-X PO) Take 2 tablets by mouth daily as needed (gas).   Taking  . simvastatin (ZOCOR) 20 MG tablet TAKE 1 TABLET(20 MG) BY MOUTH DAILY 90 tablet 2 10/15/2016 at Unknown time  . albuterol (PROVENTIL HFA;VENTOLIN HFA) 108 (90 BASE) MCG/ACT inhaler Inhale 2 puffs into the lungs every 6 (six) hours as needed for wheezing or shortness of breath. 1 Inhaler 3 Unknown at Unknown time  . oxyCODONE-acetaminophen (PERCOCET/ROXICET) 5-325 MG tablet Take 1 tablet daily as needed for severe pain  0 Unknown at Unknown time      Inpatient Medications:   Allergies:  Allergies  Allergen Reactions  . Pollen Extract Other (See Comments)    Runny nose,itchy eyes    Social History   Social History  . Marital status: Married    Spouse name: Webb Silversmith  . Number of children: 2  . Years of education: college   Occupational History  .      retired   Social History Main Topics  . Smoking status: Never Smoker  . Smokeless tobacco: Never Used  . Alcohol use 0.6 oz/week    1 Shots of liquor per week     Comment: 1-2 occasioinal  . Drug use: No  . 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 Not on file   Social History Narrative   Patient consumes 2 cups of caffeine daily     Family History  Problem Relation Age of Onset  . Hypertension Mother   . Hyperlipidemia Mother   . Fibromyalgia Mother   . Arthritis Mother     rheumatoid  . Diabetes Sister     type 2  . Hyperlipidemia Brother   . Hypertension Brother   . Ulcers Father 36    Bleeding Ulcers  . Kidney Stones Daughter   . Asthma Daughter   . Healthy Son   . Cancer Maternal Grandfather     skin ?  . Stroke Maternal Aunt   . Cancer Maternal Uncle     prostate      Review of Systems: All other systems reviewed and are otherwise negative except as noted above.  Physical Exam: Vitals:   10/16/16 0656  BP: 117/79  Pulse: 73  Resp: 20  Temp: 97.9 F (36.6 C)  TempSrc: Oral  SpO2: 97%  Weight: 177 lb (80.3 kg)  Height: 5\' 8"  (1.727 m)    GEN- The patient is well appearing, alert and oriented x 3 today.   Head- normocephalic, atraumatic Eyes-  Sclera clear, conjunctiva pink Ears- hearing intact Oropharynx- clear Neck- supple Lungs- Clear to ausculation bilaterally, normal work of breathing Heart- Regular rate and rhythm, no murmurs, rubs or gallops  GI- soft, NT, ND, + BS Extremities- no clubbing, cyanosis, or edema MS- no significant  deformity or atrophy Skin- no rash or lesion Psych- euthymic mood, full affect   Labs:   Lab Results  Component Value Date   WBC 5.6 09/14/2016   HGB 13.3 09/14/2016   HCT 39.9 09/14/2016   MCV 99.3 09/14/2016   PLT 167 09/14/2016    Radiology/Studies: Ct Angio Head W Or Wo Contrast Result Date: 10/01/2016 CLINICAL DATA:  Vertebrobasilar artery syndrome. EXAM: CT ANGIOGRAPHY HEAD AND NECK TECHNIQUE: Multidetector CT imaging of the head and neck was performed using the standard protocol during bolus administration of intravenous contrast. Multiplanar CT image reconstructions and MIPs were obtained to evaluate the  vascular anatomy. Carotid stenosis measurements (when applicable) are obtained utilizing NASCET criteria, using the distal internal carotid diameter as the denominator. CONTRAST:  Study 5 cc Isovue 370 intravenous COMPARISON:  Head CT 09/14/2016.  Intracranial MRA 09/21/2014 FINDINGS: CT HEAD FINDINGS Brain: Gliosis in the high right cerebral hemisphere centered in the parietal lobe. No acute or interval infarct. No hemorrhage, hydrocephalus, or mass. Vascular: As below Skull: Normal. Negative for fracture or focal lesion. Sinuses: Negative Orbits: Negative Review of the MIP images confirms the above findings CTA NECK FINDINGS Aortic arch: Mild atheromatous changes. No stenosis or dilatation. Three vessel branching Right carotid system: Mixed calcified and noncalcified plaque at the common carotid bifurcation and along the ICA bulb without stenosis or ulceration. No beading Left carotid system: Minimal atheromatous wall thickening at the ICA bulb. No stenosis or ulceration. No beading. Vertebral arteries: No proximal subclavian or brachiocephalic stenosis. Early branching right vertebral artery from the subclavian which enters the transverse foramen at the level of C4. Both vertebral arteries are smooth and widely patent to the dura. Small atherosclerotic plaque with calcification at the left  distal V1 segment. Skeleton: No acute or aggressive finding. Ordinary degenerative changes in the cervical spine. Other neck: No detected mass or adenopathy. Upper chest: Negative Review of the MIP images confirms the above findings CTA HEAD FINDINGS Anterior circulation: Symmetric carotid arteries. Mild atherosclerotic plaque on the siphons. No major branch occlusion, flow limiting stenosis, aneurysm, or beading. Posterior circulation: Codominant. Early branching left PICA near the dural penetration. Hypoplastic right P1 segment. No major branch occlusion or flow limiting stenosis. Negative for aneurysm. Venous sinuses: Negative Anatomic variants: Noted above Delayed phase: No parenchymal enhancement or mass noted Review of the MIP images confirms the above findings IMPRESSION: 1. No posterior circulation stenosis to suggest vertebrobasilar insufficiency. Mild atherosclerosis noted in the proximal left vertebral artery. 2. Mild carotid circulation atherosclerosis without flow limiting stenosis. 3. Remote right MCA territory infarct. No evidence of interval infarct. Electronically Signed   By: Monte Fantasia M.D.   On: 10/01/2016 16:32   Ct Angio Neck W Or Wo Contrast Result Date: 10/01/2016 CLINICAL DATA:  Vertebrobasilar artery syndrome. EXAM: CT ANGIOGRAPHY HEAD AND NECK TECHNIQUE: Multidetector CT imaging of the head and neck was performed using the standard protocol during bolus administration of intravenous contrast. Multiplanar CT image reconstructions and MIPs were obtained to evaluate the vascular anatomy. Carotid stenosis measurements (when applicable) are obtained utilizing NASCET criteria, using the distal internal carotid diameter as the denominator. CONTRAST:  Study 5 cc Isovue 370 intravenous COMPARISON:  Head CT 09/14/2016.  Intracranial MRA 09/21/2014 FINDINGS: CT HEAD FINDINGS Brain: Gliosis in the high right cerebral hemisphere centered in the parietal lobe. No acute or interval infarct. No  hemorrhage, hydrocephalus, or mass. Vascular: As below Skull: Normal. Negative for fracture or focal lesion. Sinuses: Negative Orbits: Negative Review of the MIP images confirms the above findings CTA NECK FINDINGS Aortic arch: Mild atheromatous changes. No stenosis or dilatation. Three vessel branching Right carotid system: Mixed calcified and noncalcified plaque at the common carotid bifurcation and along the ICA bulb without stenosis or ulceration. No beading Left carotid system: Minimal atheromatous wall thickening at the ICA bulb. No stenosis or ulceration. No beading. Vertebral arteries: No proximal subclavian or brachiocephalic stenosis. Early branching right vertebral artery from the subclavian which enters the transverse foramen at the level of C4. Both vertebral arteries are smooth and widely patent to the dura. Small atherosclerotic plaque with calcification at the left  distal V1 segment. Skeleton: No acute or aggressive finding. Ordinary degenerative changes in the cervical spine. Other neck: No detected mass or adenopathy. Upper chest: Negative Review of the MIP images confirms the above findings CTA HEAD FINDINGS Anterior circulation: Symmetric carotid arteries. Mild atherosclerotic plaque on the siphons. No major branch occlusion, flow limiting stenosis, aneurysm, or beading. Posterior circulation: Codominant. Early branching left PICA near the dural penetration. Hypoplastic right P1 segment. No major branch occlusion or flow limiting stenosis. Negative for aneurysm. Venous sinuses: Negative Anatomic variants: Noted above Delayed phase: No parenchymal enhancement or mass noted Review of the MIP images confirms the above findings IMPRESSION: 1. No posterior circulation stenosis to suggest vertebrobasilar insufficiency. Mild atherosclerosis noted in the proximal left vertebral artery. 2. Mild carotid circulation atherosclerosis without flow limiting stenosis. 3. Remote right MCA territory infarct. No  evidence of interval infarct. Electronically Signed   By: Monte Fantasia M.D.   On: 10/01/2016 16:32    12-lead ECG sinus rhythm All prior EKG's in EPIC reviewed with no documented atrial fibrillation  Assessment and Plan:  1. Cryptogenic stroke The patient has had recurrent cryptogenic strokes. He has undergone evaluation by neurology including TEE.  I spoke at length with the patient about monitoring for afib with an implantable loop recorder.  Risks, benefits, and alteratives to implantable loop recorder were discussed with the patient today.   At this time, the patient is very clear in their decision to proceed with implantable loop recorder.   Wound care was reviewed with the patient.  Please call with questions.   Chanetta Marshall, NP 10/16/2016 7:09 AM  I have seen, examined the patient, and reviewed the above assessment and plan.  On exam, RRR.  Changes to above are made where necessary.  Risks of ILR discussed with patient who wishes to proceed.  Co Sign: Thompson Grayer, MD 10/16/2016 7:17 AM

## 2016-10-17 ENCOUNTER — Ambulatory Visit: Payer: Medicare Other | Admitting: Internal Medicine

## 2016-10-21 NOTE — Assessment & Plan Note (Signed)
No recurrence or concerning symptoms today

## 2016-10-21 NOTE — Assessment & Plan Note (Deleted)
Patient denies any difficulties at home. No trouble with ADLs, depression or falls. See EMR for functional status screen and depression screen. No recent changes to vision or hearing. Is UTD with immunizations. Is UTD with screening. Discussed Advanced Directives. Encouraged heart healthy diet, exercise as tolerated and adequate sleep. See patient's problem list for health risk factors to monitor. See AVS for preventative healthcare recommendation schedule. 

## 2016-10-21 NOTE — Progress Notes (Signed)
Patient ID: Paul Drake, male   DOB: 23-Apr-1946, 71 y.o.   MRN: [id number]   Subjective:    Patient ID: Paul Drake, male    DOB: Apr 23, 1946, 71 y.o.   MRN: [id number]  Chief Complaint  Patient presents with  . Medicare Wellness  . Hypertension  . Hyperlipidemia    Hypertension  This is a chronic problem. The current episode started more than 1 year ago. The problem is unchanged. The problem is controlled. Pertinent negatives include no chest pain, headaches, malaise/fatigue, palpitations or shortness of breath. There are no associated agents to hypertension. The current treatment provides no improvement. There are no compliance problems.  There is no history of angina.  Hyperlipidemia  Pertinent negatives include no chest pain, myalgias or shortness of breath.   He denies any recent hospitalizations, no recent febrile illness. Denies CP/palp/SOB/HA/congestion/fevers/GI or GU c/o. Taking meds as prescribed. He does note some persistent numb sensation in his left hand. No injury shoulder or neck pain Past Medical History:  Diagnosis Date  . Allergic state 12/29/2013  . Arthritis   . Barrett's esophagus   . Chicken pox   . CVA (cerebral infarction)   . Depression   . GERD (gastroesophageal reflux disease)   . HTN (hypertension), benign 04/01/2015  . Hyperglycemia 01/06/2015  . Hyperlipemia   . Hypertension   . IBS (irritable bowel syndrome)   . Kidney stones   . Measles as a child  . Medicare annual wellness visit, subsequent 06/26/2015  . Mumps as a child  . Otitis externa 12/10/2013  . Seasonal allergies    some asthma  . Stroke ([place])   . Unspecified asthma(493.90) 12/29/2013    Past Surgical History:  Procedure Laterality Date  . COLONOSCOPY    . EP IMPLANTABLE DEVICE N/A 10/16/2016   Procedure: Loop Recorder Insertion;  Surgeon: Thompson Grayer, MD;  Location: [facility];  Service: Cardiovascular;  Laterality: N/A;  . LITHOTRIPSY     multiple times  . TEE WITHOUT  CARDIOVERSION N/A 09/22/2014   Procedure: TRANSESOPHAGEAL ECHOCARDIOGRAM (TEE);  Surgeon: Josue Hector, MD;  Location: [facility];  Service: Cardiovascular;  Laterality: N/A;  . WISDOM TOOTH EXTRACTION      Family History  Problem Relation Age of Onset  . Hypertension Mother   . Hyperlipidemia Mother   . Fibromyalgia Mother   . Arthritis Mother     rheumatoid  . Diabetes Sister     type 2  . Hyperlipidemia Brother   . Hypertension Brother   . Ulcers Father 36    Bleeding Ulcers  . Kidney Stones Daughter   . Asthma Daughter   . Healthy Son   . Cancer Maternal Grandfather     skin ?  . Stroke Maternal Aunt   . Cancer Maternal Uncle     prostate    Social History   Social History  . Marital status: Married    Spouse name: Webb Silversmith  . Number of children: 2  . Years of education: college   Occupational History  .      retired   Social History Main Topics  . Smoking status: Never Smoker  . Smokeless tobacco: Never Used  . Alcohol use 0.6 oz/week    1 Shots of liquor per week     Comment: 1-2 occasioinal  . Drug use: No  . 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  Not on file   Social History Narrative   Patient consumes 2 cups of caffeine daily    Outpatient Medications Prior to Visit  Medication Sig Dispense Refill  . albuterol (PROVENTIL HFA;VENTOLIN HFA) 108 (90 BASE) MCG/ACT inhaler Inhale 2 puffs into the lungs every 6 (six) hours as needed for wheezing or shortness of breath. 1 Inhaler 3  . cetirizine (ZYRTEC) 10 MG tablet Take 10 mg by mouth.    . citalopram (CELEXA) 20 MG tablet TAKE 1 TABLET(20 MG) BY MOUTH DAILY 30 tablet 0  . clopidogrel (PLAVIX) 75 MG tablet TAKE 1 TABLET(75 MG) BY MOUTH DAILY 90 tablet 2  . fluticasone (FLONASE) 50 MCG/ACT nasal spray SHAKE WELL AND USE 2 SPRAYS IN EACH NOSTRIL DAILY 16 g 6  . losartan (COZAAR) 100 MG tablet TAKE 1 TABLET(100 MG) BY  MOUTH DAILY 90 tablet 0  . Multiple Vitamins-Minerals (MULTIVITAMINS THER. W/MINERALS) TABS tablet Take 1 tablet by mouth daily.    . Omega-3 Fatty Acids (OMEGA-3 PO) Take 1 capsule by mouth daily.    Marland Kitchen omeprazole (PRILOSEC) 40 MG capsule TAKE 1 CAPSULE(40 MG) BY MOUTH TWICE DAILY 180 capsule 0  . OVER THE COUNTER MEDICATION Take 1 tablet by mouth 2 (two) times daily. methylation complete supplement    . oxyCODONE-acetaminophen (PERCOCET/ROXICET) 5-325 MG tablet Take 1 tablet daily as needed for severe pain  0  . Simethicone (GAS-X PO) Take 2 tablets by mouth daily as needed (gas).    . simvastatin (ZOCOR) 20 MG tablet TAKE 1 TABLET(20 MG) BY MOUTH DAILY 90 tablet 2  . gabapentin (NEURONTIN) 100 MG capsule Take 2 capsules (200 mg total) by mouth 3 (three) times daily. 180 capsule 3  . Naphazoline-Pheniramine (VISINE-A OP) Apply 1 drop to eye daily as needed (allergies).     No facility-administered medications prior to visit.     Allergies  Allergen Reactions  . Pollen Extract Other (See Comments)    Runny nose,itchy eyes    Review of Systems  Constitutional: Negative for chills, fever and malaise/fatigue.  HENT: Negative for congestion and hearing loss.   Eyes: Negative for discharge.  Respiratory: Negative for cough, sputum production and shortness of breath.   Cardiovascular: Negative for chest pain, palpitations and leg swelling.  Gastrointestinal: Negative for abdominal pain, blood in stool, constipation, diarrhea, heartburn, nausea and vomiting.  Genitourinary: Negative for dysuria, frequency, hematuria and urgency.  Musculoskeletal: Negative for back pain, falls and myalgias.  Skin: Negative for rash.  Neurological: Positive for sensory change. Negative for dizziness, loss of consciousness, weakness and headaches.  Endo/Heme/Allergies: Negative for environmental allergies. Does not bruise/bleed easily.  Psychiatric/Behavioral: Negative for depression and suicidal ideas. The  patient is not nervous/anxious and does not have insomnia.        Objective:    Physical Exam  Constitutional: He is oriented to person, place, and time. He appears well-developed and well-nourished. No distress.  HENT:  Head: Normocephalic and atraumatic.  Eyes: Conjunctivae are normal.  Neck: Neck supple. No thyromegaly present.  Cardiovascular: Normal rate, regular rhythm and normal heart sounds.   No murmur heard. Pulmonary/Chest: Effort normal and breath sounds normal. No respiratory distress. He has no wheezes.  Abdominal: Soft. Bowel sounds are normal. He exhibits no mass. There is no tenderness.  Musculoskeletal: He exhibits no edema.  Lymphadenopathy:    He has no cervical adenopathy.  Neurological: He is alert and oriented to person, place, and time.  Skin: Skin is warm and dry.  Psychiatric: He  has a normal mood and affect. His behavior is normal.    BP 120/76 (BP Location: Right Arm, Patient Position: Sitting, Cuff Size: Normal)   Pulse 70   Ht 5\' 8"  (1.727 m)   Wt 180 lb 12.8 oz (82 kg)   SpO2 98%   BMI 27.49 kg/m  Wt Readings from Last 3 Encounters:  10/16/16 177 lb (80.3 kg)  10/15/16 180 lb 12.8 oz (82 kg)  09/26/16 182 lb 6.4 oz (82.7 kg)     Lab Results  Component Value Date   WBC 5.6 09/14/2016   HGB 13.3 09/14/2016   HCT 39.9 09/14/2016   PLT 167 09/14/2016   GLUCOSE 198 (H) 09/14/2016   CHOL 158 10/20/2015   TRIG 90.0 10/20/2015   HDL 54.30 10/20/2015   LDLDIRECT 76.0 12/31/2014   LDLCALC 85 10/20/2015   ALT 16 02/03/2016   AST 22 02/03/2016   NA 136 09/14/2016   K 3.7 09/14/2016   CL 101 09/14/2016   CREATININE 1.11 09/14/2016   BUN 19 09/14/2016   CO2 28 09/14/2016   TSH 2.55 10/20/2015   INR 0.98 09/20/2014   HGBA1C 5.4 10/20/2015    Lab Results  Component Value Date   TSH 2.55 10/20/2015   Lab Results  Component Value Date   WBC 5.6 09/14/2016   HGB 13.3 09/14/2016   HCT 39.9 09/14/2016   MCV 99.3 09/14/2016   PLT 167  09/14/2016   Lab Results  Component Value Date   NA 136 09/14/2016   K 3.7 09/14/2016   CO2 28 09/14/2016   GLUCOSE 198 (H) 09/14/2016   BUN 19 09/14/2016   CREATININE 1.11 09/14/2016   BILITOT 0.7 02/03/2016   ALKPHOS 69 02/03/2016   AST 22 02/03/2016   ALT 16 02/03/2016   PROT 6.7 02/03/2016   ALBUMIN 4.3 02/03/2016   CALCIUM 9.3 09/14/2016   ANIONGAP 7 09/14/2016   GFR 70.00 02/03/2016   Lab Results  Component Value Date   CHOL 158 10/20/2015   Lab Results  Component Value Date   HDL 54.30 10/20/2015   Lab Results  Component Value Date   LDLCALC 85 10/20/2015   Lab Results  Component Value Date   TRIG 90.0 10/20/2015   Lab Results  Component Value Date   CHOLHDL 3 10/20/2015   Lab Results  Component Value Date   HGBA1C 5.4 10/20/2015       Assessment & Plan:   Problem List Items Addressed This Visit    BPH (benign prostatic hyperplasia) - Primary   Stroke [facility])    Following with Dr Erlinda Hong      DVT, lower extremity ([place])    No recurrence or concerning symptoms today      Hyperglycemia    hgba1c acceptable, minimize simple carbs. Increase exercise as tolerated.       Relevant Orders   Hemoglobin A1c   Comprehensive metabolic panel   CBC   TSH   HTN (hypertension), benign    Well controlled, no changes to meds. Encouraged heart healthy diet such as the DASH diet and exercise as tolerated.       Relevant Orders   Hemoglobin A1c   Comprehensive metabolic panel   CBC   TSH   Medicare annual wellness visit, subsequent   RESOLVED: Essential hypertension    Well controlled, no changes to meds. Encouraged heart healthy diet such as the DASH diet and exercise as tolerated.       HLD (hyperlipidemia)  Tolerating statin, encouraged heart healthy diet, avoid trans fats, minimize simple carbs and saturated fats. Increase exercise as tolerated      TIA (transient ischemic attack)    Recent episode of staggering and disequilibrium he is now  working with Dr Erlinda Hong. His vascular work up has been negative so far. He is scheduled to have an event monitor placed tomorrow.         I have discontinued Mr. Yohannes's Naphazoline-Pheniramine (VISINE-A OP). I have also changed his gabapentin. Additionally, I am having him start on azelastine. Lastly, I am having him maintain his multivitamins ther. w/minerals, albuterol, citalopram, omeprazole, fluticasone, clopidogrel, simvastatin, losartan, cetirizine, oxyCODONE-acetaminophen, Omega-3 Fatty Acids (OMEGA-3 PO), OVER THE COUNTER MEDICATION, and Simethicone (GAS-X PO).  Meds ordered this encounter  Medications  . gabapentin (NEURONTIN) 100 MG capsule    Sig: Take 3 capsules (300 mg total) by mouth 3 (three) times daily.    Dispense:  270 capsule    Refill:  3  . azelastine (ASTELIN) 0.1 % nasal spray    Sig: Place 2 sprays into both nostrils 2 (two) times daily. Use in each nostril as directed    Dispense:  30 mL    Refill:  5     Drayce Tawil, MD

## 2016-10-26 ENCOUNTER — Other Ambulatory Visit: Payer: Self-pay | Admitting: Family Medicine

## 2016-10-31 ENCOUNTER — Ambulatory Visit (INDEPENDENT_AMBULATORY_CARE_PROVIDER_SITE_OTHER): Payer: Medicare Other | Admitting: *Deleted

## 2016-10-31 DIAGNOSIS — I639 Cerebral infarction, unspecified: Secondary | ICD-10-CM

## 2016-10-31 DIAGNOSIS — Z95818 Presence of other cardiac implants and grafts: Secondary | ICD-10-CM

## 2016-10-31 LAB — CUP PACEART INCLINIC DEVICE CHECK
Date Time Interrogation Session: 20180214115405
Implantable Pulse Generator Implant Date: 20180130

## 2016-10-31 NOTE — Progress Notes (Signed)
Loop wound check in clinic. Steris-strips removed. Incision edges approximated without redness, swelling, drainage. Wound well-healed. Patient educated on wound care and Carelink Monitor. Device implanted infra-mammary. Battery status: good. R-waves 0.44mV. 0 symptom episodes, 0 tachy episodes, 0 pause episodes, 0 brady episodes. 0 AF episodes (0% burden). Monthly summary reports and ROV with JA PRN.

## 2016-11-07 DIAGNOSIS — R350 Frequency of micturition: Secondary | ICD-10-CM | POA: Diagnosis not present

## 2016-11-15 ENCOUNTER — Ambulatory Visit (INDEPENDENT_AMBULATORY_CARE_PROVIDER_SITE_OTHER): Payer: Medicare Other | Admitting: *Deleted

## 2016-11-15 DIAGNOSIS — I639 Cerebral infarction, unspecified: Secondary | ICD-10-CM | POA: Diagnosis not present

## 2016-11-15 NOTE — Progress Notes (Signed)
Carelink Summary Report / Loop Recorder 

## 2016-11-30 LAB — CUP PACEART REMOTE DEVICE CHECK
Implantable Pulse Generator Implant Date: 20180130
MDC IDC SESS DTM: 20180301123601

## 2016-12-14 ENCOUNTER — Other Ambulatory Visit: Payer: Self-pay | Admitting: Family Medicine

## 2016-12-17 ENCOUNTER — Ambulatory Visit (INDEPENDENT_AMBULATORY_CARE_PROVIDER_SITE_OTHER): Payer: Medicare Other | Admitting: *Deleted

## 2016-12-17 DIAGNOSIS — I639 Cerebral infarction, unspecified: Secondary | ICD-10-CM

## 2016-12-17 NOTE — Progress Notes (Signed)
Carelink Summary Report / Loop Recorder 

## 2016-12-19 LAB — CUP PACEART REMOTE DEVICE CHECK
Implantable Pulse Generator Implant Date: 20180130
MDC IDC SESS DTM: 20180331123531

## 2016-12-31 ENCOUNTER — Telehealth: Payer: Self-pay | Admitting: *Deleted

## 2016-12-31 ENCOUNTER — Ambulatory Visit: Payer: Medicare Other | Admitting: Neurology

## 2016-12-31 NOTE — Telephone Encounter (Signed)
Called pt. Advised office closed, power out due to weather. Dr Erlinda Hong nurse will call back to r/s his apt. Apologized for any inconvenience . He verbalized understanding and appreciation for call.

## 2017-01-01 NOTE — Telephone Encounter (Signed)
Rn call patient about coming in today this pm for appt. P stated he was busy today and cannot come in. PT r/s for Jan 16, 2017 with Dr.Xu at 1200pm because of office being closed.

## 2017-01-01 NOTE — Telephone Encounter (Signed)
If pt clinically stable, he can be seen by me next available in July or August. Thanks.   Rosalin Hawking, MD PhD Stroke Neurology 01/01/2017 1:31 PM

## 2017-01-14 ENCOUNTER — Ambulatory Visit (INDEPENDENT_AMBULATORY_CARE_PROVIDER_SITE_OTHER): Payer: Medicare Other | Admitting: *Deleted

## 2017-01-14 DIAGNOSIS — I639 Cerebral infarction, unspecified: Secondary | ICD-10-CM

## 2017-01-14 NOTE — Progress Notes (Signed)
Carelink Summary Report / Loop Recorder 

## 2017-01-16 ENCOUNTER — Ambulatory Visit (INDEPENDENT_AMBULATORY_CARE_PROVIDER_SITE_OTHER): Payer: Medicare Other | Admitting: Neurology

## 2017-01-16 ENCOUNTER — Encounter (INDEPENDENT_AMBULATORY_CARE_PROVIDER_SITE_OTHER): Payer: Self-pay

## 2017-01-16 ENCOUNTER — Encounter: Payer: Self-pay | Admitting: Neurology

## 2017-01-16 VITALS — BP 119/64 | HR 57 | Wt 178.0 lb

## 2017-01-16 DIAGNOSIS — I639 Cerebral infarction, unspecified: Secondary | ICD-10-CM

## 2017-01-16 DIAGNOSIS — G450 Vertebro-basilar artery syndrome: Secondary | ICD-10-CM | POA: Diagnosis not present

## 2017-01-16 DIAGNOSIS — I82531 Chronic embolism and thrombosis of right popliteal vein: Secondary | ICD-10-CM | POA: Diagnosis not present

## 2017-01-16 DIAGNOSIS — Q211 Atrial septal defect: Secondary | ICD-10-CM

## 2017-01-16 DIAGNOSIS — Q2112 Patent foramen ovale: Secondary | ICD-10-CM

## 2017-01-16 DIAGNOSIS — I63411 Cerebral infarction due to embolism of right middle cerebral artery: Secondary | ICD-10-CM | POA: Diagnosis not present

## 2017-01-16 NOTE — Progress Notes (Signed)
STROKE NEUROLOGY FOLLOW UP NOTE  NAME: Paul Drake DOB: November 10, 1945  REASON FOR VISIT: stroke follow up HISTORY FROM: pt and chart  Today we had the pleasure of seeing Paul Drake in follow-up at our Neurology Clinic. Pt was accompanied by no one.   History Summary Paul Drake is a LH 71 y.o. male with a history of right MCA infarct in 2008 with residue LLE minimal weakness was admitted on 09/20/14 for acute onset left sided weakness and difficulty with speech.he received tPA. His acute right frontal stroke is adjacent to your previous stroke in 2008 shown in MRI. Stroke work up showed PFO with valsalva on TEE and positive right LE DVT. He was put on Xarelto 15mg  twice a day for 21 days and then 20mg  daily for 5 months. He mentioned that before this happened he also had some diarrhea and he might have done valsalva maneuvers then.   Patient's previous stroke was in 2008 and the patient has a resultant LLE numbness and weakness. Patient was on ASA but stopped it about a month ago due to gastritis.He denies N/V. However, shortly after the gastritis, he developed kidney stone. He may have a lot of pain which caused him to have valsalva maneuvers.   Follow up 11/24/14 - the patient has been doing well. No recurrent stroke like symptoms. He still has left hand numbness which causing trouble with writing and handling tools. He is on Xarelto daily. He followed up with his PCP closely. His BP today in clinic is 136/82. He still has OT twice a week but will soon be completed. His hypercoagulable work up in hospital was negative.   05/09/15 follow up - the patient has been doing well, no recurrent strokelike symptoms. He is still on Xarelto without side effect. However he continued to suffer from kidney stone, sometimes really painful. Urologist hesitant to do lithotripsy due to bleeding risk. He has been on Xarelto for more than 6 months now. He still reports intermittent left-sided numbness and pain,  especially fingers at left hand. No other complaints, blood pressure 125/78. TCD confirmed PFO and no MES on 30 min monitoring.  08/15/15 follow up - pt has been doing well without stroke like symptoms on plavix. His kidney stone was taken out by cystoscopy and did not require surgery. He continues to complain of left hand numbness and tingling, feeling swallow. However, he only takes once a day dose of neurontin and feels drowsiness with neurontin. BP 123/75 in clinic today.    02/14/16 follow up - pt has been doing well from stroke standpoint. Still has left hand numbness especially after active exercise with golf practice. Had kidney stone removal and abdominal pain much improved. However, recently started again belly pain but in different location, more epigastric pain, had CT abdomen which was negative. Considered diverticulitis and put on Abx last week, and seems getting better. BP today was low, 95/62, will decrease losartan dose.   09/26/16 follow up - pt had kidney stone surgery in 07/2016 and off plavix for 5-7 days and currently plavix resumed. However, on 09/14/16, he was walking on the street, had sudden onset staggering, imbalance and kept leaning towards right. He felt dizzy, but not vertigo, no fall, no double vision or speech difficulty. No vomiting but mild nausea. It lasted 15-20 sec and resolved. He managed to get home. Wife called EMS and he was sent to ED. CT head negative. He refused further study and come today for follow up. Denies  leg pain or swelling. BP 104/72  Interval History During the interval time, pt has been doing well, no recurrent stroke like symptoms. LE venous doppler showed no DVT, and CTA head and neck unremarkable. Discussed with Dr. Rayann Heman and loop recorder placed on 10/16/16. So far no afib found. On gabapentin, helps but not able to tolerate higher dose. Would like to continue the low dose. BP 119/64   REVIEW OF SYSTEMS: Full 14 system review of systems performed  and notable only for those listed below and in HPI above, all others are negative:  Constitutional:  fatigue Cardiovascular:  Ear/Nose/Throat:  Skin:  Eyes:   Respiratory:   Gastroitestinal:   Genitourinary: frequency of urination and urgency Hematology/Lymphatic:   Endocrine:  Musculoskeletal:  Allergy/Immunology:   Neurological:  Dizziness and numbness Psychiatric:  Sleep:  The following represents the patient's updated allergies and side effects list: Allergies  Allergen Reactions  . Pollen Extract Other (See Comments)    Runny nose,itchy eyes    The neurologically relevant items on the patient's problem list were reviewed on today's visit.  Neurologic Examination  A problem focused neurological exam (12 or more points of the single system neurologic examination, vital signs counts as 1 point, cranial nerves count for 8 points) was performed.  Blood pressure 119/64, pulse (!) 57, weight 178 lb (80.7 kg).  General - Well nourished, well developed, in no apparent distress.  Ophthalmologic - Sharp disc margins OU.  Cardiovascular - Regular rate and rhythm with no murmur.  Mental Status -  Level of arousal and orientation to time, place, and person were intact. Language including expression, naming, repetition, comprehension, reading, and writing was assessed and found intact. Attention span and concentration were normal. Recent and remote memory were intact. Fund of Knowledge was assessed and was intact.  Cranial Nerves II - XII - II - Visual field intact OU. III, IV, VI - Extraocular movements intact. V - Facial sensation intact bilaterally. VII - Facial movement intact bilaterally. VIII - Hearing & vestibular intact bilaterally. X - Palate elevates symmetrically. XI - Chin turning & shoulder shrug intact bilaterally. XII - Tongue protrusion intact.  Motor Strength - The patient's strength was normal in all extremities and pronator drift was absent.  Bulk was  normal and fasciculations were absent.   Motor Tone - Muscle tone was assessed at the neck and appendages and was normal.  Reflexes - The patient's reflexes were normal in all extremities and he had no pathological reflexes.  Sensory - Light touch, temperature/pinprick decreased on the left ulnar hand and all fingertips and Romberg testing were assessed and were normal.    Coordination - The patient had normal movements in the hands and feet with no ataxia or dysmetria.  Tremor was absent.  Gait and Station - The patient's transfers, posture, gait, station, and turns were observed as normal.  Data reviewed: I personally reviewed the images and agree with the radiology interpretations.  Ct Head (brain) Wo Contrast  09/20/2014 IMPRESSION: 1. No acute intracranial pathology seen on CT. 2. Chronic infarct at the high right parietal region, with associated encephalomalacia. 3. Mild cortical volume loss and scattered small vessel ischemic microangiopathy.   Carotid Doppler Bilateral: 1-39% ICA stenosis. Vertebral artery flow is antegrade.  MRI HEAD Moderate size remote infarct posterior right frontal -parietal lobe with encephalomalacia. Small to moderate size acute infarct along the anterior margin of this remote infarct involving the posterior right frontal lobe extending to the posterior right operculum region.  No intracranial hemorrhage.  MRA HEAD Decrease number of visualized right middle cerebral artery branch vessels consistent with patient's remote and acute infarct. No significant stenosis proximal to the branch vessel level. Ectatic vertebral arteries and basilar artery with slight irregularity of the distal left vertebral artery without significant stenosis. Nonvisualized right posterior inferior cerebellar artery. Mild posterior cerebral artery branch vessel irregularity and narrowing more notable on the right.  EKG normal EKG, normal sinus rhythm. For complete  results please see formal report.  LE venous doppler - The right lower extremity is positive for deep vein thrombosis involving the right gastrocnemius, posterior tibial, and peroneal veins. There is no evidence of left lower extremity deep vein thrombosis.  TEE -  1) PFO by bubble study not visualized Positive bubble study only with valsalva 2) Normal EF 60% 3) No LAA thrombus 4) Trivial MR 5) Mild AR 6) No aortic debris 7) Mild TR 8) Normal RV  TCD bubble study positive with grade I at rest and grade III with valsalva.  TCD emboli detection - negative for MES.    LE venous doppler 09/27/16 - No evidence of deep vein or superficial thrombosis involving the   right lower extremity and left lower extremity. - No evidence of Baker&'s cyst on the right or left.  CTA head and neck 10/01/16 1. No posterior circulation stenosis to suggest vertebrobasilar insufficiency. Mild atherosclerosis noted in the proximal left vertebral artery. 2. Mild carotid circulation atherosclerosis without flow limiting stenosis. 3. Remote right MCA territory infarct. No evidence of interval infarct.  Component     Latest Ref Rng & Units 04/01/2015 10/20/2015  Cholesterol     0 - 200 mg/dL 158 158  Triglycerides     0.0 - 149.0 mg/dL 167.0 (H) 90.0  HDL Cholesterol     >39.00 mg/dL 55.10 54.30  VLDL     0.0 - 40.0 mg/dL 33.4 18.0  LDL (calc)     0 - 99 mg/dL 70 85  Total CHOL/HDL Ratio      3 3  NonHDL      102.90 103.44  TSH     0.35 - 4.50 uIU/mL  2.55  Hemoglobin A1C     4.6 - 6.5 %  5.4    Assessment: As you may recall, he is a 71 y.o. Caucasian male with PMH of right MCA CVA in 2008 was admitted on1/4/16 for again right MCA small stroke. The first stroke more at right motor strip and the last stroke more at sensory strip. Stroke w/u showed positive DVT and a likely small PFO with RLS only on valsalva maneuver by TEE. However, TCD bubble study showed grade 1 RLS at rest and agrees 3 RLS with  Valsalva maneuver. TCD MES negative. For the two strokes, it is likely pt had some valsalva maneuver prior to the strokes. He was on Xarelto for more than 6 months and due to kidney stone surgery with bleeding risk, he optioned to switch Xarelto to plavix for stroke prevention. Low-dose Neurontin for left hand numbness/pain. Had TIA symptoms with leaning towards right and imbalance gait on 09/14/16. CT negative. LE venous doppler and CTA head and neck all negative. Loop recorder placed so far no afib.   Plan:  - continue plavix and zocor for stroke prevention for now - continue neurontin for the numbness and pain. - Check BP at home and record. BP goal 120-130 - recommend melatonin to try for REM sleep behavior - continue monitoring loop recorder -  Follow up with your primary care physician for stroke risk factor modification. Recommend maintain blood pressure goal 120-130/80, diabetes with hemoglobin A1c goal below 6.5% and lipids with LDL cholesterol goal below 70 mg/dL.  - follow up in 6 months with me.  I spent more than 25 minutes of face to face time with the patient. Greater than 50% of time was spent in counseling and coordination of care. We reviewed images, discussed loop recorder monitoring, continue gabapentin and BP monitoring.   No orders of the defined types were placed in this encounter.   No orders of the defined types were placed in this encounter.   Patient Instructions  - continue plavix and zocor for stroke prevention for now - continue neurontin for the numbness and pain. - Check BP at home and record. BP goal 120-130 - recommend melatonin to try for REM sleep behavior - continue monitoring loop recorder - Follow up with your primary care physician for stroke risk factor modification. Recommend maintain blood pressure goal 120-130/80, diabetes with hemoglobin A1c goal below 6.5% and lipids with LDL cholesterol goal below 70 mg/dL.  - follow up in 6 months with  me.   Rosalin Hawking, MD PhD [facility] [address] [place], [place] [address] [phone]

## 2017-01-16 NOTE — Patient Instructions (Signed)
-   continue plavix and zocor for stroke prevention for now - continue neurontin for the numbness and pain. - Check BP at home and record. BP goal 120-130 - recommend melatonin to try for REM sleep behavior - continue monitoring loop recorder - Follow up with your primary care physician for stroke risk factor modification. Recommend maintain blood pressure goal 120-130/80, diabetes with hemoglobin A1c goal below 6.5% and lipids with LDL cholesterol goal below 70 mg/dL.  - follow up in 6 months with me.

## 2017-01-17 DIAGNOSIS — I82531 Chronic embolism and thrombosis of right popliteal vein: Secondary | ICD-10-CM | POA: Insufficient documentation

## 2017-01-21 DIAGNOSIS — N138 Other obstructive and reflux uropathy: Secondary | ICD-10-CM | POA: Diagnosis not present

## 2017-01-21 DIAGNOSIS — R350 Frequency of micturition: Secondary | ICD-10-CM | POA: Diagnosis not present

## 2017-01-21 DIAGNOSIS — N401 Enlarged prostate with lower urinary tract symptoms: Secondary | ICD-10-CM | POA: Diagnosis not present

## 2017-01-21 DIAGNOSIS — N201 Calculus of ureter: Secondary | ICD-10-CM | POA: Diagnosis not present

## 2017-01-26 LAB — CUP PACEART REMOTE DEVICE CHECK
Implantable Pulse Generator Implant Date: 20180130
MDC IDC SESS DTM: 20180430133633

## 2017-01-26 NOTE — Progress Notes (Signed)
Carelink summary report received. Battery status OK. Normal device function. No new symptom episodes, tachy episodes, brady, or pause episodes. No new AF episodes. Monthly summary reports and ROV/PRN 

## 2017-02-09 ENCOUNTER — Other Ambulatory Visit: Payer: Self-pay | Admitting: Neurology

## 2017-02-09 DIAGNOSIS — I639 Cerebral infarction, unspecified: Secondary | ICD-10-CM

## 2017-02-12 ENCOUNTER — Other Ambulatory Visit: Payer: Self-pay

## 2017-02-12 DIAGNOSIS — I6389 Other cerebral infarction: Secondary | ICD-10-CM

## 2017-02-12 MED ORDER — CLOPIDOGREL BISULFATE 75 MG PO TABS: ORAL_TABLET | ORAL | 2 refills | Status: DC

## 2017-02-13 ENCOUNTER — Ambulatory Visit (INDEPENDENT_AMBULATORY_CARE_PROVIDER_SITE_OTHER): Payer: Medicare Other | Admitting: *Deleted

## 2017-02-13 ENCOUNTER — Ambulatory Visit: Payer: Medicare Other | Admitting: Neurology

## 2017-02-13 DIAGNOSIS — I639 Cerebral infarction, unspecified: Secondary | ICD-10-CM

## 2017-02-14 NOTE — Progress Notes (Signed)
Carelink Summary Report 

## 2017-02-15 LAB — CUP PACEART REMOTE DEVICE CHECK
Date Time Interrogation Session: 20180530133813
MDC IDC PG IMPLANT DT: 20180130

## 2017-02-19 DIAGNOSIS — N400 Enlarged prostate without lower urinary tract symptoms: Secondary | ICD-10-CM | POA: Diagnosis not present

## 2017-02-26 DIAGNOSIS — N138 Other obstructive and reflux uropathy: Secondary | ICD-10-CM | POA: Diagnosis not present

## 2017-02-26 DIAGNOSIS — N401 Enlarged prostate with lower urinary tract symptoms: Secondary | ICD-10-CM | POA: Diagnosis not present

## 2017-03-06 ENCOUNTER — Other Ambulatory Visit: Payer: Self-pay | Admitting: Family Medicine

## 2017-03-11 ENCOUNTER — Telehealth: Payer: Self-pay | Admitting: *Deleted

## 2017-03-11 NOTE — Telephone Encounter (Signed)
Received "Potential Clinical Concerns", for Drug-Drug Interaction with Clopidogrel and Omeprazole, stating "concurrent use should be avoided", as both medications "are potent CYP-2C19 inhibitors and may reduce clopidogrel levels leading to a reduction in platelet inhibition and increased risk of thrombosis". Patient has long history of DVTs and CVAs; he is followed by [facility], last OV with Dr. Erlinda Hong on 01/16/17, who prescribes his Plavix Dr [facility] as well]. Please Advise/SLS 06/25

## 2017-03-11 NOTE — Telephone Encounter (Signed)
Believe Pantoprazole is safer, check with pharmacy and prescribe Pantoprazole 40 mg po daily, disp #30 with 5 rf and/or 90 with 1

## 2017-03-12 MED ORDER — PANTOPRAZOLE SODIUM 40 MG PO TBEC: 40.0000 mg | DELAYED_RELEASE_TABLET | Freq: Every day | ORAL | 1 refills | Status: DC

## 2017-03-15 ENCOUNTER — Ambulatory Visit (INDEPENDENT_AMBULATORY_CARE_PROVIDER_SITE_OTHER): Payer: Medicare Other | Admitting: *Deleted

## 2017-03-15 DIAGNOSIS — I639 Cerebral infarction, unspecified: Secondary | ICD-10-CM

## 2017-03-15 NOTE — Progress Notes (Signed)
Carelink Summary Report / Loop Recorder 

## 2017-03-25 LAB — CUP PACEART REMOTE DEVICE CHECK
Date Time Interrogation Session: 20180629140743
MDC IDC PG IMPLANT DT: 20180130

## 2017-03-26 ENCOUNTER — Ambulatory Visit: Payer: Self-pay | Admitting: Neurology

## 2017-04-01 ENCOUNTER — Other Ambulatory Visit: Payer: Self-pay | Admitting: Family Medicine

## 2017-04-15 ENCOUNTER — Ambulatory Visit (INDEPENDENT_AMBULATORY_CARE_PROVIDER_SITE_OTHER): Payer: Medicare Other | Admitting: *Deleted

## 2017-04-15 DIAGNOSIS — I639 Cerebral infarction, unspecified: Secondary | ICD-10-CM

## 2017-04-16 NOTE — Progress Notes (Signed)
Carelink Summary Report / Loop Recorder 

## 2017-04-24 ENCOUNTER — Telehealth: Payer: Self-pay | Admitting: Family Medicine

## 2017-04-24 NOTE — Telephone Encounter (Signed)
°  Relation to XU:XYBF Call back number:[phone] Pharmacy: [place], [place] - [address] AT [address] [phone] (Phone) [phone] (Fax)     Reason for call:  Patient states he doesn't see a difference in the gabapentin (NEURONTIN) 100 MG capsule and would like to d/c medication, patient stats he cant sleep and he feels spaced out on the medication.   Patient would like PCP insight regarding prescribing cannabis oil.

## 2017-04-25 NOTE — Telephone Encounter (Signed)
Have not seen any concerning data on CBD oil that tells me he should definitely not use it. Have seen some people get some relief. He should do his research regarding the company he uses I do not know much about the companies. It is fine to stop the Gabapentin but just for future reference he was still on a fairly low dose and it could go higher to see if it helps

## 2017-04-25 NOTE — Telephone Encounter (Signed)
I have d/c the gabapentin.    Please advise about the cannabis oil?    PC

## 2017-04-25 NOTE — Telephone Encounter (Signed)
Patient contacted and informed of PCP response. He verbally understood/had no other questions at this time,

## 2017-04-27 LAB — CUP PACEART REMOTE DEVICE CHECK
Date Time Interrogation Session: 20180729143835
Implantable Pulse Generator Implant Date: 20180130

## 2017-04-27 NOTE — Progress Notes (Signed)
Carelink summary report received. Battery status OK. Normal device function. No new symptom episodes, tachy episodes, brady, or pause episodes. No new AF episodes. Monthly summary reports and ROV/PRN 

## 2017-05-14 ENCOUNTER — Ambulatory Visit (INDEPENDENT_AMBULATORY_CARE_PROVIDER_SITE_OTHER): Payer: Medicare Other | Admitting: *Deleted

## 2017-05-14 DIAGNOSIS — I639 Cerebral infarction, unspecified: Secondary | ICD-10-CM

## 2017-05-14 NOTE — Progress Notes (Signed)
Carelink Summary Report / Loop Recorder 

## 2017-05-16 LAB — CUP PACEART REMOTE DEVICE CHECK
Date Time Interrogation Session: 20180828185337
MDC IDC PG IMPLANT DT: 20180130

## 2017-06-06 ENCOUNTER — Other Ambulatory Visit: Payer: Self-pay | Admitting: Family Medicine

## 2017-06-07 NOTE — Telephone Encounter (Signed)
Per chart should be on Pantoprazole? thx dmf

## 2017-06-10 MED ORDER — PANTOPRAZOLE SODIUM 40 MG PO TBEC: 40.0000 mg | DELAYED_RELEASE_TABLET | Freq: Every day | ORAL | 1 refills | Status: DC

## 2017-06-10 NOTE — Telephone Encounter (Signed)
Pt called in to follow up on refill request. Pt says that he isn't sure which one that he should be taking but says that he is completely out and need a refill (Pantoprazole)   Pharmacy: [place] [facility] [place]: [phone]   Pt says that he is completely out.    Please assist further.

## 2017-06-10 NOTE — Telephone Encounter (Signed)
Pantoprazole sent to pharmacy   [facility]

## 2017-06-13 ENCOUNTER — Ambulatory Visit (INDEPENDENT_AMBULATORY_CARE_PROVIDER_SITE_OTHER): Payer: Medicare Other | Admitting: *Deleted

## 2017-06-13 DIAGNOSIS — I639 Cerebral infarction, unspecified: Secondary | ICD-10-CM | POA: Diagnosis not present

## 2017-06-14 ENCOUNTER — Other Ambulatory Visit: Payer: Self-pay | Admitting: Family Medicine

## 2017-06-14 LAB — CUP PACEART REMOTE DEVICE CHECK
MDC IDC PG IMPLANT DT: 20180130
MDC IDC SESS DTM: 20180927194006

## 2017-06-14 NOTE — Telephone Encounter (Signed)
Pt needs appt first/advised Jan to F/U in 6 months/thx dmf

## 2017-06-14 NOTE — Progress Notes (Signed)
Carelink Summary Report / Loop Recorder 

## 2017-06-22 ENCOUNTER — Other Ambulatory Visit: Payer: Self-pay | Admitting: Family Medicine

## 2017-06-23 DIAGNOSIS — Z23 Encounter for immunization: Secondary | ICD-10-CM | POA: Diagnosis not present

## 2017-06-24 NOTE — Telephone Encounter (Signed)
Jan 2018 @ [facility] was advised to F/U in 6 months/asked pharm to inform pt that needs to sched appt first/thx dmf

## 2017-06-25 ENCOUNTER — Other Ambulatory Visit: Payer: Self-pay | Admitting: Family Medicine

## 2017-06-27 NOTE — Telephone Encounter (Signed)
Pt called stating is completely out of meds and is needing at least a rx for this month so he can continue with rx. Pt was informed needed a 6 month fu appt. Pt is scheduled for Jul 16, 2017. Please advise ASAP.

## 2017-06-28 NOTE — Telephone Encounter (Signed)
Medication sent to pharmacy 30day supply

## 2017-07-03 ENCOUNTER — Other Ambulatory Visit: Payer: Self-pay | Admitting: Family Medicine

## 2017-07-15 ENCOUNTER — Ambulatory Visit (INDEPENDENT_AMBULATORY_CARE_PROVIDER_SITE_OTHER): Payer: Medicare Other | Admitting: *Deleted

## 2017-07-15 DIAGNOSIS — I639 Cerebral infarction, unspecified: Secondary | ICD-10-CM

## 2017-07-16 ENCOUNTER — Ambulatory Visit (INDEPENDENT_AMBULATORY_CARE_PROVIDER_SITE_OTHER): Payer: Medicare Other | Admitting: Family Medicine

## 2017-07-16 ENCOUNTER — Encounter: Payer: Self-pay | Admitting: Family Medicine

## 2017-07-16 DIAGNOSIS — I10 Essential (primary) hypertension: Secondary | ICD-10-CM | POA: Diagnosis not present

## 2017-07-16 DIAGNOSIS — R739 Hyperglycemia, unspecified: Secondary | ICD-10-CM | POA: Diagnosis not present

## 2017-07-16 DIAGNOSIS — M79642 Pain in left hand: Secondary | ICD-10-CM | POA: Diagnosis not present

## 2017-07-16 DIAGNOSIS — I639 Cerebral infarction, unspecified: Secondary | ICD-10-CM | POA: Diagnosis not present

## 2017-07-16 DIAGNOSIS — E782 Mixed hyperlipidemia: Secondary | ICD-10-CM

## 2017-07-16 MED ORDER — PREGABALIN 50 MG PO CAPS: 50.0000 mg | ORAL_CAPSULE | Freq: Two times a day (BID) | ORAL | 2 refills | Status: DC

## 2017-07-16 MED ORDER — FLUTICASONE PROPIONATE 50 MCG/ACT NA SUSP: NASAL | 6 refills | Status: DC

## 2017-07-16 NOTE — Progress Notes (Signed)
Carelink Summary Report / Loop Recorder 

## 2017-07-16 NOTE — Patient Instructions (Addendum)
Aspercreme, salon pas and icy hot make lidocaine gel Hypertension Hypertension, commonly called high blood pressure, is when the force of blood pumping through the arteries is too strong. The arteries are the blood vessels that carry blood from the heart throughout the body. Hypertension forces the heart to work harder to pump blood and may cause arteries to become narrow or stiff. Having untreated or uncontrolled hypertension can cause heart attacks, strokes, kidney disease, and other problems. A blood pressure reading consists of a higher number over a lower number. Ideally, your blood pressure should be below 120/80. The first ("top") number is called the systolic pressure. It is a measure of the pressure in your arteries as your heart beats. The second ("bottom") number is called the diastolic pressure. It is a measure of the pressure in your arteries as the heart relaxes. What are the causes? The cause of this condition is not known. What increases the risk? Some risk factors for high blood pressure are under your control. Others are not. Factors you can change  Smoking.  Having type 2 diabetes mellitus, high cholesterol, or both.  Not getting enough exercise or physical activity.  Being overweight.  Having too much fat, sugar, calories, or salt (sodium) in your diet.  Drinking too much alcohol. Factors that are difficult or impossible to change  Having chronic kidney disease.  Having a family history of high blood pressure.  Age. Risk increases with age.  Race. You may be at higher risk if you are African-American.  Gender. Men are at higher risk than women before age 63. After age 32, women are at higher risk than men.  Having obstructive sleep apnea.  Stress. What are the signs or symptoms? Extremely high blood pressure (hypertensive crisis) may cause:  Headache.  Anxiety.  Shortness of breath.  Nosebleed.  Nausea and vomiting.  Severe chest pain.  Jerky  movements you cannot control (seizures).  How is this diagnosed? This condition is diagnosed by measuring your blood pressure while you are seated, with your arm resting on a surface. The cuff of the blood pressure monitor will be placed directly against the skin of your upper arm at the level of your heart. It should be measured at least twice using the same arm. Certain conditions can cause a difference in blood pressure between your right and left arms. Certain factors can cause blood pressure readings to be lower or higher than normal (elevated) for a short period of time:  When your blood pressure is higher when you are in a health care provider's office than when you are at home, this is called white coat hypertension. Most people with this condition do not need medicines.  When your blood pressure is higher at home than when you are in a health care provider's office, this is called masked hypertension. Most people with this condition may need medicines to control blood pressure.  If you have a high blood pressure reading during one visit or you have normal blood pressure with other risk factors:  You may be asked to return on a different day to have your blood pressure checked again.  You may be asked to monitor your blood pressure at home for 1 week or longer.  If you are diagnosed with hypertension, you may have other blood or imaging tests to help your health care provider understand your overall risk for other conditions. How is this treated? This condition is treated by making healthy lifestyle changes, such as eating healthy  foods, exercising more, and reducing your alcohol intake. Your health care provider may prescribe medicine if lifestyle changes are not enough to get your blood pressure under control, and if:  Your systolic blood pressure is above 130.  Your diastolic blood pressure is above 80.  Your personal target blood pressure may vary depending on your medical  conditions, your age, and other factors. Follow these instructions at home: Eating and drinking  Eat a diet that is high in fiber and potassium, and low in sodium, added sugar, and fat. An example eating plan is called the DASH (Dietary Approaches to Stop Hypertension) diet. To eat this way: ? Eat plenty of fresh fruits and vegetables. Try to fill half of your plate at each meal with fruits and vegetables. ? Eat whole grains, such as whole wheat pasta, brown rice, or whole grain bread. Fill about one quarter of your plate with whole grains. ? Eat or drink low-fat dairy products, such as skim milk or low-fat yogurt. ? Avoid fatty cuts of meat, processed or cured meats, and poultry with skin. Fill about one quarter of your plate with lean proteins, such as fish, chicken without skin, beans, eggs, and tofu. ? Avoid premade and processed foods. These tend to be higher in sodium, added sugar, and fat.  Reduce your daily sodium intake. Most people with hypertension should eat less than 1,500 mg of sodium a day.  Limit alcohol intake to no more than 1 drink a day for nonpregnant women and 2 drinks a day for men. One drink equals 12 oz of beer, 5 oz of wine, or 1 oz of hard liquor. Lifestyle  Work with your health care provider to maintain a healthy body weight or to lose weight. Ask what an ideal weight is for you.  Get at least 30 minutes of exercise that causes your heart to beat faster (aerobic exercise) most days of the week. Activities may include walking, swimming, or biking.  Include exercise to strengthen your muscles (resistance exercise), such as pilates or lifting weights, as part of your weekly exercise routine. Try to do these types of exercises for 30 minutes at least 3 days a week.  Do not use any products that contain nicotine or tobacco, such as cigarettes and e-cigarettes. If you need help quitting, ask your health care provider.  Monitor your blood pressure at home as told by  your health care provider.  Keep all follow-up visits as told by your health care provider. This is important. Medicines  Take over-the-counter and prescription medicines only as told by your health care provider. Follow directions carefully. Blood pressure medicines must be taken as prescribed.  Do not skip doses of blood pressure medicine. Doing this puts you at risk for problems and can make the medicine less effective.  Ask your health care provider about side effects or reactions to medicines that you should watch for. Contact a health care provider if:  You think you are having a reaction to a medicine you are taking.  You have headaches that keep coming back (recurring).  You feel dizzy.  You have swelling in your ankles.  You have trouble with your vision. Get help right away if:  You develop a severe headache or confusion.  You have unusual weakness or numbness.  You feel faint.  You have severe pain in your chest or abdomen.  You vomit repeatedly.  You have trouble breathing. Summary  Hypertension is when the force of blood pumping through your  arteries is too strong. If this condition is not controlled, it may put you at risk for serious complications.  Your personal target blood pressure may vary depending on your medical conditions, your age, and other factors. For most people, a normal blood pressure is less than 120/80.  Hypertension is treated with lifestyle changes, medicines, or a combination of both. Lifestyle changes include weight loss, eating a healthy, low-sodium diet, exercising more, and limiting alcohol. This information is not intended to replace advice given to you by your health care provider. Make sure you discuss any questions you have with your health care provider. Document Released: 09/03/2005 Document Revised: 08/01/2016 Document Reviewed: 08/01/2016 Elsevier Interactive Patient Education  [employer].

## 2017-07-16 NOTE — Assessment & Plan Note (Signed)
Tolerating statin, encouraged heart healthy diet, avoid trans fats, minimize simple carbs and saturated fats. Increase exercise as tolerated 

## 2017-07-16 NOTE — Progress Notes (Signed)
Subjective:  I acted as a Education administrator for Dr. Charlett Blake. Paul Drake, [place]  Patient ID: Paul Drake, male    DOB: 1946-05-27, 71 y.o.   MRN: [id number]  No chief complaint on file.   HPI  Patient is in today for a 6 month follow up and overall he is doing well. His is most concerned about his persistent left hand pain the Gabapentin is not helping. The pain limits some of his daily activities. No recent febrile illness or hospitalizations. Denies CP/palp/SOB/HA/congestion/fevers/GI or GU c/o. Taking meds as prescribed  Patient Care Team: Mosie Lukes, MD as PCP - General (Family Medicine) Rosalin Hawking, MD as Consulting Physician (Neurology) Christy Sartorius, MD as Referring Physician (Urology) Ladene Artist, MD as Consulting Physician (Gastroenterology) Sheryn Bison, MD as Referring Physician (Dermatology)   Past Medical History:  Diagnosis Date  . Allergic state 12/29/2013  . Arthritis   . Barrett's esophagus   . Chicken pox   . CVA (cerebral infarction)   . Depression   . GERD (gastroesophageal reflux disease)   . HTN (hypertension), benign 04/01/2015  . Hyperglycemia 01/06/2015  . Hyperlipemia   . Hypertension   . IBS (irritable bowel syndrome)   . Kidney stones   . Left hand pain 07/21/2017  . Measles as a child  . Medicare annual wellness visit, subsequent 06/26/2015  . Mumps as a child  . Otitis externa 12/10/2013  . Seasonal allergies    some asthma  . Stroke ([place])   . Unspecified asthma(493.90) 12/29/2013    Past Surgical History:  Procedure Laterality Date  . COLONOSCOPY    . LITHOTRIPSY     multiple times  . WISDOM TOOTH EXTRACTION      Family History  Problem Relation Age of Onset  . Hypertension Mother   . Hyperlipidemia Mother   . Fibromyalgia Mother   . Arthritis Mother        rheumatoid  . Diabetes Sister        type 2  . Hyperlipidemia Brother   . Hypertension Brother   . Ulcers Father 36       Bleeding Ulcers  . Kidney Stones Daughter   .  Asthma Daughter   . Healthy Son   . Cancer Maternal Grandfather        skin ?  . Stroke Maternal Aunt   . Cancer Maternal Uncle        prostate    Social History   Socioeconomic History  . Marital status: Married    Spouse name: Webb Silversmith  . Number of children: 2  . Years of education: college  . Highest education level: Not on file  Social Needs  . Financial resource strain: Not on file  . Food insecurity - worry: Not on file  . Food insecurity - inability: Not on file  . Transportation needs - medical: Not on file  . Transportation needs - non-medical: Not on file  Occupational History    Comment: retired  Tobacco Use  . Smoking status: Never Smoker  . Smokeless tobacco: Never Used  Substance and Sexual Activity  . Alcohol use: Yes    Alcohol/week: 0.6 oz    Types: 1 Shots of liquor per week    Comment: 1-2 occasioinal  . Drug use: No  . 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 Not on file  Social History Narrative   Patient consumes  2 cups of caffeine daily    Outpatient Medications Prior to Visit  Medication Sig Dispense Refill  . albuterol (PROVENTIL HFA;VENTOLIN HFA) 108 (90 BASE) MCG/ACT inhaler Inhale 2 puffs into the lungs every 6 (six) hours as needed for wheezing or shortness of breath. 1 Inhaler 3  . cetirizine (ZYRTEC) 10 MG tablet Take 10 mg by mouth.    . citalopram (CELEXA) 20 MG tablet TAKE 1 TABLET(20 MG) BY MOUTH DAILY 30 tablet 0  . clopidogrel (PLAVIX) 75 MG tablet TAKE 1 TABLET(75 MG) BY MOUTH DAILY 90 tablet 2  . doxazosin (CARDURA) 4 MG tablet Take 0.5 tablets (2 mg total) by mouth daily. 15 tablet 11  . Multiple Vitamins-Minerals (MULTIVITAMINS THER. W/MINERALS) TABS tablet Take 1 tablet by mouth daily.    . Omega-3 Fatty Acids (OMEGA-3 PO) Take 1 capsule by mouth daily.    Marland Kitchen OVER THE COUNTER MEDICATION Take 1 tablet by mouth 2 (two) times daily. methylation  complete supplement    . pantoprazole (PROTONIX) 40 MG tablet Take 1 tablet (40 mg total) by mouth daily. 90 tablet 1  . Simethicone (GAS-X PO) Take 2 tablets by mouth daily as needed (gas).    . simvastatin (ZOCOR) 20 MG tablet TAKE 1 TABLET(20 MG) BY MOUTH DAILY 90 tablet 0  . doxazosin (CARDURA) 4 MG tablet Take 4 mg by mouth daily.    Marland Kitchen losartan (COZAAR) 100 MG tablet TAKE 1 TABLET(100 MG) BY MOUTH DAILY 90 tablet 1  . azelastine (ASTELIN) 0.1 % nasal spray Place 2 sprays into both nostrils 2 (two) times daily. Use in each nostril as directed 30 mL 5  . citalopram (CELEXA) 20 MG tablet TAKE 1 TABLET(20 MG) BY MOUTH DAILY 90 tablet 1  . fluticasone (FLONASE) 50 MCG/ACT nasal spray SHAKE WELL AND USE 2 SPRAYS IN EACH NOSTRIL DAILY 16 g 6   No facility-administered medications prior to visit.     Allergies  Allergen Reactions  . Pollen Extract Other (See Comments)    Runny nose,itchy eyes    Review of Systems  Constitutional: Negative for fever and malaise/fatigue.  HENT: Negative for congestion.   Eyes: Negative for blurred vision.  Respiratory: Negative for shortness of breath.   Cardiovascular: Negative for chest pain, palpitations and leg swelling.  Gastrointestinal: Negative for abdominal pain, blood in stool and nausea.  Genitourinary: Negative for dysuria and frequency.  Musculoskeletal: Positive for joint pain. Negative for falls.  Skin: Negative for rash.  Neurological: Positive for sensory change and focal weakness. Negative for dizziness, loss of consciousness and headaches.  Endo/Heme/Allergies: Negative for environmental allergies.  Psychiatric/Behavioral: Negative for depression. The patient is not nervous/anxious.        Objective:    Physical Exam  Constitutional: He is oriented to person, place, and time. He appears well-developed and well-nourished. No distress.  HENT:  Head: Normocephalic and atraumatic.  Nose: Nose normal.  Eyes: Right eye exhibits no  discharge. Left eye exhibits no discharge.  Neck: Normal range of motion. Neck supple.  Cardiovascular: Normal rate and regular rhythm.  No murmur heard. Pulmonary/Chest: Effort normal and breath sounds normal.  Abdominal: Soft. Bowel sounds are normal. There is no tenderness.  Musculoskeletal: He exhibits no edema.  Neurological: He is alert and oriented to person, place, and time.  Skin: Skin is warm and dry.  Psychiatric: He has a normal mood and affect.  Nursing note and vitals reviewed.   BP 120/62 (BP Location: Left Arm, Patient Position: Sitting, Cuff Size:  Normal)   Pulse (!) 59   Temp (!) 97.5 F (36.4 C) (Oral)   Resp 18   Wt 168 lb (76.2 kg)   SpO2 98%   BMI 25.54 kg/m  Wt Readings from Last 3 Encounters:  07/16/17 168 lb (76.2 kg)  01/16/17 178 lb (80.7 kg)  10/16/16 177 lb (80.3 kg)   BP Readings from Last 3 Encounters:  07/16/17 120/62  01/16/17 119/64  10/16/16 117/79     Immunization History  Administered Date(s) Administered  . Influenza, High Dose Seasonal PF 07/01/2017  . Influenza,inj,Quad PF,6+ Mos 05/17/2014, 05/20/2015, 07/05/2016  . Influenza-Unspecified 06/17/2013  . Pneumococcal Conjugate-13 10/15/2016  . Pneumococcal-Unspecified 06/17/2013  . Tdap 11/11/2013    Health Maintenance  Topic Date Due  . URINE MICROALBUMIN  06/02/1954  . COLONOSCOPY  10/01/2023  . TETANUS/TDAP  11/12/2023  . INFLUENZA VACCINE  Completed  . Hepatitis C Screening  Completed  . PNA vac Low Risk Adult  Completed    Lab Results  Component Value Date   WBC 5.6 09/14/2016   HGB 13.3 09/14/2016   HCT 39.9 09/14/2016   PLT 167 09/14/2016   GLUCOSE 198 (H) 09/14/2016   CHOL 158 10/20/2015   TRIG 90.0 10/20/2015   HDL 54.30 10/20/2015   LDLDIRECT 76.0 12/31/2014   LDLCALC 85 10/20/2015   ALT 16 02/03/2016   AST 22 02/03/2016   NA 136 09/14/2016   K 3.7 09/14/2016   CL 101 09/14/2016   CREATININE 1.11 09/14/2016   BUN 19 09/14/2016   CO2 28 09/14/2016    TSH 2.55 10/20/2015   INR 0.98 09/20/2014   HGBA1C 5.4 10/20/2015    Lab Results  Component Value Date   TSH 2.55 10/20/2015   Lab Results  Component Value Date   WBC 5.6 09/14/2016   HGB 13.3 09/14/2016   HCT 39.9 09/14/2016   MCV 99.3 09/14/2016   PLT 167 09/14/2016   Lab Results  Component Value Date   NA 136 09/14/2016   K 3.7 09/14/2016   CO2 28 09/14/2016   GLUCOSE 198 (H) 09/14/2016   BUN 19 09/14/2016   CREATININE 1.11 09/14/2016   BILITOT 0.7 02/03/2016   ALKPHOS 69 02/03/2016   AST 22 02/03/2016   ALT 16 02/03/2016   PROT 6.7 02/03/2016   ALBUMIN 4.3 02/03/2016   CALCIUM 9.3 09/14/2016   ANIONGAP 7 09/14/2016   GFR 70.00 02/03/2016   Lab Results  Component Value Date   CHOL 158 10/20/2015   Lab Results  Component Value Date   HDL 54.30 10/20/2015   Lab Results  Component Value Date   LDLCALC 85 10/20/2015   Lab Results  Component Value Date   TRIG 90.0 10/20/2015   Lab Results  Component Value Date   CHOLHDL 3 10/20/2015   Lab Results  Component Value Date   HGBA1C 5.4 10/20/2015         Assessment & Plan:   Problem List Items Addressed This Visit    Hyperglycemia    hgba1c acceptable, minimize simple carbs. Increase exercise as tolerated. Continue current meds      Relevant Orders   Hemoglobin A1c   HTN (hypertension), benign    Well controlled, no changes to meds. Encouraged heart healthy diet such as the DASH diet and exercise as tolerated.       Relevant Medications   doxazosin (CARDURA) 4 MG tablet   Other Relevant Orders   CBC   Comprehensive metabolic panel   TSH  HLD (hyperlipidemia)    Tolerating statin, encouraged heart healthy diet, avoid trans fats, minimize simple carbs and saturated fats. Increase exercise as tolerated      Relevant Medications   doxazosin (CARDURA) 4 MG tablet   Other Relevant Orders   Lipid panel   Left hand pain    Chronic since his right sided stroke but worse lately. Gabapentin  not helping. Switch to Lyrica and reevaluate. Try topical treatments as well.         I have discontinued [name] Avis's azelastine and losartan. I have also changed his doxazosin. Additionally, I am having him start on pregabalin. Lastly, I am having him maintain his multivitamins ther. w/minerals, albuterol, cetirizine, Omega-3 Fatty Acids (OMEGA-3 PO), OVER THE COUNTER MEDICATION, Simethicone (GAS-X PO), clopidogrel, pantoprazole, citalopram, simvastatin, and fluticasone.  Meds ordered this encounter  Medications  . DISCONTD: doxazosin (CARDURA) 4 MG tablet    Sig: Take 4 mg by mouth daily.  . fluticasone (FLONASE) 50 MCG/ACT nasal spray    Sig: SHAKE WELL AND USE 2 SPRAYS IN EACH NOSTRIL DAILY    Dispense:  16 g    Refill:  6  . pregabalin (LYRICA) 50 MG capsule    Sig: Take 1 capsule (50 mg total) by mouth 2 (two) times daily.    Dispense:  60 capsule    Refill:  2  . doxazosin (CARDURA) 4 MG tablet    Sig: Take 0.5 tablets (2 mg total) by mouth daily.    Dispense:  15 tablet    Refill:  11    CMA served as scribe during this visit. History, Physical and Plan performed by medical provider. Documentation and orders reviewed and attested to.  Penni Homans, MD

## 2017-07-16 NOTE — Assessment & Plan Note (Signed)
Well controlled, no changes to meds. Encouraged heart healthy diet such as the DASH diet and exercise as tolerated.  °

## 2017-07-16 NOTE — Assessment & Plan Note (Signed)
hgba1c acceptable, minimize simple carbs. Increase exercise as tolerated. Continue current meds 

## 2017-07-19 LAB — CUP PACEART REMOTE DEVICE CHECK
Date Time Interrogation Session: 20181027201355
MDC IDC PG IMPLANT DT: 20180130

## 2017-07-21 ENCOUNTER — Encounter: Payer: Self-pay | Admitting: Family Medicine

## 2017-07-21 DIAGNOSIS — M79642 Pain in left hand: Secondary | ICD-10-CM | POA: Insufficient documentation

## 2017-07-21 HISTORY — DX: Pain in left hand: M79.642

## 2017-07-21 NOTE — Assessment & Plan Note (Signed)
Chronic since his right sided stroke but worse lately. Gabapentin not helping. Switch to Lyrica and reevaluate. Try topical treatments as well.

## 2017-07-29 ENCOUNTER — Telehealth: Payer: Self-pay | Admitting: Neurology

## 2017-07-29 ENCOUNTER — Other Ambulatory Visit: Payer: Self-pay | Admitting: Family Medicine

## 2017-07-29 ENCOUNTER — Encounter: Payer: Self-pay | Admitting: Neurology

## 2017-07-29 ENCOUNTER — Ambulatory Visit (INDEPENDENT_AMBULATORY_CARE_PROVIDER_SITE_OTHER): Payer: Medicare Other | Admitting: Neurology

## 2017-07-29 VITALS — BP 105/67 | HR 75 | Wt 167.0 lb

## 2017-07-29 DIAGNOSIS — E785 Hyperlipidemia, unspecified: Secondary | ICD-10-CM | POA: Diagnosis not present

## 2017-07-29 DIAGNOSIS — Q211 Atrial septal defect: Secondary | ICD-10-CM | POA: Diagnosis not present

## 2017-07-29 DIAGNOSIS — I63411 Cerebral infarction due to embolism of right middle cerebral artery: Secondary | ICD-10-CM | POA: Diagnosis not present

## 2017-07-29 DIAGNOSIS — I639 Cerebral infarction, unspecified: Secondary | ICD-10-CM | POA: Diagnosis not present

## 2017-07-29 DIAGNOSIS — Z8673 Personal history of transient ischemic attack (TIA), and cerebral infarction without residual deficits: Secondary | ICD-10-CM

## 2017-07-29 DIAGNOSIS — G450 Vertebro-basilar artery syndrome: Secondary | ICD-10-CM | POA: Diagnosis not present

## 2017-07-29 DIAGNOSIS — I10 Essential (primary) hypertension: Secondary | ICD-10-CM

## 2017-07-29 DIAGNOSIS — Q2112 Patent foramen ovale: Secondary | ICD-10-CM

## 2017-07-29 DIAGNOSIS — I82531 Chronic embolism and thrombosis of right popliteal vein: Secondary | ICD-10-CM | POA: Diagnosis not present

## 2017-07-29 MED ORDER — GABAPENTIN 100 MG PO CAPS: 200.0000 mg | ORAL_CAPSULE | Freq: Every day | ORAL | 5 refills | Status: DC

## 2017-07-29 NOTE — Telephone Encounter (Signed)
I have refilled gabapentin to his pharmacy.   Rosalin Hawking, MD PhD Stroke Neurology 07/29/2017 5:12 PM  Meds ordered this encounter  Medications  . gabapentin (NEURONTIN) 100 MG capsule    Sig: Take 2 capsules (200 mg total) at bedtime by mouth.    Dispense:  60 capsule    Refill:  5

## 2017-07-29 NOTE — Patient Instructions (Addendum)
-   continue plavix and zocor for stroke prevention for now - continue neurontin 200mg  or 300mg  at night daily for the numbness and pain.  - Check BP at home and record. - continue monitoring loop recorder - Follow up with your primary care physician for stroke risk factor modification. Recommend maintain blood pressure goal 120-130/80, diabetes with hemoglobin A1c goal below 6.5% and lipids with LDL cholesterol goal below 70 mg/dL.  - follow up in 6 months with me.

## 2017-07-29 NOTE — Telephone Encounter (Signed)
Pt request refill for gabapentin (NEURONTIN) 100 MG capsule sent to [facility].

## 2017-07-29 NOTE — Progress Notes (Signed)
STROKE NEUROLOGY FOLLOW UP NOTE  NAME: Paul Drake DOB: November 10, 1945  REASON FOR VISIT: stroke follow up HISTORY FROM: pt and chart  Today we had the pleasure of seeing Paul Drake in follow-up at our Neurology Clinic. Pt was accompanied by no one.   History Summary Paul Drake is a LH 71 y.o. male with a history of right MCA infarct in 2008 with residue LLE minimal weakness was admitted on 09/20/14 for acute onset left sided weakness and difficulty with speech.he received tPA. His acute right frontal stroke is adjacent to your previous stroke in 2008 shown in MRI. Stroke work up showed PFO with valsalva on TEE and positive right LE DVT. He was put on Xarelto 15mg  twice a day for 21 days and then 20mg  daily for 5 months. He mentioned that before this happened he also had some diarrhea and he might have done valsalva maneuvers then.   Patient's previous stroke was in 2008 and the patient has a resultant LLE numbness and weakness. Patient was on ASA but stopped it about a month ago due to gastritis.He denies N/V. However, shortly after the gastritis, he developed kidney stone. He may have a lot of pain which caused him to have valsalva maneuvers.   Follow up 11/24/14 - the patient has been doing well. No recurrent stroke like symptoms. He still has left hand numbness which causing trouble with writing and handling tools. He is on Xarelto daily. He followed up with his PCP closely. His BP today in clinic is 136/82. He still has OT twice a week but will soon be completed. His hypercoagulable work up in hospital was negative.   05/09/15 follow up - the patient has been doing well, no recurrent strokelike symptoms. He is still on Xarelto without side effect. However he continued to suffer from kidney stone, sometimes really painful. Urologist hesitant to do lithotripsy due to bleeding risk. He has been on Xarelto for more than 6 months now. He still reports intermittent left-sided numbness and pain,  especially fingers at left hand. No other complaints, blood pressure 125/78. TCD confirmed PFO and no MES on 30 min monitoring.  08/15/15 follow up - pt has been doing well without stroke like symptoms on plavix. His kidney stone was taken out by cystoscopy and did not require surgery. He continues to complain of left hand numbness and tingling, feeling swallow. However, he only takes once a day dose of neurontin and feels drowsiness with neurontin. BP 123/75 in clinic today.    02/14/16 follow up - pt has been doing well from stroke standpoint. Still has left hand numbness especially after active exercise with golf practice. Had kidney stone removal and abdominal pain much improved. However, recently started again belly pain but in different location, more epigastric pain, had CT abdomen which was negative. Considered diverticulitis and put on Abx last week, and seems getting better. BP today was low, 95/62, will decrease losartan dose.   09/26/16 follow up - pt had kidney stone surgery in 07/2016 and off plavix for 5-7 days and currently plavix resumed. However, on 09/14/16, he was walking on the street, had sudden onset staggering, imbalance and kept leaning towards right. He felt dizzy, but not vertigo, no fall, no double vision or speech difficulty. No vomiting but mild nausea. It lasted 15-20 sec and resolved. He managed to get home. Wife called EMS and he was sent to ED. CT head negative. He refused further study and come today for follow up. Denies  leg pain or swelling. BP 104/72  01/16/17 follow up - pt has been doing well, no recurrent stroke like symptoms. LE venous doppler showed no DVT, and CTA head and neck unremarkable. Discussed with Dr. Rayann Heman and loop recorder placed on 10/16/16. So far no afib found. On gabapentin, helps but not able to tolerate higher dose. Would like to continue the low dose. BP 119/64  Interval History During the interval time, pt has been doing well, no recurrent  strokelike symptoms.  Loop recorder no A. fib so far.  Still has intermittent left hand numbness and painful sensation.  Has been taking gabapentin intermittently, felt effective but having drowsy sleepy side effect of the day, tolerating well if only take at night.  BP today in clinic 105/68. Has stopped zocor by himself, but following with PCP for HLD.    REVIEW OF SYSTEMS: Full 14 system review of systems performed and notable only for those listed below and in HPI above, all others are negative:  Constitutional:   Cardiovascular:  Ear/Nose/Throat: Runny nose Skin:  Eyes: Eye discharge, eye itching Respiratory:   Gastroitestinal:   Genitourinary: frequency of urination Hematology/Lymphatic:   Endocrine: Cold intolerance Musculoskeletal: Neck pain Allergy/Immunology:   Neurological:  Dizziness and headache Psychiatric:  Sleep: Frequent waking, sleep talking, acting out dreams  The following represents the patient's updated allergies and side effects list: Allergies  Allergen Reactions  . Pollen Extract Other (See Comments)    Runny nose,itchy eyes    The neurologically relevant items on the patient's problem list were reviewed on today's visit.  Neurologic Examination  A problem focused neurological exam (12 or more points of the single system neurologic examination, vital signs counts as 1 point, cranial nerves count for 8 points) was performed.  Blood pressure 105/67, pulse 75, weight 167 lb (75.8 kg).  General - Well nourished, well developed, in no apparent distress.  Ophthalmologic - Sharp disc margins OU.  Cardiovascular - Regular rate and rhythm with no murmur.  Mental Status -  Level of arousal and orientation to time, place, and person were intact. Language including expression, naming, repetition, comprehension, reading, and writing was assessed and found intact. Attention span and concentration were normal. Recent and remote memory were intact. Fund of  Knowledge was assessed and was intact.  Cranial Nerves II - XII - II - Visual field intact OU. III, IV, VI - Extraocular movements intact. V - Facial sensation intact bilaterally. VII - Facial movement intact bilaterally. VIII - Hearing & vestibular intact bilaterally. X - Palate elevates symmetrically. XI - Chin turning & shoulder shrug intact bilaterally. XII - Tongue protrusion intact.  Motor Strength - The patient's strength was normal in all extremities and pronator drift was absent.  Bulk was normal and fasciculations were absent.   Motor Tone - Muscle tone was assessed at the neck and appendages and was normal.  Reflexes - The patient's reflexes were normal in all extremities and he had no pathological reflexes.  Sensory - Light touch, temperature/pinprick decreased on the left ulnar hand and all fingertips and Romberg testing were assessed and were normal.    Coordination - The patient had normal movements in the hands and feet with no ataxia or dysmetria.  Tremor was absent.  Gait and Station - The patient's transfers, posture, gait, station, and turns were observed as normal.  Data reviewed: I personally reviewed the images and agree with the radiology interpretations.  Ct Head (brain) Wo Contrast  09/20/2014 IMPRESSION: 1. No  acute intracranial pathology seen on CT. 2. Chronic infarct at the high right parietal region, with associated encephalomalacia. 3. Mild cortical volume loss and scattered small vessel ischemic microangiopathy.   Carotid Doppler Bilateral: 1-39% ICA stenosis. Vertebral artery flow is antegrade.  MRI HEAD Moderate size remote infarct posterior right frontal -parietal lobe with encephalomalacia. Small to moderate size acute infarct along the anterior margin of this remote infarct involving the posterior right frontal lobe extending to the posterior right operculum region. No intracranial hemorrhage.  MRA HEAD Decrease number of visualized  right middle cerebral artery branch vessels consistent with patient's remote and acute infarct. No significant stenosis proximal to the branch vessel level. Ectatic vertebral arteries and basilar artery with slight irregularity of the distal left vertebral artery without significant stenosis. Nonvisualized right posterior inferior cerebellar artery. Mild posterior cerebral artery branch vessel irregularity and narrowing more notable on the right.  EKG normal EKG, normal sinus rhythm. For complete results please see formal report.  LE venous doppler - The right lower extremity is positive for deep vein thrombosis involving the right gastrocnemius, posterior tibial, and peroneal veins. There is no evidence of left lower extremity deep vein thrombosis.  TEE -  1) PFO by bubble study not visualized Positive bubble study only with valsalva 2) Normal EF 60% 3) No LAA thrombus 4) Trivial MR 5) Mild AR 6) No aortic debris 7) Mild TR 8) Normal RV  TCD bubble study positive with grade I at rest and grade III with valsalva.  TCD emboli detection - negative for MES.    LE venous doppler 09/27/16 - No evidence of deep vein or superficial thrombosis involving the   right lower extremity and left lower extremity. - No evidence of Baker&'s cyst on the right or left.  CTA head and neck 10/01/16 1. No posterior circulation stenosis to suggest vertebrobasilar insufficiency. Mild atherosclerosis noted in the proximal left vertebral artery. 2. Mild carotid circulation atherosclerosis without flow limiting stenosis. 3. Remote right MCA territory infarct. No evidence of interval infarct.  Component     Latest Ref Rng & Units 04/01/2015 10/20/2015  Cholesterol     0 - 200 mg/dL 158 158  Triglycerides     0.0 - 149.0 mg/dL 167.0 (H) 90.0  HDL Cholesterol     >39.00 mg/dL 55.10 54.30  VLDL     0.0 - 40.0 mg/dL 33.4 18.0  LDL (calc)     0 - 99 mg/dL 70 85  Total CHOL/HDL Ratio      3 3    NonHDL      102.90 103.44  TSH     0.35 - 4.50 uIU/mL  2.55  Hemoglobin A1C     4.6 - 6.5 %  5.4    Assessment: As you may recall, he is a 71 y.o. Caucasian male with PMH of right MCA CVA in 2008 was admitted on1/4/16 for again right MCA small stroke. The first stroke more at right motor strip and the last stroke more at sensory strip. Stroke w/u showed positive DVT and a likely small PFO with RLS only on valsalva maneuver by TEE. However, TCD bubble study showed grade 1 RLS at rest and agrees 3 RLS with Valsalva maneuver. TCD MES negative. For the two strokes, it is likely pt had some valsalva maneuver prior to the strokes. He was on Xarelto for more than 6 months and due to kidney stone surgery with bleeding risk, he optioned to switch Xarelto to plavix for stroke prevention.  Had TIA symptoms with leaning towards right and imbalance gait on 09/14/16. CT negative. LE venous doppler and CTA head and neck all negative. Loop recorder placed so far no afib.  Still has left hand numbness/pain, on gabapentin intermittently, effective, but only tolerate if taken at night. Has stopped zocor by himself, but following with PCP for HLD.   Plan:  - continue plavix for stroke prevention for now - follow with PCP for cholesterol monitoring, resume zocor if LDL still high - continue neurontin 200mg  or 300mg  at night daily for the numbness and pain.  - Check BP at home and record. - continue monitoring loop recorder - Follow up with your primary care physician for stroke risk factor modification. Recommend maintain blood pressure goal 120-130/80, diabetes with hemoglobin A1c goal below 6.5% and lipids with LDL cholesterol goal below 70 mg/dL.  - follow up in 6 months with me.  I spent more than 25 minutes of face to face time with the patient. Greater than 50% of time was spent in counseling and coordination of care. We discussed loop recorder monitoring, gabapentin use and BP monitoring.   No orders of  the defined types were placed in this encounter.   Meds ordered this encounter  Medications  . gabapentin (NEURONTIN) 100 MG capsule    Sig: Take 100 mg as needed by mouth.    Patient Instructions  - continue plavix and zocor for stroke prevention for now - continue neurontin 200mg  or 300mg  at night daily for the numbness and pain.  - Check BP at home and record. - continue monitoring loop recorder - Follow up with your primary care physician for stroke risk factor modification. Recommend maintain blood pressure goal 120-130/80, diabetes with hemoglobin A1c goal below 6.5% and lipids with LDL cholesterol goal below 70 mg/dL.  - follow up in 6 months with me.   Rosalin Hawking, MD PhD [facility] [address], [place], [place] [address] [phone]

## 2017-07-30 ENCOUNTER — Other Ambulatory Visit (INDEPENDENT_AMBULATORY_CARE_PROVIDER_SITE_OTHER): Payer: Medicare Other

## 2017-07-30 DIAGNOSIS — E782 Mixed hyperlipidemia: Secondary | ICD-10-CM

## 2017-07-30 DIAGNOSIS — R739 Hyperglycemia, unspecified: Secondary | ICD-10-CM | POA: Diagnosis not present

## 2017-07-30 DIAGNOSIS — I10 Essential (primary) hypertension: Secondary | ICD-10-CM | POA: Diagnosis not present

## 2017-07-30 LAB — LIPID PANEL
Cholesterol: 139 mg/dL (ref 0–200)
HDL: 54.1 mg/dL (ref >39.00)
LDL CALC: 68 mg/dL (ref 0–99)
NONHDL: 84.68
Total CHOL/HDL Ratio: 3
Triglycerides: 84 mg/dL (ref 0.0–149.0)
VLDL: 16.8 mg/dL (ref 0.0–40.0)

## 2017-07-30 LAB — COMPREHENSIVE METABOLIC PANEL
ALBUMIN: 4 g/dL (ref 3.5–5.2)
ALT: 12 U/L (ref 0–53)
AST: 17 U/L (ref 0–37)
Alkaline Phosphatase: 52 U/L (ref 39–117)
BUN: 22 mg/dL (ref 6–23)
CALCIUM: 9.4 mg/dL (ref 8.4–10.5)
CO2: 30 meq/L (ref 19–32)
CREATININE: 0.99 mg/dL (ref 0.40–1.50)
Chloride: 104 mEq/L (ref 96–112)
GFR: 78.72 mL/min (ref >60.00)
Glucose, Bld: 98 mg/dL (ref 70–99)
Potassium: 4.4 mEq/L (ref 3.5–5.1)
Sodium: 139 mEq/L (ref 135–145)
TOTAL PROTEIN: 6.6 g/dL (ref 6.0–8.3)
Total Bilirubin: 0.4 mg/dL (ref 0.2–1.2)

## 2017-07-30 LAB — CBC
HEMATOCRIT: 41 % (ref 39.0–52.0)
Hemoglobin: 13.7 g/dL (ref 13.0–17.0)
MCHC: 33.4 g/dL (ref 30.0–36.0)
MCV: 98.5 fl (ref 78.0–100.0)
Platelets: 148 10*3/uL — ABNORMAL LOW (ref 150.0–400.0)
RBC: 4.16 Mil/uL — AB (ref 4.22–5.81)
RDW: 12.9 % (ref 11.5–15.5)
WBC: 4.2 10*3/uL (ref 4.0–10.5)

## 2017-07-30 LAB — TSH: TSH: 1.76 u[IU]/mL (ref 0.35–4.50)

## 2017-07-30 LAB — HEMOGLOBIN A1C: HEMOGLOBIN A1C: 5.3 % (ref 4.6–6.5)

## 2017-08-06 DIAGNOSIS — L905 Scar conditions and fibrosis of skin: Secondary | ICD-10-CM | POA: Diagnosis not present

## 2017-08-06 DIAGNOSIS — Z08 Encounter for follow-up examination after completed treatment for malignant neoplasm: Secondary | ICD-10-CM | POA: Diagnosis not present

## 2017-08-06 DIAGNOSIS — Z85828 Personal history of other malignant neoplasm of skin: Secondary | ICD-10-CM | POA: Diagnosis not present

## 2017-08-06 DIAGNOSIS — L821 Other seborrheic keratosis: Secondary | ICD-10-CM | POA: Diagnosis not present

## 2017-08-06 DIAGNOSIS — L820 Inflamed seborrheic keratosis: Secondary | ICD-10-CM | POA: Diagnosis not present

## 2017-08-06 DIAGNOSIS — L570 Actinic keratosis: Secondary | ICD-10-CM | POA: Diagnosis not present

## 2017-08-12 ENCOUNTER — Ambulatory Visit (INDEPENDENT_AMBULATORY_CARE_PROVIDER_SITE_OTHER): Payer: Medicare Other | Admitting: *Deleted

## 2017-08-12 DIAGNOSIS — I639 Cerebral infarction, unspecified: Secondary | ICD-10-CM

## 2017-08-13 DIAGNOSIS — N138 Other obstructive and reflux uropathy: Secondary | ICD-10-CM | POA: Diagnosis not present

## 2017-08-13 DIAGNOSIS — N200 Calculus of kidney: Secondary | ICD-10-CM | POA: Diagnosis not present

## 2017-08-13 DIAGNOSIS — N401 Enlarged prostate with lower urinary tract symptoms: Secondary | ICD-10-CM | POA: Diagnosis not present

## 2017-08-13 NOTE — Progress Notes (Signed)
Carelink Summary Report / Loop Recorder 

## 2017-08-17 HISTORY — PX: PROSTATE SURGERY: SHX751

## 2017-08-19 ENCOUNTER — Encounter: Payer: Self-pay | Admitting: Gastroenterology

## 2017-08-27 LAB — CUP PACEART REMOTE DEVICE CHECK
Implantable Pulse Generator Implant Date: 20180130
MDC IDC SESS DTM: 20181126210212

## 2017-08-30 ENCOUNTER — Other Ambulatory Visit: Payer: Self-pay | Admitting: Family Medicine

## 2017-09-02 DIAGNOSIS — N401 Enlarged prostate with lower urinary tract symptoms: Secondary | ICD-10-CM | POA: Diagnosis not present

## 2017-09-02 DIAGNOSIS — R001 Bradycardia, unspecified: Secondary | ICD-10-CM | POA: Diagnosis not present

## 2017-09-02 DIAGNOSIS — Z01812 Encounter for preprocedural laboratory examination: Secondary | ICD-10-CM | POA: Diagnosis not present

## 2017-09-02 DIAGNOSIS — N138 Other obstructive and reflux uropathy: Secondary | ICD-10-CM | POA: Diagnosis not present

## 2017-09-05 DIAGNOSIS — N401 Enlarged prostate with lower urinary tract symptoms: Secondary | ICD-10-CM | POA: Diagnosis not present

## 2017-09-05 DIAGNOSIS — N411 Chronic prostatitis: Secondary | ICD-10-CM | POA: Diagnosis not present

## 2017-09-05 DIAGNOSIS — Z87442 Personal history of urinary calculi: Secondary | ICD-10-CM | POA: Diagnosis not present

## 2017-09-05 DIAGNOSIS — N138 Other obstructive and reflux uropathy: Secondary | ICD-10-CM | POA: Diagnosis not present

## 2017-09-06 DIAGNOSIS — Z87442 Personal history of urinary calculi: Secondary | ICD-10-CM | POA: Diagnosis not present

## 2017-09-06 DIAGNOSIS — N411 Chronic prostatitis: Secondary | ICD-10-CM | POA: Diagnosis not present

## 2017-09-06 DIAGNOSIS — N401 Enlarged prostate with lower urinary tract symptoms: Secondary | ICD-10-CM | POA: Diagnosis not present

## 2017-09-06 DIAGNOSIS — N138 Other obstructive and reflux uropathy: Secondary | ICD-10-CM | POA: Diagnosis not present

## 2017-09-11 ENCOUNTER — Ambulatory Visit (INDEPENDENT_AMBULATORY_CARE_PROVIDER_SITE_OTHER): Payer: Medicare Other | Admitting: *Deleted

## 2017-09-11 DIAGNOSIS — I639 Cerebral infarction, unspecified: Secondary | ICD-10-CM | POA: Diagnosis not present

## 2017-09-12 NOTE — Progress Notes (Signed)
Carelink Summary Report / Loop Recorder 

## 2017-09-22 LAB — CUP PACEART REMOTE DEVICE CHECK
Implantable Pulse Generator Implant Date: 20180130
MDC IDC SESS DTM: 20181226204133

## 2017-09-23 DIAGNOSIS — N401 Enlarged prostate with lower urinary tract symptoms: Secondary | ICD-10-CM | POA: Diagnosis not present

## 2017-09-23 DIAGNOSIS — N138 Other obstructive and reflux uropathy: Secondary | ICD-10-CM | POA: Diagnosis not present

## 2017-09-30 ENCOUNTER — Other Ambulatory Visit: Payer: Self-pay | Admitting: Family Medicine

## 2017-10-11 ENCOUNTER — Ambulatory Visit (INDEPENDENT_AMBULATORY_CARE_PROVIDER_SITE_OTHER): Payer: Medicare Other | Admitting: *Deleted

## 2017-10-11 DIAGNOSIS — I639 Cerebral infarction, unspecified: Secondary | ICD-10-CM | POA: Diagnosis not present

## 2017-10-14 ENCOUNTER — Other Ambulatory Visit: Payer: Self-pay | Admitting: Neurology

## 2017-10-14 MED ORDER — GABAPENTIN 100 MG PO CAPS: 200.0000 mg | ORAL_CAPSULE | Freq: Three times a day (TID) | ORAL | 5 refills | Status: DC

## 2017-10-14 NOTE — Telephone Encounter (Signed)
Called pt and he stated that he is on gabapentin 200mg  bid and requesting tid. I think it is reasonable. Will refill his meds. Thanks.   Rosalin Hawking, MD PhD Stroke Neurology 10/14/2017 4:17 PM  Meds ordered this encounter  Medications  . gabapentin (NEURONTIN) 100 MG capsule    Sig: Take 2 capsules (200 mg total) by mouth 3 (three) times daily.    Dispense:  180 capsule    Refill:  5

## 2017-10-14 NOTE — Telephone Encounter (Signed)
Pt states his prescription is for twice a day but he has had to increase and now takes 3 a day.  Pt would like a call to know if Dr Erlinda Hong will allow him a revised Rx for 3 a day.  Please call, pt still uses  [facility] [phone] - [place], [place] - [address] AT [place] [phone] (Phone) [phone] (Fax)   Pt has been made aware that Dr Erlinda Hong is not in office this week.

## 2017-10-14 NOTE — Progress Notes (Signed)
Carelink Summary Report / Loop Recorder 

## 2017-10-14 NOTE — Telephone Encounter (Signed)
Rn call patient that medication was sent to his pharmacy in [place]. Pt stated Dr. Erlinda Hong had already call him. Pt verbalized the understanding of 2 tablets three times a day.

## 2017-10-14 NOTE — Telephone Encounter (Signed)
Message sent to Dr. Erlinda Hong concerning pt had to increase to 3 times a day. Pt would like a new rx if Dr. Erlinda Hong increase the gabapentin to 3 times a day.

## 2017-10-14 NOTE — Addendum Note (Signed)
Addended by: Rosalin Hawking on: 10/14/2017 04:19 PM   Modules accepted: Orders

## 2017-10-15 ENCOUNTER — Ambulatory Visit (INDEPENDENT_AMBULATORY_CARE_PROVIDER_SITE_OTHER): Payer: Medicare Other | Admitting: Family Medicine

## 2017-10-15 ENCOUNTER — Encounter: Payer: Self-pay | Admitting: Family Medicine

## 2017-10-15 VITALS — BP 116/72 | HR 56 | Temp 98.0°F | Resp 18 | Wt 162.6 lb

## 2017-10-15 DIAGNOSIS — E785 Hyperlipidemia, unspecified: Secondary | ICD-10-CM

## 2017-10-15 DIAGNOSIS — F418 Other specified anxiety disorders: Secondary | ICD-10-CM

## 2017-10-15 DIAGNOSIS — R300 Dysuria: Secondary | ICD-10-CM

## 2017-10-15 DIAGNOSIS — R351 Nocturia: Secondary | ICD-10-CM | POA: Diagnosis not present

## 2017-10-15 DIAGNOSIS — N401 Enlarged prostate with lower urinary tract symptoms: Secondary | ICD-10-CM

## 2017-10-15 DIAGNOSIS — I10 Essential (primary) hypertension: Secondary | ICD-10-CM

## 2017-10-15 DIAGNOSIS — R739 Hyperglycemia, unspecified: Secondary | ICD-10-CM

## 2017-10-15 DIAGNOSIS — I639 Cerebral infarction, unspecified: Secondary | ICD-10-CM | POA: Diagnosis not present

## 2017-10-15 LAB — TSH: TSH: 2.12 u[IU]/mL (ref 0.35–4.50)

## 2017-10-15 LAB — COMPREHENSIVE METABOLIC PANEL
ALBUMIN: 4.1 g/dL (ref 3.5–5.2)
ALT: 11 U/L (ref 0–53)
AST: 17 U/L (ref 0–37)
Alkaline Phosphatase: 58 U/L (ref 39–117)
BILIRUBIN TOTAL: 0.9 mg/dL (ref 0.2–1.2)
BUN: 22 mg/dL (ref 6–23)
CALCIUM: 9.4 mg/dL (ref 8.4–10.5)
CO2: 31 mEq/L (ref 19–32)
CREATININE: 1.15 mg/dL (ref 0.40–1.50)
Chloride: 102 mEq/L (ref 96–112)
GFR: 66.19 mL/min (ref >60.00)
Glucose, Bld: 109 mg/dL — ABNORMAL HIGH (ref 70–99)
Potassium: 4.6 mEq/L (ref 3.5–5.1)
Sodium: 139 mEq/L (ref 135–145)
Total Protein: 6.8 g/dL (ref 6.0–8.3)

## 2017-10-15 LAB — CBC
HCT: 40.9 % (ref 39.0–52.0)
Hemoglobin: 13.8 g/dL (ref 13.0–17.0)
MCHC: 33.8 g/dL (ref 30.0–36.0)
MCV: 96.1 fl (ref 78.0–100.0)
Platelets: 181 K/uL (ref 150.0–400.0)
RBC: 4.26 Mil/uL (ref 4.22–5.81)
RDW: 12.6 % (ref 11.5–15.5)
WBC: 6 K/uL (ref 4.0–10.5)

## 2017-10-15 LAB — HEMOGLOBIN A1C: Hgb A1c MFr Bld: 5.3 % (ref 4.6–6.5)

## 2017-10-15 LAB — LIPID PANEL
Cholesterol: 205 mg/dL — ABNORMAL HIGH (ref 0–200)
HDL: 64.8 mg/dL
LDL Cholesterol: 120 mg/dL — ABNORMAL HIGH (ref 0–99)
NonHDL: 139.92
Total CHOL/HDL Ratio: 3
Triglycerides: 102 mg/dL (ref 0.0–149.0)
VLDL: 20.4 mg/dL (ref 0.0–40.0)

## 2017-10-15 LAB — SEDIMENTATION RATE: Sed Rate: 3 mm/hr (ref 0–20)

## 2017-10-15 MED ORDER — CITALOPRAM HYDROBROMIDE 20 MG PO TABS: 20.0000 mg | ORAL_TABLET | Freq: Two times a day (BID) | ORAL | 3 refills | Status: DC

## 2017-10-15 NOTE — Progress Notes (Signed)
Subjective:  I acted as a Education administrator for Dr. Charlett Blake. [place], [place]  Patient ID: Paul Drake, male    DOB: 03-Nov-1945, 72 y.o.   MRN: [id number]  No chief complaint on file.   HPI  Patient is in today for a 3 month follow up and he is doing well. No recent febrile illness or hospitalizations. He does note he had surgery for BPH on 09/05/2017 with Dr Estill Dooms at [facility] and unfortunately he remains uncomfortable with urinary frequency and dysuria. He also notes it is uncomfortable to sit. No polydipsia. Denies CP/palp/SOB/HA/congestion/fevers/GI c/o. Taking meds as prescribed  Patient Care Team: Mosie Lukes, MD as PCP - General (Family Medicine) Rosalin Hawking, MD as Consulting Physician (Neurology) Christy Sartorius, MD as Referring Physician (Urology) Ladene Artist, MD as Consulting Physician (Gastroenterology) Sheryn Bison, MD as Referring Physician (Dermatology)   Past Medical History:  Diagnosis Date  . Allergic state 12/29/2013  . Arthritis   . Barrett's esophagus   . BPH (benign prostatic hyperplasia)   . Chicken pox   . CVA (cerebral infarction)   . Depression   . GERD (gastroesophageal reflux disease)   . HTN (hypertension), benign 04/01/2015  . Hyperglycemia 01/06/2015  . Hyperlipemia   . Hypertension   . IBS (irritable bowel syndrome)   . Kidney stones   . Left hand pain 07/21/2017  . Measles as a child  . Medicare annual wellness visit, subsequent 06/26/2015  . Mumps as a child  . Otitis externa 12/10/2013  . Seasonal allergies    some asthma  . Stroke ([place])   . Unspecified asthma(493.90) 12/29/2013    Past Surgical History:  Procedure Laterality Date  . COLONOSCOPY    . EP IMPLANTABLE DEVICE N/A 10/16/2016   Procedure: Loop Recorder Insertion;  Surgeon: Thompson Grayer, MD;  Location: [facility];  Service: Cardiovascular;  Laterality: N/A;  . LITHOTRIPSY     multiple times  . TEE WITHOUT CARDIOVERSION N/A 09/22/2014   Procedure: TRANSESOPHAGEAL  ECHOCARDIOGRAM (TEE);  Surgeon: Josue Hector, MD;  Location: [facility];  Service: Cardiovascular;  Laterality: N/A;  . WISDOM TOOTH EXTRACTION      Family History  Problem Relation Age of Onset  . Hypertension Mother   . Hyperlipidemia Mother   . Fibromyalgia Mother   . Arthritis Mother        rheumatoid  . Diabetes Sister        type 2  . Hyperlipidemia Brother   . Hypertension Brother   . Ulcers Father 36       Bleeding Ulcers  . Kidney Stones Daughter   . Asthma Daughter   . Healthy Son   . Cancer Maternal Grandfather        skin ?  . Stroke Maternal Aunt   . Cancer Maternal Uncle        prostate    Social History   Socioeconomic History  . Marital status: Married    Spouse name: Webb Silversmith  . Number of children: 2  . Years of education: college  . Highest education level: Not on file  Social Needs  . Financial resource strain: Not on file  . Food insecurity - worry: Not on file  . Food insecurity - inability: Not on file  . Transportation needs - medical: Not on file  . Transportation needs - non-medical: Not on file  Occupational History    Comment: retired  Tobacco Use  . Smoking status: Never Smoker  . Smokeless  tobacco: Never Used  Substance and Sexual Activity  . Alcohol use: Yes    Alcohol/week: 0.6 oz    Types: 1 Shots of liquor per week    Comment: 1-2 occasioinal  . Drug use: No  . 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 Not on file  Social History Narrative   Patient consumes 2 cups of caffeine daily    Outpatient Medications Prior to Visit  Medication Sig Dispense Refill  . albuterol (PROVENTIL HFA;VENTOLIN HFA) 108 (90 BASE) MCG/ACT inhaler Inhale 2 puffs into the lungs every 6 (six) hours as needed for wheezing or shortness of breath. 1 Inhaler 3  . cetirizine (ZYRTEC) 10 MG tablet Take 10 mg by mouth.    . clopidogrel (PLAVIX) 75 MG tablet TAKE 1  TABLET(75 MG) BY MOUTH DAILY 90 tablet 2  . fluticasone (FLONASE) 50 MCG/ACT nasal spray SHAKE WELL AND USE 2 SPRAYS IN EACH NOSTRIL DAILY 16 g 6  . gabapentin (NEURONTIN) 100 MG capsule Take 2 capsules (200 mg total) by mouth 3 (three) times daily. 180 capsule 5  . Multiple Vitamins-Minerals (MULTIVITAMINS THER. W/MINERALS) TABS tablet Take 1 tablet by mouth daily.    . Omega-3 Fatty Acids (OMEGA-3 PO) Take 1 capsule by mouth daily.    Marland Kitchen OVER THE COUNTER MEDICATION Take 1 tablet by mouth 2 (two) times daily. methylation complete supplement    . pantoprazole (PROTONIX) 40 MG tablet Take 1 tablet (40 mg total) by mouth daily. 90 tablet 1  . Simethicone (GAS-X PO) Take 2 tablets by mouth daily as needed (gas).    . citalopram (CELEXA) 20 MG tablet TAKE 1 TABLET BY MOUTH DAILY 30 tablet 0  . doxazosin (CARDURA) 4 MG tablet Take 0.5 tablets (2 mg total) by mouth daily. 15 tablet 11   No facility-administered medications prior to visit.     Allergies  Allergen Reactions  . Pollen Extract Other (See Comments)    Runny nose,itchy eyes    Review of Systems  Constitutional: Negative for fever and malaise/fatigue.  HENT: Negative for congestion.   Eyes: Negative for blurred vision.  Respiratory: Negative for shortness of breath.   Cardiovascular: Negative for chest pain, palpitations and leg swelling.  Gastrointestinal: Negative for abdominal pain, blood in stool and nausea.  Genitourinary: Negative for dysuria and frequency.  Musculoskeletal: Negative for falls.  Skin: Negative for rash.  Neurological: Negative for dizziness, loss of consciousness and headaches.  Endo/Heme/Allergies: Negative for environmental allergies.  Psychiatric/Behavioral: Negative for depression. The patient is not nervous/anxious.        Objective:    Physical Exam  Constitutional: He is oriented to person, place, and time. He appears well-developed and well-nourished. No distress.  HENT:  Head: Normocephalic  and atraumatic.  Nose: Nose normal.  Eyes: Right eye exhibits no discharge. Left eye exhibits no discharge.  Neck: Normal range of motion. Neck supple.  Cardiovascular: Normal rate and regular rhythm.  No murmur heard. Pulmonary/Chest: Effort normal and breath sounds normal.  Abdominal: Soft. Bowel sounds are normal. There is no tenderness.  Musculoskeletal: He exhibits no edema.  Neurological: He is alert and oriented to person, place, and time.  Skin: Skin is warm and dry.  Psychiatric: He has a normal mood and affect.  Nursing note and vitals reviewed.   BP 116/72 (BP Location: Left Arm, Patient Position: Sitting, Cuff Size: Normal)   Pulse (!) 56  Temp 98 F (36.7 C) (Oral)   Resp 18   Wt 162 lb 9.6 oz (73.8 kg)   SpO2 96%   BMI 24.72 kg/m  Wt Readings from Last 3 Encounters:  10/15/17 162 lb 9.6 oz (73.8 kg)  07/29/17 167 lb (75.8 kg)  07/16/17 168 lb (76.2 kg)   BP Readings from Last 3 Encounters:  10/15/17 116/72  07/29/17 105/67  07/16/17 120/62     Immunization History  Administered Date(s) Administered  . Influenza, High Dose Seasonal PF 07/01/2017  . Influenza,inj,Quad PF,6+ Mos 05/17/2014, 05/20/2015, 07/05/2016  . Influenza-Unspecified 06/17/2013  . Pneumococcal Conjugate-13 10/15/2016  . Pneumococcal-Unspecified 06/17/2013  . Tdap 11/11/2013    Health Maintenance  Topic Date Due  . URINE MICROALBUMIN  06/02/1954  . COLONOSCOPY  10/01/2023  . TETANUS/TDAP  11/12/2023  . INFLUENZA VACCINE  Completed  . Hepatitis C Screening  Completed  . PNA vac Low Risk Adult  Completed    Lab Results  Component Value Date   WBC 6.0 10/15/2017   HGB 13.8 10/15/2017   HCT 40.9 10/15/2017   PLT 181.0 10/15/2017   GLUCOSE 109 (H) 10/15/2017   CHOL 205 (H) 10/15/2017   TRIG 102.0 10/15/2017   HDL 64.80 10/15/2017   LDLDIRECT 76.0 12/31/2014   LDLCALC 120 (H) 10/15/2017   ALT 11 10/15/2017   AST 17 10/15/2017   NA 139 10/15/2017   K 4.6 10/15/2017   CL  102 10/15/2017   CREATININE 1.15 10/15/2017   BUN 22 10/15/2017   CO2 31 10/15/2017   TSH 2.12 10/15/2017   INR 0.98 09/20/2014   HGBA1C 5.3 10/15/2017    Lab Results  Component Value Date   TSH 2.12 10/15/2017   Lab Results  Component Value Date   WBC 6.0 10/15/2017   HGB 13.8 10/15/2017   HCT 40.9 10/15/2017   MCV 96.1 10/15/2017   PLT 181.0 10/15/2017   Lab Results  Component Value Date   NA 139 10/15/2017   K 4.6 10/15/2017   CO2 31 10/15/2017   GLUCOSE 109 (H) 10/15/2017   BUN 22 10/15/2017   CREATININE 1.15 10/15/2017   BILITOT 0.9 10/15/2017   ALKPHOS 58 10/15/2017   AST 17 10/15/2017   ALT 11 10/15/2017   PROT 6.8 10/15/2017   ALBUMIN 4.1 10/15/2017   CALCIUM 9.4 10/15/2017   ANIONGAP 7 09/14/2016   GFR 66.19 10/15/2017   Lab Results  Component Value Date   CHOL 205 (H) 10/15/2017   Lab Results  Component Value Date   HDL 64.80 10/15/2017   Lab Results  Component Value Date   LDLCALC 120 (H) 10/15/2017   Lab Results  Component Value Date   TRIG 102.0 10/15/2017   Lab Results  Component Value Date   CHOLHDL 3 10/15/2017   Lab Results  Component Value Date   HGBA1C 5.3 10/15/2017         Assessment & Plan:   Problem List Items Addressed This Visit    BPH (benign prostatic hyperplasia)    Had surgical debridement with Dr Estill Dooms at [facility] on 12/20 and continues to be miserable. Urinary frequency every hour. Stream is better, no hesitancy but some burning is occurring, has trouble getting omfortable in any position. No fevers. Will check labs including urinalsys and culture today      Depression with anxiety    Struggling with increased anhedonia but no suicidal ideation, is worse after surgery and with pain. So will increase the Citalopram 20 mg po  bid and reevaluate in 3 months.       Relevant Medications   citalopram (CELEXA) 20 MG tablet   Essential hypertension    Well controlled, no changes to meds. Encouraged heart healthy diet  such as the DASH diet and exercise as tolerated.       Relevant Orders   Comprehensive metabolic panel (Completed)   CBC (Completed)   TSH (Completed)   Hyperglycemia    hgba1c acceptable, minimize simple carbs. Increase exercise as tolerated.       Relevant Orders   Hemoglobin A1c (Completed)   HLD (hyperlipidemia)    Encouraged heart healthy diet, increase exercise, avoid trans fats, consider a krill oil cap daily      Relevant Orders   Lipid panel (Completed)    Other Visit Diagnoses    Dysuria    -  Primary   Relevant Orders   Urine Culture (Completed)   Urinalysis   Sedimentation rate (Completed)      I have discontinued [name] Leyendecker's doxazosin. I have also changed his citalopram. Additionally, I am having him maintain his multivitamins ther. w/minerals, albuterol, cetirizine, Omega-3 Fatty Acids (OMEGA-3 PO), OVER THE COUNTER MEDICATION, Simethicone (GAS-X PO), clopidogrel, pantoprazole, fluticasone, and gabapentin.  Meds ordered this encounter  Medications  . citalopram (CELEXA) 20 MG tablet    Sig: Take 1 tablet (20 mg total) by mouth 2 (two) times daily.    Dispense:  60 tablet    Refill:  3    CMA served as scribe during this visit. History, Physical and Plan performed by medical provider. Documentation and orders reviewed and attested to.  Penni Homans, MD

## 2017-10-15 NOTE — Assessment & Plan Note (Signed)
Well controlled, no changes to meds. Encouraged heart healthy diet such as the DASH diet and exercise as tolerated.  °

## 2017-10-15 NOTE — Assessment & Plan Note (Signed)
Struggling with increased anhedonia but no suicidal ideation, is worse after surgery and with pain. So will increase the Citalopram 20 mg po bid and reevaluate in 3 months.

## 2017-10-15 NOTE — Assessment & Plan Note (Signed)
Had surgical debridement with Dr Estill Dooms at [facility] on 12/20 and continues to be miserable. Urinary frequency every hour. Stream is better, no hesitancy but some burning is occurring, has trouble getting omfortable in any position. No fevers. Will check labs including urinalsys and culture today

## 2017-10-15 NOTE — Patient Instructions (Addendum)
Shingrix is the new shingles shot can get at pharmacy, 2 shots over 2-6 months  Carbohydrate Counting for Diabetes Mellitus, Adult Carbohydrate counting is a method for keeping track of how many carbohydrates you eat. Eating carbohydrates naturally increases the amount of sugar (glucose) in the blood. Counting how many carbohydrates you eat helps keep your blood glucose within normal limits, which helps you manage your diabetes (diabetes mellitus). It is important to know how many carbohydrates you can safely have in each meal. This is different for every person. A diet and nutrition specialist (registered dietitian) can help you make a meal plan and calculate how many carbohydrates you should have at each meal and snack. Carbohydrates are found in the following foods:  Grains, such as breads and cereals.  Dried beans and soy products.  Starchy vegetables, such as potatoes, peas, and corn.  Fruit and fruit juices.  Milk and yogurt.  Sweets and snack foods, such as cake, cookies, candy, chips, and soft drinks.  How do I count carbohydrates? There are two ways to count carbohydrates in food. You can use either of the methods or a combination of both. Reading "Nutrition Facts" on packaged food The "Nutrition Facts" list is included on the labels of almost all packaged foods and beverages in the [place] It includes:  The serving size.  Information about nutrients in each serving, including the grams (g) of carbohydrate per serving.  To use the "Nutrition Facts":  Decide how many servings you will have.  Multiply the number of servings by the number of carbohydrates per serving.  The resulting number is the total amount of carbohydrates that you will be having.  Learning standard serving sizes of other foods When you eat foods containing carbohydrates that are not packaged or do not include "Nutrition Facts" on the label, you need to measure the servings in order to count the amount of  carbohydrates:  Measure the foods that you will eat with a food scale or measuring cup, if needed.  Decide how many standard-size servings you will eat.  Multiply the number of servings by 15. Most carbohydrate-rich foods have about 15 g of carbohydrates per serving. ? For example, if you eat 8 oz (170 g) of strawberries, you will have eaten 2 servings and 30 g of carbohydrates (2 servings x 15 g = 30 g).  For foods that have more than one food mixed, such as soups and casseroles, you must count the carbohydrates in each food that is included.  The following list contains standard serving sizes of common carbohydrate-rich foods. Each of these servings has about 15 g of carbohydrates:   hamburger bun or  English muffin.   oz (15 mL) syrup.   oz (14 g) jelly.  1 slice of bread.  1 six-inch tortilla.  3 oz (85 g) cooked rice or pasta.  4 oz (113 g) cooked dried beans.  4 oz (113 g) starchy vegetable, such as peas, corn, or potatoes.  4 oz (113 g) hot cereal.  4 oz (113 g) mashed potatoes or  of a large baked potato.  4 oz (113 g) canned or frozen fruit.  4 oz (120 mL) fruit juice.  4-6 crackers.  6 chicken nuggets.  6 oz (170 g) unsweetened dry cereal.  6 oz (170 g) plain fat-free yogurt or yogurt sweetened with artificial sweeteners.  8 oz (240 mL) milk.  8 oz (170 g) fresh fruit or one small piece of fruit.  24 oz (680 g)  popped popcorn.  Example of carbohydrate counting Sample meal  3 oz (85 g) chicken breast.  6 oz (170 g) brown rice.  4 oz (113 g) corn.  8 oz (240 mL) milk.  8 oz (170 g) strawberries with sugar-free whipped topping. Carbohydrate calculation 1. Identify the foods that contain carbohydrates: ? Rice. ? Corn. ? Milk. ? Strawberries. 2. Calculate how many servings you have of each food: ? 2 servings rice. ? 1 serving corn. ? 1 serving milk. ? 1 serving strawberries. 3. Multiply each number of servings by 15 g: ? 2 servings  rice x 15 g = 30 g. ? 1 serving corn x 15 g = 15 g. ? 1 serving milk x 15 g = 15 g. ? 1 serving strawberries x 15 g = 15 g. 4. Add together all of the amounts to find the total grams of carbohydrates eaten: ? 30 g + 15 g + 15 g + 15 g = 75 g of carbohydrates total. This information is not intended to replace advice given to you by your health care provider. Make sure you discuss any questions you have with your health care provider. Document Released: 09/03/2005 Document Revised: 03/23/2016 Document Reviewed: 02/15/2016 Elsevier Interactive Patient Education  [employer].

## 2017-10-15 NOTE — Assessment & Plan Note (Signed)
hgba1c acceptable, minimize simple carbs. Increase exercise as tolerated.  

## 2017-10-15 NOTE — Assessment & Plan Note (Signed)
Encouraged heart healthy diet, increase exercise, avoid trans fats, consider a krill oil cap daily 

## 2017-10-16 LAB — URINE CULTURE
MICRO NUMBER: 90122910
Result:: NO GROWTH
SPECIMEN QUALITY:: ADEQUATE

## 2017-10-18 LAB — CUP PACEART REMOTE DEVICE CHECK
Date Time Interrogation Session: 20190125210924
MDC IDC PG IMPLANT DT: 20180130

## 2017-10-28 ENCOUNTER — Other Ambulatory Visit: Payer: Self-pay | Admitting: Family Medicine

## 2017-10-28 ENCOUNTER — Ambulatory Visit: Payer: Self-pay | Admitting: *Deleted

## 2017-10-28 NOTE — Telephone Encounter (Signed)
Pt called back regarding symptoms and need for albuterol inhaler; see nurse triage note.

## 2017-10-28 NOTE — Telephone Encounter (Signed)
Patient called [phone], left VM to call back to discuss symptoms relating to request for albuterol nebulizer.

## 2017-10-28 NOTE — Telephone Encounter (Signed)
Copied from [place]. Topic: Quick Communication - Rx Refill/Question >> Oct 28, 2017  9:49 AM Ahmed Prima L wrote: Medication:  albuterol (PROVENTIL) (2.5 MG/3ML) 0.083% nebulizer solution 2.5 mg   [[id number]]  Has the patient contacted their pharmacy? Yes & it is expired   (Agent: If no, request that the patient contact the pharmacy for the refill.)   Preferred Pharmacy (with phone number or street name): [facility] [phone] - [place], [place] - [address]   Agent: Please be advised that RX refills may take up to 3 business days. We ask that you follow-up with your pharmacy.

## 2017-10-28 NOTE — Telephone Encounter (Addendum)
See Pt called back stating that he has a productive cough with thick yellow sputum, and he is unable to sleep at night; he also complains of chest tightness and wheezlng when he coughs; pt states that his wife would like for him to get an inhaler; nurse triage initiated and recommendation made that pt see physician within 24 hrs; pt would like to be seen by Dr Charlett Blake; pt offered and accepted appointment with Dr Charlett Blake 10/29/17 at 0945; pt verbalizes understanding.     Reason for Disposition . [1] Known COPD or other severe lung disease (i.e., bronchiectasis, cystic fibrosis, lung surgery) AND [2] worsening symptoms (i.e., increased sputum purulence or amount, increased breathing difficulty  Answer Assessment - Initial Assessment Questions 1. ONSET: "When did the cough begin?"      Thursday 10/24/17 2. SEVERITY: "How bad is the cough today?"    Severe; worse when laying down 3. RESPIRATORY DISTRESS: "Describe your breathing."      no 4. FEVER: "Do you have a fever?" If so, ask: "What is your temperature, how was it measured, and when did it start?"     no 5. SPUTUM: "Describe the color of your sputum" (clear, white, yellow, green)     Thick yellow 6. HEMOPTYSIS: "Are you coughing up any blood?" If so ask: "How much?" (flecks, streaks, tablespoons, etc.)     no 7. CARDIAC HISTORY: "Do you have any history of heart disease?" (e.g., heart attack, congestive heart failure)      no 8. LUNG HISTORY: "Do you have any history of lung disease?"  (e.g., pulmonary embolus, asthma, emphysema)     Asthma due to allergies 9. PE RISK FACTORS: "Do you have a history of blood clots?" (or: recent major surgery, recent prolonged travel, bedridden )     History of blood clots (CVA now on blood thinners) 10. OTHER SYMPTOMS: "Do you have any other symptoms?" (e.g., runny nose, wheezing, chest pain)       Wheezing when coughing, runny nose, sneezing, drainage in back of throat 11. PREGNANCY: "Is there any chance you are  pregnant?" "When was your last menstrual period?"       n/a 12. TRAVEL: "Have you traveled out of the country in the last month?" (e.g., travel history, exposures)       no  Protocols used: [place]

## 2017-10-29 ENCOUNTER — Ambulatory Visit (INDEPENDENT_AMBULATORY_CARE_PROVIDER_SITE_OTHER): Payer: Medicare Other | Admitting: Family Medicine

## 2017-10-29 ENCOUNTER — Encounter: Payer: Self-pay | Admitting: Family Medicine

## 2017-10-29 DIAGNOSIS — I10 Essential (primary) hypertension: Secondary | ICD-10-CM

## 2017-10-29 DIAGNOSIS — I639 Cerebral infarction, unspecified: Secondary | ICD-10-CM | POA: Diagnosis not present

## 2017-10-29 DIAGNOSIS — J40 Bronchitis, not specified as acute or chronic: Secondary | ICD-10-CM | POA: Diagnosis not present

## 2017-10-29 DIAGNOSIS — J45909 Unspecified asthma, uncomplicated: Secondary | ICD-10-CM

## 2017-10-29 MED ORDER — ALBUTEROL SULFATE HFA 108 (90 BASE) MCG/ACT IN AERS: 2.0000 | INHALATION_SPRAY | Freq: Four times a day (QID) | RESPIRATORY_TRACT | 3 refills | Status: DC | PRN

## 2017-10-29 MED ORDER — METHYLPREDNISOLONE 4 MG PO TABS: ORAL_TABLET | ORAL | 0 refills | Status: DC

## 2017-10-29 MED ORDER — HYDROCODONE-HOMATROPINE 5-1.5 MG/5ML PO SYRP: 5.0000 mL | ORAL_SOLUTION | Freq: Every evening | ORAL | 0 refills | Status: DC | PRN

## 2017-10-29 MED ORDER — CEFDINIR 300 MG PO CAPS: 300.0000 mg | ORAL_CAPSULE | Freq: Two times a day (BID) | ORAL | 0 refills | Status: AC

## 2017-10-29 NOTE — Progress Notes (Signed)
Dictation #1 [id number]  [id number]   Subjective:  I acted as a Education administrator for Dr. Charlett Blake. [place], [place]  Patient ID: Paul Drake, male    DOB: 25-Feb-1946, 72 y.o.   MRN: [id number]  No chief complaint on file.   HPI  Patient is in today for an acute visit for a cough and drainage for the past week or two. No obvious fevers or chills but is noting congestion and cough keeping him up at night. No ear or throat pain. Does note fatigue and and myalgias. No CP/palp/fevers/GI or GU c/o. Taking meds as prescribed  Patient Care Team: Mosie Lukes, MD as PCP - General (Family Medicine) Rosalin Hawking, MD as Consulting Physician (Neurology) Christy Sartorius, MD as Referring Physician (Urology) Ladene Artist, MD as Consulting Physician (Gastroenterology) Sheryn Bison, MD as Referring Physician (Dermatology)   Past Medical History:  Diagnosis Date  . Allergic state 12/29/2013  . Arthritis   . Barrett's esophagus   . BPH (benign prostatic hyperplasia)   . Chicken pox   . CVA (cerebral infarction)   . Depression   . GERD (gastroesophageal reflux disease)   . HTN (hypertension), benign 04/01/2015  . Hyperglycemia 01/06/2015  . Hyperlipemia   . Hypertension   . IBS (irritable bowel syndrome)   . Kidney stones   . Left hand pain 07/21/2017  . Measles as a child  . Medicare annual wellness visit, subsequent 06/26/2015  . Mumps as a child  . Otitis externa 12/10/2013  . Seasonal allergies    some asthma  . Stroke ([place])   . Unspecified asthma(493.90) 12/29/2013    Past Surgical History:  Procedure Laterality Date  . COLONOSCOPY    . EP IMPLANTABLE DEVICE N/A 10/16/2016   Procedure: Loop Recorder Insertion;  Surgeon: Thompson Grayer, MD;  Location: [place] [facility];  Service: Cardiovascular;  Laterality: N/A;  . LITHOTRIPSY     multiple times  . TEE WITHOUT CARDIOVERSION N/A 09/22/2014   Procedure: TRANSESOPHAGEAL ECHOCARDIOGRAM (TEE);  Surgeon: Josue Hector, MD;  [facility]  ENDOSCOPY;  Service: Cardiovascular;  Laterality: N/A;  . WISDOM TOOTH EXTRACTION      Family History  Problem Relation Age of Onset  . Hypertension Mother   . Hyperlipidemia Mother   . Fibromyalgia Mother   . Arthritis Mother        rheumatoid  . Diabetes Sister        type 2  . Hyperlipidemia Brother   . Hypertension Brother   . Ulcers Father 36       Bleeding Ulcers  . Kidney Stones Daughter   . Asthma Daughter   . Healthy Son   . Cancer Maternal Grandfather        skin ?  . Stroke Maternal Aunt   . Cancer Maternal Uncle        prostate    Social History   Socioeconomic History  . Marital status: Married    Spouse name: Webb Silversmith  . Number of children: 2  . Years of education: college  . Highest education level: Not on file  Social Needs  . Financial resource strain: Not on file  . Food insecurity - worry: Not on file  . Food insecurity - inability: Not on file  . Transportation needs - medical: Not on file  . Transportation needs - non-medical: Not on file  Occupational History    Comment: retired  Tobacco Use  . Smoking status: Never Smoker  . Smokeless tobacco: Never  Used  Substance and Sexual Activity  . Alcohol use: Yes    Alcohol/week: 0.6 oz    Types: 1 Shots of liquor per week    Comment: 1-2 occasioinal  . Drug use: No  . 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 Not on file  Social History Narrative   Patient consumes 2 cups of caffeine daily    Outpatient Medications Prior to Visit  Medication Sig Dispense Refill  . cetirizine (ZYRTEC) 10 MG tablet Take 10 mg by mouth.    . citalopram (CELEXA) 20 MG tablet Take 1 tablet (20 mg total) by mouth 2 (two) times daily. 60 tablet 3  . clopidogrel (PLAVIX) 75 MG tablet TAKE 1 TABLET(75 MG) BY MOUTH DAILY 90 tablet 2  . fluticasone (FLONASE) 50 MCG/ACT nasal spray SHAKE WELL AND USE 2 SPRAYS IN EACH NOSTRIL DAILY 16 g 6    . gabapentin (NEURONTIN) 100 MG capsule Take 2 capsules (200 mg total) by mouth 3 (three) times daily. 180 capsule 5  . Multiple Vitamins-Minerals (MULTIVITAMINS THER. W/MINERALS) TABS tablet Take 1 tablet by mouth daily.    . Omega-3 Fatty Acids (OMEGA-3 PO) Take 1 capsule by mouth daily.    Marland Kitchen OVER THE COUNTER MEDICATION Take 1 tablet by mouth 2 (two) times daily. methylation complete supplement    . pantoprazole (PROTONIX) 40 MG tablet Take 1 tablet (40 mg total) by mouth daily. 90 tablet 1  . Simethicone (GAS-X PO) Take 2 tablets by mouth daily as needed (gas).    Marland Kitchen albuterol (PROVENTIL HFA;VENTOLIN HFA) 108 (90 BASE) MCG/ACT inhaler Inhale 2 puffs into the lungs every 6 (six) hours as needed for wheezing or shortness of breath. 1 Inhaler 3   No facility-administered medications prior to visit.     Allergies  Allergen Reactions  . Pollen Extract Other (See Comments)    Runny nose,itchy eyes    Review of Systems  Constitutional: Positive for malaise/fatigue. Negative for fever.  HENT: Negative for congestion.   Eyes: Negative for blurred vision.  Respiratory: Positive for cough, sputum production and shortness of breath.   Cardiovascular: Negative for chest pain, palpitations and leg swelling.  Gastrointestinal: Negative for abdominal pain, blood in stool and nausea.  Genitourinary: Negative for dysuria and frequency.  Musculoskeletal: Negative for falls.  Skin: Negative for rash.  Neurological: Positive for headaches. Negative for dizziness and loss of consciousness.  Endo/Heme/Allergies: Negative for environmental allergies.  Psychiatric/Behavioral: Negative for depression. The patient is not nervous/anxious.        Objective:    Physical Exam  Constitutional: He is oriented to person, place, and time. He appears well-developed and well-nourished. No distress.  HENT:  Head: Normocephalic and atraumatic.  Nose: Nose normal.  Eyes: Right eye exhibits no discharge. Left eye  exhibits no discharge.  Neck: Normal range of motion. Neck supple.  Cardiovascular: Normal rate and regular rhythm.  No murmur heard. Pulmonary/Chest: Effort normal and breath sounds normal.  Abdominal: Soft. Bowel sounds are normal. There is no tenderness.  Musculoskeletal: He exhibits no edema.  Neurological: He is alert and oriented to person, place, and time.  Skin: Skin is warm and dry.  Psychiatric: He has a normal mood and affect.  Nursing note and vitals reviewed.   BP 124/80 (BP Location: Left Arm, Patient Position: Sitting, Cuff Size: Normal)   Pulse 62   Temp 98.1 F (36.7 C) (Oral)  Resp 18   Wt 164 lb 9.6 oz (74.7 kg)   SpO2 97%   BMI 25.03 kg/m  Wt Readings from Last 3 Encounters:  10/29/17 164 lb 9.6 oz (74.7 kg)  10/15/17 162 lb 9.6 oz (73.8 kg)  07/29/17 167 lb (75.8 kg)   BP Readings from Last 3 Encounters:  10/29/17 124/80  10/15/17 116/72  07/29/17 105/67     Immunization History  Administered Date(s) Administered  . Influenza, High Dose Seasonal PF 07/01/2017  . Influenza,inj,Quad PF,6+ Mos 05/17/2014, 05/20/2015, 07/05/2016  . Influenza-Unspecified 06/17/2013  . Pneumococcal Conjugate-13 10/15/2016  . Pneumococcal-Unspecified 06/17/2013  . Tdap 11/11/2013    Health Maintenance  Topic Date Due  . COLONOSCOPY  10/01/2023  . TETANUS/TDAP  11/12/2023  . INFLUENZA VACCINE  Completed  . Hepatitis C Screening  Completed  . PNA vac Low Risk Adult  Completed    Lab Results  Component Value Date   WBC 6.0 10/15/2017   HGB 13.8 10/15/2017   HCT 40.9 10/15/2017   PLT 181.0 10/15/2017   GLUCOSE 109 (H) 10/15/2017   CHOL 205 (H) 10/15/2017   TRIG 102.0 10/15/2017   HDL 64.80 10/15/2017   LDLDIRECT 76.0 12/31/2014   LDLCALC 120 (H) 10/15/2017   ALT 11 10/15/2017   AST 17 10/15/2017   NA 139 10/15/2017   K 4.6 10/15/2017   CL 102 10/15/2017   CREATININE 1.15 10/15/2017   BUN 22 10/15/2017   CO2 31 10/15/2017   TSH 2.12 10/15/2017   INR  0.98 09/20/2014   HGBA1C 5.3 10/15/2017    Lab Results  Component Value Date   TSH 2.12 10/15/2017   Lab Results  Component Value Date   WBC 6.0 10/15/2017   HGB 13.8 10/15/2017   HCT 40.9 10/15/2017   MCV 96.1 10/15/2017   PLT 181.0 10/15/2017   Lab Results  Component Value Date   NA 139 10/15/2017   K 4.6 10/15/2017   CO2 31 10/15/2017   GLUCOSE 109 (H) 10/15/2017   BUN 22 10/15/2017   CREATININE 1.15 10/15/2017   BILITOT 0.9 10/15/2017   ALKPHOS 58 10/15/2017   AST 17 10/15/2017   ALT 11 10/15/2017   PROT 6.8 10/15/2017   ALBUMIN 4.1 10/15/2017   CALCIUM 9.4 10/15/2017   ANIONGAP 7 09/14/2016   GFR 66.19 10/15/2017   Lab Results  Component Value Date   CHOL 205 (H) 10/15/2017   Lab Results  Component Value Date   HDL 64.80 10/15/2017   Lab Results  Component Value Date   LDLCALC 120 (H) 10/15/2017   Lab Results  Component Value Date   TRIG 102.0 10/15/2017   Lab Results  Component Value Date   CHOLHDL 3 10/15/2017   Lab Results  Component Value Date   HGBA1C 5.3 10/15/2017         Assessment & Plan:   Problem List Items Addressed This Visit    Essential hypertension    Well controlled, no changes to meds. Encouraged heart healthy diet such as the DASH diet and exercise as tolerated.       Bronchitis    Has been sick for past couple of weeks. Encouraged increased rest and hydration, add probiotics, zinc such as Coldeze or Xicam. Treat fevers as needed, vitamin C and elderberry, if no improvement is given Hydromet to use qhs prn and a medrol dosepak to use and if no improvement then can take a course of Cefdinir      Asthma  Given refill on Albuterol to use prn      Relevant Medications   albuterol (PROVENTIL HFA;VENTOLIN HFA) 108 (90 Base) MCG/ACT inhaler   methylPREDNISolone (MEDROL) 4 MG tablet      I am having [name] start on methylPREDNISolone, HYDROcodone-homatropine, and cefdinir. I am also having him maintain his  multivitamins ther. w/minerals, cetirizine, Omega-3 Fatty Acids (OMEGA-3 PO), OVER THE COUNTER MEDICATION, Simethicone (GAS-X PO), clopidogrel, pantoprazole, fluticasone, gabapentin, citalopram, and albuterol.  Meds ordered this encounter  Medications  . albuterol (PROVENTIL HFA;VENTOLIN HFA) 108 (90 Base) MCG/ACT inhaler    Sig: Inhale 2 puffs into the lungs every 6 (six) hours as needed for wheezing or shortness of breath.    Dispense:  1 Inhaler    Refill:  3  . methylPREDNISolone (MEDROL) 4 MG tablet    Sig: 5 tab po qd X 1d then 4 tab po qd X 1d then 3 tab po qd X 1d then 2 tab po qd then 1 tab po qd    Dispense:  15 tablet    Refill:  0  . HYDROcodone-homatropine (HYCODAN) 5-1.5 MG/5ML syrup    Sig: Take 5 mLs by mouth at bedtime as needed for cough.    Dispense:  120 mL    Refill:  0  . cefdinir (OMNICEF) 300 MG capsule    Sig: Take 1 capsule (300 mg total) by mouth 2 (two) times daily for 10 days.    Dispense:  20 capsule    Refill:  0    CMA served as scribe during this visit. History, Physical and Plan performed by medical provider. Documentation and orders reviewed and attested to.  Penni Homans, MD

## 2017-10-29 NOTE — Patient Instructions (Signed)

## 2017-10-30 DIAGNOSIS — J45909 Unspecified asthma, uncomplicated: Secondary | ICD-10-CM | POA: Insufficient documentation

## 2017-10-30 DIAGNOSIS — J40 Bronchitis, not specified as acute or chronic: Secondary | ICD-10-CM | POA: Insufficient documentation

## 2017-10-30 NOTE — Assessment & Plan Note (Signed)
Given refill on Albuterol to use prn 

## 2017-10-30 NOTE — Assessment & Plan Note (Signed)
Well controlled, no changes to meds. Encouraged heart healthy diet such as the DASH diet and exercise as tolerated.  °

## 2017-10-30 NOTE — Assessment & Plan Note (Signed)
Has been sick for past couple of weeks. Encouraged increased rest and hydration, add probiotics, zinc such as Coldeze or Xicam. Treat fevers as needed, vitamin C and elderberry, if no improvement is given Hydromet to use qhs prn and a medrol dosepak to use and if no improvement then can take a course of Cefdinir

## 2017-11-13 ENCOUNTER — Ambulatory Visit (INDEPENDENT_AMBULATORY_CARE_PROVIDER_SITE_OTHER): Payer: Medicare Other | Admitting: *Deleted

## 2017-11-13 DIAGNOSIS — I639 Cerebral infarction, unspecified: Secondary | ICD-10-CM | POA: Diagnosis not present

## 2017-11-14 NOTE — Progress Notes (Signed)
Carelink Summary Report / Loop Recorder 

## 2017-12-13 ENCOUNTER — Other Ambulatory Visit: Payer: Self-pay | Admitting: Family Medicine

## 2017-12-16 ENCOUNTER — Ambulatory Visit (INDEPENDENT_AMBULATORY_CARE_PROVIDER_SITE_OTHER): Payer: Medicare Other | Admitting: *Deleted

## 2017-12-16 DIAGNOSIS — I639 Cerebral infarction, unspecified: Secondary | ICD-10-CM | POA: Diagnosis not present

## 2017-12-17 NOTE — Progress Notes (Signed)
Carelink Summary Reprot / Loop Recorder 

## 2017-12-18 LAB — CUP PACEART REMOTE DEVICE CHECK
MDC IDC PG IMPLANT DT: 20180130
MDC IDC SESS DTM: 20190227230949

## 2017-12-23 DIAGNOSIS — N401 Enlarged prostate with lower urinary tract symptoms: Secondary | ICD-10-CM | POA: Diagnosis not present

## 2017-12-23 DIAGNOSIS — N5201 Erectile dysfunction due to arterial insufficiency: Secondary | ICD-10-CM | POA: Diagnosis not present

## 2017-12-23 DIAGNOSIS — N138 Other obstructive and reflux uropathy: Secondary | ICD-10-CM | POA: Diagnosis not present

## 2017-12-26 ENCOUNTER — Other Ambulatory Visit: Payer: Self-pay | Admitting: Family Medicine

## 2018-01-14 ENCOUNTER — Encounter: Payer: Self-pay | Admitting: Family Medicine

## 2018-01-14 ENCOUNTER — Ambulatory Visit (INDEPENDENT_AMBULATORY_CARE_PROVIDER_SITE_OTHER): Payer: Medicare Other | Admitting: Family Medicine

## 2018-01-14 VITALS — BP 132/72 | HR 55 | Temp 98.1°F | Resp 16 | Ht 68.11 in | Wt 165.6 lb

## 2018-01-14 DIAGNOSIS — I639 Cerebral infarction, unspecified: Secondary | ICD-10-CM | POA: Diagnosis not present

## 2018-01-14 DIAGNOSIS — G450 Vertebro-basilar artery syndrome: Secondary | ICD-10-CM

## 2018-01-14 DIAGNOSIS — Z8673 Personal history of transient ischemic attack (TIA), and cerebral infarction without residual deficits: Secondary | ICD-10-CM | POA: Diagnosis not present

## 2018-01-14 DIAGNOSIS — R739 Hyperglycemia, unspecified: Secondary | ICD-10-CM

## 2018-01-14 DIAGNOSIS — I10 Essential (primary) hypertension: Secondary | ICD-10-CM

## 2018-01-14 DIAGNOSIS — E7801 Familial hypercholesterolemia: Secondary | ICD-10-CM

## 2018-01-14 DIAGNOSIS — G479 Sleep disorder, unspecified: Secondary | ICD-10-CM | POA: Diagnosis not present

## 2018-01-14 MED ORDER — LOSARTAN POTASSIUM 100 MG PO TABS: 100.0000 mg | ORAL_TABLET | Freq: Every day | ORAL | 1 refills | Status: DC

## 2018-01-14 MED ORDER — SIMVASTATIN 20 MG PO TABS: ORAL_TABLET | ORAL | 1 refills | Status: DC

## 2018-01-14 NOTE — Patient Instructions (Signed)

## 2018-01-14 NOTE — Progress Notes (Signed)
Subjective:  I acted as a Education administrator for [employer]. Paul Drake, [place]   Patient ID: Paul Drake, male    DOB: September 19, 1945, 72 y.o.   MRN: [id number]  No chief complaint on file.   HPI  Patient is in today for follow up visit. Overall he feels well. No recent febrile illness or hospitalizations. No poluria or polydipsa. Feels well and is trying to maintain a heart healthy diet. No acute concerns. Needs a referral to a new neurologist since his neurologist is retiring. Denies CP/palp/SOB/HA/congestion/fevers/GI or GU c/o. Taking meds as prescribed  Patient Care Team: Mosie Lukes, MD as PCP - General (Family Medicine) Rosalin Hawking, MD as Consulting Physician (Neurology) Christy Sartorius, MD as Referring Physician (Urology) Ladene Artist, MD as Consulting Physician (Gastroenterology) Sheryn Bison, MD as Referring Physician (Dermatology)   Past Medical History:  Diagnosis Date  . Allergic state 12/29/2013  . Arthritis   . Barrett's esophagus   . BPH (benign prostatic hyperplasia)   . Chicken pox   . CVA (cerebral infarction)   . Depression   . GERD (gastroesophageal reflux disease)   . HTN (hypertension), benign 04/01/2015  . Hyperglycemia 01/06/2015  . Hyperlipemia   . Hypertension   . IBS (irritable bowel syndrome)   . Kidney stones   . Left hand pain 07/21/2017  . Measles as a child  . Medicare annual wellness visit, subsequent 06/26/2015  . Mumps as a child  . Otitis externa 12/10/2013  . Seasonal allergies    some asthma  . Stroke ([place])   . Unspecified asthma(493.90) 12/29/2013    Past Surgical History:  Procedure Laterality Date  . COLONOSCOPY    . EP IMPLANTABLE DEVICE N/A 10/16/2016   Procedure: Loop Recorder Insertion;  Surgeon: Thompson Grayer, MD;  Location: [place] [facility];  Service: Cardiovascular;  Laterality: N/A;  . LITHOTRIPSY     multiple times  . TEE WITHOUT CARDIOVERSION N/A 09/22/2014   Procedure: TRANSESOPHAGEAL ECHOCARDIOGRAM (TEE);  Surgeon: Josue Hector, MD;  Location: [facility] ENDOSCOPY;  Service: Cardiovascular;  Laterality: N/A;  . WISDOM TOOTH EXTRACTION      Family History  Problem Relation Age of Onset  . Hypertension Mother   . Hyperlipidemia Mother   . Fibromyalgia Mother   . Arthritis Mother        rheumatoid  . Diabetes Sister        type 2  . Hyperlipidemia Brother   . Hypertension Brother   . Ulcers Father 36       Bleeding Ulcers  . Kidney Stones Daughter   . Asthma Daughter   . Healthy Son   . Cancer Maternal Grandfather        skin ?  . Stroke Maternal Aunt   . Cancer Maternal Uncle        prostate    Social History   Socioeconomic History  . Marital status: Married    Spouse name: Webb Silversmith  . Number of children: 2  . Years of education: college  . Highest education level: Not on file  Occupational History    Comment: retired  Scientific laboratory technician  . Financial resource strain: Not on file  . Food insecurity:    Worry: Not on file    Inability: Not on file  . Transportation needs:    Medical: Not on file    Non-medical: Not on file  Tobacco Use  . Smoking status: Never Smoker  . Smokeless tobacco: Never Used  Substance and  Sexual Activity  . Alcohol use: Yes    Alcohol/week: 0.6 oz    Types: 1 Shots of liquor per week    Comment: 1-2 occasioinal  . Drug use: No  . 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 Physical activity:    Days per week: Not on file    Minutes per session: Not on file  . Stress: Not on file  Relationships  . Social connections:    Talks on phone: Not on file    Gets together: Not on file    Attends religious service: Not on file    Active member of club or organization: Not on file    Attends meetings of clubs or organizations: Not on file    Relationship status: Not on file  . Intimate partner violence:    Fear of current or ex partner: Not on file    Emotionally abused: Not on file    Physically  abused: Not on file    Forced sexual activity: Not on file  Other Topics Concern  . Not on file  Social History Narrative   Patient consumes 2 cups of caffeine daily    Outpatient Medications Prior to Visit  Medication Sig Dispense Refill  . albuterol (PROVENTIL HFA;VENTOLIN HFA) 108 (90 Base) MCG/ACT inhaler Inhale 2 puffs into the lungs every 6 (six) hours as needed for wheezing or shortness of breath. 1 Inhaler 3  . cetirizine (ZYRTEC) 10 MG tablet Take 10 mg by mouth.    . citalopram (CELEXA) 20 MG tablet TAKE 1 TABLET(20 MG) BY MOUTH TWICE DAILY 180 tablet 3  . clopidogrel (PLAVIX) 75 MG tablet TAKE 1 TABLET(75 MG) BY MOUTH DAILY 90 tablet 2  . fluticasone (FLONASE) 50 MCG/ACT nasal spray SHAKE WELL AND USE 2 SPRAYS IN EACH NOSTRIL DAILY 16 g 6  . gabapentin (NEURONTIN) 100 MG capsule Take 2 capsules (200 mg total) by mouth 3 (three) times daily. 180 capsule 5  . Multiple Vitamins-Minerals (MULTIVITAMINS THER. W/MINERALS) TABS tablet Take 1 tablet by mouth daily.    . Omega-3 Fatty Acids (OMEGA-3 PO) Take 1 capsule by mouth daily.    Marland Kitchen OVER THE COUNTER MEDICATION Take 1 tablet by mouth 2 (two) times daily. methylation complete supplement    . pantoprazole (PROTONIX) 40 MG tablet TAKE 1 TABLET(40 MG) BY MOUTH DAILY 90 tablet 0  . Simethicone (GAS-X PO) Take 2 tablets by mouth daily as needed (gas).    Marland Kitchen HYDROcodone-homatropine (HYCODAN) 5-1.5 MG/5ML syrup Take 5 mLs by mouth at bedtime as needed for cough. (Patient not taking: Reported on 01/14/2018) 120 mL 0  . methylPREDNISolone (MEDROL) 4 MG tablet 5 tab po qd X 1d then 4 tab po qd X 1d then 3 tab po qd X 1d then 2 tab po qd then 1 tab po qd (Patient not taking: Reported on 01/14/2018) 15 tablet 0   No facility-administered medications prior to visit.     Allergies  Allergen Reactions  . Pollen Extract Other (See Comments)    Runny nose,itchy eyes    Review of Systems  Constitutional: Negative for fever and malaise/fatigue.    HENT: Negative for congestion.   Eyes: Negative for blurred vision.  Respiratory: Negative for shortness of breath.   Cardiovascular: Negative for chest pain, palpitations and leg swelling.  Gastrointestinal: Negative for abdominal pain, blood in stool and nausea.  Genitourinary: Negative for dysuria and frequency.  Musculoskeletal:  Negative for falls.  Skin: Negative for rash.  Neurological: Negative for dizziness, loss of consciousness and headaches.  Endo/Heme/Allergies: Negative for environmental allergies.  Psychiatric/Behavioral: Negative for depression. The patient is not nervous/anxious.        Objective:    Physical Exam  Constitutional: He is oriented to person, place, and time. He appears well-developed and well-nourished. No distress.  HENT:  Head: Normocephalic and atraumatic.  Nose: Nose normal.  Eyes: Right eye exhibits no discharge. Left eye exhibits no discharge.  Neck: Normal range of motion. Neck supple.  Cardiovascular: Normal rate and regular rhythm.  No murmur heard. Pulmonary/Chest: Effort normal and breath sounds normal.  Abdominal: Soft. Bowel sounds are normal. There is no tenderness.  Musculoskeletal: He exhibits no edema.  Neurological: He is alert and oriented to person, place, and time.  Skin: Skin is warm and dry.  Psychiatric: He has a normal mood and affect.  Nursing note and vitals reviewed.   BP 132/72   Pulse (!) 55   Temp 98.1 F (36.7 C) (Oral)   Resp 16   Ht 5' 8.11" (1.73 m)   Wt 165 lb 9.6 oz (75.1 kg)   SpO2 100%   BMI 25.10 kg/m  Wt Readings from Last 3 Encounters:  01/14/18 165 lb 9.6 oz (75.1 kg)  10/29/17 164 lb 9.6 oz (74.7 kg)  10/15/17 162 lb 9.6 oz (73.8 kg)   BP Readings from Last 3 Encounters:  01/14/18 132/72  10/29/17 124/80  10/15/17 116/72     Immunization History  Administered Date(s) Administered  . Influenza, High Dose Seasonal PF 07/01/2017  . Influenza,inj,Quad PF,6+ Mos 05/17/2014, 05/20/2015,  07/05/2016  . Influenza-Unspecified 06/17/2013  . Pneumococcal Conjugate-13 10/15/2016  . Pneumococcal-Unspecified 06/17/2013  . Tdap 11/11/2013    Health Maintenance  Topic Date Due  . INFLUENZA VACCINE  04/17/2018  . COLONOSCOPY  10/01/2023  . TETANUS/TDAP  11/12/2023  . Hepatitis C Screening  Completed  . PNA vac Low Risk Adult  Completed    Lab Results  Component Value Date   WBC 6.0 10/15/2017   HGB 13.8 10/15/2017   HCT 40.9 10/15/2017   PLT 181.0 10/15/2017   GLUCOSE 109 (H) 10/15/2017   CHOL 205 (H) 10/15/2017   TRIG 102.0 10/15/2017   HDL 64.80 10/15/2017   LDLDIRECT 76.0 12/31/2014   LDLCALC 120 (H) 10/15/2017   ALT 11 10/15/2017   AST 17 10/15/2017   NA 139 10/15/2017   K 4.6 10/15/2017   CL 102 10/15/2017   CREATININE 1.15 10/15/2017   BUN 22 10/15/2017   CO2 31 10/15/2017   TSH 2.12 10/15/2017   INR 0.98 09/20/2014   HGBA1C 5.3 10/15/2017    Lab Results  Component Value Date   TSH 2.12 10/15/2017   Lab Results  Component Value Date   WBC 6.0 10/15/2017   HGB 13.8 10/15/2017   HCT 40.9 10/15/2017   MCV 96.1 10/15/2017   PLT 181.0 10/15/2017   Lab Results  Component Value Date   NA 139 10/15/2017   K 4.6 10/15/2017   CO2 31 10/15/2017   GLUCOSE 109 (H) 10/15/2017   BUN 22 10/15/2017   CREATININE 1.15 10/15/2017   BILITOT 0.9 10/15/2017   ALKPHOS 58 10/15/2017   AST 17 10/15/2017   ALT 11 10/15/2017   PROT 6.8 10/15/2017   ALBUMIN 4.1 10/15/2017   CALCIUM 9.4 10/15/2017   ANIONGAP 7 09/14/2016   GFR 66.19 10/15/2017   Lab Results  Component Value Date  CHOL 205 (H) 10/15/2017   Lab Results  Component Value Date   HDL 64.80 10/15/2017   Lab Results  Component Value Date   LDLCALC 120 (H) 10/15/2017   Lab Results  Component Value Date   TRIG 102.0 10/15/2017   Lab Results  Component Value Date   CHOLHDL 3 10/15/2017   Lab Results  Component Value Date   HGBA1C 5.3 10/15/2017         Assessment & Plan:    Problem List Items Addressed This Visit    History of CVA (cerebrovascular accident)   Relevant Orders   Ambulatory referral to Neurology   Stroke [facility] [address])    His neurologist is retiring so new referral is placed      Relevant Medications   simvastatin (ZOCOR) 20 MG tablet   losartan (COZAAR) 100 MG tablet   Essential hypertension    Well controlled, no changes to meds. Encouraged heart healthy diet such as the DASH diet and exercise as tolerated.       Relevant Medications   simvastatin (ZOCOR) 20 MG tablet   losartan (COZAAR) 100 MG tablet   Hyperglycemia    hgba1c acceptable, minimize simple carbs. Increase exercise as tolerated.       HLD (hyperlipidemia)    Encouraged heart healthy diet, increase exercise, avoid trans fats, consider a krill oil cap daily      Relevant Medications   simvastatin (ZOCOR) 20 MG tablet   losartan (COZAAR) 100 MG tablet   Vertebrobasilar artery syndrome   Relevant Medications   simvastatin (ZOCOR) 20 MG tablet   losartan (COZAAR) 100 MG tablet   Other Relevant Orders   Ambulatory referral to Neurology    Other Visit Diagnoses    Restless sleeper    -  Primary   Relevant Orders   Ambulatory referral to Neurology      I have discontinued [name] Coy's methylPREDNISolone and HYDROcodone-homatropine. I have also changed his losartan. Additionally, I am having him maintain his multivitamins ther. w/minerals, cetirizine, Omega-3 Fatty Acids (OMEGA-3 PO), OVER THE COUNTER MEDICATION, Simethicone (GAS-X PO), clopidogrel, fluticasone, gabapentin, albuterol, pantoprazole, citalopram, and simvastatin.  Meds ordered this encounter  Medications  . simvastatin (ZOCOR) 20 MG tablet    Sig: TAKE 1 TABLET(20 MG) BY MOUTH DAILY    Dispense:  90 tablet    Refill:  1  . losartan (COZAAR) 100 MG tablet    Sig: Take 1 tablet (100 mg total) by mouth daily for 6 days.    Dispense:  90 tablet    Refill:  1    CMA served as Education administrator during this visit.  History, Physical and Plan performed by medical provider. Documentation and orders reviewed and attested to.  Penni Homans, MD

## 2018-01-19 ENCOUNTER — Other Ambulatory Visit: Payer: Self-pay | Admitting: Neurology

## 2018-01-19 DIAGNOSIS — I6389 Other cerebral infarction: Secondary | ICD-10-CM

## 2018-01-19 NOTE — Assessment & Plan Note (Signed)
hgba1c acceptable, minimize simple carbs. Increase exercise as tolerated.  

## 2018-01-19 NOTE — Assessment & Plan Note (Signed)
Well controlled, no changes to meds. Encouraged heart healthy diet such as the DASH diet and exercise as tolerated.  °

## 2018-01-19 NOTE — Assessment & Plan Note (Signed)
Encouraged heart healthy diet, increase exercise, avoid trans fats, consider a krill oil cap daily 

## 2018-01-19 NOTE — Assessment & Plan Note (Signed)
His neurologist is retiring so new referral is placed

## 2018-01-20 ENCOUNTER — Other Ambulatory Visit: Payer: Self-pay

## 2018-01-20 ENCOUNTER — Ambulatory Visit (INDEPENDENT_AMBULATORY_CARE_PROVIDER_SITE_OTHER): Payer: Medicare Other | Admitting: *Deleted

## 2018-01-20 DIAGNOSIS — I639 Cerebral infarction, unspecified: Secondary | ICD-10-CM

## 2018-01-20 DIAGNOSIS — I6389 Other cerebral infarction: Secondary | ICD-10-CM

## 2018-01-20 LAB — CUP PACEART REMOTE DEVICE CHECK
Implantable Pulse Generator Implant Date: 20180130
MDC IDC SESS DTM: 20190401234110

## 2018-01-20 MED ORDER — CLOPIDOGREL BISULFATE 75 MG PO TABS: ORAL_TABLET | ORAL | 1 refills | Status: DC

## 2018-01-20 NOTE — Progress Notes (Signed)
Carelink Summary Report / Loop Recorder 

## 2018-01-27 ENCOUNTER — Ambulatory Visit (INDEPENDENT_AMBULATORY_CARE_PROVIDER_SITE_OTHER): Payer: Medicare Other | Admitting: Neurology

## 2018-01-27 ENCOUNTER — Encounter: Payer: Self-pay | Admitting: Neurology

## 2018-01-27 ENCOUNTER — Other Ambulatory Visit: Payer: Self-pay | Admitting: Neurology

## 2018-01-27 VITALS — BP 122/75 | HR 67 | Ht 68.0 in | Wt 163.0 lb

## 2018-01-27 DIAGNOSIS — M79642 Pain in left hand: Secondary | ICD-10-CM

## 2018-01-27 DIAGNOSIS — Q211 Atrial septal defect: Secondary | ICD-10-CM | POA: Diagnosis not present

## 2018-01-27 DIAGNOSIS — Q2112 Patent foramen ovale: Secondary | ICD-10-CM

## 2018-01-27 DIAGNOSIS — G4752 REM sleep behavior disorder: Secondary | ICD-10-CM | POA: Insufficient documentation

## 2018-01-27 DIAGNOSIS — I639 Cerebral infarction, unspecified: Secondary | ICD-10-CM | POA: Diagnosis not present

## 2018-01-27 DIAGNOSIS — I63411 Cerebral infarction due to embolism of right middle cerebral artery: Secondary | ICD-10-CM | POA: Diagnosis not present

## 2018-01-27 MED ORDER — DULOXETINE HCL 30 MG PO CPEP: 30.0000 mg | ORAL_CAPSULE | Freq: Every day | ORAL | 0 refills | Status: DC

## 2018-01-27 MED ORDER — DULOXETINE HCL 60 MG PO CPEP: 60.0000 mg | ORAL_CAPSULE | Freq: Every day | ORAL | 3 refills | Status: DC

## 2018-01-27 NOTE — Progress Notes (Signed)
STROKE NEUROLOGY FOLLOW UP NOTE  NAME: Paul Drake DOB: October 21, 1945  REASON FOR VISIT: stroke follow up HISTORY FROM: pt and chart  Today we had the pleasure of seeing Paul Drake in follow-up at our Neurology Clinic. Pt was accompanied by no one.   History Summary [name] is a LH 72 y.o. male with a history of right MCA infarct in 2008 with residue LLE minimal weakness was admitted on 09/20/14 for acute onset left sided weakness and difficulty with speech.he received tPA. His acute right frontal stroke is adjacent to your previous stroke in 2008 shown in MRI. Stroke work up showed PFO with valsalva on TEE and positive right LE DVT. He was put on Xarelto 15mg  twice a day for 21 days and then 20mg  daily for 5 months. He mentioned that before this happened he also had some diarrhea and he might have done valsalva maneuvers then.   Patient's previous stroke was in 2008 and the patient has a resultant LLE numbness and weakness. Patient was on ASA but stopped it about a month ago due to gastritis.He denies N/V. However, shortly after the gastritis, he developed kidney stone. He may have a lot of pain which caused him to have valsalva maneuvers.   Follow up 11/24/14 - the patient has been doing well. No recurrent stroke like symptoms. He still has left hand numbness which causing trouble with writing and handling tools. He is on Xarelto daily. He followed up with his PCP closely. His BP today in clinic is 136/82. He still has OT twice a week but will soon be completed. His hypercoagulable work up in hospital was negative.   05/09/15 follow up - the patient has been doing well, no recurrent strokelike symptoms. He is still on Xarelto without side effect. However he continued to suffer from kidney stone, sometimes really painful. Urologist hesitant to do lithotripsy due to bleeding risk. He has been on Xarelto for more than 6 months now. He still reports intermittent left-sided numbness and pain,  especially fingers at left hand. No other complaints, blood pressure 125/78. TCD confirmed PFO and no MES on 30 min monitoring.  08/15/15 follow up - pt has been doing well without stroke like symptoms on plavix. His kidney stone was taken out by cystoscopy and did not require surgery. He continues to complain of left hand numbness and tingling, feeling swallow. However, he only takes once a day dose of neurontin and feels drowsiness with neurontin. BP 123/75 in clinic today.    02/14/16 follow up - pt has been doing well from stroke standpoint. Still has left hand numbness especially after active exercise with golf practice. Had kidney stone removal and abdominal pain much improved. However, recently started again belly pain but in different location, more epigastric pain, had CT abdomen which was negative. Considered diverticulitis and put on Abx last week, and seems getting better. BP today was low, 95/62, will decrease losartan dose.   09/26/16 follow up - pt had kidney stone surgery in 07/2016 and off plavix for 5-7 days and currently plavix resumed. However, on 09/14/16, he was walking on the street, had sudden onset staggering, imbalance and kept leaning towards right. He felt dizzy, but not vertigo, no fall, no double vision or speech difficulty. No vomiting but mild nausea. It lasted 15-20 sec and resolved. He managed to get home. Wife called EMS and he was sent to ED. CT head negative. He refused further study and come today for follow up. Denies  leg pain or swelling. BP 104/72  01/16/17 follow up - pt has been doing well, no recurrent stroke like symptoms. LE venous doppler showed no DVT, and CTA head and neck unremarkable. Discussed with Dr. Rayann Heman and loop recorder placed on 10/16/16. So far no afib found. On gabapentin, helps but not able to tolerate higher dose. Would like to continue the low dose. BP 119/64  07/29/18 follow up - pt has been doing well, no recurrent strokelike symptoms.  Loop  recorder no A. fib so far.  Still has intermittent left hand numbness and painful sensation.  Has been taking gabapentin intermittently, felt effective but having drowsy sleepy side effect of the day, tolerating well if only take at night.  BP today in clinic 105/68. Has stopped zocor by himself, but following with PCP for HLD.   Interval History During the interval time, patient has been doing stable.  Follow with PCP, restarted Zocor 20  and losartan for BP control.  Today BP 122/75.  Still complains of left fingers burning/stinging/hurting currently onwith decreased sensation, constant.  Still on night dose Neurontin, not able to tolerate daytime Neurontin due to drowsiness.  Patient also complaining of violent dreams, acting out of dreams, even ruling out of bed with concussion.  Happening twice per week and average 3 dreams per night.  He does have history of sleepwalking when he was young.    REVIEW OF SYSTEMS: Full 14 system review of systems performed and notable only for those listed below and in HPI above, all others are negative:  Constitutional:   Cardiovascular:  Ear/Nose/Throat:  Skin:  Eyes:  Respiratory:   Gastroitestinal:   Genitourinary: frequency of urination Hematology/Lymphatic:   Endocrine: Cold intolerance Musculoskeletal: Neck stiffness Allergy/Immunology:   Neurological:   Psychiatric: Depression Sleep: sleep talking, acting out dreams  The following represents the patient's updated allergies and side effects list: Allergies  Allergen Reactions  . Molds & Smuts Other (See Comments)  . Pollen Extract Other (See Comments)    Runny nose,itchy eyes    The neurologically relevant items on the patient's problem list were reviewed on today's visit.  Neurologic Examination  A problem focused neurological exam (12 or more points of the single system neurologic examination, vital signs counts as 1 point, cranial nerves count for 8 points) was performed.  Blood  pressure 122/75, pulse 67, height 5\' 8"  (1.727 m), weight 163 lb (73.9 kg).  General - Well nourished, well developed, in no apparent distress.  Ophthalmologic - Sharp disc margins OU.  Cardiovascular - Regular rate and rhythm with no murmur.  Mental Status -  Level of arousal and orientation to time, place, and person were intact. Language including expression, naming, repetition, comprehension, reading, and writing was assessed and found intact. Attention span and concentration were normal. Recent and remote memory were intact. Fund of Knowledge was assessed and was intact.  Cranial Nerves II - XII - II - Visual field intact OU. III, IV, VI - Extraocular movements intact. V - Facial sensation intact bilaterally. VII - Facial movement intact bilaterally. VIII - Hearing & vestibular intact bilaterally. X - Palate elevates symmetrically. XI - Chin turning & shoulder shrug intact bilaterally. XII - Tongue protrusion intact.  Motor Strength - The patient's strength was normal in all extremities and pronator drift was absent.  Bulk was normal and fasciculations were absent.   Motor Tone - Muscle tone was assessed at the neck and appendages and was normal.  Reflexes - The patient's reflexes  were normal in all extremities and he had no pathological reflexes.  Sensory - Light touch, temperature/pinprick decreased on the left ulnar hand and all fingertips and Romberg testing were assessed and were normal.    Coordination - The patient had normal movements in the hands and feet with no ataxia or dysmetria.  Tremor was absent.  Gait and Station - The patient's transfers, posture, gait, station, and turns were observed as normal.  Data reviewed: I personally reviewed the images and agree with the radiology interpretations.  Ct Head (brain) Wo Contrast  09/20/2014 IMPRESSION: 1. No acute intracranial pathology seen on CT. 2. Chronic infarct at the high right parietal region, with  associated encephalomalacia. 3. Mild cortical volume loss and scattered small vessel ischemic microangiopathy.   Carotid Doppler Bilateral: 1-39% ICA stenosis. Vertebral artery flow is antegrade.  MRI HEAD Moderate size remote infarct posterior right frontal -parietal lobe with encephalomalacia. Small to moderate size acute infarct along the anterior margin of this remote infarct involving the posterior right frontal lobe extending to the posterior right operculum region. No intracranial hemorrhage.  MRA HEAD Decrease number of visualized right middle cerebral artery branch vessels consistent with patient's remote and acute infarct. No significant stenosis proximal to the branch vessel level. Ectatic vertebral arteries and basilar artery with slight irregularity of the distal left vertebral artery without significant stenosis. Nonvisualized right posterior inferior cerebellar artery. Mild posterior cerebral artery branch vessel irregularity and narrowing more notable on the right.  EKG normal EKG, normal sinus rhythm. For complete results please see formal report.  LE venous doppler - The right lower extremity is positive for deep vein thrombosis involving the right gastrocnemius, posterior tibial, and peroneal veins. There is no evidence of left lower extremity deep vein thrombosis.  TEE -  1) PFO by bubble study not visualized Positive bubble study only with valsalva 2) Normal EF 60% 3) No LAA thrombus 4) Trivial MR 5) Mild AR 6) No aortic debris 7) Mild TR 8) Normal RV  TCD bubble study positive with grade I at rest and grade III with valsalva.  TCD emboli detection - negative for MES.    LE venous doppler 09/27/16 - No evidence of deep vein or superficial thrombosis involving the   right lower extremity and left lower extremity. - No evidence of Baker&'s cyst on the right or left.  CTA head and neck 10/01/16 1. No posterior circulation stenosis to suggest  vertebrobasilar insufficiency. Mild atherosclerosis noted in the proximal left vertebral artery. 2. Mild carotid circulation atherosclerosis without flow limiting stenosis. 3. Remote right MCA territory infarct. No evidence of interval infarct.  Component     Latest Ref Rng & Units 04/01/2015 10/20/2015  Cholesterol     0 - 200 mg/dL 158 158  Triglycerides     0.0 - 149.0 mg/dL 167.0 (H) 90.0  HDL Cholesterol     >39.00 mg/dL 55.10 54.30  VLDL     0.0 - 40.0 mg/dL 33.4 18.0  LDL (calc)     0 - 99 mg/dL 70 85  Total CHOL/HDL Ratio      3 3  NonHDL      102.90 103.44  TSH     0.35 - 4.50 uIU/mL  2.55  Hemoglobin A1C     4.6 - 6.5 %  5.4    Assessment: As you may recall, he is a 72 y.o. Caucasian male with PMH of right MCA CVA in 2008 was admitted on1/4/16 for again right MCA small  stroke. The first stroke more at right motor strip and the last stroke more at sensory strip. Stroke w/u showed positive DVT and a likely small PFO with RLS only on valsalva maneuver by TEE. However, TCD bubble study showed grade 1 RLS at rest and agrees 3 RLS with Valsalva maneuver. TCD MES negative. For the two strokes, it is likely pt had some valsalva maneuver prior to the strokes. He was on Xarelto for more than 6 months and due to kidney stone surgery with bleeding risk, he optioned to switch Xarelto to plavix for stroke prevention. Had TIA symptoms with leaning towards right and imbalance gait on 09/14/16. CT negative. LE venous doppler and CTA head and neck all negative. Loop recorder placed so far no afib.  Still has left hand numbness/pain, on gabapentin intermittently, effective, but only tolerate if taken at night. Will add Cymbalta and stop celexa. Start with melatonin for REM behavior disorder.    Plan:  - continue plavix and zocor for stroke prevention - continue neurontin 200mg  or 300mg  at night daily for the finger numbness and pain.  - will add cymbalta for neuropathic pain and depression. Take  30mg  daily for one week and then 60mg  daily - taper off celexa with 10mg  for 7 days and then off.  - for your REM behavior disorder, please check your melatonin dose, 10mg  nightly at least, can go up to 15mg  nightly if needed. If still not effective, will add clonazepam at night. - Check BP at home and record. - continue monitoring loop recorder - Follow up with your primary care physician for stroke risk factor modification. Recommend maintain blood pressure goal 120-130/80, diabetes with hemoglobin A1c goal below 6.5% and lipids with LDL cholesterol goal below 70 mg/dL.  - follow up in 3 months with Janett Billow.  I spent more than 25 minutes of face to face time with the patient. Greater than 50% of time was spent in counseling and coordination of care. We discussed loop recorder monitoring, gabapentin use and BP monitoring.   No orders of the defined types were placed in this encounter.   Meds ordered this encounter  Medications  . DULoxetine (CYMBALTA) 30 MG capsule    Sig: Take 1 capsule (30 mg total) by mouth daily.    Dispense:  7 capsule    Refill:  0  . DULoxetine (CYMBALTA) 60 MG capsule    Sig: Take 1 capsule (60 mg total) by mouth daily.    Dispense:  30 capsule    Refill:  3    Take 60mg  daily after finish 30mg  daily for 7 days.    Patient Instructions  - continue plavix and zocor for stroke prevention - continue neurontin 200mg  or 300mg  at night daily for the finger numbness and pain.  - will add cymbalta for neuropathic pain and depression. Take 30mg  daily for one week and then 60mg  daily - taper off celexa with 10mg  for 7 days and then off.  - for your REM behavior disorder, please check your melatonin dose, 10mg  nightly at least, can go up to 15mg  nightly if needed. If still not effective, will add clonazepam at night. - Check BP at home and record. - continue monitoring loop recorder - Follow up with your primary care physician for stroke risk factor modification.  Recommend maintain blood pressure goal 120-130/80, diabetes with hemoglobin A1c goal below 6.5% and lipids with LDL cholesterol goal below 70 mg/dL.  - follow up in 3 months with Janett Billow.  Rosalin Hawking, MD PhD [facility] [address] [place], [place] [address] [phone]

## 2018-01-27 NOTE — Patient Instructions (Addendum)
-   continue plavix and zocor for stroke prevention - continue neurontin 200mg  or 300mg  at night daily for the finger numbness and pain.  - will add cymbalta for neuropathic pain and depression. Take 30mg  daily for one week and then 60mg  daily - taper off celexa with 10mg  for 7 days and then off.  - for your REM behavior disorder, please check your melatonin dose, 10mg  nightly at least, can go up to 15mg  nightly if needed. If still not effective, will add clonazepam at night. - Check BP at home and record. - continue monitoring loop recorder - Follow up with your primary care physician for stroke risk factor modification. Recommend maintain blood pressure goal 120-130/80, diabetes with hemoglobin A1c goal below 6.5% and lipids with LDL cholesterol goal below 70 mg/dL.  - follow up in 3 months with Janett Billow.

## 2018-02-11 LAB — CUP PACEART REMOTE DEVICE CHECK
Implantable Pulse Generator Implant Date: 20180130
MDC IDC SESS DTM: 20190505000947

## 2018-02-20 ENCOUNTER — Ambulatory Visit (INDEPENDENT_AMBULATORY_CARE_PROVIDER_SITE_OTHER): Payer: Medicare Other | Admitting: *Deleted

## 2018-02-20 DIAGNOSIS — I639 Cerebral infarction, unspecified: Secondary | ICD-10-CM

## 2018-02-21 NOTE — Progress Notes (Signed)
Carelink Summary Report / Loop Recorder 

## 2018-03-12 ENCOUNTER — Other Ambulatory Visit: Payer: Self-pay | Admitting: Family Medicine

## 2018-03-25 ENCOUNTER — Ambulatory Visit (INDEPENDENT_AMBULATORY_CARE_PROVIDER_SITE_OTHER): Payer: Medicare Other | Admitting: *Deleted

## 2018-03-25 DIAGNOSIS — I639 Cerebral infarction, unspecified: Secondary | ICD-10-CM

## 2018-03-26 NOTE — Progress Notes (Signed)
Carelink Summary Report / Loop Recorder 

## 2018-03-28 ENCOUNTER — Telehealth: Payer: Self-pay

## 2018-03-28 NOTE — Telephone Encounter (Signed)
Pt didn't want to wait so he will see Dr. Nani Ravens Monday 03/31/18 @ 9:45am

## 2018-03-28 NOTE — Telephone Encounter (Signed)
Copied from [place] [id number]. Topic: Appointment Scheduling - Scheduling Inquiry for Clinic >> Mar 25, 2018  3:08 PM Scherrie Gerlach wrote: Reason for CRM: pt states he he has what he thinks a ganglion cyst on his wrist that is becoming bothersome.  Prefers to see Dr Charlett Blake is he can. Please advise if pt can be worked in for appt   >> Mar 27, 2018  1:20 PM [place], [place] C wrote: Pt called back in to follow up on her message sent. Please advise / assist with scheduling.    -[place], Please explain to patient her availability and he can see another provider if he wants to be seen sooner. Thanks, pc

## 2018-03-31 ENCOUNTER — Encounter: Payer: Self-pay | Admitting: Family Medicine

## 2018-03-31 ENCOUNTER — Ambulatory Visit (INDEPENDENT_AMBULATORY_CARE_PROVIDER_SITE_OTHER): Payer: Medicare Other | Admitting: Family Medicine

## 2018-03-31 VITALS — BP 120/72 | HR 60 | Temp 97.6°F | Ht 68.0 in | Wt 162.1 lb

## 2018-03-31 DIAGNOSIS — M6740 Ganglion, unspecified site: Secondary | ICD-10-CM | POA: Diagnosis not present

## 2018-03-31 LAB — CUP PACEART REMOTE DEVICE CHECK
Date Time Interrogation Session: 20190607003940
MDC IDC PG IMPLANT DT: 20180130

## 2018-03-31 NOTE — Progress Notes (Signed)
Pre visit review using our clinic review tool, if applicable. No additional management support is needed unless otherwise documented below in the visit note. 

## 2018-03-31 NOTE — Patient Instructions (Signed)
Ice for rest of day.  Antibiotic ointment over area.  Keep clean and dry.   If this quickly comes back, let me know.  Let us know if you need anything.

## 2018-03-31 NOTE — Progress Notes (Signed)
Chief Complaint  Patient presents with  . Cyst    Paul Drake is a 72 y.o. male here for a skin complaint.  Duration: 1 week Location: L wrist Pruritic? No Painful? Yes Drainage? No No inj or change in activity.  ROS:  Const: No fevers Skin: As noted in HPI  Past Medical History:  Diagnosis Date  . Allergic state 12/29/2013  . Arthritis   . Barrett's esophagus   . BPH (benign prostatic hyperplasia)   . Chicken pox   . CVA (cerebral infarction)   . Depression   . GERD (gastroesophageal reflux disease)   . HTN (hypertension), benign 04/01/2015  . Hyperglycemia 01/06/2015  . Hyperlipemia   . Hypertension   . IBS (irritable bowel syndrome)   . Kidney stones   . Left hand pain 07/21/2017  . Measles as a child  . Medicare annual wellness visit, subsequent 06/26/2015  . Mumps as a child  . Otitis externa 12/10/2013  . Seasonal allergies    some asthma  . Stroke ([place])   . Unspecified asthma(493.90) 12/29/2013   BP 120/72 (BP Location: Left Arm, Patient Position: Sitting, Cuff Size: Normal)   Pulse 60   Temp 97.6 F (36.4 C) (Oral)   Ht 5\' 8"  (1.727 m)   Wt 162 lb 2 oz (73.5 kg)   SpO2 96%   BMI 24.65 kg/m  Gen: awake, alert, appearing stated age Lungs: No accessory muscle use Skin: Over anatomic snuffbox, there is a circular and raised lesion.  No pigment changes.  There is mild tenderness to palpation.  There feels like a gelatinous substance on the inside.  No active drainage or opening.  There is no erythema, excessive warmth, fluctuance. Psych: Age appropriate judgment and insight  Procedure note; ganglion cyst aspiration Verbal consent obtained. The area of interest was palpated and cleaned with alcohol x1. Free spray was used for topical anesthesia. A 21-gauge needle was inserted and a 3 mm syringe attached was used to create a vacuum. Approximately 1.5 mLs of gelatinous substance was removed. The area was then bandaged. The patient tolerated the procedure  well.   There were no immediate complications noted.  Ganglion cyst - Plan: PR ASPIRAT/INJECTION GANGLION CYST(S)  Orders as above. Hopefully this provides him lasting relief.  If not, we will have to refer to hand surgery for further management of painful ganglion cyst. F/u prn. The patient voiced understanding and agreement to the plan.  [place], DO 03/31/18 11:53 AM

## 2018-04-07 ENCOUNTER — Ambulatory Visit (HOSPITAL_BASED_OUTPATIENT_CLINIC_OR_DEPARTMENT_OTHER)
Admission: RE | Admit: 2018-04-07 | Discharge: 2018-04-07 | Disposition: A | Discharge status: Home / Self Care | Payer: Medicare Other | Source: Ambulatory Visit | Attending: Medical | Admitting: Medical

## 2018-04-07 ENCOUNTER — Encounter: Payer: Self-pay | Admitting: Medical

## 2018-04-07 ENCOUNTER — Ambulatory Visit (INDEPENDENT_AMBULATORY_CARE_PROVIDER_SITE_OTHER): Payer: Medicare Other | Admitting: Medical

## 2018-04-07 VITALS — BP 131/67 | HR 61 | Temp 98.1°F | Resp 16 | Ht 68.0 in | Wt 163.4 lb

## 2018-04-07 DIAGNOSIS — I639 Cerebral infarction, unspecified: Secondary | ICD-10-CM | POA: Diagnosis not present

## 2018-04-07 DIAGNOSIS — J029 Acute pharyngitis, unspecified: Secondary | ICD-10-CM

## 2018-04-07 DIAGNOSIS — R05 Cough: Secondary | ICD-10-CM

## 2018-04-07 DIAGNOSIS — R059 Cough, unspecified: Secondary | ICD-10-CM

## 2018-04-07 MED ORDER — AMOXICILLIN 875 MG PO TABS: 875.0000 mg | ORAL_TABLET | Freq: Two times a day (BID) | ORAL | 0 refills | Status: DC

## 2018-04-07 MED ORDER — BENZONATATE 100 MG PO CAPS: 100.0000 mg | ORAL_CAPSULE | Freq: Three times a day (TID) | ORAL | 0 refills | Status: DC | PRN

## 2018-04-07 NOTE — Progress Notes (Signed)
Subjective:    Patient ID: Paul Drake, male    DOB: 08/06/1946, 72 y.o.   MRN: [id number]  HPI  Pt in with sore throat that feels swollen painful(hurts to swallo). He has some productive cough and mild ha. Pt grandaughter had strep. Pt states feels very tired over last week.  Pt grand daughter dx about 10 days ago.   Pt lives in same home with grand daughter.  Pt had cough for about a week.   Review of Systems  Constitutional: Positive for fatigue. Negative for chills and fever.  HENT: Positive for sore throat. Negative for congestion and dental problem.   Respiratory: Positive for cough. Negative for chest tightness, shortness of breath and wheezing.   Cardiovascular: Negative for chest pain and palpitations.  Gastrointestinal: Negative for abdominal distention, abdominal pain, blood in stool, diarrhea, nausea and vomiting.  Genitourinary: Negative for dysuria, flank pain and frequency.  Musculoskeletal: Positive for myalgias. Negative for back pain.       Mild ache all over.  Skin: Negative for pallor and rash.  Neurological: Negative for dizziness, speech difficulty, weakness, numbness and headaches.  Hematological: Negative for adenopathy. Does not bruise/bleed easily.  Psychiatric/Behavioral: Negative for behavioral problems, confusion, decreased concentration and sleep disturbance. The patient is not nervous/anxious.     Past Medical History:  Diagnosis Date  . Allergic state 12/29/2013  . Arthritis   . Barrett's esophagus   . BPH (benign prostatic hyperplasia)   . Chicken pox   . CVA (cerebral infarction)   . Depression   . GERD (gastroesophageal reflux disease)   . HTN (hypertension), benign 04/01/2015  . Hyperglycemia 01/06/2015  . Hyperlipemia   . Hypertension   . IBS (irritable bowel syndrome)   . Kidney stones   . Left hand pain 07/21/2017  . Measles as a child  . Medicare annual wellness visit, subsequent 06/26/2015  . Mumps as a child  . Otitis externa  12/10/2013  . Seasonal allergies    some asthma  . Stroke ([place])   . Unspecified asthma(493.90) 12/29/2013     Social History   Socioeconomic History  . Marital status: Married    Spouse name: Webb Silversmith  . Number of children: 2  . Years of education: college  . Highest education level: Not on file  Occupational History    Comment: retired  Scientific laboratory technician  . Financial resource strain: Not on file  . Food insecurity:    Worry: Not on file    Inability: Not on file  . Transportation needs:    Medical: Not on file    Non-medical: Not on file  Tobacco Use  . Smoking status: Never Smoker  . Smokeless tobacco: Never Used  Substance and Sexual Activity  . Alcohol use: Yes    Alcohol/week: 0.6 oz    Types: 1 Shots of liquor per week    Comment: 1-2 occasioinal  . Drug use: No  . 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 Physical activity:    Days per week: Not on file    Minutes per session: Not on file  . Stress: Not on file  Relationships  . Social connections:    Talks on phone: Not on file    Gets together: Not on file    Attends religious service: Not on file    Active member of club or organization: Not on file    Attends meetings of  clubs or organizations: Not on file    Relationship status: Not on file  . Intimate partner violence:    Fear of current or ex partner: Not on file    Emotionally abused: Not on file    Physically abused: Not on file    Forced sexual activity: Not on file  Other Topics Concern  . Not on file  Social History Narrative   Patient consumes 3 cups of caffeine daily   Left handed   Lives at home with his wife. They are living in their daughter's house to help to help take care of their granddaughter.    Past Surgical History:  Procedure Laterality Date  . COLONOSCOPY    . EP IMPLANTABLE DEVICE N/A 10/16/2016   Procedure: Loop Recorder Insertion;  Surgeon: Thompson Grayer, MD;   Location: [facility];  Service: Cardiovascular;  Laterality: N/A;  . LITHOTRIPSY     multiple times  . PROSTATE SURGERY  08/2017  . TEE WITHOUT CARDIOVERSION N/A 09/22/2014   Procedure: TRANSESOPHAGEAL ECHOCARDIOGRAM (TEE);  Surgeon: Josue Hector, MD;  Location: [facility];  Service: Cardiovascular;  Laterality: N/A;  . WISDOM TOOTH EXTRACTION      Family History  Problem Relation Age of Onset  . Hypertension Mother   . Hyperlipidemia Mother   . Fibromyalgia Mother   . Arthritis Mother        rheumatoid  . Diabetes Sister        type 2  . Hyperlipidemia Brother   . Hypertension Brother   . Ulcers Father 36       Bleeding Ulcers  . Kidney Stones Daughter   . Asthma Daughter   . Healthy Son   . Cancer Maternal Grandfather        skin ?  . Stroke Maternal Aunt   . Cancer Maternal Uncle        prostate    Allergies  Allergen Reactions  . Molds & Smuts Other (See Comments)  . Pollen Extract Other (See Comments)    Runny nose,itchy eyes    Current Outpatient Medications on File Prior to Visit  Medication Sig Dispense Refill  . albuterol (PROVENTIL HFA;VENTOLIN HFA) 108 (90 Base) MCG/ACT inhaler Inhale 2 puffs into the lungs every 6 (six) hours as needed for wheezing or shortness of breath. 1 Inhaler 3  . cetirizine (ZYRTEC) 10 MG tablet Take 10 mg by mouth daily.     . clopidogrel (PLAVIX) 75 MG tablet TAKE 1 TABLET(75 MG) BY MOUTH DAILY 90 tablet 1  . DULoxetine (CYMBALTA) 30 MG capsule Take 1 capsule (30 mg total) by mouth daily. 7 capsule 0  . DULoxetine (CYMBALTA) 60 MG capsule Take 1 capsule (60 mg total) by mouth daily. 30 capsule 3  . fluticasone (FLONASE) 50 MCG/ACT nasal spray SHAKE WELL AND USE 2 SPRAYS IN EACH NOSTRIL DAILY 16 g 6  . gabapentin (NEURONTIN) 100 MG capsule Take 2 capsules (200 mg total) by mouth 3 (three) times daily. (Patient taking differently: Take 200 mg by mouth at bedtime. ) 180 capsule 5  . Multiple Vitamins-Minerals (MULTIVITAMINS  THER. W/MINERALS) TABS tablet Take 1 tablet by mouth daily.    . Omega-3 Fatty Acids (OMEGA-3 PO) Take 1 capsule by mouth daily.    . pantoprazole (PROTONIX) 40 MG tablet TAKE 1 TABLET(40 MG) BY MOUTH DAILY 90 tablet 0  . Simethicone (GAS-X PO) Take 2 tablets by mouth daily as needed (gas).    . simvastatin (ZOCOR) 20 MG tablet  TAKE 1 TABLET(20 MG) BY MOUTH DAILY 90 tablet 1  . losartan (COZAAR) 100 MG tablet Take 1 tablet (100 mg total) by mouth daily for 6 days. 90 tablet 1   No current facility-administered medications on file prior to visit.     BP 131/67   Pulse 61   Temp 98.1 F (36.7 C) (Oral)   Resp 16   Ht 5\' 8"  (1.727 m)   Wt 163 lb 6.4 oz (74.1 kg)   SpO2 100%   BMI 24.84 kg/m       Objective:   Physical Exam  General  Mental Status - Alert. General Appearance - Well groomed. Not in acute distress.  Skin Rashes- No Rashes.  HEENT Head- Normal. Ear Auditory Canal - Left- Normal. Right - Normal.Tympanic Membrane- Left- Normal. Right- Normal. Eye Sclera/Conjunctiva- Left- Normal. Right- Normal. Nose & Sinuses Nasal Mucosa- Left-  Boggy and Congested. Right-  Boggy and  Congested.Bilateral  No maxillary and  No frontal sinus pressure. Mouth & Throat Lips: Upper Lip- Normal: no dryness, cracking, pallor, cyanosis, or vesicular eruption. Lower Lip-Normal: no dryness, cracking, pallor, cyanosis or vesicular eruption. Buccal Mucosa- Bilateral- No Aphthous ulcers. Oropharynx- No Discharge or Erythema. Tonsils: Characteristics- Bilateral- Erythema . Size/Enlargement- Bilateral- 1+enlargement. Discharge- bilateral-None.  Neck Neck- Supple. No Masses.moderate enlarged lymph node bilaterally.   Chest and Lung Exam Auscultation: Breath Sounds:-Clear even and unlabored.  Cardiovascular Auscultation:Rythm- Regular, rate and rhythm. Murmurs & Other Heart Sounds:Ausculatation of the heart reveal- No Murmurs.  Lymphatic Head & Neck General Head & Neck  Lymphatics: Bilateral: Description- see neck exam.      Assessment & Plan:  Your strep test was negative. However, your physical exam and clinical presentation is suspicious for strep and it is important to note that rapid strep test can be falsely negative. So I am going to give you amoxicillin antibiotic today based on your exam and clinical presentation.  Will get chest xray to make sure no walking pneumonia.   Benzonatate for cough and continue allergy meds.  Rest hydrate, tylenol for fever, and warm salt water gargles.   Follow up in 7 days or as needed.      Mackie Pai, PA-C

## 2018-04-07 NOTE — Patient Instructions (Addendum)
Your strep test was negative. However, your physical exam and clinical presentation is suspicious for strep and it is important to note that rapid strep test can be falsely negative. So I am going to give you amoxicillin antibiotic today based on your exam and clinical presentation.  Will get chest xray to make sure no walking pneumonia.   Benzonatate for cough and continue allergy meds.  Rest hydrate, tylenol for fever, and warm salt water gargles.   Follow up in 7 days or as needed.

## 2018-04-16 ENCOUNTER — Encounter: Payer: Self-pay | Admitting: Gastroenterology

## 2018-04-16 ENCOUNTER — Telehealth: Payer: Self-pay

## 2018-04-16 ENCOUNTER — Ambulatory Visit (INDEPENDENT_AMBULATORY_CARE_PROVIDER_SITE_OTHER): Payer: Medicare Other | Admitting: Gastroenterology

## 2018-04-16 VITALS — BP 110/70 | HR 72 | Ht 68.0 in | Wt 165.6 lb

## 2018-04-16 DIAGNOSIS — K2270 Barrett's esophagus without dysplasia: Secondary | ICD-10-CM | POA: Diagnosis not present

## 2018-04-16 DIAGNOSIS — Z7902 Long term (current) use of antithrombotics/antiplatelets: Secondary | ICD-10-CM | POA: Diagnosis not present

## 2018-04-16 DIAGNOSIS — I639 Cerebral infarction, unspecified: Secondary | ICD-10-CM | POA: Diagnosis not present

## 2018-04-16 NOTE — Telephone Encounter (Signed)
   Oren Barella 1946-06-18 [id number]  Dear Dr. Erlinda Hong:  We have scheduled the above named patient for a(n) Upper Endoscopy procedure. Our records show that (s)he is on anticoagulation therapy.  Please advise as to whether the patient may come off their therapy of Plavix 5 days prior to their procedure which is scheduled for 06/16/18.  Please route your response to Marlon Pel, CMA or fax response to [phone].  Sincerely,    [facility]

## 2018-04-16 NOTE — Progress Notes (Signed)
    History of Present Illness: This is a 72 year old male with short segment Barrett's esophagus without dysplasia maintained on Plavix.  His reflux symptoms are under very good control on his current regimen though he states he has had times when his reflux symptoms have been more active.  He is uncertain what causes his reflux symptoms to flare.  No other gastrointestinal complaints.  Current Medications, Allergies, Past Medical History, Past Surgical History, Family History and Social History were reviewed in [employer] record.  Physical Exam: General: Well developed, well nourished, no acute distress Head: Normocephalic and atraumatic Eyes:  sclerae anicteric, EOMI Ears: Normal auditory acuity Mouth: No deformity or lesions Lungs: Clear throughout to auscultation Heart: Regular rate and rhythm; no murmurs, rubs or bruits Abdomen: Soft, non tender and non distended. No masses, hepatosplenomegaly or hernias noted. Normal Bowel sounds Rectal: Not done Musculoskeletal: Symmetrical with no gross deformities  Pulses:  Normal pulses noted Extremities: No clubbing, cyanosis, edema or deformities noted Neurological: Alert oriented x 4, grossly nonfocal Psychological:  Alert and cooperative. Normal mood and affect  Assessment and Recommendations:  1.  Short segment Barrett's esophagus without dysplasia.  Continue pantoprazole 40 mg daily and follow standard antireflux measures.  I encouraged him to call if his reflux symptoms are not well controlled to consider the addition of ranitidine at night or pantoprazole twice daily.  He is a few months overdue for's surveillance EGD.  Schedule EGD. The risks (including bleeding, perforation, infection, missed lesions, medication reactions and possible hospitalization or surgery if complications occur), benefits, and alternatives to endoscopy with possible biopsy and possible dilation were discussed with the patient and they consent  to proceed.   2. Hold Plavix 5 days before procedure - will instruct when and how to resume after procedure. Low but real risk of cardiovascular event such as heart attack, stroke, embolism, thrombosis or ischemia/infarct of other organs off Plavix explained and need to seek urgent help if this occurs. The patient consents to proceed. Will communicate by phone or EMR with patient's prescribing provider to confirm that holding Plavix is reasonable in this case.

## 2018-04-16 NOTE — Patient Instructions (Signed)
You have been scheduled for an endoscopy. Please follow written instructions given to you at your visit today. If you use inhalers (even only as needed), please bring them with you on the day of your procedure. Your physician has requested that you go to www.startemmi.com and enter the access code given to you at your visit today. This web site gives a general overview about your procedure. However, you should still follow specific instructions given to you by our office regarding your preparation for the procedure.  Thank you for choosing me and [facility].  Malcolm T. Stark, Jr., MD., FACG  

## 2018-04-18 NOTE — Telephone Encounter (Signed)
Patient may stop plavix 5 days prior to his incoming procedure. However, pt has to be aware that it carries a small but acceptable peri-operative risk for TIA/stroke while off plavix. If it is appropriate, we recommend ASA 81mg  daily for the days pt is off plavix. plavix can be resumed post-procedure if hemodynamically stable. Thanks.   Rosalin Hawking, MD PhD Stroke Neurology 04/18/2018 10:05 PM

## 2018-04-21 NOTE — Telephone Encounter (Signed)
Patient notified to hold Plavix 5 days prior to his procedure and to take a 81 mg ASA during those 5 days. Patient verbalized understanding.

## 2018-04-22 ENCOUNTER — Telehealth: Payer: Self-pay

## 2018-04-22 NOTE — Telephone Encounter (Signed)
Copied from [place]. Topic: Quick Communication - See Telephone Encounter >> Apr 22, 2018  3:30 PM Hewitt Shorts wrote: Pt is needing to know if he needs to come in or can he be referred because the cyst that he has had removed has returned   Best number is [phone]    Please advise

## 2018-04-22 NOTE — Telephone Encounter (Signed)
I am willing to refer him to [place] sugery but we need to confirm where the cyst is

## 2018-04-23 ENCOUNTER — Other Ambulatory Visit: Payer: Self-pay | Admitting: Family Medicine

## 2018-04-23 DIAGNOSIS — M67432 Ganglion, left wrist: Secondary | ICD-10-CM

## 2018-04-23 NOTE — Progress Notes (Unsigned)
amb ref °

## 2018-04-23 NOTE — Telephone Encounter (Signed)
Called patient verified with him that cyst  Is on his Left wrist.

## 2018-04-28 ENCOUNTER — Ambulatory Visit (INDEPENDENT_AMBULATORY_CARE_PROVIDER_SITE_OTHER): Payer: Medicare Other | Admitting: *Deleted

## 2018-04-28 DIAGNOSIS — R109 Unspecified abdominal pain: Secondary | ICD-10-CM | POA: Diagnosis not present

## 2018-04-28 DIAGNOSIS — I639 Cerebral infarction, unspecified: Secondary | ICD-10-CM

## 2018-04-28 DIAGNOSIS — N200 Calculus of kidney: Secondary | ICD-10-CM | POA: Diagnosis not present

## 2018-04-28 NOTE — Progress Notes (Signed)
Carelink Summary Report / Loop Recorder 

## 2018-05-01 ENCOUNTER — Encounter: Payer: Self-pay | Admitting: Adult Health

## 2018-05-01 ENCOUNTER — Ambulatory Visit (INDEPENDENT_AMBULATORY_CARE_PROVIDER_SITE_OTHER): Payer: Medicare Other | Admitting: Adult Health

## 2018-05-01 VITALS — BP 119/81 | HR 75 | Wt 164.6 lb

## 2018-05-01 DIAGNOSIS — G4752 REM sleep behavior disorder: Secondary | ICD-10-CM

## 2018-05-01 DIAGNOSIS — E785 Hyperlipidemia, unspecified: Secondary | ICD-10-CM | POA: Diagnosis not present

## 2018-05-01 DIAGNOSIS — I10 Essential (primary) hypertension: Secondary | ICD-10-CM

## 2018-05-01 DIAGNOSIS — I63411 Cerebral infarction due to embolism of right middle cerebral artery: Secondary | ICD-10-CM

## 2018-05-01 LAB — CUP PACEART REMOTE DEVICE CHECK
Date Time Interrogation Session: 20190710013833
Implantable Pulse Generator Implant Date: 20180130

## 2018-05-01 MED ORDER — CLONAZEPAM 0.5 MG PO TABS: 0.5000 mg | ORAL_TABLET | Freq: Every day | ORAL | 3 refills | Status: DC

## 2018-05-01 NOTE — Patient Instructions (Signed)
Continue clopidogrel 75 mg daily  and simvastatin  for secondary stroke prevention  Continue melatonin 15mg  at night and start clonazepam 0.5mg  at night - if after 1 month, this is helping and you are tolerating well but continue to have some acting out, let us know and we can increase dose  Continue to follow up with PCP regarding cholesterol and blood pressure management   We will continue to monitor loop recorder  Continue to monitor blood pressure at home  Maintain strict control of hypertension with blood pressure goal below 130/90, diabetes with hemoglobin A1c goal below 6.5% and cholesterol with LDL cholesterol (bad cholesterol) goal below 70 mg/dL. I also advised the patient to eat a healthy diet with plenty of whole grains, cereals, fruits and vegetables, exercise regularly and maintain ideal body weight.  Followup in the future with me in 6 months or call earlier if needed       Thank you for coming to see [place] at [facility]. I hope we have been able to provide you high quality care today.  You may receive a patient satisfaction survey over the next few weeks. We would appreciate your feedback and comments so that we may continue to improve ourselves and the health of our patients.

## 2018-05-01 NOTE — Progress Notes (Signed)
STROKE NEUROLOGY FOLLOW UP NOTE  NAME: Paul Drake DOB: 01-17-1946  REASON FOR VISIT: stroke follow up HISTORY FROM: pt and chart  Today we had the pleasure of seeing Paul Drake in follow-up at our Neurology Clinic. Pt was accompanied by no one.   History Summary Paul Drake is a LH 72 y.o. male with a history of right MCA infarct in 2008 with residue LLE minimal weakness was admitted on 09/20/14 for acute onset left sided weakness and difficulty with speech.he received tPA. His acute right frontal stroke is adjacent to your previous stroke in 2008 shown in MRI. Stroke work up showed PFO with valsalva on TEE and positive right LE DVT. He was put on Xarelto 15mg  twice a day for 21 days and then 20mg  daily for 5 months. He mentioned that before this happened he also had some diarrhea and he might have done valsalva maneuvers then.   Patient's previous stroke was in 2008 and the patient has a resultant LLE numbness and weakness. Patient was on ASA but stopped it about a month ago due to gastritis.He denies N/V. However, shortly after the gastritis, he developed kidney stone. He may have a lot of pain which caused him to have valsalva maneuvers.    09/26/16 follow up - pt had kidney stone surgery in 07/2016 and off plavix for 5-7 days and currently plavix resumed. However, on 09/14/16, he was walking on the street, had sudden onset staggering, imbalance and kept leaning towards right. He felt dizzy, but not vertigo, no fall, no double vision or speech difficulty. No vomiting but mild nausea. It lasted 15-20 sec and resolved. He managed to get home. Wife called EMS and he was sent to ED. CT head negative. He refused further study and come today for follow up. Denies leg pain or swelling. BP 104/72  01/16/17 follow up - LE venous doppler showed no DVT, and CTA head and neck unremarkable. Discussed with Dr. Rayann Heman and loop recorder placed on 10/16/16.   01/27/18 visit JX: During the interval time,  patient has been doing stable.  Follow with PCP, restarted Zocor 20  and losartan for BP control.  Today BP 122/75.  Still complains of left fingers burning/stinging/hurting currently onwith decreased sensation, constant.  Still on night dose Neurontin, not able to tolerate daytime Neurontin due to drowsiness.  Patient also complaining of violent dreams, acting out of dreams, even ruling out of bed with concussion.  Happening twice per week and average 3 dreams per night.  He does have history of sleepwalking when he was young.   Interval History 05/01/18: Patient is being seen today for routine scheduled office visit for stroke follow-up.  He continues to do well from a stroke standpoint.  After previous visit, patient was placed on Cymbalta due to continued nerve pain in left hand and he states that he has obtained great benefit from this medication as he has very limited amount of pain in his hand.  He does state with increased activity he will have some pain but even this has improved.  He does continue to take Neurontin 300 mg at night as he is unable to tolerate during the day due to making him too fatigued.  He also start taking melatonin 15 mg for REM behavior disorder as recommended at previous visit.  He states since starting this, he has not been as violent during his dreams or throwing himself out of the bed but he does still continue to have violent dreams  with some acting out.  Patient continues to take Plavix without side effects of bleeding and bruising.  Continues to take simvastatin without side effects of myalgias.  Loop recorder has not shown atrial fibrillation thus far.  Upon assessment, heart rate found to be irregular with patient asymptomatic.  Will contact cardiology to obtain strip during this time to ensure this is not atrial fibrillation.  Blood pressure today satisfactory 119/81.  Patient denies new or worsening stroke/TIA symptoms.   REVIEW OF SYSTEMS: Full 14 system review of  systems performed and notable only for those listed below and in HPI above, all others are negative:  Sleep talking and acting out dreams  The following represents the patient's updated allergies and side effects list: Allergies  Allergen Reactions  . Molds & Smuts Other (See Comments)  . Pollen Extract Other (See Comments)    Runny nose,itchy eyes    The neurologically relevant items on the patient's problem list were reviewed on today's visit.  Neurologic Examination  A problem focused neurological exam (12 or more points of the single system neurologic examination, vital signs counts as 1 point, cranial nerves count for 8 points) was performed.  Blood pressure 119/81, pulse 75, weight 164 lb 9.6 oz (74.7 kg).  General - Well nourished, well developed, pleasant elderly Caucasian male, in no apparent distress.  Ophthalmologic - Sharp disc margins OU.  Cardiovascular -irregular rate and rhythm with no murmur.  Mental Status -  Level of arousal and orientation to time, place, and person were intact. Language including expression, naming, repetition, comprehension, reading, and writing was assessed and found intact. Attention span and concentration were normal. Recent and remote memory were intact. Fund of Knowledge was assessed and was intact.  Cranial Nerves II - XII - II - Visual field intact OU. III, IV, VI - Extraocular movements intact. V - Facial sensation intact bilaterally. VII - Facial movement intact bilaterally. VIII - Hearing & vestibular intact bilaterally. X - Palate elevates symmetrically. XI - Chin turning & shoulder shrug intact bilaterally. XII - Tongue protrusion intact.  Motor Strength - The patient's strength was normal in all extremities and pronator drift was absent.  Bulk was normal and fasciculations were absent.   Motor Tone - Muscle tone was assessed at the neck and appendages and was normal.  Reflexes - The patient's reflexes were normal in all  extremities and he had no pathological reflexes.  Sensory - Light touch, temperature/pinprick decreased on the left ulnar hand and all fingertips and Romberg testing were assessed and were normal.    Coordination - The patient had normal movements in the hands and feet with no ataxia or dysmetria.  Tremor was absent.  Gait and Station - The patient's transfers, posture, gait, station, and turns were observed as normal.    Assessment: As you may recall, he is a 72 y.o. Caucasian male with PMH of right MCA CVA in 2008 was admitted on1/4/16 for again right MCA small stroke. Had TIA symptoms with leaning towards right and imbalance gait on 09/14/16. CT negative. Loop recorder placed so far no afib.  Patient is being seen today for stroke follow-up and overall continues to do well from a stroke standpoint.  Continues to complain of REM behavior disorder and heart rate was found to be irregular.  Plan:  - continue plavix and zocor for stroke prevention -will contact cardiology for assessment of loop recorder readings on today's date at 11:40 AM as heart rate was irregular -Recommended to  continue melatonin 15 mg nightly and will add clonazepam 0.5 mg at night for REM behavior disorder -advised patient if tolerating well but continues to have symptoms of REM behavior disorder to call us to consider increasing clonazepam to 1 mg at night if needed -Continue current dose of Cymbalta along with Neurontin for continued paresthesia -Continue to follow-up with PCP regarding HLD and HTN management and monitoring - Check BP at home and record. - continue monitoring loop recorder - Recommend maintain blood pressure goal 120-130/80, diabetes with hemoglobin A1c goal below 6.5% and lipids with LDL cholesterol goal below 70 mg/dL.   Recommend follow-up in 6 months or call earlier if needed  I spent more than 25 minutes of face to face time with the patient. Greater than 50% of time was spent in counseling and  coordination of care. We discussed loop recorder monitoring, gabapentin use and BP monitoring.   Venancio Poisson, AGNP-BC  [facility] [address] [place], [place] [address]  Phone [phone] Fax [phone] Note: This document was prepared with digital dictation and possible smart phrase technology. Any transcriptional errors that result from this process are unintentional.

## 2018-05-01 NOTE — Progress Notes (Signed)
I agree with the above plan 

## 2018-05-02 ENCOUNTER — Other Ambulatory Visit: Payer: Self-pay | Admitting: Neurology

## 2018-05-13 ENCOUNTER — Telehealth: Payer: Self-pay | Admitting: *Deleted

## 2018-05-13 NOTE — Telephone Encounter (Signed)
LMOVM (DPR) requesting manual Carelink transmission for review. Patient's monitor has not connected since 05/01/18.

## 2018-05-13 NOTE — Telephone Encounter (Signed)
Manual transmission received and reviewed. No episodes/abnormalities noted, presenting rhythm is NSR at ~75bpm.

## 2018-05-13 NOTE — Telephone Encounter (Signed)
Spoke w/ pt and attempted to help send a remote transmission. But pt could not get the screen to light up at first. Pt tried another electrical outlet. The screen finally came on but it turned back off and he could not get the screen to light up again. I instructed pt to call tech support.

## 2018-05-13 NOTE — Progress Notes (Addendum)
Subjective:   Paul Drake is a 72 y.o. male who presents for Medicare Annual/Subsequent preventive examination.  Pt still enjoys painting with water colors.  Pt states he is currently under a lot of stress. He is driving to [place] today to visit his 30 year old mother whom is dying.  Review of Systems:  No ROS.  Medicare Wellness Visit. Additional risk factors are reflected in the social history.  Cardiac Risk Factors include: advanced age (>78men, >72 women);dyslipidemia;hypertension;male gender   Sleep patterns:  Pt states he has very vivid dreams that can wake him [place]. Has been prescribed Klonopin as needed-states he is not taking. Home Safety/Smoke Alarms: Feels safe in home. Smoke alarms in place. Lives with wife and dog in 2 story home. Daughter and and granddaughter live with them.   Male:   CCS-  09/30/13 normal per pt   PSA- No results found for: PSA Eye- yearly per pt.     Objective:    Vitals: BP (!) 150/90 (BP Location: Left Arm, Patient Position: Sitting, Cuff Size: Normal)   Pulse 65   Ht 5\' 8"  (1.727 m)   Wt 165 lb (74.8 kg)   SpO2 98%   BMI 25.09 kg/m   Body mass index is 25.09 kg/m.  Advanced Directives 05/16/2018 10/16/2016 10/15/2016 09/14/2016 06/16/2015 11/24/2014 10/05/2014  Does Patient Have a Medical Advance Directive? Yes Yes Yes Yes Yes Yes Yes  Type of Paramedic of [place];Living will [place];Living will [place];Living will [place];Living will - - Living will  Does patient want to make changes to medical advance directive? - No - Patient declined - - - - -  Copy of [place] in Chart? Yes No - copy requested No - copy requested - - - -    Tobacco Social History   Tobacco Use  Smoking Status Never Smoker  Smokeless Tobacco Never Used     Counseling given: Not Answered   Clinical Intake Pain : No/denies pain     Past Medical History:    Diagnosis Date  . Allergic state 12/29/2013  . Arthritis   . Barrett's esophagus   . BPH (benign prostatic hyperplasia)   . Chicken pox   . CVA (cerebral infarction)   . Depression   . GERD (gastroesophageal reflux disease)   . HTN (hypertension), benign 04/01/2015  . Hyperglycemia 01/06/2015  . Hyperlipemia   . Hypertension   . IBS (irritable bowel syndrome)   . Kidney stones   . Left hand pain 07/21/2017  . Measles as a child  . Medicare annual wellness visit, subsequent 06/26/2015  . Mumps as a child  . Otitis externa 12/10/2013  . Seasonal allergies    some asthma  . Stroke ([place])   . Unspecified asthma(493.90) 12/29/2013   Past Surgical History:  Procedure Laterality Date  . COLONOSCOPY    . EP IMPLANTABLE DEVICE N/A 10/16/2016   Procedure: Loop Recorder Insertion;  Surgeon: Thompson Grayer, MD;  Location: [facility] LAB;  Service: Cardiovascular;  Laterality: N/A;  . LITHOTRIPSY     multiple times  . PROSTATE SURGERY  08/2017  . TEE WITHOUT CARDIOVERSION N/A 09/22/2014   Procedure: TRANSESOPHAGEAL ECHOCARDIOGRAM (TEE);  Surgeon: Josue Hector, MD;  Location[facility] ENDOSCOPY;  Service: Cardiovascular;  Laterality: N/A;  . WISDOM TOOTH EXTRACTION     Family History  Problem Relation Age of Onset  . Hypertension Mother   . Hyperlipidemia Mother   .  Fibromyalgia Mother   . Arthritis Mother        rheumatoid  . Diabetes Sister        type 2  . Hyperlipidemia Brother   . Hypertension Brother   . Ulcers Father 36       Bleeding Ulcers  . Kidney Stones Daughter   . Asthma Daughter   . Healthy Son   . Cancer Maternal Grandfather        skin ?  . Stroke Maternal Aunt   . Cancer Maternal Uncle        prostate   Social History   Socioeconomic History  . Marital status: Married    Spouse name: Paul Drake  . Number of children: 2  . Years of education: college  . Highest education level: Not on file  Occupational History    Comment: retired  Scientific laboratory technician  . Financial  resource strain: Not on file  . Food insecurity:    Worry: Not on file    Inability: Not on file  . Transportation needs:    Medical: Not on file    Non-medical: Not on file  Tobacco Use  . Smoking status: Never Smoker  . Smokeless tobacco: Never Used  Substance and Sexual Activity  . Alcohol use: Yes    Alcohol/week: 1.0 standard drinks    Types: 1 Shots of liquor per week    Comment: 2 old fashions per evening  . Drug use: No  . 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 Physical activity:    Days per week: Not on file    Minutes per session: Not on file  . Stress: Not on file  Relationships  . Social connections:    Talks on phone: Not on file    Gets together: Not on file    Attends religious service: Not on file    Active member of club or organization: Not on file    Attends meetings of clubs or organizations: Not on file    Relationship status: Not on file  Other Topics Concern  . Not on file  Social History Narrative   Patient consumes 3 cups of caffeine daily   Left handed   Lives at home with his wife. They are living in their daughter's house to help to help take care of their granddaughter.      Activities of Daily Living In your present state of health, do you have any difficulty performing the following activities: 05/16/2018  Hearing? N  Vision? N  Difficulty concentrating or making decisions? N  Walking or climbing stairs? N  Dressing or bathing? N  Doing errands, shopping? N  Preparing Food and eating ? N  Using the Toilet? N  In the past six months, have you accidently leaked urine? N  Do you have problems with loss of bowel control? N  Managing your Medications? N  Managing your Finances? N  Housekeeping or managing your Housekeeping? N  Some recent data might be hidden    Patient Care Team: Mosie Lukes, MD as PCP - General (Family Medicine) Rosalin Hawking, MD as  Consulting Physician (Neurology) Christy Sartorius, MD as Referring Physician (Urology) Ladene Artist, MD as Consulting Physician (Gastroenterology) Sheryn Bison, MD as Referring Physician (Dermatology)   Assessment:   This is a routine wellness examination for Paul Drake. Physical assessment deferred to PCP.   Exercise Activities and Dietary recommendations Current  Exercise Habits: Home exercise routine, Type of exercise: strength training/weights;walking, Time (Minutes): > 60, Frequency (Times/Week): 5, Weekly Exercise (Minutes/Week): 0, Intensity: Mild, Exercise limited by: None identified   Diet (meal preparation, eat out, water intake, caffeinated beverages, dairy products, fruits and vegetables): in general, a "healthy" diet     Goals    . Maintain healthy lifestyle.       Fall Risk Fall Risk  05/16/2018 01/27/2018 01/14/2018 01/16/2017 10/15/2016  Falls in the past year? Yes Yes Yes No No  Number falls in past yr: 2 or more 2 or more 2 or more - -  Injury with Fall? Yes No No - -  Risk Factor Category  - High Fall Risk - - -  Risk for fall due to : History of fall(s);Impaired balance/gait Other (Comment);Impaired balance/gait Impaired mobility - -  Risk for fall due to: Comment - vertigo, weakness L leg, pt denies assistive device - - -  Follow up Education provided;Falls prevention discussed Education provided;Falls evaluation completed Falls prevention discussed - -    Depression Screen PHQ 2/9 Scores 05/16/2018 01/14/2018 10/15/2016 02/03/2016  PHQ - 2 Score 0 0 0 0    Cognitive Function Ad8 score reviewed for issues:  Issues making decisions:no  Less interest in hobbies / activities:no  Repeats questions, stories (family complaining):no  Trouble using ordinary gadgets (microwave, computer, phone):no  Forgets the month or year: no  Mismanaging finances: no  Remembering appts:no  Daily problems with thinking and/or memory:no Ad8 score is=0  MMSE - Mini Mental  State Exam 10/15/2016  Orientation to time 5  Orientation to Place 5  Registration 3  Attention/ Calculation 5  Recall 3  Language- name 2 objects 2  Language- repeat 1  Language- follow 3 step command 3  Language- read & follow direction 1  Write a sentence 1  Copy design 1  Total score 30        Immunization History  Administered Date(s) Administered  . Influenza, High Dose Seasonal PF 07/01/2017  . Influenza,inj,Quad PF,6+ Mos 05/17/2014, 05/20/2015, 07/05/2016  . Influenza-Unspecified 06/17/2013  . Pneumococcal Conjugate-13 10/15/2016  . Pneumococcal-Unspecified 06/17/2013  . Tdap 11/11/2013      Screening Tests Health Maintenance  Topic Date Due  . INFLUENZA VACCINE  06/17/2018 (Originally 04/17/2018)  . COLONOSCOPY  10/01/2023  . TETANUS/TDAP  11/12/2023  . Hepatitis C Screening  Completed  . PNA vac Low Risk Adult  Completed        Plan:    Please schedule your next medicare wellness visit with me in 1 yr.  Continue to eat heart healthy diet (full of fruits, vegetables, whole grains, lean protein, water--limit salt, fat, and sugar intake) and increase physical activity as tolerated.  Bring a copy of your living will and/or healthcare power of attorney to your next office visit.  Please find healthy ways to manage your stress as discussed such as golf, exercise, and or painting.   I have personally reviewed and noted the following in the patient's chart:   . Medical and social history . Use of alcohol, tobacco or illicit drugs  . Current medications and supplements . Functional ability and status . Nutritional status . Physical activity . Advanced directives . List of other physicians . Hospitalizations, surgeries, and ER visits in previous 12 months . Vitals . Screenings to include cognitive, depression, and falls . Referrals and appointments  In addition, I have reviewed and discussed with patient certain preventive protocols, quality metrics, and  best  practice recommendations. A written personalized care plan for preventive services as well as general preventive health recommendations were provided to patient.     Shela Nevin, [place]  05/16/2018  Medical screening examination/treatment was performed by qualified clinical staff member and as supervising physician I was immediately available for consultation/collaboration. I have reviewed documentation and agree with assessment and plan.  Penni Homans, MD

## 2018-05-16 ENCOUNTER — Ambulatory Visit (INDEPENDENT_AMBULATORY_CARE_PROVIDER_SITE_OTHER): Payer: Medicare Other | Admitting: *Deleted

## 2018-05-16 ENCOUNTER — Encounter: Payer: Self-pay | Admitting: *Deleted

## 2018-05-16 ENCOUNTER — Ambulatory Visit (INDEPENDENT_AMBULATORY_CARE_PROVIDER_SITE_OTHER): Payer: Medicare Other | Admitting: Family Medicine

## 2018-05-16 VITALS — BP 152/90 | HR 65 | Ht 68.0 in | Wt 165.0 lb

## 2018-05-16 VITALS — BP 134/76 | HR 65 | Temp 98.6°F | Resp 18 | Ht 68.0 in | Wt 165.0 lb

## 2018-05-16 DIAGNOSIS — E349 Endocrine disorder, unspecified: Secondary | ICD-10-CM | POA: Diagnosis not present

## 2018-05-16 DIAGNOSIS — R7989 Other specified abnormal findings of blood chemistry: Secondary | ICD-10-CM

## 2018-05-16 DIAGNOSIS — I10 Essential (primary) hypertension: Secondary | ICD-10-CM | POA: Diagnosis not present

## 2018-05-16 DIAGNOSIS — M67432 Ganglion, left wrist: Secondary | ICD-10-CM

## 2018-05-16 DIAGNOSIS — R739 Hyperglycemia, unspecified: Secondary | ICD-10-CM

## 2018-05-16 DIAGNOSIS — N529 Male erectile dysfunction, unspecified: Secondary | ICD-10-CM

## 2018-05-16 DIAGNOSIS — Z0000 Encounter for general adult medical examination without abnormal findings: Secondary | ICD-10-CM | POA: Diagnosis not present

## 2018-05-16 DIAGNOSIS — E7801 Familial hypercholesterolemia: Secondary | ICD-10-CM | POA: Diagnosis not present

## 2018-05-16 DIAGNOSIS — I639 Cerebral infarction, unspecified: Secondary | ICD-10-CM | POA: Diagnosis not present

## 2018-05-16 LAB — CBC
HEMATOCRIT: 38.2 % — AB (ref 39.0–52.0)
Hemoglobin: 12.8 g/dL — ABNORMAL LOW (ref 13.0–17.0)
MCHC: 33.6 g/dL (ref 30.0–36.0)
MCV: 98.2 fl (ref 78.0–100.0)
PLATELETS: 151 10*3/uL (ref 150.0–400.0)
RBC: 3.89 Mil/uL — ABNORMAL LOW (ref 4.22–5.81)
RDW: 13.3 % (ref 11.5–15.5)
WBC: 4.4 10*3/uL (ref 4.0–10.5)

## 2018-05-16 LAB — COMPREHENSIVE METABOLIC PANEL
ALT: 10 U/L (ref 0–53)
AST: 17 U/L (ref 0–37)
Albumin: 4.3 g/dL (ref 3.5–5.2)
Alkaline Phosphatase: 55 U/L (ref 39–117)
BILIRUBIN TOTAL: 0.9 mg/dL (ref 0.2–1.2)
BUN: 18 mg/dL (ref 6–23)
CALCIUM: 9.3 mg/dL (ref 8.4–10.5)
CO2: 31 meq/L (ref 19–32)
Chloride: 102 mEq/L (ref 96–112)
Creatinine, Ser: 1.08 mg/dL (ref 0.40–1.50)
GFR: 71.05 mL/min (ref >60.00)
Glucose, Bld: 99 mg/dL (ref 70–99)
POTASSIUM: 4.3 meq/L (ref 3.5–5.1)
Sodium: 138 mEq/L (ref 135–145)
Total Protein: 6.5 g/dL (ref 6.0–8.3)

## 2018-05-16 LAB — LIPID PANEL
CHOL/HDL RATIO: 2
Cholesterol: 150 mg/dL (ref 0–200)
HDL: 73.1 mg/dL (ref >39.00)
LDL Cholesterol: 64 mg/dL (ref 0–99)
NonHDL: 76.78
Triglycerides: 65 mg/dL (ref 0.0–149.0)
VLDL: 13 mg/dL (ref 0.0–40.0)

## 2018-05-16 LAB — TESTOSTERONE: Testosterone: 917.39 ng/dL — ABNORMAL HIGH (ref 300.00–890.00)

## 2018-05-16 LAB — HEMOGLOBIN A1C: Hgb A1c MFr Bld: 5.3 % (ref 4.6–6.5)

## 2018-05-16 LAB — TSH: TSH: 1.28 u[IU]/mL (ref 0.35–4.50)

## 2018-05-16 NOTE — Assessment & Plan Note (Signed)
hgba1c acceptable, minimize simple carbs. Increase exercise as tolerated.  

## 2018-05-16 NOTE — Assessment & Plan Note (Signed)
Well controlled, no changes to meds. Encouraged heart healthy diet such as the DASH diet and exercise as tolerated.  °

## 2018-05-16 NOTE — Assessment & Plan Note (Signed)
Encouraged heart healthy diet, increase exercise, avoid trans fats, consider a krill oil cap daily 

## 2018-05-16 NOTE — Patient Instructions (Addendum)
Please schedule your next medicare wellness visit with me in 1 yr.  Continue to eat heart healthy diet (full of fruits, vegetables, whole grains, lean protein, water--limit salt, fat, and sugar intake) and increase physical activity as tolerated.  Bring a copy of your living will and/or healthcare power of attorney to your next office visit.  Please find healthy ways to manage your stress as discussed such as golf, exercise, and or painting.    Paul Drake , Thank you for taking time to come for your Medicare Wellness Visit. I appreciate your ongoing commitment to your health goals. Please review the following plan we discussed and let me know if I can assist you in the future.   These are the goals we discussed: Goals    . Maintain healthy lifestyle.       This is a list of the screening recommended for you and due dates:  Health Maintenance  Topic Date Due  . Flu Shot  06/17/2018*  . Colon Cancer Screening  10/01/2023  . Tetanus Vaccine  11/12/2023  .  Hepatitis C: One time screening is recommended by Center for Disease Control  (CDC) for  adults born from [birth year] through 1967.   Completed  . Pneumonia vaccines  Completed  *Topic was postponed. The date shown is not the original due date.    Stress and Stress Management Stress is a normal reaction to life events. It is what you feel when life demands more than you are used to or more than you can handle. Some stress can be useful. For example, the stress reaction can help you catch the last bus of the day, study for a test, or meet a deadline at work. But stress that occurs too often or for too long can cause problems. It can affect your emotional health and interfere with relationships and normal daily activities. Too much stress can weaken your immune system and increase your risk for physical illness. If you already have a medical problem, stress can make it worse. What are the causes? All sorts of life events may cause stress. An  event that causes stress for one person may not be stressful for another person. Major life events commonly cause stress. These may be positive or negative. Examples include losing your job, moving into a new home, getting married, having a baby, or losing a loved one. Less obvious life events may also cause stress, especially if they occur day after day or in combination. Examples include working long hours, driving in traffic, caring for children, being in debt, or being in a difficult relationship. What are the signs or symptoms? Stress may cause emotional symptoms including, the following:  Anxiety. This is feeling worried, afraid, on edge, overwhelmed, or out of control.  Anger. This is feeling irritated or impatient.  Depression. This is feeling sad, down, helpless, or guilty.  Difficulty focusing, remembering, or making decisions.  Stress may cause physical symptoms, including the following:  Aches and pains. These may affect your head, neck, back, stomach, or other areas of your body.  Tight muscles or clenched jaw.  Low energy or trouble sleeping.  Stress may cause unhealthy behaviors, including the following:  Eating to feel better (overeating) or skipping meals.  Sleeping too little, too much, or both.  Working too much or putting off tasks (procrastination).  Smoking, drinking alcohol, or using drugs to feel better.  How is this diagnosed? Stress is diagnosed through an assessment by your health care provider. Your  health care provider will ask questions about your symptoms and any stressful life events.Your health care provider will also ask about your medical history and may order blood tests or other tests. Certain medical conditions and medicine can cause physical symptoms similar to stress. Mental illness can cause emotional symptoms and unhealthy behaviors similar to stress. Your health care provider may refer you to a mental health professional for further  evaluation. How is this treated? Stress management is the recommended treatment for stress.The goals of stress management are reducing stressful life events and coping with stress in healthy ways. Techniques for reducing stressful life events include the following:  Stress identification. Self-monitor for stress and identify what causes stress for you. These skills may help you to avoid some stressful events.  Time management. Set your priorities, keep a calendar of events, and learn to say "no." These tools can help you avoid making too many commitments.  Techniques for coping with stress include the following:  Rethinking the problem. Try to think realistically about stressful events rather than ignoring them or overreacting. Try to find the positives in a stressful situation rather than focusing on the negatives.  Exercise. Physical exercise can release both physical and emotional tension. The key is to find a form of exercise you enjoy and do it regularly.  Relaxation techniques. These relax the body and mind. Examples include yoga, meditation, tai chi, biofeedback, deep breathing, progressive muscle relaxation, listening to music, being out in nature, journaling, and other hobbies. Again, the key is to find one or more that you enjoy and can do regularly.  Healthy lifestyle. Eat a balanced diet, get plenty of sleep, and do not smoke. Avoid using alcohol or drugs to relax.  Strong support network. Spend time with family, friends, or other people you enjoy being around.Express your feelings and talk things over with someone you trust.  Counseling or talktherapy with a mental health professional may be helpful if you are having difficulty managing stress on your own. Medicine is typically not recommended for the treatment of stress.Talk to your health care provider if you think you need medicine for symptoms of stress. Follow these instructions at home:  Keep all follow-up visits as  directed by your health care provider.  Take all medicines as directed by your health care provider. Contact a health care provider if:  Your symptoms get worse or you start having new symptoms.  You feel overwhelmed by your problems and can no longer manage them on your own. Get help right away if:  You feel like hurting yourself or someone else. This information is not intended to replace advice given to you by your health care provider. Make sure you discuss any questions you have with your health care provider. Document Released: 02/27/2001 Document Revised: 02/09/2016 Document Reviewed: 04/28/2013 Elsevier Interactive Patient Education  2017 [employer].

## 2018-05-16 NOTE — Patient Instructions (Signed)
shingrix is the new shingles shot 2 shots over 2-6 months at the pharmacy Hypertension Hypertension is another name for high blood pressure. High blood pressure forces your heart to work harder to pump blood. This can cause problems over time. There are two numbers in a blood pressure reading. There is a top number (systolic) over a bottom number (diastolic). It is best to have a blood pressure below 120/80. Healthy choices can help lower your blood pressure. You may need medicine to help lower your blood pressure if:  Your blood pressure cannot be lowered with healthy choices.  Your blood pressure is higher than 130/80.  Follow these instructions at home: Eating and drinking  If directed, follow the DASH eating plan. This diet includes: ? Filling half of your plate at each meal with fruits and vegetables. ? Filling one quarter of your plate at each meal with whole grains. Whole grains include whole wheat pasta, brown rice, and whole grain bread. ? Eating or drinking low-fat dairy products, such as skim milk or low-fat yogurt. ? Filling one quarter of your plate at each meal with low-fat (lean) proteins. Low-fat proteins include fish, skinless chicken, eggs, beans, and tofu. ? Avoiding fatty meat, cured and processed meat, or chicken with skin. ? Avoiding premade or processed food.  Eat less than 1,500 mg of salt (sodium) a day.  Limit alcohol use to no more than 1 drink a day for nonpregnant women and 2 drinks a day for men. One drink equals 12 oz of beer, 5 oz of wine, or 1 oz of hard liquor. Lifestyle  Work with your doctor to stay at a healthy weight or to lose weight. Ask your doctor what the best weight is for you.  Get at least 30 minutes of exercise that causes your heart to beat faster (aerobic exercise) most days of the week. This may include walking, swimming, or biking.  Get at least 30 minutes of exercise that strengthens your muscles (resistance exercise) at least 3 days a  week. This may include lifting weights or pilates.  Do not use any products that contain nicotine or tobacco. This includes cigarettes and e-cigarettes. If you need help quitting, ask your doctor.  Check your blood pressure at home as told by your doctor.  Keep all follow-up visits as told by your doctor. This is important. Medicines  Take over-the-counter and prescription medicines only as told by your doctor. Follow directions carefully.  Do not skip doses of blood pressure medicine. The medicine does not work as well if you skip doses. Skipping doses also puts you at risk for problems.  Ask your doctor about side effects or reactions to medicines that you should watch for. Contact a doctor if:  You think you are having a reaction to the medicine you are taking.  You have headaches that keep coming back (recurring).  You feel dizzy.  You have swelling in your ankles.  You have trouble with your vision. Get help right away if:  You get a very bad headache.  You start to feel confused.  You feel weak or numb.  You feel faint.  You get very bad pain in your: ? Chest. ? Belly (abdomen).  You throw up (vomit) more than once.  You have trouble breathing. Summary  Hypertension is another name for high blood pressure.  Making healthy choices can help lower blood pressure. If your blood pressure cannot be controlled with healthy choices, you may need to take medicine. This  information is not intended to replace advice given to you by your health care provider. Make sure you discuss any questions you have with your health care provider. Document Released: 02/20/2008 Document Revised: 08/01/2016 Document Reviewed: 08/01/2016 Elsevier Interactive Patient Education  [employer].

## 2018-05-19 DIAGNOSIS — E349 Endocrine disorder, unspecified: Secondary | ICD-10-CM | POA: Insufficient documentation

## 2018-05-19 DIAGNOSIS — R7989 Other specified abnormal findings of blood chemistry: Secondary | ICD-10-CM | POA: Insufficient documentation

## 2018-05-19 DIAGNOSIS — M67432 Ganglion, left wrist: Secondary | ICD-10-CM | POA: Insufficient documentation

## 2018-05-19 NOTE — Assessment & Plan Note (Signed)
Asymptomatic and he declines eferral at this time. He will let us know if it changes or he is ready for referral. Apply ice and lidocaine gel twice a day.

## 2018-05-19 NOTE — Assessment & Plan Note (Signed)
Offered referral to urology for futher evaluation

## 2018-05-19 NOTE — Progress Notes (Signed)
Subjective:    Patient ID: Paul Drake, male    DOB: [birth year], 72 y.o.   MRN: [id number]  No chief complaint on file.   HPI Patient is in today for follow up. No recent febrile illness or hospitalizations. He recent fall or trauma. He is noting a firm raised lesion on left wrist. No recent febrile illness or hospitalizations. Denies CP/palp/SOB/HA/congestion/fevers/GI or GU c/o. Taking meds as prescribed.  Past Medical History:  Diagnosis Date  . Allergic state 12/29/2013  . Arthritis   . Barrett's esophagus   . BPH (benign prostatic hyperplasia)   . Chicken pox   . CVA (cerebral infarction)   . Depression   . GERD (gastroesophageal reflux disease)   . HTN (hypertension), benign 04/01/2015  . Hyperglycemia 01/06/2015  . Hyperlipemia   . Hypertension   . IBS (irritable bowel syndrome)   . Kidney stones   . Left hand pain 07/21/2017  . Measles as a child  . Medicare annual wellness visit, subsequent 06/26/2015  . Mumps as a child  . Otitis externa 12/10/2013  . Seasonal allergies    some asthma  . Stroke ([place])   . Unspecified asthma(493.90) 12/29/2013    Past Surgical History:  Procedure Laterality Date  . COLONOSCOPY    . EP IMPLANTABLE DEVICE N/A 10/16/2016   Procedure: Loop Recorder Insertion;  Surgeon: Thompson Grayer, MD;  Location: [facility];  Service: Cardiovascular;  Laterality: N/A;  . LITHOTRIPSY     multiple times  . PROSTATE SURGERY  08/2017  . TEE WITHOUT CARDIOVERSION N/A 09/22/2014   Procedure: TRANSESOPHAGEAL ECHOCARDIOGRAM (TEE);  Surgeon: Josue Hector, MD;  Location: [facility] ENDOSCOPY;  Service: Cardiovascular;  Laterality: N/A;  . WISDOM TOOTH EXTRACTION      Family History  Problem Relation Age of Onset  . Hypertension Mother   . Hyperlipidemia Mother   . Fibromyalgia Mother   . Arthritis Mother        rheumatoid  . Diabetes Sister        type 2  . Hyperlipidemia Brother   . Hypertension Brother   . Ulcers Father 36       Bleeding Ulcers    . Kidney Stones Daughter   . Asthma Daughter   . Healthy Son   . Cancer Maternal Grandfather        skin ?  . Stroke Maternal Aunt   . Cancer Maternal Uncle        prostate    Social History   Socioeconomic History  . Marital status: Married    Spouse name: Webb Silversmith  . Number of children: 2  . Years of education: college  . Highest education level: Not on file  Occupational History    Comment: retired  Scientific laboratory technician  . Financial resource strain: Not on file  . Food insecurity:    Worry: Not on file    Inability: Not on file  . Transportation needs:    Medical: Not on file    Non-medical: Not on file  Tobacco Use  . Smoking status: Never Smoker  . Smokeless tobacco: Never Used  Substance and Sexual Activity  . Alcohol use: Yes    Alcohol/week: 1.0 standard drinks    Types: 1 Shots of liquor per week    Comment: 2 old fashions per evening  . Drug use: No  . 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 Physical activity:  Days per week: Not on file    Minutes per session: Not on file  . Stress: Not on file  Relationships  . Social connections:    Talks on phone: Not on file    Gets together: Not on file    Attends religious service: Not on file    Active member of club or organization: Not on file    Attends meetings of clubs or organizations: Not on file    Relationship status: Not on file  . Intimate partner violence:    Fear of current or ex partner: Not on file    Emotionally abused: Not on file    Physically abused: Not on file    Forced sexual activity: Not on file  Other Topics Concern  . Not on file  Social History Narrative   Patient consumes 3 cups of caffeine daily   Left handed   Lives at home with his wife. They are living in their daughter's house to help to help take care of their granddaughter.      Allergies  Allergen Reactions  . Molds & Smuts Other (See Comments)  .  Pollen Extract Other (See Comments)    Runny nose,itchy eyes    Review of Systems  Constitutional: Negative for fever and malaise/fatigue.  HENT: Negative for congestion.   Eyes: Negative for blurred vision.  Respiratory: Negative for shortness of breath.   Cardiovascular: Negative for chest pain, palpitations and leg swelling.  Gastrointestinal: Negative for abdominal pain, blood in stool and nausea.  Genitourinary: Negative for dysuria and frequency.  Musculoskeletal: Positive for joint pain. Negative for falls.  Skin: Negative for rash.  Neurological: Negative for dizziness, loss of consciousness and headaches.  Endo/Heme/Allergies: Negative for environmental allergies.  Psychiatric/Behavioral: Negative for depression. The patient is not nervous/anxious.        Objective:    Physical Exam  Constitutional: He is oriented to person, place, and time. He appears well-developed and well-nourished. No distress.  HENT:  Head: Normocephalic and atraumatic.  Nose: Nose normal.  Eyes: Right eye exhibits no discharge. Left eye exhibits no discharge.  Neck: Normal range of motion. Neck supple.  Cardiovascular: Normal rate and regular rhythm.  No murmur heard. Pulmonary/Chest: Effort normal and breath sounds normal.  Abdominal: Soft. Bowel sounds are normal. There is no tenderness.  Musculoskeletal: He exhibits no edema.  5 mm firm cyst on left wrist. No warmth or redness.   Neurological: He is alert and oriented to person, place, and time.  Skin: Skin is warm and dry.  Psychiatric: He has a normal mood and affect.  Nursing note and vitals reviewed.   BP 134/76   Pulse 65   Temp 98.6 F (37 C) (Oral)   Resp 18   Ht 5\' 8"  (1.727 m)   Wt 165 lb (74.8 kg)   SpO2 98%   BMI 25.09 kg/m  Wt Readings from Last 3 Encounters:  05/16/18 165 lb (74.8 kg)  05/16/18 165 lb (74.8 kg)  05/01/18 164 lb 9.6 oz (74.7 kg)     Lab Results  Component Value Date   WBC 4.4 05/16/2018   HGB  12.8 (L) 05/16/2018   HCT 38.2 (L) 05/16/2018   PLT 151.0 05/16/2018   GLUCOSE 99 05/16/2018   CHOL 150 05/16/2018   TRIG 65.0 05/16/2018   HDL 73.10 05/16/2018   LDLDIRECT 76.0 12/31/2014   LDLCALC 64 05/16/2018   ALT 10 05/16/2018   AST 17 05/16/2018   NA 138 05/16/2018  K 4.3 05/16/2018   CL 102 05/16/2018   CREATININE 1.08 05/16/2018   BUN 18 05/16/2018   CO2 31 05/16/2018   TSH 1.28 05/16/2018   INR 0.98 09/20/2014   HGBA1C 5.3 05/16/2018    Lab Results  Component Value Date   TSH 1.28 05/16/2018   Lab Results  Component Value Date   WBC 4.4 05/16/2018   HGB 12.8 (L) 05/16/2018   HCT 38.2 (L) 05/16/2018   MCV 98.2 05/16/2018   PLT 151.0 05/16/2018   Lab Results  Component Value Date   NA 138 05/16/2018   K 4.3 05/16/2018   CO2 31 05/16/2018   GLUCOSE 99 05/16/2018   BUN 18 05/16/2018   CREATININE 1.08 05/16/2018   BILITOT 0.9 05/16/2018   ALKPHOS 55 05/16/2018   AST 17 05/16/2018   ALT 10 05/16/2018   PROT 6.5 05/16/2018   ALBUMIN 4.3 05/16/2018   CALCIUM 9.3 05/16/2018   ANIONGAP 7 09/14/2016   GFR 71.05 05/16/2018   Lab Results  Component Value Date   CHOL 150 05/16/2018   Lab Results  Component Value Date   HDL 73.10 05/16/2018   Lab Results  Component Value Date   LDLCALC 64 05/16/2018   Lab Results  Component Value Date   TRIG 65.0 05/16/2018   Lab Results  Component Value Date   CHOLHDL 2 05/16/2018   Lab Results  Component Value Date   HGBA1C 5.3 05/16/2018       Assessment & Plan:   Problem List Items Addressed This Visit    Essential hypertension    Well controlled, no changes to meds. Encouraged heart healthy diet such as the DASH diet and exercise as tolerated.       Relevant Orders   CBC (Completed)   Comprehensive metabolic panel (Completed)   TSH (Completed)   Hyperglycemia    hgba1c acceptable, minimize simple carbs. Increase exercise as tolerated.       Relevant Orders   Hemoglobin A1c (Completed)    HLD (hyperlipidemia)    Encouraged heart healthy diet, increase exercise, avoid trans fats, consider a krill oil cap daily      Relevant Orders   Lipid panel (Completed)   Ganglion cyst of dorsum of left wrist    Asymptomatic and he declines eferral at this time. He will let us know if it changes or he is ready for referral. Apply ice and lidocaine gel twice a day.      Elevated testosterone level in male    Offered referral to urology for futher evaluation       Other Visit Diagnoses    Erectile dysfunction, unspecified erectile dysfunction type    -  Primary   Relevant Orders   Testosterone (Completed)      I am having [name] maintain his multivitamins ther. w/minerals, Omega-3 Fatty Acids (OMEGA-3 PO), Simethicone (GAS-X PO), fluticasone, gabapentin, albuterol, simvastatin, losartan, clopidogrel, pantoprazole, clonazePAM, and DULoxetine.  No orders of the defined types were placed in this encounter.    Penni Homans, MD

## 2018-05-30 ENCOUNTER — Ambulatory Visit (INDEPENDENT_AMBULATORY_CARE_PROVIDER_SITE_OTHER): Payer: Medicare Other | Admitting: *Deleted

## 2018-05-30 DIAGNOSIS — I639 Cerebral infarction, unspecified: Secondary | ICD-10-CM | POA: Diagnosis not present

## 2018-05-31 NOTE — Progress Notes (Signed)
Carelink Summary Report / Loop Recorder 

## 2018-06-04 ENCOUNTER — Other Ambulatory Visit: Payer: Self-pay | Admitting: Neurology

## 2018-06-05 LAB — CUP PACEART REMOTE DEVICE CHECK
MDC IDC PG IMPLANT DT: 20180130
MDC IDC SESS DTM: 20190812013731

## 2018-06-14 LAB — CUP PACEART REMOTE DEVICE CHECK
Implantable Pulse Generator Implant Date: 20180130
MDC IDC SESS DTM: 20190914020602

## 2018-06-16 ENCOUNTER — Other Ambulatory Visit: Payer: Self-pay | Admitting: Family Medicine

## 2018-06-16 ENCOUNTER — Ambulatory Visit (AMBULATORY_SURGERY_CENTER): Payer: Medicare Other | Admitting: Gastroenterology

## 2018-06-16 ENCOUNTER — Encounter: Payer: Self-pay | Admitting: Gastroenterology

## 2018-06-16 VITALS — BP 130/81 | HR 67 | Temp 98.2°F | Resp 11 | Ht 68.0 in | Wt 165.0 lb

## 2018-06-16 DIAGNOSIS — Z8673 Personal history of transient ischemic attack (TIA), and cerebral infarction without residual deficits: Secondary | ICD-10-CM | POA: Diagnosis not present

## 2018-06-16 DIAGNOSIS — K2900 Acute gastritis without bleeding: Secondary | ICD-10-CM | POA: Diagnosis not present

## 2018-06-16 DIAGNOSIS — K2270 Barrett's esophagus without dysplasia: Secondary | ICD-10-CM

## 2018-06-16 DIAGNOSIS — K3189 Other diseases of stomach and duodenum: Secondary | ICD-10-CM | POA: Diagnosis not present

## 2018-06-16 DIAGNOSIS — J45909 Unspecified asthma, uncomplicated: Secondary | ICD-10-CM | POA: Diagnosis not present

## 2018-06-16 DIAGNOSIS — K219 Gastro-esophageal reflux disease without esophagitis: Secondary | ICD-10-CM | POA: Diagnosis not present

## 2018-06-16 DIAGNOSIS — K208 Other esophagitis: Secondary | ICD-10-CM | POA: Diagnosis not present

## 2018-06-16 DIAGNOSIS — I10 Essential (primary) hypertension: Secondary | ICD-10-CM | POA: Diagnosis not present

## 2018-06-16 MED ORDER — SODIUM CHLORIDE 0.9 % IV SOLN: 500.0000 mL | Freq: Once | INTRAVENOUS | Status: DC

## 2018-06-16 NOTE — Op Note (Signed)
[place] Patient Name: Paul Drake Procedure Date: 06/16/2018 9:47 AM MRN: [id number] Endoscopist: Ladene Artist , MD Age: 72 Referring MD:  Date of Birth: 1946/07/11 Gender: Male Account #: [id number] Procedure:                Upper GI endoscopy Indications:              Surveillance for malignancy due to personal history                            of Barrett's esophagus Medicines:                Monitored Anesthesia Care Procedure:                Pre-Anesthesia Assessment:                           - Prior to the procedure, a History and Physical                            was performed, and patient medications and                            allergies were reviewed. The patient's tolerance of                            previous anesthesia was also reviewed. The risks                            and benefits of the procedure and the sedation                            options and risks were discussed with the patient.                            All questions were answered, and informed consent                            was obtained. Prior Anticoagulants: The patient has                            taken Plavix (clopidogrel), last dose was 5 days                            prior to procedure. ASA Grade Assessment: III - A                            patient with severe systemic disease. After                            reviewing the risks and benefits, the patient was                            deemed in satisfactory condition to undergo the  procedure.                           After obtaining informed consent, the endoscope was                            passed under direct vision. Throughout the                            procedure, the patient's blood pressure, pulse, and                            oxygen saturations were monitored continuously. The                            Model GIF-[id number]) scope was introduced               through the mouth, and advanced to the second part                            of duodenum. The upper GI endoscopy was                            accomplished without difficulty. The patient                            tolerated the procedure well. Scope In: Scope Out: Findings:                 There were esophageal mucosal changes suspicious                            for short-segment Barrett's esophagus present in                            the distal esophagus. The maximum longitudinal                            extent of these mucosal changes was 1 cm in length.                            Mucosa was biopsied with a cold forceps for                            histology in a targeted manner at intervals of 0.5                            cm in the lower third of the esophagus. One                            specimen bottle was sent to pathology.                           The exam of the esophagus was otherwise normal.  Patchy moderately erythematous mucosa without                            bleeding was found in the gastric body and in the                            gastric antrum. Biopsies were taken with a cold                            forceps for histology.                           A small hiatal hernia was present.                           The exam of the stomach was otherwise normal.                           The duodenal bulb and second portion of the                            duodenum were normal. Complications:            No immediate complications. Estimated Blood Loss:     Estimated blood loss was minimal. Impression:               - Esophageal mucosal changes suspicious for                            short-segment Barrett's esophagus. Biopsied.                           - Erythematous mucosa in the gastric body and                            antrum. Biopsied.                           - Small hiatal hernia.                           -  Normal duodenal bulb and second portion of the                            duodenum. Recommendation:           - Patient has a contact number available for                            emergencies. The signs and symptoms of potential                            delayed complications were discussed with the                            patient. Return to normal activities tomorrow.  Written discharge instructions were provided to the                            patient.                           - Resume previous diet.                           - Continue present medications.                           - Await pathology results.                           - Repeat upper endoscopy in 3 years for                            surveillance if no dysplasia.                           - Change timing of pantoprazole to qpm (30 minutes                            before evening meal). If not effective in                            controlling symptoms then call us.                           - Resume Plavix (clopidogrel) at prior dose                            tomorrow. Refer to managing physician for further                            adjustment of therapy. Ladene Artist, MD 06/16/2018 10:18:38 AM This report has been signed electronically.

## 2018-06-16 NOTE — Progress Notes (Signed)
Pt's states no medical or surgical changes since previsit or office visit. 

## 2018-06-16 NOTE — Progress Notes (Signed)
Called to room to assist during endoscopic procedure.  Patient ID and intended procedure confirmed with present staff. Received instructions for my participation in the procedure from the performing physician.  

## 2018-06-16 NOTE — Patient Instructions (Signed)
*  Handouts given on GERD and Gastritis and Hiatal Hernia. Start taking Pantoprazole 30 minutes prior to evening meal.  YOU HAD AN ENDOSCOPIC PROCEDURE TODAY AT [facility]:   Refer to the procedure report that was given to you for any specific questions about what was found during the examination.  If the procedure report does not answer your questions, please call your gastroenterologist to clarify.  If you requested that your care partner not be given the details of your procedure findings, then the procedure report has been included in a sealed envelope for you to review at your convenience later.  YOU SHOULD EXPECT: Some feelings of bloating in the abdomen. Passage of more gas than usual.  Walking can help get rid of the air that was put into your GI tract during the procedure and reduce the bloating. If you had a lower endoscopy (such as a colonoscopy or flexible sigmoidoscopy) you may notice spotting of blood in your stool or on the toilet paper. If you underwent a bowel prep for your procedure, you may not have a normal bowel movement for a few days.  Please Note:  You might notice some irritation and congestion in your nose or some drainage.  This is from the oxygen used during your procedure.  There is no need for concern and it should clear up in a day or so.  SYMPTOMS TO REPORT IMMEDIATELY:    Following upper endoscopy (EGD)  Vomiting of blood or coffee ground material  New chest pain or pain under the shoulder blades  Painful or persistently difficult swallowing  New shortness of breath  Fever of 100F or higher  Black, tarry-looking stools  For urgent or emergent issues, a gastroenterologist can be reached at any hour by calling [phone].   DIET:  We do recommend a small meal at first, but then you may proceed to your regular diet.  Drink plenty of fluids but you should avoid alcoholic beverages for 24 hours.  ACTIVITY:  You should plan to take it easy for  the rest of today and you should NOT DRIVE or use heavy machinery until tomorrow (because of the sedation medicines used during the test).    FOLLOW UP: Our staff will call the number listed on your records the next business day following your procedure to check on you and address any questions or concerns that you may have regarding the information given to you following your procedure. If we do not reach you, we will leave a message.  However, if you are feeling well and you are not experiencing any problems, there is no need to return our call.  We will assume that you have returned to your regular daily activities without incident.  If any biopsies were taken you will be contacted by phone or by letter within the next 1-3 weeks.  Please call us at [phone] if you have not heard about the biopsies in 3 weeks.    SIGNATURES/CONFIDENTIALITY: You and/or your care partner have signed paperwork which will be entered into your electronic medical record.  These signatures attest to the fact that that the information above on your After Visit Summary has been reviewed and is understood.  Full responsibility of the confidentiality of this discharge information lies with you and/or your care-partner.

## 2018-06-16 NOTE — Progress Notes (Signed)
Loop monitor to right upper chest wall.

## 2018-06-16 NOTE — Progress Notes (Signed)
Report to PACU, RN, vss, BBS= Clear.  

## 2018-06-17 ENCOUNTER — Telehealth: Payer: Self-pay | Admitting: *Deleted

## 2018-06-17 NOTE — Telephone Encounter (Signed)
  Follow up Call-  Call back number 06/16/2018  Post procedure Call Back phone  # [phone]  Permission to leave phone message Yes  Some recent data might be hidden     Patient questions:  Do you have a fever, pain , or abdominal swelling? No. Pain Score  0 *  Have you tolerated food without any problems? Yes.    Have you been able to return to your normal activities? Yes.    Do you have any questions about your discharge instructions: Diet   No. Medications  No. Follow up visit  No.  Do you have questions or concerns about your Care? Yes.    Actions: * If pain score is 4 or above: No action needed, pain <4.

## 2018-06-30 ENCOUNTER — Encounter: Payer: Self-pay | Admitting: Gastroenterology

## 2018-07-01 ENCOUNTER — Telehealth: Payer: Self-pay | Admitting: Family Medicine

## 2018-07-01 NOTE — Telephone Encounter (Signed)
Can increase clonazepam to tid prn for now. Can send in #45 with 1 rf. Also can continue the Duloxetine 60 mg and add a 30 mg tab daily and then have him come in for evaluation in roughly 8-12 weeks.

## 2018-07-01 NOTE — Telephone Encounter (Signed)
Copied from [place] [id number]. Topic: Quick Communication - See Telephone Encounter >> Jul 01, 2018 10:33 AM Margot Ables wrote: CRM for notification. See Telephone encounter for: 07/01/18. Pt called stating he is dealing with situational anxiety/depression. He is already on an antidepressant but wondering if he needs another or adjustment. He states that he mother recently passed away and they are having a memorial this weekend in [place]. He will be leaving town on Friday 07/04/18. He also notes they are living with their daughter. On top of it the daughters husband has recently said he is leaving her. He doesn't know what to do. Pt declined appt at this time. Please advise.   [id number] - [place], [place] - [address] AT [address] [phone] (Phone) [phone] (Fax)

## 2018-07-01 NOTE — Telephone Encounter (Signed)
Please advise 

## 2018-07-02 ENCOUNTER — Ambulatory Visit (INDEPENDENT_AMBULATORY_CARE_PROVIDER_SITE_OTHER): Payer: Medicare Other | Admitting: *Deleted

## 2018-07-02 DIAGNOSIS — I639 Cerebral infarction, unspecified: Secondary | ICD-10-CM

## 2018-07-03 NOTE — Telephone Encounter (Signed)
Spoke with patient and let him know the changes.    I called the pharmacy to have them add the medication, so there would not no confusion

## 2018-07-03 NOTE — Progress Notes (Signed)
Carelink Summary Report / Loop Recorder 

## 2018-07-09 ENCOUNTER — Other Ambulatory Visit: Payer: Self-pay | Admitting: Family Medicine

## 2018-07-09 ENCOUNTER — Other Ambulatory Visit: Payer: Self-pay | Admitting: Neurology

## 2018-07-09 DIAGNOSIS — I6389 Other cerebral infarction: Secondary | ICD-10-CM

## 2018-07-14 LAB — CUP PACEART REMOTE DEVICE CHECK
MDC IDC PG IMPLANT DT: 20180130
MDC IDC SESS DTM: 20191017024112

## 2018-07-23 ENCOUNTER — Ambulatory Visit: Payer: Self-pay | Admitting: *Deleted

## 2018-07-23 NOTE — Telephone Encounter (Signed)
Patient reports having fell down a flight of stairs one week ago today. Stated he has a cut and bruising to the right side of face near the hairline. He is on Plavix but had no difficulty with the bleeding. He did not report this accident to his PCP nor was he seen at any medical facility. Stated his wife checked his pupils for a time which did respond to light. He has had a dull headache intermittently this week which took tylenol that helped. He has continued to have chest soreness just below and left of the right nipple and on his right side where there is bruising noted, when he turns to that side or takes a deep breath. Stated it is sore on the right with most all that he does with movement and an occasional sharp pain that last only seconds with movement. He denies N/V/sweating/SOB/weakness/Fever. He rates the chest soreness mild but the fact that it's not improving is bothersome. Reviewed symptoms that would require immediate emergency evaluation according to protocol. Hx of TIA, CVA, DVT's. Routing to office for further advice. Reason for Disposition . [1] Chest pain(s) lasting a few seconds AND [2] persists > 3 days . [1] Chest pain lasts > 5 minutes AND [2] occurred > 3 days ago (72 hours) AND [3] NO chest pain or cardiac symptoms now  Answer Assessment - Initial Assessment Questions 1. LOCATION: "Where does it hurt?"      Rt center near sternum at breast 2. RADIATION: "Does the pain go anywhere else?" (e.g., into neck, jaw, arms, back)     no 3. ONSET: "When did the chest pain begin?" (Minutes, hours or days)      One week ago 4. PATTERN "Does the pain come and go, or has it been constant since it started?"  "Does it get worse with exertion?"      Almost constant as in when he moves, takes a deep breath. 5. DURATION: "How long does it last" (e.g., seconds, minutes, hours)     Hits sharply then backs off.  6. SEVERITY: "How bad is the pain?"  (e.g., Scale 1-10; mild, moderate, or  severe)    - MILD (1-3): doesn't interfere with normal activities     - MODERATE (4-7): interferes with normal activities or awakens from sleep    - SEVERE (8-10): excruciating pain, unable to do any normal activities       mild 7. CARDIAC RISK FACTORS: "Do you have any history of heart problems or risk factors for heart disease?" (e.g., prior heart attack, angina; high blood pressure, diabetes, being overweight, high cholesterol, smoking, or strong family history of heart disease)     Hx of cva, tia. 8. PULMONARY RISK FACTORS: "Do you have any history of lung disease?"  (e.g., blood clots in lung, asthma, emphysema, birth control pills)    dvts 9. CAUSE: "What do you think is causing the chest pain?"     Fell head first down a flight of stairs 10. OTHER SYMPTOMS: "Do you have any other symptoms?" (e.g., dizziness, nausea, vomiting, sweating, fever, difficulty breathing, cough)       Dizziness constantly from dx of vertigo a few years ago. Denies all others 11. PREGNANCY: "Is there any chance you are pregnant?" "When was your last menstrual period?"       na  Protocols used: CHEST PAIN-A-AH

## 2018-07-23 NOTE — Telephone Encounter (Signed)
TC to patient. Day of the fall he had mild, rates it a  2-3 on the scale headache that resolved with tylenol. Had a mild headache the day afterwards, rates it a 2, again resolved with dose of tylenol. Monday, 5 days after fall he had a mild headache rating it a 2 on the scale. No ha yesterday or today as of yet. As reported in the assessment, he denies N/V. No vision changes. He has dizziness "out of balance feeling, constantly" which he has had for years, as a result of a stroke it feels the same nothing has changed regarding the out of balance feeling. Again, reviewed symptoms which would need immediate emergency evaluation. He understood and agreed.

## 2018-07-23 NOTE — Telephone Encounter (Signed)
FYI

## 2018-07-23 NOTE — Telephone Encounter (Signed)
Dull ha and sounds like he hit his face. Mechanism of injury respectable and on plavix. Sounds like he may need to go to ED. The ribs can wait but HA under circumstance vit  worrisome. Any nausea, vomiting, vision changes, dizziness then recommend going ahead and going to ED today rather than waiting. What level on ha 1-10 scale?

## 2018-07-24 ENCOUNTER — Ambulatory Visit (INDEPENDENT_AMBULATORY_CARE_PROVIDER_SITE_OTHER): Payer: Medicare Other | Admitting: Medical

## 2018-07-24 ENCOUNTER — Ambulatory Visit (HOSPITAL_BASED_OUTPATIENT_CLINIC_OR_DEPARTMENT_OTHER)
Admission: RE | Admit: 2018-07-24 | Discharge: 2018-07-24 | Disposition: A | Discharge status: Home / Self Care | Payer: Medicare Other | Source: Ambulatory Visit | Attending: Medical | Admitting: Medical

## 2018-07-24 ENCOUNTER — Encounter: Payer: Self-pay | Admitting: Medical

## 2018-07-24 VITALS — BP 131/70 | HR 76 | Temp 97.6°F | Resp 16 | Ht 68.0 in | Wt 167.6 lb

## 2018-07-24 DIAGNOSIS — S0990XA Unspecified injury of head, initial encounter: Secondary | ICD-10-CM | POA: Diagnosis not present

## 2018-07-24 DIAGNOSIS — R0781 Pleurodynia: Secondary | ICD-10-CM

## 2018-07-24 DIAGNOSIS — I639 Cerebral infarction, unspecified: Secondary | ICD-10-CM | POA: Diagnosis not present

## 2018-07-24 DIAGNOSIS — S299XXA Unspecified injury of thorax, initial encounter: Secondary | ICD-10-CM | POA: Diagnosis not present

## 2018-07-24 DIAGNOSIS — H814 Vertigo of central origin: Secondary | ICD-10-CM

## 2018-07-24 DIAGNOSIS — R9082 White matter disease, unspecified: Secondary | ICD-10-CM | POA: Diagnosis not present

## 2018-07-24 DIAGNOSIS — Z8673 Personal history of transient ischemic attack (TIA), and cerebral infarction without residual deficits: Secondary | ICD-10-CM | POA: Insufficient documentation

## 2018-07-24 DIAGNOSIS — G44309 Post-traumatic headache, unspecified, not intractable: Secondary | ICD-10-CM | POA: Diagnosis not present

## 2018-07-24 DIAGNOSIS — S0093XA Contusion of unspecified part of head, initial encounter: Secondary | ICD-10-CM

## 2018-07-24 DIAGNOSIS — R42 Dizziness and giddiness: Secondary | ICD-10-CM

## 2018-07-24 NOTE — Patient Instructions (Signed)
For your history of fall and right rib pain, I did place x-ray orders to evaluate if you may have suffered rib fracture.  Presently pain level is decreasing and would only recommend Tylenol at this time.  We will update you on x-ray results.  For history of head contusion with possible slight worsening dizziness over your baseline and occasional headaches, I do think it would be best to go ahead and get CT of the head without contrast in light of the fact that you are on Plavix.  We will update you on the CT head results.  If you get worse signs or symptoms even despite negative CT then recommend ED evaluation.  Recommend caution to avoid future falls.  Walker would be beneficial.  Also could refer you to physical therapy if you want and they can assess your gait.  Follow-up as regular scheduled with PCP or as needed.

## 2018-07-24 NOTE — Progress Notes (Signed)
Subjective:    Patient ID: Paul Drake, male    DOB: [birth year], 72 y.o.   MRN: [id number]  HPI  Pt in fell on some steps about one week ago. He was holding trash bags and somehow lost balance. He states he landed on some rt lower ribs. He landed on wood steps. He did hit his rt side cheek. Suffered small cut to cheek and residual bruise. No loc.  Pt has hx of stroke.  And every since he has chronic vertigo on and off. Pt not sure if is worse now than before fall.  Pain level is decreasing in rib. He was able to sleep last night on rt side rib.   Review of Systems  Constitutional: Negative for chills, fatigue and fever.  Respiratory: Negative for cough, chest tightness and wheezing.   Cardiovascular: Negative for chest pain and palpitations.  Gastrointestinal: Negative for abdominal pain, diarrhea, nausea and vomiting.  Genitourinary: Negative for dysuria, hematuria and testicular pain.  Musculoskeletal: Negative for arthralgias, gait problem and neck pain.       Rt rib pain.  Skin: Negative for rash.  Neurological: Positive for dizziness and headaches. Negative for syncope, speech difficulty, weakness, light-headedness and numbness.       Chronic dizziness.   On and off mild since fall in region where head hit.  Hematological: Negative for adenopathy. Does not bruise/bleed easily.  Psychiatric/Behavioral: Negative for behavioral problems, hallucinations, sleep disturbance and suicidal ideas. The patient is not nervous/anxious.     Past Medical History:  Diagnosis Date  . Allergic state 12/29/2013  . Arthritis   . Barrett's esophagus   . BPH (benign prostatic hyperplasia)   . Chicken pox   . CVA (cerebral infarction)   . Depression   . GERD (gastroesophageal reflux disease)   . HTN (hypertension), benign 04/01/2015  . Hyperglycemia 01/06/2015  . Hyperlipemia   . Hypertension   . IBS (irritable bowel syndrome)   . Kidney stones   . Left hand pain 07/21/2017  . Measles as  a child  . Medicare annual wellness visit, subsequent 06/26/2015  . Mumps as a child  . Otitis externa 12/10/2013  . Seasonal allergies    some asthma  . Stroke ([place])   . Unspecified asthma(493.90) 12/29/2013     Social History   Socioeconomic History  . Marital status: Married    Spouse name: Webb Silversmith  . Number of children: 2  . Years of education: college  . Highest education level: Not on file  Occupational History    Comment: retired  Scientific laboratory technician  . Financial resource strain: Not on file  . Food insecurity:    Worry: Not on file    Inability: Not on file  . Transportation needs:    Medical: Not on file    Non-medical: Not on file  Tobacco Use  . Smoking status: Never Smoker  . Smokeless tobacco: Never Used  Substance and Sexual Activity  . Alcohol use: Yes    Alcohol/week: 1.0 standard drinks    Types: 1 Shots of liquor per week    Comment: 2 old fashions per evening  . Drug use: No  . 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 Physical activity:    Days per week: Not on file    Minutes per session: Not on file  . Stress: Not on file  Relationships  . Social connections:    Talks  on phone: Not on file    Gets together: Not on file    Attends religious service: Not on file    Active member of club or organization: Not on file    Attends meetings of clubs or organizations: Not on file    Relationship status: Not on file  . Intimate partner violence:    Fear of current or ex partner: Not on file    Emotionally abused: Not on file    Physically abused: Not on file    Forced sexual activity: Not on file  Other Topics Concern  . Not on file  Social History Narrative   Patient consumes 3 cups of caffeine daily   Left handed   Lives at home with his wife. They are living in their daughter's house to help to help take care of their granddaughter.    Past Surgical History:  Procedure Laterality  Date  . COLONOSCOPY    . EP IMPLANTABLE DEVICE N/A 10/16/2016   Procedure: Loop Recorder Insertion;  Surgeon: Thompson Grayer, MD;  Location: [place] [facility];  Service: Cardiovascular;  Laterality: N/A;  . LITHOTRIPSY     multiple times  . PROSTATE SURGERY  08/2017  . TEE WITHOUT CARDIOVERSION N/A 09/22/2014   Procedure: TRANSESOPHAGEAL ECHOCARDIOGRAM (TEE);  Surgeon: Josue Hector, MD;  Location: [facility];  Service: Cardiovascular;  Laterality: N/A;  . WISDOM TOOTH EXTRACTION      Family History  Problem Relation Age of Onset  . Hypertension Mother   . Hyperlipidemia Mother   . Fibromyalgia Mother   . Arthritis Mother        rheumatoid  . Diabetes Sister        type 2  . Hyperlipidemia Brother   . Hypertension Brother   . Ulcers Father 36       Bleeding Ulcers  . Kidney Stones Daughter   . Asthma Daughter   . Healthy Son   . Cancer Maternal Grandfather        skin ?  . Stroke Maternal Aunt   . Cancer Maternal Uncle        prostate    Allergies  Allergen Reactions  . Molds & Smuts Other (See Comments)  . Pollen Extract Other (See Comments)    Runny nose,itchy eyes      BP 131/70   Pulse 76   Temp 97.6 F (36.4 C) (Oral)   Resp 16   Ht 5\' 8"  (1.727 m)   Wt 167 lb 9.6 oz (76 kg)   SpO2 100%   BMI 25.48 kg/m       Objective:   Physical Exam  General Mental Status- Alert. General Appearance- Not in acute distress.   Skin General: Color- Normal Color. Moisture- Normal Moisture.  Neck Carotid Arteries- Normal color. Moisture- Normal Moisture. No carotid bruits. No JVD.  Chest and Lung Exam Auscultation: Breath Sounds:-Normal.  Cardiovascular Auscultation:Rythm- Regular. Murmurs & Other Heart Sounds:Auscultation of the heart reveals- No Murmurs.  Abdomen Inspection:-Inspeection Normal. Palpation/Percussion:Note:No mass. Palpation and Percussion of the abdomen reveal- Non Tender, Non Distended + BS, no rebound or  guarding.    Neurologic Cranial Nerve exam:- CN III-XII intact(No nystagmus), symmetric smile. Drift Test:- No drift. Romberg Exam:- Negative.  Heal to Toe Gait exam:- off but this is baseline after stroke. Finger to Nose:- Normal/Intact Strength:- 5/5 equal and symmetric strength both upper and lower extremities.  Anterior thorax- rt rib pain. Rt side rib pain and lower costochondral junction tenderness to palpation.  No bruising presently  Face- mild bruise under rt eye.  Cranium- no pain on palpation of skull.    Assessment & Plan:  For your history of fall and right rib pain, I did place x-ray orders to evaluate if you may have suffered rib fracture.  Presently pain level is decreasing and would only recommend Tylenol at this time.  We will update you on x-ray results.  For history of head contusion with possible slight worsening dizziness over your baseline and occasional headaches, I do think it would be best to go ahead and get CT of the head without contrast in light of the fact that you are on Plavix.  We will update you on the CT head results.  If you get worse signs or symptoms even despite negative CT then recommend ED evaluation.  Recommend caution to avoid future falls.  Walker would be beneficial.  Also could refer you to physical therapy if you want and they can assess your gait.  Follow-up as regular scheduled with PCP or as needed.  Mackie Pai, PA-C

## 2018-08-04 ENCOUNTER — Ambulatory Visit (INDEPENDENT_AMBULATORY_CARE_PROVIDER_SITE_OTHER): Payer: Medicare Other

## 2018-08-04 DIAGNOSIS — I639 Cerebral infarction, unspecified: Secondary | ICD-10-CM | POA: Diagnosis not present

## 2018-08-05 NOTE — Progress Notes (Signed)
Carelink Summary Report / Loop Recorder 

## 2018-08-26 ENCOUNTER — Ambulatory Visit (INDEPENDENT_AMBULATORY_CARE_PROVIDER_SITE_OTHER): Payer: Medicare Other | Admitting: Family Medicine

## 2018-08-26 VITALS — BP 139/73 | HR 66 | Temp 98.0°F | Resp 18 | Wt 163.4 lb

## 2018-08-26 DIAGNOSIS — E349 Endocrine disorder, unspecified: Secondary | ICD-10-CM

## 2018-08-26 DIAGNOSIS — J301 Allergic rhinitis due to pollen: Secondary | ICD-10-CM

## 2018-08-26 DIAGNOSIS — R739 Hyperglycemia, unspecified: Secondary | ICD-10-CM | POA: Diagnosis not present

## 2018-08-26 DIAGNOSIS — F418 Other specified anxiety disorders: Secondary | ICD-10-CM

## 2018-08-26 DIAGNOSIS — G450 Vertebro-basilar artery syndrome: Secondary | ICD-10-CM

## 2018-08-26 DIAGNOSIS — R5381 Other malaise: Secondary | ICD-10-CM

## 2018-08-26 DIAGNOSIS — R351 Nocturia: Secondary | ICD-10-CM | POA: Diagnosis not present

## 2018-08-26 DIAGNOSIS — I10 Essential (primary) hypertension: Secondary | ICD-10-CM | POA: Diagnosis not present

## 2018-08-26 DIAGNOSIS — W19XXXS Unspecified fall, sequela: Secondary | ICD-10-CM | POA: Diagnosis not present

## 2018-08-26 DIAGNOSIS — I639 Cerebral infarction, unspecified: Secondary | ICD-10-CM

## 2018-08-26 DIAGNOSIS — R531 Weakness: Secondary | ICD-10-CM

## 2018-08-26 DIAGNOSIS — R7989 Other specified abnormal findings of blood chemistry: Secondary | ICD-10-CM

## 2018-08-26 DIAGNOSIS — E7801 Familial hypercholesterolemia: Secondary | ICD-10-CM | POA: Diagnosis not present

## 2018-08-26 LAB — CBC WITH DIFFERENTIAL/PLATELET
BASOS ABS: 0.1 10*3/uL (ref 0.0–0.1)
BASOS PCT: 1.8 % (ref 0.0–3.0)
EOS ABS: 0.6 10*3/uL (ref 0.0–0.7)
Eosinophils Relative: 11.5 % — ABNORMAL HIGH (ref 0.0–5.0)
HCT: 41.4 % (ref 39.0–52.0)
Hemoglobin: 13.9 g/dL (ref 13.0–17.0)
Lymphocytes Relative: 25.2 % (ref 12.0–46.0)
Lymphs Abs: 1.3 10*3/uL (ref 0.7–4.0)
MCHC: 33.6 g/dL (ref 30.0–36.0)
MCV: 96.7 fl (ref 78.0–100.0)
MONO ABS: 0.6 10*3/uL (ref 0.1–1.0)
MONOS PCT: 10.9 % (ref 3.0–12.0)
NEUTROS ABS: 2.6 10*3/uL (ref 1.4–7.7)
Neutrophils Relative %: 50.6 % (ref 43.0–77.0)
PLATELETS: 176 10*3/uL (ref 150.0–400.0)
RBC: 4.29 Mil/uL (ref 4.22–5.81)
RDW: 12.9 % (ref 11.5–15.5)
WBC: 5.1 10*3/uL (ref 4.0–10.5)

## 2018-08-26 LAB — COMPREHENSIVE METABOLIC PANEL
ALT: 11 U/L (ref 0–53)
AST: 17 U/L (ref 0–37)
Albumin: 4.4 g/dL (ref 3.5–5.2)
Alkaline Phosphatase: 59 U/L (ref 39–117)
BILIRUBIN TOTAL: 0.8 mg/dL (ref 0.2–1.2)
BUN: 20 mg/dL (ref 6–23)
CHLORIDE: 105 meq/L (ref 96–112)
CO2: 32 mEq/L (ref 19–32)
CREATININE: 1.07 mg/dL (ref 0.40–1.50)
Calcium: 9.8 mg/dL (ref 8.4–10.5)
GFR: 71.76 mL/min (ref >60.00)
GLUCOSE: 108 mg/dL — AB (ref 70–99)
Potassium: 4.7 mEq/L (ref 3.5–5.1)
SODIUM: 142 meq/L (ref 135–145)
Total Protein: 6.7 g/dL (ref 6.0–8.3)

## 2018-08-26 LAB — URINALYSIS
Bilirubin Urine: NEGATIVE
Hgb urine dipstick: NEGATIVE
KETONES UR: NEGATIVE
Leukocytes, UA: NEGATIVE
Nitrite: NEGATIVE
Specific Gravity, Urine: 1.01 (ref 1.000–1.030)
Total Protein, Urine: NEGATIVE
UROBILINOGEN UA: 0.2 (ref 0.0–1.0)
Urine Glucose: NEGATIVE
pH: 6.5 (ref 5.0–8.0)

## 2018-08-26 LAB — LIPID PANEL
CHOL/HDL RATIO: 2
Cholesterol: 167 mg/dL (ref 0–200)
HDL: 71.9 mg/dL (ref >39.00)
LDL CALC: 74 mg/dL (ref 0–99)
NONHDL: 94.61
Triglycerides: 104 mg/dL (ref 0.0–149.0)
VLDL: 20.8 mg/dL (ref 0.0–40.0)

## 2018-08-26 LAB — TESTOSTERONE: Testosterone: 1199.85 ng/dL — ABNORMAL HIGH (ref 300.00–890.00)

## 2018-08-26 LAB — TSH: TSH: 2.31 u[IU]/mL (ref 0.35–4.50)

## 2018-08-26 LAB — HEMOGLOBIN A1C: Hgb A1c MFr Bld: 5.6 % (ref 4.6–6.5)

## 2018-08-26 NOTE — Assessment & Plan Note (Signed)
Encouraged heart healthy diet, increase exercise, avoid trans fats, consider a krill oil cap daily 

## 2018-08-26 NOTE — Assessment & Plan Note (Signed)
hgba1c acceptable, minimize simple carbs. Increase exercise as tolerated.  

## 2018-08-26 NOTE — Assessment & Plan Note (Signed)
Well controlled, no changes to meds. Encouraged heart healthy diet such as the DASH diet and exercise as tolerated.  °

## 2018-08-26 NOTE — Assessment & Plan Note (Signed)
Check testosterone level.  

## 2018-08-26 NOTE — Assessment & Plan Note (Signed)
The switch to Cymbalta has been tolerated. He has noted an improvement with his left hand pain til the evening.

## 2018-08-26 NOTE — Assessment & Plan Note (Addendum)
Flares after working outside in leaves. Consider antihistamine bid, nasal steroid daily, nasal saline as needed

## 2018-08-26 NOTE — Assessment & Plan Note (Signed)
Very frustrated with worsening equilibrium and weakness especially with exertion check labs

## 2018-08-26 NOTE — Patient Instructions (Signed)
Dizziness °Dizziness is a common problem. It is a feeling of unsteadiness or light-headedness. You may feel like you are about to faint. Dizziness can lead to injury if you stumble or fall. Anyone can become dizzy, but dizziness is more common in older adults. This condition can be caused by a number of things, including medicines, dehydration, or illness. °Follow these instructions at home: °Eating and drinking °· Drink enough fluid to keep your urine clear or pale yellow. This helps to keep you from becoming dehydrated. Try to drink more clear fluids, such as water. °· Do not drink alcohol. °· Limit your caffeine intake if told to do so by your health care provider. Check ingredients and nutrition facts to see if a food or beverage contains caffeine. °· Limit your salt (sodium) intake if told to do so by your health care provider. Check ingredients and nutrition facts to see if a food or beverage contains sodium. °Activity °· Avoid making quick movements. °? Rise slowly from chairs and steady yourself until you feel okay. °? In the morning, first sit up on the side of the bed. When you feel okay, stand slowly while you hold onto something until you know that your balance is fine. °· If you need to stand in one place for a long time, move your legs often. Tighten and relax the muscles in your legs while you are standing. °· Do not drive or use heavy machinery if you feel dizzy. °· Avoid bending down if you feel dizzy. Place items in your home so that they are easy for you to reach without leaning over. °Lifestyle °· Do not use any products that contain nicotine or tobacco, such as cigarettes and e-cigarettes. If you need help quitting, ask your health care provider. °· Try to reduce your stress level by using methods such as yoga or meditation. Talk with your health care provider if you need help to manage your stress. °General instructions °· Watch your dizziness for any changes. °· Take over-the-counter and  prescription medicines only as told by your health care provider. Talk with your health care provider if you think that your dizziness is caused by a medicine that you are taking. °· Tell a friend or a family member that you are feeling dizzy. If he or she notices any changes in your behavior, have this person call your health care provider. °· Keep all follow-up visits as told by your health care provider. This is important. °Contact a health care provider if: °· Your dizziness does not go away. °· Your dizziness or light-headedness gets worse. °· You feel nauseous. °· You have reduced hearing. °· You have new symptoms. °· You are unsteady on your feet or you feel like the room is spinning. °Get help right away if: °· You vomit or have diarrhea and are unable to eat or drink anything. °· You have problems talking, walking, swallowing, or using your arms, hands, or legs. °· You feel generally weak. °· You are not thinking clearly or you have trouble forming sentences. It may take a friend or family member to notice this. °· You have chest pain, abdominal pain, shortness of breath, or sweating. °· Your vision changes. °· You have any bleeding. °· You have a severe headache. °· You have neck pain or a stiff neck. °· You have a fever. °These symptoms may represent a serious problem that is an emergency. Do not wait to see if the symptoms will go away. Get medical help   right away. Call your local emergency services (911 in the [place] Do not drive yourself to the hospital. °Summary °· Dizziness is a feeling of unsteadiness or light-headedness. This condition can be caused by a number of things, including medicines, dehydration, or illness. °· Anyone can become dizzy, but dizziness is more common in older adults. °· Drink enough fluid to keep your urine clear or pale yellow. Do not drink alcohol. °· Avoid making quick movements if you feel dizzy. Monitor your dizziness for any changes. °This information is not intended to  replace advice given to you by your health care provider. Make sure you discuss any questions you have with your health care provider. °Document Released: 02/27/2001 Document Revised: 10/06/2016 Document Reviewed: 10/06/2016 °Elsevier Interactive Patient Education © 2018 [employer] Inc[employer] ° °

## 2018-08-27 ENCOUNTER — Other Ambulatory Visit: Payer: Self-pay | Admitting: Family Medicine

## 2018-08-27 ENCOUNTER — Telehealth: Payer: Self-pay | Admitting: Adult Health

## 2018-08-27 DIAGNOSIS — R7989 Other specified abnormal findings of blood chemistry: Secondary | ICD-10-CM

## 2018-08-27 NOTE — Telephone Encounter (Signed)
Pt called stating his PCP would like him to come in sooner due to increased balance issues, all over weakness. Pt states after walking for a short distance he feels very tired. Scheduled appt with NP for next Thursday 12/19 at 8:45

## 2018-08-27 NOTE — Telephone Encounter (Signed)
Noted! Thank you

## 2018-08-29 ENCOUNTER — Telehealth: Payer: Self-pay

## 2018-08-29 NOTE — Telephone Encounter (Signed)
Copied from [facility] [id number]. Topic: General - Other >> Aug 27, 2018  4:56 PM Cecelia Byars, NT wrote: Reason for CRM: Patient says he was told he needed to have ultrasound done , but was not told why  and would like for someone to call and explain to  him in detail, please call him at [phone]

## 2018-08-29 NOTE — Progress Notes (Signed)
Subjective:    Patient ID: Paul Drake, male    DOB: 06/16/1946, 72 y.o.   MRN: [id number]  No chief complaint on file.   HPI Patient is in today for evaluation of an episode of significant vertigo. Struggling with ongoing balance issues but always worse after excessive activity. 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 He admits to some allergic symptoms and dehydration while working. No recent febrile illness or hospitalizations. No syncope or headaches. Denies CP/palp/SOB/HA/fevers/GI or GU c/o. Taking meds as prescribed. Has been having some head congestion as well.  Past Medical History:  Diagnosis Date  . Allergic state 12/29/2013  . Arthritis   . Barrett's esophagus   . BPH (benign prostatic hyperplasia)   . Chicken pox   . CVA (cerebral infarction)   . Depression   . GERD (gastroesophageal reflux disease)   . HTN (hypertension), benign 04/01/2015  . Hyperglycemia 01/06/2015  . Hyperlipemia   . Hypertension   . IBS (irritable bowel syndrome)   . Kidney stones   . Left hand pain 07/21/2017  . Measles as a child  . Medicare annual wellness visit, subsequent 06/26/2015  . Mumps as a child  . Otitis externa 12/10/2013  . Seasonal allergies    some asthma  . Stroke ([place])   . Unspecified asthma(493.90) 12/29/2013    Past Surgical History:  Procedure Laterality Date  . COLONOSCOPY    . EP IMPLANTABLE DEVICE N/A 10/16/2016   Procedure: Loop Recorder Insertion;  Surgeon: Thompson Grayer, MD;  Location: [place] [facility];  Service: Cardiovascular;  Laterality: N/A;  . LITHOTRIPSY     multiple times  . PROSTATE SURGERY  08/2017  . TEE WITHOUT CARDIOVERSION N/A 09/22/2014   Procedure: TRANSESOPHAGEAL ECHOCARDIOGRAM (TEE);  Surgeon: Josue Hector, MD;  Location: [facility] ENDOSCOPY;  Service: Cardiovascular;  Laterality: N/A;  . WISDOM TOOTH EXTRACTION      Family History  Problem Relation  Age of Onset  . Hypertension Mother   . Hyperlipidemia Mother   . Fibromyalgia Mother   . Arthritis Mother        rheumatoid  . Diabetes Sister        type 2  . Hyperlipidemia Brother   . Hypertension Brother   . Ulcers Father 36       Bleeding Ulcers  . Kidney Stones Daughter   . Asthma Daughter   . Healthy Son   . Cancer Maternal Grandfather        skin ?  . Stroke Maternal Aunt   . Cancer Maternal Uncle        prostate    Social History   Socioeconomic History  . Marital status: Married    Spouse name: Webb Silversmith  . Number of children: 2  . Years of education: college  . Highest education level: Not on file  Occupational History    Comment: retired  Scientific laboratory technician  . Financial resource strain: Not on file  . Food insecurity:    Worry: Not on file    Inability: Not on file  . Transportation needs:    Medical: Not on file    Non-medical: Not on file  Tobacco Use  . Smoking status: Never Smoker  . Smokeless tobacco: Never Used  Substance and Sexual Activity  . Alcohol use: Yes    Alcohol/week: 1.0 standard drinks    Types: 1 Shots of liquor per  week    Comment: 2 old fashions per evening  . Drug use: No  . 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 Physical activity:    Days per week: Not on file    Minutes per session: Not on file  . Stress: Not on file  Relationships  . Social connections:    Talks on phone: Not on file    Gets together: Not on file    Attends religious service: Not on file    Active member of club or organization: Not on file    Attends meetings of clubs or organizations: Not on file    Relationship status: Not on file  . Intimate partner violence:    Fear of current or ex partner: Not on file    Emotionally abused: Not on file    Physically abused: Not on file    Forced sexual activity: Not on file  Other Topics Concern  . Not on file  Social History Narrative    Patient consumes 3 cups of caffeine daily   Left handed   Lives at home with his wife. They are living in their daughter's house to help to help take care of their granddaughter.      Allergies  Allergen Reactions  . Molds & Smuts Other (See Comments)  . Pollen Extract Other (See Comments)    Runny nose,itchy eyes    Review of Systems  Constitutional: Negative for fever and malaise/fatigue.  HENT: Negative for congestion.   Eyes: Negative for blurred vision.  Respiratory: Negative for shortness of breath.   Cardiovascular: Negative for chest pain, palpitations and leg swelling.  Gastrointestinal: Negative for abdominal pain, blood in stool and nausea.  Genitourinary: Negative for dysuria and frequency.  Musculoskeletal: Negative for falls.  Skin: Negative for rash.  Neurological: Positive for dizziness and weakness. Negative for loss of consciousness and headaches.  Endo/Heme/Allergies: Negative for environmental allergies.  Psychiatric/Behavioral: Positive for depression. The patient is nervous/anxious.        Objective:    Physical Exam Vitals signs and nursing note reviewed.  Constitutional:      General: He is not in acute distress.    Appearance: He is well-developed.  HENT:     Head: Normocephalic and atraumatic.     Nose: Nose normal.  Eyes:     General:        Right eye: No discharge.        Left eye: No discharge.  Neck:     Musculoskeletal: Normal range of motion and neck supple.  Cardiovascular:     Rate and Rhythm: Normal rate and regular rhythm.     Heart sounds: No murmur.  Pulmonary:     Effort: Pulmonary effort is normal.     Breath sounds: Normal breath sounds.  Abdominal:     General: Bowel sounds are normal.     Palpations: Abdomen is soft.     Tenderness: There is no abdominal tenderness.  Skin:    General: Skin is warm and dry.  Neurological:     Mental Status: He is alert and oriented to person, place, and time.     BP 139/73 (BP  Location: Left Arm, Patient Position: Sitting, Cuff Size: Normal)   Pulse 66   Temp 98 F (36.7 C) (Oral)   Resp 18   Wt 163 lb 6.4 oz (74.1 kg)   SpO2 100%   BMI 24.84  kg/m  Wt Readings from Last 3 Encounters:  08/26/18 163 lb 6.4 oz (74.1 kg)  07/24/18 167 lb 9.6 oz (76 kg)  06/16/18 165 lb (74.8 kg)     Lab Results  Component Value Date   WBC 5.1 08/26/2018   HGB 13.9 08/26/2018   HCT 41.4 08/26/2018   PLT 176.0 08/26/2018   GLUCOSE 108 (H) 08/26/2018   CHOL 167 08/26/2018   TRIG 104.0 08/26/2018   HDL 71.90 08/26/2018   LDLDIRECT 76.0 12/31/2014   LDLCALC 74 08/26/2018   ALT 11 08/26/2018   AST 17 08/26/2018   NA 142 08/26/2018   K 4.7 08/26/2018   CL 105 08/26/2018   CREATININE 1.07 08/26/2018   BUN 20 08/26/2018   CO2 32 08/26/2018   TSH 2.31 08/26/2018   INR 0.98 09/20/2014   HGBA1C 5.6 08/26/2018    Lab Results  Component Value Date   TSH 2.31 08/26/2018   Lab Results  Component Value Date   WBC 5.1 08/26/2018   HGB 13.9 08/26/2018   HCT 41.4 08/26/2018   MCV 96.7 08/26/2018   PLT 176.0 08/26/2018   Lab Results  Component Value Date   NA 142 08/26/2018   K 4.7 08/26/2018   CO2 32 08/26/2018   GLUCOSE 108 (H) 08/26/2018   BUN 20 08/26/2018   CREATININE 1.07 08/26/2018   BILITOT 0.8 08/26/2018   ALKPHOS 59 08/26/2018   AST 17 08/26/2018   ALT 11 08/26/2018   PROT 6.7 08/26/2018   ALBUMIN 4.4 08/26/2018   CALCIUM 9.8 08/26/2018   ANIONGAP 7 09/14/2016   GFR 71.76 08/26/2018   Lab Results  Component Value Date   CHOL 167 08/26/2018   Lab Results  Component Value Date   HDL 71.90 08/26/2018   Lab Results  Component Value Date   LDLCALC 74 08/26/2018   Lab Results  Component Value Date   TRIG 104.0 08/26/2018   Lab Results  Component Value Date   CHOLHDL 2 08/26/2018   Lab Results  Component Value Date   HGBA1C 5.6 08/26/2018       Assessment & Plan:   Problem List Items Addressed This Visit    Stroke [facility])    Very  frustrated with worsening equilibrium and weakness especially with exertion check labs       Relevant Orders   Ambulatory referral to Physical Therapy   Depression with anxiety    The switch to Cymbalta has been tolerated. He has noted an improvement with his left hand pain til the evening.      Essential hypertension    Well controlled, no changes to meds. Encouraged heart healthy diet such as the DASH diet and exercise as tolerated.       Relevant Orders   CBC with Differential/Platelet (Completed)   Comprehensive metabolic panel (Completed)   TSH (Completed)   Allergic rhinitis    Flares after working outside in leaves. Consider antihistamine bid, nasal steroid daily, nasal saline as needed      Relevant Orders   Testosterone (Completed)   Hyperglycemia    hgba1c acceptable, minimize simple carbs. Increase exercise as tolerated.       Relevant Orders   Hemoglobin A1c (Completed)   HLD (hyperlipidemia)    Encouraged heart healthy diet, increase exercise, avoid trans fats, consider a krill oil cap daily      Relevant Orders   Lipid panel (Completed)   Vertebrobasilar artery syndrome    Struggling with ongoing balance issues but always  worse after excessive activity. 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 He admits to some allergic symptoms and dehydration while working. He will start allergy meds, hydrate better and make sure he eats protein every 4-5 hours. Return to neurology for further evaluation if symptoms worsen       Elevated testosterone level in male    Check testosterone level       Other Visit Diagnoses    Debility    -  Primary   Relevant Orders   Ambulatory referral to Physical Therapy   Fall, sequela       Relevant Orders   Ambulatory referral to Physical Therapy   Weakness       Relevant Orders   Ambulatory referral to Physical Therapy   Nocturia        Relevant Orders   Urinalysis (Completed)      I am having [name] maintain his multivitamins ther. w/minerals, Omega-3 Fatty Acids (OMEGA-3 PO), Simethicone (GAS-X PO), fluticasone, gabapentin, albuterol, clonazePAM, DULoxetine, pantoprazole, simvastatin, clopidogrel, and losartan.  No orders of the defined types were placed in this encounter.    Penni Homans, MD

## 2018-08-29 NOTE — Assessment & Plan Note (Addendum)
Struggling with ongoing balance issues but always worse after excessive activity. 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 He admits to some allergic symptoms and dehydration while working. He will start allergy meds, hydrate better and make sure he eats protein every 4-5 hours. Return to neurology for further evaluation if symptoms worsen

## 2018-09-03 NOTE — Progress Notes (Signed)
STROKE NEUROLOGY FOLLOW UP NOTE  NAME: Paul Drake DOB: Nov 28, 1945  REASON FOR VISIT: stroke follow up HISTORY FROM: pt and chart  Chief Complaint  Patient presents with  . Follow-up    CVA follow up room 9 pt alone pt fell down some stairs,pt hit his head and black eye  increase balance and weakness     History Summary Paul Drake is a LH 72 y.o. male with a history of right MCA infarct in 2008 with residue LLE minimal weakness was admitted on 09/20/14 for acute onset left sided weakness and difficulty with speech.he received tPA. His acute right frontal stroke is adjacent to your previous stroke in 2008 shown in MRI. Stroke work up showed PFO with valsalva on TEE and positive right LE DVT. He was put on Xarelto 15mg  twice a day for 21 days and then 20mg  daily for 5 months. He mentioned that before this happened he also had some diarrhea and he might have done valsalva maneuvers then.   Patient's previous stroke was in 2008 and the patient has a resultant LLE numbness and weakness. Patient was on ASA but stopped it about a month ago due to gastritis.He denies N/V. However, shortly after the gastritis, he developed kidney stone. He may have a lot of pain which caused him to have valsalva maneuvers.   09/26/16 follow up - pt had kidney stone surgery in 07/2016 and off plavix for 5-7 days and currently plavix resumed. However, on 09/14/16, he was walking on the street, had sudden onset staggering, imbalance and kept leaning towards right. He felt dizzy, but not vertigo, no fall, no double vision or speech difficulty. No vomiting but mild nausea. It lasted 15-20 sec and resolved. He managed to get home. Wife called EMS and he was sent to ED. CT head negative. He refused further study and come today for follow up. Denies leg pain or swelling. BP 104/72  01/16/17 follow up - LE venous doppler showed no DVT, and CTA head and neck unremarkable. Discussed with Dr. Rayann Heman and loop recorder placed  on 10/16/16.   01/27/18 visit JX: During the interval time, patient has been doing stable.  Follow with PCP, restarted Zocor 20  and losartan for BP control.  Today BP 122/75.  Still complains of left fingers burning/stinging/hurting currently onwith decreased sensation, constant.  Still on night dose Neurontin, not able to tolerate daytime Neurontin due to drowsiness.  Patient also complaining of violent dreams, acting out of dreams, even ruling out of bed with concussion.  Happening twice per week and average 3 dreams per night.  He does have history of sleepwalking when he was young.   05/01/18 visit: Patient is being seen today for routine scheduled office visit for stroke follow-up.  He continues to do well from a stroke standpoint.  After previous visit, patient was placed on Cymbalta due to continued nerve pain in left hand and he states that he has obtained great benefit from this medication as he has very limited amount of pain in his hand.  He does state with increased activity he will have some pain but even this has improved.  He does continue to take Neurontin 300 mg at night as he is unable to tolerate during the day due to making him too fatigued.  He also start taking melatonin 15 mg for REM behavior disorder as recommended at previous visit.  He states since starting this, he has not been as violent during his dreams or throwing  himself out of the bed but he does still continue to have violent dreams with some acting out.  Patient continues to take Plavix without side effects of bleeding and bruising.  Continues to take simvastatin without side effects of myalgias.  Loop recorder has not shown atrial fibrillation thus far.  Upon assessment, heart rate found to be irregular with patient asymptomatic.  Will contact cardiology to obtain strip during this time to ensure this is not atrial fibrillation.  Blood pressure today satisfactory 119/81.  Patient denies new or worsening stroke/TIA  symptoms.  Interval history 09/04/2018: Patient has requested to be seen today due to increased balance issues and generalized weakness with increased fatigue after walking short distances.  He states approximately 2 weeks ago he was using a leaf blower in his yard and started to have balance difficulties stating "I was staggering like a drunk".  He had a difficult time walking around his house stating "my legs did not want to work".  He denies any pain associated or any other neurological symptom.  After resting for some time, his symptoms resolved.  A few days later he attempted to go out and rake his leaves thinking possibly had something to do with the leaf blower but he again experienced the same symptoms.  He has had these symptoms in the past and has been experiencing balance difficulties and lower extremity weakness since his prior stroke approximately 1 year ago but states recently this has been worsening.  He states he is typically active and has been having a difficult time doing so recently.  He denies any shortness of breath with activity, chest pain or palpitations.  He also states worsening balance when he is in a dark room and will experience a vertigo sensation.  He was seen by his PCP for this concern and obtain lab work including CMP, CBC, TSH, lipid panel and testosterone level with all normal levels except for elevated testosterone level and recommended follow-up with his urologist.  She advised him to increase his fluid intake along with ensuring adequate protein intake and to undergo physical therapy.  He states slight increase in his fluid intake drinking approximately 3 carbonated water beverages a day.  He has continued on Plavix without side effects of bleeding or bruising.  Loop recorder has not shown atrial fibrillation thus far.  Blood pressure today 123/71.     REVIEW OF SYSTEMS: Full 14 system review of systems performed and notable only for those listed below and in HPI above,  all others are negative:  Constipation, frequent waking, sleep talking, acting out dreams, walking difficulty, dizziness and weakness   The following represents the patient's updated allergies and side effects list: Allergies  Allergen Reactions  . Molds & Smuts Other (See Comments)  . Pollen Extract Other (See Comments)    Runny nose,itchy eyes    The neurologically relevant items on the patient's problem list were reviewed on today's visit.  Neurologic Examination  A problem focused neurological exam (12 or more points of the single system neurologic examination, vital signs counts as 1 point, cranial nerves count for 8 points) was performed.  Blood pressure 123/71, pulse 78, height 5\' 8"  (1.727 m), weight 166 lb 8 oz (75.5 kg).  General - Well nourished, well developed, pleasant elderly Caucasian male, in no apparent distress.  Ophthalmologic - Sharp disc margins OU.  Cardiovascular -regular rate and rhythm with no murmur.  Mental Status -  Level of arousal and orientation to time, place, and  person were intact. Language including expression, naming, repetition, comprehension, reading, and writing was assessed and found intact. Attention span and concentration were normal. Recent and remote memory were intact. Fund of Knowledge was assessed and was intact.  Cranial Nerves II - XII - II - Visual field intact OU. III, IV, VI - Extraocular movements intact. V - Facial sensation intact bilaterally. VII - Facial movement intact bilaterally. VIII - Hearing & vestibular intact bilaterally. X - Palate elevates symmetrically. XI - Chin turning & shoulder shrug intact bilaterally. XII - Tongue protrusion intact.  Motor Strength - The patient's strength was normal in all extremities and pronator drift was absent.  Bulk was normal and fasciculations were absent.   Motor Tone - Muscle tone was assessed at the neck and appendages and was normal.  Reflexes - The patient's reflexes  were normal in all extremities and he had no pathological reflexes.  Sensory - equal sensation throughout all extremities and Romberg testing were assessed and were normal.    Coordination - The patient had normal movements in the hands and feet with no ataxia or dysmetria.  Tremor was absent.  Gait and Station - The patient's transfers, posture, gait, station, and turns were observed as normal.  Heel/toe and tandem walk were performed with mild difficulty.    Assessment: As you may recall, he is a 72 y.o. Caucasian male with PMH of right MCA CVA in 2008 was admitted on1/4/16 for again right MCA small stroke. Had TIA symptoms with leaning towards right and imbalance gait on 09/14/16. CT negative. Loop recorder placed so far no afib.  Patient is being seen today by patient request due to worsening balance difficulties and bilateral lower extremity weakness with activity.  Plan:  - continue plavix and zocor for stroke prevention -Due to worsening balance difficulties, recommend MRI brain to rule out possible acute infarct -Lab work obtained today to rule out any muscle abnormalities including CK, ANA, CRP and lactate dehydrogenase -Advised patient to increase fluid intake to approximately 8-10 bottles of water daily and increase this more with any type of activity and to ensure he is in taking adequate protein prior to any type of activity -Agree with PCP with patient starting physical therapy -referral placed by PCP -Continue to follow-up with PCP regarding HLD and HTN management and monitoring - Check BP at home and record. - continue monitoring loop recorder - Recommend maintain blood pressure goal 120-130/80, diabetes with hemoglobin A1c goal below 6.5% and lipids with LDL cholesterol goal below 70 mg/dL.   Recommend follow-up in 3 months or call earlier if needed  I spent more than 25 minutes of face to face time with the patient. Greater than 50% of time was spent in counseling and  coordination of care. We discussed loop recorder monitoring, gabapentin use and BP monitoring.   Venancio Poisson, AGNP-BC  [facility] [address] [place], [place] [address]  Phone [phone] Fax [phone] Note: This document was prepared with digital dictation and possible smart phrase technology. Any transcriptional errors that result from this process are unintentional.

## 2018-09-04 ENCOUNTER — Encounter: Payer: Self-pay | Admitting: Adult Health

## 2018-09-04 ENCOUNTER — Telehealth: Payer: Self-pay | Admitting: Adult Health

## 2018-09-04 ENCOUNTER — Ambulatory Visit (INDEPENDENT_AMBULATORY_CARE_PROVIDER_SITE_OTHER): Payer: Medicare Other | Admitting: Adult Health

## 2018-09-04 VITALS — BP 123/71 | HR 78 | Ht 68.0 in | Wt 166.5 lb

## 2018-09-04 DIAGNOSIS — R531 Weakness: Secondary | ICD-10-CM

## 2018-09-04 DIAGNOSIS — H832X9 Labyrinthine dysfunction, unspecified ear: Secondary | ICD-10-CM

## 2018-09-04 DIAGNOSIS — R29818 Other symptoms and signs involving the nervous system: Secondary | ICD-10-CM

## 2018-09-04 DIAGNOSIS — G450 Vertebro-basilar artery syndrome: Secondary | ICD-10-CM

## 2018-09-04 NOTE — Telephone Encounter (Signed)
Medicare/aarp order sent to GI pt is aware. No auth they will reach out to the pt to schedule he is aware of this and he stated he will look up their number.

## 2018-09-04 NOTE — Patient Instructions (Addendum)
Your Plan:  We will check lab work today  We will obtain imaging of your vessels to ensure you have not had a new stroke or worsening of your vessels. You will be called to schedule appointment  Follow up in 3 months or call earlier if needed       Thank you for coming to see [place] at [facility]. I hope we have been able to provide you high quality care today.  You may receive a patient satisfaction survey over the next few weeks. We would appreciate your feedback and comments so that we may continue to improve ourselves and the health of our patients.

## 2018-09-05 DIAGNOSIS — R531 Weakness: Secondary | ICD-10-CM | POA: Diagnosis not present

## 2018-09-05 DIAGNOSIS — R262 Difficulty in walking, not elsewhere classified: Secondary | ICD-10-CM | POA: Diagnosis not present

## 2018-09-05 DIAGNOSIS — R5381 Other malaise: Secondary | ICD-10-CM | POA: Diagnosis not present

## 2018-09-05 DIAGNOSIS — I6930 Unspecified sequelae of cerebral infarction: Secondary | ICD-10-CM | POA: Diagnosis not present

## 2018-09-05 DIAGNOSIS — M25542 Pain in joints of left hand: Secondary | ICD-10-CM | POA: Diagnosis not present

## 2018-09-05 DIAGNOSIS — I6980 Unspecified sequelae of other cerebrovascular disease: Secondary | ICD-10-CM | POA: Diagnosis not present

## 2018-09-05 DIAGNOSIS — I639 Cerebral infarction, unspecified: Secondary | ICD-10-CM | POA: Diagnosis not present

## 2018-09-05 LAB — LACTATE DEHYDROGENASE: LDH: 155 IU/L (ref 121–224)

## 2018-09-05 LAB — CK: Total CK: 76 U/L (ref 24–204)

## 2018-09-05 LAB — ANA: Anti Nuclear Antibody(ANA): NEGATIVE

## 2018-09-05 LAB — C-REACTIVE PROTEIN: CRP: <1 mg/L (ref 0–10)

## 2018-09-08 ENCOUNTER — Ambulatory Visit (INDEPENDENT_AMBULATORY_CARE_PROVIDER_SITE_OTHER): Payer: Medicare Other

## 2018-09-08 ENCOUNTER — Telehealth: Payer: Self-pay

## 2018-09-08 DIAGNOSIS — I639 Cerebral infarction, unspecified: Secondary | ICD-10-CM

## 2018-09-08 LAB — CUP PACEART REMOTE DEVICE CHECK
Date Time Interrogation Session: 20191222033943
Implantable Pulse Generator Implant Date: 20180130

## 2018-09-08 NOTE — Progress Notes (Signed)
Carelink Summary Report / Loop Recorder 

## 2018-09-08 NOTE — Telephone Encounter (Signed)
Rn call patient about his lab work. Rn stated per [name].Rn stated all his labs look good without any concern. Its recommend to ensure he increases his fluid intake along with participating in physical therapy. Pt stated he already has his MRI schedule for 09/19/2018. Pt will start his therapy after the holidays. He verbalized understanding. ------

## 2018-09-08 NOTE — Telephone Encounter (Signed)
-----   Message from Venancio Poisson, NP sent at 09/05/2018 12:16 PM EST ----- Please advise patient that all his labs look good without any concern.  At this time, recommend to ensure he increases his fluid intake along with participating in physical therapy.  He will be called to schedule MRI.

## 2018-09-15 ENCOUNTER — Telehealth: Payer: Self-pay | Admitting: *Deleted

## 2018-09-15 DIAGNOSIS — I6980 Unspecified sequelae of other cerebrovascular disease: Secondary | ICD-10-CM | POA: Diagnosis not present

## 2018-09-15 DIAGNOSIS — I6930 Unspecified sequelae of cerebral infarction: Secondary | ICD-10-CM | POA: Diagnosis not present

## 2018-09-15 DIAGNOSIS — R262 Difficulty in walking, not elsewhere classified: Secondary | ICD-10-CM | POA: Diagnosis not present

## 2018-09-15 DIAGNOSIS — R531 Weakness: Secondary | ICD-10-CM | POA: Diagnosis not present

## 2018-09-15 NOTE — Telephone Encounter (Signed)
Received Physician Orders from [place]; forwarded to provider/SLS 12/30

## 2018-09-19 ENCOUNTER — Ambulatory Visit
Admission: RE | Admit: 2018-09-19 | Discharge: 2018-09-19 | Disposition: A | Discharge status: Home / Self Care | Payer: Medicare Other | Source: Ambulatory Visit | Attending: Adult Health | Admitting: Adult Health

## 2018-09-19 DIAGNOSIS — R29818 Other symptoms and signs involving the nervous system: Secondary | ICD-10-CM

## 2018-09-21 LAB — CUP PACEART REMOTE DEVICE CHECK
Date Time Interrogation Session: 20191119034024
Implantable Pulse Generator Implant Date: 20180130

## 2018-09-21 NOTE — Progress Notes (Signed)
I agree with the above plan 

## 2018-09-22 ENCOUNTER — Telehealth: Payer: Self-pay | Admitting: Adult Health

## 2018-09-22 NOTE — Telephone Encounter (Signed)
Pt requesting a call to discuss MRI results

## 2018-09-22 NOTE — Telephone Encounter (Signed)
Rn call patient about his MR brain. Rn stated the recent image did not show any evidence of acute stroke or abnormal findings. PT verbalized understanding. ------

## 2018-09-23 ENCOUNTER — Ambulatory Visit (INDEPENDENT_AMBULATORY_CARE_PROVIDER_SITE_OTHER): Payer: Medicare Other | Admitting: Family Medicine

## 2018-09-23 ENCOUNTER — Ambulatory Visit (HOSPITAL_BASED_OUTPATIENT_CLINIC_OR_DEPARTMENT_OTHER)
Admission: RE | Admit: 2018-09-23 | Discharge: 2018-09-23 | Disposition: A | Discharge status: Home / Self Care | Payer: Medicare Other | Source: Ambulatory Visit | Attending: Family Medicine | Admitting: Family Medicine

## 2018-09-23 ENCOUNTER — Encounter: Payer: Self-pay | Admitting: Family Medicine

## 2018-09-23 ENCOUNTER — Encounter: Payer: Self-pay | Admitting: Neurology

## 2018-09-23 VITALS — BP 122/76 | HR 67 | Temp 97.7°F | Resp 18 | Ht 68.0 in | Wt 169.6 lb

## 2018-09-23 DIAGNOSIS — I63411 Cerebral infarction due to embolism of right middle cerebral artery: Secondary | ICD-10-CM

## 2018-09-23 DIAGNOSIS — R109 Unspecified abdominal pain: Secondary | ICD-10-CM

## 2018-09-23 DIAGNOSIS — I861 Scrotal varices: Secondary | ICD-10-CM | POA: Diagnosis not present

## 2018-09-23 DIAGNOSIS — R7989 Other specified abnormal findings of blood chemistry: Secondary | ICD-10-CM | POA: Insufficient documentation

## 2018-09-23 DIAGNOSIS — G4752 REM sleep behavior disorder: Secondary | ICD-10-CM

## 2018-09-23 DIAGNOSIS — E349 Endocrine disorder, unspecified: Secondary | ICD-10-CM

## 2018-09-23 DIAGNOSIS — R531 Weakness: Secondary | ICD-10-CM | POA: Diagnosis not present

## 2018-09-23 DIAGNOSIS — I10 Essential (primary) hypertension: Secondary | ICD-10-CM | POA: Diagnosis not present

## 2018-09-23 DIAGNOSIS — J45909 Unspecified asthma, uncomplicated: Secondary | ICD-10-CM | POA: Diagnosis not present

## 2018-09-23 DIAGNOSIS — Z8673 Personal history of transient ischemic attack (TIA), and cerebral infarction without residual deficits: Secondary | ICD-10-CM | POA: Diagnosis not present

## 2018-09-23 MED ORDER — SIMVASTATIN 20 MG PO TABS: ORAL_TABLET | ORAL | 0 refills | Status: DC

## 2018-09-23 MED ORDER — ALBUTEROL SULFATE HFA 108 (90 BASE) MCG/ACT IN AERS: 2.0000 | INHALATION_SPRAY | Freq: Four times a day (QID) | RESPIRATORY_TRACT | 3 refills | Status: DC | PRN

## 2018-09-23 MED ORDER — PANTOPRAZOLE SODIUM 40 MG PO TBEC: DELAYED_RELEASE_TABLET | ORAL | 1 refills | Status: DC

## 2018-09-23 MED ORDER — LOSARTAN POTASSIUM 100 MG PO TABS: ORAL_TABLET | ORAL | 1 refills | Status: DC

## 2018-09-23 NOTE — Assessment & Plan Note (Signed)
Continues to have very vivid and multiple dreams each night. Some are violent and the Klonopin helps take the edge. Can consider dropping the Klonopin 1/2 tab and see if that still helps. Has quit alcohol completely with the newyear.

## 2018-09-23 NOTE — Assessment & Plan Note (Signed)
Well controlled, no changes to meds. Encouraged heart healthy diet such as the DASH diet and exercise as tolerated.  °

## 2018-09-23 NOTE — Patient Instructions (Signed)
Abdominal Pain, Adult  Abdominal pain can be caused by many things. Often, abdominal pain is not serious and it gets better with no treatment or by being treated at home. However, sometimes abdominal pain is serious. Your health care provider will do a medical history and a physical exam to try to determine the cause of your abdominal pain.  Follow these instructions at home:   Take over-the-counter and prescription medicines only as told by your health care provider. Do not take a laxative unless told by your health care provider.   Drink enough fluid to keep your urine clear or pale yellow.   Watch your condition for any changes.   Keep all follow-up visits as told by your health care provider. This is important.  Contact a health care provider if:   Your abdominal pain changes or gets worse.   You are not hungry or you lose weight without trying.   You are constipated or have diarrhea for more than 2-3 days.   You have pain when you urinate or have a bowel movement.   Your abdominal pain wakes you up at night.   Your pain gets worse with meals, after eating, or with certain foods.   You are throwing up and cannot keep anything down.   You have a fever.  Get help right away if:   Your pain does not go away as soon as your health care provider told you to expect.   You cannot stop throwing up.   Your pain is only in areas of the abdomen, such as the right side or the left lower portion of the abdomen.   You have bloody or black stools, or stools that look like tar.   You have severe pain, cramping, or bloating in your abdomen.   You have signs of dehydration, such as:  ? Dark urine, very little urine, or no urine.  ? Cracked lips.  ? Dry mouth.  ? Sunken eyes.  ? Sleepiness.  ? Weakness.  This information is not intended to replace advice given to you by your health care provider. Make sure you discuss any questions you have with your health care provider.  Document Released: 06/13/2005 Document  Revised: 03/23/2016 Document Reviewed: 02/15/2016  [employer]vier Interactive Patient Education  2019 [employer]

## 2018-09-24 NOTE — Progress Notes (Signed)
Subjective:    Patient ID: Paul Drake, male    DOB: 01-16-1946, 73 y.o.   MRN: [id number]  No chief complaint on file.   HPI Patient is in today for follow up. He is continuing to experience left sided weakness and he is worried about falling. He tried physical therapy but it did not make much of a difference since he already works out at home with exercise equipment. No recent febrile illness or hospitalizations. He has been following with [facility]. No other acute concerns. Continues to have infrequent abdominal discomfort. No change in bowel or bladder habits. Denies CP/palp/SOB/HA/congestion/fevers/GI or GU c/o. Taking meds as prescribed  Past Medical History:  Diagnosis Date  . Allergic state 12/29/2013  . Arthritis   . Barrett's esophagus   . BPH (benign prostatic hyperplasia)   . Chicken pox   . CVA (cerebral infarction)   . Depression   . GERD (gastroesophageal reflux disease)   . HTN (hypertension), benign 04/01/2015  . Hyperglycemia 01/06/2015  . Hyperlipemia   . Hypertension   . IBS (irritable bowel syndrome)   . Kidney stones   . Left hand pain 07/21/2017  . Measles as a child  . Medicare annual wellness visit, subsequent 06/26/2015  . Mumps as a child  . Otitis externa 12/10/2013  . Seasonal allergies    some asthma  . Stroke ([place])   . Unspecified asthma(493.90) 12/29/2013    Past Surgical History:  Procedure Laterality Date  . COLONOSCOPY    . EP IMPLANTABLE DEVICE N/A 10/16/2016   Procedure: Loop Recorder Insertion;  Surgeon: Thompson Grayer, MD;  Location: [facility];  Service: Cardiovascular;  Laterality: N/A;  . LITHOTRIPSY     multiple times  . PROSTATE SURGERY  08/2017  . TEE WITHOUT CARDIOVERSION N/A 09/22/2014   Procedure: TRANSESOPHAGEAL ECHOCARDIOGRAM (TEE);  Surgeon: Josue Hector, MD;  Location: [facility];  Service: Cardiovascular;  Laterality: N/A;  . WISDOM TOOTH EXTRACTION      Family History  Problem Relation Age of Onset  . Hypertension  Mother   . Hyperlipidemia Mother   . Fibromyalgia Mother   . Arthritis Mother        rheumatoid  . Diabetes Sister        type 2  . Hyperlipidemia Brother   . Hypertension Brother   . Ulcers Father 36       Bleeding Ulcers  . Kidney Stones Daughter   . Asthma Daughter   . Healthy Son   . Cancer Maternal Grandfather        skin ?  . Stroke Maternal Aunt   . Cancer Maternal Uncle        prostate    Social History   Socioeconomic History  . Marital status: Married    Spouse name: Webb Silversmith  . Number of children: 2  . Years of education: college  . Highest education level: Not on file  Occupational History    Comment: retired  Scientific laboratory technician  . Financial resource strain: Not on file  . Food insecurity:    Worry: Not on file    Inability: Not on file  . Transportation needs:    Medical: Not on file    Non-medical: Not on file  Tobacco Use  . Smoking status: Never Smoker  . Smokeless tobacco: Never Used  Substance and Sexual Activity  . Alcohol use: Yes    Alcohol/week: 1.0 standard drinks    Types: 1 Shots of liquor per week  Comment: 2 old fashions per evening  . Drug use: No  . 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 Physical activity:    Days per week: Not on file    Minutes per session: Not on file  . Stress: Not on file  Relationships  . Social connections:    Talks on phone: Not on file    Gets together: Not on file    Attends religious service: Not on file    Active member of club or organization: Not on file    Attends meetings of clubs or organizations: Not on file    Relationship status: Not on file  . Intimate partner violence:    Fear of current or ex partner: Not on file    Emotionally abused: Not on file    Physically abused: Not on file    Forced sexual activity: Not on file  Other Topics Concern  . Not on file  Social History Narrative   Patient consumes 3 cups of  caffeine daily   Left handed   Lives at home with his wife. They are living in their daughter's house to help to help take care of their granddaughter.      Allergies  Allergen Reactions  . Molds & Smuts Other (See Comments)  . Pollen Extract Other (See Comments)    Runny nose,itchy eyes    Review of Systems  Constitutional: Positive for malaise/fatigue. Negative for fever.  HENT: Negative for congestion.   Eyes: Negative for blurred vision.  Respiratory: Negative for shortness of breath.   Cardiovascular: Negative for chest pain, palpitations and leg swelling.  Gastrointestinal: Negative for abdominal pain, blood in stool and nausea.  Genitourinary: Negative for dysuria and frequency.  Musculoskeletal: Negative for falls.  Skin: Negative for rash.  Neurological: Positive for dizziness and focal weakness. Negative for loss of consciousness and headaches.  Endo/Heme/Allergies: Negative for environmental allergies.  Psychiatric/Behavioral: Negative for depression. The patient has insomnia. The patient is not nervous/anxious.        Objective:    Physical Exam Vitals signs and nursing note reviewed.  Constitutional:      General: He is not in acute distress.    Appearance: He is well-developed.  HENT:     Head: Normocephalic and atraumatic.     Nose: Nose normal.  Eyes:     General:        Right eye: No discharge.        Left eye: No discharge.  Neck:     Musculoskeletal: Normal range of motion and neck supple.  Cardiovascular:     Rate and Rhythm: Normal rate and regular rhythm.     Heart sounds: No murmur.  Pulmonary:     Effort: Pulmonary effort is normal.     Breath sounds: Normal breath sounds.  Abdominal:     General: Bowel sounds are normal.     Palpations: Abdomen is soft.     Tenderness: There is no abdominal tenderness.  Skin:    General: Skin is warm and dry.  Neurological:     Mental Status: He is alert and oriented to person, place, and time.      BP 122/76 (BP Location: Left Arm, Patient Position: Sitting, Cuff Size: Normal)   Pulse 67   Temp 97.7 F (36.5 C) (Oral)   Resp 18   Ht 5\' 8"  (1.727 m)   Wt 169 lb 9.6 oz (  76.9 kg)   SpO2 98%   BMI 25.79 kg/m  Wt Readings from Last 3 Encounters:  09/23/18 169 lb 9.6 oz (76.9 kg)  09/04/18 166 lb 8 oz (75.5 kg)  08/26/18 163 lb 6.4 oz (74.1 kg)     Lab Results  Component Value Date   WBC 5.1 08/26/2018   HGB 13.9 08/26/2018   HCT 41.4 08/26/2018   PLT 176.0 08/26/2018   GLUCOSE 108 (H) 08/26/2018   CHOL 167 08/26/2018   TRIG 104.0 08/26/2018   HDL 71.90 08/26/2018   LDLDIRECT 76.0 12/31/2014   LDLCALC 74 08/26/2018   ALT 11 08/26/2018   AST 17 08/26/2018   NA 142 08/26/2018   K 4.7 08/26/2018   CL 105 08/26/2018   CREATININE 1.07 08/26/2018   BUN 20 08/26/2018   CO2 32 08/26/2018   TSH 2.31 08/26/2018   INR 0.98 09/20/2014   HGBA1C 5.6 08/26/2018    Lab Results  Component Value Date   TSH 2.31 08/26/2018   Lab Results  Component Value Date   WBC 5.1 08/26/2018   HGB 13.9 08/26/2018   HCT 41.4 08/26/2018   MCV 96.7 08/26/2018   PLT 176.0 08/26/2018   Lab Results  Component Value Date   NA 142 08/26/2018   K 4.7 08/26/2018   CO2 32 08/26/2018   GLUCOSE 108 (H) 08/26/2018   BUN 20 08/26/2018   CREATININE 1.07 08/26/2018   BILITOT 0.8 08/26/2018   ALKPHOS 59 08/26/2018   AST 17 08/26/2018   ALT 11 08/26/2018   PROT 6.7 08/26/2018   ALBUMIN 4.4 08/26/2018   CALCIUM 9.8 08/26/2018   ANIONGAP 7 09/14/2016   GFR 71.76 08/26/2018   Lab Results  Component Value Date   CHOL 167 08/26/2018   Lab Results  Component Value Date   HDL 71.90 08/26/2018   Lab Results  Component Value Date   LDLCALC 74 08/26/2018   Lab Results  Component Value Date   TRIG 104.0 08/26/2018   Lab Results  Component Value Date   CHOLHDL 2 08/26/2018   Lab Results  Component Value Date   HGBA1C 5.6 08/26/2018       Assessment & Plan:   Problem List  Items Addressed This Visit    Abdominal pain - Primary   Relevant Orders   DG Abd 2 Views (Completed)   US SCROTUM W/DOPPLER (Completed)   Essential hypertension    Well controlled, no changes to meds. Encouraged heart healthy diet such as the DASH diet and exercise as tolerated.       Relevant Medications   losartan (COZAAR) 100 MG tablet   simvastatin (ZOCOR) 20 MG tablet   Cerebrovascular accident (CVA) due to embolism of right middle cerebral artery ([place])    He feels his left sided weakness and unsteadiness is worsening. After a long discussion it is decided he would like to proceed with second opinion. He will continue with his regular exercise regimen but he is encouraged to add Yoga or TaiChi to help with his balance.       Relevant Medications   losartan (COZAAR) 100 MG tablet   simvastatin (ZOCOR) 20 MG tablet   Asthma   Relevant Medications   albuterol (PROVENTIL HFA;VENTOLIN HFA) 108 (90 Base) MCG/ACT inhaler   REM behavioral disorder    Continues to have very vivid and multiple dreams each night. Some are violent and the Klonopin helps take the edge. Can consider dropping the Klonopin 1/2 tab and see if that  still helps. Has quit alcohol completely with the newyear.       Elevated testosterone level in male    He is referred to urology for further consideration. Scrotal ultrasound is ordered and shows possible epididymitis vs a lesion. Will treat epididymitis while she awaits referral. AXR is ordered and will proceed with CT scan.        Other Visit Diagnoses    Elevated testosterone level       Relevant Orders   US SCROTUM W/DOPPLER (Completed)   H/O: CVA (cerebrovascular accident)       Relevant Orders   Ambulatory referral to Neurology   Left-sided weakness       Relevant Orders   Ambulatory referral to Neurology      I have changed [name] Madore's simvastatin. I am also having him maintain his multivitamins ther. w/minerals, Omega-3 Fatty Acids (OMEGA-3  PO), Simethicone (GAS-X PO), fluticasone, gabapentin, clonazePAM, DULoxetine, clopidogrel, losartan, pantoprazole, and albuterol.  Meds ordered this encounter  Medications  . losartan (COZAAR) 100 MG tablet    Sig: TAKE 1 TABLET(100 MG) BY MOUTH DAILY AS DIRECTED    Dispense:  90 tablet    Refill:  1  . simvastatin (ZOCOR) 20 MG tablet    Sig: Take one tablet po daily    Dispense:  90 tablet    Refill:  0  . pantoprazole (PROTONIX) 40 MG tablet    Sig: TAKE 1 TABLET(40 MG) BY MOUTH DAILY    Dispense:  90 tablet    Refill:  1  . albuterol (PROVENTIL HFA;VENTOLIN HFA) 108 (90 Base) MCG/ACT inhaler    Sig: Inhale 2 puffs into the lungs every 6 (six) hours as needed for wheezing or shortness of breath.    Dispense:  1 Inhaler    Refill:  3     Penni Homans, MD

## 2018-09-24 NOTE — Assessment & Plan Note (Addendum)
He is referred to urology for further consideration. Scrotal ultrasound is ordered and shows possible epididymitis vs a lesion. Will treat epididymitis while she awaits referral. AXR is ordered and will proceed with CT scan.

## 2018-09-24 NOTE — Assessment & Plan Note (Signed)
He feels his left sided weakness and unsteadiness is worsening. After a long discussion it is decided he would like to proceed with second opinion. He will continue with his regular exercise regimen but he is encouraged to add Yoga or TaiChi to help with his balance.

## 2018-09-25 ENCOUNTER — Other Ambulatory Visit: Payer: Self-pay | Admitting: Family Medicine

## 2018-09-25 DIAGNOSIS — R109 Unspecified abdominal pain: Secondary | ICD-10-CM

## 2018-09-25 MED ORDER — SULFAMETHOXAZOLE-TRIMETHOPRIM 800-160 MG PO TABS: 1.0000 | ORAL_TABLET | Freq: Two times a day (BID) | ORAL | 0 refills | Status: DC

## 2018-09-25 NOTE — Addendum Note (Signed)
Addended by: Magdalene Molly A on: 09/25/2018 01:42 PM   Modules accepted: Orders

## 2018-09-30 ENCOUNTER — Ambulatory Visit (HOSPITAL_BASED_OUTPATIENT_CLINIC_OR_DEPARTMENT_OTHER)
Admission: RE | Admit: 2018-09-30 | Discharge: 2018-09-30 | Disposition: A | Discharge status: Home / Self Care | Payer: Medicare Other | Source: Ambulatory Visit | Attending: Family Medicine | Admitting: Family Medicine

## 2018-09-30 DIAGNOSIS — N200 Calculus of kidney: Secondary | ICD-10-CM | POA: Diagnosis not present

## 2018-09-30 DIAGNOSIS — R109 Unspecified abdominal pain: Secondary | ICD-10-CM | POA: Diagnosis not present

## 2018-09-30 DIAGNOSIS — R195 Other fecal abnormalities: Secondary | ICD-10-CM | POA: Diagnosis not present

## 2018-10-02 ENCOUNTER — Telehealth: Payer: Self-pay | Admitting: *Deleted

## 2018-10-02 NOTE — Telephone Encounter (Signed)
Received End Of Care plan from [facility]; forwarded to provider/SLS 01/16
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
## 2018-10-08 ENCOUNTER — Other Ambulatory Visit (INDEPENDENT_AMBULATORY_CARE_PROVIDER_SITE_OTHER): Payer: Medicare Other

## 2018-10-08 ENCOUNTER — Ambulatory Visit (INDEPENDENT_AMBULATORY_CARE_PROVIDER_SITE_OTHER): Payer: Medicare Other | Admitting: Neurology

## 2018-10-08 ENCOUNTER — Encounter: Payer: Self-pay | Admitting: Neurology

## 2018-10-08 VITALS — BP 106/72 | HR 78 | Ht 67.5 in | Wt 164.0 lb

## 2018-10-08 DIAGNOSIS — I63411 Cerebral infarction due to embolism of right middle cerebral artery: Secondary | ICD-10-CM | POA: Diagnosis not present

## 2018-10-08 DIAGNOSIS — R5383 Other fatigue: Secondary | ICD-10-CM | POA: Diagnosis not present

## 2018-10-08 DIAGNOSIS — R531 Weakness: Secondary | ICD-10-CM | POA: Diagnosis not present

## 2018-10-08 DIAGNOSIS — R6889 Other general symptoms and signs: Secondary | ICD-10-CM

## 2018-10-08 NOTE — Patient Instructions (Addendum)
1.  It think the increased weakness, fatigue and balance problems may be due to lack of activity or possibly B12 deficiency.  It does not appear to be due to a primary neurologic disorder such as a neurodegenerative disease, ALS, stroke, or muscle disease.  We will check B12 level.  Try to increase your activity/exercise.  Your provider has requested that you have labwork completed today. Please go to [facility] ([id number]) on the second floor of this building before leaving the office today. You do not need to check in. If you are not called within 15 minutes please check with the front desk.

## 2018-10-09 ENCOUNTER — Other Ambulatory Visit: Payer: Self-pay | Admitting: Neurology

## 2018-10-09 ENCOUNTER — Ambulatory Visit (INDEPENDENT_AMBULATORY_CARE_PROVIDER_SITE_OTHER): Payer: Medicare Other

## 2018-10-09 DIAGNOSIS — I639 Cerebral infarction, unspecified: Secondary | ICD-10-CM

## 2018-10-09 LAB — VITAMIN B12: VITAMIN B 12: 464 pg/mL (ref 211–911)

## 2018-10-10 NOTE — Progress Notes (Signed)
Carelink Summary Report / Loop Recorder 

## 2018-10-12 LAB — CUP PACEART REMOTE DEVICE CHECK
Date Time Interrogation Session: 20200124033714
Implantable Pulse Generator Implant Date: 20180130

## 2018-10-13 DIAGNOSIS — N451 Epididymitis: Secondary | ICD-10-CM | POA: Diagnosis not present

## 2018-10-13 DIAGNOSIS — E291 Testicular hypofunction: Secondary | ICD-10-CM | POA: Diagnosis not present

## 2018-10-13 DIAGNOSIS — N200 Calculus of kidney: Secondary | ICD-10-CM | POA: Diagnosis not present

## 2018-10-13 DIAGNOSIS — E342 Ectopic hormone secretion, not elsewhere classified: Secondary | ICD-10-CM | POA: Diagnosis not present

## 2018-10-13 DIAGNOSIS — R948 Abnormal results of function studies of other organs and systems: Secondary | ICD-10-CM | POA: Diagnosis not present

## 2018-10-13 DIAGNOSIS — N401 Enlarged prostate with lower urinary tract symptoms: Secondary | ICD-10-CM | POA: Diagnosis not present

## 2018-10-13 DIAGNOSIS — R350 Frequency of micturition: Secondary | ICD-10-CM | POA: Diagnosis not present

## 2018-10-16 ENCOUNTER — Telehealth: Payer: Self-pay | Admitting: *Deleted

## 2018-10-16 NOTE — Telephone Encounter (Signed)
-----   Message from Pieter Partridge, DO sent at 10/10/2018  6:30 AM EST ----- B12 is normal

## 2018-10-16 NOTE — Telephone Encounter (Signed)
Called patient and gave results.

## 2018-11-03 ENCOUNTER — Ambulatory Visit: Payer: Medicare Other | Admitting: Adult Health

## 2018-11-11 ENCOUNTER — Ambulatory Visit (INDEPENDENT_AMBULATORY_CARE_PROVIDER_SITE_OTHER): Payer: Medicare Other | Admitting: *Deleted

## 2018-11-11 DIAGNOSIS — I639 Cerebral infarction, unspecified: Secondary | ICD-10-CM | POA: Diagnosis not present

## 2018-11-12 LAB — CUP PACEART REMOTE DEVICE CHECK
Date Time Interrogation Session: 20200226033610
Implantable Pulse Generator Implant Date: 20180130

## 2018-11-14 ENCOUNTER — Other Ambulatory Visit: Payer: Self-pay | Admitting: Adult Health

## 2018-11-17 ENCOUNTER — Telehealth: Payer: Self-pay

## 2018-11-17 ENCOUNTER — Other Ambulatory Visit: Payer: Self-pay | Admitting: Family Medicine

## 2018-11-17 NOTE — Telephone Encounter (Signed)
I called patients pharmacy to find out fax states pt last refill 05/01/2018. Pt was given 3 refills per Paul Billow NP for restless leg syndrome. Pt never refill again. This is per [facility] pharmacy staff at [facility]. Pt was last seen by Paul Billow NP in Dec and has another appt in March 2020. The medication order is closed now. Refill will not be done as of now.

## 2018-11-17 NOTE — Telephone Encounter (Signed)
I called Paul Drake, pts PCP CMA. I explained per note pt stated to their office he does not  see [place] anymore. Pt was given klonopin in 04/2018 for RLS but only got it refill one time. [name] gave him 3 refills. Pt saw our office in 08/2019, another neuro office in January 2020. He is schedule to see [name] in two weeks.This is per the pharmacy on last refill of 04/2018.Princess will make Dr. Charlett Blake aware  of this. I stated that is a schedule night time med not prn.

## 2018-11-17 NOTE — Telephone Encounter (Signed)
Copied from [place] [id number]. Topic: Quick Communication - Rx Refill/Question >> Nov 17, 2018 10:07 AM Bea Graff, NT wrote: Medication: clonazePAM (KLONOPIN) 0.5 MG tablet  States he no longer sees the neuro dr that originally prescribed this medication.   Has the patient contacted their pharmacy? Yes.   (Agent: If no, request that the patient contact the pharmacy for the refill.) (Agent: If yes, when and what did the pharmacy advise?)  Preferred Pharmacy (with phone number or street name): [id number] - [place], [place] - [address] AT [place] [phone] (Phone) [phone] (Fax)    Agent: Please be advised that RX refills may take up to 3 business days. We ask that you follow-up with your pharmacy.

## 2018-11-17 NOTE — Telephone Encounter (Signed)
Per neurology he is still seeing them? Please let meknow

## 2018-11-18 NOTE — Telephone Encounter (Signed)
Yes patient still has his neurology appointment on 12/04/18.  Per their office he can have refill when he comes in to see them.

## 2018-11-18 NOTE — Progress Notes (Signed)
Carelink Summary Report / Loop Recorder 

## 2018-12-04 ENCOUNTER — Ambulatory Visit: Payer: Medicare Other | Admitting: Adult Health

## 2018-12-10 ENCOUNTER — Other Ambulatory Visit: Payer: Self-pay | Admitting: Family Medicine

## 2018-12-10 MED ORDER — FLUTICASONE PROPIONATE 50 MCG/ACT NA SUSP: 2.0000 | Freq: Every day | NASAL | 6 refills | Status: DC

## 2018-12-15 ENCOUNTER — Ambulatory Visit (INDEPENDENT_AMBULATORY_CARE_PROVIDER_SITE_OTHER): Payer: Medicare Other | Admitting: *Deleted

## 2018-12-15 ENCOUNTER — Other Ambulatory Visit: Payer: Self-pay

## 2018-12-15 DIAGNOSIS — I639 Cerebral infarction, unspecified: Secondary | ICD-10-CM | POA: Diagnosis not present

## 2018-12-15 LAB — CUP PACEART REMOTE DEVICE CHECK
Date Time Interrogation Session: 20200330101042
Implantable Pulse Generator Implant Date: 20180130

## 2018-12-20 ENCOUNTER — Other Ambulatory Visit: Payer: Self-pay | Admitting: Family Medicine

## 2018-12-24 NOTE — Progress Notes (Signed)
Carelink Summary Report / Loop Recorder 

## 2018-12-30 ENCOUNTER — Other Ambulatory Visit: Payer: Self-pay

## 2018-12-30 ENCOUNTER — Ambulatory Visit: Payer: Medicare Other | Admitting: Family Medicine

## 2019-01-27 ENCOUNTER — Other Ambulatory Visit: Payer: Self-pay

## 2019-01-27 ENCOUNTER — Ambulatory Visit (INDEPENDENT_AMBULATORY_CARE_PROVIDER_SITE_OTHER): Payer: Medicare Other | Admitting: *Deleted

## 2019-01-27 DIAGNOSIS — I639 Cerebral infarction, unspecified: Secondary | ICD-10-CM

## 2019-01-28 LAB — CUP PACEART REMOTE DEVICE CHECK
Date Time Interrogation Session: 20200512034015
Implantable Pulse Generator Implant Date: 20180130

## 2019-02-05 ENCOUNTER — Telehealth: Payer: Self-pay | Admitting: Neurology

## 2019-02-05 ENCOUNTER — Other Ambulatory Visit: Payer: Self-pay

## 2019-02-05 ENCOUNTER — Encounter: Payer: Self-pay | Admitting: Neurology

## 2019-02-05 ENCOUNTER — Ambulatory Visit (INDEPENDENT_AMBULATORY_CARE_PROVIDER_SITE_OTHER): Payer: Medicare Other | Admitting: Neurology

## 2019-02-05 DIAGNOSIS — I639 Cerebral infarction, unspecified: Secondary | ICD-10-CM

## 2019-02-05 DIAGNOSIS — I6529 Occlusion and stenosis of unspecified carotid artery: Secondary | ICD-10-CM | POA: Diagnosis not present

## 2019-02-05 MED ORDER — GABAPENTIN ENACARBIL ER 300 MG PO TBCR: 300.0000 | EXTENDED_RELEASE_TABLET | Freq: Two times a day (BID) | ORAL | 1 refills | Status: DC

## 2019-02-05 NOTE — Telephone Encounter (Signed)
I spoke to the patient and informed him that pramipexole and ropinirole are not useful for paresthesias.  I advised him to take gabapentin on a full stomach to delay its absorption and to space the 3 doses at least 8 hours apart to minimize peaks dose side effects.  He voiced understanding

## 2019-02-05 NOTE — Progress Notes (Signed)
Virtual Visit via Video Note  I connected with Paul Drake on 02/05/19 at  8:30 AM EDT by a video enabled telemedicine application and verified that I am speaking with the correct person using two identifiers.  This visit was performed using [email or web address] app  Location: Patient: At his home Provider: At [facility] office   I discussed the limitations of evaluation and management by telemedicine and the availability of in person appointments. The patient expressed understanding and agreed to proceed.  History of Present Illness: Paul Drake is seen today for virtual video follow-up visit following last office visit on 09/04/2018.  He continues to have left-sided paresthesias which are bothersome as it is his dominant hand.  He is currently on gabapentin 203 times daily which seems to help but it does make him sleepy and tired shortly after the dose and hence is reluctant to increase the dose further.  He is also on Cymbalta 60 mg daily which has also helped him.  Patient was referred by his primary physician to Dr. Metta Clines for a second opinion .he had an MRI scan of the brain which was done on 09/19/2018 which I have personally reviewed showed no new stroke but old large right MCA infarct with encephalomalacia and gliosis with mild chronic microvascular ischemic changes.  There were no acute findings.  He had vitamin B12 levels checked which were normal.  He denies any recurrent stroke or TIA symptoms.  He is tolerating Plavix well without bleeding or bruising.  He remains on Zocor 20 mg daily which is tolerating well and last lipid profile checked 6 months ago was satisfactory.  His hemoglobin A1c was also 5.6.  His blood pressure is well controlled he remains on Cozaar.  He has a loop recorder ends so for paroxysmal A. fib has not yet been found.  He finishes end-of-life of the loop recorder 3-years in January 2021.  He remains on a healthy diet and his wife does most of the cooking at home.  He has not gained any  weight.   Observations/Objective: Physical and neurological exams are limited due to constraints from video visit.  Pleasant elderly Caucasian male not in distress.  He is awake alert oriented to time place and person.  Speech and language appear normal.  Extraocular movements are full range without nystagmus.  Face appears symmetric.  Tongue is midline.  Motor system exam shows symmetric and equal strength without focal weakness.  Gait not tested.  Assessment  73 year old Caucasian male with past medical history of right parietal middle cerebral artery infarct in 2008 with recurrent small right MCA infarct in January 2016 of cryptogenic etiology.  And persistent post stroke left body paresthesias.  He has some benefit with gabapentin but having trouble tolerating due to side effects  Plan: I had a long discussion with the patient regarding his persistent post stroke paresthesias which have shown some response to gabapentin but he is having trouble tolerating it.  I recommend switching to gabapentin ER 300 mg twice daily to avoid peak dose side effects if he can afford it.  May increase further as tolerated.  Continue Cymbalta 60 mg daily.  He will remain on Plavix daily for stroke prevention and maintain strict control of hypertension with blood pressure goal below 130/90, lipids with LDL cholesterol goal below 70 mg percent and diabetes with hemoglobin A1c goal below 6.5%.  He was advised to continue to eat a healthy diet with lots of fruits, vegetables, cereals and whole grains as  well as be active and exercise 30 minutes daily.  Check screening carotid ultrasound study.  He will return for follow-up in 6 months with Janett Billow my nurse practitioner or call earlier if necessary. Follow Up Instructions: Change gabapentin 300 mg 3 times daily to gabapentin ER 300 twice daily Check carotid ultrasound study Return for follow-up in 6 months with Janett Billow nurse practitioner   I discussed the assessment and  treatment plan with the patient. The patient was provided an opportunity to ask questions and all were answered. The patient agreed with the plan and demonstrated an understanding of the instructions.   The patient was advised to call back or seek an in-person evaluation if the symptoms worsen or if the condition fails to improve as anticipated.  I provided 45minutes of non-face-to-face time during this encounter.   Antony Contras, MD

## 2019-02-05 NOTE — Telephone Encounter (Signed)
Pt has called to inform that the medication that Dr Leonie Man wanted to call in for him was not on pt's approved list.  Pt was told to call and ask if Pramipexole or Ropinirole could be considered for him. Please call

## 2019-02-10 NOTE — Telephone Encounter (Signed)
Gabapentin enacarbil ER PA denied not covered by patients insurance under the part d drug. It was denied. Contact number is [phone].

## 2019-02-10 NOTE — Telephone Encounter (Signed)
PA done on cover my meds for Gabapentin Enacarbil ER.

## 2019-02-11 ENCOUNTER — Ambulatory Visit: Payer: Medicare Other | Admitting: Adult Health

## 2019-02-13 ENCOUNTER — Telehealth: Payer: Self-pay | Admitting: Neurology

## 2019-02-13 NOTE — Telephone Encounter (Signed)
Pt is asking for a call back from RN re: the medication Dr Leonie Man called in for him to replace the gabapentin (NEURONTIN) 100 MG capsule Pt states [employer] has denied the replacement medication. Pt is asking for a call to discuss.(Pt is aware the office is not open on Fridays)

## 2019-02-13 NOTE — Progress Notes (Signed)
Carelink Summary Report / Loop Recorder 

## 2019-02-17 NOTE — Telephone Encounter (Signed)
Feb 05, 2019  Paul Fila, MD  to Me      5:25 PM  Note    I spoke to the patient and informed him that pramipexole and ropinirole are not useful for paresthesias.  I advised him to take gabapentin on a full stomach to delay its absorption and to space the 3 doses at least 8 hours apart to minimize peaks dose side effects.  He voiced understanding

## 2019-02-17 NOTE — Telephone Encounter (Signed)
Me       1:23 PM  Note    Gabapentin enacarbil ER PA denied not covered by patients insurance under the part d drug. It was denied. Contact number is [phone]

## 2019-02-17 NOTE — Telephone Encounter (Signed)
I called pt that his medication for horizon was denied. Pt wanted to know can Dr.SEthi appeal the decision for gabapentin enacarbil ER. Pt stated he is willing to wait for the appeal process. I stated message will be sent to Dr.Sethi. HE verbalized understanding.

## 2019-02-23 NOTE — Telephone Encounter (Addendum)
Appeal form and last year office notes fax to [phone]. All forms fax twice and confirmed. Appeal is for Horizon 300mg  ER tablets.

## 2019-02-24 ENCOUNTER — Ambulatory Visit (HOSPITAL_COMMUNITY): Payer: Medicare Other

## 2019-02-24 NOTE — Telephone Encounter (Signed)
Form for standard appeal fax to [phone] done by Dr.Sethi. Form fax twice and receive.

## 2019-03-02 ENCOUNTER — Ambulatory Visit (INDEPENDENT_AMBULATORY_CARE_PROVIDER_SITE_OTHER): Payer: Medicare Other | Admitting: *Deleted

## 2019-03-02 DIAGNOSIS — I639 Cerebral infarction, unspecified: Secondary | ICD-10-CM

## 2019-03-02 LAB — CUP PACEART REMOTE DEVICE CHECK
Date Time Interrogation Session: 20200614064404
Implantable Pulse Generator Implant Date: 20180130

## 2019-03-12 NOTE — Progress Notes (Signed)
Carelink Summary Report / Loop Recorder 

## 2019-03-20 ENCOUNTER — Other Ambulatory Visit: Payer: Self-pay | Admitting: Family Medicine

## 2019-03-20 ENCOUNTER — Other Ambulatory Visit: Payer: Self-pay | Admitting: Adult Health

## 2019-03-20 DIAGNOSIS — I6389 Other cerebral infarction: Secondary | ICD-10-CM

## 2019-04-01 NOTE — Telephone Encounter (Addendum)
Rn tried to call the appeals number twice at  [phone]. We have not receive a update on the horizant medication. No fax or letter has been sent form [employer]. I tried to call the number three times, it does not have a vm. Im unable to leave vm ,it states to try your call later.

## 2019-04-03 ENCOUNTER — Ambulatory Visit (INDEPENDENT_AMBULATORY_CARE_PROVIDER_SITE_OTHER): Payer: Medicare Other | Admitting: *Deleted

## 2019-04-03 DIAGNOSIS — I639 Cerebral infarction, unspecified: Secondary | ICD-10-CM

## 2019-04-03 LAB — CUP PACEART REMOTE DEVICE CHECK
Date Time Interrogation Session: 20200717062602
Implantable Pulse Generator Implant Date: 20180130

## 2019-04-08 DIAGNOSIS — N200 Calculus of kidney: Secondary | ICD-10-CM | POA: Diagnosis not present

## 2019-04-08 DIAGNOSIS — N401 Enlarged prostate with lower urinary tract symptoms: Secondary | ICD-10-CM | POA: Diagnosis not present

## 2019-04-08 DIAGNOSIS — N5201 Erectile dysfunction due to arterial insufficiency: Secondary | ICD-10-CM | POA: Diagnosis not present

## 2019-04-08 DIAGNOSIS — R350 Frequency of micturition: Secondary | ICD-10-CM | POA: Diagnosis not present

## 2019-04-08 NOTE — Progress Notes (Signed)
Carelink Summary Report / Loop Recorder 

## 2019-04-20 NOTE — Telephone Encounter (Addendum)
I called another number for appeals at [phone] to find if Horizant medicationwas approve at that level. I was told the appeal letter and notes were receive on 02/24/2019 and it was denied on 6/112020. I stated the letter was never fax or mail to our office. They will refax to [phone] that the appeal was denied.

## 2019-04-20 NOTE — Telephone Encounter (Signed)
Fax receive on why appeal was denied for Horizant. Medicare allows them to cover drug when it is a Part D drug that is use for medically accepted medication. Horizant is not FDA Approve for post stroke paresthesia, neuropathic pain, and left hand numbness and tingling. The conditions are not one of the uses for the drug listed. The decision does not concern the medical necessity.

## 2019-05-07 ENCOUNTER — Ambulatory Visit (INDEPENDENT_AMBULATORY_CARE_PROVIDER_SITE_OTHER): Payer: Medicare Other | Admitting: *Deleted

## 2019-05-07 DIAGNOSIS — I63411 Cerebral infarction due to embolism of right middle cerebral artery: Secondary | ICD-10-CM

## 2019-05-07 LAB — CUP PACEART REMOTE DEVICE CHECK
Date Time Interrogation Session: 20200819114127
Implantable Pulse Generator Implant Date: 20180130

## 2019-05-12 DIAGNOSIS — D485 Neoplasm of uncertain behavior of skin: Secondary | ICD-10-CM | POA: Diagnosis not present

## 2019-05-12 DIAGNOSIS — L820 Inflamed seborrheic keratosis: Secondary | ICD-10-CM | POA: Diagnosis not present

## 2019-05-12 DIAGNOSIS — C44219 Basal cell carcinoma of skin of left ear and external auricular canal: Secondary | ICD-10-CM | POA: Diagnosis not present

## 2019-05-12 DIAGNOSIS — L821 Other seborrheic keratosis: Secondary | ICD-10-CM | POA: Diagnosis not present

## 2019-05-12 DIAGNOSIS — L570 Actinic keratosis: Secondary | ICD-10-CM | POA: Diagnosis not present

## 2019-05-14 NOTE — Progress Notes (Signed)
Virtual Visit via Video Note  I connected with patient on 05/18/19 at 10:00 AM EDT by audio enabled telemedicine application and verified that I am speaking with the correct person using two identifiers.   THIS ENCOUNTER IS A VIRTUAL VISIT DUE TO COVID-19 - PATIENT WAS NOT SEEN IN THE OFFICE. PATIENT HAS CONSENTED TO VIRTUAL VISIT / TELEMEDICINE VISIT   Location of patient: home  Location of provider: office  I discussed the limitations of evaluation and management by telemedicine and the availability of in person appointments. The patient expressed understanding and agreed to proceed.   Subjective:   Paul Drake is a 73 y.o. male who presents for Medicare Annual/Subsequent preventive examination.  Review of Systems:  Cardiac Risk Factors include: advanced age (>45men, >5 women);dyslipidemia;male gender;hypertension  Home Safety/Smoke Alarms: Feels safe in home. Smoke alarms in place.  Lives with wife and dog in 2 story town home. No issue with stairs. Walk-in shower.  Male:   CCS-  Per pt 09/30/13-normal   PSA- No results found for: PSA      Objective:    Vitals: Unable to assess. This visit is enabled though telemedicine due to Covid 19.   Advanced Directives 05/18/2019 05/16/2018 10/16/2016 10/15/2016 09/14/2016 06/16/2015 11/24/2014  Does Patient Have a Medical Advance Directive? Yes Yes Yes Yes Yes Yes Yes  Type of Paramedic of [place];Living will [place];Living will [place];Living will [place];Living will [place];Living will - -  Does patient want to make changes to medical advance directive? No - Patient declined - No - Patient declined - - - -  Copy of [place] in Chart? No - copy requested Yes No - copy requested No - copy requested - - -    Tobacco Social History   Tobacco Use  Smoking Status Never Smoker  Smokeless Tobacco Never Used    Counseling given: Not Answered   Clinical Intake: Pain : No/denies pain    Past Medical History:  Diagnosis Date  . Allergic state 12/29/2013  . Arthritis   . Barrett's esophagus   . BPH (benign prostatic hyperplasia)   . Chicken pox   . CVA (cerebral infarction)   . Depression   . GERD (gastroesophageal reflux disease)   . HTN (hypertension), benign 04/01/2015  . Hyperglycemia 01/06/2015  . Hyperlipemia   . Hypertension   . IBS (irritable bowel syndrome)   . Kidney stones   . Left hand pain 07/21/2017  . Measles as a child  . Medicare annual wellness visit, subsequent 06/26/2015  . Mumps as a child  . Otitis externa 12/10/2013  . Seasonal allergies    some asthma  . Stroke ([place])   . Unspecified asthma(493.90) 12/29/2013   Past Surgical History:  Procedure Laterality Date  . COLONOSCOPY    . EP IMPLANTABLE DEVICE N/A 10/16/2016   Procedure: Loop Recorder Insertion;  Surgeon: Thompson Grayer, MD;  Location: [facility];  Service: Cardiovascular;  Laterality: N/A;  . LITHOTRIPSY     multiple times  . PROSTATE SURGERY  08/2017  . TEE WITHOUT CARDIOVERSION N/A 09/22/2014   Procedure: TRANSESOPHAGEAL ECHOCARDIOGRAM (TEE);  Surgeon: Josue Hector, MD;  Location: [facility] ENDOSCOPY;  Service: Cardiovascular;  Laterality: N/A;  . WISDOM TOOTH EXTRACTION     Family History  Problem Relation Age of Onset  . Hypertension Mother   . Hyperlipidemia Mother   . Fibromyalgia Mother   . Arthritis Mother  rheumatoid  . Diabetes Sister        type 2  . Hyperlipidemia Brother   . Hypertension Brother   . Ulcers Father 36       Bleeding Ulcers  . Kidney Stones Daughter   . Asthma Daughter   . Healthy Son   . Cancer Maternal Grandfather        skin ?  . Stroke Maternal Aunt   . Cancer Maternal Uncle        prostate   Social History   Socioeconomic History  . Marital status: Married    Spouse name: Webb Silversmith  . Number of children: 3  . Years of education: college  .  Highest education level: Master's degree (e.g., MA, MS, MEng, MEd, MSW, MBA)  Occupational History  . Occupation: retired    Comment: retired  Scientific laboratory technician  . Financial resource strain: Not on file  . Food insecurity    Worry: Not on file    Inability: Not on file  . Transportation needs    Medical: Not on file    Non-medical: Not on file  Tobacco Use  . Smoking status: Never Smoker  . Smokeless tobacco: Never Used  Substance and Sexual Activity  . Alcohol use: Yes    Alcohol/week: 1.0 standard drinks    Types: 1 Shots of liquor per week    Comment: 2 old fashions per evening  . Drug use: No  . 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 Physical activity    Days per week: Not on file    Minutes per session: Not on file  . Stress: Not on file  Relationships  . Social Herbalist on phone: Not on file    Gets together: Not on file    Attends religious service: Not on file    Active member of club or organization: Not on file    Attends meetings of clubs or organizations: Not on file    Relationship status: Not on file  Other Topics Concern  . Not on file  Social History Narrative   Patient consumes 3 cups of caffeine daily   Left handed   Lives at home with his wife. They are living in their daughter's house to help to help take care of their granddaughter.      Patient uses a home gym 3-4 x a week.    Outpatient Encounter Medications as of 05/18/2019  Medication Sig  . albuterol (PROVENTIL HFA;VENTOLIN HFA) 108 (90 Base) MCG/ACT inhaler Inhale 2 puffs into the lungs every 6 (six) hours as needed for wheezing or shortness of breath.  . clopidogrel (PLAVIX) 75 MG tablet TAKE 1 TABLET(75 MG) BY MOUTH DAILY  . DULoxetine (CYMBALTA) 60 MG capsule Take 1 capsule (60 mg total) by mouth daily.  . fluticasone (FLONASE) 50 MCG/ACT nasal spray Place 2 sprays into both nostrils daily.  Marland Kitchen gabapentin  (NEURONTIN) 100 MG capsule TAKE 2 CAPSULES(200 MG) BY MOUTH THREE TIMES DAILY  . losartan (COZAAR) 100 MG tablet TAKE 1 TABLET(100 MG) BY MOUTH DAILY AS DIRECTED  . Multiple Vitamins-Minerals (MULTIVITAMINS THER. W/MINERALS) TABS tablet Take 1 tablet by mouth daily.  . Omega-3 Fatty Acids (OMEGA-3 PO) Take 1 capsule by mouth daily.  . pantoprazole (PROTONIX) 40 MG tablet TAKE 1 TABLET(40 MG) BY MOUTH DAILY  . Simethicone (GAS-X PO) Take 2 tablets by mouth daily as needed (gas).  Marland Kitchen  simvastatin (ZOCOR) 20 MG tablet TAKE 1 TABLET BY MOUTH DAILY  . clonazePAM (KLONOPIN) 0.5 MG tablet Take 1 tablet (0.5 mg total) by mouth at bedtime. (Patient not taking: Reported on 05/18/2019)   No facility-administered encounter medications on file as of 05/18/2019.     Activities of Daily Living In your present state of health, do you have any difficulty performing the following activities: 05/18/2019  Hearing? N  Vision? N  Difficulty concentrating or making decisions? N  Walking or climbing stairs? N  Dressing or bathing? N  Doing errands, shopping? N  Preparing Food and eating ? N  Using the Toilet? N  In the past six months, have you accidently leaked urine? N  Do you have problems with loss of bowel control? N  Managing your Medications? N  Managing your Finances? N  Housekeeping or managing your Housekeeping? N  Some recent data might be hidden    Patient Care Team: Mosie Lukes, MD as PCP - General (Family Medicine) Rosalin Hawking, MD as Consulting Physician (Neurology) Christy Sartorius, MD as Referring Physician (Urology) Ladene Artist, MD as Consulting Physician (Gastroenterology) Sheryn Bison, MD as Referring Physician (Dermatology)   Assessment:   This is a routine wellness examination for [place]. Physical assessment deferred to PCP.  Exercise Activities and Dietary recommendations Current Exercise Habits: Home exercise routine, Type of exercise: walking, Time (Minutes): 20,  Frequency (Times/Week): 7, Weekly Exercise (Minutes/Week): 140, Exercise limited by: None identified Diet (meal preparation, eat out, water intake, caffeinated beverages, dairy products, fruits and vegetables): in general, a "healthy" diet  , well balanced   Goals    . Maintain healthy lifestyle.       Fall Risk Fall Risk  05/18/2019 09/04/2018 05/16/2018 01/27/2018 01/14/2018  Falls in the past year? 1 1 Yes Yes Yes  Number falls in past yr: 1 0 2 or more 2 or more 2 or more  Injury with Fall? 0 0 Yes No No  Risk Factor Category  - - - High Fall Risk -  Risk for fall due to : Impaired balance/gait - History of fall(s);Impaired balance/gait Other (Comment);Impaired balance/gait Impaired mobility  Risk for fall due to: Comment - - - vertigo, weakness L leg, pt denies assistive device -  Follow up Education provided;Falls prevention discussed - Education provided;Falls prevention discussed Education provided;Falls evaluation completed Falls prevention discussed    Depression Screen PHQ 2/9 Scores 05/18/2019 05/16/2018 01/14/2018 10/15/2016  PHQ - 2 Score 1 0 0 0    Cognitive Function Ad8 score reviewed for issues:  Issues making decisions:no  Less interest in hobbies / activities:no  Repeats questions, stories (family complaining):no  Trouble using ordinary gadgets (microwave, computer, phone):no  Forgets the month or year: no  Mismanaging finances: no  Remembering appts:no  Daily problems with thinking and/or memory:no Ad8 score is=0   MMSE - Mini Mental State Exam 10/15/2016  Orientation to time 5  Orientation to Place 5  Registration 3  Attention/ Calculation 5  Recall 3  Language- name 2 objects 2  Language- repeat 1  Language- follow 3 step command 3  Language- read & follow direction 1  Write a sentence 1  Copy design 1  Total score 30        Immunization History  Administered Date(s) Administered  . Influenza, High Dose Seasonal PF 06/23/2017, 07/01/2018   . Influenza,inj,Quad PF,6+ Mos 05/17/2014, 05/20/2015, 07/05/2016  . Influenza-Unspecified 06/17/2013  . Pneumococcal Conjugate-13 10/15/2016  . Pneumococcal-Unspecified 06/17/2013  .  Tdap 11/11/2013    Screening Tests Health Maintenance  Topic Date Due  . INFLUENZA VACCINE  04/18/2019  . COLONOSCOPY  10/01/2023  . TETANUS/TDAP  11/12/2023  . Hepatitis C Screening  Completed  . PNA vac Low Risk Adult  Completed       Plan:   See you next year!  Continue to eat heart healthy diet (full of fruits, vegetables, whole grains, lean protein, water--limit salt, fat, and sugar intake) and increase physical activity as tolerated.  Continue doing brain stimulating activities (puzzles, reading, adult coloring books, staying active) to keep memory sharp.   Bring a copy of your living will and/or healthcare power of attorney to your next office visit.   I have personally reviewed and noted the following in the patient's chart:   . Medical and social history . Use of alcohol, tobacco or illicit drugs  . Current medications and supplements . Functional ability and status . Nutritional status . Physical activity . Advanced directives . List of other physicians . Hospitalizations, surgeries, and ER visits in previous 12 months . Vitals . Screenings to include cognitive, depression, and falls . Referrals and appointments  In addition, I have reviewed and discussed with patient certain preventive protocols, quality metrics, and best practice recommendations. A written personalized care plan for preventive services as well as general preventive health recommendations were provided to patient.     Shela Nevin, [place]  05/18/2019

## 2019-05-15 NOTE — Progress Notes (Signed)
Carelink Summary Report / Loop Recorder 

## 2019-05-18 ENCOUNTER — Other Ambulatory Visit: Payer: Self-pay | Admitting: *Deleted

## 2019-05-18 ENCOUNTER — Encounter: Payer: Self-pay | Admitting: *Deleted

## 2019-05-18 ENCOUNTER — Other Ambulatory Visit: Payer: Self-pay

## 2019-05-18 ENCOUNTER — Ambulatory Visit (INDEPENDENT_AMBULATORY_CARE_PROVIDER_SITE_OTHER): Payer: Medicare Other | Admitting: *Deleted

## 2019-05-18 DIAGNOSIS — Z0000 Encounter for general adult medical examination without abnormal findings: Secondary | ICD-10-CM

## 2019-05-18 MED ORDER — GABAPENTIN 100 MG PO CAPS: ORAL_CAPSULE | ORAL | 2 refills | Status: DC

## 2019-05-18 NOTE — Patient Instructions (Addendum)
See you next year!  Continue to eat heart healthy diet (full of fruits, vegetables, whole grains, lean protein, water--limit salt, fat, and sugar intake) and increase physical activity as tolerated.  Continue doing brain stimulating activities (puzzles, reading, adult coloring books, staying active) to keep memory sharp.   Bring a copy of your living will and/or healthcare power of attorney to your next office visit.   Paul Drake , Thank you for taking time to come for your Medicare Wellness Visit. I appreciate your ongoing commitment to your health goals. Please review the following plan we discussed and let me know if I can assist you in the future.   These are the goals we discussed: Goals    . Maintain healthy lifestyle.       This is a list of the screening recommended for you and due dates:  Health Maintenance  Topic Date Due  . Flu Shot  04/18/2019  . Colon Cancer Screening  10/01/2023  . Tetanus Vaccine  11/12/2023  .  Hepatitis C: One time screening is recommended by Center for Disease Control  (CDC) for  adults born from [birth year] through 1967.   Completed  . Pneumonia vaccines  Completed    Health Maintenance After Age 1 After age 37, you are at a higher risk for certain long-term diseases and infections as well as injuries from falls. Falls are a major cause of broken bones and head injuries in people who are older than age 13. Getting regular preventive care can help to keep you healthy and well. Preventive care includes getting regular testing and making lifestyle changes as recommended by your health care provider. Talk with your health care provider about:  Which screenings and tests you should have. A screening is a test that checks for a disease when you have no symptoms.  A diet and exercise plan that is right for you. What should I know about screenings and tests to prevent falls? Screening and testing are the best ways to find a health problem early. Early diagnosis  and treatment give you the best chance of managing medical conditions that are common after age 54. Certain conditions and lifestyle choices may make you more likely to have a fall. Your health care provider may recommend:  Regular vision checks. Poor vision and conditions such as cataracts can make you more likely to have a fall. If you wear glasses, make sure to get your prescription updated if your vision changes.  Medicine review. Work with your health care provider to regularly review all of the medicines you are taking, including over-the-counter medicines. Ask your health care provider about any side effects that may make you more likely to have a fall. Tell your health care provider if any medicines that you take make you feel dizzy or sleepy.  Osteoporosis screening. Osteoporosis is a condition that causes the bones to get weaker. This can make the bones weak and cause them to break more easily.  Blood pressure screening. Blood pressure changes and medicines to control blood pressure can make you feel dizzy.  Strength and balance checks. Your health care provider may recommend certain tests to check your strength and balance while standing, walking, or changing positions.  Foot health exam. Foot pain and numbness, as well as not wearing proper footwear, can make you more likely to have a fall.  Depression screening. You may be more likely to have a fall if you have a fear of falling, feel emotionally low, or feel  unable to do activities that you used to do.  Alcohol use screening. Using too much alcohol can affect your balance and may make you more likely to have a fall. What actions can I take to lower my risk of falls? General instructions  Talk with your health care provider about your risks for falling. Tell your health care provider if: ? You fall. Be sure to tell your health care provider about all falls, even ones that seem minor. ? You feel dizzy, sleepy, or off-balance.   Take over-the-counter and prescription medicines only as told by your health care provider. These include any supplements.  Eat a healthy diet and maintain a healthy weight. A healthy diet includes low-fat dairy products, low-fat (lean) meats, and fiber from whole grains, beans, and lots of fruits and vegetables. Home safety  Remove any tripping hazards, such as rugs, cords, and clutter.  Install safety equipment such as grab bars in bathrooms and safety rails on stairs.  Keep rooms and walkways well-lit. Activity   Follow a regular exercise program to stay fit. This will help you maintain your balance. Ask your health care provider what types of exercise are appropriate for you.  If you need a cane or walker, use it as recommended by your health care provider.  Wear supportive shoes that have nonskid soles. Lifestyle  Do not drink alcohol if your health care provider tells you not to drink.  If you drink alcohol, limit how much you have: ? 0-1 drink a day for women. ? 0-2 drinks a day for men.  Be aware of how much alcohol is in your drink. In the [place] one drink equals one typical bottle of beer (12 oz), one-half glass of wine (5 oz), or one shot of hard liquor (1 oz).  Do not use any products that contain nicotine or tobacco, such as cigarettes and e-cigarettes. If you need help quitting, ask your health care provider. Summary  Having a healthy lifestyle and getting preventive care can help to protect your health and wellness after age 77.  Screening and testing are the best way to find a health problem early and help you avoid having a fall. Early diagnosis and treatment give you the best chance for managing medical conditions that are more common for people who are older than age 74.  Falls are a major cause of broken bones and head injuries in people who are older than age 72. Take precautions to prevent a fall at home.  Work with your health care provider to learn what  changes you can make to improve your health and wellness and to prevent falls. This information is not intended to replace advice given to you by your health care provider. Make sure you discuss any questions you have with your health care provider. Document Released: 07/17/2017 Document Revised: 12/25/2018 Document Reviewed: 07/17/2017 Elsevier Patient Education  2020 [employer]. See you next year!  Continue to eat heart healthy diet (full of fruits, vegetables, whole grains, lean protein, water--limit salt, fat, and sugar intake) and increase physical activity as tolerated.  Continue doing brain stimulating activities (puzzles, reading, adult coloring books, staying active) to keep memory sharp.   Bring a copy of your living will and/or healthcare power of attorney to your next office visit.   Paul Drake , Thank you for taking time to come for your Medicare Wellness Visit. I appreciate your ongoing commitment to your health goals. Please review the following plan we discussed and let  me know if I can assist you in the future.   These are the goals we discussed: Goals    . Maintain healthy lifestyle.       This is a list of the screening recommended for you and due dates:  Health Maintenance  Topic Date Due  . Flu Shot  04/18/2019  . Colon Cancer Screening  10/01/2023  . Tetanus Vaccine  11/12/2023  .  Hepatitis C: One time screening is recommended by Center for Disease Control  (CDC) for  adults born from [birth year] through 1967.   Completed  . Pneumonia vaccines  Completed    Health Maintenance After Age 56 After age 41, you are at a higher risk for certain long-term diseases and infections as well as injuries from falls. Falls are a major cause of broken bones and head injuries in people who are older than age 70. Getting regular preventive care can help to keep you healthy and well. Preventive care includes getting regular testing and making lifestyle changes as recommended by your  health care provider. Talk with your health care provider about:  Which screenings and tests you should have. A screening is a test that checks for a disease when you have no symptoms.  A diet and exercise plan that is right for you. What should I know about screenings and tests to prevent falls? Screening and testing are the best ways to find a health problem early. Early diagnosis and treatment give you the best chance of managing medical conditions that are common after age 75. Certain conditions and lifestyle choices may make you more likely to have a fall. Your health care provider may recommend:  Regular vision checks. Poor vision and conditions such as cataracts can make you more likely to have a fall. If you wear glasses, make sure to get your prescription updated if your vision changes.  Medicine review. Work with your health care provider to regularly review all of the medicines you are taking, including over-the-counter medicines. Ask your health care provider about any side effects that may make you more likely to have a fall. Tell your health care provider if any medicines that you take make you feel dizzy or sleepy.  Osteoporosis screening. Osteoporosis is a condition that causes the bones to get weaker. This can make the bones weak and cause them to break more easily.  Blood pressure screening. Blood pressure changes and medicines to control blood pressure can make you feel dizzy.  Strength and balance checks. Your health care provider may recommend certain tests to check your strength and balance while standing, walking, or changing positions.  Foot health exam. Foot pain and numbness, as well as not wearing proper footwear, can make you more likely to have a fall.  Depression screening. You may be more likely to have a fall if you have a fear of falling, feel emotionally low, or feel unable to do activities that you used to do.  Alcohol use screening. Using too much alcohol can  affect your balance and may make you more likely to have a fall. What actions can I take to lower my risk of falls? General instructions  Talk with your health care provider about your risks for falling. Tell your health care provider if: ? You fall. Be sure to tell your health care provider about all falls, even ones that seem minor. ? You feel dizzy, sleepy, or off-balance.  Take over-the-counter and prescription medicines only as told by your health care  provider. These include any supplements.  Eat a healthy diet and maintain a healthy weight. A healthy diet includes low-fat dairy products, low-fat (lean) meats, and fiber from whole grains, beans, and lots of fruits and vegetables. Home safety  Remove any tripping hazards, such as rugs, cords, and clutter.  Install safety equipment such as grab bars in bathrooms and safety rails on stairs.  Keep rooms and walkways well-lit. Activity   Follow a regular exercise program to stay fit. This will help you maintain your balance. Ask your health care provider what types of exercise are appropriate for you.  If you need a cane or walker, use it as recommended by your health care provider.  Wear supportive shoes that have nonskid soles. Lifestyle  Do not drink alcohol if your health care provider tells you not to drink.  If you drink alcohol, limit how much you have: ? 0-1 drink a day for women. ? 0-2 drinks a day for men.  Be aware of how much alcohol is in your drink. In the [place] one drink equals one typical bottle of beer (12 oz), one-half glass of wine (5 oz), or one shot of hard liquor (1 oz).  Do not use any products that contain nicotine or tobacco, such as cigarettes and e-cigarettes. If you need help quitting, ask your health care provider. Summary  Having a healthy lifestyle and getting preventive care can help to protect your health and wellness after age 69.  Screening and testing are the best way to find a health  problem early and help you avoid having a fall. Early diagnosis and treatment give you the best chance for managing medical conditions that are more common for people who are older than age 14.  Falls are a major cause of broken bones and head injuries in people who are older than age 72. Take precautions to prevent a fall at home.  Work with your health care provider to learn what changes you can make to improve your health and wellness and to prevent falls. This information is not intended to replace advice given to you by your health care provider. Make sure you discuss any questions you have with your health care provider. Document Released: 07/17/2017 Document Revised: 12/25/2018 Document Reviewed: 07/17/2017 Elsevier Patient Education  2020 [employer].

## 2019-05-27 DIAGNOSIS — N132 Hydronephrosis with renal and ureteral calculous obstruction: Secondary | ICD-10-CM | POA: Diagnosis not present

## 2019-05-27 DIAGNOSIS — N201 Calculus of ureter: Secondary | ICD-10-CM | POA: Diagnosis not present

## 2019-05-28 ENCOUNTER — Telehealth: Payer: Self-pay | Admitting: Neurology

## 2019-05-28 ENCOUNTER — Other Ambulatory Visit: Payer: Self-pay | Admitting: Urology

## 2019-05-28 NOTE — Telephone Encounter (Signed)
I called Pam that a clearance form can be fax to [phone].Marland Kitchen Pam will fax form today with return fax and contact person.

## 2019-05-28 NOTE — Telephone Encounter (Signed)
[email or web address](Dr [email or web address]'s [email or web address]) has called asking that pt be able to come off clopidogrel (PLAVIX) 75 MG tablet for 5 days for a surgery pt is scheduled to have on 09-18(Kidney stones) Pam can be reached at [phone]

## 2019-05-29 ENCOUNTER — Encounter (HOSPITAL_COMMUNITY): Payer: Self-pay

## 2019-05-29 NOTE — Patient Instructions (Signed)
DUE TO COVID-19 ONLY ONE VISITOR IS ALLOWED TO COME WITH YOU AND STAY IN THE WAITING ROOM ONLY DURING PRE OP AND PROCEDURE DAY OF SURGERY. THE 1 VISITOR MAY VISIT WITH YOU AFTER SURGERY IN YOUR PRIVATE ROOM DURING VISITING HOURS ONLY!  YOU NEED TO HAVE A COVID 19 TEST ON_______ @_______ , THIS TEST MUST BE DONE BEFORE SURGERY, COME  [place] , [address].  ([place]) ONCE YOUR COVID TEST IS COMPLETED, PLEASE BEGIN THE QUARANTINE INSTRUCTIONS AS OUTLINED IN YOUR HANDOUT.                [name]  05/29/2019   Your procedure is scheduled on: 06-05-19   Report to [facility] Main  Entrance             Report to admitting at      1000 AM     Call this number if you have problems the morning of surgery [phone]    Remember: Do not eat food or drink liquids :After Midnight. BRUSH YOUR TEETH MORNING OF SURGERY AND RINSE YOUR MOUTH OUT, NO CHEWING GUM CANDY OR MINTS.     Take these medicines the morning of surgery with A SIP OF WATER: protonix, gabapentin, cymbalta, zyrtec, alfuzosin, inhaler and bring with you                                You may not have any metal on your body including hair pins and              piercings  Do not wear jewelry, lotions, powders or perfumes, deodorant                  Men may shave face and neck.   Do not bring valuables to the hospital. [place].  Contacts, dentures or bridgework may not be worn into surgery.      Patients discharged the day of surgery will not be allowed to drive home. IF YOU ARE HAVING SURGERY AND GOING HOME THE SAME DAY, YOU MUST HAVE AN ADULT TO DRIVE YOU HOME AND BE WITH YOU FOR 24 HOURS. YOU MAY GO HOME BY TAXI OR UBER OR ORTHERWISE, BUT AN ADULT MUST ACCOMPANY YOU HOME AND STAY WITH YOU FOR 24 HOURS.  Name and phone number of your driver:  Special Instructions: N/A              Please read over the following fact sheets you  were given: _____________________________________________________________________             [facility] - Preparing for Surgery Before surgery, you can play an important role.  Because skin is not sterile, your skin needs to be as free of germs as possible.  You can reduce the number of germs on your skin by washing with CHG (chlorahexidine gluconate) soap before surgery.  CHG is an antiseptic cleaner which kills germs and bonds with the skin to continue killing germs even after washing. Please DO NOT use if you have an allergy to CHG or antibacterial soaps.  If your skin becomes reddened/irritated stop using the CHG and inform your nurse when you arrive at Short Stay. Do not shave (including legs and underarms) for at least 48 hours prior to the first CHG shower.  You  may shave your face/neck. Please follow these instructions carefully:  1.  Shower with CHG Soap the night before surgery and the  morning of Surgery.  2.  If you choose to wash your hair, wash your hair first as usual with your  normal  shampoo.  3.  After you shampoo, rinse your hair and body thoroughly to remove the  shampoo.                           4.  Use CHG as you would any other liquid soap.  You can apply chg directly  to the skin and wash                       Gently with a scrungie or clean washcloth.  5.  Apply the CHG Soap to your body ONLY FROM THE NECK DOWN.   Do not use on face/ open                           Wound or open sores. Avoid contact with eyes, ears mouth and genitals (private parts).                       Wash face,  Genitals (private parts) with your normal soap.             6.  Wash thoroughly, paying special attention to the area where your surgery  will be performed.  7.  Thoroughly rinse your body with warm water from the neck down.  8.  DO NOT shower/wash with your normal soap after using and rinsing off  the CHG Soap.                9.  Pat yourself dry with a clean towel.            10.  Wear  clean pajamas.            11.  Place clean sheets on your bed the night of your first shower and do not  sleep with pets. Day of Surgery : Do not apply any lotions/deodorants the morning of surgery.  Please wear clean clothes to the hospital/surgery center.  FAILURE TO FOLLOW THESE INSTRUCTIONS MAY RESULT IN THE CANCELLATION OF YOUR SURGERY PATIENT SIGNATURE_________________________________  NURSE SIGNATURE__________________________________  ________________________________________________________________________

## 2019-06-01 ENCOUNTER — Inpatient Hospital Stay (HOSPITAL_COMMUNITY)
Admission: RE | Admit: 2019-06-01 | Discharge: 2019-06-01 | Disposition: A | Discharge status: Home / Self Care | Payer: Medicare Other | Source: Ambulatory Visit

## 2019-06-01 HISTORY — DX: Personal history of urinary calculi: Z87.442

## 2019-06-01 NOTE — Telephone Encounter (Signed)
Clearance form fax to [phone]. Form fax three times and confirmed.

## 2019-06-01 NOTE — Telephone Encounter (Signed)
Vm left for Paul Drake at [facility] that clearance form was fax three times to [phone]. Request on vm to call back that she did receive clearance form.

## 2019-06-01 NOTE — Progress Notes (Signed)
LVM with Coni at [facility]. Stated he cancelled his surgery for 06-05-19

## 2019-06-02 DIAGNOSIS — H524 Presbyopia: Secondary | ICD-10-CM | POA: Diagnosis not present

## 2019-06-02 DIAGNOSIS — H0288B Meibomian gland dysfunction left eye, upper and lower eyelids: Secondary | ICD-10-CM | POA: Diagnosis not present

## 2019-06-02 DIAGNOSIS — Z79899 Other long term (current) drug therapy: Secondary | ICD-10-CM | POA: Diagnosis not present

## 2019-06-02 DIAGNOSIS — H35363 Drusen (degenerative) of macula, bilateral: Secondary | ICD-10-CM | POA: Diagnosis not present

## 2019-06-02 DIAGNOSIS — H2513 Age-related nuclear cataract, bilateral: Secondary | ICD-10-CM | POA: Diagnosis not present

## 2019-06-02 DIAGNOSIS — H0288A Meibomian gland dysfunction right eye, upper and lower eyelids: Secondary | ICD-10-CM | POA: Diagnosis not present

## 2019-06-02 DIAGNOSIS — H43813 Vitreous degeneration, bilateral: Secondary | ICD-10-CM | POA: Diagnosis not present

## 2019-06-02 DIAGNOSIS — H52209 Unspecified astigmatism, unspecified eye: Secondary | ICD-10-CM | POA: Diagnosis not present

## 2019-06-02 DIAGNOSIS — H5203 Hypermetropia, bilateral: Secondary | ICD-10-CM | POA: Diagnosis not present

## 2019-06-05 ENCOUNTER — Ambulatory Visit (HOSPITAL_COMMUNITY): Admission: RE | Admit: 2019-06-05 | Payer: Medicare Other | Source: Home / Self Care | Admitting: Urology

## 2019-06-05 ENCOUNTER — Encounter (HOSPITAL_COMMUNITY): Admission: RE | Payer: Self-pay | Source: Home / Self Care

## 2019-06-05 SURGERY — CYSTOSCOPY/URETEROSCOPY/HOLMIUM LASER/STENT PLACEMENT
Anesthesia: General | Laterality: Left

## 2019-06-08 ENCOUNTER — Ambulatory Visit (INDEPENDENT_AMBULATORY_CARE_PROVIDER_SITE_OTHER): Payer: Medicare Other | Admitting: *Deleted

## 2019-06-08 DIAGNOSIS — I63411 Cerebral infarction due to embolism of right middle cerebral artery: Secondary | ICD-10-CM | POA: Diagnosis not present

## 2019-06-09 LAB — CUP PACEART REMOTE DEVICE CHECK
Date Time Interrogation Session: 20200921134125
Implantable Pulse Generator Implant Date: 20180130

## 2019-06-16 ENCOUNTER — Ambulatory Visit (INDEPENDENT_AMBULATORY_CARE_PROVIDER_SITE_OTHER): Payer: Medicare Other | Admitting: Family Medicine

## 2019-06-16 ENCOUNTER — Other Ambulatory Visit: Payer: Self-pay

## 2019-06-16 DIAGNOSIS — I10 Essential (primary) hypertension: Secondary | ICD-10-CM

## 2019-06-16 DIAGNOSIS — I639 Cerebral infarction, unspecified: Secondary | ICD-10-CM

## 2019-06-16 DIAGNOSIS — F418 Other specified anxiety disorders: Secondary | ICD-10-CM | POA: Diagnosis not present

## 2019-06-16 DIAGNOSIS — E7801 Familial hypercholesterolemia: Secondary | ICD-10-CM

## 2019-06-16 DIAGNOSIS — R739 Hyperglycemia, unspecified: Secondary | ICD-10-CM | POA: Diagnosis not present

## 2019-06-16 NOTE — Assessment & Plan Note (Signed)
hgba1c acceptable, minimize simple carbs. Increase exercise as tolerated.  

## 2019-06-16 NOTE — Assessment & Plan Note (Signed)
Encouraged heart healthy diet, increase exercise, avoid trans fats, consider a krill oil cap daily 

## 2019-06-16 NOTE — Progress Notes (Signed)
Carelink Summary Report / Loop Recorder 

## 2019-06-16 NOTE — Progress Notes (Signed)
Patient ID: Paul Drake, male   DOB: October 09, 1945, 73 y.o.   MRN: [id number] Virtual Visit via Video Note  I connected with [name] on 06/16/19 at  3:40 PM EDT by a video enabled telemedicine application and verified that I am speaking with the correct person using two identifiers.  Location: Patient: home Provider: office   I discussed the limitations of evaluation and management by telemedicine and the availability of in person appointments. The patient expressed understanding and agreed to proceed. Magdalene Molly, CMA was able to set patient up on video visit   Subjective:    Patient ID: Paul Drake, male    DOB: October 27, 1945, 73 y.o.   MRN: [id number]  No chief complaint on file.   HPI Patient is in today for follow up on chronic medical concerns including hyperlipidemia, hyperglycemia, hypertension, depression and more. They have recently moved into a new townhouse and it went well. He denies any recent febrile illness or hospitalizations. No polyuria or polydipsia. He is frustrated and anxious about the pandemic and politics but his life is going well otherwise. Denies CP/palp/SOB/HA/congestion/fevers/GI or GU c/o. Taking meds as prescribed  Past Medical History:  Diagnosis Date  . Allergic state 12/29/2013  . Arthritis   . Barrett's esophagus   . BPH (benign prostatic hyperplasia)   . Chicken pox   . CVA (cerebral infarction)   . Depression   . GERD (gastroesophageal reflux disease)   . History of kidney stones   . HTN (hypertension), benign 04/01/2015  . Hyperglycemia 01/06/2015  . Hyperlipemia   . Hypertension   . IBS (irritable bowel syndrome)   . Left hand pain 07/21/2017  . Measles as a child  . Medicare annual wellness visit, subsequent 06/26/2015  . Mumps as a child  . Otitis externa 12/10/2013  . Seasonal allergies    some asthma  . Stroke ([place])   . Unspecified asthma(493.90) 12/29/2013    Past Surgical History:  Procedure Laterality Date  . COLONOSCOPY     . EP IMPLANTABLE DEVICE N/A 10/16/2016   Procedure: Loop Recorder Insertion;  Surgeon: Thompson Grayer, MD;  Location: [place] [facility];  Service: Cardiovascular;  Laterality: N/A;  . LITHOTRIPSY     multiple times  . PROSTATE SURGERY  08/2017  . TEE WITHOUT CARDIOVERSION N/A 09/22/2014   Procedure: TRANSESOPHAGEAL ECHOCARDIOGRAM (TEE);  Surgeon: Josue Hector, MD;  Location: [facility] ENDOSCOPY;  Service: Cardiovascular;  Laterality: N/A;  . WISDOM TOOTH EXTRACTION      Family History  Problem Relation Age of Onset  . Hypertension Mother   . Hyperlipidemia Mother   . Fibromyalgia Mother   . Arthritis Mother        rheumatoid  . Diabetes Sister        type 2  . Hyperlipidemia Brother   . Hypertension Brother   . Ulcers Father 36       Bleeding Ulcers  . Kidney Stones Daughter   . Asthma Daughter   . Healthy Son   . Cancer Maternal Grandfather        skin ?  . Stroke Maternal Aunt   . Cancer Maternal Uncle        prostate    Social History   Socioeconomic History  . Marital status: Married    Spouse name: Webb Silversmith  . Number of children: 3  . Years of education: college  . Highest education level: Master's degree (e.g., MA, MS, MEng, MEd, MSW, MBA)  Occupational History  . Occupation:  retired    Comment: retired  Scientific laboratory technician  . Financial resource strain: Not on file  . Food insecurity    Worry: Not on file    Inability: Not on file  . Transportation needs    Medical: Not on file    Non-medical: Not on file  Tobacco Use  . Smoking status: Never Smoker  . Smokeless tobacco: Never Used  Substance and Sexual Activity  . Alcohol use: Yes    Alcohol/week: 1.0 standard drinks    Types: 1 Shots of liquor per week    Comment: 2 old fashions per evening  . Drug use: No  . 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 Physical activity    Days per week: Not on file    Minutes per session: Not on file  .  Stress: Not on file  Relationships  . Social Herbalist on phone: Not on file    Gets together: Not on file    Attends religious service: Not on file    Active member of club or organization: Not on file    Attends meetings of clubs or organizations: Not on file    Relationship status: Not on file  . Intimate partner violence    Fear of current or ex partner: Not on file    Emotionally abused: Not on file    Physically abused: Not on file    Forced sexual activity: Not on file  Other Topics Concern  . Not on file  Social History Narrative   Patient consumes 3 cups of caffeine daily   Left handed   Lives at home with his wife. They are living in their daughter's house to help to help take care of their granddaughter.      Patient uses a home gym 3-4 x a week.    Outpatient Medications Prior to Visit  Medication Sig Dispense Refill  . albuterol (PROVENTIL HFA;VENTOLIN HFA) 108 (90 Base) MCG/ACT inhaler Inhale 2 puffs into the lungs every 6 (six) hours as needed for wheezing or shortness of breath. 1 Inhaler 3  . alfuzosin (UROXATRAL) 10 MG 24 hr tablet Take 10 mg by mouth daily with breakfast.    . cetirizine (ZYRTEC) 10 MG tablet Take 10 mg by mouth daily.    . clopidogrel (PLAVIX) 75 MG tablet TAKE 1 TABLET(75 MG) BY MOUTH DAILY (Patient taking differently: Take 75 mg by mouth daily. ) 90 tablet 3  . DULoxetine (CYMBALTA) 60 MG capsule Take 1 capsule (60 mg total) by mouth daily. 90 capsule 3  . fluticasone (FLONASE) 50 MCG/ACT nasal spray Place 2 sprays into both nostrils daily. (Patient taking differently: Place 2 sprays into both nostrils daily as needed for allergies. ) 16 g 6  . gabapentin (NEURONTIN) 100 MG capsule TAKE 2 CAPSULES(200 MG) BY MOUTH THREE TIMES DAILY (Patient taking differently: Take 200 mg by mouth 3 (three) times daily. ) 180 capsule 2  . losartan (COZAAR) 100 MG tablet TAKE 1 TABLET(100 MG) BY MOUTH DAILY AS DIRECTED (Patient taking differently: Take  100 mg by mouth every evening. ) 90 tablet 1  . Menthol, Topical Analgesic, (BIOFREEZE EX) Apply 1 application topically daily as needed (hand pain).    . Multiple Vitamins-Minerals (MULTIVITAMINS THER. W/MINERALS) TABS tablet Take 1 tablet by mouth daily.    . Omega-3 Fatty Acids (OMEGA-3 PO) Take 350 mg by mouth daily.     Marland Kitchen  pantoprazole (PROTONIX) 40 MG tablet TAKE 1 TABLET(40 MG) BY MOUTH DAILY (Patient taking differently: Take 40 mg by mouth daily. ) 90 tablet 1  . simvastatin (ZOCOR) 20 MG tablet TAKE 1 TABLET BY MOUTH DAILY (Patient taking differently: Take 20 mg by mouth every evening. ) 90 tablet 0  . clonazePAM (KLONOPIN) 0.5 MG tablet Take 1 tablet (0.5 mg total) by mouth at bedtime. (Patient not taking: Reported on 05/18/2019) 30 tablet 3   No facility-administered medications prior to visit.     Allergies  Allergen Reactions  . Molds & Smuts Other (See Comments)  . Pollen Extract Other (See Comments)    Runny nose,itchy eyes    Review of Systems  Constitutional: Negative for fever and malaise/fatigue.  HENT: Negative for congestion.   Eyes: Negative for blurred vision.  Respiratory: Negative for shortness of breath.   Cardiovascular: Negative for chest pain, palpitations and leg swelling.  Gastrointestinal: Negative for abdominal pain, blood in stool and nausea.  Genitourinary: Negative for dysuria and frequency.  Musculoskeletal: Negative for falls.  Skin: Negative for rash.  Neurological: Negative for dizziness, loss of consciousness and headaches.  Endo/Heme/Allergies: Negative for environmental allergies.  Psychiatric/Behavioral: Negative for depression. The patient is nervous/anxious.        Objective:    Physical Exam Constitutional:      Appearance: Normal appearance. He is not ill-appearing.  HENT:     Head: Normocephalic and atraumatic.  Eyes:     General:        Right eye: No discharge.        Left eye: No discharge.  Pulmonary:     Effort:  Pulmonary effort is normal.  Neurological:     Mental Status: He is alert and oriented to person, place, and time.  Psychiatric:        Mood and Affect: Mood normal.        Behavior: Behavior normal.     There were no vitals taken for this visit. Wt Readings from Last 3 Encounters:  10/08/18 164 lb (74.4 kg)  09/23/18 169 lb 9.6 oz (76.9 kg)  09/04/18 166 lb 8 oz (75.5 kg)    Diabetic Foot Exam - Simple   No data filed     Lab Results  Component Value Date   WBC 5.1 08/26/2018   HGB 13.9 08/26/2018   HCT 41.4 08/26/2018   PLT 176.0 08/26/2018   GLUCOSE 108 (H) 08/26/2018   CHOL 167 08/26/2018   TRIG 104.0 08/26/2018   HDL 71.90 08/26/2018   LDLDIRECT 76.0 12/31/2014   LDLCALC 74 08/26/2018   ALT 11 08/26/2018   AST 17 08/26/2018   NA 142 08/26/2018   K 4.7 08/26/2018   CL 105 08/26/2018   CREATININE 1.07 08/26/2018   BUN 20 08/26/2018   CO2 32 08/26/2018   TSH 2.31 08/26/2018   INR 0.98 09/20/2014   HGBA1C 5.6 08/26/2018    Lab Results  Component Value Date   TSH 2.31 08/26/2018   Lab Results  Component Value Date   WBC 5.1 08/26/2018   HGB 13.9 08/26/2018   HCT 41.4 08/26/2018   MCV 96.7 08/26/2018   PLT 176.0 08/26/2018   Lab Results  Component Value Date   NA 142 08/26/2018   K 4.7 08/26/2018   CO2 32 08/26/2018   GLUCOSE 108 (H) 08/26/2018   BUN 20 08/26/2018   CREATININE 1.07 08/26/2018   BILITOT 0.8 08/26/2018   ALKPHOS 59 08/26/2018   AST 17 08/26/2018  ALT 11 08/26/2018   PROT 6.7 08/26/2018   ALBUMIN 4.4 08/26/2018   CALCIUM 9.8 08/26/2018   ANIONGAP 7 09/14/2016   GFR 71.76 08/26/2018   Lab Results  Component Value Date   CHOL 167 08/26/2018   Lab Results  Component Value Date   HDL 71.90 08/26/2018   Lab Results  Component Value Date   LDLCALC 74 08/26/2018   Lab Results  Component Value Date   TRIG 104.0 08/26/2018   Lab Results  Component Value Date   CHOLHDL 2 08/26/2018   Lab Results  Component Value Date    HGBA1C 5.6 08/26/2018       Assessment & Plan:   Problem List Items Addressed This Visit    Depression with anxiety    He notes the pandemic and the current social climate are overwhelming at times but otherwise life is good so he is managing well most of the time. Continue current meds      Essential hypertension - Primary    Monitor vitals weekly,  no changes to meds. Encouraged heart healthy diet such as the DASH diet and exercise as tolerated.       Relevant Orders   CBC   Comprehensive metabolic panel   TSH   Hyperglycemia    hgba1c acceptable, minimize simple carbs. Increase exercise as tolerated.      Relevant Orders   Hemoglobin A1c   HLD (hyperlipidemia)    Encouraged heart healthy diet, increase exercise, avoid trans fats, consider a krill oil cap daily      Relevant Orders   Lipid panel      I am having [name] maintain his multivitamins ther. w/minerals, Omega-3 Fatty Acids (OMEGA-3 PO), clonazePAM, albuterol, fluticasone, losartan, DULoxetine, pantoprazole, simvastatin, clopidogrel, gabapentin, alfuzosin, cetirizine, and (Menthol, Topical Analgesic, (BIOFREEZE EX)).  No orders of the defined types were placed in this encounter.    I discussed the assessment and treatment plan with the patient. The patient was provided an opportunity to ask questions and all were answered. The patient agreed with the plan and demonstrated an understanding of the instructions.   The patient was advised to call back or seek an in-person evaluation if the symptoms worsen or if the condition fails to improve as anticipated.  I provided 25 minutes of non-face-to-face time during this encounter.   Penni Homans, MD

## 2019-06-16 NOTE — Assessment & Plan Note (Signed)
Monitor vitals weekly,  no changes to meds. Encouraged heart healthy diet such as the DASH diet and exercise as tolerated.

## 2019-06-16 NOTE — Assessment & Plan Note (Signed)
He notes the pandemic and the current social climate are overwhelming at times but otherwise life is good so he is managing well most of the time. Continue current meds

## 2019-06-17 ENCOUNTER — Ambulatory Visit (INDEPENDENT_AMBULATORY_CARE_PROVIDER_SITE_OTHER): Payer: Medicare Other

## 2019-06-17 ENCOUNTER — Other Ambulatory Visit: Payer: Self-pay

## 2019-06-17 ENCOUNTER — Other Ambulatory Visit: Payer: Self-pay | Admitting: Family Medicine

## 2019-06-17 ENCOUNTER — Other Ambulatory Visit (INDEPENDENT_AMBULATORY_CARE_PROVIDER_SITE_OTHER): Payer: Medicare Other

## 2019-06-17 DIAGNOSIS — E7801 Familial hypercholesterolemia: Secondary | ICD-10-CM

## 2019-06-17 DIAGNOSIS — Z23 Encounter for immunization: Secondary | ICD-10-CM

## 2019-06-17 DIAGNOSIS — R739 Hyperglycemia, unspecified: Secondary | ICD-10-CM

## 2019-06-17 DIAGNOSIS — I10 Essential (primary) hypertension: Secondary | ICD-10-CM | POA: Diagnosis not present

## 2019-06-17 LAB — COMPREHENSIVE METABOLIC PANEL
ALT: 11 U/L (ref 0–53)
AST: 16 U/L (ref 0–37)
Albumin: 4.3 g/dL (ref 3.5–5.2)
Alkaline Phosphatase: 61 U/L (ref 39–117)
BUN: 18 mg/dL (ref 6–23)
CO2: 30 mEq/L (ref 19–32)
Calcium: 9.4 mg/dL (ref 8.4–10.5)
Chloride: 105 mEq/L (ref 96–112)
Creatinine, Ser: 1.11 mg/dL (ref 0.40–1.50)
GFR: 64.57 mL/min (ref >60.00)
Glucose, Bld: 137 mg/dL — ABNORMAL HIGH (ref 70–99)
Potassium: 4 mEq/L (ref 3.5–5.1)
Sodium: 142 mEq/L (ref 135–145)
Total Bilirubin: 0.9 mg/dL (ref 0.2–1.2)
Total Protein: 6.5 g/dL (ref 6.0–8.3)

## 2019-06-17 LAB — CBC
HCT: 39.8 % (ref 39.0–52.0)
Hemoglobin: 13.1 g/dL (ref 13.0–17.0)
MCHC: 33 g/dL (ref 30.0–36.0)
MCV: 99.3 fl (ref 78.0–100.0)
Platelets: 146 10*3/uL — ABNORMAL LOW (ref 150.0–400.0)
RBC: 4.01 Mil/uL — ABNORMAL LOW (ref 4.22–5.81)
RDW: 13 % (ref 11.5–15.5)
WBC: 5.3 10*3/uL (ref 4.0–10.5)

## 2019-06-17 LAB — LIPID PANEL
Cholesterol: 153 mg/dL (ref 0–200)
HDL: 78.3 mg/dL (ref >39.00)
LDL Cholesterol: 62 mg/dL (ref 0–99)
NonHDL: 74.33
Total CHOL/HDL Ratio: 2
Triglycerides: 64 mg/dL (ref 0.0–149.0)
VLDL: 12.8 mg/dL (ref 0.0–40.0)

## 2019-06-17 LAB — TSH: TSH: 1.62 u[IU]/mL (ref 0.35–4.50)

## 2019-06-17 LAB — HEMOGLOBIN A1C: Hgb A1c MFr Bld: 5.5 % (ref 4.6–6.5)

## 2019-06-17 NOTE — Telephone Encounter (Signed)
Please advise 

## 2019-06-17 NOTE — Telephone Encounter (Signed)
Pt calling in.  States that he would like to get something for anxiety.  States that he had a virtual visit for this yesterday but thought he was okay.  Pt states that today he is not and would like to have something for his anxiety.   Pt denies being a harm to himself or others. Pt uses  [id number] - [place], [place] - [address] AT [place] [address] [phone] (Phone) [phone] (Fax)

## 2019-06-17 NOTE — Telephone Encounter (Signed)
He can have Alprazolam 0.25 mg tabs, 1/2 to 1 tab po daily prn anxiety Disp #20 but warn him to use it sparingly because it can get hard to get off of if he takes it regular then set him up with a follow up visit with me in 2-3 weeks to see how this helped and see if this is enough or if we have to change his cymbalta also

## 2019-06-18 ENCOUNTER — Other Ambulatory Visit: Payer: Self-pay | Admitting: Family Medicine

## 2019-06-19 MED ORDER — ALPRAZOLAM 0.25 MG PO TABS: 0.2500 mg | ORAL_TABLET | Freq: Every evening | ORAL | 0 refills | Status: DC | PRN

## 2019-06-19 NOTE — Telephone Encounter (Signed)
Spoke with patient about medication he has agreed and scheduled appt for a follow up  Please send meds in

## 2019-06-19 NOTE — Telephone Encounter (Signed)
Left detailed message for patient letting him know that we would send something in for him to take and he needed to give me a call to get him scheduled

## 2019-07-03 ENCOUNTER — Other Ambulatory Visit: Payer: Self-pay

## 2019-07-03 ENCOUNTER — Ambulatory Visit (INDEPENDENT_AMBULATORY_CARE_PROVIDER_SITE_OTHER): Payer: Medicare Other | Admitting: Family Medicine

## 2019-07-03 DIAGNOSIS — J45909 Unspecified asthma, uncomplicated: Secondary | ICD-10-CM

## 2019-07-03 DIAGNOSIS — I10 Essential (primary) hypertension: Secondary | ICD-10-CM | POA: Diagnosis not present

## 2019-07-03 DIAGNOSIS — F418 Other specified anxiety disorders: Secondary | ICD-10-CM | POA: Diagnosis not present

## 2019-07-03 DIAGNOSIS — I639 Cerebral infarction, unspecified: Secondary | ICD-10-CM | POA: Diagnosis not present

## 2019-07-03 DIAGNOSIS — R739 Hyperglycemia, unspecified: Secondary | ICD-10-CM

## 2019-07-03 MED ORDER — ALPRAZOLAM 0.25 MG PO TABS: 0.2500 mg | ORAL_TABLET | Freq: Two times a day (BID) | ORAL | 0 refills | Status: DC | PRN

## 2019-07-05 NOTE — Assessment & Plan Note (Signed)
He reports improvement since last visit. He is tolerating medications and has only needed one Alprazolam and had no concerning side effects.

## 2019-07-05 NOTE — Assessment & Plan Note (Signed)
hgba1c acceptable, minimize simple carbs. Increase exercise as tolerated.  

## 2019-07-05 NOTE — Progress Notes (Signed)
Virtual Visit via phone Note  I connected with Paul Drake on 07/03/19 at  2:40 PM EDT by a phone enabled telemedicine application and verified that I am speaking with the correct person using two identifiers.  Location: Patient: home Provider: home   I discussed the limitations of evaluation and management by telemedicine and the availability of in person appointments. The patient expressed understanding and agreed to proceed. Magdalene Molly, CMA was able to set the patient up on a phone visit after being unable to set up a video visit.    Subjective:    Patient ID: Paul Drake, male    DOB: 04/21/1946, 73 y.o.   MRN: [id number]  No chief complaint on file.   HPI Patient is in today for follow pu on chronic medical concerns including depression, anxiety, hypertension and more. He is doing much better since his last visit. He has only needed one alprazolam and did not have any concerning side effects. He is maintaining quarantine but is still stressed about the state of the world. Denies CP/palp/SOB/HA/congestion/fevers/GI or GU c/o. Taking meds as prescribed  Past Medical History:  Diagnosis Date  . Allergic state 12/29/2013  . Arthritis   . Barrett's esophagus   . BPH (benign prostatic hyperplasia)   . Chicken pox   . CVA (cerebral infarction)   . Depression   . GERD (gastroesophageal reflux disease)   . History of kidney stones   . HTN (hypertension), benign 04/01/2015  . Hyperglycemia 01/06/2015  . Hyperlipemia   . Hypertension   . IBS (irritable bowel syndrome)   . Left hand pain 07/21/2017  . Measles as a child  . Medicare annual wellness visit, subsequent 06/26/2015  . Mumps as a child  . Otitis externa 12/10/2013  . Seasonal allergies    some asthma  . Stroke ([place])   . Unspecified asthma(493.90) 12/29/2013    Past Surgical History:  Procedure Laterality Date  . COLONOSCOPY    . EP IMPLANTABLE DEVICE N/A 10/16/2016   Procedure: Loop Recorder Insertion;  Surgeon:  Thompson Grayer, MD;  Location: [place] [facility];  Service: Cardiovascular;  Laterality: N/A;  . LITHOTRIPSY     multiple times  . PROSTATE SURGERY  08/2017  . TEE WITHOUT CARDIOVERSION N/A 09/22/2014   Procedure: TRANSESOPHAGEAL ECHOCARDIOGRAM (TEE);  Surgeon: Josue Hector, MD;  Location: [facility] ENDOSCOPY;  Service: Cardiovascular;  Laterality: N/A;  . WISDOM TOOTH EXTRACTION      Family History  Problem Relation Age of Onset  . Hypertension Mother   . Hyperlipidemia Mother   . Fibromyalgia Mother   . Arthritis Mother        rheumatoid  . Diabetes Sister        type 2  . Hyperlipidemia Brother   . Hypertension Brother   . Ulcers Father 36       Bleeding Ulcers  . Kidney Stones Daughter   . Asthma Daughter   . Healthy Son   . Cancer Maternal Grandfather        skin ?  . Stroke Maternal Aunt   . Cancer Maternal Uncle        prostate    Social History   Socioeconomic History  . Marital status: Married    Spouse name: Webb Silversmith  . Number of children: 3  . Years of education: college  . Highest education level: Master's degree (e.g., MA, MS, MEng, MEd, MSW, MBA)  Occupational History  . Occupation: retired    Comment: retired  Science writer  Needs  . Financial resource strain: Not on file  . Food insecurity    Worry: Not on file    Inability: Not on file  . Transportation needs    Medical: Not on file    Non-medical: Not on file  Tobacco Use  . Smoking status: Never Smoker  . Smokeless tobacco: Never Used  Substance and Sexual Activity  . Alcohol use: Yes    Alcohol/week: 1.0 standard drinks    Types: 1 Shots of liquor per week    Comment: 2 old fashions per evening  . Drug use: No  . 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 Physical activity    Days per week: Not on file    Minutes per session: Not on file  . Stress: Not on file  Relationships  . Social Herbalist on phone: Not on file     Gets together: Not on file    Attends religious service: Not on file    Active member of club or organization: Not on file    Attends meetings of clubs or organizations: Not on file    Relationship status: Not on file  . Intimate partner violence    Fear of current or ex partner: Not on file    Emotionally abused: Not on file    Physically abused: Not on file    Forced sexual activity: Not on file  Other Topics Concern  . Not on file  Social History Narrative   Patient consumes 3 cups of caffeine daily   Left handed   Lives at home with his wife. They are living in their daughter's house to help to help take care of their granddaughter.      Patient uses a home gym 3-4 x a week.    Outpatient Medications Prior to Visit  Medication Sig Dispense Refill  . albuterol (PROVENTIL HFA;VENTOLIN HFA) 108 (90 Base) MCG/ACT inhaler Inhale 2 puffs into the lungs every 6 (six) hours as needed for wheezing or shortness of breath. 1 Inhaler 3  . alfuzosin (UROXATRAL) 10 MG 24 hr tablet Take 10 mg by mouth daily with breakfast.    . cetirizine (ZYRTEC) 10 MG tablet Take 10 mg by mouth daily.    . clonazePAM (KLONOPIN) 0.5 MG tablet Take 1 tablet (0.5 mg total) by mouth at bedtime. (Patient not taking: Reported on 05/18/2019) 30 tablet 3  . clopidogrel (PLAVIX) 75 MG tablet TAKE 1 TABLET(75 MG) BY MOUTH DAILY (Patient taking differently: Take 75 mg by mouth daily. ) 90 tablet 3  . DULoxetine (CYMBALTA) 60 MG capsule Take 1 capsule (60 mg total) by mouth daily. 90 capsule 3  . fluticasone (FLONASE) 50 MCG/ACT nasal spray Place 2 sprays into both nostrils daily. (Patient taking differently: Place 2 sprays into both nostrils daily as needed for allergies. ) 16 g 6  . gabapentin (NEURONTIN) 100 MG capsule TAKE 2 CAPSULES(200 MG) BY MOUTH THREE TIMES DAILY (Patient taking differently: Take 200 mg by mouth 3 (three) times daily. ) 180 capsule 2  . losartan (COZAAR) 100 MG tablet TAKE 1 TABLET(100 MG) BY MOUTH  DAILY AS DIRECTED (Patient taking differently: Take 100 mg by mouth every evening. ) 90 tablet 1  . Menthol, Topical Analgesic, (BIOFREEZE EX) Apply 1 application topically daily as needed (hand pain).    . Multiple Vitamins-Minerals (MULTIVITAMINS THER. W/MINERALS) TABS tablet Take 1 tablet by mouth daily.    Marland Kitchen  Omega-3 Fatty Acids (OMEGA-3 PO) Take 350 mg by mouth daily.     . pantoprazole (PROTONIX) 40 MG tablet TAKE 1 TABLET(40 MG) BY MOUTH DAILY (Patient taking differently: Take 40 mg by mouth daily. ) 90 tablet 1  . simvastatin (ZOCOR) 20 MG tablet TAKE 1 TABLET BY MOUTH DAILY 90 tablet 1  . ALPRAZolam (XANAX) 0.25 MG tablet Take 1 tablet (0.25 mg total) by mouth at bedtime as needed for anxiety. 20 tablet 0   No facility-administered medications prior to visit.     Allergies  Allergen Reactions  . Molds & Smuts Other (See Comments)  . Pollen Extract Other (See Comments)    Runny nose,itchy eyes    Review of Systems  Constitutional: Negative for fever and malaise/fatigue.  HENT: Negative for congestion.   Eyes: Negative for blurred vision.  Respiratory: Negative for shortness of breath.   Cardiovascular: Negative for chest pain, palpitations and leg swelling.  Gastrointestinal: Negative for abdominal pain, blood in stool and nausea.  Genitourinary: Negative for dysuria and frequency.  Musculoskeletal: Negative for falls.  Skin: Negative for rash.  Neurological: Negative for dizziness, loss of consciousness and headaches.  Endo/Heme/Allergies: Negative for environmental allergies.  Psychiatric/Behavioral: Negative for depression. The patient is not nervous/anxious.        Objective:    Physical Exam  There were no vitals taken for this visit. Wt Readings from Last 3 Encounters:  10/08/18 164 lb (74.4 kg)  09/23/18 169 lb 9.6 oz (76.9 kg)  09/04/18 166 lb 8 oz (75.5 kg)    Diabetic Foot Exam - Simple   No data filed     Lab Results  Component Value Date   WBC 5.3  06/17/2019   HGB 13.1 06/17/2019   HCT 39.8 06/17/2019   PLT 146.0 (L) 06/17/2019   GLUCOSE 137 (H) 06/17/2019   CHOL 153 06/17/2019   TRIG 64.0 06/17/2019   HDL 78.30 06/17/2019   LDLDIRECT 76.0 12/31/2014   LDLCALC 62 06/17/2019   ALT 11 06/17/2019   AST 16 06/17/2019   NA 142 06/17/2019   K 4.0 06/17/2019   CL 105 06/17/2019   CREATININE 1.11 06/17/2019   BUN 18 06/17/2019   CO2 30 06/17/2019   TSH 1.62 06/17/2019   INR 0.98 09/20/2014   HGBA1C 5.5 06/17/2019    Lab Results  Component Value Date   TSH 1.62 06/17/2019   Lab Results  Component Value Date   WBC 5.3 06/17/2019   HGB 13.1 06/17/2019   HCT 39.8 06/17/2019   MCV 99.3 06/17/2019   PLT 146.0 (L) 06/17/2019   Lab Results  Component Value Date   NA 142 06/17/2019   K 4.0 06/17/2019   CO2 30 06/17/2019   GLUCOSE 137 (H) 06/17/2019   BUN 18 06/17/2019   CREATININE 1.11 06/17/2019   BILITOT 0.9 06/17/2019   ALKPHOS 61 06/17/2019   AST 16 06/17/2019   ALT 11 06/17/2019   PROT 6.5 06/17/2019   ALBUMIN 4.3 06/17/2019   CALCIUM 9.4 06/17/2019   ANIONGAP 7 09/14/2016   GFR 64.57 06/17/2019   Lab Results  Component Value Date   CHOL 153 06/17/2019   Lab Results  Component Value Date   HDL 78.30 06/17/2019   Lab Results  Component Value Date   LDLCALC 62 06/17/2019   Lab Results  Component Value Date   TRIG 64.0 06/17/2019   Lab Results  Component Value Date   CHOLHDL 2 06/17/2019   Lab Results  Component Value  Date   HGBA1C 5.5 06/17/2019       Assessment & Plan:   Problem List Items Addressed This Visit    Depression with anxiety    He reports improvement since last visit. He is tolerating medications and has only needed one Alprazolam and had no concerning side effects.       Relevant Medications   ALPRAZolam (XANAX) 0.25 MG tablet   Essential hypertension    He is monitoring at home and will report any concerns. no changes to meds. Encouraged heart healthy diet such as the  DASH diet and exercise as tolerated.       Hyperglycemia    hgba1c acceptable, minimize simple carbs. Increase exercise as tolerated.       Asthma    No recent flare.          I have changed Bar[place] Pond's ALPRAZolam. I am also having him maintain his multivitamins ther. w/minerals, Omega-3 Fatty Acids (OMEGA-3 PO), clonazePAM, albuterol, fluticasone, losartan, DULoxetine, pantoprazole, clopidogrel, gabapentin, alfuzosin, cetirizine, (Menthol, Topical Analgesic, (BIOFREEZE EX)), and simvastatin.  Meds ordered this encounter  Medications  . ALPRAZolam (XANAX) 0.25 MG tablet    Sig: Take 1 tablet (0.25 mg total) by mouth 2 (two) times daily as needed for anxiety.    Dispense:  20 tablet    Refill:  0     I discussed the assessment and treatment plan with the patient. The patient was provided an opportunity to ask questions and all were answered. The patient agreed with the plan and demonstrated an understanding of the instructions.   The patient was advised to call back or seek an in-person evaluation if the symptoms worsen or if the condition fails to improve as anticipated.  I provided 25 minutes of non-face-to-face time during this encounter.   Penni Homans, MD

## 2019-07-05 NOTE — Assessment & Plan Note (Signed)
He is monitoring at home and will report any concerns. no changes to meds. Encouraged heart healthy diet such as the DASH diet and exercise as tolerated.

## 2019-07-05 NOTE — Assessment & Plan Note (Signed)
No recent flare.  

## 2019-07-06 ENCOUNTER — Telehealth: Payer: Self-pay | Admitting: Family Medicine

## 2019-07-06 NOTE — Telephone Encounter (Signed)
Pt wanted to notify Dr. Charlett Blake that over the weekend he started having kidney stone pains and blood in urine. He has appt 10/20 with [facility]. He states he's had kidney stones frequently since about 73 years old.   No f/u needed.

## 2019-07-06 NOTE — Telephone Encounter (Signed)
FYI

## 2019-07-07 DIAGNOSIS — R8279 Other abnormal findings on microbiological examination of urine: Secondary | ICD-10-CM | POA: Diagnosis not present

## 2019-07-07 DIAGNOSIS — N202 Calculus of kidney with calculus of ureter: Secondary | ICD-10-CM | POA: Diagnosis not present

## 2019-07-12 LAB — CUP PACEART REMOTE DEVICE CHECK
Date Time Interrogation Session: 20201024133900
Implantable Pulse Generator Implant Date: 20180130

## 2019-07-13 ENCOUNTER — Ambulatory Visit (INDEPENDENT_AMBULATORY_CARE_PROVIDER_SITE_OTHER): Payer: Medicare Other | Admitting: *Deleted

## 2019-07-13 DIAGNOSIS — I63411 Cerebral infarction due to embolism of right middle cerebral artery: Secondary | ICD-10-CM

## 2019-07-14 DIAGNOSIS — C44219 Basal cell carcinoma of skin of left ear and external auricular canal: Secondary | ICD-10-CM | POA: Diagnosis not present

## 2019-07-31 NOTE — Progress Notes (Signed)
Carelink Summary Report / Loop Recorder 

## 2019-08-10 ENCOUNTER — Other Ambulatory Visit: Payer: Self-pay

## 2019-08-10 ENCOUNTER — Ambulatory Visit (INDEPENDENT_AMBULATORY_CARE_PROVIDER_SITE_OTHER): Payer: Medicare Other | Admitting: Adult Health

## 2019-08-10 ENCOUNTER — Telehealth: Payer: Self-pay | Admitting: Adult Health

## 2019-08-10 ENCOUNTER — Encounter: Payer: Self-pay | Admitting: Adult Health

## 2019-08-10 VITALS — BP 126/73 | HR 72 | Temp 97.4°F | Ht 67.5 in | Wt 176.2 lb

## 2019-08-10 DIAGNOSIS — I10 Essential (primary) hypertension: Secondary | ICD-10-CM | POA: Diagnosis not present

## 2019-08-10 DIAGNOSIS — R202 Paresthesia of skin: Secondary | ICD-10-CM | POA: Diagnosis not present

## 2019-08-10 DIAGNOSIS — I639 Cerebral infarction, unspecified: Secondary | ICD-10-CM | POA: Diagnosis not present

## 2019-08-10 DIAGNOSIS — I6529 Occlusion and stenosis of unspecified carotid artery: Secondary | ICD-10-CM | POA: Diagnosis not present

## 2019-08-10 DIAGNOSIS — E785 Hyperlipidemia, unspecified: Secondary | ICD-10-CM

## 2019-08-10 MED ORDER — NONFORMULARY OR COMPOUNDED ITEM: 3 refills | Status: DC

## 2019-08-10 NOTE — Telephone Encounter (Signed)
Pharmcist  [place] has called asking for a call from [employer] to discuss creme prescribed by NP Janett Billow.  Please call her at [phone]

## 2019-08-10 NOTE — Progress Notes (Signed)
STROKE NEUROLOGY FOLLOW UP NOTE  NAME: Paul Drake DOB: Jun 13, 1946  REASON FOR VISIT: stroke follow up HISTORY FROM: pt and chart  No chief complaint on file.    History Summary Paul Drake is a LH 73 y.o. male with a history of right MCA infarct in 2008 with residue LLE minimal weakness was admitted on 09/20/14 for acute onset left sided weakness and difficulty with speech.he received tPA. His acute right frontal stroke is adjacent to your previous stroke in 2008 shown in MRI. Stroke work up showed PFO with valsalva on TEE and positive right LE DVT. He was put on Xarelto 15mg  twice a day for 21 days and then 20mg  daily for 5 months. He mentioned that before this happened he also had some diarrhea and he might have done valsalva maneuvers then.   Patient's previous stroke was in 2008 and the patient has a resultant LLE numbness and weakness. Patient was on ASA but stopped it about a month ago due to gastritis.He denies N/V. However, shortly after the gastritis, he developed kidney stone. He may have a lot of pain which caused him to have valsalva maneuvers.   09/26/16 follow up - pt had kidney stone surgery in 07/2016 and off plavix for 5-7 days and currently plavix resumed. However, on 09/14/16, he was walking on the street, had sudden onset staggering, imbalance and kept leaning towards right. He felt dizzy, but not vertigo, no fall, no double vision or speech difficulty. No vomiting but mild nausea. It lasted 15-20 sec and resolved. He managed to get home. Wife called EMS and he was sent to ED. CT head negative. He refused further study and come today for follow up. Denies leg pain or swelling. BP 104/72  01/16/17 follow up - LE venous doppler showed no DVT, and CTA head and neck unremarkable. Discussed with Dr. Rayann Heman and loop recorder placed on 10/16/16.   01/27/18 visit JX: During the interval time, patient has been doing stable.  Follow with PCP, restarted Zocor 20  and losartan for BP  control.  Today BP 122/75.  Still complains of left fingers burning/stinging/hurting currently onwith decreased sensation, constant.  Still on night dose Neurontin, not able to tolerate daytime Neurontin due to drowsiness.  Patient also complaining of violent dreams, acting out of dreams, even ruling out of bed with concussion.  Happening twice per week and average 3 dreams per night.  He does have history of sleepwalking when he was young.   05/01/18 visit: Patient is being seen today for routine scheduled office visit for stroke follow-up.  He continues to do well from a stroke standpoint.  After previous visit, patient was placed on Cymbalta due to continued nerve pain in left hand and he states that he has obtained great benefit from this medication as he has very limited amount of pain in his hand.  He does state with increased activity he will have some pain but even this has improved.  He does continue to take Neurontin 300 mg at night as he is unable to tolerate during the day due to making him too fatigued.  He also start taking melatonin 15 mg for REM behavior disorder as recommended at previous visit.  He states since starting this, he has not been as violent during his dreams or throwing himself out of the bed but he does still continue to have violent dreams with some acting out.  Patient continues to take Plavix without side effects of bleeding and bruising.  Continues to take simvastatin without side effects of myalgias.  Loop recorder has not shown atrial fibrillation thus far.  Upon assessment, heart rate found to be irregular with patient asymptomatic.  Will contact cardiology to obtain strip during this time to ensure this is not atrial fibrillation.  Blood pressure today satisfactory 119/81.  Patient denies new or worsening stroke/TIA symptoms.  Update 09/04/2018: Patient has requested to be seen today due to increased balance issues and generalized weakness with increased fatigue after  walking short distances.  He states approximately 2 weeks ago he was using a leaf blower in his yard and started to have balance difficulties stating "I was staggering like a drunk".  He had a difficult time walking around his house stating "my legs did not want to work".  He denies any pain associated or any other neurological symptom.  After resting for some time, his symptoms resolved.  A few days later he attempted to go out and rake his leaves thinking possibly had something to do with the leaf blower but he again experienced the same symptoms.  He has had these symptoms in the past and has been experiencing balance difficulties and lower extremity weakness since his prior stroke approximately 1 year ago but states recently this has been worsening.  He states he is typically active and has been having a difficult time doing so recently.  He denies any shortness of breath with activity, chest pain or palpitations.  He also states worsening balance when he is in a dark room and will experience a vertigo sensation.  He was seen by his PCP for this concern and obtain lab work including CMP, CBC, TSH, lipid panel and testosterone level with all normal levels except for elevated testosterone level and recommended follow-up with his urologist.  She advised him to increase his fluid intake along with ensuring adequate protein intake and to undergo physical therapy.  He states slight increase in his fluid intake drinking approximately 3 carbonated water beverages a day.  He has continued on Plavix without side effects of bleeding or bruising.  Loop recorder has not shown atrial fibrillation thus far.  Blood pressure today 123/71.  Virtual visit 02/05/2019 Dr. Leonie Man: Paul Drake is seen today for virtual video follow-up visit following last office visit on 09/04/2018.  He continues to have left-sided paresthesias which are bothersome as it is his dominant hand.  He is currently on gabapentin 203 times daily which seems  to help but it does make him sleepy and tired shortly after the dose and hence is reluctant to increase the dose further.  He is also on Cymbalta 60 mg daily which has also helped him.  Patient was referred by his primary physician to Dr. Metta Clines for a second opinion .he had an MRI scan of the brain which was done on 09/19/2018 which I have personally reviewed showed no new stroke but old large right MCA infarct with encephalomalacia and gliosis with mild chronic microvascular ischemic changes.  There were no acute findings.  He had vitamin B12 levels checked which were normal.  He denies any recurrent stroke or TIA symptoms.  He is tolerating Plavix well without bleeding or bruising.  He remains on Zocor 20 mg daily which is tolerating well and last lipid profile checked 6 months ago was satisfactory.  His hemoglobin A1c was also 5.6.  His blood pressure is well controlled he remains on Cozaar.  He has a loop recorder ends so for paroxysmal A. fib  has not yet been found.  He finishes end-of-life of the loop recorder 3-years in January 2021.  He remains on a healthy diet and his wife does most of the cooking at home.  He has not gained any weight.  Update 08/10/2019: Paul Drake is a 73 year old male who is being seen today for 30-month follow-up. Continues to experience left left hand paresthesias.  Recommended changing gabapentin IR to ER due to increased fatigue after dosage. Unfortunately, ER formulation denied by insurance coverage.  He continues on gabapentin 200 mg twice daily and 3 mg nightly as well as Cymbalta 60 mg daily.  Symptoms consist of tightness and numbness/tingling with pain and worsens with activity.  Present subjectively whole hand but greater middle finger, ring finger and pinky finger and does endorse neck pain but believes this could be contributed to increased stress.  He will have difficulty at times with sensation such as being able to identify an object with just his hands.  He is  left-handed.  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  Symptoms have been being treated for multiple years but have been slowly worsening.  He also endorses pain and decreased mobility of left wrist.  He denies new stroke/TIA symptoms. Continues on Plavix and Zocor for secondary stroke prevention without side effects. Blood pressure today 126/73. Loop recorder is not shown atrial fibrillation thus far. Recent A1c satisfactory at 5.5. Recent lipid panel showed satisfactory LDL 62. It was recommended to follow-up on carotid ultrasound after prior visit but has not been completed at this time. No further concerns at this time.       REVIEW OF SYSTEMS: Full 14 system review of systems performed and notable only for those listed below and in HPI above, all others are negative:  Numbness/tingling   The following represents the patient's updated allergies and side effects list: Allergies  Allergen Reactions   Molds & Smuts Other (See Comments)   Pollen Extract Other (See Comments)    Runny nose,itchy eyes    The neurologically relevant items on the patient's problem list were reviewed on today's visit.  Neurologic Examination  Today's Vitals   08/10/19 1238  BP: 126/73  Pulse: 72  Temp: (!) 97.4 F (36.3 C)  TempSrc: Oral  Weight: 176 lb 3.2 oz (79.9 kg)  Height: 5' 7.5" (1.715 m)   Body mass index is 27.19 kg/m.  General: well developed, well nourished,  pleasant elderly Caucasian male, seated, in no evident distress Head: head normocephalic and atraumatic.   Neck: supple with no carotid or supraclavicular bruits Cardiovascular: regular rate and rhythm, no murmurs Musculoskeletal: no deformity; decreased left wrist ROM Skin:  no rash/petichiae Vascular:  Normal pulses all extremities   Neurologic Exam Mental Status: Awake and fully alert. Oriented to place and time. Recent and remote memory intact. Attention span,  concentration and fund of knowledge appropriate. Mood and affect appropriate.  Cranial Nerves: Fundoscopic exam reveals sharp disc margins. Pupils equal, briskly reactive to light. Extraocular movements full without nystagmus. Visual fields full to confrontation. Hearing intact. Facial sensation intact. Face, tongue, palate moves normally and symmetrically.  Motor: Normal bulk and tone. Normal strength in all tested extremity muscles. Sensory.:  Decreased pinprick sensation left thumb and pointer finger with heightened sensation middle finger and normal sensation ring and pinky finger. Coordination: Rapid alternating movements normal in all extremities. Finger-to-nose and heel-to-shin performed accurately bilaterally. Gait and Station: Arises from chair without difficulty. Stance  is normal. Gait demonstrates normal stride length and balance Reflexes: 1+ and symmetric. Toes downgoing.     Assessment: As you may recall, he is a 73 y.o. Caucasian male with PMH of right MCA CVA in 2008 was admitted on1/4/16 for again right MCA small stroke. Had TIA symptoms with leaning towards right and imbalance gait on 09/14/16. CT negative. Loop recorder placed so far no afib.  Continues to experience left hand paresthesias  Plan:  -Change gabapentin dosage to 600 mg nightly with use of 200 mg twice daily -Continuation of Cymbalta 60 mg daily -Initiate transdermal cream for ongoing neuropathic hand pain -EMG/NCV to rule out underlying abnormalities such as carpal tunnel syndrome - continue plavix and zocor for stroke prevention -Continue to follow-up with PCP regarding HLD and HTN management and monitoring - Check BP at home and record. -Recommended to obtain carotid ultrasound at prior visit with Dr. Leonie Man but has not been completed at this time.  Will replace order to follow-up regarding history of carotid stenosis - continue monitoring loop recorder -end-of-life battery 09/2018 months - Recommend maintain  blood pressure goal 120-130/80, diabetes with hemoglobin A1c goal below 6.5% and lipids with LDL cholesterol goal below 70 mg/dL.   Recommend follow-up in 6 months or call earlier if needed  I spent more than 25 minutes of face to face time with the patient. Greater than 50% of time was spent in counseling and coordination of care.  Discussion regarding ongoing left hand paresthesias potentially post stroke versus underlying abnormality such as nerve impingement and ongoing evaluation with EMG/NCV and adjustment to current medications with answering all questions to patient satisfaction   Frann Rider, [facility]  [facility] [address] [place], [place] [address]  Phone [phone] Fax [phone] Note: This document was prepared with digital dictation and possible smart phrase technology. Any transcriptional errors that result from this process are unintentional.

## 2019-08-10 NOTE — Patient Instructions (Addendum)
Your Plan:  Trial use of gabapentin 600 mg nightly with minimizing daytime dosages unless needed due to increased fatigue Continuation of Cymbalta 60 mg daily Trial use of transdermal neuropathic cream -compounding specialty pharmacy will be in contact with you You will be scheduled for a EMG/NCV to look for underlying factors potentially causing continued pain which has been slowly worsening Continue Plavix and Zocor for secondary stroke prevention Dr. Leonie Man recommended obtaining carotid ultrasound at prior visit -orders will be adjusted and he will be called to schedule this study   Follow-up in 6 months or call earlier if needed       Thank you for coming to see [place] at [facility]. I hope we have been able to provide you high quality care today.  You may receive a patient satisfaction survey over the next few weeks. We would appreciate your feedback and comments so that we may continue to improve ourselves and the health of our patients.

## 2019-08-11 NOTE — Telephone Encounter (Signed)
[employer] form has been signed by Frann Rider, NP and has been faxed back to [employer]. Confirmation has been received.

## 2019-08-11 NOTE — Telephone Encounter (Signed)
I called [facility] and I relayed that this is a compounding cream thru [facility] [facility].  They can disregard.

## 2019-08-12 ENCOUNTER — Other Ambulatory Visit: Payer: Self-pay

## 2019-08-16 LAB — CUP PACEART REMOTE DEVICE CHECK
Date Time Interrogation Session: 20201126084033
Implantable Pulse Generator Implant Date: 20180130

## 2019-08-17 ENCOUNTER — Ambulatory Visit (INDEPENDENT_AMBULATORY_CARE_PROVIDER_SITE_OTHER): Payer: Medicare Other | Admitting: *Deleted

## 2019-08-17 DIAGNOSIS — I63411 Cerebral infarction due to embolism of right middle cerebral artery: Secondary | ICD-10-CM

## 2019-08-17 NOTE — Addendum Note (Signed)
Addended by: Mal Misty on: 08/17/2019 09:24 AM   Modules accepted: Orders

## 2019-08-21 ENCOUNTER — Ambulatory Visit (HOSPITAL_COMMUNITY)
Admission: RE | Admit: 2019-08-21 | Discharge: 2019-08-21 | Disposition: A | Discharge status: Home / Self Care | Payer: Medicare Other | Source: Ambulatory Visit | Attending: Adult Health | Admitting: Adult Health

## 2019-08-21 ENCOUNTER — Other Ambulatory Visit: Payer: Self-pay

## 2019-08-21 DIAGNOSIS — I6529 Occlusion and stenosis of unspecified carotid artery: Secondary | ICD-10-CM | POA: Diagnosis not present

## 2019-08-25 ENCOUNTER — Telehealth: Payer: Self-pay | Admitting: *Deleted

## 2019-08-25 NOTE — Telephone Encounter (Signed)
-----   Message from Frann Rider, NP sent at 08/24/2019  8:44 AM EST ----- Please advise patient that his recent carotid ultrasound did not show significant stenosis in his carotid arteries

## 2019-08-25 NOTE — Telephone Encounter (Signed)
Sent mychart message

## 2019-08-26 ENCOUNTER — Telehealth: Payer: Self-pay

## 2019-08-26 NOTE — Telephone Encounter (Signed)
Medication change request  Copied from [place] [id number]. Topic: General - Inquiry >> Aug 26, 2019  1:55 PM Mathis Bud wrote: Reason for CRM: patient is requesting a medication change for medication DULoxetine (CYMBALTA) 60 MG capsule  Patient states it is not doing any good.  Patient states PCP had him on a different medication for his hand pain. Patient would like to be switched back to that medication or try something new Call back [phone]

## 2019-08-31 ENCOUNTER — Telehealth: Payer: Self-pay

## 2019-08-31 NOTE — Telephone Encounter (Signed)
[employer] Sent a fax stating they substituted the formula from original order.  Informed provider NP Frann Rider.  Np is okay with substitution.

## 2019-09-02 ENCOUNTER — Other Ambulatory Visit: Payer: Self-pay | Admitting: Neurology

## 2019-09-08 NOTE — Telephone Encounter (Signed)
Mailed copy of mychart message to mail to pt.

## 2019-09-09 NOTE — Progress Notes (Signed)
ILR remote 

## 2019-09-16 ENCOUNTER — Other Ambulatory Visit: Payer: Self-pay | Admitting: Family Medicine

## 2019-09-17 ENCOUNTER — Ambulatory Visit (INDEPENDENT_AMBULATORY_CARE_PROVIDER_SITE_OTHER): Payer: Medicare Other | Admitting: *Deleted

## 2019-09-17 ENCOUNTER — Telehealth: Payer: Self-pay

## 2019-09-17 DIAGNOSIS — I63411 Cerebral infarction due to embolism of right middle cerebral artery: Secondary | ICD-10-CM

## 2019-09-17 LAB — CUP PACEART REMOTE DEVICE CHECK
Date Time Interrogation Session: 20201229084706
Date Time Interrogation Session: 20201231100635
Implantable Pulse Generator Implant Date: 20180130
Implantable Pulse Generator Implant Date: 20180130

## 2019-09-17 NOTE — Telephone Encounter (Signed)
Per [employer] the pt monitor sent an alert for A-fib but no details. I asked the pt to send a manual transmission with his home monitor. I told him the nurse will review it and give him a call back. He agreed to send a transmission today.

## 2019-09-17 NOTE — Telephone Encounter (Signed)
Transmission received.

## 2019-09-17 NOTE — Telephone Encounter (Signed)
Transmission received and reviewed that showed false AF. SR with PVCs . Patient notified of results of transmission.

## 2019-09-24 ENCOUNTER — Ambulatory Visit (INDEPENDENT_AMBULATORY_CARE_PROVIDER_SITE_OTHER): Payer: Medicare Other | Admitting: Family Medicine

## 2019-09-24 ENCOUNTER — Other Ambulatory Visit: Payer: Self-pay

## 2019-09-24 DIAGNOSIS — E785 Hyperlipidemia, unspecified: Secondary | ICD-10-CM | POA: Diagnosis not present

## 2019-09-24 DIAGNOSIS — R739 Hyperglycemia, unspecified: Secondary | ICD-10-CM

## 2019-09-24 DIAGNOSIS — I10 Essential (primary) hypertension: Secondary | ICD-10-CM | POA: Diagnosis not present

## 2019-09-24 DIAGNOSIS — R251 Tremor, unspecified: Secondary | ICD-10-CM | POA: Diagnosis not present

## 2019-09-28 ENCOUNTER — Telehealth: Payer: Self-pay

## 2019-09-28 ENCOUNTER — Other Ambulatory Visit (INDEPENDENT_AMBULATORY_CARE_PROVIDER_SITE_OTHER): Payer: Medicare Other

## 2019-09-28 ENCOUNTER — Other Ambulatory Visit: Payer: Self-pay

## 2019-09-28 DIAGNOSIS — I10 Essential (primary) hypertension: Secondary | ICD-10-CM

## 2019-09-28 DIAGNOSIS — R251 Tremor, unspecified: Secondary | ICD-10-CM | POA: Insufficient documentation

## 2019-09-28 DIAGNOSIS — E785 Hyperlipidemia, unspecified: Secondary | ICD-10-CM | POA: Diagnosis not present

## 2019-09-28 DIAGNOSIS — R739 Hyperglycemia, unspecified: Secondary | ICD-10-CM | POA: Diagnosis not present

## 2019-09-28 LAB — COMPREHENSIVE METABOLIC PANEL
ALT: 12 U/L (ref 0–53)
AST: 18 U/L (ref 0–37)
Albumin: 4.3 g/dL (ref 3.5–5.2)
Alkaline Phosphatase: 59 U/L (ref 39–117)
BUN: 23 mg/dL (ref 6–23)
CO2: 30 mEq/L (ref 19–32)
Calcium: 9.6 mg/dL (ref 8.4–10.5)
Chloride: 103 mEq/L (ref 96–112)
Creatinine, Ser: 1.08 mg/dL (ref 0.40–1.50)
GFR: 66.6 mL/min (ref >60.00)
Glucose, Bld: 93 mg/dL (ref 70–99)
Potassium: 4.2 mEq/L (ref 3.5–5.1)
Sodium: 139 mEq/L (ref 135–145)
Total Bilirubin: 0.5 mg/dL (ref 0.2–1.2)
Total Protein: 6.7 g/dL (ref 6.0–8.3)

## 2019-09-28 LAB — CBC
HCT: 40.4 % (ref 39.0–52.0)
Hemoglobin: 13.5 g/dL (ref 13.0–17.0)
MCHC: 33.4 g/dL (ref 30.0–36.0)
MCV: 98.1 fl (ref 78.0–100.0)
Platelets: 169 10*3/uL (ref 150.0–400.0)
RBC: 4.12 Mil/uL — ABNORMAL LOW (ref 4.22–5.81)
RDW: 12.7 % (ref 11.5–15.5)
WBC: 5.9 10*3/uL (ref 4.0–10.5)

## 2019-09-28 LAB — LIPID PANEL
Cholesterol: 166 mg/dL (ref 0–200)
HDL: 72.9 mg/dL (ref >39.00)
LDL Cholesterol: 57 mg/dL (ref 0–99)
NonHDL: 92.67
Total CHOL/HDL Ratio: 2
Triglycerides: 177 mg/dL — ABNORMAL HIGH (ref 0.0–149.0)
VLDL: 35.4 mg/dL (ref 0.0–40.0)

## 2019-09-28 LAB — HEMOGLOBIN A1C: Hgb A1c MFr Bld: 5.5 % (ref 4.6–6.5)

## 2019-09-28 LAB — TSH: TSH: 1.39 u[IU]/mL (ref 0.35–4.50)

## 2019-09-28 NOTE — Assessment & Plan Note (Signed)
Noting an increasing tremor in right hand over the past couple of weeks. It is not bad enough to him to accept a referral yet but if it worsens he will let us know

## 2019-09-28 NOTE — Assessment & Plan Note (Signed)
Encouraged heart healthy diet, increase exercise, avoid trans fats, consider a krill oil cap daily. Triglycerides up mildly. Minimize simple carbohydrates

## 2019-09-28 NOTE — Telephone Encounter (Signed)
Patient states that he was already scheduled in [facility] and doesn't need appointment at this time

## 2019-09-28 NOTE — Progress Notes (Signed)
Virtual Visit via Video Note  I connected with Paul Drake on 09/24/19 at  3:00 PM EST by a video enabled telemedicine application and verified that I am speaking with the correct person using two identifiers.  Location: Patient: home Provider: home   I discussed the limitations of evaluation and management by telemedicine and the availability of in person appointments. The patient expressed understanding and agreed to proceed. Magdalene Molly, CMA was able to get her set up on a visit, video   Subjective:    Patient ID: Paul Drake, male    DOB: [birth year], 74 y.o.   MRN: [id number]  No chief complaint on file.   HPI Patient is in today for follow up on chronic medical concerns such as hypertension and evaluation of a tremor he has noted in his right hand over the past couple of weeks. No change in gait, falls or trouble with feet. No polyuria or polydipsia. Denies CP/palp/SOB/HA/congestion/fevers/GI or GU c/o. Taking meds as prescribed  Past Medical History:  Diagnosis Date  . Allergic state 12/29/2013  . Arthritis   . Barrett's esophagus   . BPH (benign prostatic hyperplasia)   . Chicken pox   . CVA (cerebral infarction)   . Depression   . GERD (gastroesophageal reflux disease)   . History of kidney stones   . HTN (hypertension), benign 04/01/2015  . Hyperglycemia 01/06/2015  . Hyperlipemia   . Hypertension   . IBS (irritable bowel syndrome)   . Left hand pain 07/21/2017  . Measles as a child  . Medicare annual wellness visit, subsequent 06/26/2015  . Mumps as a child  . Otitis externa 12/10/2013  . Seasonal allergies    some asthma  . Stroke ([place])   . Unspecified asthma(493.90) 12/29/2013    Past Surgical History:  Procedure Laterality Date  . COLONOSCOPY    . EP IMPLANTABLE DEVICE N/A 10/16/2016   Procedure: Loop Recorder Insertion;  Surgeon: Thompson Grayer, MD;  Location: [facility];  Service: Cardiovascular;  Laterality: N/A;  . LITHOTRIPSY     multiple  times  . PROSTATE SURGERY  08/2017  . TEE WITHOUT CARDIOVERSION N/A 09/22/2014   Procedure: TRANSESOPHAGEAL ECHOCARDIOGRAM (TEE);  Surgeon: Josue Hector, MD;  Location: [facility] ENDOSCOPY;  Service: Cardiovascular;  Laterality: N/A;  . WISDOM TOOTH EXTRACTION      Family History  Problem Relation Age of Onset  . Hypertension Mother   . Hyperlipidemia Mother   . Fibromyalgia Mother   . Arthritis Mother        rheumatoid  . Diabetes Sister        type 2  . Hyperlipidemia Brother   . Hypertension Brother   . Ulcers Father 36       Bleeding Ulcers  . Kidney Stones Daughter   . Asthma Daughter   . Healthy Son   . Cancer Maternal Grandfather        skin ?  . Stroke Maternal Aunt   . Cancer Maternal Uncle        prostate    Social History   Socioeconomic History  . Marital status: Married    Spouse name: Webb Silversmith  . Number of children: 3  . Years of education: college  . Highest education level: Master's degree (e.g., MA, MS, MEng, MEd, MSW, MBA)  Occupational History  . Occupation: retired    Comment: retired  Tobacco Use  . Smoking status: Never Smoker  . Smokeless tobacco: Never Used  Substance and Sexual Activity  .  Alcohol use: Yes    Alcohol/week: 1.0 standard drinks    Types: 1 Shots of liquor per week    Comment: 2 old fashions per evening  . Drug use: No  . 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 Not on file  Social History Narrative   Patient consumes 3 cups of caffeine daily   Left handed   Lives at home with his wife. They are living in their daughter's house to help to help take care of their granddaughter.      Patient uses a home gym 3-4 x a week.   Social Determinants of Health   Financial Resource Strain:   . Difficulty of Paying Living Expenses: Not on file  Food Insecurity:   . Worried About Charity fundraiser in the Last Year: Not on file  . Ran Out of Food in the  Last Year: Not on file  Transportation Needs:   . Lack of Transportation (Medical): Not on file  . Lack of Transportation (Non-Medical): Not on file  Physical Activity:   . Days of Exercise per Week: Not on file  . Minutes of Exercise per Session: Not on file  Stress:   . Feeling of Stress : Not on file  Social Connections:   . Frequency of Communication with Friends and Family: Not on file  . Frequency of Social Gatherings with Friends and Family: Not on file  . Attends Religious Services: Not on file  . Active Member of Clubs or Organizations: Not on file  . Attends Archivist Meetings: Not on file  . Marital Status: Not on file  Intimate Partner Violence:   . Fear of Current or Ex-Partner: Not on file  . Emotionally Abused: Not on file  . Physically Abused: Not on file  . Sexually Abused: Not on file    Outpatient Medications Prior to Visit  Medication Sig Dispense Refill  . albuterol (PROVENTIL HFA;VENTOLIN HFA) 108 (90 Base) MCG/ACT inhaler Inhale 2 puffs into the lungs every 6 (six) hours as needed for wheezing or shortness of breath. 1 Inhaler 3  . alfuzosin (UROXATRAL) 10 MG 24 hr tablet Take 10 mg by mouth daily with breakfast.    . ALPRAZolam (XANAX) 0.25 MG tablet Take 1 tablet (0.25 mg total) by mouth 2 (two) times daily as needed for anxiety. 20 tablet 0  . cetirizine (ZYRTEC) 10 MG tablet Take 10 mg by mouth daily.    . clopidogrel (PLAVIX) 75 MG tablet TAKE 1 TABLET(75 MG) BY MOUTH DAILY (Patient taking differently: Take 75 mg by mouth daily. ) 90 tablet 3  . DULoxetine (CYMBALTA) 60 MG capsule Take 1 capsule (60 mg total) by mouth daily. 90 capsule 3  . fluticasone (FLONASE) 50 MCG/ACT nasal spray Place 2 sprays into both nostrils daily. (Patient taking differently: Place 2 sprays into both nostrils daily as needed for allergies. ) 16 g 6  . gabapentin (NEURONTIN) 100 MG capsule Take 2 capsules (200 mg total) by mouth 3 (three) times daily. 270 capsule 1  .  losartan (COZAAR) 100 MG tablet TAKE 1 TABLET(100 MG) BY MOUTH DAILY AS DIRECTED 90 tablet 1  . Menthol, Topical Analgesic, (BIOFREEZE EX) Apply 1 application topically daily as needed (hand pain).    . Multiple Vitamins-Minerals (MULTIVITAMINS THER. W/MINERALS) TABS tablet Take 1 tablet by mouth daily.    . NONFORMULARY OR COMPOUNDED ITEM Neuropathic cream -  apply 1 to 2 g to left hand 3-4 times daily as needed 1 each 3  . Omega-3 Fatty Acids (OMEGA-3 PO) Take 350 mg by mouth daily.     . pantoprazole (PROTONIX) 40 MG tablet TAKE 1 TABLET(40 MG) BY MOUTH DAILY 90 tablet 1  . simvastatin (ZOCOR) 20 MG tablet TAKE 1 TABLET BY MOUTH DAILY 90 tablet 1   No facility-administered medications prior to visit.    Allergies  Allergen Reactions  . Molds & Smuts Other (See Comments)  . Pollen Extract Other (See Comments)    Runny nose,itchy eyes    Review of Systems  Constitutional: Negative for chills, fever and malaise/fatigue.  HENT: Negative for congestion and hearing loss.   Eyes: Negative for discharge.  Respiratory: Negative for cough, sputum production and shortness of breath.   Cardiovascular: Negative for chest pain, palpitations and leg swelling.  Gastrointestinal: Negative for abdominal pain, blood in stool, constipation, diarrhea, heartburn, nausea and vomiting.  Genitourinary: Negative for dysuria, frequency, hematuria and urgency.  Musculoskeletal: Negative for back pain, falls and myalgias.  Skin: Negative for rash.  Neurological: Positive for tremors. Negative for dizziness, tingling, sensory change, loss of consciousness, weakness and headaches.  Endo/Heme/Allergies: Negative for environmental allergies. Does not bruise/bleed easily.  Psychiatric/Behavioral: Negative for depression and suicidal ideas. The patient is nervous/anxious. The patient does not have insomnia.        Objective:    Physical Exam  There were no vitals taken for this visit. Wt Readings from Last 3  Encounters:  08/10/19 176 lb 3.2 oz (79.9 kg)  10/08/18 164 lb (74.4 kg)  09/23/18 169 lb 9.6 oz (76.9 kg)    Diabetic Foot Exam - Simple   No data filed     Lab Results  Component Value Date   WBC 5.9 09/28/2019   HGB 13.5 09/28/2019   HCT 40.4 09/28/2019   PLT 169.0 09/28/2019   GLUCOSE 93 09/28/2019   CHOL 166 09/28/2019   TRIG 177.0 (H) 09/28/2019   HDL 72.90 09/28/2019   LDLDIRECT 76.0 12/31/2014   LDLCALC 57 09/28/2019   ALT 12 09/28/2019   AST 18 09/28/2019   NA 139 09/28/2019   K 4.2 09/28/2019   CL 103 09/28/2019   CREATININE 1.08 09/28/2019   BUN 23 09/28/2019   CO2 30 09/28/2019   TSH 1.39 09/28/2019   INR 0.98 09/20/2014   HGBA1C 5.5 09/28/2019    Lab Results  Component Value Date   TSH 1.39 09/28/2019   Lab Results  Component Value Date   WBC 5.9 09/28/2019   HGB 13.5 09/28/2019   HCT 40.4 09/28/2019   MCV 98.1 09/28/2019   PLT 169.0 09/28/2019   Lab Results  Component Value Date   NA 139 09/28/2019   K 4.2 09/28/2019   CO2 30 09/28/2019   GLUCOSE 93 09/28/2019   BUN 23 09/28/2019   CREATININE 1.08 09/28/2019   BILITOT 0.5 09/28/2019   ALKPHOS 59 09/28/2019   AST 18 09/28/2019   ALT 12 09/28/2019   PROT 6.7 09/28/2019   ALBUMIN 4.3 09/28/2019   CALCIUM 9.6 09/28/2019   ANIONGAP 7 09/14/2016   GFR 66.60 09/28/2019   Lab Results  Component Value Date   CHOL 166 09/28/2019   Lab Results  Component Value Date   HDL 72.90 09/28/2019   Lab Results  Component Value Date   LDLCALC 57 09/28/2019   Lab Results  Component Value Date   TRIG 177.0 (H) 09/28/2019   Lab  Results  Component Value Date   CHOLHDL 2 09/28/2019   Lab Results  Component Value Date   HGBA1C 5.5 09/28/2019       Assessment & Plan:   Problem List Items Addressed This Visit    Essential hypertension - Primary   Relevant Orders   CBC (Completed)   CMP (Completed)   TSH (Completed)   Hyperglycemia    hgba1c acceptable, minimize simple carbs.  Increase exercise as tolerated.       Relevant Orders   A1C (Completed)   HLD (hyperlipidemia)    Encouraged heart healthy diet, increase exercise, avoid trans fats, consider a krill oil cap daily. Triglycerides up mildly. Minimize simple carbohydrates      Relevant Orders   Lipid panel (Completed)   Tremor    Noting an increasing tremor in right hand over the past couple of weeks. It is not bad enough to him to accept a referral yet but if it worsens he will let us know         I am having Paul Drake maintain his multivitamins ther. w/minerals, Omega-3 Fatty Acids (OMEGA-3 PO), albuterol, fluticasone, DULoxetine, clopidogrel, alfuzosin, cetirizine, (Menthol, Topical Analgesic, (BIOFREEZE EX)), simvastatin, ALPRAZolam, NONFORMULARY OR COMPOUNDED ITEM, gabapentin, losartan, and pantoprazole.  No orders of the defined types were placed in this encounter.    I discussed the assessment and treatment plan with the patient. The patient was provided an opportunity to ask questions and all were answered. The patient agreed with the plan and demonstrated an understanding of the instructions.   The patient was advised to call back or seek an in-person evaluation if the symptoms worsen or if the condition fails to improve as anticipated.  I provided 25 minutes of non-face-to-face time during this encounter.   Penni Homans, MD

## 2019-09-28 NOTE — Assessment & Plan Note (Signed)
No recent exacerbation, no changes 

## 2019-09-28 NOTE — Assessment & Plan Note (Signed)
hgba1c acceptable, minimize simple carbs. Increase exercise as tolerated.  

## 2019-09-29 ENCOUNTER — Telehealth: Payer: Self-pay | Admitting: Adult Health

## 2019-09-29 NOTE — Telephone Encounter (Signed)
Noted! Thank you

## 2019-09-29 NOTE — Telephone Encounter (Signed)
Patient called in and stated he has now developed a tremor in his right hand , he wanted to inform before his NCV on the 14th

## 2019-10-01 ENCOUNTER — Ambulatory Visit (INDEPENDENT_AMBULATORY_CARE_PROVIDER_SITE_OTHER): Payer: Medicare Other | Admitting: Neurology

## 2019-10-01 ENCOUNTER — Other Ambulatory Visit: Payer: Self-pay

## 2019-10-01 DIAGNOSIS — R202 Paresthesia of skin: Secondary | ICD-10-CM | POA: Diagnosis not present

## 2019-10-01 DIAGNOSIS — Z0289 Encounter for other administrative examinations: Secondary | ICD-10-CM

## 2019-10-01 NOTE — Progress Notes (Signed)
       Full Name: Paul Drake Gender: Male MRN #: [id number] Date of Birth: [birth year]    Visit Date: 10/01/2019 10:20 Age: 74 Years Examining Physician: Sarina Ill, MD  Referring Physician: Frann Rider, NP  History: Hx of right MCA stroke with residual left-sided weakness and sensory changes.He is being evaluated for left-handed paresthesias.  Summary: EMG/NCS performed on the left upper extremity. All nerves and muscles (as indicated in the following tables) were within normal limits.   Conclusion: This is a normal study. No electrophysiologic evidence for mononeuropathy, polyneuropathy or radiculopathy of the left upper extremity.  Sarina Ill M.D.  [facility] [place], [place] [address] Tel: [phone] Fax: [phone]         [facility]    Nerve / Sites Muscle Latency Ref. Amplitude Ref. Rel Amp Segments Distance Velocity Ref. Area    ms ms mV mV %  cm m/s m/s mVms  L Median - APB     Wrist APB 3.6 ?4.4 5.5 ?4.0 100 Wrist - APB 7   22.9     Upper arm APB 8.1  5.4  98.2 Upper arm - Wrist 24 53 ?49 22.3  L Ulnar - ADM     Wrist ADM 2.6 ?3.3 9.2 ?6.0 100 Wrist - ADM 7   30.0     B.Elbow ADM 6.6  8.3  89.6 B.Elbow - Wrist 22 55 ?49 28.9     A.Elbow ADM 8.4  7.8  94.2 A.Elbow - B.Elbow 10 56 ?49 29.4         A.Elbow - Wrist             SNC    Nerve / Sites Rec. Site Peak Lat Ref.  Amp Ref. Segments Distance Peak Diff Ref.    ms ms V V  cm ms ms  L Median, Ulnar - Transcarpal comparison     Median Palm Wrist 2.1 ?2.2 95 ?35 Median Palm - Wrist 8       Ulnar Palm Wrist 2.1 ?2.2 33 ?12 Ulnar Palm - Wrist 8          Median Palm - Ulnar Palm  0.0 ?0.4  L Median - Orthodromic (Dig II, Mid palm)     Dig II Wrist 3.3 ?3.4 15 ?10 Dig II - Wrist 13    L Ulnar - Orthodromic, (Dig V, Mid palm)     Dig V Wrist 3.1 ?3.1 6 ?5 Dig V - Wrist 83             F  Wave    Nerve F Lat Ref.   ms ms  L Ulnar - ADM 28.9 ?32.0       EMG Summary Table    Spontaneous MUAP Recruitment  Muscle IA Fib PSW Fasc Other Amp Dur. Poly Pattern  L. Deltoid Normal None None None _______ Normal Normal Normal Normal  L. Triceps brachii Normal None None None _______ Normal Normal Normal Normal  L. Pronator teres Normal None None None _______ Normal Normal Normal Normal  L. First dorsal interosseous Normal None None None _______ Normal Normal Normal Normal  L. Opponens pollicis Normal None None None _______ Normal Normal Normal Normal  L. Cervical paraspinals (low) Normal None None None _______ Normal Normal Normal Normal

## 2019-10-02 NOTE — Addendum Note (Signed)
Addended by: Magdalene Molly A on: 10/02/2019 02:23 PM   Modules accepted: Orders

## 2019-10-04 NOTE — Procedures (Signed)
       Full Name: Paul Drake Gender: Male MRN #: [id number] Date of Birth: Jan 09, 1946    Visit Date: 10/01/2019 10:20 Age: 74 Years Examining Physician: Sarina Ill, MD  Referring Physician: Frann Rider, NP  History: Hx of right MCA stroke with residual left-sided weakness and sensory changes.He is being evaluated for left-handed paresthesias.  Summary: EMG/NCS performed on the left upper extremity. All nerves and muscles (as indicated in the following tables) were within normal limits.   Conclusion: This is a normal study. No electrophysiologic evidence for mononeuropathy, polyneuropathy or radiculopathy of the left upper extremity.  Sarina Ill M.D.  [facility] [place],  [address] Tel: [phone] Fax: [phone]         [facility]    Nerve / Sites Muscle Latency Ref. Amplitude Ref. Rel Amp Segments Distance Velocity Ref. Area    ms ms mV mV %  cm m/s m/s mVms  L Median - APB     Wrist APB 3.6 ?4.4 5.5 ?4.0 100 Wrist - APB 7   22.9     Upper arm APB 8.1  5.4  98.2 Upper arm - Wrist 24 53 ?49 22.3  L Ulnar - ADM     Wrist ADM 2.6 ?3.3 9.2 ?6.0 100 Wrist - ADM 7   30.0     B.Elbow ADM 6.6  8.3  89.6 B.Elbow - Wrist 22 55 ?49 28.9     A.Elbow ADM 8.4  7.8  94.2 A.Elbow - B.Elbow 10 56 ?49 29.4         A.Elbow - Wrist             SNC    Nerve / Sites Rec. Site Peak Lat Ref.  Amp Ref. Segments Distance Peak Diff Ref.    ms ms V V  cm ms ms  L Median, Ulnar - Transcarpal comparison     Median Palm Wrist 2.1 ?2.2 95 ?35 Median Palm - Wrist 8       Ulnar Palm Wrist 2.1 ?2.2 33 ?12 Ulnar Palm - Wrist 8          Median Palm - Ulnar Palm  0.0 ?0.4  L Median - Orthodromic (Dig II, Mid palm)     Dig II Wrist 3.3 ?3.4 15 ?10 Dig II - Wrist 13    L Ulnar - Orthodromic, (Dig V, Mid palm)     Dig V Wrist 3.1 ?3.1 6 ?5 Dig V - Wrist 60             F  Wave    Nerve F Lat Ref.   ms ms  L Ulnar - ADM 28.9 ?32.0       EMG Summary Table    Spontaneous MUAP Recruitment  Muscle IA Fib PSW Fasc Other Amp Dur. Poly Pattern  L. Deltoid Normal None None None _______ Normal Normal Normal Normal  L. Triceps brachii Normal None None None _______ Normal Normal Normal Normal  L. Pronator teres Normal None None None _______ Normal Normal Normal Normal  L. First dorsal interosseous Normal None None None _______ Normal Normal Normal Normal  L. Opponens pollicis Normal None None None _______ Normal Normal Normal Normal  L. Cervical paraspinals (low) Normal None None None _______ Normal Normal Normal Normal

## 2019-10-04 NOTE — Progress Notes (Signed)
See procedure note.

## 2019-10-07 ENCOUNTER — Telehealth: Payer: Self-pay | Admitting: *Deleted

## 2019-10-07 NOTE — Telephone Encounter (Signed)
I relayed the results of [place].EMG to pt.  He verbalized understanding. He stated that he had R tremor in hand for the last 2 months.  This was after seeing JM/NP.  Dr Charlett Blake did lab work (in [place]) for PD?  Would he need re-referral back to [place] to address?

## 2019-10-07 NOTE — Telephone Encounter (Signed)
-----   Message from Frann Rider, NP sent at 10/05/2019 12:11 PM EST ----- Please advise patient that recent EMG/NCV did not show any underlying abnormalities

## 2019-10-07 NOTE — Telephone Encounter (Signed)
Thank you for this update.  PCP did lab work for PD?  I previously was seeing him for stroke follow-up and not for tremors.  As his provider at [facility] is no longer here (Dr. Erlinda Hong) he will need a referral from PCP to be evaluated by one of the MD's if PCP feels as though this is indicated.  Thank you.

## 2019-10-07 NOTE — Telephone Encounter (Signed)
I called pt and relayed that referral is needed to another MD in practice and to have his pcp to do this for R hand tremor.  He appreciated this.

## 2019-10-08 ENCOUNTER — Ambulatory Visit: Payer: Medicare Other | Admitting: Family Medicine

## 2019-10-13 DIAGNOSIS — D239 Other benign neoplasm of skin, unspecified: Secondary | ICD-10-CM | POA: Diagnosis not present

## 2019-10-13 DIAGNOSIS — L570 Actinic keratosis: Secondary | ICD-10-CM | POA: Diagnosis not present

## 2019-10-13 DIAGNOSIS — L814 Other melanin hyperpigmentation: Secondary | ICD-10-CM | POA: Diagnosis not present

## 2019-10-13 DIAGNOSIS — L821 Other seborrheic keratosis: Secondary | ICD-10-CM | POA: Diagnosis not present

## 2019-10-13 DIAGNOSIS — L905 Scar conditions and fibrosis of skin: Secondary | ICD-10-CM | POA: Diagnosis not present

## 2019-10-13 DIAGNOSIS — L820 Inflamed seborrheic keratosis: Secondary | ICD-10-CM | POA: Diagnosis not present

## 2019-10-13 DIAGNOSIS — X32XXXS Exposure to sunlight, sequela: Secondary | ICD-10-CM | POA: Diagnosis not present

## 2019-10-13 DIAGNOSIS — D1801 Hemangioma of skin and subcutaneous tissue: Secondary | ICD-10-CM | POA: Diagnosis not present

## 2019-10-19 ENCOUNTER — Ambulatory Visit (INDEPENDENT_AMBULATORY_CARE_PROVIDER_SITE_OTHER): Payer: Medicare Other | Admitting: *Deleted

## 2019-10-19 DIAGNOSIS — I63411 Cerebral infarction due to embolism of right middle cerebral artery: Secondary | ICD-10-CM

## 2019-10-19 LAB — CUP PACEART REMOTE DEVICE CHECK
Date Time Interrogation Session: 20210201000313
Implantable Pulse Generator Implant Date: 20180130

## 2019-10-19 NOTE — Progress Notes (Signed)
ILR Remote 

## 2019-11-09 ENCOUNTER — Encounter: Payer: Self-pay | Admitting: Neurology

## 2019-11-09 ENCOUNTER — Ambulatory Visit (INDEPENDENT_AMBULATORY_CARE_PROVIDER_SITE_OTHER): Payer: Medicare Other | Admitting: Neurology

## 2019-11-09 ENCOUNTER — Telehealth: Payer: Self-pay | Admitting: Neurology

## 2019-11-09 ENCOUNTER — Other Ambulatory Visit: Payer: Self-pay

## 2019-11-09 VITALS — BP 128/83 | HR 82 | Temp 96.9°F | Wt 178.2 lb

## 2019-11-09 DIAGNOSIS — G20 Parkinson's disease: Secondary | ICD-10-CM

## 2019-11-09 DIAGNOSIS — I63411 Cerebral infarction due to embolism of right middle cerebral artery: Secondary | ICD-10-CM

## 2019-11-09 DIAGNOSIS — I6990 Unspecified sequelae of unspecified cerebrovascular disease: Secondary | ICD-10-CM

## 2019-11-09 DIAGNOSIS — R251 Tremor, unspecified: Secondary | ICD-10-CM

## 2019-11-09 DIAGNOSIS — G20A1 Parkinson's disease without dyskinesia, without mention of fluctuations: Secondary | ICD-10-CM

## 2019-11-09 MED ORDER — CARBIDOPA-LEVODOPA 25-100 MG PO TABS: 0.5000 | ORAL_TABLET | Freq: Three times a day (TID) | ORAL | 2 refills | Status: DC

## 2019-11-09 NOTE — Telephone Encounter (Signed)
Medicare/aarp order sent to GI. No auth they will reach out to the patient to schedule.  

## 2019-11-09 NOTE — Progress Notes (Signed)
[facility] [address] [place]. [place] [address] [id number]       OFFICE CONSULT NOTE  [name] Date of Birth:  Apr 10, 1946 Medical Record Number:  [id number]   Referring MD:  Willette Alma  Reason for Referral:  tremor  HPI: Paul Drake is a 74 year old Caucasian male with past medical history of hypertension, hyperlipidemia, arthritis, multiple strokes and TIAs who is seen today for office consultation visit for new onset right hand tremor.  History is obtained from the patient, review of referral notes and electronic medical records and I personally reviewed available imaging films in PACS.  Patient states that for the last 2 months she has noticed resting tremor in the right hand which is intermittent.  He tremor is mild and he describes it as pill-rolling quality.  He can stop the tremor if he supports his hand.  He rarely notices it in activities like holding a cup or newspaper.  Patient is left-handed so mostly uses his left hand.  He does have some residual left hand weakness and paresthesias from a previous stroke and diminished fine motor skills.  Patient admits to some bradykinesia and trouble wearing his jackets and shirts as well as increased drooling of saliva.  He is also noticing increasing gait difficulty with more shuffling and worsening balance.  He is an only occasional falls no injuries.  He admits to disturbed sleep and talking more often in the sleep as well as having some nightmares.  He has no family history of Parkinson's disease.  Patient has no history of any recent head injury though he admits to a concussion in the 1960s and as a teenager while playing football and took him several days to recover from that.  Recent lab work on 09/28/2019 showed hemoglobin A1c 5.5, TSH 1.39 triglycerides 177 and LDL cholesterol 57 mg percent.  CBC and CMP were normal.  Patient has remote history of right MCA infarct in 2008 as well as had a small infarct  adjacent to the old infarct in January 2016 as well as post resection TIA in December 2017.  He has a loop recorder and so for paroxysmal A. fib has not been found.  He has mild isolated left hand weakness and diminished fine motor skills and paresthesias.  He was followed in the office and had seen my nurse practitioner Janett Billow who ordered EMG nerve conduction study which was done on 10/01/2019 and was normal.  Patient has not tried any medication for his tremors.  ROS:   14 system review of systems is positive for tremors, bradykinesia, dizziness, imbalance, falls, numbness, weakness and all other systems negative  PMH:  Past Medical History:  Diagnosis Date  . Allergic state 12/29/2013  . Arthritis   . Barrett's esophagus   . BPH (benign prostatic hyperplasia)   . Chicken pox   . CVA (cerebral infarction)   . Depression   . GERD (gastroesophageal reflux disease)   . History of kidney stones   . HTN (hypertension), benign 04/01/2015  . Hyperglycemia 01/06/2015  . Hyperlipemia   . Hypertension   . IBS (irritable bowel syndrome)   . Left hand pain 07/21/2017  . Measles as a child  . Medicare annual wellness visit, subsequent 06/26/2015  . Mumps as a child  . Otitis externa 12/10/2013  . Seasonal allergies    some asthma  . Stroke ([place])   . Unspecified asthma(493.90) 12/29/2013    Social History:  Social History   Socioeconomic History  .  Marital status: Married    Spouse name: Webb Silversmith  . Number of children: 3  . Years of education: college  . Highest education level: Master's degree (e.g., MA, MS, MEng, MEd, MSW, MBA)  Occupational History  . Occupation: retired    Comment: retired  Tobacco Use  . Smoking status: Never Smoker  . Smokeless tobacco: Never Used  Substance and Sexual Activity  . Alcohol use: Yes    Alcohol/week: 1.0 standard drinks    Types: 1 Shots of liquor per week    Comment: 2 old fashions per evening  . Drug use: No  . 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 Not on file  Social History Narrative   Patient consumes 3 cups of caffeine daily   Left handed   Lives at home with his wife. They are living in their daughter's house to help to help take care of their granddaughter.      Patient uses a home gym 3-4 x a week.   Social Determinants of Health   Financial Resource Strain:   . Difficulty of Paying Living Expenses: Not on file  Food Insecurity:   . Worried About Charity fundraiser in the Last Year: Not on file  . Ran Out of Food in the Last Year: Not on file  Transportation Needs:   . Lack of Transportation (Medical): Not on file  . Lack of Transportation (Non-Medical): Not on file  Physical Activity:   . Days of Exercise per Week: Not on file  . Minutes of Exercise per Session: Not on file  Stress:   . Feeling of Stress : Not on file  Social Connections:   . Frequency of Communication with Friends and Family: Not on file  . Frequency of Social Gatherings with Friends and Family: Not on file  . Attends Religious Services: Not on file  . Active Member of Clubs or Organizations: Not on file  . Attends Archivist Meetings: Not on file  . Marital Status: Not on file  Intimate Partner Violence:   . Fear of Current or Ex-Partner: Not on file  . Emotionally Abused: Not on file  . Physically Abused: Not on file  . Sexually Abused: Not on file    Medications:   Current Outpatient Medications on File Prior to Visit  Medication Sig Dispense Refill  . albuterol (PROVENTIL HFA;VENTOLIN HFA) 108 (90 Base) MCG/ACT inhaler Inhale 2 puffs into the lungs every 6 (six) hours as needed for wheezing or shortness of breath. 1 Inhaler 3  . alfuzosin (UROXATRAL) 10 MG 24 hr tablet Take 10 mg by mouth daily with breakfast.    . ALPRAZolam (XANAX) 0.25 MG tablet Take 1 tablet (0.25 mg total) by mouth 2 (two) times daily as needed for anxiety. 20 tablet  0  . cetirizine (ZYRTEC) 10 MG tablet Take 10 mg by mouth daily.    . clopidogrel (PLAVIX) 75 MG tablet TAKE 1 TABLET(75 MG) BY MOUTH DAILY (Patient taking differently: Take 75 mg by mouth daily. ) 90 tablet 3  . DULoxetine (CYMBALTA) 60 MG capsule Take 1 capsule (60 mg total) by mouth daily. 90 capsule 3  . fluticasone (FLONASE) 50 MCG/ACT nasal spray Place 2 sprays into both nostrils daily. (Patient taking differently: Place 2 sprays into both nostrils daily as needed for allergies. ) 16 g 6  . gabapentin (NEURONTIN) 100 MG capsule Take 2 capsules (  200 mg total) by mouth 3 (three) times daily. 270 capsule 1  . HYDROcodone-acetaminophen (NORCO/VICODIN) 5-325 MG tablet Take 1 tablet by mouth every 6 (six) hours as needed for moderate pain.    Marland Kitchen losartan (COZAAR) 100 MG tablet TAKE 1 TABLET(100 MG) BY MOUTH DAILY AS DIRECTED 90 tablet 1  . Menthol, Topical Analgesic, (BIOFREEZE EX) Apply 1 application topically daily as needed (hand pain).    . Multiple Vitamins-Minerals (MULTIVITAMINS THER. W/MINERALS) TABS tablet Take 1 tablet by mouth daily.    . NONFORMULARY OR COMPOUNDED ITEM Neuropathic cream - apply 1 to 2 g to left hand 3-4 times daily as needed 1 each 3  . Omega-3 Fatty Acids (OMEGA-3 PO) Take 350 mg by mouth daily.     . pantoprazole (PROTONIX) 40 MG tablet TAKE 1 TABLET(40 MG) BY MOUTH DAILY 90 tablet 1  . simvastatin (ZOCOR) 20 MG tablet TAKE 1 TABLET BY MOUTH DAILY 90 tablet 1   No current facility-administered medications on file prior to visit.    Allergies:   Allergies  Allergen Reactions  . Molds & Smuts Other (See Comments)  . Pollen Extract Other (See Comments)    Runny nose,itchy eyes    Physical Exam General: well developed, well nourished elderly Caucasian male, seated, in no evident distress Head: head normocephalic and atraumatic.   Neck: supple with no carotid or supraclavicular bruits Cardiovascular: regular rate and rhythm, no murmurs Musculoskeletal: no  deformity Skin:  no rash/petichiae Vascular:  Normal pulses all extremities  Neurologic Exam Mental Status: Awake and fully alert. Oriented to place and time. Recent and remote memory intact. Attention span, concentration and fund of knowledge appropriate. Mood and affect appropriate.  Diminished facial expression.  Positive glabellar tap. Cranial Nerves: Fundoscopic exam reveals sharp disc margins. Pupils equal, briskly reactive to light. Extraocular movements full without nystagmus. Visual fields full to confrontation. Hearing intact. Facial sensation intact. Face, tongue, palate moves normally and symmetrically.  Motor: Normal bulk and tone. Normal strength in all tested extremity muscles.Mild weakness of left grip and intrinsic hand muscles.  Diminished fine finger movements on the left.  Orbits right over left upper extremity.  Intermittent resting pill-rolling tremor in the right hand which is more pronounced when he walks and turns.  Fatigability in the right hand with rapid finger tapping.  Mild cogwheel rigidity at both wrists upon activation only. Sensory.: intact to touch , pinprick , position and vibratory sensation.  Coordination: Rapid alternating movements normal in all extremities. Finger-to-nose and heel-to-shin performed accurately bilaterally. Gait and Station: Arises from chair without difficulty. Stance is normal. Gait demonstrates diminished right arm swing.  Poor postural response to threat with retropulsion and would fall if not caught  reflexes: 1+ and asymmetric slightly brisker on the left. Toes downgoing.       ASSESSMENT: 74 year old male with new onset resting right hand tremor and bradykinesia likely due to early Parkinson's disease. Remote history of right MCA infarct in 2008 with mild residual left-sided deficits, recurrent small right MCA stroke in January 2016.  TIA in December 2017.  Vascular risk factors of hypertension, hyper lipidemia and previous  strokes    PLAN: I had a long discussion with the patient regarding his new onset of resting right hand tremor and stiffness likely represents early Parkinson's disease.  I recommend trial of Sinemet 25/100 half a tablet twice daily for 3 days to be increased to 3 times daily and subsequently to 1 tablet 3 times daily as tolerated.  I discussed possible side effects with the patient and advised him to call me.  We will also check MRI scan of the brain to look for any new interval strokes or structural lesions.  He will also stay on Plavix for stroke prevention and maintain aggressive risk factor modification given his prior history of strokes and keep blood pressure goal below 130/90, lipids with LDL cholesterol goal below 70 mg percent and diabetes with hemoglobin A1c goal below 6.5%.  Greater than 50% time during this 50-minute consultation visit was spent on counseling and coordination of care about his tremor and Parkinson's disease discussion about stroke prevention and answering questions he will return for follow-up in the future in 2 months or call earlier if necessary. Antony Contras, MD  [facility] [address] [place], [place] [address]  Phone [phone] Fax [phone] Note: This document was prepared with digital dictation and possible smart phrase technology. Any transcriptional errors that result from this process are unintentional.

## 2019-11-09 NOTE — Patient Instructions (Signed)
I had a long discussion with the patient regarding his new onset of resting right hand tremor and stiffness likely represents early Parkinson's disease.  I recommend trial of Sinemet 25/100 half a tablet twice daily for 3 days to be increased to 3 times daily and subsequently to 1 tablet 3 times daily as tolerated.  I discussed possible side effects with the patient and advised him to call me.  We will also check MRI scan of the brain to look for any new interval strokes or structural lesions.  He will also stay on Plavix for stroke prevention and maintain aggressive risk factor modification given his prior history of strokes and keep blood pressure goal below 130/90, lipids with LDL cholesterol goal below 70 mg percent and diabetes with hemoglobin A1c goal below 6.5%.  He will return for follow-up in the future in 2 months or call earlier if necessary.  Parkinson's Disease Parkinson's disease causes problems with movements. It is a long-term condition. It gets worse over time (is progressive). It affects each person in different ways. It makes it harder for you to:  Control how your body moves.  Move your body normally. The condition can range from mild to very bad (advanced). What are the causes? This condition results from a loss of brain cells called neurons. These brain cells make a chemical called dopamine, which is needed to control body movement. As the condition gets worse, the brain cells make less dopamine. This makes it hard to move or control your movements. The exact cause of this condition is not known. What increases the risk?  Being male.  Being age 74 or older.  Having family members who had Parkinson's disease.  Having had an injury to the brain.  Being very sad (depressed).  Being around things that are harmful or poisonous. What are the signs or symptoms? Symptoms of this condition can vary. The main symptoms have to do with movement. These include:  A tremor or  shaking while you are resting that you cannot control.  Stiffness in your neck, arms, and legs.  Slowing of movement. This may include: ? Losing expressions of the face. ? Having trouble making small movements that are needed to button your clothing or brush your teeth.  Walking in a way that is not normal. You may walk with short, shuffling steps.  Loss of balance when standing. You may sway, fall backward, or have trouble making turns. Other symptoms include:  Being very sad, worried, or confused.  Seeing or hearing things that are not real.  Losing thinking abilities (dementia).  Trouble speaking or swallowing.  Having a hard time pooping (constipation).  Needing to pee right away, peeing often, or not being able to control when you pee or poop.  Sleep problems. How is this treated? There is no cure. The goal of treatment is to manage your symptoms. Treatment may include:  Medicines.  Therapy to help with talking or movement.  Surgery to reduce shaking and other movements that you cannot control. Follow these instructions at home: Medicines  Take over-the-counter and prescription medicines only as told by your doctor.  Avoid taking pain or sleeping medicines. Eating and drinking  Follow instructions from your doctor about what you cannot eat or drink.  Do not drink alcohol. Activity  Talk with your doctor about if it is safe for you to drive.  Do exercises as told by your doctor. Lifestyle      Put in grab bars and railings  in your home. These help to prevent falls.  Do not use any products that contain nicotine or tobacco, such as cigarettes, e-cigarettes, and chewing tobacco. If you need help quitting, ask your doctor.  Join a support group. General instructions  Talk with your doctor about what you need help with and what your safety needs are.  Keep all follow-up visits as told by your doctor, including any therapy visits to help with talking or  moving. This is important. Contact a doctor if:  Medicines do not help your symptoms.  You feel off-balance.  You fall at home.  You need more help at home.  You have trouble swallowing.  You have a very hard time pooping.  You have a lot of side effects from your medicines.  You feel very sad, worried, or confused. Get help right away if:  You were hurt in a fall.  You see or hear things that are not real.  You cannot swallow without choking.  You have chest pain or trouble breathing.  You do not feel safe at home.  You have thoughts about hurting yourself or others. If you ever feel like you may hurt yourself or others, or have thoughts about taking your own life, get help right away. You can go to your nearest emergency department or call:  Your local emergency services (911 in the [place]  A suicide crisis helpline, such as the [place] at 2033272092. This is open 24 hours a day. Summary  This condition causes problems with movements.  It is a long-term condition. It gets worse over time.  There is no cure. Treatment focuses on managing your symptoms.  Talk with your doctor about what you need help with and what your safety needs are.  Keep all follow-up visits as told by your doctor. This is important. This information is not intended to replace advice given to you by your health care provider. Make sure you discuss any questions you have with your health care provider. Document Revised: 11/20/2018 Document Reviewed: 11/20/2018 Elsevier Patient Education  [place]; Levodopa tablets What is this medicine? CARBIDOPA;LEVODOPA (kar bi DOE pa; lee voe DOE pa) is used to treat the symptoms of Parkinson's disease. This medicine may be used for other purposes; ask your health care provider or pharmacist if you have questions. COMMON BRAND NAME(S): Atamet, SINEMET What should I tell my health care provider before I  take this medicine? They need to know if you have any of these conditions:  depression or other mental illness  diabetes  glaucoma  heart disease, including history of a heart attack  history of irregular heartbeat  kidney disease  liver disease  lung or breathing disease, like asthma  narcolepsy  sleep apnea  stomach or intestine problems  an unusual or allergic reaction to levodopa, carbidopa, other medicines, foods, dyes, or preservatives  pregnant or trying to get pregnant  breast-feeding How should I use this medicine? Take this medicine by mouth with a glass of water. Follow the directions on the prescription label. Take your doses at regular intervals. Do not take your medicine more often than directed. Do not stop taking except on the advice of your doctor or health care professional. Talk to your pediatrician regarding the use of this medicine in children. Special care may be needed. Overdosage: If you think you have taken too much of this medicine contact a poison control center or emergency room at once. NOTE: This medicine is  only for you. Do not share this medicine with others. What if I miss a dose? If you miss a dose, take it as soon as you can. If it is almost time for your next dose, take only that dose. Do not take double or extra doses. What may interact with this medicine? Do not take this medicine with any of the following medications:  MAOIs like Marplan, Nardil, and Parnate  reserpine  tetrabenazine This medicine may also interact with the following medications:  alcohol  droperidol  entacapone  iron supplements or multivitamins with iron  isoniazid, INH  linezolid  medicines for depression, anxiety, or psychotic disturbances  medicines for high blood pressure  medicines for sleep  metoclopramide  papaverine  procarbazine  tedizolid  rasagiline  selegiline  tolcapone This list may not describe all possible  interactions. Give your health care provider a list of all the medicines, herbs, non-prescription drugs, or dietary supplements you use. Also tell them if you smoke, drink alcohol, or use illegal drugs. Some items may interact with your medicine. What should I watch for while using this medicine? Visit your health care professional for regular checks on your progress. Tell your health care professional if your symptoms do not start to get better or if they get worse. Do not stop taking except on your health care professional's advice. You may develop a severe reaction. Your health care professional will tell you how much medicine to take. You may experience a wearing of effect prior to the time for your next dose of this medicine. You may also experience an on-off effect where the medicine apparently stops working for anything from a minute to several hours, then suddenly starts working again. Tell your doctor or health care professional if any of these symptoms happen to you. Your dose may need to be changed. A high protein diet can slow or prevent absorption of this medicine. Avoid high protein foods near the time of taking this medicine to help to prevent these problems. Take this medicine at least 30 minutes before eating or one hour after meals. You may want to eat higher protein foods later in the day or in small amounts. Discuss your diet with your doctor or health care professional or nutritionist. You may get drowsy or dizzy. Do not drive, use machinery, or do anything that needs mental alertness until you know how this drug affects you. Do not stand or sit up quickly, especially if you are an older patient. This reduces the risk of dizzy or fainting spells. Alcohol may interfere with the effect of this medicine. Avoid alcoholic drinks. When taking this medicine, you may fall asleep without notice. You may be doing activities like driving a car, talking, or eating. You may not feel drowsy before it  happens. Contact your health care provider right away if this happens to you. There have been reports of increased sexual urges or other strong urges such as gambling while taking this medicine. If you experience any of these while taking this medicine, you should report this to your health care provider as soon as possible. If you are diabetic, this medicine may interfere with the accuracy of some tests for sugar or ketones in the urine (does not interfere with blood tests). Check with your doctor or health care professional before changing the dose of your diabetic medicine. This medicine may discolor the urine or sweat, making it look darker or red in color. This is of no cause for concern. However,  this may stain clothing or fabrics. This medicine may cause a decrease in vitamin B6. You should make sure that you get enough vitamin B6 while you are taking this medicine. Discuss the foods you eat and the vitamins you take with your health care professional. What side effects may I notice from receiving this medicine? Side effects that you should report to your doctor or health care professional as soon as possible:  allergic reactions like skin rash, itching or hives, swelling of the face, lips, or tongue  changes in emotions or moods  falling asleep during normal activities like driving  fast, irregular heartbeat  feeling faint or lightheaded, falls  fever  hallucinations  new or increased gambling urges, sexual urges, uncontrolled spending, binge or compulsive eating, or other urges  stomach pain  trouble passing urine or change in the amount of urine  uncontrollable movements of the arms, face, head, mouth, neck, or upper body Side effects that usually do not require medical attention (report to your doctor or health care professional if they continue or are bothersome):  dizziness  headache  loss of appetite  nausea  trouble sleeping This list may not describe all  possible side effects. Call your doctor for medical advice about side effects. You may report side effects to FDA at 1-800-FDA-1088. Where should I keep my medicine? Keep out of the reach of children. Store at room temperature between 15 and 30 degrees C (59 and 86 degrees F). Protect from light. Throw away any unused medicine after the expiration date. NOTE: This sheet is a summary. It may not cover all possible information. If you have questions about this medicine, talk to your doctor, pharmacist, or health care provider.  2020 Elsevier/Gold Standard (2019-05-11 19:49:59)

## 2019-11-19 ENCOUNTER — Ambulatory Visit (INDEPENDENT_AMBULATORY_CARE_PROVIDER_SITE_OTHER): Payer: Medicare Other | Admitting: *Deleted

## 2019-11-19 DIAGNOSIS — I63411 Cerebral infarction due to embolism of right middle cerebral artery: Secondary | ICD-10-CM

## 2019-11-19 LAB — CUP PACEART REMOTE DEVICE CHECK
Date Time Interrogation Session: 20210303151136
Date Time Interrogation Session: 20210304025137
Implantable Pulse Generator Implant Date: 20180130
Implantable Pulse Generator Implant Date: 20180130

## 2019-11-19 NOTE — Progress Notes (Signed)
ILR Remote 

## 2019-11-26 ENCOUNTER — Ambulatory Visit (INDEPENDENT_AMBULATORY_CARE_PROVIDER_SITE_OTHER): Payer: Medicare Other | Admitting: Family Medicine

## 2019-11-26 ENCOUNTER — Other Ambulatory Visit: Payer: Self-pay

## 2019-11-26 DIAGNOSIS — F418 Other specified anxiety disorders: Secondary | ICD-10-CM

## 2019-11-26 DIAGNOSIS — R739 Hyperglycemia, unspecified: Secondary | ICD-10-CM

## 2019-11-26 DIAGNOSIS — I10 Essential (primary) hypertension: Secondary | ICD-10-CM

## 2019-11-26 DIAGNOSIS — I63411 Cerebral infarction due to embolism of right middle cerebral artery: Secondary | ICD-10-CM

## 2019-11-26 DIAGNOSIS — G20 Parkinson's disease: Secondary | ICD-10-CM

## 2019-11-26 MED ORDER — VENLAFAXINE HCL ER 150 MG PO TB24: 150.0000 mg | ORAL_TABLET | Freq: Every day | ORAL | 3 refills | Status: DC

## 2019-11-26 NOTE — Patient Instructions (Signed)
miralax with Benefiber together once or twice daily   Omron Blood Pressure cuff, upper arm, want BP 100-140/60-90 Pulse oximeter, want oxygen in 90s  Weekly vitals  Take Multivitamin with minerals, selenium Vitamin D 1000-2000 IU daily Probiotic with lactobacillus and bifidophilus Asprin EC 81 mg daily  Melatonin 2-5 mg at bedtime  [email or web address]

## 2019-11-27 ENCOUNTER — Telehealth: Payer: Self-pay | Admitting: Family Medicine

## 2019-11-27 NOTE — Telephone Encounter (Signed)
Patient state that he was turn away from [facility]. Dr.Rebecca Tat, D.O.suggested that he follow back up with PCP . Patient states that he is confused. Please advise .

## 2019-11-29 DIAGNOSIS — G20 Parkinson's disease: Secondary | ICD-10-CM | POA: Insufficient documentation

## 2019-11-29 NOTE — Assessment & Plan Note (Signed)
Monitor and report concerns, no changes to meds. Encouraged heart healthy diet such as the DASH diet and exercise as tolerated.  

## 2019-11-29 NOTE — Assessment & Plan Note (Signed)
hgba1c acceptable, minimize simple carbs. Increase exercise as tolerated.  

## 2019-11-29 NOTE — Progress Notes (Signed)
Virtual Visit via Video Note  I connected with Paul Drake on 3/11/21at  9:00 AM EST by a video enabled telemedicine application and verified that I am speaking with the correct person using two identifiers.  Location: Patient: home Provider: office   I discussed the limitations of evaluation and management by telemedicine and the availability of in person appointments. The patient expressed understanding and agreed to proceed. Marin Roberts, CMA was able to get the patient set up on visit, video   Subjective:    Patient ID: Paul Drake, male    DOB: [birth year], 74 y.o.   MRN: [id number]  Chief Complaint  Patient presents with  . Tremors    New diagnosis of Parkinson's per neurologist. Has questions.    HPI Patient is in today for follow up on chronic medical concerns and to discuss his diagnosis of Parkinson's disease. He is very depressed and anxious about his current state and is not responding to Cymbalta. His tremor persists but has not worsened. He is apprehensive about medications but understands he will need them. No c/o polyuria or polydipsia. Denies CP/palp/SOB/HA/congestion/fevers/GI or GU c/o. Taking meds as prescribed  Past Medical History:  Diagnosis Date  . Allergic state 12/29/2013  . Arthritis   . Barrett's esophagus   . BPH (benign prostatic hyperplasia)   . Chicken pox   . CVA (cerebral infarction)   . Depression   . GERD (gastroesophageal reflux disease)   . History of kidney stones   . HTN (hypertension), benign 04/01/2015  . Hyperglycemia 01/06/2015  . Hyperlipemia   . Hypertension   . IBS (irritable bowel syndrome)   . Left hand pain 07/21/2017  . Measles as a child  . Medicare annual wellness visit, subsequent 06/26/2015  . Mumps as a child  . Otitis externa 12/10/2013  . Seasonal allergies    some asthma  . Stroke ([place])   . Unspecified asthma(493.90) 12/29/2013    Past Surgical History:  Procedure Laterality Date  . COLONOSCOPY    . EP  IMPLANTABLE DEVICE N/A 10/16/2016   Procedure: Loop Recorder Insertion;  Surgeon: Thompson Grayer, MD;  Location: [facility];  Service: Cardiovascular;  Laterality: N/A;  . LITHOTRIPSY     multiple times  . PROSTATE SURGERY  08/2017  . TEE WITHOUT CARDIOVERSION N/A 09/22/2014   Procedure: TRANSESOPHAGEAL ECHOCARDIOGRAM (TEE);  Surgeon: Josue Hector, MD;  Location: [facility];  Service: Cardiovascular;  Laterality: N/A;  . WISDOM TOOTH EXTRACTION      Family History  Problem Relation Age of Onset  . Hypertension Mother   . Hyperlipidemia Mother   . Fibromyalgia Mother   . Arthritis Mother        rheumatoid  . Diabetes Sister        type 2  . Hyperlipidemia Brother   . Hypertension Brother   . Ulcers Father 36       Bleeding Ulcers  . Kidney Stones Daughter   . Asthma Daughter   . Healthy Son   . Cancer Maternal Grandfather        skin ?  . Stroke Maternal Aunt   . Cancer Maternal Uncle        prostate    Social History   Socioeconomic History  . Marital status: Married    Spouse name: Webb Silversmith  . Number of children: 3  . Years of education: college  . Highest education level: Master's degree (e.g., MA, MS, MEng, MEd, MSW, MBA)  Occupational History  .  Occupation: retired    Comment: retired  Tobacco Use  . Smoking status: Never Smoker  . Smokeless tobacco: Never Used  Substance and Sexual Activity  . Alcohol use: Yes    Alcohol/week: 1.0 standard drinks    Types: 1 Shots of liquor per week    Comment: 2 old fashions per evening  . Drug use: No  . 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 Not on file  Social History Narrative   Patient consumes 3 cups of caffeine daily   Left handed   Lives at home with his wife. They are living in their daughter's house to help to help take care of their granddaughter.      Patient uses a home gym 3-4 x a week.   Social Determinants of  Health   Financial Resource Strain:   . Difficulty of Paying Living Expenses:   Food Insecurity:   . Worried About Charity fundraiser in the Last Year:   . Arboriculturist in the Last Year:   Transportation Needs:   . Film/video editor (Medical):   Marland Kitchen Lack of Transportation (Non-Medical):   Physical Activity:   . Days of Exercise per Week:   . Minutes of Exercise per Session:   Stress:   . Feeling of Stress :   Social Connections:   . Frequency of Communication with Friends and Family:   . Frequency of Social Gatherings with Friends and Family:   . Attends Religious Services:   . Active Member of Clubs or Organizations:   . Attends Archivist Meetings:   Marland Kitchen Marital Status:   Intimate Partner Violence:   . Fear of Current or Ex-Partner:   . Emotionally Abused:   Marland Kitchen Physically Abused:   . Sexually Abused:     Outpatient Medications Prior to Visit  Medication Sig Dispense Refill  . albuterol (PROVENTIL HFA;VENTOLIN HFA) 108 (90 Base) MCG/ACT inhaler Inhale 2 puffs into the lungs every 6 (six) hours as needed for wheezing or shortness of breath. 1 Inhaler 3  . alfuzosin (UROXATRAL) 10 MG 24 hr tablet Take 10 mg by mouth daily with breakfast.    . ALPRAZolam (XANAX) 0.25 MG tablet Take 1 tablet (0.25 mg total) by mouth 2 (two) times daily as needed for anxiety. 20 tablet 0  . carbidopa-levodopa (SINEMET IR) 25-100 MG tablet Take 0.5 tablets by mouth 3 (three) times daily. Start 0.5 tablet twice daily x 3 days and then three times daily 60 tablet 2  . cetirizine (ZYRTEC) 10 MG tablet Take 10 mg by mouth daily.    . clopidogrel (PLAVIX) 75 MG tablet TAKE 1 TABLET(75 MG) BY MOUTH DAILY (Patient taking differently: Take 75 mg by mouth daily. ) 90 tablet 3  . fluticasone (FLONASE) 50 MCG/ACT nasal spray Place 2 sprays into both nostrils daily. (Patient taking differently: Place 2 sprays into both nostrils daily as needed for allergies. ) 16 g 6  . gabapentin (NEURONTIN) 100  MG capsule Take 2 capsules (200 mg total) by mouth 3 (three) times daily. 270 capsule 1  . HYDROcodone-acetaminophen (NORCO/VICODIN) 5-325 MG tablet Take 1 tablet by mouth every 6 (six) hours as needed for moderate pain.    Marland Kitchen losartan (COZAAR) 100 MG tablet TAKE 1 TABLET(100 MG) BY MOUTH DAILY AS DIRECTED 90 tablet 1  . Menthol, Topical Analgesic, (BIOFREEZE EX) Apply 1 application topically daily as needed (  hand pain).    . Multiple Vitamins-Minerals (MULTIVITAMINS THER. W/MINERALS) TABS tablet Take 1 tablet by mouth daily.    . NONFORMULARY OR COMPOUNDED ITEM Neuropathic cream - apply 1 to 2 g to left hand 3-4 times daily as needed 1 each 3  . Omega-3 Fatty Acids (OMEGA-3 PO) Take 350 mg by mouth daily.     . pantoprazole (PROTONIX) 40 MG tablet TAKE 1 TABLET(40 MG) BY MOUTH DAILY 90 tablet 1  . simvastatin (ZOCOR) 20 MG tablet TAKE 1 TABLET BY MOUTH DAILY 90 tablet 1  . DULoxetine (CYMBALTA) 60 MG capsule Take 1 capsule (60 mg total) by mouth daily. 90 capsule 3   No facility-administered medications prior to visit.    Allergies  Allergen Reactions  . Molds & Smuts Other (See Comments)  . Pollen Extract Other (See Comments)    Runny nose,itchy eyes    Review of Systems  Constitutional: Positive for malaise/fatigue. Negative for fever.  HENT: Negative for congestion.   Eyes: Negative for blurred vision.  Respiratory: Negative for shortness of breath.   Cardiovascular: Negative for chest pain, palpitations and leg swelling.  Gastrointestinal: Negative for abdominal pain, blood in stool and nausea.  Genitourinary: Negative for dysuria and frequency.  Musculoskeletal: Negative for falls.  Skin: Negative for rash.  Neurological: Positive for tremors. Negative for dizziness, loss of consciousness and headaches.  Endo/Heme/Allergies: Negative for environmental allergies.  Psychiatric/Behavioral: Positive for depression. The patient is not nervous/anxious.        Objective:      Physical Exam Constitutional:      Appearance: Normal appearance. He is not ill-appearing.  HENT:     Head: Normocephalic and atraumatic.     Nose: Nose normal.  Pulmonary:     Effort: Pulmonary effort is normal.  Neurological:     Mental Status: He is alert and oriented to person, place, and time.  Psychiatric:        Behavior: Behavior normal.     There were no vitals taken for this visit. Wt Readings from Last 3 Encounters:  11/09/19 178 lb 3.2 oz (80.8 kg)  08/10/19 176 lb 3.2 oz (79.9 kg)  10/08/18 164 lb (74.4 kg)    Diabetic Foot Exam - Simple   No data filed     Lab Results  Component Value Date   WBC 5.9 09/28/2019   HGB 13.5 09/28/2019   HCT 40.4 09/28/2019   PLT 169.0 09/28/2019   GLUCOSE 93 09/28/2019   CHOL 166 09/28/2019   TRIG 177.0 (H) 09/28/2019   HDL 72.90 09/28/2019   LDLDIRECT 76.0 12/31/2014   LDLCALC 57 09/28/2019   ALT 12 09/28/2019   AST 18 09/28/2019   NA 139 09/28/2019   K 4.2 09/28/2019   CL 103 09/28/2019   CREATININE 1.08 09/28/2019   BUN 23 09/28/2019   CO2 30 09/28/2019   TSH 1.39 09/28/2019   INR 0.98 09/20/2014   HGBA1C 5.5 09/28/2019    Lab Results  Component Value Date   TSH 1.39 09/28/2019   Lab Results  Component Value Date   WBC 5.9 09/28/2019   HGB 13.5 09/28/2019   HCT 40.4 09/28/2019   MCV 98.1 09/28/2019   PLT 169.0 09/28/2019   Lab Results  Component Value Date   NA 139 09/28/2019   K 4.2 09/28/2019   CO2 30 09/28/2019   GLUCOSE 93 09/28/2019   BUN 23 09/28/2019   CREATININE 1.08 09/28/2019   BILITOT 0.5 09/28/2019   ALKPHOS 59  09/28/2019   AST 18 09/28/2019   ALT 12 09/28/2019   PROT 6.7 09/28/2019   ALBUMIN 4.3 09/28/2019   CALCIUM 9.6 09/28/2019   ANIONGAP 7 09/14/2016   GFR 66.60 09/28/2019   Lab Results  Component Value Date   CHOL 166 09/28/2019   Lab Results  Component Value Date   HDL 72.90 09/28/2019   Lab Results  Component Value Date   LDLCALC 57 09/28/2019   Lab  Results  Component Value Date   TRIG 177.0 (H) 09/28/2019   Lab Results  Component Value Date   CHOLHDL 2 09/28/2019   Lab Results  Component Value Date   HGBA1C 5.5 09/28/2019       Assessment & Plan:   Problem List Items Addressed This Visit    Depression with anxiety    Not responding to Cymbalta will try switching to Venlafaxine 150 mg daily      Relevant Medications   Venlafaxine HCl 150 MG TB24   Essential hypertension    Monitor and report concerns, no changes to meds. Encouraged heart healthy diet such as the DASH diet and exercise as tolerated.       Hyperglycemia    hgba1c acceptable, minimize simple carbs. Increase exercise as tolerated.      Parkinson's disease ([place]) - Primary    Patient very shaken up by his diagnosis. He is interested in trying all modalities necessary to minimize medication use. He requests second opinion and agrees to transfer of care to [facility] to access programs. Referral placed. Discussed options and course of treatment for 25 minutes.      Relevant Orders   Ambulatory referral to Neurology      I have discontinued [name] Piloto's DULoxetine. I am also having him start on Venlafaxine HCl. Additionally, I am having him maintain his multivitamins ther. w/minerals, Omega-3 Fatty Acids (OMEGA-3 PO), albuterol, fluticasone, clopidogrel, alfuzosin, cetirizine, (Menthol, Topical Analgesic, (BIOFREEZE EX)), simvastatin, ALPRAZolam, NONFORMULARY OR COMPOUNDED ITEM, gabapentin, losartan, pantoprazole, HYDROcodone-acetaminophen, and carbidopa-levodopa.  Meds ordered this encounter  Medications  . Venlafaxine HCl 150 MG TB24    Sig: Take 1 tablet (150 mg total) by mouth at bedtime.    Dispense:  30 tablet    Refill:  3      I discussed the assessment and treatment plan with the patient. The patient was provided an opportunity to ask questions and all were answered. The patient agreed with the plan and demonstrated an understanding of the  instructions.   The patient was advised to call back or seek an in-person evaluation if the symptoms worsen or if the condition fails to improve as anticipated.  I provided 25 minutes of non-face-to-face time during this encounter.   Penni Homans, MD

## 2019-11-29 NOTE — Assessment & Plan Note (Signed)
Not responding to Cymbalta will try switching to Venlafaxine 150 mg daily

## 2019-11-29 NOTE — Assessment & Plan Note (Addendum)
Patient very shaken up by his diagnosis. He is interested in trying all modalities necessary to minimize medication use. He requests second opinion and agrees to transfer of care to [facility] to access programs. Referral placed. Discussed options and course of treatment for 25 minutes.

## 2019-11-30 ENCOUNTER — Encounter: Payer: Self-pay | Admitting: Neurology

## 2019-11-30 ENCOUNTER — Telehealth: Payer: Self-pay | Admitting: Family Medicine

## 2019-11-30 NOTE — Telephone Encounter (Signed)
Per Dr. Charlett Blake: Please let him know that since he has seen dr Tomi Likens in the past theyare requesting he reestablish with him. He will still have access to all of the Parkinson's services and if his case progresses to the point of needing more specialized interventions then Dr Tat can step in. Thanks Dr B

## 2019-11-30 NOTE — Telephone Encounter (Signed)
PT called in today requesting that Dr. Charlett Blake Please reach our to [facility] to see if pt can see Dr, Tat.. He has seen Dr. Loretta Plume in the past for another issue and according to the paitent once you see a specialist there you have to keep seeing the same provider. He really wants to see who Dr. B referred him to see... He's stated he's getting stressed and and begging for him to see right provider Please advise .

## 2019-11-30 NOTE — Telephone Encounter (Signed)
Called patient left detailed message for patient

## 2019-12-01 ENCOUNTER — Ambulatory Visit
Admission: RE | Admit: 2019-12-01 | Discharge: 2019-12-01 | Disposition: A | Discharge status: Home / Self Care | Payer: Medicare Other | Source: Ambulatory Visit | Attending: Neurology | Admitting: Neurology

## 2019-12-01 ENCOUNTER — Other Ambulatory Visit: Payer: Self-pay

## 2019-12-01 DIAGNOSIS — G20 Parkinson's disease: Secondary | ICD-10-CM

## 2019-12-01 MED ORDER — GADOBENATE DIMEGLUMINE 529 MG/ML IV SOLN
16.0000 mL | Freq: Once | INTRAVENOUS | Status: AC | PRN
  Administered 2019-12-01: 16 mL via INTRAVENOUS

## 2019-12-03 ENCOUNTER — Telehealth: Payer: Self-pay | Admitting: Neurology

## 2019-12-03 NOTE — Telephone Encounter (Signed)
Pt called wanting to know if the MRI results are in. Please advise.

## 2019-12-03 NOTE — Telephone Encounter (Signed)
Sent to DR.Penumalli.

## 2019-12-04 ENCOUNTER — Telehealth: Payer: Self-pay

## 2019-12-04 NOTE — Telephone Encounter (Signed)
I called patient and reviewed MRI results. No change from prior scan. Also answered questions about parkinson's dz. -VRP

## 2019-12-04 NOTE — Telephone Encounter (Signed)
Spoke with Dr Leta Baptist and he said scan was read and was no change from prior. He called pt personally and gave results. Followed up with me and said pt seemed reassured.

## 2019-12-04 NOTE — Telephone Encounter (Signed)
Patient called in to see if Dr. Charlett Blake could send in a prescription appeal for this proscription for Venlafaxine HCl 150 MG TB24 [id number]    Please send it to [id number] - [place], [place] - [address] AT [place]  [address] [place], [place] [address]  Phone:  [phone] Fax:  [phone]  DEA #:  [id number]

## 2019-12-04 NOTE — Telephone Encounter (Signed)
Pt has called and was made aware the request for the MRI results was forwarded to another provider.  Pt asked that it be noted he would like the results as soon  As possible.

## 2019-12-04 NOTE — Telephone Encounter (Signed)
Patient called in upset because he wanted to know what his test results was from his Neurologist Dr. Per the patient the Neurologist Dr. Is out of the country until July 25,2021 Per the patient he states that  He is going to kill him self and he would not be living long enough to see the Dr. In July. The patient wants some one from the [place] office to follow up with him to see if we can help him get his test results either today or by Monday before he kill's him self. Please call the patient back at [phone] thanks,

## 2019-12-07 MED ORDER — VENLAFAXINE HCL ER 150 MG PO CP24: 150.0000 mg | ORAL_CAPSULE | Freq: Every day | ORAL | 3 refills | Status: DC

## 2019-12-07 NOTE — Telephone Encounter (Signed)
Rx sent 

## 2019-12-07 NOTE — Telephone Encounter (Signed)
Spoke with patient and he stated that [facility] needed an appeal because of something with [employer].  Will call pharmacy and get someone to check pharmacy faxes for a better explanation.

## 2019-12-07 NOTE — Telephone Encounter (Signed)
Spoke with pharmacist and med needs prior auth.    Paul Drake can you send for PA please?

## 2019-12-07 NOTE — Telephone Encounter (Signed)
Yes please change to capsules with same sig, strength and number.

## 2019-12-07 NOTE — Telephone Encounter (Signed)
Pt's insurance covers the venlafaxine capsules just not tablets. Okay to change?

## 2019-12-15 ENCOUNTER — Other Ambulatory Visit: Payer: Self-pay | Admitting: Neurology

## 2019-12-15 ENCOUNTER — Other Ambulatory Visit: Payer: Self-pay | Admitting: Family Medicine

## 2019-12-15 ENCOUNTER — Other Ambulatory Visit: Payer: Self-pay

## 2019-12-15 MED ORDER — GABAPENTIN 100 MG PO CAPS: ORAL_CAPSULE | ORAL | 1 refills | Status: DC

## 2019-12-21 ENCOUNTER — Ambulatory Visit (INDEPENDENT_AMBULATORY_CARE_PROVIDER_SITE_OTHER): Payer: Medicare Other | Admitting: *Deleted

## 2019-12-21 DIAGNOSIS — I63411 Cerebral infarction due to embolism of right middle cerebral artery: Secondary | ICD-10-CM | POA: Diagnosis not present

## 2019-12-21 LAB — CUP PACEART REMOTE DEVICE CHECK
Date Time Interrogation Session: 20210404035408
Implantable Pulse Generator Implant Date: 20180130

## 2019-12-22 NOTE — Progress Notes (Signed)
ILR Remote 

## 2019-12-22 NOTE — Telephone Encounter (Signed)
Thanks

## 2019-12-28 ENCOUNTER — Other Ambulatory Visit: Payer: Self-pay

## 2019-12-28 ENCOUNTER — Telehealth: Payer: Self-pay | Admitting: Neurology

## 2019-12-28 MED ORDER — CARBIDOPA-LEVODOPA 25-100 MG PO TABS: 0.5000 | ORAL_TABLET | Freq: Three times a day (TID) | ORAL | 3 refills | Status: DC

## 2019-12-28 MED ORDER — CARBIDOPA-LEVODOPA 25-100 MG PO TABS: 1.0000 | ORAL_TABLET | Freq: Three times a day (TID) | ORAL | 3 refills | Status: DC

## 2019-12-28 NOTE — Telephone Encounter (Signed)
Pt called to verify medication instructions for his carbidopa-levodopa (SINEMET IR) 25-100 MG tablet    states he has been taking 1 pill 3 times a day and due to the prescription instructions he is out of medication and pharmacy is unable to refill.

## 2019-12-28 NOTE — Progress Notes (Signed)
Per Dr Leonie Man increase to 1 tablet three times a day. Pt is tolerating medication. Resent to pharmacy.

## 2019-12-28 NOTE — Telephone Encounter (Signed)
Carbidopa-sinemet sent to pharmacy with 90 pills as the quantity and 3 refills.Marland Kitchen

## 2019-12-28 NOTE — Telephone Encounter (Signed)
I called pt back that medication resent of carbidopa 25/100 IR of 90 tablets and 3 refills for 30 day supply. Pt has increase to 1 tablet three times a day. I stated to call pharmacy to make sure medication is ready for pick up. He verbalized understanding.

## 2020-01-04 NOTE — Telephone Encounter (Signed)
Pt called to inform he is having a bad reaction to the cabidopa states he has been on the verge of blacking out and has had to stand still while holding on to something. Pt would like to know if he should discontinue or what he should do

## 2020-01-04 NOTE — Telephone Encounter (Signed)
If he continues to feel light headed and have low blood pressure once he makes the changes to the Sinemet as recommended by neurology then he can drop his Cozaar to 50 mg daily and then he must monitor his BP daily and send [place] a tally after one to two weeks and/or schedule an appt to discuss next steps.

## 2020-01-04 NOTE — Telephone Encounter (Signed)
I spoke to the patient.  Informed me that he feels dizzy lightheaded and orthostatic when he first stands up.  Sinemet is definitely helping him but in a couple of afternoons he feels dizzy and lightheaded about to 3 hours after his dose.  I recommend he reduce his afternoon dose of Sinemet to half a tablet and keep the morning and evening dose as 1 full tablet.  I advised him to do orthostatic tolerance exercises as well as to get a blood pressure machine and record his blood pressure regularly.  He may need to consider lowering the Cozaar for hypertension which he takes if his blood pressure is on the low side.  I also advised him to discuss this with his primary physician Dr. Hillis Range.  He voiced understanding

## 2020-01-05 NOTE — Telephone Encounter (Signed)
Thanks. Agree with plan 

## 2020-01-06 NOTE — Telephone Encounter (Signed)
Spoke with patient and he states that he is taking his Sinemet 1tab am, 1/108midday, 1tab at night.  He did not take the losartan yesterday and he felt fine.  He has not checked bp.  He will work on getting bp machine.  He has an appt with Dr. Leonie Man tomorrow and he will call back to schedule an appt with [place] after he see him.

## 2020-01-07 ENCOUNTER — Other Ambulatory Visit: Payer: Self-pay

## 2020-01-07 ENCOUNTER — Encounter: Payer: Self-pay | Admitting: Neurology

## 2020-01-07 ENCOUNTER — Ambulatory Visit (INDEPENDENT_AMBULATORY_CARE_PROVIDER_SITE_OTHER): Payer: Medicare Other | Admitting: Neurology

## 2020-01-07 VITALS — BP 109/60 | HR 79 | Temp 97.1°F | Ht 67.0 in | Wt 170.6 lb

## 2020-01-07 DIAGNOSIS — I951 Orthostatic hypotension: Secondary | ICD-10-CM | POA: Diagnosis not present

## 2020-01-07 DIAGNOSIS — G20 Parkinson's disease: Secondary | ICD-10-CM | POA: Diagnosis not present

## 2020-01-07 DIAGNOSIS — I63411 Cerebral infarction due to embolism of right middle cerebral artery: Secondary | ICD-10-CM | POA: Diagnosis not present

## 2020-01-07 NOTE — Progress Notes (Signed)
[facility] [address] [place]. [place] [address] [phone]       OFFICE FOLLOW UP VISIT NOTE  Paul Drake Date of Birth:  04-Sep-1946 Medical Record Number:  [id number]   Referring MD:  Willette Alma  Reason for Referral:  tremor  [id number] consult 11/09/2019: [name] is a 74 year old Caucasian male with past medical history of hypertension, hyperlipidemia, arthritis, multiple strokes and TIAs who is seen today for office consultation visit for new onset right hand tremor.  History is obtained from the patient, review of referral notes and electronic medical records and I personally reviewed available imaging films in PACS.  Patient states that for the last 2 months she has noticed resting tremor in the right hand which is intermittent.  He tremor is mild and he describes it as pill-rolling quality.  He can stop the tremor if he supports his hand.  He rarely notices it in activities like holding a cup or newspaper.  Patient is left-handed so mostly uses his left hand.  He does have some residual left hand weakness and paresthesias from a previous stroke and diminished fine motor skills.  Patient admits to some bradykinesia and trouble wearing his jackets and shirts as well as increased drooling of saliva.  He is also noticing increasing gait difficulty with more shuffling and worsening balance.  He is an only occasional falls no injuries.  He admits to disturbed sleep and talking more often in the sleep as well as having some nightmares.  He has no family history of Parkinson's disease.  Patient has no history of any recent head injury though he admits to a concussion in the 1960s and as a teenager while playing football and took him several days to recover from that.  Recent lab work on 09/28/2019 showed hemoglobin A1c 5.5, TSH 1.39 triglycerides 177 and LDL cholesterol 57 mg percent.  CBC and CMP were normal.  Patient has remote history of right MCA infarct in 2008 as  well as had a small infarct adjacent to the old infarct in January 2016 as well as post resection TIA in December 2017.  He has a loop recorder and so for paroxysmal A. fib has not been found.  He has mild isolated left hand weakness and diminished fine motor skills and paresthesias.  He was followed in the office and had seen my nurse practitioner Janett Billow who ordered EMG nerve conduction study which was done on 10/01/2019 and was normal.  Patient has not tried any medication for his tremors. Update 01/07/2020 : He returns for follow-up after last visit 2 months ago.  Patient's states that the Sinemet has certainly helped his tremor and as well as mobility.  However he called the office few weeks ago complaining of dizziness and orthostasis particularly couple of times in the afternoon few hours after Sinemet dose he felt dizzy lightheaded and to hold up and avoid falling.  I advised him to reduce afternoon dose of Sinemet which did help but he was also taking Cozaar for his hypertension and he found out after stopping it few days ago that his dizziness and orthostatic symptoms have disappeared.  He has not yet restarted it but plans to discuss this with his primary physician Dr. Randel Pigg.  His blood pressure today is 109/69.  He has no new complaints.  He is tolerating Sinemet well without sleepiness dizziness, hallucinations delusions or upset stomach.  He does have nightmares but these are longstanding and occurred even before starting Sinemet.  He denies drooling of saliva, difficulty getting out of a chair or wearing his clothes or problems with gait or balance. ROS:   14 system review of systems is positive for tremors,  dizziness, imbalance, falls, numbness, weakness and all other systems negative  PMH:  Past Medical History:  Diagnosis Date  . Allergic state 12/29/2013  . Arthritis   . Barrett's esophagus   . BPH (benign prostatic hyperplasia)   . Chicken pox   . CVA (cerebral infarction)   .  Depression   . GERD (gastroesophageal reflux disease)   . History of kidney stones   . HTN (hypertension), benign 04/01/2015  . Hyperglycemia 01/06/2015  . Hyperlipemia   . Hypertension   . IBS (irritable bowel syndrome)   . Left hand pain 07/21/2017  . Measles as a child  . Medicare annual wellness visit, subsequent 06/26/2015  . Mumps as a child  . Otitis externa 12/10/2013  . Seasonal allergies    some asthma  . Stroke ([place])   . Unspecified asthma(493.90) 12/29/2013    Social History:  Social History   Socioeconomic History  . Marital status: Married    Spouse name: Webb Silversmith  . Number of children: 3  . Years of education: college  . Highest education level: Master's degree (e.g., MA, MS, MEng, MEd, MSW, MBA)  Occupational History  . Occupation: retired    Comment: retired  Tobacco Use  . Smoking status: Never Smoker  . Smokeless tobacco: Never Used  Substance and Sexual Activity  . Alcohol use: Yes    Alcohol/week: 1.0 standard drinks    Types: 1 Shots of liquor per week    Comment: 2 old fashions per evening  . Drug use: No  . 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 Not on file  Social History Narrative   Patient consumes 3 cups of caffeine daily   Left handed   Lives at home with his wife. They are living in their daughter's house to help to help take care of their granddaughter.      Patient uses a home gym 3-4 x a week.   Social Determinants of Health   Financial Resource Strain:   . Difficulty of Paying Living Expenses:   Food Insecurity:   . Worried About Charity fundraiser in the Last Year:   . Arboriculturist in the Last Year:   Transportation Needs:   . Film/video editor (Medical):   Marland Kitchen Lack of Transportation (Non-Medical):   Physical Activity:   . Days of Exercise per Week:   . Minutes of Exercise per Session:   Stress:   . Feeling of Stress :   Social  Connections:   . Frequency of Communication with Friends and Family:   . Frequency of Social Gatherings with Friends and Family:   . Attends Religious Services:   . Active Member of Clubs or Organizations:   . Attends Archivist Meetings:   Marland Kitchen Marital Status:   Intimate Partner Violence:   . Fear of Current or Ex-Partner:   . Emotionally Abused:   Marland Kitchen Physically Abused:   . Sexually Abused:     Medications:   Current Outpatient Medications on File Prior to Visit  Medication Sig Dispense Refill  . albuterol (PROVENTIL HFA;VENTOLIN HFA) 108 (90 Base) MCG/ACT inhaler Inhale 2 puffs into the lungs every 6 (six) hours as needed for wheezing  or shortness of breath. 1 Inhaler 3  . alfuzosin (UROXATRAL) 10 MG 24 hr tablet Take 10 mg by mouth daily with breakfast.    . ALPRAZolam (XANAX) 0.25 MG tablet Take 1 tablet (0.25 mg total) by mouth 2 (two) times daily as needed for anxiety. 20 tablet 0  . carbidopa-levodopa (SINEMET IR) 25-100 MG tablet Take 1 tablet by mouth 3 (three) times daily. (Patient taking differently: Take 1 tablet by mouth 3 (three) times daily. Take 1 pill in the am, 1/2 in the mid day and 1 pill at qhs) 90 tablet 3  . cetirizine (ZYRTEC) 10 MG tablet Take 10 mg by mouth daily.    . clopidogrel (PLAVIX) 75 MG tablet TAKE 1 TABLET(75 MG) BY MOUTH DAILY (Patient taking differently: Take 75 mg by mouth daily. ) 90 tablet 3  . DULoxetine (CYMBALTA) 60 MG capsule Take 60 mg by mouth daily.    . fluticasone (FLONASE) 50 MCG/ACT nasal spray Place 2 sprays into both nostrils daily. (Patient taking differently: Place 2 sprays into both nostrils daily as needed for allergies. ) 16 g 6  . gabapentin (NEURONTIN) 100 MG capsule TAKE 2 CAPSULES(200 MG) BY MOUTH THREE TIMES DAILY 540 capsule 1  . HYDROcodone-acetaminophen (NORCO/VICODIN) 5-325 MG tablet Take 1 tablet by mouth every 6 (six) hours as needed for moderate pain.    Marland Kitchen losartan (COZAAR) 100 MG tablet TAKE 1 TABLET(100 MG) BY  MOUTH DAILY AS DIRECTED 90 tablet 1  . Menthol, Topical Analgesic, (BIOFREEZE EX) Apply 1 application topically daily as needed (hand pain).    . Multiple Vitamins-Minerals (MULTIVITAMINS THER. W/MINERALS) TABS tablet Take 1 tablet by mouth daily.    . NONFORMULARY OR COMPOUNDED ITEM Neuropathic cream - apply 1 to 2 g to left hand 3-4 times daily as needed 1 each 3  . Omega-3 Fatty Acids (OMEGA-3 PO) Take 350 mg by mouth daily.     . pantoprazole (PROTONIX) 40 MG tablet TAKE 1 TABLET(40 MG) BY MOUTH DAILY 90 tablet 1  . simvastatin (ZOCOR) 20 MG tablet TAKE 1 TABLET BY MOUTH DAILY 90 tablet 1  . venlafaxine XR (EFFEXOR-XR) 150 MG 24 hr capsule Take 1 capsule (150 mg total) by mouth at bedtime. 30 capsule 3   No current facility-administered medications on file prior to visit.    Allergies:   Allergies  Allergen Reactions  . Molds & Smuts Other (See Comments)  . Pollen Extract Other (See Comments)    Runny nose,itchy eyes    Physical Exam General: well developed, well nourished elderly Caucasian male, seated, in no evident distress Head: head normocephalic and atraumatic.   Neck: supple with no carotid or supraclavicular bruits Cardiovascular: regular rate and rhythm, no murmurs Musculoskeletal: no deformity Skin:  no rash/petichiae Vascular:  Normal pulses all extremities  Neurologic Exam Mental Status: Awake and fully alert. Oriented to place and time. Recent and remote memory intact. Attention span, concentration and fund of knowledge appropriate. Mood and affect appropriate.  Diminished facial expression.  Positive glabellar tap. Cranial Nerves: Fundoscopic exam reveals sharp disc margins. Pupils equal, briskly reactive to light. Extraocular movements full without nystagmus. Visual fields full to confrontation. Hearing intact. Facial sensation intact. Face, tongue, palate moves normally and symmetrically.  Motor: Normal bulk and tone. Normal strength in all tested extremity  muscles.Mild weakness of left grip and intrinsic hand muscles.  Diminished fine finger movements on the left.  Orbits right over left upper extremity.  Intermittent resting pill-rolling tremor in the right hand  which is more pronounced when he walks and turns.  Fatigability in the right hand with rapid finger tapping.  Mild cogwheel rigidity at both wrists upon activation only. Sensory.: intact to touch , pinprick , position and vibratory sensation.  Coordination: Rapid alternating movements normal in all extremities. Finger-to-nose and heel-to-shin performed accurately bilaterally. Gait and Station: Arises from chair without difficulty. Stance is normal. Gait demonstrates diminished right arm swing.  Poor postural response to threat with retropulsion and would fall if not caught  reflexes: 1+ and asymmetric slightly brisker on the left. Toes downgoing.       ASSESSMENT: 74 year old male with new onset resting right hand tremor and bradykinesia likely due to early Parkinson's disease which appears to have responded to Sinemet.  He had recent episodes of orthostasis likely related to Cozaar and hypotension. Remote history of right MCA infarct in 2008 with mild residual left-sided deficits, recurrent small right MCA stroke in January 2016.  TIA in December 2017.  Vascular risk factors of hypertension, hyper lipidemia and previous strokes    PLAN: I had a long discussion with the patient with regards to his parkinsonian tremor which seems to have responded well to Sinemet.  Continue Sinemet 25/101 tablet 3 times daily.  I also advised him to do orthostatic tolerance exercises given his symptoms with history of postural hypotension and to stay off Cozaar and to keep daily blood pressure logs and follow-up with his primary care physician Dr. Randel Pigg for management of his hypertension.  Continue Plavix for stroke prevention with strict control of hypertension blood pressure goal below 130/90, lipids with  LDL cholesterol goal below 70 mg percent and diabetes with hemoglobin A1c goal below 6.5%.  He will return for follow-up in the future in 6 months or call earlier if necessary  Greater than 50% time during this 25-minute  visit was spent on counseling and coordination of care about his tremor and Parkinson's disease discussion about stroke prevention and answering questions. Antony Contras, MD  [facility] [address] [place], [place] [address]  Phone [phone] Fax [phone] Note: This document was prepared with digital dictation and possible smart phrase technology. Any transcriptional errors that result from this process are unintentional.

## 2020-01-07 NOTE — Patient Instructions (Addendum)
I had a long discussion with the patient with regards to his parkinsonian tremor which seems to have responded well to Sinemet.  Continue Sinemet 25/101 tablet 3 times daily.  I also advised him to do orthostatic tolerance exercises given his symptoms with history of postural hypotension and to stay off Cozaar and to keep daily blood pressure logs and follow-up with his primary care physician Dr. Randel Pigg for management of his hypertension.  Continue Plavix for stroke prevention with strict control of hypertension blood pressure goal below 130/90, lipids with LDL cholesterol goal below 70 mg percent and diabetes with hemoglobin A1c goal below 6.5%.  He will return for follow-up in the future in 6 months or call earlier if necessary

## 2020-01-21 LAB — CUP PACEART REMOTE DEVICE CHECK
Date Time Interrogation Session: 20210505035817
Implantable Pulse Generator Implant Date: 20180130

## 2020-01-25 ENCOUNTER — Ambulatory Visit (INDEPENDENT_AMBULATORY_CARE_PROVIDER_SITE_OTHER): Payer: Medicare Other | Admitting: *Deleted

## 2020-01-25 DIAGNOSIS — G459 Transient cerebral ischemic attack, unspecified: Secondary | ICD-10-CM

## 2020-01-26 ENCOUNTER — Ambulatory Visit: Payer: Medicare Other | Admitting: Family Medicine

## 2020-01-26 NOTE — Progress Notes (Signed)
Carelink Summary Report / Loop Recorder 

## 2020-02-08 ENCOUNTER — Ambulatory Visit (INDEPENDENT_AMBULATORY_CARE_PROVIDER_SITE_OTHER): Payer: Medicare Other | Admitting: Family Medicine

## 2020-02-08 ENCOUNTER — Other Ambulatory Visit: Payer: Self-pay

## 2020-02-08 ENCOUNTER — Ambulatory Visit: Payer: Medicare Other | Admitting: Adult Health

## 2020-02-08 ENCOUNTER — Encounter: Payer: Self-pay | Admitting: Family Medicine

## 2020-02-08 VITALS — BP 120/78 | HR 81 | Temp 97.0°F | Ht 68.0 in | Wt 172.0 lb

## 2020-02-08 DIAGNOSIS — E785 Hyperlipidemia, unspecified: Secondary | ICD-10-CM | POA: Diagnosis not present

## 2020-02-08 DIAGNOSIS — I63411 Cerebral infarction due to embolism of right middle cerebral artery: Secondary | ICD-10-CM | POA: Diagnosis not present

## 2020-02-08 DIAGNOSIS — G20 Parkinson's disease: Secondary | ICD-10-CM

## 2020-02-08 DIAGNOSIS — G450 Vertebro-basilar artery syndrome: Secondary | ICD-10-CM | POA: Diagnosis not present

## 2020-02-08 DIAGNOSIS — F514 Sleep terrors [night terrors]: Secondary | ICD-10-CM

## 2020-02-08 DIAGNOSIS — J301 Allergic rhinitis due to pollen: Secondary | ICD-10-CM

## 2020-02-08 DIAGNOSIS — M79642 Pain in left hand: Secondary | ICD-10-CM | POA: Diagnosis not present

## 2020-02-08 DIAGNOSIS — H9313 Tinnitus, bilateral: Secondary | ICD-10-CM

## 2020-02-08 DIAGNOSIS — R519 Headache, unspecified: Secondary | ICD-10-CM | POA: Insufficient documentation

## 2020-02-08 DIAGNOSIS — R739 Hyperglycemia, unspecified: Secondary | ICD-10-CM | POA: Diagnosis not present

## 2020-02-08 DIAGNOSIS — I10 Essential (primary) hypertension: Secondary | ICD-10-CM | POA: Diagnosis not present

## 2020-02-08 DIAGNOSIS — K219 Gastro-esophageal reflux disease without esophagitis: Secondary | ICD-10-CM | POA: Diagnosis not present

## 2020-02-08 LAB — COMPREHENSIVE METABOLIC PANEL
ALT: 7 U/L (ref 0–53)
AST: 16 U/L (ref 0–37)
Albumin: 4.1 g/dL (ref 3.5–5.2)
Alkaline Phosphatase: 60 U/L (ref 39–117)
BUN: 16 mg/dL (ref 6–23)
CO2: 31 mEq/L (ref 19–32)
Calcium: 9.1 mg/dL (ref 8.4–10.5)
Chloride: 104 mEq/L (ref 96–112)
Creatinine, Ser: 1.03 mg/dL (ref 0.40–1.50)
GFR: 70.27 mL/min (ref >60.00)
Glucose, Bld: 94 mg/dL (ref 70–99)
Potassium: 4.1 mEq/L (ref 3.5–5.1)
Sodium: 142 mEq/L (ref 135–145)
Total Bilirubin: 0.7 mg/dL (ref 0.2–1.2)
Total Protein: 6.2 g/dL (ref 6.0–8.3)

## 2020-02-08 LAB — CBC
HCT: 37.8 % — ABNORMAL LOW (ref 39.0–52.0)
Hemoglobin: 12.8 g/dL — ABNORMAL LOW (ref 13.0–17.0)
MCHC: 33.8 g/dL (ref 30.0–36.0)
MCV: 98.1 fl (ref 78.0–100.0)
Platelets: 158 10*3/uL (ref 150.0–400.0)
RBC: 3.85 Mil/uL — ABNORMAL LOW (ref 4.22–5.81)
RDW: 12.7 % (ref 11.5–15.5)
WBC: 5 10*3/uL (ref 4.0–10.5)

## 2020-02-08 LAB — LIPID PANEL
Cholesterol: 155 mg/dL (ref 0–200)
HDL: 59.7 mg/dL (ref >39.00)
LDL Cholesterol: 75 mg/dL (ref 0–99)
NonHDL: 95.23
Total CHOL/HDL Ratio: 3
Triglycerides: 102 mg/dL (ref 0.0–149.0)
VLDL: 20.4 mg/dL (ref 0.0–40.0)

## 2020-02-08 LAB — TSH: TSH: 1.69 u[IU]/mL (ref 0.35–4.50)

## 2020-02-08 NOTE — Assessment & Plan Note (Signed)
Encouraged heart healthy diet, increase exercise, avoid trans fats, consider a krill oil cap daily 

## 2020-02-08 NOTE — Assessment & Plan Note (Signed)
Levodopa helps the hand pain some. Gabapentin 1 tab tid (occasionally qid) helps him maintain ADLs without as much pain

## 2020-02-08 NOTE — Progress Notes (Signed)
Subjective:    Patient ID: Paul Drake, male    DOB: Nov 12, 1945, 74 y.o.   MRN: [id number]  Chief Complaint  Patient presents with  . Follow-up    parkinsons    HPI Patient is in today for follow up on chronic medical concerns. He continues to struggle with acting out kicking and screaming at night. Notes his constipation is better. He is eating more trail mix without candy. No other changes. He is noting increased neck pain. The Ehrenberg seems to respond to headache. Needs a 2 pm nap but that helps. Notes allergies and congestion are making the headaches worse. He is noting tinnitus bilaterally for quite some time but it is slightly worse.  He is walking 40-50 minutes a day and has cleaned up his diet. Eating better. Denies CP/palp/SOB/HA/congestion/fevers/GI or GU c/o. Taking meds as prescribed. His depression is much better and he is getting out more.   Past Medical History:  Diagnosis Date  . Allergic state 12/29/2013  . Arthritis   . Barrett's esophagus   . BPH (benign prostatic hyperplasia)   . Chicken pox   . CVA (cerebral infarction)   . Depression   . GERD (gastroesophageal reflux disease)   . History of kidney stones   . HTN (hypertension), benign 04/01/2015  . Hyperglycemia 01/06/2015  . Hyperlipemia   . Hypertension   . IBS (irritable bowel syndrome)   . Left hand pain 07/21/2017  . Measles as a child  . Medicare annual wellness visit, subsequent 06/26/2015  . Mumps as a child  . Otitis externa 12/10/2013  . Seasonal allergies    some asthma  . Stroke ([place])   . Unspecified asthma(493.90) 12/29/2013    Past Surgical History:  Procedure Laterality Date  . COLONOSCOPY    . EP IMPLANTABLE DEVICE N/A 10/16/2016   Procedure: Loop Recorder Insertion;  Surgeon: Thompson Grayer, MD;  Location: [facility];  Service: Cardiovascular;  Laterality: N/A;  . LITHOTRIPSY     multiple times  . PROSTATE SURGERY  08/2017  . TEE WITHOUT CARDIOVERSION N/A 09/22/2014   Procedure:  TRANSESOPHAGEAL ECHOCARDIOGRAM (TEE);  Surgeon: Josue Hector, MD;  Location[facility];  Service: Cardiovascular;  Laterality: N/A;  . WISDOM TOOTH EXTRACTION      Family History  Problem Relation Age of Onset  . Hypertension Mother   . Hyperlipidemia Mother   . Fibromyalgia Mother   . Arthritis Mother        rheumatoid  . Diabetes Sister        type 2  . Hyperlipidemia Brother   . Hypertension Brother   . Ulcers Father 36       Bleeding Ulcers  . Kidney Stones Daughter   . Asthma Daughter   . Healthy Son   . Cancer Maternal Grandfather        skin ?  . Stroke Maternal Aunt   . Cancer Maternal Uncle        prostate    Social History   Socioeconomic History  . Marital status: Married    Spouse name: Webb Silversmith  . Number of children: 3  . Years of education: college  . Highest education level: Master's degree (e.g., MA, MS, MEng, MEd, MSW, MBA)  Occupational History  . Occupation: retired    Comment: retired  Tobacco Use  . Smoking status: Never Smoker  . Smokeless tobacco: Never Used  Substance and Sexual Activity  . Alcohol use: Yes    Alcohol/week: 1.0 standard  drinks    Types: 1 Shots of liquor per week    Comment: 2 old fashions per evening  . Drug use: No  . 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 Not on file  Social History Narrative   Patient consumes 3 cups of caffeine daily   Left handed   Lives at home with his wife. They are living in their daughter's house to help to help take care of their granddaughter.      Patient uses a home gym 3-4 x a week.   Social Determinants of Health   Financial Resource Strain:   . Difficulty of Paying Living Expenses:   Food Insecurity:   . Worried About Charity fundraiser in the Last Year:   . Arboriculturist in the Last Year:   Transportation Needs:   . Film/video editor (Medical):   Marland Kitchen Lack of Transportation  (Non-Medical):   Physical Activity:   . Days of Exercise per Week:   . Minutes of Exercise per Session:   Stress:   . Feeling of Stress :   Social Connections:   . Frequency of Communication with Friends and Family:   . Frequency of Social Gatherings with Friends and Family:   . Attends Religious Services:   . Active Member of Clubs or Organizations:   . Attends Archivist Meetings:   Marland Kitchen Marital Status:   Intimate Partner Violence:   . Fear of Current or Ex-Partner:   . Emotionally Abused:   Marland Kitchen Physically Abused:   . Sexually Abused:     Outpatient Medications Prior to Visit  Medication Sig Dispense Refill  . albuterol (PROVENTIL HFA;VENTOLIN HFA) 108 (90 Base) MCG/ACT inhaler Inhale 2 puffs into the lungs every 6 (six) hours as needed for wheezing or shortness of breath. 1 Inhaler 3  . alfuzosin (UROXATRAL) 10 MG 24 hr tablet Take 10 mg by mouth daily with breakfast.    . ALPRAZolam (XANAX) 0.25 MG tablet Take 1 tablet (0.25 mg total) by mouth 2 (two) times daily as needed for anxiety. 20 tablet 0  . carbidopa-levodopa (SINEMET IR) 25-100 MG tablet Take 1 tablet by mouth 3 (three) times daily. (Patient taking differently: Take 1 tablet by mouth 3 (three) times daily. Take 1 pill in the am, 1/2 in the mid day and 1 pill at qhs) 90 tablet 3  . cetirizine (ZYRTEC) 10 MG tablet Take 10 mg by mouth daily.    . clopidogrel (PLAVIX) 75 MG tablet TAKE 1 TABLET(75 MG) BY MOUTH DAILY (Patient taking differently: Take 75 mg by mouth daily. ) 90 tablet 3  . DULoxetine (CYMBALTA) 60 MG capsule Take 60 mg by mouth daily.    . fluticasone (FLONASE) 50 MCG/ACT nasal spray Place 2 sprays into both nostrils daily. (Patient taking differently: Place 2 sprays into both nostrils daily as needed for allergies. ) 16 g 6  . gabapentin (NEURONTIN) 100 MG capsule TAKE 2 CAPSULES(200 MG) BY MOUTH THREE TIMES DAILY 540 capsule 1  . HYDROcodone-acetaminophen (NORCO/VICODIN) 5-325 MG tablet Take 1 tablet  by mouth every 6 (six) hours as needed for moderate pain.    . Menthol, Topical Analgesic, (BIOFREEZE EX) Apply 1 application topically daily as needed (hand pain).    . Multiple Vitamins-Minerals (MULTIVITAMINS THER. W/MINERALS) TABS tablet Take 1 tablet by mouth daily.    . NONFORMULARY OR COMPOUNDED ITEM Neuropathic cream -  apply 1 to 2 g to left hand 3-4 times daily as needed 1 each 3  . Omega-3 Fatty Acids (OMEGA-3 PO) Take 350 mg by mouth daily.     . pantoprazole (PROTONIX) 40 MG tablet TAKE 1 TABLET(40 MG) BY MOUTH DAILY 90 tablet 1  . simvastatin (ZOCOR) 20 MG tablet TAKE 1 TABLET BY MOUTH DAILY 90 tablet 1  . venlafaxine XR (EFFEXOR-XR) 150 MG 24 hr capsule Take 1 capsule (150 mg total) by mouth at bedtime. 30 capsule 3  . losartan (COZAAR) 100 MG tablet TAKE 1 TABLET(100 MG) BY MOUTH DAILY AS DIRECTED 90 tablet 1   No facility-administered medications prior to visit.    Allergies  Allergen Reactions  . Molds & Smuts Other (See Comments)  . Pollen Extract Other (See Comments)    Runny nose,itchy eyes    Review of Systems  Constitutional: Positive for malaise/fatigue. Negative for fever.  HENT: Negative for congestion.   Eyes: Negative for blurred vision.  Respiratory: Negative for shortness of breath.   Cardiovascular: Negative for chest pain, palpitations and leg swelling.  Gastrointestinal: Negative for abdominal pain, blood in stool and nausea.  Genitourinary: Negative for dysuria and frequency.  Musculoskeletal: Negative for falls.  Skin: Negative for rash.  Neurological: Positive for tremors. Negative for dizziness, loss of consciousness and headaches.  Endo/Heme/Allergies: Negative for environmental allergies.  Psychiatric/Behavioral: Negative for depression. The patient is not nervous/anxious.        Objective:    Physical Exam Vitals and nursing note reviewed.  Constitutional:      General: He is not in acute distress.    Appearance: He is well-developed.    HENT:     Head: Normocephalic and atraumatic.     Nose: Nose normal.  Eyes:     General:        Right eye: No discharge.        Left eye: No discharge.  Cardiovascular:     Rate and Rhythm: Normal rate and regular rhythm.     Heart sounds: No murmur.  Pulmonary:     Effort: Pulmonary effort is normal.     Breath sounds: Normal breath sounds.  Abdominal:     General: Bowel sounds are normal.     Palpations: Abdomen is soft.     Tenderness: There is no abdominal tenderness.  Musculoskeletal:     Cervical back: Normal range of motion and neck supple.  Skin:    General: Skin is warm and dry.  Neurological:     Mental Status: He is alert and oriented to person, place, and time.     BP 120/78 (BP Location: Left Arm, Patient Position: Sitting, Cuff Size: Normal)   Pulse 81   Temp (!) 97 F (36.1 C) (Temporal)   Ht 5\' 8"  (1.727 m)   Wt 172 lb (78 kg)   SpO2 99%   BMI 26.15 kg/m  Wt Readings from Last 3 Encounters:  02/08/20 172 lb (78 kg)  01/07/20 170 lb 9.6 oz (77.4 kg)  11/09/19 178 lb 3.2 oz (80.8 kg)    Diabetic Foot Exam - Simple   No data filed     Lab Results  Component Value Date   WBC 5.9 09/28/2019   HGB 13.5 09/28/2019   HCT 40.4 09/28/2019   PLT 169.0 09/28/2019   GLUCOSE 93 09/28/2019   CHOL 166 09/28/2019   TRIG 177.0 (H) 09/28/2019   HDL 72.90 09/28/2019   LDLDIRECT 76.0 12/31/2014   LDLCALC 57  09/28/2019   ALT 12 09/28/2019   AST 18 09/28/2019   NA 139 09/28/2019   K 4.2 09/28/2019   CL 103 09/28/2019   CREATININE 1.08 09/28/2019   BUN 23 09/28/2019   CO2 30 09/28/2019   TSH 1.39 09/28/2019   INR 0.98 09/20/2014   HGBA1C 5.5 09/28/2019    Lab Results  Component Value Date   TSH 1.39 09/28/2019   Lab Results  Component Value Date   WBC 5.9 09/28/2019   HGB 13.5 09/28/2019   HCT 40.4 09/28/2019   MCV 98.1 09/28/2019   PLT 169.0 09/28/2019   Lab Results  Component Value Date   NA 139 09/28/2019   K 4.2 09/28/2019   CO2 30  09/28/2019   GLUCOSE 93 09/28/2019   BUN 23 09/28/2019   CREATININE 1.08 09/28/2019   BILITOT 0.5 09/28/2019   ALKPHOS 59 09/28/2019   AST 18 09/28/2019   ALT 12 09/28/2019   PROT 6.7 09/28/2019   ALBUMIN 4.3 09/28/2019   CALCIUM 9.6 09/28/2019   ANIONGAP 7 09/14/2016   GFR 66.60 09/28/2019   Lab Results  Component Value Date   CHOL 166 09/28/2019   Lab Results  Component Value Date   HDL 72.90 09/28/2019   Lab Results  Component Value Date   LDLCALC 57 09/28/2019   Lab Results  Component Value Date   TRIG 177.0 (H) 09/28/2019   Lab Results  Component Value Date   CHOLHDL 2 09/28/2019   Lab Results  Component Value Date   HGBA1C 5.5 09/28/2019       Assessment & Plan:   Problem List Items Addressed This Visit    GERD (gastroesophageal reflux disease)    Avoid offending foods, start probiotics. Do not eat large meals in late evening and consider raising head of bed.       Essential hypertension    Well controlled without Losartan. Encouraged heart healthy diet such as the DASH diet and exercise as tolerated. He feels well. Numbers at home 112 to 120s over 60s and 70s. He is no longer feeling light headed. Sees an occasional 130 but he felt fine.       Relevant Orders   CBC   Comprehensive metabolic panel   TSH   Allergic rhinitis    He recently went to the beach and his breathing was much better at the beach. No congestion at this time      Hyperglycemia    hgba1c acceptable, minimize simple carbs. Increase exercise as tolerated.      HLD (hyperlipidemia)    Encouraged heart healthy diet, increase exercise, avoid trans fats, consider a krill oil cap daily      Relevant Orders   Lipid panel   Left hand pain    Levodopa helps the hand pain some. Gabapentin 1 tab tid (occasionally qid) helps him maintain ADLs without as much pain      Vertebrobasilar artery syndrome   Parkinson's disease ([place])    He has an appt with Dr Carles Collet on June 15 to  [facility] care and he is very pleased      Tinnitus of both ears   Headache    Worsening often starts in the back of the neck. Otherwise no obvious pattern. Does not endorse am headaches. The headaches generalize over the head entirely. Notes nausea worsens with bad headache. Eating helps some does not note photophobia, phonophobia most of the time. Encouraged increased hydration, 64 ounces of clear fluids daily. Minimize alcohol  and caffeine. Eat small frequent meals with lean proteins and complex carbs. Avoid high and low blood sugars. Get adequate sleep, 7-8 hours a night. Needs exercise daily preferably in the morning.       Other Visit Diagnoses    Night terror    -  Primary   Relevant Orders   Ambulatory referral to Psychiatry      I have discontinued Yousef Petrosyan's losartan. I am also having him maintain his multivitamins ther. w/minerals, Omega-3 Fatty Acids (OMEGA-3 PO), albuterol, fluticasone, clopidogrel, alfuzosin, cetirizine, (Menthol, Topical Analgesic, (BIOFREEZE EX)), ALPRAZolam, NONFORMULARY OR COMPOUNDED ITEM, pantoprazole, HYDROcodone-acetaminophen, venlafaxine XR, simvastatin, gabapentin, carbidopa-levodopa, and DULoxetine.  No orders of the defined types were placed in this encounter.    Penni Homans, MD

## 2020-02-08 NOTE — Assessment & Plan Note (Signed)
Avoid offending foods, start probiotics. Do not eat large meals in late evening and consider raising head of bed.  

## 2020-02-08 NOTE — Patient Instructions (Signed)
Try moist heat and topical treatments such as Lidocaine to neck and shoulders Report worsening   Cervicogenic Headache  A cervicogenic headache is a headache caused by a condition that affects the bones and tissues in your neck (cervical spine). In a cervicogenic headache, the pain moves from your neck to your head. Most cervicogenic headaches start in the upper part of the neck with the first three cervical bones (cervical vertebrae). A cervicogenic headache is diagnosed when a cause can be found in the cervical spine and other causes of headaches can be ruled out. What are the causes? The most common cause of this condition is a traumatic injury to the cervical spine, such as whiplash. Other causes include:  Arthritis.  Broken bone (fracture).  Infection.  Tumor. What are the signs or symptoms? The most common symptoms are neck and head pain. The pain is often located on one side. In some cases, there may be head pain without neck pain. Pain may be felt in the neck, back or side of the head, face, or behind the eyes. Other symptoms include:  Limited movement in the neck.  Arm or shoulder pain. How is this diagnosed? This condition may be diagnosed based on:  Your symptoms.  A physical exam.  An injection that blocks nerve signals (nerve block).  Imaging tests, such as: ? X-rays. ? CT scan. ? MRI. How is this treated? Treatment for this condition may depend on the underlying condition. Treatment may include:  Medicines, such as: ? NSAIDs. ? Muscle relaxants.  Physical therapy.  Massage therapy.  Complementary therapies, such as: ? Biofeedback. ? Meditation. ? Acupuncture.  Nerve block injections.  Botulinum toxin injections. Your treatment plan may involve working with a pain management team that includes your primary health care provider, a pain management specialist, a neurologist, and a physical therapist. Follow these instructions at home:  Take  over-the-counter and prescription medicines only as told by your health care provider.  Do exercises at home as told by your physical therapist.  Return to your normal activities as told by your health care provider. Ask your health care provider what activities are safe for you. Avoid activities that trigger your headaches.  Maintain good neck support and posture at home and at work.  Keep all follow-up visits as told by your health care provider. This is important. Contact a health care provider if you have:  Headaches that are getting worse and happening more often.  Headaches with any of the following: ? Fever. ? Numbness. ? Weakness. ? Dizziness. ? Nausea or vomiting. Get help right away if:  You have a very sudden and severe headache. Summary  A cervicogenic headache is a headache caused by a condition that affects the bones and tissues in your cervical spine.  Your health care provider may diagnose this condition with a physical exam, a nerve block, and imaging tests.  Treatment may include medicine to reduce pain and inflammation, physical therapy, and nerve block injections.  Complementary therapies, such as acupuncture and meditation, may be added to other treatments.  Your treatment plan may involve working with a pain management team that includes your primary health care provider, a pain management specialist, a neurologist, and a physical therapist. This information is not intended to replace advice given to you by your health care provider. Make sure you discuss any questions you have with your health care provider. Document Revised: 12/24/2018 Document Reviewed: 09/13/2017 Elsevier Patient Education  [place].

## 2020-02-08 NOTE — Assessment & Plan Note (Signed)
He has an appt with Dr Carles Collet on June 15 to [facility] care and he is very pleased

## 2020-02-08 NOTE — Assessment & Plan Note (Signed)
He recently went to the beach and his breathing was much better at the beach. No congestion at this time

## 2020-02-08 NOTE — Assessment & Plan Note (Signed)
Worsening often starts in the back of the neck. Otherwise no obvious pattern. Does not endorse am headaches. The headaches generalize over the head entirely. Notes nausea worsens with bad headache. Eating helps some does not note photophobia, phonophobia most of the time. Encouraged increased hydration, 64 ounces of clear fluids daily. Minimize alcohol and caffeine. Eat small frequent meals with lean proteins and complex carbs. Avoid high and low blood sugars. Get adequate sleep, 7-8 hours a night. Needs exercise daily preferably in the morning.

## 2020-02-08 NOTE — Assessment & Plan Note (Signed)
hgba1c acceptable, minimize simple carbs. Increase exercise as tolerated.  

## 2020-02-08 NOTE — Assessment & Plan Note (Addendum)
Well controlled without Losartan. Encouraged heart healthy diet such as the DASH diet and exercise as tolerated. He feels well. Numbers at home 112 to 120s over 60s and 70s. He is no longer feeling light headed. Sees an occasional 130 but he felt fine.

## 2020-02-09 ENCOUNTER — Other Ambulatory Visit: Payer: Self-pay | Admitting: Family Medicine

## 2020-02-09 ENCOUNTER — Other Ambulatory Visit: Payer: Medicare Other

## 2020-02-09 DIAGNOSIS — D649 Anemia, unspecified: Secondary | ICD-10-CM | POA: Diagnosis not present

## 2020-02-11 ENCOUNTER — Other Ambulatory Visit: Payer: Self-pay | Admitting: Family Medicine

## 2020-02-11 LAB — INTRINSIC FACTOR ANTIBODIES: Intrinsic Factor: NEGATIVE

## 2020-02-16 ENCOUNTER — Telehealth: Payer: Self-pay | Admitting: Family Medicine

## 2020-02-16 NOTE — Telephone Encounter (Signed)
He needs Vitamin B 12 1000 mcg tabs, 1 tab sl qd

## 2020-02-16 NOTE — Telephone Encounter (Signed)
Patient notified

## 2020-02-16 NOTE — Telephone Encounter (Signed)
Caller: [name] Call back phone number: [phone]  Patient states he was told to take some type of vitamin B. He can't remember exactly which one.  Please advise.

## 2020-02-24 NOTE — Progress Notes (Signed)
Assessment/Plan:    1.  Parkinson's disease, diagnosed by Dr. Leonie Man in February, 2021.  -continue carbidopa/levodopa but change the timing to 7am/11am/4pm  -We discussed the diagnosis as well as pathophysiology of the disease.  We discussed treatment options as well as prognostic indicators.  Patient education was provided.  -We discussed that it used to be thought that levodopa would increase risk of melanoma but now it is believed that Parkinsons itself likely increases risk of melanoma. he is to get regular skin checks.  -We talked about medication options as well as potential future surgical options.  We talked about safety in the home.  -I will refer the patient to the Parkinson's program  [facility]  -We discussed community resources in the area including patient support groups and community exercise programs for PD and pt education was provided to the patient.  2.  History of multiple strokes  -Has a history of right MCA infarct in 2008.  He had a small infarct adjacent to the initial infarct in January, 2016 and a TIA in December, 2017.  Pt with residual paresthesias on the L  -Patient on Plavix.  3.  REM behavior disorder  -This is commonly associated with PD and the patient is experiencing this.  We discussed that this can be very serious and even harmful.  We talked about medications as well as physical barriers to put in the bed (particularly soft bed rails, pillow barriers).  We talked about moving the night stand so that it is not so close to the side of the bed.  Pt would like to try meds as he is hitting wife and he has fallen out of the bed.    -we will initiate klonopin, 0.25 mg q hs.  R/B/SE were discussed.  The opportunity to ask questions was given and they were answered to the best of my ability.  The patient expressed understanding and willingness to follow the outlined treatment protocols.  4. Sialorrhea  -This is commonly associated with PD.  We  talked about treatments.  The patient is not a candidate for oral anticholinergic therapy because of increased risk of confusion and falls.  We discussed Botox (type A and B) and 1% atropine drops.  We discusssed that candy like lemon drops can help by stimulating mm of the oropharynx to induce swallowing.  5.  Anxiety and depression  -Discussed counseling as part of multidisciplinary/interdisciplinary care.  He will think about that.  6.   Did discuss with the patient the importance of finding one neurologist and sticking with that neurologist.  He has been back and forth between our office and [facility] neurology for care.  Subjective:   Paul Drake was seen today in the movement disorders clinic for neurologic consultation at the request of Mosie Lukes, MD.  The consultation is for the evaluation of Parkinsons Disease.  Patient has seen several different neurologists over the years, including Dr. Erlinda Hong, then he transferred care to Dr. Tomi Likens and then he transferred care back to [facility] neurology.  Patient primarily was seen for history of stroke until February, 2021.  At that point, he complained about new onset right hand tremor.  He was seen by Dr. Leonie Man diagnosed with Parkinson's disease.  He was started on levodopa.  He initially had some dizziness, but after stopping his blood pressure medication, the dizziness resolved.   Specific Symptoms:  Tremor: Yes.  , R hand/arm (pt is L handed) Family hx  of similar:  No. Voice: wife c/o softer speech Sleep: trouble getting/staying asleep both  Vivid Dreams:  Yes.   - little better as of late  Acting out dreams:  Yes.  , sleep talk, fall out of the bed Wet Pillows: Yes.   Postural symptoms:  Yes.  , some off since stroke in 2008   Falls?  Yes.  , last one about  Month ago - dog caused him to get tangled in leash and fell; biggest fall was a year ago down steps at daughters house.  Falls overall better Bradykinesia symptoms: drooling while awake  and difficulty getting out of a chair Loss of smell:  yes Loss of taste:  Yes.   Urinary Incontinence:  No. (better with prostate sx) Difficulty Swallowing:  No. Handwriting, micrographia: sometimes Trouble with ADL's:  No.  Trouble buttoning clothing: No. (some but mostly due to paresthesias of the fingers from old stroke on the L) Depression:  Yes.   and very anxious on the L Memory changes:  Yes.  , mild - "I may forget little things to do that day." Hallucinations:  No.  visual distortions: Yes.  , "a little" N/V:  Yes.  , a little nausea but no emesis Lightheaded:  Much better since BP med d/c  Syncope: No. Diplopia:  No. Dyskinesia:  No. Prior exposure to reglan/antipsychotics: No.   As above, patient does have a history of stroke.  Has a history of right MCA infarct in 2008.  He had a small infarct adjacent to the initial infarct in January, 2016 and a TIA in December, 2017.  Current movement med:  carbidopa/levodopa 25/100, 1 po tid (7am/2pm/10pm is currently when patient taking it)  Neuroimaging of the brain has previously been performed.  It is available for my review today.  There was last performed on December 02, 2019.  This was done with and without gadolinium.  There was an old right posterior parietal infarct.  There is cerebral small vessel disease, mild.  I personally reviewed it.  ALLERGIES:   Allergies  Allergen Reactions  . Molds & Smuts Other (See Comments)  . Pollen Extract Other (See Comments)    Runny nose,itchy eyes (trees, grasses, etc)     CURRENT MEDICATIONS:  Current Outpatient Medications  Medication Instructions  . albuterol (PROVENTIL HFA;VENTOLIN HFA) 108 (90 Base) MCG/ACT inhaler 2 puffs, Inhalation, Every 6 hours PRN  . alfuzosin (UROXATRAL) 10 mg, Oral, Daily with breakfast  . ALPRAZolam (XANAX) 0.25 mg, Oral, 2 times daily PRN  . carbidopa-levodopa (SINEMET IR) 25-100 MG tablet 1 tablet, Oral, 3 times daily  . cetirizine (ZYRTEC) 10 mg,  Oral, Daily  . clopidogrel (PLAVIX) 75 MG tablet TAKE 1 TABLET(75 MG) BY MOUTH DAILY  . fluticasone (FLONASE) 50 MCG/ACT nasal spray SHAKE LIQUID AND USE 2 SPRAYS IN EACH NOSTRIL DAILY  . gabapentin (NEURONTIN) 100 MG capsule TAKE 2 CAPSULES(200 MG) BY MOUTH THREE TIMES DAILY  . HYDROcodone-acetaminophen (NORCO/VICODIN) 5-325 MG tablet 1 tablet, Oral, Every 6 hours PRN  . Menthol, Topical Analgesic, (BIOFREEZE EX) 1 application, Topical, Daily PRN  . Multiple Vitamins-Minerals (MULTIVITAMINS THER. W/MINERALS) TABS tablet 1 tablet, Oral, Daily  . NONFORMULARY OR COMPOUNDED ITEM Neuropathic cream - apply 1 to 2 g to left hand 3-4 times daily as needed  . Omega-3 Fatty Acids (OMEGA-3 PO) 350 mg, Oral, Daily  . pantoprazole (PROTONIX) 40 MG tablet TAKE 1 TABLET(40 MG) BY MOUTH DAILY  . simvastatin (ZOCOR) 20 MG tablet TAKE 1 TABLET  BY MOUTH DAILY  . venlafaxine XR (EFFEXOR-XR) 150 mg, Oral, Daily at bedtime    Objective:   VITALS:   Vitals:   03/01/20 1336  BP: 125/85  Pulse: 70  SpO2: 97%  Weight: 177 lb (80.3 kg)  Height: 5\' 8"  (1.727 m)    GEN:  The patient appears stated age and is in NAD. HEENT:  Normocephalic, atraumatic.  The mucous membranes are moist. The superficial temporal arteries are without ropiness or tenderness. CV:  RRR Lungs:  CTAB Neck/HEME:  There are no carotid bruits bilaterally.  Neurological examination:  Orientation: The patient is alert and oriented x3.  Cranial nerves: There is good facial symmetry. Extraocular muscles are intact. The visual fields are full to confrontational testing. The speech is fluent and clear. Soft palate rises symmetrically and there is no tongue deviation. Hearing is intact to conversational tone. Sensation: Sensation is intact to light and pinprick throughout (facial, trunk, extremities). Vibration is intact at the bilateral big toe. There is no extinction with double simultaneous stimulation. There is no sensory dermatomal level  identified. Motor: Strength is 5/5 in the bilateral upper and lower extremities.   Shoulder shrug is equal and symmetric.  There is no pronator drift. Deep tendon reflexes: Deep tendon reflexes are 0-1/4 at the bilateral biceps, triceps, brachioradialis, patella and achilles. Plantar responses are downgoing bilaterally.  Movement examination: Tone: There is normal tone in the bilateral upper extremities.  The tone in the lower extremities is normal.  Abnormal movements: there is RUE rest tremor.  There is some ?mild dyskinesia in the L hand Coordination:  There is no decremation with RAM's, with any form of RAMS, including alternating supination and pronation of the forearm, hand opening and closing, finger taps, heel taps and toe taps. Gait and Station: The patient has no difficulty arising out of a deep-seated chair without the use of the hands. The patient's stride length is good.    I have reviewed and interpreted the following labs independently   Chemistry      Component Value Date/Time   NA 142 02/08/2020 1124   K 4.1 02/08/2020 1124   CL 104 02/08/2020 1124   CO2 31 02/08/2020 1124   BUN 16 02/08/2020 1124   CREATININE 1.03 02/08/2020 1124   CREATININE 1.11 12/29/2013 1145      Component Value Date/Time   CALCIUM 9.1 02/08/2020 1124   ALKPHOS 60 02/08/2020 1124   AST 16 02/08/2020 1124   ALT 7 02/08/2020 1124   BILITOT 0.7 02/08/2020 1124      Lab Results  Component Value Date   TSH 1.69 02/08/2020   Lab Results  Component Value Date   WBC 5.0 02/08/2020   HGB 12.8 (L) 02/08/2020   HCT 37.8 (L) 02/08/2020   MCV 98.1 02/08/2020   PLT 158.0 02/08/2020     Total time spent on today's visit was 65 minutes, including both face-to-face time and nonface-to-face time.  Time included that spent on review of records (prior notes available to me/labs/imaging if pertinent), discussing treatment and goals, answering patient's questions and coordinating care.  Cc:  Mosie Lukes, MD

## 2020-02-29 ENCOUNTER — Ambulatory Visit (INDEPENDENT_AMBULATORY_CARE_PROVIDER_SITE_OTHER): Payer: Medicare Other | Admitting: *Deleted

## 2020-02-29 DIAGNOSIS — I63411 Cerebral infarction due to embolism of right middle cerebral artery: Secondary | ICD-10-CM

## 2020-03-01 ENCOUNTER — Other Ambulatory Visit: Payer: Self-pay

## 2020-03-01 ENCOUNTER — Telehealth: Payer: Self-pay

## 2020-03-01 ENCOUNTER — Encounter: Payer: Self-pay | Admitting: Neurology

## 2020-03-01 ENCOUNTER — Ambulatory Visit (INDEPENDENT_AMBULATORY_CARE_PROVIDER_SITE_OTHER): Payer: Medicare Other | Admitting: Neurology

## 2020-03-01 VITALS — BP 125/85 | HR 70 | Ht 68.0 in | Wt 177.0 lb

## 2020-03-01 DIAGNOSIS — I63411 Cerebral infarction due to embolism of right middle cerebral artery: Secondary | ICD-10-CM

## 2020-03-01 DIAGNOSIS — G4752 REM sleep behavior disorder: Secondary | ICD-10-CM | POA: Diagnosis not present

## 2020-03-01 DIAGNOSIS — K117 Disturbances of salivary secretion: Secondary | ICD-10-CM

## 2020-03-01 DIAGNOSIS — G20 Parkinson's disease: Secondary | ICD-10-CM | POA: Diagnosis not present

## 2020-03-01 LAB — CUP PACEART REMOTE DEVICE CHECK
Date Time Interrogation Session: 20210613235822
Implantable Pulse Generator Implant Date: 20180130

## 2020-03-01 MED ORDER — CLONAZEPAM 0.5 MG PO TABS
0.2500 mg | ORAL_TABLET | Freq: Every day | ORAL | 1 refills | Status: DC
Start: 2020-03-01 — End: 2020-08-09

## 2020-03-01 NOTE — Telephone Encounter (Signed)
When I was discharging patient he had another question for Dr Tat. He wanted to know if his body will produce more dopamine if he exercises harder.   Spoke with Dr Tat and she stated that he his body will not produce more dopamine his body will just release it faster.   Contacted patient and informed him of Dr Doristine Devoid response. He voiced understanding and thanked me for calling.

## 2020-03-01 NOTE — Patient Instructions (Addendum)
1.  Change carbidopa/levodopa 25/100 to 7am/11am/4pm 2.  We [facility] 3.  Let us know if you would like to consider botox for the drooling 4.  Start clonazepam, 0.5mg , 1/2 tablet at bedtime for the acting out of the dreams 5.  Let us know if you want a referral for the counseling for mood.  The physicians and staff at [facility] are committed to providing excellent care. You may receive a survey requesting feedback about your experience at our office. We strive to receive "very good" responses to the survey questions. If you feel that your experience would prevent you from giving the office a "very good " response, please contact our office to try to remedy the situation. We may be reached at [phone]. Thank you for taking the time out of your busy day to complete the survey.

## 2020-03-01 NOTE — Progress Notes (Signed)
Carelink Summary Report / Loop Recorder 

## 2020-03-07 ENCOUNTER — Telehealth: Payer: Self-pay | Admitting: Neurology

## 2020-03-07 NOTE — Telephone Encounter (Signed)
Relayed info from Dr Tat to try dosing at 7AM, 1PM and 7PM, pt verbalized understanding and will try this schedule and notify if any further issues.

## 2020-03-07 NOTE — Telephone Encounter (Signed)
Have him try it at 7am/1pm/7pm instead and see how he does.

## 2020-03-07 NOTE — Telephone Encounter (Signed)
Patient called and said he is having problems "taking more daytime doses of carbidopa-levodopa" 25-100 MG. He said, "The doses are too close together I think now. My concentration is not good, my balance and coordination have gotten worse, I'm dizzy, and its just making me a mess." Patient requests recommendations to help with this.

## 2020-03-28 ENCOUNTER — Other Ambulatory Visit: Payer: Self-pay

## 2020-03-28 ENCOUNTER — Other Ambulatory Visit: Payer: Self-pay | Admitting: Family Medicine

## 2020-03-28 ENCOUNTER — Encounter: Payer: Self-pay | Admitting: Family Medicine

## 2020-03-28 ENCOUNTER — Ambulatory Visit (INDEPENDENT_AMBULATORY_CARE_PROVIDER_SITE_OTHER): Payer: Medicare Other | Admitting: Family Medicine

## 2020-03-28 ENCOUNTER — Other Ambulatory Visit: Payer: Self-pay | Admitting: Neurology

## 2020-03-28 VITALS — BP 122/78 | HR 73 | Temp 97.7°F | Ht 68.0 in | Wt 176.2 lb

## 2020-03-28 DIAGNOSIS — I6389 Other cerebral infarction: Secondary | ICD-10-CM

## 2020-03-28 DIAGNOSIS — M67432 Ganglion, left wrist: Secondary | ICD-10-CM

## 2020-03-28 NOTE — Patient Instructions (Addendum)
Ganglion Cyst  A ganglion cyst is a non-cancerous, fluid-filled lump that occurs near a joint or tendon. The cyst grows out of a joint or the lining of a tendon. Ganglion cysts most often develop in the hand or wrist, but they can also develop in the shoulder, elbow, hip, knee, ankle, or foot. Ganglion cysts are ball-shaped or egg-shaped. Their size can range from the size of a pea to larger than a grape. Increased activity may cause the cyst to get bigger because more fluid starts to build up. What are the causes? The exact cause of this condition is not known, but it may be related to:  Inflammation or irritation around the joint.  An injury.  Repetitive movements or overuse.  Arthritis. What increases the risk? You are more likely to develop this condition if:  You are a woman.  You are 40-59 years old. What are the signs or symptoms? The main symptom of this condition is a lump. It most often appears on the hand or wrist. In many cases, there are no other symptoms, but a cyst can sometimes cause:  Tingling.  Pain.  Numbness.  Muscle weakness.  Weak grip.  Less range of motion in a joint. How is this diagnosed? Ganglion cysts are usually diagnosed based on a physical exam. Your health care provider will feel the lump and may shine a light next to it. If it is a ganglion cyst, the light will likely shine through it. Your health care provider may order an X-ray, ultrasound, or MRI to rule out other conditions. How is this treated? Ganglion cysts often go away on their own without treatment. If you have pain or other symptoms, treatment may be needed. Treatment is also needed if the ganglion cyst limits your movement or if it gets infected. Treatment may include:  Wearing a brace or splint on your wrist or finger.  Taking anti-inflammatory medicine.  Having fluid drained from the lump with a needle (aspiration).  Getting a steroid injected into the joint.  Having  surgery to remove the ganglion cyst.  Placing a pad on your shoe or wearing shoes that will not rub against the cyst if it is on your foot. Follow these instructions at home:  Do not press on the ganglion cyst, poke it with a needle, or hit it.  Take over-the-counter and prescription medicines only as told by your health care provider.  If you have a brace or splint: ? Wear it as told by your health care provider. ? Remove it as told by your health care provider. Ask if you need to remove it when you take a shower or a bath.  Watch your ganglion cyst for any changes.  Keep all follow-up visits as told by your health care provider. This is important. Contact a health care provider if:  Your ganglion cyst becomes larger or more painful.  You have pus coming from the lump.  You have weakness or numbness in the affected area.  You have a fever or chills. Get help right away if:  You have a fever and have any of these in the cyst area: ? Increased redness. ? Red streaks. ? Swelling. Summary  A ganglion cyst is a non-cancerous, fluid-filled lump that occurs near a joint or tendon.  Ganglion cysts most often develop in the hand or wrist, but they can also develop in the shoulder, elbow, hip, knee, ankle, or foot.  Ganglion cysts often go away on their own without treatment.  This information is not intended to replace advice given to you by your health care provider. Make sure you discuss any questions you have with your health care provider. Document Revised: 08/16/2017 Document Reviewed: 05/03/2017 Elsevier Patient Education  2020 [employer]  

## 2020-03-28 NOTE — Progress Notes (Signed)
Chief Complaint  Patient presents with  . Cyst    left wrist    Subjective: Patient is a 74 y.o. male here for cyst.  Dx'd by me w ganglion cyst of wrist 2 yrs ago and had it aspirated. Recently came back, would like repeat procedure. Painful when he bends his wrist in a certain manner.    Past Medical History:  Diagnosis Date  . Allergic state 12/29/2013  . Arthritis   . Barrett's esophagus   . BPH (benign prostatic hyperplasia)   . Chicken pox   . CVA (cerebral infarction)   . Depression   . GERD (gastroesophageal reflux disease)   . History of kidney stones   . HTN (hypertension), benign 04/01/2015  . Hyperglycemia 01/06/2015  . Hyperlipemia   . Hypertension   . IBS (irritable bowel syndrome)   . Left hand pain 07/21/2017  . Measles as a child  . Medicare annual wellness visit, subsequent 06/26/2015  . Mumps as a child  . Otitis externa 12/10/2013  . Seasonal allergies    some asthma  . Stroke ([place])   . Unspecified asthma(493.90) 12/29/2013    Objective: BP 122/78 (BP Location: Left Arm, Patient Position: Sitting, Cuff Size: Normal)   Pulse 73   Temp 97.7 F (36.5 C) (Oral)   Ht 5\' 8"  (1.727 m)   Wt 176 lb 4 oz (79.9 kg)   SpO2 97%   BMI 26.80 kg/m  General: Awake, appears stated age Skin: Lat volar surface of wrist, there is a flesh colored and dome-shaped lesion that contains a semi-solid substance. No erythema, ttp, drainage.  Lungs: No accessory muscle use Psych: Age appropriate judgment and insight, normal affect and mood  Procedure note; ganglion cyst aspiration Verbal consent obtained Area of interest was palpated and cleaned w alcohol x1 Freeze spray used for topical anesthesia A 21-g needle was inserted and a 3 mL syringe used to create a vacuum. Approx 0.4 cc's of gelatinous substance removed.  Area was bandaged. Pt tolerated well w no immediate complications noted.  Assessment and Plan: Ganglion cyst of volar aspect of left wrist - Plan: PR  ASPIRAT/INJECTION GANGLION CYST(S)  Orders as above. Last time worked well. Stated I cannot guarantee this works for this long again, can recur in 50% of cases. Standing offer for hand specialist referral.  F/u prn.  The patient voiced understanding and agreement to the plan.  Paulina, DO 03/28/20  11:48 AM

## 2020-03-30 ENCOUNTER — Ambulatory Visit: Payer: Medicare Other | Admitting: Neurology

## 2020-04-04 ENCOUNTER — Ambulatory Visit (INDEPENDENT_AMBULATORY_CARE_PROVIDER_SITE_OTHER): Payer: Medicare Other | Admitting: *Deleted

## 2020-04-04 DIAGNOSIS — I63411 Cerebral infarction due to embolism of right middle cerebral artery: Secondary | ICD-10-CM

## 2020-04-04 LAB — CUP PACEART REMOTE DEVICE CHECK
Date Time Interrogation Session: 20210718230643
Implantable Pulse Generator Implant Date: 20180130

## 2020-04-05 DIAGNOSIS — R296 Repeated falls: Secondary | ICD-10-CM | POA: Diagnosis not present

## 2020-04-05 DIAGNOSIS — G20 Parkinson's disease: Secondary | ICD-10-CM | POA: Diagnosis not present

## 2020-04-05 DIAGNOSIS — M545 Low back pain: Secondary | ICD-10-CM | POA: Diagnosis not present

## 2020-04-06 NOTE — Progress Notes (Signed)
Carelink Summary Report / Loop Recorder 

## 2020-04-09 ENCOUNTER — Other Ambulatory Visit: Payer: Self-pay | Admitting: Family Medicine

## 2020-04-11 DIAGNOSIS — X32XXXS Exposure to sunlight, sequela: Secondary | ICD-10-CM | POA: Diagnosis not present

## 2020-04-11 DIAGNOSIS — L821 Other seborrheic keratosis: Secondary | ICD-10-CM | POA: Diagnosis not present

## 2020-04-11 DIAGNOSIS — L820 Inflamed seborrheic keratosis: Secondary | ICD-10-CM | POA: Diagnosis not present

## 2020-04-11 DIAGNOSIS — Z85828 Personal history of other malignant neoplasm of skin: Secondary | ICD-10-CM | POA: Diagnosis not present

## 2020-04-11 DIAGNOSIS — L570 Actinic keratosis: Secondary | ICD-10-CM | POA: Diagnosis not present

## 2020-04-11 DIAGNOSIS — L814 Other melanin hyperpigmentation: Secondary | ICD-10-CM | POA: Diagnosis not present

## 2020-04-11 DIAGNOSIS — D239 Other benign neoplasm of skin, unspecified: Secondary | ICD-10-CM | POA: Diagnosis not present

## 2020-04-13 DIAGNOSIS — R296 Repeated falls: Secondary | ICD-10-CM | POA: Diagnosis not present

## 2020-04-13 DIAGNOSIS — G20 Parkinson's disease: Secondary | ICD-10-CM | POA: Diagnosis not present

## 2020-04-13 DIAGNOSIS — M545 Low back pain: Secondary | ICD-10-CM | POA: Diagnosis not present

## 2020-04-14 ENCOUNTER — Ambulatory Visit (HOSPITAL_BASED_OUTPATIENT_CLINIC_OR_DEPARTMENT_OTHER)
Admission: RE | Admit: 2020-04-14 | Discharge: 2020-04-14 | Disposition: A | Discharge status: Home / Self Care | Payer: Medicare Other | Source: Ambulatory Visit | Attending: Family Medicine | Admitting: Family Medicine

## 2020-04-14 ENCOUNTER — Ambulatory Visit (INDEPENDENT_AMBULATORY_CARE_PROVIDER_SITE_OTHER): Payer: Medicare Other | Admitting: Family Medicine

## 2020-04-14 ENCOUNTER — Other Ambulatory Visit: Payer: Self-pay

## 2020-04-14 ENCOUNTER — Encounter: Payer: Self-pay | Admitting: Family Medicine

## 2020-04-14 VITALS — BP 120/80 | HR 69 | Temp 97.8°F | Resp 12 | Ht 68.0 in | Wt 170.8 lb

## 2020-04-14 DIAGNOSIS — G20 Parkinson's disease: Secondary | ICD-10-CM | POA: Diagnosis not present

## 2020-04-14 DIAGNOSIS — M7989 Other specified soft tissue disorders: Secondary | ICD-10-CM | POA: Diagnosis not present

## 2020-04-14 DIAGNOSIS — M19042 Primary osteoarthritis, left hand: Secondary | ICD-10-CM | POA: Diagnosis not present

## 2020-04-14 DIAGNOSIS — E785 Hyperlipidemia, unspecified: Secondary | ICD-10-CM

## 2020-04-14 DIAGNOSIS — R739 Hyperglycemia, unspecified: Secondary | ICD-10-CM | POA: Diagnosis not present

## 2020-04-14 DIAGNOSIS — M79645 Pain in left finger(s): Secondary | ICD-10-CM | POA: Insufficient documentation

## 2020-04-14 DIAGNOSIS — I10 Essential (primary) hypertension: Secondary | ICD-10-CM

## 2020-04-14 DIAGNOSIS — I63411 Cerebral infarction due to embolism of right middle cerebral artery: Secondary | ICD-10-CM

## 2020-04-14 NOTE — Patient Instructions (Addendum)
Shingrix is the new shingles shot, 2 shots over 2-6 months.   Hypertension, Adult High blood pressure (hypertension) is when the force of blood pumping through the arteries is too strong. The arteries are the blood vessels that carry blood from the heart throughout the body. Hypertension forces the heart to work harder to pump blood and may cause arteries to become narrow or stiff. Untreated or uncontrolled hypertension can cause a heart attack, heart failure, a stroke, kidney disease, and other problems. A blood pressure reading consists of a higher number over a lower number. Ideally, your blood pressure should be below 120/80. The first ("top") number is called the systolic pressure. It is a measure of the pressure in your arteries as your heart beats. The second ("bottom") number is called the diastolic pressure. It is a measure of the pressure in your arteries as the heart relaxes. What are the causes? The exact cause of this condition is not known. There are some conditions that result in or are related to high blood pressure. What increases the risk? Some risk factors for high blood pressure are under your control. The following factors may make you more likely to develop this condition:  Smoking.  Having type 2 diabetes mellitus, high cholesterol, or both.  Not getting enough exercise or physical activity.  Being overweight.  Having too much fat, sugar, calories, or salt (sodium) in your diet.  Drinking too much alcohol. Some risk factors for high blood pressure may be difficult or impossible to change. Some of these factors include:  Having chronic kidney disease.  Having a family history of high blood pressure.  Age. Risk increases with age.  Race. You may be at higher risk if you are African American.  Gender. Men are at higher risk than women before age 38. After age 63, women are at higher risk than men.  Having obstructive sleep apnea.  Stress. What are the signs or  symptoms? High blood pressure may not cause symptoms. Very high blood pressure (hypertensive crisis) may cause:  Headache.  Anxiety.  Shortness of breath.  Nosebleed.  Nausea and vomiting.  Vision changes.  Severe chest pain.  Seizures. How is this diagnosed? This condition is diagnosed by measuring your blood pressure while you are seated, with your arm resting on a flat surface, your legs uncrossed, and your feet flat on the floor. The cuff of the blood pressure monitor will be placed directly against the skin of your upper arm at the level of your heart. It should be measured at least twice using the same arm. Certain conditions can cause a difference in blood pressure between your right and left arms. Certain factors can cause blood pressure readings to be lower or higher than normal for a short period of time:  When your blood pressure is higher when you are in a health care provider's office than when you are at home, this is called white coat hypertension. Most people with this condition do not need medicines.  When your blood pressure is higher at home than when you are in a health care provider's office, this is called masked hypertension. Most people with this condition may need medicines to control blood pressure. If you have a high blood pressure reading during one visit or you have normal blood pressure with other risk factors, you may be asked to:  Return on a different day to have your blood pressure checked again.  Monitor your blood pressure at home for 1 week or  longer. If you are diagnosed with hypertension, you may have other blood or imaging tests to help your health care provider understand your overall risk for other conditions. How is this treated? This condition is treated by making healthy lifestyle changes, such as eating healthy foods, exercising more, and reducing your alcohol intake. Your health care provider may prescribe medicine if lifestyle changes  are not enough to get your blood pressure under control, and if:  Your systolic blood pressure is above 130.  Your diastolic blood pressure is above 80. Your personal target blood pressure may vary depending on your medical conditions, your age, and other factors. Follow these instructions at home: Eating and drinking   Eat a diet that is high in fiber and potassium, and low in sodium, added sugar, and fat. An example eating plan is called the DASH (Dietary Approaches to Stop Hypertension) diet. To eat this way: ? Eat plenty of fresh fruits and vegetables. Try to fill one half of your plate at each meal with fruits and vegetables. ? Eat whole grains, such as whole-wheat pasta, brown rice, or whole-grain bread. Fill about one fourth of your plate with whole grains. ? Eat or drink low-fat dairy products, such as skim milk or low-fat yogurt. ? Avoid fatty cuts of meat, processed or cured meats, and poultry with skin. Fill about one fourth of your plate with lean proteins, such as fish, chicken without skin, beans, eggs, or tofu. ? Avoid pre-made and processed foods. These tend to be higher in sodium, added sugar, and fat.  Reduce your daily sodium intake. Most people with hypertension should eat less than 1,500 mg of sodium a day.  Do not drink alcohol if: ? Your health care provider tells you not to drink. ? You are pregnant, may be pregnant, or are planning to become pregnant.  If you drink alcohol: ? Limit how much you use to:  0-1 drink a day for women.  0-2 drinks a day for men. ? Be aware of how much alcohol is in your drink. In the [place] one drink equals one 12 oz bottle of beer (355 mL), one 5 oz glass of wine (148 mL), or one 1 oz glass of hard liquor (44 mL). Lifestyle   Work with your health care provider to maintain a healthy body weight or to lose weight. Ask what an ideal weight is for you.  Get at least 30 minutes of exercise most days of the week. Activities may  include walking, swimming, or biking.  Include exercise to strengthen your muscles (resistance exercise), such as Pilates or lifting weights, as part of your weekly exercise routine. Try to do these types of exercises for 30 minutes at least 3 days a week.  Do not use any products that contain nicotine or tobacco, such as cigarettes, e-cigarettes, and chewing tobacco. If you need help quitting, ask your health care provider.  Monitor your blood pressure at home as told by your health care provider.  Keep all follow-up visits as told by your health care provider. This is important. Medicines  Take over-the-counter and prescription medicines only as told by your health care provider. Follow directions carefully. Blood pressure medicines must be taken as prescribed.  Do not skip doses of blood pressure medicine. Doing this puts you at risk for problems and can make the medicine less effective.  Ask your health care provider about side effects or reactions to medicines that you should watch for. Contact a health care provider  if you:  Think you are having a reaction to a medicine you are taking.  Have headaches that keep coming back (recurring).  Feel dizzy.  Have swelling in your ankles.  Have trouble with your vision. Get help right away if you:  Develop a severe headache or confusion.  Have unusual weakness or numbness.  Feel faint.  Have severe pain in your chest or abdomen.  Vomit repeatedly.  Have trouble breathing. Summary  Hypertension is when the force of blood pumping through your arteries is too strong. If this condition is not controlled, it may put you at risk for serious complications.  Your personal target blood pressure may vary depending on your medical conditions, your age, and other factors. For most people, a normal blood pressure is less than 120/80.  Hypertension is treated with lifestyle changes, medicines, or a combination of both. Lifestyle changes  include losing weight, eating a healthy, low-sodium diet, exercising more, and limiting alcohol. This information is not intended to replace advice given to you by your health care provider. Make sure you discuss any questions you have with your health care provider. Document Revised: 05/14/2018 Document Reviewed: 05/14/2018 Elsevier Patient Education  2020 [employer].

## 2020-04-14 NOTE — Assessment & Plan Note (Signed)
minimize simple carbs. Increase exercise as tolerated.  

## 2020-04-14 NOTE — Assessment & Plan Note (Signed)
Encouraged heart healthy diet, increase exercise, avoid trans fats, consider a krill oil cap daily 

## 2020-04-15 DIAGNOSIS — R296 Repeated falls: Secondary | ICD-10-CM | POA: Diagnosis not present

## 2020-04-15 DIAGNOSIS — M545 Low back pain: Secondary | ICD-10-CM | POA: Diagnosis not present

## 2020-04-15 DIAGNOSIS — G20 Parkinson's disease: Secondary | ICD-10-CM | POA: Diagnosis not present

## 2020-04-15 DIAGNOSIS — M79645 Pain in left finger(s): Secondary | ICD-10-CM | POA: Insufficient documentation

## 2020-04-15 NOTE — Assessment & Plan Note (Signed)
Well controlled, no changes to meds. Encouraged heart healthy diet such as the DASH diet and exercise as tolerated.  °

## 2020-04-15 NOTE — Progress Notes (Signed)
Subjective:    Patient ID: Paul Drake, male    DOB: Feb 01, 1946, 74 y.o.   MRN: [id number]  Chief Complaint  Patient presents with  . Follow-up    HPI Patient is in today for follow up on chronic medical concerns. No recent febrile illness or hospitalizations. His tremor is improved on current meds. He is following with [facility] and he is doing well most days. He did injure his left 5th finger about 4 months ago and while it is better it is still sore and swollen at the PIP joint. Denies CP/palp/SOB/HA/congestion/fevers/GI or GU c/o. Taking meds as prescribed  Past Medical History:  Diagnosis Date  . Allergic state 12/29/2013  . Arthritis   . Barrett's esophagus   . BPH (benign prostatic hyperplasia)   . Chicken pox   . CVA (cerebral infarction)   . Depression   . GERD (gastroesophageal reflux disease)   . History of kidney stones   . HTN (hypertension), benign 04/01/2015  . Hyperglycemia 01/06/2015  . Hyperlipemia   . Hypertension   . IBS (irritable bowel syndrome)   . Left hand pain 07/21/2017  . Measles as a child  . Medicare annual wellness visit, subsequent 06/26/2015  . Mumps as a child  . Otitis externa 12/10/2013  . Seasonal allergies    some asthma  . Stroke ([place])   . Unspecified asthma(493.90) 12/29/2013    Past Surgical History:  Procedure Laterality Date  . COLONOSCOPY    . EP IMPLANTABLE DEVICE N/A 10/16/2016   Procedure: Loop Recorder Insertion;  Surgeon: Thompson Grayer, MD;  Location: [facility];  Service: Cardiovascular;  Laterality: N/A;  . LITHOTRIPSY     multiple times  . PROSTATE SURGERY  08/2017  . TEE WITHOUT CARDIOVERSION N/A 09/22/2014   Procedure: TRANSESOPHAGEAL ECHOCARDIOGRAM (TEE);  Surgeon: Josue Hector, MD;  [facility];  Service: Cardiovascular;  Laterality: N/A;  . WISDOM TOOTH EXTRACTION      Family History  Problem Relation Age of Onset  . Hypertension Mother   . Hyperlipidemia Mother   . Fibromyalgia Mother     . Arthritis Mother        rheumatoid  . Diabetes Sister        type 2  . Hyperlipidemia Brother   . Hypertension Brother   . Prostate cancer Brother   . Ulcers Father 36       Bleeding Ulcers  . Kidney Stones Daughter   . Asthma Daughter   . Healthy Son   . Cancer Maternal Grandfather        skin ?  . Stroke Maternal Aunt   . Cancer Maternal Uncle        prostate    Social History   Socioeconomic History  . Marital status: Married    Spouse name: Webb Silversmith  . Number of children: 3  . Years of education: college  . Highest education level: Master's degree (e.g., MA, MS, MEng, MEd, MSW, MBA)  Occupational History  . Occupation: retired    Comment: retired  Tobacco Use  . Smoking status: Never Smoker  . Smokeless tobacco: Never Used  Vaping Use  . Vaping Use: Never used  Substance and Sexual Activity  . Alcohol use: Yes    Alcohol/week: 1.0 standard drink    Types: 1 Shots of liquor per week    Comment: 1-2 old fashions per evening  . Drug use: No  . Sexual activity: Yes    Comment: lives with  wife, Training and development officer, retired, avoids dairy, minimizes gluten  Other Topics Concern  . Not on file  Social History Narrative   Patient consumes 3 cups of caffeine daily   Left handed   Lives at home with his wife. They are living in their daughter's house to help to help take care of their granddaughter.      Patient uses a home gym 3-4 x a week.   Social Determinants of Health   Financial Resource Strain:   . Difficulty of Paying Living Expenses:   Food Insecurity:   . Worried About Charity fundraiser in the Last Year:   . Arboriculturist in the Last Year:   Transportation Needs:   . Film/video editor (Medical):   Marland Kitchen Lack of Transportation (Non-Medical):   Physical Activity:   . Days of Exercise per Week:   . Minutes of Exercise per Session:   Stress:   . Feeling of Stress :   Social Connections:   . Frequency of Communication with Friends and Family:   . Frequency  of Social Gatherings with Friends and Family:   . Attends Religious Services:   . Active Member of Clubs or Organizations:   . Attends Archivist Meetings:   Marland Kitchen Marital Status:   Intimate Partner Violence:   . Fear of Current or Ex-Partner:   . Emotionally Abused:   Marland Kitchen Physically Abused:   . Sexually Abused:     Outpatient Medications Prior to Visit  Medication Sig Dispense Refill  . albuterol (PROVENTIL HFA;VENTOLIN HFA) 108 (90 Base) MCG/ACT inhaler Inhale 2 puffs into the lungs every 6 (six) hours as needed for wheezing or shortness of breath. 1 Inhaler 3  . alfuzosin (UROXATRAL) 10 MG 24 hr tablet Take 10 mg by mouth daily with breakfast.    . ALPRAZolam (XANAX) 0.25 MG tablet Take 1 tablet (0.25 mg total) by mouth 2 (two) times daily as needed for anxiety. 20 tablet 0  . carbidopa-levodopa (SINEMET IR) 25-100 MG tablet Take 1 tablet by mouth 3 (three) times daily. 90 tablet 3  . cetirizine (ZYRTEC) 10 MG tablet Take 10 mg by mouth daily.    . clonazePAM (KLONOPIN) 0.5 MG tablet Take 0.5 tablets (0.25 mg total) by mouth at bedtime. 45 tablet 1  . clopidogrel (PLAVIX) 75 MG tablet TAKE 1 TABLET(75 MG) BY MOUTH DAILY 90 tablet 3  . fluticasone (FLONASE) 50 MCG/ACT nasal spray SHAKE LIQUID AND USE 2 SPRAYS IN EACH NOSTRIL DAILY 16 g 6  . gabapentin (NEURONTIN) 100 MG capsule TAKE 2 CAPSULES(200 MG) BY MOUTH THREE TIMES DAILY (Patient taking differently: TAKE 2 CAPSULES(200 MG) BY MOUTH THREE TIMES DAILY Patient take 1 tablet q 4-6 hours) 540 capsule 1  . Menthol, Topical Analgesic, (BIOFREEZE EX) Apply 1 application topically daily as needed (hand pain).    . Multiple Vitamins-Minerals (MULTIVITAMINS THER. W/MINERALS) TABS tablet Take 1 tablet by mouth daily.    . NONFORMULARY OR COMPOUNDED ITEM Neuropathic cream - apply 1 to 2 g to left hand 3-4 times daily as needed 1 each 3  . Omega-3 Fatty Acids (OMEGA-3 PO) Take 350 mg by mouth daily.     . pantoprazole (PROTONIX) 40 MG  tablet TAKE 1 TABLET(40 MG) BY MOUTH DAILY 90 tablet 1  . simvastatin (ZOCOR) 20 MG tablet TAKE 1 TABLET BY MOUTH DAILY 90 tablet 1  . venlafaxine XR (EFFEXOR-XR) 150 MG 24 hr capsule Take 1 capsule (150 mg total) by mouth at bedtime.  90 capsule 0  . HYDROcodone-acetaminophen (NORCO/VICODIN) 5-325 MG tablet Take 1 tablet by mouth every 6 (six) hours as needed for moderate pain.     No facility-administered medications prior to visit.    Allergies  Allergen Reactions  . Molds & Smuts Other (See Comments)  . Pollen Extract Other (See Comments)    Runny nose,itchy eyes (trees, grasses, etc)     Review of Systems  Constitutional: Negative for fever and malaise/fatigue.  HENT: Negative for congestion.   Eyes: Negative for blurred vision.  Respiratory: Negative for shortness of breath.   Cardiovascular: Negative for chest pain, palpitations and leg swelling.  Gastrointestinal: Negative for abdominal pain, blood in stool and nausea.  Genitourinary: Negative for dysuria and frequency.  Musculoskeletal: Positive for joint pain. Negative for falls.  Skin: Negative for rash.  Neurological: Positive for tremors. Negative for dizziness, loss of consciousness and headaches.  Endo/Heme/Allergies: Negative for environmental allergies.  Psychiatric/Behavioral: Negative for depression. The patient is nervous/anxious.        Objective:    Physical Exam Vitals and nursing note reviewed.  Constitutional:      General: He is not in acute distress.    Appearance: He is well-developed.  HENT:     Head: Normocephalic and atraumatic.     Nose: Nose normal.  Eyes:     General:        Right eye: No discharge.        Left eye: No discharge.  Cardiovascular:     Rate and Rhythm: Normal rate and regular rhythm.     Heart sounds: No murmur heard.   Pulmonary:     Effort: Pulmonary effort is normal.     Breath sounds: Normal breath sounds.  Abdominal:     General: Bowel sounds are normal.      Palpations: Abdomen is soft.     Tenderness: There is no abdominal tenderness.  Musculoskeletal:     Cervical back: Normal range of motion and neck supple.  Skin:    General: Skin is warm and dry.  Neurological:     Mental Status: He is alert and oriented to person, place, and time.     BP 120/80 (BP Location: Right Arm, Cuff Size: Normal)   Pulse 69   Temp 97.8 F (36.6 C) (Oral)   Resp 12   Ht 5\' 8"  (1.727 m)   Wt 170 lb 12.8 oz (77.5 kg)   SpO2 97%   BMI 25.97 kg/m  Wt Readings from Last 3 Encounters:  04/14/20 170 lb 12.8 oz (77.5 kg)  03/28/20 176 lb 4 oz (79.9 kg)  03/01/20 177 lb (80.3 kg)    Diabetic Foot Exam - Simple   No data filed     Lab Results  Component Value Date   WBC 5.0 02/08/2020   HGB 12.8 (L) 02/08/2020   HCT 37.8 (L) 02/08/2020   PLT 158.0 02/08/2020   GLUCOSE 94 02/08/2020   CHOL 155 02/08/2020   TRIG 102.0 02/08/2020   HDL 59.70 02/08/2020   LDLDIRECT 76.0 12/31/2014   LDLCALC 75 02/08/2020   ALT 7 02/08/2020   AST 16 02/08/2020   NA 142 02/08/2020   K 4.1 02/08/2020   CL 104 02/08/2020   CREATININE 1.03 02/08/2020   BUN 16 02/08/2020   CO2 31 02/08/2020   TSH 1.69 02/08/2020   INR 0.98 09/20/2014   HGBA1C 5.5 09/28/2019    Lab Results  Component Value Date   TSH 1.69 02/08/2020  Lab Results  Component Value Date   WBC 5.0 02/08/2020   HGB 12.8 (L) 02/08/2020   HCT 37.8 (L) 02/08/2020   MCV 98.1 02/08/2020   PLT 158.0 02/08/2020   Lab Results  Component Value Date   NA 142 02/08/2020   K 4.1 02/08/2020   CO2 31 02/08/2020   GLUCOSE 94 02/08/2020   BUN 16 02/08/2020   CREATININE 1.03 02/08/2020   BILITOT 0.7 02/08/2020   ALKPHOS 60 02/08/2020   AST 16 02/08/2020   ALT 7 02/08/2020   PROT 6.2 02/08/2020   ALBUMIN 4.1 02/08/2020   CALCIUM 9.1 02/08/2020   ANIONGAP 7 09/14/2016   GFR 70.27 02/08/2020   Lab Results  Component Value Date   CHOL 155 02/08/2020   Lab Results  Component Value Date   HDL  59.70 02/08/2020   Lab Results  Component Value Date   LDLCALC 75 02/08/2020   Lab Results  Component Value Date   TRIG 102.0 02/08/2020   Lab Results  Component Value Date   CHOLHDL 3 02/08/2020   Lab Results  Component Value Date   HGBA1C 5.5 09/28/2019       Assessment & Plan:   Problem List Items Addressed This Visit    Essential hypertension    Well controlled, no changes to meds. Encouraged heart healthy diet such as the DASH diet and exercise as tolerated.       Hyperglycemia    minimize simple carbs. Increase exercise as tolerated.       HLD (hyperlipidemia)    Encouraged heart healthy diet, increase exercise, avoid trans fats, consider a krill oil cap daily      Parkinson's disease ([place])    He is doing much better on his current meds. Is following with [facility]      Finger pain, left - Primary    He injured his 5th finger on his left hand about 4 months ago and it feels better but he still has a swollen lesion on the lateral aspect of his first IP joint. Xray is only shows soft tissue swelling. Encouraged to apply Lidocaine bid      Relevant Orders   DG Finger Ring Left (Completed)      I have discontinued [place] HYDROcodone-acetaminophen. I am also having him maintain his multivitamins ther. w/minerals, Omega-3 Fatty Acids (OMEGA-3 PO), albuterol, alfuzosin, cetirizine, (Menthol, Topical Analgesic, (BIOFREEZE EX)), ALPRAZolam, NONFORMULARY OR COMPOUNDED ITEM, pantoprazole, simvastatin, gabapentin, carbidopa-levodopa, fluticasone, clonazePAM, clopidogrel, and venlafaxine XR.  No orders of the defined types were placed in this encounter.    Penni Homans, MD

## 2020-04-15 NOTE — Assessment & Plan Note (Signed)
He is doing much better on his current meds. Is following with [facility]

## 2020-04-15 NOTE — Assessment & Plan Note (Signed)
He injured his 5th finger on his left hand about 4 months ago and it feels better but he still has a swollen lesion on the lateral aspect of his first IP joint. Xray is only shows soft tissue swelling. Encouraged to apply Lidocaine bid

## 2020-04-19 DIAGNOSIS — R296 Repeated falls: Secondary | ICD-10-CM | POA: Diagnosis not present

## 2020-04-19 DIAGNOSIS — M545 Low back pain: Secondary | ICD-10-CM | POA: Diagnosis not present

## 2020-04-19 DIAGNOSIS — G20 Parkinson's disease: Secondary | ICD-10-CM | POA: Diagnosis not present

## 2020-04-21 DIAGNOSIS — G20 Parkinson's disease: Secondary | ICD-10-CM | POA: Diagnosis not present

## 2020-04-21 DIAGNOSIS — M545 Low back pain: Secondary | ICD-10-CM | POA: Diagnosis not present

## 2020-04-21 DIAGNOSIS — R296 Repeated falls: Secondary | ICD-10-CM | POA: Diagnosis not present

## 2020-04-22 ENCOUNTER — Other Ambulatory Visit: Payer: Self-pay | Admitting: Neurology

## 2020-04-26 DIAGNOSIS — M545 Low back pain: Secondary | ICD-10-CM | POA: Diagnosis not present

## 2020-04-26 DIAGNOSIS — G20 Parkinson's disease: Secondary | ICD-10-CM | POA: Diagnosis not present

## 2020-04-26 DIAGNOSIS — R296 Repeated falls: Secondary | ICD-10-CM | POA: Diagnosis not present

## 2020-04-29 DIAGNOSIS — R296 Repeated falls: Secondary | ICD-10-CM | POA: Diagnosis not present

## 2020-04-29 DIAGNOSIS — G20 Parkinson's disease: Secondary | ICD-10-CM | POA: Diagnosis not present

## 2020-04-29 DIAGNOSIS — M545 Low back pain: Secondary | ICD-10-CM | POA: Diagnosis not present

## 2020-05-16 DIAGNOSIS — R351 Nocturia: Secondary | ICD-10-CM | POA: Diagnosis not present

## 2020-05-16 DIAGNOSIS — N401 Enlarged prostate with lower urinary tract symptoms: Secondary | ICD-10-CM | POA: Diagnosis not present

## 2020-05-16 DIAGNOSIS — R350 Frequency of micturition: Secondary | ICD-10-CM | POA: Diagnosis not present

## 2020-05-18 DIAGNOSIS — D485 Neoplasm of uncertain behavior of skin: Secondary | ICD-10-CM | POA: Diagnosis not present

## 2020-05-18 DIAGNOSIS — L820 Inflamed seborrheic keratosis: Secondary | ICD-10-CM | POA: Diagnosis not present

## 2020-05-18 NOTE — Progress Notes (Signed)
I connected with Kiyan today by telephone and verified that I am speaking with the correct person using two identifiers. Location patient: home Location provider: work Persons participating in the virtual visit: patient, Marine scientist.    I discussed the limitations, risks, security and privacy concerns of performing an evaluation and management service by telephone and the availability of in person appointments. I also discussed with the patient that there may be a patient responsible charge related to this service. The patient expressed understanding and verbally consented to this telephonic visit.    Interactive audio and video telecommunications were attempted between this provider and patient, however failed, due to patient having technical difficulties OR patient did not have access to video capability.  We continued and completed visit with audio only.  Some vital signs may be absent or patient reported.   Subjective:   Paul Drake is a 74 y.o. male who presents for Medicare Annual/Subsequent preventive examination.  Review of Systems    Cardiac Risk Factors include: advanced age (>60men, >82 women);dyslipidemia;hypertension;male gender     Objective:    Advanced Directives 05/19/2020 03/01/2020 05/18/2019 05/16/2018 10/16/2016 10/15/2016 09/14/2016  Does Patient Have a Medical Advance Directive? Yes Yes Yes Yes Yes Yes Yes  Type of Paramedic of [place];Living will [place];Living will [place];Living will [place];Living will [place];Living will [place];Living will [place];Living will  Does patient want to make changes to medical advance directive? No - Patient declined - No - Patient declined - No - Patient declined - -  Copy of Paul Drake in Chart? No - copy requested - No - copy requested Yes No - copy requested No - copy  requested -    Current Medications (verified) Outpatient Encounter Medications as of 05/19/2020  Medication Sig  . albuterol (PROVENTIL HFA;VENTOLIN HFA) 108 (90 Base) MCG/ACT inhaler Inhale 2 puffs into the lungs every 6 (six) hours as needed for wheezing or shortness of breath.  . alfuzosin (UROXATRAL) 10 MG 24 hr tablet Take 10 mg by mouth daily with breakfast.  . ALPRAZolam (XANAX) 0.25 MG tablet Take 1 tablet (0.25 mg total) by mouth 2 (two) times daily as needed for anxiety.  . carbidopa-levodopa (SINEMET IR) 25-100 MG tablet TAKE 1 TABLET BY MOUTH THREE TIMES DAILY  . cetirizine (ZYRTEC) 10 MG tablet Take 10 mg by mouth daily.  . clonazePAM (KLONOPIN) 0.5 MG tablet Take 0.5 tablets (0.25 mg total) by mouth at bedtime.  . clopidogrel (PLAVIX) 75 MG tablet TAKE 1 TABLET(75 MG) BY MOUTH DAILY  . fluticasone (FLONASE) 50 MCG/ACT nasal spray SHAKE LIQUID AND USE 2 SPRAYS IN EACH NOSTRIL DAILY  . gabapentin (NEURONTIN) 100 MG capsule TAKE 2 CAPSULES(200 MG) BY MOUTH THREE TIMES DAILY (Patient taking differently: TAKE 2 CAPSULES(200 MG) BY MOUTH THREE TIMES DAILY Patient take 1 tablet q 4-6 hours)  . Menthol, Topical Analgesic, (BIOFREEZE EX) Apply 1 application topically daily as needed (hand pain).  . Multiple Vitamins-Minerals (MULTIVITAMINS THER. W/MINERALS) TABS tablet Take 1 tablet by mouth daily.  . NONFORMULARY OR COMPOUNDED ITEM Neuropathic cream - apply 1 to 2 g to left hand 3-4 times daily as needed  . Omega-3 Fatty Acids (OMEGA-3 PO) Take 350 mg by mouth daily.   . pantoprazole (PROTONIX) 40 MG tablet TAKE 1 TABLET(40 MG) BY MOUTH DAILY  . simvastatin (ZOCOR) 20 MG tablet TAKE 1 TABLET BY MOUTH DAILY  . venlafaxine  XR (EFFEXOR-XR) 150 MG 24 hr capsule Take 1 capsule (150 mg total) by mouth at bedtime.   No facility-administered encounter medications on file as of 05/19/2020.    Allergies (verified) Molds & smuts and Pollen extract   History: Past Medical History:  Diagnosis  Date  . Allergic state 12/29/2013  . Arthritis   . Barrett's esophagus   . BPH (benign prostatic hyperplasia)   . Chicken pox   . CVA (cerebral infarction)   . Depression   . GERD (gastroesophageal reflux disease)   . History of kidney stones   . HTN (hypertension), benign 04/01/2015  . Hyperglycemia 01/06/2015  . Hyperlipemia   . Hypertension   . IBS (irritable bowel syndrome)   . Left hand pain 07/21/2017  . Measles as a child  . Medicare annual wellness visit, subsequent 06/26/2015  . Mumps as a child  . Otitis externa 12/10/2013  . Seasonal allergies    some asthma  . Stroke ([place])   . Unspecified asthma(493.90) 12/29/2013   Past Surgical History:  Procedure Laterality Date  . COLONOSCOPY    . EP IMPLANTABLE DEVICE N/A 10/16/2016   Procedure: Loop Recorder Insertion;  Surgeon: Thompson Grayer, MD;  Location: [facility];  Service: Cardiovascular;  Laterality: N/A;  . LITHOTRIPSY     multiple times  . PROSTATE SURGERY  08/2017  . TEE WITHOUT CARDIOVERSION N/A 09/22/2014   Procedure: TRANSESOPHAGEAL ECHOCARDIOGRAM (TEE);  Surgeon: Josue Hector, MD;  Location: [facility];  Service: Cardiovascular;  Laterality: N/A;  . WISDOM TOOTH EXTRACTION     Family History  Problem Relation Age of Onset  . Hypertension Mother   . Hyperlipidemia Mother   . Fibromyalgia Mother   . Arthritis Mother        rheumatoid  . Diabetes Sister        type 2  . Hyperlipidemia Brother   . Hypertension Brother   . Prostate cancer Brother   . Ulcers Father 36       Bleeding Ulcers  . Kidney Stones Daughter   . Asthma Daughter   . Healthy Son   . Cancer Maternal Grandfather        skin ?  . Stroke Maternal Aunt   . Cancer Maternal Uncle        prostate   Social History   Socioeconomic History  . Marital status: Married    Spouse name: Paul Drake  . Number of children: 3  . Years of education: college  . Highest education level: Master's degree (e.g., MA, MS, MEng, MEd, MSW, MBA)    Occupational History  . Occupation: retired    Comment: retired  Tobacco Use  . Smoking status: Never Smoker  . Smokeless tobacco: Never Used  Vaping Use  . Vaping Use: Never used  Substance and Sexual Activity  . Alcohol use: Not Currently    Alcohol/week: 1.0 standard drink    Types: 1 Shots of liquor per week  . Drug use: No  . 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 Not on file  Social History Narrative   Patient consumes 3 cups of caffeine daily   Left handed   Lives at home with his wife. They are living in their daughter's house to help to help take care of their granddaughter.      Patient uses a home gym 3-4 x a week.   Social Determinants of  Health   Financial Resource Strain: Low Risk   . Difficulty of Paying Living Expenses: Not hard at all  Food Insecurity: No Food Insecurity  . Worried About Charity fundraiser in the Last Year: Never true  . Ran Out of Food in the Last Year: Never true  Transportation Needs: No Transportation Needs  . Lack of Transportation (Medical): No  . Lack of Transportation (Non-Medical): No  Physical Activity:   . Days of Exercise per Week: Not on file  . Minutes of Exercise per Session: Not on file  Stress:   . Feeling of Stress : Not on file  Social Connections:   . Frequency of Communication with Friends and Family: Not on file  . Frequency of Social Gatherings with Friends and Family: Not on file  . Attends Religious Services: Not on file  . Active Member of Clubs or Organizations: Not on file  . Attends Archivist Meetings: Not on file  . Marital Status: Not on file    Tobacco Counseling Counseling given: Not Answered   Clinical Intake: Pain : No/denies pain    Activities of Daily Living In your present state of health, do you have any difficulty performing the following activities: 05/19/2020  Hearing? N  Vision? N   Difficulty concentrating or making decisions? N  Walking or climbing stairs? N  Dressing or bathing? N  Doing errands, shopping? N  Preparing Food and eating ? N  Using the Toilet? N  In the past six months, have you accidently leaked urine? N  Do you have problems with loss of bowel control? N  Managing your Medications? N  Managing your Finances? N  Housekeeping or managing your Housekeeping? N  Some recent data might be hidden    Patient Care Team: Mosie Lukes, MD as PCP - General (Family Medicine) Rosalin Hawking, MD as Consulting Physician (Neurology) Ladene Artist, MD as Consulting Physician (Gastroenterology) Sheryn Bison, MD as Referring Physician (Dermatology) Robley Fries, MD as Consulting Physician (Urology)  Indicate any recent Medical Services you may have received from other than [facility] providers in the past year (date may be approximate).     Assessment:   This is a routine wellness examination for [place].  Dietary issues and exercise activities discussed: Current Exercise Habits: Home exercise routine, Type of exercise: walking, Time (Minutes): 45, Frequency (Times/Week): 5, Weekly Exercise (Minutes/Week): 225, Intensity: Mild, Exercise limited by: None identified Diet (meal preparation, eat out, water intake, caffeinated beverages, dairy products, fruits and vegetables): well balanced  Goals    . Maintain healthy lifestyle.      Depression Screen PHQ 2/9 Scores 05/19/2020 03/01/2020 05/18/2019 05/16/2018 01/14/2018 10/15/2016 02/03/2016  PHQ - 2 Score 1 4 1  0 0 0 0  PHQ- 9 Score - 12 - - - - -    Fall Risk Fall Risk  05/19/2020 03/01/2020 08/12/2019 05/18/2019 09/04/2018  Falls in the past year? 0 1 1 1 1   Comment - - Emmi Telephone Survey: data to providers prior to load - -  Number falls in past yr: 0 1 1 1  0  Comment - - Emmi Telephone Survey Actual Response = 4 - -  Injury with Fall? 0 0 0 0 0  Risk Factor Category  - - - - -  Risk for fall due to  : - - - Impaired balance/gait -  Risk for fall due to: Comment - - - - -  Follow up Education provided;Falls prevention  discussed - - Education provided;Falls prevention discussed -    Any stairs in or around the home? Yes  If so, are there any without handrails? No  Home free of loose throw rugs in walkways, pet beds, electrical cords, etc? Yes  Adequate lighting in your home to reduce risk of falls? Yes   ASSISTIVE DEVICES UTILIZED TO PREVENT FALLS:  Life alert? No  Use of a cane, walker or w/c? No  Grab bars in the bathroom? Yes  Shower chair or bench in shower? No  Elevated toilet seat or a handicapped toilet? No     Cognitive Function: Ad8 score reviewed for issues:  Issues making decisions:no  Less interest in hobbies / activities:no  Repeats questions, stories (family complaining):no  Trouble using ordinary gadgets (microwave, computer, phone):no  Forgets the month or year: no  Mismanaging finances: no  Remembering appts:no  Daily problems with thinking and/or memory:no Ad8 score is=0     MMSE - Mini Mental State Exam 10/15/2016  Orientation to time 5  Orientation to Place 5  Registration 3  Attention/ Calculation 5  Recall 3  Language- name 2 objects 2  Language- repeat 1  Language- follow 3 step command 3  Language- read & follow direction 1  Write a sentence 1  Copy design 1  Total score 30        Immunizations Immunization History  Administered Date(s) Administered  . Fluad Quad(high Dose 65+) 06/17/2019  . Influenza, High Dose Seasonal PF 06/23/2017, 07/01/2018  . Influenza,inj,Quad PF,6+ Mos 05/17/2014, 05/20/2015, 07/05/2016  . Influenza-Unspecified 06/17/2013  . PFIZER SARS-COV-2 Vaccination 10/02/2019, 10/23/2019  . Pneumococcal Conjugate-13 10/15/2016  . Pneumococcal-Unspecified 06/17/2013  . Tdap 11/11/2013    TDAP status: Up to date Flu Vaccine status: Up to date Pneumococcal vaccine status: Up to date Covid-19 vaccine  status: Completed vaccines    Screening Tests Health Maintenance  Topic Date Due  . INFLUENZA VACCINE  04/17/2020  . COLONOSCOPY  10/01/2023  . TETANUS/TDAP  11/12/2023  . COVID-19 Vaccine  Completed  . Hepatitis C Screening  Completed  . PNA vac Low Risk Adult  Completed    Health Maintenance  Health Maintenance Due  Topic Date Due  . INFLUENZA VACCINE  04/17/2020    CCS- last pt reported 09/30/13-normal   Additional Screening:  Hepatitis C Screening: does qualify; Completed 10/20/15  Vision Screening: Recommended annual ophthalmology exams for early detection of glaucoma and other disorders of the eye. Is the patient up to date with their annual eye exam?  Yes    Dental Screening: Recommended annual dental exams for proper oral hygiene  Community Resource Referral / Chronic Care Management: CRR required this visit?  No   CCM required this visit?  No      Plan:    Continue to eat heart healthy diet (full of fruits, vegetables, whole grains, lean protein, water--limit salt, fat, and sugar intake) and increase physical activity as tolerated.  Continue doing brain stimulating activities (puzzles, reading, adult coloring books, staying active) to keep memory sharp.    I have personally reviewed and noted the following in the patient's chart:   . Medical and social history . Use of alcohol, tobacco or illicit drugs  . Current medications and supplements . Functional ability and status . Nutritional status . Physical activity . Advanced directives . List of other physicians . Hospitalizations, surgeries, and ER visits in previous 12 months . Vitals . Screenings to include cognitive, depression, and falls . Referrals and  appointments  In addition, I have reviewed and discussed with patient certain preventive protocols, quality metrics, and best practice recommendations. A written personalized care plan for preventive services as well as general preventive health  recommendations were provided to patient.   Due to this being a telephonic visit, the after visit summary with patients personalized plan was offered to patient via mail or my-chart.Patient declined at this time.  Naaman Plummer [place], [place]   05/19/2020

## 2020-05-19 ENCOUNTER — Other Ambulatory Visit: Payer: Self-pay

## 2020-05-19 ENCOUNTER — Encounter: Payer: Self-pay | Admitting: *Deleted

## 2020-05-19 ENCOUNTER — Ambulatory Visit (INDEPENDENT_AMBULATORY_CARE_PROVIDER_SITE_OTHER): Payer: Medicare Other | Admitting: *Deleted

## 2020-05-19 DIAGNOSIS — Z0000 Encounter for general adult medical examination without abnormal findings: Secondary | ICD-10-CM

## 2020-05-19 NOTE — Patient Instructions (Signed)
Continue to eat heart healthy diet (full of fruits, vegetables, whole grains, lean protein, water--limit salt, fat, and sugar intake) and increase physical activity as tolerated.  Continue doing brain stimulating activities (puzzles, reading, adult coloring books, staying active) to keep memory sharp.    Paul Drake , Thank you for taking time to come for your Medicare Wellness Visit. I appreciate your ongoing commitment to your health goals. Please review the following plan we discussed and let me know if I can assist you in the future.   These are the goals we discussed: Goals    . Maintain healthy lifestyle.       This is a list of the screening recommended for you and due dates:  Health Maintenance  Topic Date Due  . Flu Shot  04/17/2020  . Colon Cancer Screening  10/01/2023  . Tetanus Vaccine  11/12/2023  . COVID-19 Vaccine  Completed  .  Hepatitis C: One time screening is recommended by Center for Disease Control  (CDC) for  adults born from [birth year] through 1967.   Completed  . Pneumonia vaccines  Completed    Preventive Care 38 Years and Older, Male Preventive care refers to lifestyle choices and visits with your health care provider that can promote health and wellness. This includes:  A yearly physical exam. This is also called an annual well check.  Regular dental and eye exams.  Immunizations.  Screening for certain conditions.  Healthy lifestyle choices, such as diet and exercise. What can I expect for my preventive care visit? Physical exam Your health care provider will check:  Height and weight. These may be used to calculate body mass index (BMI), which is a measurement that tells if you are at a healthy weight.  Heart rate and blood pressure.  Your skin for abnormal spots. Counseling Your health care provider may ask you questions about:  Alcohol, tobacco, and drug use.  Emotional well-being.  Home and relationship well-being.  Sexual  activity.  Eating habits.  History of falls.  Memory and ability to understand (cognition).  Work and work Statistician. What immunizations do I need?  Influenza (flu) vaccine  This is recommended every year. Tetanus, diphtheria, and pertussis (Tdap) vaccine  You may need a Td booster every 10 years. Varicella (chickenpox) vaccine  You may need this vaccine if you have not already been vaccinated. Zoster (shingles) vaccine  You may need this after age 32. Pneumococcal conjugate (PCV13) vaccine  One dose is recommended after age 20. Pneumococcal polysaccharide (PPSV23) vaccine  One dose is recommended after age 68. Measles, mumps, and rubella (MMR) vaccine  You may need at least one dose of MMR if you were born in 1957 or later. You may also need a second dose. Meningococcal conjugate (MenACWY) vaccine  You may need this if you have certain conditions. Hepatitis A vaccine  You may need this if you have certain conditions or if you travel or work in places where you may be exposed to hepatitis A. Hepatitis B vaccine  You may need this if you have certain conditions or if you travel or work in places where you may be exposed to hepatitis B. Haemophilus influenzae type b (Hib) vaccine  You may need this if you have certain conditions. You may receive vaccines as individual doses or as more than one vaccine together in one shot (combination vaccines). Talk with your health care provider about the risks and benefits of combination vaccines. What tests do I need? Blood tests  Lipid and cholesterol levels. These may be checked every 5 years, or more frequently depending on your overall health.  Hepatitis C test.  Hepatitis B test. Screening  Lung cancer screening. You may have this screening every year starting at age 45 if you have a 30-pack-year history of smoking and currently smoke or have quit within the past 15 years.  Colorectal cancer screening. All adults  should have this screening starting at age 56 and continuing until age 36. Your health care provider may recommend screening at age 59 if you are at increased risk. You will have tests every 1-10 years, depending on your results and the type of screening test.  Prostate cancer screening. Recommendations will vary depending on your family history and other risks.  Diabetes screening. This is done by checking your blood sugar (glucose) after you have not eaten for a while (fasting). You may have this done every 1-3 years.  Abdominal aortic aneurysm (AAA) screening. You may need this if you are a current or former smoker.  Sexually transmitted disease (STD) testing. Follow these instructions at home: Eating and drinking  Eat a diet that includes fresh fruits and vegetables, whole grains, lean protein, and low-fat dairy products. Limit your intake of foods with high amounts of sugar, saturated fats, and salt.  Take vitamin and mineral supplements as recommended by your health care provider.  Do not drink alcohol if your health care provider tells you not to drink.  If you drink alcohol: ? Limit how much you have to 0-2 drinks a day. ? Be aware of how much alcohol is in your drink. In the [place] one drink equals one 12 oz bottle of beer (355 mL), one 5 oz glass of wine (148 mL), or one 1 oz glass of hard liquor (44 mL). Lifestyle  Take daily care of your teeth and gums.  Stay active. Exercise for at least 30 minutes on 5 or more days each week.  Do not use any products that contain nicotine or tobacco, such as cigarettes, e-cigarettes, and chewing tobacco. If you need help quitting, ask your health care provider.  If you are sexually active, practice safe sex. Use a condom or other form of protection to prevent STIs (sexually transmitted infections).  Talk with your health care provider about taking a low-dose aspirin or statin. What's next?  Visit your health care provider once a year  for a well check visit.  Ask your health care provider how often you should have your eyes and teeth checked.  Stay up to date on all vaccines. This information is not intended to replace advice given to you by your health care provider. Make sure you discuss any questions you have with your health care provider. Document Revised: 08/28/2018 Document Reviewed: 08/28/2018 Elsevier Patient Education  2020 [employer].

## 2020-05-24 ENCOUNTER — Telehealth: Payer: Self-pay | Admitting: Family Medicine

## 2020-05-24 NOTE — Telephone Encounter (Signed)
I believe he would benefit from a booster COVID shots. Recommend 3 rd shot 6-8 months after last shot. So far CDC has not endorsed everyone over 65 should get a booster just severely immune compromised but I do believe he is immune compromised some and would benefit. It is reasonable for him to get the booster

## 2020-05-24 NOTE — Telephone Encounter (Signed)
Patient states he would like to know if he is suppose to take covid booster injection??  Please advise

## 2020-05-24 NOTE — Telephone Encounter (Signed)
Last covid vaccine done 10/23/19

## 2020-05-25 NOTE — Telephone Encounter (Signed)
Patient has been notified and will get 3rd. Booster vaccine at [facility].

## 2020-05-26 DIAGNOSIS — Z23 Encounter for immunization: Secondary | ICD-10-CM | POA: Diagnosis not present

## 2020-05-30 DIAGNOSIS — R350 Frequency of micturition: Secondary | ICD-10-CM | POA: Diagnosis not present

## 2020-05-30 DIAGNOSIS — N401 Enlarged prostate with lower urinary tract symptoms: Secondary | ICD-10-CM | POA: Diagnosis not present

## 2020-06-17 ENCOUNTER — Other Ambulatory Visit: Payer: Self-pay

## 2020-06-17 ENCOUNTER — Encounter (HOSPITAL_BASED_OUTPATIENT_CLINIC_OR_DEPARTMENT_OTHER): Payer: Self-pay

## 2020-06-17 DIAGNOSIS — J45909 Unspecified asthma, uncomplicated: Secondary | ICD-10-CM | POA: Insufficient documentation

## 2020-06-17 DIAGNOSIS — Z79899 Other long term (current) drug therapy: Secondary | ICD-10-CM | POA: Insufficient documentation

## 2020-06-17 DIAGNOSIS — N201 Calculus of ureter: Secondary | ICD-10-CM | POA: Insufficient documentation

## 2020-06-17 DIAGNOSIS — N132 Hydronephrosis with renal and ureteral calculous obstruction: Secondary | ICD-10-CM | POA: Diagnosis not present

## 2020-06-17 DIAGNOSIS — N134 Hydroureter: Secondary | ICD-10-CM | POA: Diagnosis not present

## 2020-06-17 DIAGNOSIS — Z7951 Long term (current) use of inhaled steroids: Secondary | ICD-10-CM | POA: Diagnosis not present

## 2020-06-17 DIAGNOSIS — I10 Essential (primary) hypertension: Secondary | ICD-10-CM | POA: Diagnosis not present

## 2020-06-17 DIAGNOSIS — I700 Atherosclerosis of aorta: Secondary | ICD-10-CM | POA: Diagnosis not present

## 2020-06-17 DIAGNOSIS — R109 Unspecified abdominal pain: Secondary | ICD-10-CM | POA: Diagnosis present

## 2020-06-17 MED ORDER — FENTANYL CITRATE (PF) 100 MCG/2ML IJ SOLN
50.0000 ug | INTRAMUSCULAR | Status: DC | PRN
  Administered 2020-06-17: 50 ug via NASAL
  Filled 2020-06-17: qty 2

## 2020-06-17 NOTE — ED Triage Notes (Signed)
Pt c/o R flank pain that started today. Pt has hx of frequent kidney stone. Pt called his urologist today and they called in a Rx for pain and nausea medication. Pt states the medication is not helping.

## 2020-06-18 ENCOUNTER — Emergency Department (HOSPITAL_BASED_OUTPATIENT_CLINIC_OR_DEPARTMENT_OTHER)
Admission: EM | Admit: 2020-06-18 | Discharge: 2020-06-18 | Disposition: A | Discharge status: Home / Self Care | Payer: Medicare Other | Attending: Emergency Medicine | Admitting: Emergency Medicine

## 2020-06-18 ENCOUNTER — Emergency Department (HOSPITAL_BASED_OUTPATIENT_CLINIC_OR_DEPARTMENT_OTHER): Payer: Medicare Other

## 2020-06-18 DIAGNOSIS — N134 Hydroureter: Secondary | ICD-10-CM | POA: Diagnosis not present

## 2020-06-18 DIAGNOSIS — I700 Atherosclerosis of aorta: Secondary | ICD-10-CM | POA: Diagnosis not present

## 2020-06-18 DIAGNOSIS — N201 Calculus of ureter: Secondary | ICD-10-CM | POA: Diagnosis not present

## 2020-06-18 DIAGNOSIS — N132 Hydronephrosis with renal and ureteral calculous obstruction: Secondary | ICD-10-CM | POA: Diagnosis not present

## 2020-06-18 LAB — URINALYSIS, ROUTINE W REFLEX MICROSCOPIC
Bilirubin Urine: NEGATIVE
Glucose, UA: NEGATIVE mg/dL
Ketones, ur: NEGATIVE mg/dL
Nitrite: NEGATIVE
Protein, ur: NEGATIVE mg/dL
Specific Gravity, Urine: 1.025 (ref 1.005–1.030)
pH: 5.5 (ref 5.0–8.0)

## 2020-06-18 LAB — URINALYSIS, MICROSCOPIC (REFLEX): RBC / HPF: >50 RBC/hpf (ref 0–5)

## 2020-06-18 MED ORDER — HYDROMORPHONE HCL 1 MG/ML IJ SOLN
1.0000 mg | Freq: Once | INTRAMUSCULAR | Status: AC
  Administered 2020-06-18: 1 mg via INTRAMUSCULAR
  Filled 2020-06-18: qty 1

## 2020-06-18 NOTE — ED Provider Notes (Signed)
[facility] Provider Note: Georgena Spurling, MD, FACEP  CSN: [id number] MRN: [id number] ARRIVAL: 06/17/20 at [place]: [place]  Flank Pain   HISTORY OF PRESENT ILLNESS  06/18/20 1:11 AM Paul Drake is a 74 y.o. male history of multiple kidney stones.  He is here with right flank pain that began yesterday.  He called his urologist yesterday and they prescribed oxycodone and Zofran.  He did not get relief with 5 mg of oxycodone and Zofran was not very effective at relieving his nausea although he denies nausea presently.  He rates his pain as a 7 out of 10 and characterizes it as like previous kidney stones.  It originated in the right flank but is now moved to the right lower quadrant radiating into the right groin.  It is somewhat worse with palpation of the right flank.   Past Medical History:  Diagnosis Date  . Allergic state 12/29/2013  . Arthritis   . Barrett's esophagus   . BPH (benign prostatic hyperplasia)   . Chicken pox   . CVA (cerebral infarction)   . Depression   . GERD (gastroesophageal reflux disease)   . History of kidney stones   . HTN (hypertension), benign 04/01/2015  . Hyperglycemia 01/06/2015  . Hyperlipemia   . Hypertension   . IBS (irritable bowel syndrome)   . Left hand pain 07/21/2017  . Measles as a child  . Medicare annual wellness visit, subsequent 06/26/2015  . Mumps as a child  . Otitis externa 12/10/2013  . Seasonal allergies    some asthma  . Stroke ([place])   . Unspecified asthma(493.90) 12/29/2013    Past Surgical History:  Procedure Laterality Date  . COLONOSCOPY    . EP IMPLANTABLE DEVICE N/A 10/16/2016   Procedure: Loop Recorder Insertion;  Surgeon: Thompson Grayer, MD;  Location: [facility];  Service: Cardiovascular;  Laterality: N/A;  . LITHOTRIPSY     multiple times  . PROSTATE SURGERY  08/2017  . TEE WITHOUT CARDIOVERSION N/A 09/22/2014   Procedure: TRANSESOPHAGEAL ECHOCARDIOGRAM (TEE);  Surgeon:  Josue Hector, MD;  Location: [facility] ENDOSCOPY;  Service: Cardiovascular;  Laterality: N/A;  . WISDOM TOOTH EXTRACTION      Family History  Problem Relation Age of Onset  . Hypertension Mother   . Hyperlipidemia Mother   . Fibromyalgia Mother   . Arthritis Mother        rheumatoid  . Diabetes Sister        type 2  . Hyperlipidemia Brother   . Hypertension Brother   . Prostate cancer Brother   . Ulcers Father 36       Bleeding Ulcers  . Kidney Stones Daughter   . Asthma Daughter   . Healthy Son   . Cancer Maternal Grandfather        skin ?  . Stroke Maternal Aunt   . Cancer Maternal Uncle        prostate    Social History   Tobacco Use  . Smoking status: Never Smoker  . Smokeless tobacco: Never Used  Vaping Use  . Vaping Use: Never used  Substance Use Topics  . Alcohol use: Not Currently    Alcohol/week: 1.0 standard drink    Types: 1 Shots of liquor per week  . Drug use: No    Prior to Admission medications   Medication Sig Start Date End Date Taking? Authorizing Provider  albuterol (PROVENTIL HFA;VENTOLIN HFA) 108 (90 Base) MCG/ACT inhaler  Inhale 2 puffs into the lungs every 6 (six) hours as needed for wheezing or shortness of breath. 09/23/18   Mosie Lukes, MD  alfuzosin (UROXATRAL) 10 MG 24 hr tablet Take 10 mg by mouth daily with breakfast.    [provider]  ALPRAZolam (XANAX) 0.25 MG tablet Take 1 tablet (0.25 mg total) by mouth 2 (two) times daily as needed for anxiety. 07/03/19   Mosie Lukes, MD  carbidopa-levodopa (SINEMET IR) 25-100 MG tablet TAKE 1 TABLET BY MOUTH THREE TIMES DAILY 04/26/20   Garvin Fila, MD  cetirizine (ZYRTEC) 10 MG tablet Take 10 mg by mouth daily.    [provider]  clonazePAM (KLONOPIN) 0.5 MG tablet Take 0.5 tablets (0.25 mg total) by mouth at bedtime. 03/01/20   Tat, Eustace Quail, DO  clopidogrel (PLAVIX) 75 MG tablet TAKE 1 TABLET(75 MG) BY MOUTH DAILY 03/29/20   Garvin Fila, MD  fluticasone [facility]) 50  MCG/ACT nasal spray SHAKE LIQUID AND USE 2 SPRAYS IN EACH NOSTRIL DAILY 02/11/20   Mosie Lukes, MD  gabapentin (NEURONTIN) 100 MG capsule TAKE 2 CAPSULES(200 MG) BY MOUTH THREE TIMES DAILY Patient taking differently: TAKE 2 CAPSULES(200 MG) BY MOUTH THREE TIMES DAILY Patient take 1 tablet q 4-6 hours 12/15/19   Garvin Fila, MD  Menthol, Topical Analgesic, (BIOFREEZE EX) Apply 1 application topically daily as needed (hand pain).    [provider]  Multiple Vitamins-Minerals (MULTIVITAMINS THER. W/MINERALS) TABS tablet Take 1 tablet by mouth daily.    [provider]  NONFORMULARY OR COMPOUNDED ITEM Neuropathic cream - apply 1 to 2 g to left hand 3-4 times daily as needed 08/10/19   Frann Rider, NP  Omega-3 Fatty Acids (OMEGA-3 PO) Take 350 mg by mouth daily.     [provider]  pantoprazole (PROTONIX) 40 MG tablet TAKE 1 TABLET(40 MG) BY MOUTH DAILY 09/16/19   Mosie Lukes, MD  simvastatin (ZOCOR) 20 MG tablet TAKE 1 TABLET BY MOUTH DAILY 12/16/19   Mosie Lukes, MD  venlafaxine XR (EFFEXOR-XR) 150 MG 24 hr capsule Take 1 capsule (150 mg total) by mouth at bedtime. 04/11/20   Mosie Lukes, MD    Allergies Molds & smuts and Pollen extract   REVIEW OF SYSTEMS  Negative except as noted here or in the History of Present Illness.   PHYSICAL EXAMINATION  Initial Vital Signs Blood pressure 138/88, pulse 71, temperature 98.1 F (36.7 C), temperature source Oral, resp. rate 20, height 5\' 8"  (1.727 m), weight 78.9 kg, SpO2 98 %.  Examination General: Well-developed, well-nourished male in no acute distress; appearance consistent with age of record HENT: normocephalic; atraumatic Eyes: Normal appearance Neck: supple Heart: regular rate and rhythm Lungs: clear to auscultation bilaterally Abdomen: soft; nondistended; nontender; bowel sounds present GU: Mild right CVA tenderness Extremities: No deformity; full range of motion; pulses  normal Neurologic: Awake, alert and oriented; motor function intact in all extremities and symmetric; no facial droop Skin: Warm and dry Psychiatric: Normal mood and affect   RESULTS  Summary of this visit's results, reviewed and interpreted by myself:   EKG Interpretation  Date/Time:    Ventricular Rate:    PR Interval:    QRS Duration:   QT Interval:    QTC Calculation:   R Axis:     Text Interpretation:        Laboratory Studies: Results for orders placed or performed during the hospital encounter of 06/18/20 (from the past 24  hour(s))  Urinalysis, Routine w reflex microscopic     Status: Abnormal   Collection Time: 06/17/20 11:49 PM  Result Value Ref Range   Color, Urine YELLOW YELLOW   APPearance CLOUDY (A) CLEAR   Specific Gravity, Urine 1.025 1.005 - 1.030   pH 5.5 5.0 - 8.0   Glucose, UA NEGATIVE NEGATIVE mg/dL   Hgb urine dipstick LARGE (A) NEGATIVE   Bilirubin Urine NEGATIVE NEGATIVE   Ketones, ur NEGATIVE NEGATIVE mg/dL   Protein, ur NEGATIVE NEGATIVE mg/dL   Nitrite NEGATIVE NEGATIVE   Leukocytes,Ua SMALL (A) NEGATIVE  Urinalysis, Microscopic (reflex)     Status: Abnormal   Collection Time: 06/17/20 11:49 PM  Result Value Ref Range   RBC / HPF >50 0 - 5 RBC/hpf   WBC, UA 0-5 0 - 5 WBC/hpf   Bacteria, UA MANY (A) NONE SEEN   Squamous Epithelial / LPF 0-5 0 - 5   Ca Oxalate Crys, UA PRESENT    Imaging Studies: CT Renal Stone Study  Result Date: 06/18/2020 CLINICAL DATA:  Flank pain EXAM: CT ABDOMEN AND PELVIS WITHOUT CONTRAST TECHNIQUE: Multidetector CT imaging of the abdomen and pelvis was performed following the standard protocol without IV contrast. COMPARISON:  05/27/2019 FINDINGS: LOWER CHEST: Normal. HEPATOBILIARY: Normal hepatic contours. No intra- or extrahepatic biliary dilatation. The gallbladder is normal. PANCREAS: Normal pancreas. No ductal dilatation or peripancreatic fluid collection. SPLEEN: Normal. ADRENALS/URINARY TRACT: The adrenal glands  are normal. There is a 6 mm stone in the midportion of the right ureter causing mild hydroureteronephrosis. There are multiple nonobstructing right renal calculi measuring up to 9 mm. On the left, there are multiple nonobstructing renal calculi that measure up to 6 mm. Multiple calcifications near the distal left ureter are unchanged aside from the previously seen left ureterovesical stone, which has passed. The urinary bladder is normal for degree of distention STOMACH/BOWEL: There is no hiatal hernia. Normal duodenal course and caliber. No small bowel dilatation or inflammation. No focal colonic abnormality. Normal appendix. VASCULAR/LYMPHATIC: There is calcific atherosclerosis of the abdominal aorta. No lymphadenopathy. REPRODUCTIVE: Normal prostate size with symmetric seminal vesicles. MUSCULOSKELETAL. No bony spinal canal stenosis or focal osseous abnormality. OTHER: None. IMPRESSION: 1. Right-sided obstructive uropathy with 6 mm stone in the midportion of the right ureter causing mild hydroureteronephrosis. 2. Previously seen left ureterovesical junction stone has passed. 3. Multiple bilateral nonobstructing renal calculi. 4.  Aortic atherosclerosis (ICD10-I70.0). Electronically Signed   By: Ulyses Jarred M.D.   On: 06/18/2020 00:54    ED COURSE and MDM  Nursing notes, initial and subsequent vitals signs, including pulse oximetry, reviewed and interpreted by myself.  Vitals:   06/17/20 2343 06/17/20 2344  BP: 138/88   Pulse: 71   Resp: 20   Temp: 98.1 F (36.7 C)   TempSrc: Oral   SpO2: 98%   Weight:  78.9 kg  Height:  5\' 8"  (1.727 m)   Medications  fentaNYL (SUBLIMAZE) injection 50 mcg (50 mcg Nasal Given 06/17/20 2354)  HYDROmorphone (DILAUDID) injection 1 mg (1 mg Intramuscular Given 06/18/20 0150)   2:29 AM Patient feeling better after IM Dilaudid.  He states he has an adequate supply of oxycodone at home.  He is advised he may take 10 mg (2 tablets) instead of 1 if needed.  He has a  urologist with whom he can follow-up.   PROCEDURES  Procedures   ED DIAGNOSES     ICD-10-CM   1. Ureterolithiasis  N20.1  Elizaveta Mattice, MD 06/18/20 0230

## 2020-06-20 DIAGNOSIS — N201 Calculus of ureter: Secondary | ICD-10-CM | POA: Diagnosis not present

## 2020-06-23 ENCOUNTER — Other Ambulatory Visit: Payer: Self-pay

## 2020-06-23 ENCOUNTER — Ambulatory Visit (INDEPENDENT_AMBULATORY_CARE_PROVIDER_SITE_OTHER): Payer: Medicare Other | Admitting: Family Medicine

## 2020-06-23 ENCOUNTER — Encounter: Payer: Self-pay | Admitting: Family Medicine

## 2020-06-23 VITALS — BP 118/68 | HR 73 | Temp 98.1°F | Resp 12 | Ht 68.0 in | Wt 179.2 lb

## 2020-06-23 DIAGNOSIS — K2270 Barrett's esophagus without dysplasia: Secondary | ICD-10-CM

## 2020-06-23 DIAGNOSIS — I63411 Cerebral infarction due to embolism of right middle cerebral artery: Secondary | ICD-10-CM

## 2020-06-23 DIAGNOSIS — R739 Hyperglycemia, unspecified: Secondary | ICD-10-CM

## 2020-06-23 DIAGNOSIS — Z23 Encounter for immunization: Secondary | ICD-10-CM

## 2020-06-23 DIAGNOSIS — N200 Calculus of kidney: Secondary | ICD-10-CM | POA: Diagnosis not present

## 2020-06-23 DIAGNOSIS — R350 Frequency of micturition: Secondary | ICD-10-CM

## 2020-06-23 DIAGNOSIS — E785 Hyperlipidemia, unspecified: Secondary | ICD-10-CM

## 2020-06-23 DIAGNOSIS — G20 Parkinson's disease: Secondary | ICD-10-CM | POA: Diagnosis not present

## 2020-06-23 DIAGNOSIS — M189 Osteoarthritis of first carpometacarpal joint, unspecified: Secondary | ICD-10-CM | POA: Diagnosis not present

## 2020-06-23 DIAGNOSIS — I10 Essential (primary) hypertension: Secondary | ICD-10-CM | POA: Diagnosis not present

## 2020-06-23 MED ORDER — FAMOTIDINE 40 MG PO TABS
40.0000 mg | ORAL_TABLET | Freq: Every evening | ORAL | 5 refills | Status: DC | PRN
Start: 2020-06-23 — End: 2020-12-22

## 2020-06-23 NOTE — Assessment & Plan Note (Signed)
Flared with the N/V that occurred with his recent kidney stone. The N/V is gone but the burning in esophagus and throat. Will add Famotidine 40 mg qhs prn to his Pantoprazole

## 2020-06-23 NOTE — Assessment & Plan Note (Signed)
Well controlled, no changes to meds. Encouraged heart healthy diet such as the DASH diet and exercise as tolerated.  °

## 2020-06-23 NOTE — Assessment & Plan Note (Signed)
hgba1c acceptable, minimize simple carbs. Increase exercise as tolerated.  

## 2020-06-23 NOTE — Patient Instructions (Addendum)
Shingrix is the new shingles shot, 2 shots over 2-6 months at the pharmacy at least 2 weeks after your flu shot  Encouraged increased hydration and fiber in diet. Daily probiotics. If bowels not moving can use MOM 2 tbls po in 4 oz of warm prune juice by mouth every 2-3 days. If no results then repeat in 4 hours with  Dulcolax suppository pr, may repeat again in 4 more hours as needed. Seek care if symptoms worsen. Consider daily Miralax and/or Dulcolax if symptoms persist.   Kidney Stones  Kidney stones are solid, rock-like deposits that form inside of the kidneys. The kidneys are a pair of organs that make urine. A kidney stone may form in a kidney and move into other parts of the urinary tract, including the tubes that connect the kidneys to the bladder (ureters), the bladder, and the tube that carries urine out of the body (urethra). As the stone moves through these areas, it can cause intense pain and block the flow of urine. Kidney stones are created when high levels of certain minerals are found in the urine. The stones are usually passed out of the body through urination, but in some cases, medical treatment may be needed to remove them. What are the causes? Kidney stones may be caused by:  A condition in which certain glands produce too much parathyroid hormone (primary hyperparathyroidism), which causes too much calcium buildup in the blood.  A buildup of uric acid crystals in the bladder (hyperuricosuria). Uric acid is a chemical that the body produces when you eat certain foods. It usually exits the body in the urine.  Narrowing (stricture) of one or both of the ureters.  A kidney blockage that is present at birth (congenital obstruction).  Past surgery on the kidney or the ureters, such as gastric bypass surgery. What increases the risk? The following factors may make you more likely to develop this condition:  Having had a kidney stone in the past.  Having a family history of  kidney stones.  Not drinking enough water.  Eating a diet that is high in protein, salt (sodium), or sugar.  Being overweight or obese. What are the signs or symptoms? Symptoms of a kidney stone may include:  Pain in the side of the abdomen, right below the ribs (flank pain). Pain usually spreads (radiates) to the groin.  Needing to urinate frequently or urgently.  Painful urination.  Blood in the urine (hematuria).  Nausea.  Vomiting.  Fever and chills. How is this diagnosed? This condition may be diagnosed based on:  Your symptoms and medical history.  A physical exam.  Blood tests.  Urine tests. These may be done before and after the stone passes out of your body through urination.  Imaging tests, such as a CT scan, abdominal X-ray, or ultrasound.  A procedure to examine the inside of the bladder (cystoscopy). How is this treated? Treatment for kidney stones depends on the size, location, and makeup of the stones. Kidney stones will often pass out of the body through urination. You may need to:  Increase your fluid intake to help pass the stone. In some cases, you may be given fluids through an IV and may need to be monitored at the hospital.  Take medicine for pain.  Make changes in your diet to help prevent kidney stones from coming back. Sometimes, medical procedures are needed to remove a kidney stone. This may involve:  A procedure to break up kidney stones using: ?  A focused beam of light (laser therapy). ? Shock waves (extracorporeal shock wave lithotripsy).  Surgery to remove kidney stones. This may be needed if you have severe pain or have stones that block your urinary tract. Follow these instructions at home: Medicines  Take over-the-counter and prescription medicines only as told by your health care provider.  Ask your health care provider if the medicine prescribed to you requires you to avoid driving or using heavy machinery. Eating and  drinking  Drink enough fluid to keep your urine pale yellow. You may be instructed to drink at least 8-10 glasses of water each day. This will help you pass the kidney stone.  If directed, change your diet. This may include: ? Limiting how much sodium you eat. ? Eating more fruits and vegetables. ? Limiting how much animal protein--such as red meat, poultry, fish, and eggs--you eat.  Follow instructions from your health care provider about eating or drinking restrictions. General instructions  Collect urine samples as told by your health care provider. You may need to collect a urine sample: ? 24 hours after you pass the stone. ? 8-12 weeks after passing the kidney stone, and every 6-12 months after that.  Strain your urine every time you urinate, for as long as directed. Use the strainer that your health care provider recommends.  Do not throw out the kidney stone after passing it. Keep the stone so it can be tested by your health care provider. Testing the makeup of your kidney stone may help prevent you from getting kidney stones in the future.  Keep all follow-up visits as told by your health care provider. This is important. You may need follow-up X-rays or ultrasounds to make sure that your stone has passed. How is this prevented? To prevent another kidney stone:  Drink enough fluid to keep your urine pale yellow. This is the best way to prevent kidney stones.  Eat a healthy diet and follow recommendations from your health care provider about foods to avoid. You may be instructed to eat a low-protein diet. Recommendations vary depending on the type of kidney stone that you have.  Maintain a healthy weight. Where to find more information  [place] [facility]): www.kidney.[facility]): www.urologyhealth.org Contact a health care provider if:  You have pain that gets worse or does not get better with medicine. Get help right away if:  You  have a fever or chills.  You develop severe pain.  You develop new abdominal pain.  You faint.  You are unable to urinate. Summary  Kidney stones are solid, rock-like deposits that form inside of the kidneys.  Kidney stones can cause nausea, vomiting, blood in the urine, abdominal pain, and the urge to urinate frequently.  Treatment for kidney stones depends on the size, location, and makeup of the stones. Kidney stones will often pass out of the body through urination.  Kidney stones can be prevented by drinking enough fluids, eating a healthy diet, and maintaining a healthy weight. This information is not intended to replace advice given to you by your health care provider. Make sure you discuss any questions you have with your health care provider. Document Revised: 01/20/2019 Document Reviewed: 01/20/2019 Elsevier Patient Education  [place].

## 2020-06-23 NOTE — Assessment & Plan Note (Addendum)
A week ago Friday he began having pain and ended up passing 2 stones over next several days. One was 4 mm and one 7 mm. He feels much better now. Check UA and culture

## 2020-06-23 NOTE — Assessment & Plan Note (Signed)
Stopped Gabapentin last week when he was vomiting. His pain is manageable so for now he does not want to restart but he notes some irritability and anxiety as he has come off of it.

## 2020-06-23 NOTE — Assessment & Plan Note (Addendum)
Encouraged heart healthy diet, increase exercise, avoid trans fats, consider a krill oil cap daily. Tolerating simvastatin 

## 2020-06-23 NOTE — Assessment & Plan Note (Addendum)
His balanced is compromised with his recent illness but he continues to try and stay active

## 2020-06-24 ENCOUNTER — Other Ambulatory Visit: Payer: Self-pay

## 2020-06-24 DIAGNOSIS — R7989 Other specified abnormal findings of blood chemistry: Secondary | ICD-10-CM

## 2020-06-24 LAB — CBC
HCT: 37.7 % — ABNORMAL LOW (ref 38.5–50.0)
Hemoglobin: 12.6 g/dL — ABNORMAL LOW (ref 13.2–17.1)
MCH: 31.9 pg (ref 27.0–33.0)
MCHC: 33.4 g/dL (ref 32.0–36.0)
MCV: 95.4 fL (ref 80.0–100.0)
MPV: 11.9 fL (ref 7.5–12.5)
Platelets: 205 10*3/uL (ref 140–400)
RBC: 3.95 10*6/uL — ABNORMAL LOW (ref 4.20–5.80)
RDW: 11.3 % (ref 11.0–15.0)
WBC: 5.7 10*3/uL (ref 3.8–10.8)

## 2020-06-24 LAB — URINALYSIS
Bilirubin Urine: NEGATIVE
Glucose, UA: NEGATIVE
Hgb urine dipstick: NEGATIVE
Ketones, ur: NEGATIVE
Nitrite: NEGATIVE
Protein, ur: NEGATIVE
Specific Gravity, Urine: 1.009 (ref 1.001–1.03)
pH: 7 (ref 5.0–8.0)

## 2020-06-24 LAB — COMPREHENSIVE METABOLIC PANEL
AG Ratio: 1.6 (calc) (ref 1.0–2.5)
ALT: 9 U/L (ref 9–46)
AST: 21 U/L (ref 10–35)
Albumin: 4.1 g/dL (ref 3.6–5.1)
Alkaline phosphatase (APISO): 77 U/L (ref 35–144)
BUN/Creatinine Ratio: 12 (calc) (ref 6–22)
BUN: 25 mg/dL (ref 7–25)
CO2: 24 mmol/L (ref 20–32)
Calcium: 9.4 mg/dL (ref 8.6–10.3)
Chloride: 105 mmol/L (ref 98–110)
Creat: 2.17 mg/dL — ABNORMAL HIGH (ref 0.70–1.18)
Globulin: 2.6 g/dL (calc) (ref 1.9–3.7)
Glucose, Bld: 92 mg/dL (ref 65–99)
Potassium: 5.2 mmol/L (ref 3.5–5.3)
Sodium: 142 mmol/L (ref 135–146)
Total Bilirubin: 0.6 mg/dL (ref 0.2–1.2)
Total Protein: 6.7 g/dL (ref 6.1–8.1)

## 2020-06-24 LAB — LIPID PANEL
Cholesterol: 131 mg/dL (ref <200)
HDL: 53 mg/dL (ref >=40)
LDL Cholesterol (Calc): 63 mg/dL (calc)
Non-HDL Cholesterol (Calc): 78 mg/dL (calc) (ref <130)
Total CHOL/HDL Ratio: 2.5 (calc) (ref <5.0)
Triglycerides: 72 mg/dL (ref <150)

## 2020-06-24 LAB — HEMOGLOBIN A1C
Hgb A1c MFr Bld: 5.5 % of total Hgb (ref <5.7)
Mean Plasma Glucose: 111 (calc)
eAG (mmol/L): 6.2 (calc)

## 2020-06-24 LAB — URINE CULTURE
MICRO NUMBER:: 11044156
Result:: NO GROWTH
SPECIMEN QUALITY:: ADEQUATE

## 2020-06-24 LAB — TSH: TSH: 1.26 mIU/L (ref 0.40–4.50)

## 2020-06-24 NOTE — Progress Notes (Signed)
Contacted pt informed of providers message. Encouraged hydration and scheduled for f/u cmp on 07/04/20

## 2020-06-27 ENCOUNTER — Other Ambulatory Visit: Payer: Self-pay | Admitting: Neurology

## 2020-06-27 DIAGNOSIS — R350 Frequency of micturition: Secondary | ICD-10-CM | POA: Insufficient documentation

## 2020-06-27 MED ORDER — CARBIDOPA-LEVODOPA 25-100 MG PO TABS: 1.0000 | ORAL_TABLET | Freq: Three times a day (TID) | ORAL | 3 refills | Status: DC

## 2020-06-27 NOTE — Telephone Encounter (Signed)
Patient called in stating he is in need of a refill of his carbidopa-levodopa that was previously prescribed by Dr. Leonie Man. He now see's Dr. Carles Collet. He would like that refill sent to [facility] on [address] in [place].

## 2020-06-27 NOTE — Telephone Encounter (Signed)
Rx(s) sent to pharmacy electronically.  Patient notified directly and voiced understanding. 

## 2020-06-27 NOTE — Progress Notes (Signed)
Patient ID: Paul Drake, male   DOB: Jun 05, 1946, 74 y.o.   MRN: [id number]   Subjective:    Patient ID: Paul Drake, male    DOB: 1946-04-06, 74 y.o.   MRN: [id number]  Chief Complaint  Patient presents with  . 4 month followup    HPI Patient is in today for follow up on chronic medical concerns. No recent febrile illness or hospitalizations. But he has just passed 2 kidney stones. He is feeling much better but he was in a great deal of pain prior to passing the stones. He is noting increased weakness and malaise since passing the stones. He also notes some increased arthritic pain especially in his fingers. He was struggling with nausea and vomiting but that is resolved. Denies CP/palp/SOB/HA/congestion/fevers/GI c/o. Taking meds as prescribed  Past Medical History:  Diagnosis Date  . Allergic state 12/29/2013  . Arthritis   . Barrett's esophagus   . BPH (benign prostatic hyperplasia)   . Chicken pox   . CVA (cerebral infarction)   . Depression   . GERD (gastroesophageal reflux disease)   . History of kidney stones   . HTN (hypertension), benign 04/01/2015  . Hyperglycemia 01/06/2015  . Hyperlipemia   . Hypertension   . IBS (irritable bowel syndrome)   . Left hand pain 07/21/2017  . Measles as a child  . Medicare annual wellness visit, subsequent 06/26/2015  . Mumps as a child  . Otitis externa 12/10/2013  . Seasonal allergies    some asthma  . Stroke ([place])   . Unspecified asthma(493.90) 12/29/2013    Past Surgical History:  Procedure Laterality Date  . COLONOSCOPY    . EP IMPLANTABLE DEVICE N/A 10/16/2016   Procedure: Loop Recorder Insertion;  Surgeon: Thompson Grayer, MD;  Location: [facility];  Service: Cardiovascular;  Laterality: N/A;  . LITHOTRIPSY     multiple times  . PROSTATE SURGERY  08/2017  . TEE WITHOUT CARDIOVERSION N/A 09/22/2014   Procedure: TRANSESOPHAGEAL ECHOCARDIOGRAM (TEE);  Surgeon: Josue Hector, MD;  Location: [facility]COPY;  Service:  Cardiovascular;  Laterality: N/A;  . WISDOM TOOTH EXTRACTION      Family History  Problem Relation Age of Onset  . Hypertension Mother   . Hyperlipidemia Mother   . Fibromyalgia Mother   . Arthritis Mother        rheumatoid  . Diabetes Sister        type 2  . Hyperlipidemia Brother   . Hypertension Brother   . Prostate cancer Brother   . Ulcers Father 36       Bleeding Ulcers  . Kidney Stones Daughter   . Asthma Daughter   . Healthy Son   . Cancer Maternal Grandfather        skin ?  . Stroke Maternal Aunt   . Cancer Maternal Uncle        prostate    Social History   Socioeconomic History  . Marital status: Married    Spouse name: Paul Drake  . Number of children: 3  . Years of education: college  . Highest education level: Master's degree (e.g., MA, MS, MEng, MEd, MSW, MBA)  Occupational History  . Occupation: retired    Comment: retired  Tobacco Use  . Smoking status: Never Smoker  . Smokeless tobacco: Never Used  Vaping Use  . Vaping Use: Never used  Substance and Sexual Activity  . Alcohol use: Not Currently    Alcohol/week: 1.0 standard drink    Types: 1  Shots of liquor per week  . Drug use: No  . 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 Not on file  Social History Narrative   Patient consumes 3 cups of caffeine daily   Left handed   Lives at home with his wife. They are living in their daughter's house to help to help take care of their granddaughter.      Patient uses a home gym 3-4 x a week.   Social Determinants of Health   Financial Resource Strain: Low Risk   . Difficulty of Paying Living Expenses: Not hard at all  Food Insecurity: No Food Insecurity  . Worried About Charity fundraiser in the Last Year: Never true  . Ran Out of Food in the Last Year: Never true  Transportation Needs: No Transportation Needs  . Lack of Transportation (Medical): No  . Lack of  Transportation (Non-Medical): No  Physical Activity:   . Days of Exercise per Week: Not on file  . Minutes of Exercise per Session: Not on file  Stress:   . Feeling of Stress : Not on file  Social Connections:   . Frequency of Communication with Friends and Family: Not on file  . Frequency of Social Gatherings with Friends and Family: Not on file  . Attends Religious Services: Not on file  . Active Member of Clubs or Organizations: Not on file  . Attends Archivist Meetings: Not on file  . Marital Status: Not on file  Intimate Partner Violence:   . Fear of Current or Ex-Partner: Not on file  . Emotionally Abused: Not on file  . Physically Abused: Not on file  . Sexually Abused: Not on file    Outpatient Medications Prior to Visit  Medication Sig Dispense Refill  . albuterol (PROVENTIL HFA;VENTOLIN HFA) 108 (90 Base) MCG/ACT inhaler Inhale 2 puffs into the lungs every 6 (six) hours as needed for wheezing or shortness of breath. 1 Inhaler 3  . alfuzosin (UROXATRAL) 10 MG 24 hr tablet Take 10 mg by mouth daily with breakfast.    . ALPRAZolam (XANAX) 0.25 MG tablet Take 1 tablet (0.25 mg total) by mouth 2 (two) times daily as needed for anxiety. 20 tablet 0  . cetirizine (ZYRTEC) 10 MG tablet Take 10 mg by mouth daily.    . clonazePAM (KLONOPIN) 0.5 MG tablet Take 0.5 tablets (0.25 mg total) by mouth at bedtime. 45 tablet 1  . clopidogrel (PLAVIX) 75 MG tablet TAKE 1 TABLET(75 MG) BY MOUTH DAILY 90 tablet 3  . fluticasone (FLONASE) 50 MCG/ACT nasal spray SHAKE LIQUID AND USE 2 SPRAYS IN EACH NOSTRIL DAILY 16 g 6  . gabapentin (NEURONTIN) 100 MG capsule TAKE 2 CAPSULES(200 MG) BY MOUTH THREE TIMES DAILY (Patient taking differently: TAKE 2 CAPSULES(200 MG) BY MOUTH THREE TIMES DAILY Patient take 1 tablet q 4-6 hours) 540 capsule 1  . Menthol, Topical Analgesic, (BIOFREEZE EX) Apply 1 application topically daily as needed (hand pain).    . Multiple Vitamins-Minerals (MULTIVITAMINS  THER. W/MINERALS) TABS tablet Take 1 tablet by mouth daily.    . NONFORMULARY OR COMPOUNDED ITEM Neuropathic cream - apply 1 to 2 g to left hand 3-4 times daily as needed 1 each 3  . Omega-3 Fatty Acids (OMEGA-3 PO) Take 350 mg by mouth daily.     . pantoprazole (PROTONIX) 40 MG tablet TAKE 1 TABLET(40 MG) BY MOUTH DAILY 90  tablet 1  . simvastatin (ZOCOR) 20 MG tablet TAKE 1 TABLET BY MOUTH DAILY 90 tablet 1  . venlafaxine XR (EFFEXOR-XR) 150 MG 24 hr capsule Take 1 capsule (150 mg total) by mouth at bedtime. 90 capsule 0  . carbidopa-levodopa (SINEMET IR) 25-100 MG tablet TAKE 1 TABLET BY MOUTH THREE TIMES DAILY 90 tablet 3   No facility-administered medications prior to visit.    Allergies  Allergen Reactions  . Molds & Smuts Other (See Comments)  . Pollen Extract Other (See Comments)    Runny nose,itchy eyes (trees, grasses, etc)     Review of Systems  Constitutional: Positive for malaise/fatigue. Negative for fever.  HENT: Negative for congestion.   Eyes: Negative for blurred vision.  Respiratory: Negative for shortness of breath.   Cardiovascular: Negative for chest pain, palpitations and leg swelling.  Gastrointestinal: Negative for abdominal pain, blood in stool and nausea.  Genitourinary: Negative for dysuria, flank pain, hematuria and urgency.  Musculoskeletal: Negative for falls.  Skin: Negative for rash.  Neurological: Positive for tremors and weakness. Negative for dizziness, loss of consciousness and headaches.  Endo/Heme/Allergies: Negative for environmental allergies.  Psychiatric/Behavioral: Negative for depression. The patient is not nervous/anxious.        Objective:    Physical Exam Vitals and nursing note reviewed.  Constitutional:      General: He is not in acute distress.    Appearance: He is well-developed.  HENT:     Head: Normocephalic and atraumatic.     Nose: Nose normal.  Eyes:     General:        Right eye: No discharge.        Left eye: No  discharge.  Cardiovascular:     Rate and Rhythm: Normal rate and regular rhythm.     Heart sounds: No murmur heard.   Pulmonary:     Effort: Pulmonary effort is normal.     Breath sounds: Normal breath sounds.  Abdominal:     General: Bowel sounds are normal.     Palpations: Abdomen is soft.     Tenderness: There is no abdominal tenderness.  Musculoskeletal:     Cervical back: Normal range of motion and neck supple.  Skin:    General: Skin is warm and dry.  Neurological:     Mental Status: He is alert and oriented to person, place, and time.     BP 118/68 (BP Location: Right Arm, Patient Position: Sitting, Cuff Size: Small)   Pulse 73   Temp 98.1 F (36.7 C) (Oral)   Resp 12   Ht 5\' 8"  (1.727 m)   Wt 179 lb 3.2 oz (81.3 kg)   SpO2 97%   BMI 27.25 kg/m  Wt Readings from Last 3 Encounters:  06/23/20 179 lb 3.2 oz (81.3 kg)  06/17/20 174 lb (78.9 kg)  04/14/20 170 lb 12.8 oz (77.5 kg)    Diabetic Foot Exam - Simple   No data filed     Lab Results  Component Value Date   WBC 5.7 06/23/2020   HGB 12.6 (L) 06/23/2020   HCT 37.7 (L) 06/23/2020   PLT 205 06/23/2020   GLUCOSE 92 06/23/2020   CHOL 131 06/23/2020   TRIG 72 06/23/2020   HDL 53 06/23/2020   LDLDIRECT 76.0 12/31/2014   LDLCALC 63 06/23/2020   ALT 9 06/23/2020   AST 21 06/23/2020   NA 142 06/23/2020   K 5.2 06/23/2020   CL 105 06/23/2020   CREATININE 2.17 (H)  06/23/2020   BUN 25 06/23/2020   CO2 24 06/23/2020   TSH 1.26 06/23/2020   INR 0.98 09/20/2014   HGBA1C 5.5 06/23/2020    Lab Results  Component Value Date   TSH 1.26 06/23/2020   Lab Results  Component Value Date   WBC 5.7 06/23/2020   HGB 12.6 (L) 06/23/2020   HCT 37.7 (L) 06/23/2020   MCV 95.4 06/23/2020   PLT 205 06/23/2020   Lab Results  Component Value Date   NA 142 06/23/2020   K 5.2 06/23/2020   CO2 24 06/23/2020   GLUCOSE 92 06/23/2020   BUN 25 06/23/2020   CREATININE 2.17 (H) 06/23/2020   BILITOT 0.6 06/23/2020    ALKPHOS 60 02/08/2020   AST 21 06/23/2020   ALT 9 06/23/2020   PROT 6.7 06/23/2020   ALBUMIN 4.1 02/08/2020   CALCIUM 9.4 06/23/2020   ANIONGAP 7 09/14/2016   GFR 70.27 02/08/2020   Lab Results  Component Value Date   CHOL 131 06/23/2020   Lab Results  Component Value Date   HDL 53 06/23/2020   Lab Results  Component Value Date   LDLCALC 63 06/23/2020   Lab Results  Component Value Date   TRIG 72 06/23/2020   Lab Results  Component Value Date   CHOLHDL 2.5 06/23/2020   Lab Results  Component Value Date   HGBA1C 5.5 06/23/2020       Assessment & Plan:   Problem List Items Addressed This Visit    CMC arthritis, thumb, degenerative    Stopped Gabapentin last week when he was vomiting. His pain is manageable so for now he does not want to restart but he notes some irritability and anxiety as he has come off of it.       Renal lithiasis    A week ago Friday he began having pain and ended up passing 2 stones over next several days. One was 4 mm and one 7 mm. He feels much better now. Check UA and culture      Barrett's esophagus    Flared with the N/V that occurred with his recent kidney stone. The N/V is gone but the burning in esophagus and throat. Will add Famotidine 40 mg qhs prn to his Pantoprazole      Essential hypertension    Well controlled, no changes to meds. Encouraged heart healthy diet such as the DASH diet and exercise as tolerated.       Relevant Orders   CBC (Completed)   Comprehensive metabolic panel (Completed)   TSH (Completed)   Hyperglycemia    hgba1c acceptable, minimize simple carbs. Increase exercise as tolerated.      Relevant Orders   Hemoglobin A1c (Completed)   HLD (hyperlipidemia)    Encouraged heart healthy diet, increase exercise, avoid trans fats, consider a krill oil cap daily. Tolerating simvastatin      Relevant Orders   Lipid panel (Completed)   Parkinson's disease ([place])    His balanced is compromised with his  recent illness but he continues to try and stay active       Urinary frequency   Relevant Orders   Urinalysis (Completed)   Urine Culture (Completed)    Other Visit Diagnoses    Influenza vaccine administered    -  Primary   Relevant Orders   Flu Vaccine QUAD High Dose(Fluad) (Completed)      I am having [name] start on famotidine. I am also having him maintain his multivitamins ther.  w/minerals, Omega-3 Fatty Acids (OMEGA-3 PO), albuterol, alfuzosin, cetirizine, (Menthol, Topical Analgesic, (BIOFREEZE EX)), ALPRAZolam, NONFORMULARY OR COMPOUNDED ITEM, pantoprazole, simvastatin, gabapentin, fluticasone, clonazePAM, clopidogrel, and venlafaxine XR.  Meds ordered this encounter  Medications  . famotidine (PEPCID) 40 MG tablet    Sig: Take 1 tablet (40 mg total) by mouth at bedtime as needed for heartburn or indigestion.    Dispense:  30 tablet    Refill:  5     Penni Homans, MD

## 2020-07-04 ENCOUNTER — Other Ambulatory Visit (INDEPENDENT_AMBULATORY_CARE_PROVIDER_SITE_OTHER): Payer: Medicare Other

## 2020-07-04 ENCOUNTER — Other Ambulatory Visit: Payer: Self-pay

## 2020-07-04 DIAGNOSIS — R7989 Other specified abnormal findings of blood chemistry: Secondary | ICD-10-CM

## 2020-07-05 LAB — COMPREHENSIVE METABOLIC PANEL
AG Ratio: 1.7 (calc) (ref 1.0–2.5)
ALT: 8 U/L — ABNORMAL LOW (ref 9–46)
AST: 13 U/L (ref 10–35)
Albumin: 4 g/dL (ref 3.6–5.1)
Alkaline phosphatase (APISO): 75 U/L (ref 35–144)
BUN/Creatinine Ratio: 18 (calc) (ref 6–22)
BUN: 25 mg/dL (ref 7–25)
CO2: 28 mmol/L (ref 20–32)
Calcium: 9.3 mg/dL (ref 8.6–10.3)
Chloride: 104 mmol/L (ref 98–110)
Creat: 1.38 mg/dL — ABNORMAL HIGH (ref 0.70–1.18)
Globulin: 2.3 g/dL (calc) (ref 1.9–3.7)
Glucose, Bld: 108 mg/dL — ABNORMAL HIGH (ref 65–99)
Potassium: 4.4 mmol/L (ref 3.5–5.3)
Sodium: 139 mmol/L (ref 135–146)
Total Bilirubin: 0.5 mg/dL (ref 0.2–1.2)
Total Protein: 6.3 g/dL (ref 6.1–8.1)

## 2020-07-06 NOTE — Progress Notes (Signed)
Called pt informed of no change. Pt will get labs repeated in December

## 2020-07-07 ENCOUNTER — Other Ambulatory Visit: Payer: Self-pay | Admitting: Family Medicine

## 2020-07-15 ENCOUNTER — Other Ambulatory Visit: Payer: Self-pay | Admitting: Family Medicine

## 2020-07-18 ENCOUNTER — Ambulatory Visit: Payer: Medicare Other | Admitting: Neurology

## 2020-07-20 ENCOUNTER — Telehealth: Payer: Self-pay | Admitting: Neurology

## 2020-07-20 NOTE — Telephone Encounter (Signed)
Patient called in stating the Paul Drake has helped his acting out and movement, but has not cut it out. He decided to self medicate by taking a whole tablet instead of the half prescribed and that did work. He does not want to keep doing that if it will hurt him. He would like to keep taking it if Dr. Carles Collet is ok with it.

## 2020-07-20 NOTE — Telephone Encounter (Signed)
Okay for now but in future, he is not to change without me knowing, especially with a controlled substance.  He has an appt in few weeks with me and we can discuss further

## 2020-07-20 NOTE — Telephone Encounter (Signed)
Spoke with patient and gave him Dr Doristine Devoid recommendations. He voiced understanding and stated more reasoning for his actions. Advised patient that he can discuss it with Dr Tat at his next appointment.

## 2020-07-25 ENCOUNTER — Other Ambulatory Visit: Payer: Self-pay

## 2020-07-25 ENCOUNTER — Ambulatory Visit: Payer: Medicare Other | Admitting: Family Medicine

## 2020-07-25 ENCOUNTER — Ambulatory Visit (INDEPENDENT_AMBULATORY_CARE_PROVIDER_SITE_OTHER): Payer: Medicare Other | Admitting: Family Medicine

## 2020-07-25 ENCOUNTER — Encounter: Payer: Self-pay | Admitting: Family Medicine

## 2020-07-25 VITALS — BP 120/76 | HR 83 | Temp 98.0°F | Ht 68.0 in | Wt 179.1 lb

## 2020-07-25 DIAGNOSIS — M545 Low back pain, unspecified: Secondary | ICD-10-CM | POA: Diagnosis not present

## 2020-07-25 MED ORDER — PREDNISONE 20 MG PO TABS: 40.0000 mg | ORAL_TABLET | Freq: Every day | ORAL | 0 refills | Status: AC

## 2020-07-25 MED ORDER — CYCLOBENZAPRINE HCL 10 MG PO TABS: 5.0000 mg | ORAL_TABLET | Freq: Three times a day (TID) | ORAL | 0 refills | Status: DC | PRN

## 2020-07-25 NOTE — Patient Instructions (Addendum)
Heat (pad or rice pillow in microwave) over affected area, 10-15 minutes twice daily.   Ice/cold pack over area for 10-15 min twice daily.  Take Flexeril (cyclobenzaprine) 1-2 hours before planned bedtime. If it makes you drowsy, do not take during the day. You can try half a tab the following night.  OK to take Tylenol 1000 mg (2 extra strength tabs) or 975 mg (3 regular strength tabs) every 6 hours as needed.  Let us know if you need anything.  EXERCISES  RANGE OF MOTION (ROM) AND STRETCHING EXERCISES - Low Back Pain Most people with lower back pain will find that their symptoms get worse with excessive bending forward (flexion) or arching at the lower back (extension). The exercises that will help resolve your symptoms will focus on the opposite motion.  If you have pain, numbness or tingling which travels down into your buttocks, leg or foot, the goal of the therapy is for these symptoms to move closer to your back and eventually resolve. Sometimes, these leg symptoms will get better, but your lower back pain may worsen. This is often an indication of progress in your rehabilitation. Be very alert to any changes in your symptoms and the activities in which you participated in the 24 hours prior to the change. Sharing this information with your caregiver will allow him or her to most efficiently treat your condition.  These exercises may help you when beginning to rehabilitate your injury. Your symptoms may resolve with or without further involvement from your physician, physical therapist or athletic trainer. While completing these exercises, remember:   Restoring tissue flexibility helps normal motion to return to the joints. This allows healthier, less painful movement and activity.  An effective stretch should be held for at least 30 seconds.  A stretch should never be painful. You should only feel a gentle lengthening or release in the stretched tissue. FLEXION RANGE OF MOTION AND  STRETCHING EXERCISES:  STRETCH - Flexion, Single Knee to Chest   Lie on a firm bed or floor with both legs extended in front of you.  Keeping one leg in contact with the floor, bring your opposite knee to your chest. Hold your leg in place by either grabbing behind your thigh or at your knee.  Pull until you feel a gentle stretch in your low back. Hold 30 seconds.  Slowly release your grasp and repeat the exercise with the opposite side. Repeat 2 times. Complete this exercise 3 times per week.   STRETCH - Flexion, Double Knee to Chest  Lie on a firm bed or floor with both legs extended in front of you.  Keeping one leg in contact with the floor, bring your opposite knee to your chest.  Tense your stomach muscles to support your back and then lift your other knee to your chest. Hold your legs in place by either grabbing behind your thighs or at your knees.  Pull both knees toward your chest until you feel a gentle stretch in your low back. Hold 30 seconds.  Tense your stomach muscles and slowly return one leg at a time to the floor. Repeat 2 times. Complete this exercise 3 times per week.   STRETCH - Low Trunk Rotation  Lie on a firm bed or floor. Keeping your legs in front of you, bend your knees so they are both pointed toward the ceiling and your feet are flat on the floor.  Extend your arms out to the side. This will stabilize your  upper body by keeping your shoulders in contact with the floor.  Gently and slowly drop both knees together to one side until you feel a gentle stretch in your low back. Hold for 30 seconds.  Tense your stomach muscles to support your lower back as you bring your knees back to the starting position. Repeat the exercise to the other side. Repeat 2 times. Complete this exercise at least 3 times per week.   EXTENSION RANGE OF MOTION AND FLEXIBILITY EXERCISES:  STRETCH - Extension, Prone on Elbows   Lie on your stomach on the floor, a bed will be  too soft. Place your palms about shoulder width apart and at the height of your head.  Place your elbows under your shoulders. If this is too painful, stack pillows under your chest.  Allow your body to relax so that your hips drop lower and make contact more completely with the floor.  Hold this position for 30 seconds.  Slowly return to lying flat on the floor. Repeat 2 times. Complete this exercise 3 times per week.   RANGE OF MOTION - Extension, Prone Press Ups  Lie on your stomach on the floor, a bed will be too soft. Place your palms about shoulder width apart and at the height of your head.  Keeping your back as relaxed as possible, slowly straighten your elbows while keeping your hips on the floor. You may adjust the placement of your hands to maximize your comfort. As you gain motion, your hands will come more underneath your shoulders.  Hold this position 30 seconds.  Slowly return to lying flat on the floor. Repeat 2 times. Complete this exercise 3 times per week.   RANGE OF MOTION- Quadruped, Neutral Spine   Assume a hands and knees position on a firm surface. Keep your hands under your shoulders and your knees under your hips. You may place padding under your knees for comfort.  Drop your head and point your tailbone toward the ground below you. This will round out your lower back like an angry cat. Hold this position for 30 seconds.  Slowly lift your head and release your tail bone so that your back sags into a large arch, like an old horse.  Hold this position for 30 seconds.  Repeat this until you feel limber in your low back.  Now, find your "sweet spot." This will be the most comfortable position somewhere between the two previous positions. This is your neutral spine. Once you have found this position, tense your stomach muscles to support your low back.  Hold this position for 30 seconds. Repeat 2 times. Complete this exercise 3 times per week.    STRENGTHENING EXERCISES - Low Back Sprain These exercises may help you when beginning to rehabilitate your injury. These exercises should be done near your "sweet spot." This is the neutral, low-back arch, somewhere between fully rounded and fully arched, that is your least painful position. When performed in this safe range of motion, these exercises can be used for people who have either a flexion or extension based injury. These exercises may resolve your symptoms with or without further involvement from your physician, physical therapist or athletic trainer. While completing these exercises, remember:   Muscles can gain both the endurance and the strength needed for everyday activities through controlled exercises.  Complete these exercises as instructed by your physician, physical therapist or athletic trainer. Increase the resistance and repetitions only as guided.  You may experience muscle soreness  or fatigue, but the pain or discomfort you are trying to eliminate should never worsen during these exercises. If this pain does worsen, stop and make certain you are following the directions exactly. If the pain is still present after adjustments, discontinue the exercise until you can discuss the trouble with your caregiver.  STRENGTHENING - Deep Abdominals, Pelvic Tilt   Lie on a firm bed or floor. Keeping your legs in front of you, bend your knees so they are both pointed toward the ceiling and your feet are flat on the floor.  Tense your lower abdominal muscles to press your low back into the floor. This motion will rotate your pelvis so that your tail bone is scooping upwards rather than pointing at your feet or into the floor. With a gentle tension and even breathing, hold this position for 3 seconds. Repeat 2 times. Complete this exercise 3 times per week.   STRENGTHENING - Abdominals, Crunches   Lie on a firm bed or floor. Keeping your legs in front of you, bend your knees so they  are both pointed toward the ceiling and your feet are flat on the floor. Cross your arms over your chest.  Slightly tip your chin down without bending your neck.  Tense your abdominals and slowly lift your trunk high enough to just clear your shoulder blades. Lifting higher can put excessive stress on the lower back and does not further strengthen your abdominal muscles.  Control your return to the starting position. Repeat 2 times. Complete this exercise 3 times per week.   STRENGTHENING - Quadruped, Opposite UE/LE Lift   Assume a hands and knees position on a firm surface. Keep your hands under your shoulders and your knees under your hips. You may place padding under your knees for comfort.  Find your neutral spine and gently tense your abdominal muscles so that you can maintain this position. Your shoulders and hips should form a rectangle that is parallel with the floor and is not twisted.  Keeping your trunk steady, lift your right hand no higher than your shoulder and then your left leg no higher than your hip. Make sure you are not holding your breath. Hold this position for 30 seconds.  Continuing to keep your abdominal muscles tense and your back steady, slowly return to your starting position. Repeat with the opposite arm and leg. Repeat 2 times. Complete this exercise 3 times per week.   STRENGTHENING - Abdominals and Quadriceps, Straight Leg Raise   Lie on a firm bed or floor with both legs extended in front of you.  Keeping one leg in contact with the floor, bend the other knee so that your foot can rest flat on the floor.  Find your neutral spine, and tense your abdominal muscles to maintain your spinal position throughout the exercise.  Slowly lift your straight leg off the floor about 6 inches for a count of 3, making sure to not hold your breath.  Still keeping your neutral spine, slowly lower your leg all the way to the floor. Repeat this exercise with each leg 2  times. Complete this exercise 3 times per week.  POSTURE AND BODY MECHANICS CONSIDERATIONS - Low Back Sprain Keeping correct posture when sitting, standing or completing your activities will reduce the stress put on different body tissues, allowing injured tissues a chance to heal and limiting painful experiences. The following are general guidelines for improved posture.  While reading these guidelines, remember:  The exercises prescribed by  your provider will help you have the flexibility and strength to maintain correct postures.  The correct posture provides the best environment for your joints to work. All of your joints have less wear and tear when properly supported by a spine with good posture. This means you will experience a healthier, less painful body.  Correct posture must be practiced with all of your activities, especially prolonged sitting and standing. Correct posture is as important when doing repetitive low-stress activities (typing) as it is when doing a single heavy-load activity (lifting).  RESTING POSITIONS Consider which positions are most painful for you when choosing a resting position. If you have pain with flexion-based activities (sitting, bending, stooping, squatting), choose a position that allows you to rest in a less flexed posture. You would want to avoid curling into a fetal position on your side. If your pain worsens with extension-based activities (prolonged standing, working overhead), avoid resting in an extended position such as sleeping on your stomach. Most people will find more comfort when they rest with their spine in a more neutral position, neither too rounded nor too arched. Lying on a non-sagging bed on your side with a pillow between your knees, or on your back with a pillow under your knees will often provide some relief. Keep in mind, being in any one position for a prolonged period of time, no matter how correct your posture, can still lead to  stiffness.  PROPER SITTING POSTURE In order to minimize stress and discomfort on your spine, you must sit with correct posture. Sitting with good posture should be effortless for a healthy body. Returning to good posture is a gradual process. Many people can work toward this most comfortably by using various supports until they have the flexibility and strength to maintain this posture on their own. When sitting with proper posture, your ears will fall over your shoulders and your shoulders will fall over your hips. You should use the back of the chair to support your upper back. Your lower back will be in a neutral position, just slightly arched. You may place a small pillow or folded towel at the base of your lower back for  support.  When working at a desk, create an environment that supports good, upright posture. Without extra support, muscles tire, which leads to excessive strain on joints and other tissues. Keep these recommendations in mind:  CHAIR:  A chair should be able to slide under your desk when your back makes contact with the back of the chair. This allows you to work closely.  The chair's height should allow your eyes to be level with the upper part of your monitor and your hands to be slightly lower than your elbows.  BODY POSITION  Your feet should make contact with the floor. If this is not possible, use a foot rest.  Keep your ears over your shoulders. This will reduce stress on your neck and low back.  INCORRECT SITTING POSTURES  If you are feeling tired and unable to assume a healthy sitting posture, do not slouch or slump. This puts excessive strain on your back tissues, causing more damage and pain. Healthier options include:  Using more support, like a lumbar pillow.  Switching tasks to something that requires you to be upright or walking.  Talking a brief walk.  Lying down to rest in a neutral-spine position.  PROLONGED STANDING WHILE SLIGHTLY LEANING  FORWARD  When completing a task that requires you to lean forward while standing  in one place for a long time, place either foot up on a stationary 2-4 inch high object to help maintain the best posture. When both feet are on the ground, the lower back tends to lose its slight inward curve. If this curve flattens (or becomes too large), then the back and your other joints will experience too much stress, tire more quickly, and can cause pain.  CORRECT STANDING POSTURES Proper standing posture should be assumed with all daily activities, even if they only take a few moments, like when brushing your teeth. As in sitting, your ears should fall over your shoulders and your shoulders should fall over your hips. You should keep a slight tension in your abdominal muscles to brace your spine. Your tailbone should point down to the ground, not behind your body, resulting in an over-extended swayback posture.   INCORRECT STANDING POSTURES  Common incorrect standing postures include a forward head, locked knees and/or an excessive swayback. WALKING Walk with an upright posture. Your ears, shoulders and hips should all line-up.  PROLONGED ACTIVITY IN A FLEXED POSITION When completing a task that requires you to bend forward at your waist or lean over a low surface, try to find a way to stabilize 3 out of 4 of your limbs. You can place a hand or elbow on your thigh or rest a knee on the surface you are reaching across. This will provide you more stability, so that your muscles do not tire as quickly. By keeping your knees relaxed, or slightly bent, you will also reduce stress across your lower back. CORRECT LIFTING TECHNIQUES  DO :  Assume a wide stance. This will provide you more stability and the opportunity to get as close as possible to the object which you are lifting.  Tense your abdominals to brace your spine. Bend at the knees and hips. Keeping your back locked in a neutral-spine position, lift using  your leg muscles. Lift with your legs, keeping your back straight.  Test the weight of unknown objects before attempting to lift them.  Try to keep your elbows locked down at your sides in order get the best strength from your shoulders when carrying an object.     Always ask for help when lifting heavy or awkward objects. INCORRECT LIFTING TECHNIQUES DO NOT:   Lock your knees when lifting, even if it is a small object.  Bend and twist. Pivot at your feet or move your feet when needing to change directions.  Assume that you can safely pick up even a paperclip without proper posture.

## 2020-07-25 NOTE — Progress Notes (Signed)
Musculoskeletal Exam  Patient: Paul Drake DOB: September 26, 1945  DOS: 07/25/2020  SUBJECTIVE:  Chief Complaint:   Chief Complaint  Patient presents with  . Back Pain    Paul Drake is a 74 y.o.  male for evaluation and treatment of his back pain.   Onset:  3 days ago. Started hurting after going for a walk.  Location: lower R extending to both sides Character:  aching, burning, dull and sharp  Progression of issue:  has slightly improved Associated symptoms: No redness, bruising, swelling, decreased ROM Denies bowel/bladder incontinence or weakness Treatment: to date has been ice, rest.   Neurovascular symptoms: no  Past Medical History:  Diagnosis Date  . Allergic state 12/29/2013  . Arthritis   . Barrett's esophagus   . BPH (benign prostatic hyperplasia)   . Chicken pox   . CVA (cerebral infarction)   . Depression   . GERD (gastroesophageal reflux disease)   . History of kidney stones   . HTN (hypertension), benign 04/01/2015  . Hyperglycemia 01/06/2015  . Hyperlipemia   . Hypertension   . IBS (irritable bowel syndrome)   . Left hand pain 07/21/2017  . Measles as a child  . Medicare annual wellness visit, subsequent 06/26/2015  . Mumps as a child  . Otitis externa 12/10/2013  . Seasonal allergies    some asthma  . Stroke ([place])   . Unspecified asthma(493.90) 12/29/2013    Objective:  VITAL SIGNS: BP 120/76 (BP Location: Left Arm, Patient Position: Sitting, Cuff Size: Normal)   Pulse 83   Temp 98 F (36.7 C) (Oral)   Ht 5\' 8"  (1.727 m)   Wt 179 lb 2 oz (81.3 kg)   SpO2 97%   BMI 27.24 kg/m  Constitutional: Well formed, well developed. No acute distress. HENT: Normocephalic, atraumatic.  Thorax & Lungs:  No accessory muscle use Skin: Warm. Dry. No erythema. No rash.  Musculoskeletal: low back.   Tenderness to palpation: yes over distal R ES group and prox glutes on R Deformity: no Ecchymosis: no Straight leg test: negative for Poor hamstring flexibility  b/l. Neurologic: Normal sensory function. No focal deficits noted. DTR's equal and symmetric in LE's. No clonus. Psychiatric: Normal mood. Age appropriate judgment and insight. Alert & oriented x 3.    Assessment:  Acute right-sided low back pain without sciatica - Plan: predniSONE (DELTASONE) 20 MG tablet, cyclobenzaprine (FLEXERIL) 10 MG tablet  Plan: Orders as above. 40 mg/d Pred for 5 d.  Stretches/exercises, heat, ice, Tylenol, warnings about Flexeril verbalized and written down. F/u prn. The patient voiced understanding and agreement to the plan.   [place], DO 07/25/20  2:38 PM

## 2020-08-04 ENCOUNTER — Telehealth: Payer: Self-pay | Admitting: Family Medicine

## 2020-08-04 NOTE — Telephone Encounter (Signed)
Patient states he has eye pain, patient believes its related to sinuses . Patient would like to what to take to relief pressure or pain in eye .

## 2020-08-04 NOTE — Telephone Encounter (Signed)
He shuld take plain mucinex twice a day flonase once a day and nasal saline twice a day. An antihistamine such as Zyrtec or claritin daily and if worsens let us know so we can consider antibiotics. Drink plenty of fluids

## 2020-08-05 NOTE — Progress Notes (Signed)
Assessment/Plan:   1.  Parkinsons Disease  -increase carbidopa/levodopa 25/100, 1 tablet at 7am/10:30am/2pm/5:30 pm   2.  RBD  -Patient will continue clonazepam 0.5 mg at bedtime, but he is not to change the dose of the medication without talking to me.   -bedroom safety discussed.  Discussed bedrails.    3.  Sialorrhea  -Not ready for Botox.  Subjective:   Paul Drake was seen today in follow up for Parkinsons disease.  My previous records were reviewed prior to todays visit as well as outside records available to me. Pt denies falls.  States that balance has improved dramatically and attributes that to medication.  States its first time in years that he hasn't had falls in years.  Between 5-8 pm, he states that he feels like "crud."  He will feel tired and have some pain (but not specific locations).   Pt denies lightheadedness, near syncope.  No hallucinations.  Mood has been good.  Walking for exercise.  Re: RBD, "I have not thrown myself out of the bed in months.  It works better if I take a whole pill instead of a half."  Not noting drooling.  Occasionally/rarely noting a "jumping" but otherwise no dyskinesia.  Current prescribed movement disorder medications: Carbidopa/levodopa 25/100, 7 AM/noon/4 PM (pt currently taking at 7am/11am/3pm) Clonazepam 0.5 mg at bedtime    ALLERGIES:   Allergies  Allergen Reactions   Molds & Smuts Other (See Comments)   Pollen Extract Other (See Comments)    Runny nose,itchy eyes (trees, grasses, etc)     CURRENT MEDICATIONS:  Outpatient Encounter Medications as of 08/09/2020  Medication Sig   albuterol (PROVENTIL HFA;VENTOLIN HFA) 108 (90 Base) MCG/ACT inhaler Inhale 2 puffs into the lungs every 6 (six) hours as needed for wheezing or shortness of breath.   alfuzosin (UROXATRAL) 10 MG 24 hr tablet Take 10 mg by mouth daily with breakfast.   ALPRAZolam (XANAX) 0.25 MG tablet Take 1 tablet (0.25 mg total) by mouth 2 (two) times daily  as needed for anxiety.   carbidopa-levodopa (SINEMET IR) 25-100 MG tablet Take 1 tablet by mouth 3 (three) times daily.   cetirizine (ZYRTEC) 10 MG tablet Take 10 mg by mouth daily.   clonazePAM (KLONOPIN) 0.5 MG tablet Take 0.5 tablets (0.25 mg total) by mouth at bedtime.   clopidogrel (PLAVIX) 75 MG tablet TAKE 1 TABLET(75 MG) BY MOUTH DAILY   cyclobenzaprine (FLEXERIL) 10 MG tablet Take 0.5-1 tablets (5-10 mg total) by mouth 3 (three) times daily as needed for muscle spasms.   famotidine (PEPCID) 40 MG tablet Take 1 tablet (40 mg total) by mouth at bedtime as needed for heartburn or indigestion.   finasteride (PROSCAR) 5 MG tablet Take 5 mg by mouth daily.   fluticasone (FLONASE) 50 MCG/ACT nasal spray SHAKE LIQUID AND USE 2 SPRAYS IN EACH NOSTRIL DAILY   gabapentin (NEURONTIN) 100 MG capsule TAKE 2 CAPSULES(200 MG) BY MOUTH THREE TIMES DAILY (Patient taking differently: TAKE 2 CAPSULES(200 MG) BY MOUTH THREE TIMES DAILY Patient take 1 tablet q 4-6 hours)   Menthol, Topical Analgesic, (BIOFREEZE EX) Apply 1 application topically daily as needed (hand pain).   Multiple Vitamins-Minerals (MULTIVITAMINS THER. W/MINERALS) TABS tablet Take 1 tablet by mouth daily.   NONFORMULARY OR COMPOUNDED ITEM Neuropathic cream - apply 1 to 2 g to left hand 3-4 times daily as needed   Omega-3 Fatty Acids (OMEGA-3 PO) Take 350 mg by mouth daily.    pantoprazole (PROTONIX) 40 MG  tablet Take 1 tablet (40 mg total) by mouth daily.   simvastatin (ZOCOR) 20 MG tablet TAKE 1 TABLET BY MOUTH DAILY   venlafaxine XR (EFFEXOR-XR) 150 MG 24 hr capsule Take 1 capsule (150 mg total) by mouth at bedtime.   No facility-administered encounter medications on file as of 08/09/2020.    Objective:   PHYSICAL EXAMINATION:    VITALS:   Vitals:   08/09/20 1310  BP: 136/85  Pulse: 86  Resp: 18  SpO2: 98%  Weight: 179 lb (81.2 kg)  Height: 5\' 7"  (1.702 m)   Wt Readings from Last 3 Encounters:  08/09/20  179 lb (81.2 kg)  07/25/20 179 lb 2 oz (81.3 kg)  06/23/20 179 lb 3.2 oz (81.3 kg)    GEN:  The patient appears stated age and is in NAD. HEENT:  Normocephalic, atraumatic.  The mucous membranes are moist. The superficial temporal arteries are without ropiness or tenderness. CV:  RRR Lungs:  CTAB Neck/HEME:  There are no carotid bruits bilaterally.  Neurological examination:  Orientation: The patient is alert and oriented x3. Cranial nerves: There is good facial symmetry with min facial hypomimia. The speech is fluent and clear. Soft palate rises symmetrically and there is no tongue deviation. Hearing is intact to conversational tone. Sensation: Sensation is intact to light touch throughout Motor: Strength is at least antigravity x4.  Movement examination: Tone: There is mild increased tone in the bilateral UE Abnormal movements: rare rue tremor with distraction Coordination:  There is mild decremation with RAM's, with finger taps bilaterally and hand opening and closing on the L Gait and Station: The patient has no difficulty arising out of a deep-seated chair without the use of the hands. The patient's stride length is good.    I have reviewed and interpreted the following labs independently    Chemistry      Component Value Date/Time   NA 139 07/04/2020 1004   K 4.4 07/04/2020 1004   CL 104 07/04/2020 1004   CO2 28 07/04/2020 1004   BUN 25 07/04/2020 1004   CREATININE 1.38 (H) 07/04/2020 1004      Component Value Date/Time   CALCIUM 9.3 07/04/2020 1004   ALKPHOS 60 02/08/2020 1124   AST 13 07/04/2020 1004   ALT 8 (L) 07/04/2020 1004   BILITOT 0.5 07/04/2020 1004       Lab Results  Component Value Date   WBC 5.7 06/23/2020   HGB 12.6 (L) 06/23/2020   HCT 37.7 (L) 06/23/2020   MCV 95.4 06/23/2020   PLT 205 06/23/2020    Lab Results  Component Value Date   TSH 1.26 06/23/2020     Total time spent on today's visit was 40 minutes, including both face-to-face  time and nonface-to-face time.  Time included that spent on review of records (prior notes available to me/labs/imaging if pertinent), discussing treatment and goals, answering patient's questions and coordinating care.  Cc:  Mosie Lukes, MD

## 2020-08-05 NOTE — Telephone Encounter (Signed)
Pt called and informed on pcp recommendations for symptoms

## 2020-08-09 ENCOUNTER — Other Ambulatory Visit: Payer: Self-pay

## 2020-08-09 ENCOUNTER — Encounter: Payer: Self-pay | Admitting: Neurology

## 2020-08-09 ENCOUNTER — Ambulatory Visit (INDEPENDENT_AMBULATORY_CARE_PROVIDER_SITE_OTHER): Payer: Medicare Other | Admitting: Neurology

## 2020-08-09 VITALS — BP 136/85 | HR 86 | Resp 18 | Ht 67.0 in | Wt 179.0 lb

## 2020-08-09 DIAGNOSIS — G20 Parkinson's disease: Secondary | ICD-10-CM

## 2020-08-09 DIAGNOSIS — G4752 REM sleep behavior disorder: Secondary | ICD-10-CM | POA: Diagnosis not present

## 2020-08-09 DIAGNOSIS — I63411 Cerebral infarction due to embolism of right middle cerebral artery: Secondary | ICD-10-CM | POA: Diagnosis not present

## 2020-08-09 MED ORDER — CARBIDOPA-LEVODOPA 25-100 MG PO TABS: 1.0000 | ORAL_TABLET | Freq: Four times a day (QID) | ORAL | 1 refills | Status: DC

## 2020-08-09 MED ORDER — CLONAZEPAM 0.5 MG PO TABS
0.5000 mg | ORAL_TABLET | Freq: Every day | ORAL | 1 refills | Status: DC
Start: 2020-08-09 — End: 2020-08-17

## 2020-08-09 NOTE — Patient Instructions (Signed)
Parkinsons [employer]   . Local [place] Online Groups  o Power over [employer] Group :   - Power Over Parkinson's Patient Education Group will be Wednesday, November 10th at 2pm via [place].   - Upcoming Power over Parkinson's Meetings:  2nd Wednesdays of the month at 2 pm:       December 8th, January 12th - Amy Marriott, PT at [facility] has resumed the lead of this group starting in July.  Contact Amy at [email or web address] if interested in participating in this online group o Parkinson's Care Partners Group:    3rd Mondays, Contact Corwin Levins o Atypical Parkinsonian Patient Group:   4th Wednesdays, Contact Corwin Levins o If you are interested in participating in these online groups with Judson Roch, please contact her directly for how to join those meetings.  Her contact information is [email or web address].  She will send you a link to join the [employer].  (Please note that Corwin Levins , MSW, LCSW, has resigned her position at [facility], but will continue to lead the online groups temporarily) .  Marland Kitchen [place]:  [email or web address] o PD Health at Home continues:  Mindfulness Mondays, Expert Briefing Tuesdays, Wellness Wednesdays, Take Time Thursdays, Fitness Fridays  o Upcoming Webinar:  The Skinny on Skin and Bone Health in Parkinson's.  Wednesday, December 1st at 1 pm o Please check out their website to sign up for emails and see their full online offerings .  Marland Kitchen [place]:  [email or web address]  o Upcoming Webinar:   Steps Closer to Stopping Parkinson's:  2021 Research Review, Thursday, November 18th at 12 noon. o Check out additional information on their website to see their full online offerings .  Marland Kitchen [place]:  [email or web address] o Upcoming Webinar:  Non-Motor Symptom Medications in Parkinson's.  Wednesday, November 10th at 2 pm. o Care Partner Monthly Meetup.  With  Robin Searing [place].  First Tuesday of each month, 2 pm o Check out additional information to Live Well Today on their website .  Marland Kitchen Parkinson and Movement Disorders (PMD) Alliance:  www.pmdalliance.org o [employer] Online:  Online Education Events o Sign up for emails, which are sent weekly to give you updates on programming and online offerings .  Marland Kitchen Parkinson's Association of the [place]:  www.[email or web address] o Information on online support groups, online exercises including Yoga, Parkinson's exercises and more-LOTS of information on links to PD resources and online events o Virtual Support Group through Parkinson's Association of the [place]; next one is scheduled for Wednesday, August 17, 2020 at 2 pm. (These are typically scheduled for the 1st Wednesday of the month at 2 pm).  Visit website for details. .  . Additional links for movement activities: o PWR! Moves Classes at [place] RESUMED, at a limited capacity.  We have several openings for Wednesday 10 am and 11 am classes.  Contact Amy Marriott, PT [email or web address] or [phone] if interested o Here is a link to the PWR!Moves classes on Zoom from [place] - Daily Mon-Sat at 10:00. Via Zoom, FREE and open to all.  There is also a link below via Facebook if you use that platform. - [email or web address].si - [email or web address]://www.[email or web address].no o Parkinson's Wellness Recovery (PWR! Moves)  www.pwr4life.org - Info on the PWR! Virtual Experience:  You will have access to our expertise through self-assessment, guided plans that start with the PD-specific fundamentals, educational content, tips, Q&A with an expert, and a growing Art therapist of PD-specific pre-recorded and live exercise  classes of varying types and  intensity - both physical and cognitive! If that is not enough, we offer 1:1 wellness consultations (in-person or virtual) to personalize your PWR! Research scientist (medical).  - Check out the PWR! Move of the month on the [place] [facility] website:  [email or web address] o [employer] Fridays:  - As part of the PD Health @ Home program, this free video series focuses each week on one aspect of fitness designed to support people living with Parkinson's.  -  HollywoodSale.dk o Dance for PD website is offering free, live-stream classes throughout the week, as well as links to [employer] of classes:  [email or web address] o Transport planner for Parkinson's Class:  [place] is back this Fall!  Free offering for people with Parkinson's and care partners; virtual class this Fall. The class will be Wednesdays 4-5pm beginning 10/13.  Classes will run for 9 weeks 10/13-12/15, with no class on 11/24.  Register below: o [email or web address]?[email or web address]&SEASON=Parkinsons+Dance-[email or web address]  o For more information, contact [phone] or email Ruffin Frederick at [email or web address] o Virtual dance and Pilates for Parkinson's classes: Click on the Community Tab> Parkinson's Movement Initiative Tab.  To register for classes and for more information, visit www.[email or web address] and click the "community" tab.  o [employer] Parkinson's Cycling Classes  - [facility] [employer]: 1pm on Fridays-Live classes at [facility] [employer] at [email or web address] or [phone]) [facility] [employer]: Virtual Classes Mondays and Thursdays (contact [place] at [email or web address] or [phone]) .  o [employer] - Three levels of classes are offered Tuesdays and Thursdays:  10:30 am,   12 noon & 1:45 pm at [employer]. To observe a class or for  more information, call [phone] or email [email or web address] . [employer]: o Chief Technology Officer Opportunities:  www.well-springsolutions.org/caregiver-education/caregiver-support-group.  You may also contact Vickki Muff at [email or web address] or [phone].   o Caregiver Virtual Event:   [facility]Spring is Partnering with [employer], on Friday, November 19th from 11:30-12:30 for a virtual event - Contact 09-28-1998 (above) for details o Well-Spring Navigator:  Just1Navigator program, a free service to help individuals and families through the journey of determining care for older adults.  The "Navigator" is a Vickki Muff, Education officer, museum, who will speak with a prospective client and/or loved ones to provide an assessment of the situation and a set of recommendations for a personalized care plan -- all free of charge, and whether [facility] [employer] offers the needed service or not. If the need is not a service we provide, we are well-connected with reputable programs in town that we can refer you to.  www.well-springsolutions.org or to speak with the Navigator, call [phone].

## 2020-08-17 ENCOUNTER — Other Ambulatory Visit: Payer: Self-pay | Admitting: Neurology

## 2020-08-17 ENCOUNTER — Telehealth: Payer: Self-pay | Admitting: Emergency Medicine

## 2020-08-17 NOTE — Telephone Encounter (Signed)
Loop recoreder at [facility]. Patient  Informed and requested ILR extraction. Will expect call from scheduler to arrange appointment with Dr Allred for extraction.Instricted to unplug remote monitor and return kit will be sent to home of record. 

## 2020-08-17 NOTE — Telephone Encounter (Signed)
Patient called in stating he tried to get a refill on his Klonopin, but he was not able to. He says he thinks because his dosage had been changed from a half to a full tablet that this may be why. He is wondering if a new prescription could be sent or what he should do?

## 2020-08-17 NOTE — Telephone Encounter (Signed)
Spoke with patient and informed him that his rx has been sent over since 9:48am this morning. He voiced understanding and thanked me for calling.

## 2020-08-25 DIAGNOSIS — H52203 Unspecified astigmatism, bilateral: Secondary | ICD-10-CM | POA: Diagnosis not present

## 2020-08-25 DIAGNOSIS — H25813 Combined forms of age-related cataract, bilateral: Secondary | ICD-10-CM | POA: Diagnosis not present

## 2020-08-25 DIAGNOSIS — H02886 Meibomian gland dysfunction of left eye, unspecified eyelid: Secondary | ICD-10-CM | POA: Diagnosis not present

## 2020-08-25 DIAGNOSIS — H02883 Meibomian gland dysfunction of right eye, unspecified eyelid: Secondary | ICD-10-CM | POA: Diagnosis not present

## 2020-08-25 DIAGNOSIS — H524 Presbyopia: Secondary | ICD-10-CM | POA: Diagnosis not present

## 2020-08-25 DIAGNOSIS — H5203 Hypermetropia, bilateral: Secondary | ICD-10-CM | POA: Diagnosis not present

## 2020-09-06 ENCOUNTER — Ambulatory Visit (INDEPENDENT_AMBULATORY_CARE_PROVIDER_SITE_OTHER): Payer: Medicare Other | Admitting: Family Medicine

## 2020-09-06 ENCOUNTER — Encounter: Payer: Self-pay | Admitting: Family Medicine

## 2020-09-06 ENCOUNTER — Other Ambulatory Visit: Payer: Self-pay

## 2020-09-06 VITALS — BP 122/68 | HR 79 | Temp 97.7°F | Resp 16 | Wt 182.0 lb

## 2020-09-06 DIAGNOSIS — R350 Frequency of micturition: Secondary | ICD-10-CM | POA: Diagnosis not present

## 2020-09-06 DIAGNOSIS — R739 Hyperglycemia, unspecified: Secondary | ICD-10-CM | POA: Diagnosis not present

## 2020-09-06 DIAGNOSIS — E785 Hyperlipidemia, unspecified: Secondary | ICD-10-CM | POA: Diagnosis not present

## 2020-09-06 DIAGNOSIS — E349 Endocrine disorder, unspecified: Secondary | ICD-10-CM

## 2020-09-06 DIAGNOSIS — G20 Parkinson's disease: Secondary | ICD-10-CM | POA: Diagnosis not present

## 2020-09-06 DIAGNOSIS — I10 Essential (primary) hypertension: Secondary | ICD-10-CM | POA: Diagnosis not present

## 2020-09-06 DIAGNOSIS — R7989 Other specified abnormal findings of blood chemistry: Secondary | ICD-10-CM

## 2020-09-06 DIAGNOSIS — I63411 Cerebral infarction due to embolism of right middle cerebral artery: Secondary | ICD-10-CM | POA: Diagnosis not present

## 2020-09-06 DIAGNOSIS — N200 Calculus of kidney: Secondary | ICD-10-CM

## 2020-09-06 NOTE — Assessment & Plan Note (Addendum)
Continues to struggle with stones in waves. Has had 4 stones this week but has been able to pass them all. Continue to stay hydrated and follow up with urology. Check UA and culture. Hydrate and report concerns. Better this am than it was last night.

## 2020-09-06 NOTE — Patient Instructions (Signed)
shingrix is the new shingles shot, 2 shots over 2-6 months at pharmacy, confirm coverage with insurance or pharmacist Kidney Stones  Kidney stones are solid, rock-like deposits that form inside of the kidneys. The kidneys are a pair of organs that make urine. A kidney stone may form in a kidney and move into other parts of the urinary tract, including the tubes that connect the kidneys to the bladder (ureters), the bladder, and the tube that carries urine out of the body (urethra). As the stone moves through these areas, it can cause intense pain and block the flow of urine. Kidney stones are created when high levels of certain minerals are found in the urine. The stones are usually passed out of the body through urination, but in some cases, medical treatment may be needed to remove them. What are the causes? Kidney stones may be caused by:  A condition in which certain glands produce too much parathyroid hormone (primary hyperparathyroidism), which causes too much calcium buildup in the blood.  A buildup of uric acid crystals in the bladder (hyperuricosuria). Uric acid is a chemical that the body produces when you eat certain foods. It usually exits the body in the urine.  Narrowing (stricture) of one or both of the ureters.  A kidney blockage that is present at birth (congenital obstruction).  Past surgery on the kidney or the ureters, such as gastric bypass surgery. What increases the risk? The following factors may make you more likely to develop this condition:  Having had a kidney stone in the past.  Having a family history of kidney stones.  Not drinking enough water.  Eating a diet that is high in protein, salt (sodium), or sugar.  Being overweight or obese. What are the signs or symptoms? Symptoms of a kidney stone may include:  Pain in the side of the abdomen, right below the ribs (flank pain). Pain usually spreads (radiates) to the groin.  Needing to urinate frequently  or urgently.  Painful urination.  Blood in the urine (hematuria).  Nausea.  Vomiting.  Fever and chills. How is this diagnosed? This condition may be diagnosed based on:  Your symptoms and medical history.  A physical exam.  Blood tests.  Urine tests. These may be done before and after the stone passes out of your body through urination.  Imaging tests, such as a CT scan, abdominal X-ray, or ultrasound.  A procedure to examine the inside of the bladder (cystoscopy). How is this treated? Treatment for kidney stones depends on the size, location, and makeup of the stones. Kidney stones will often pass out of the body through urination. You may need to:  Increase your fluid intake to help pass the stone. In some cases, you may be given fluids through an IV and may need to be monitored at the hospital.  Take medicine for pain.  Make changes in your diet to help prevent kidney stones from coming back. Sometimes, medical procedures are needed to remove a kidney stone. This may involve:  A procedure to break up kidney stones using: ? A focused beam of light (laser therapy). ? Shock waves (extracorporeal shock wave lithotripsy).  Surgery to remove kidney stones. This may be needed if you have severe pain or have stones that block your urinary tract. Follow these instructions at home: Medicines  Take over-the-counter and prescription medicines only as told by your health care provider.  Ask your health care provider if the medicine prescribed to you requires you to  avoid driving or using heavy machinery. Eating and drinking  Drink enough fluid to keep your urine pale yellow. You may be instructed to drink at least 8-10 glasses of water each day. This will help you pass the kidney stone.  If directed, change your diet. This may include: ? Limiting how much sodium you eat. ? Eating more fruits and vegetables. ? Limiting how much animal protein--such as red meat, poultry,  fish, and eggs--you eat.  Follow instructions from your health care provider about eating or drinking restrictions. General instructions  Collect urine samples as told by your health care provider. You may need to collect a urine sample: ? 24 hours after you pass the stone. ? 8-12 weeks after passing the kidney stone, and every 6-12 months after that.  Strain your urine every time you urinate, for as long as directed. Use the strainer that your health care provider recommends.  Do not throw out the kidney stone after passing it. Keep the stone so it can be tested by your health care provider. Testing the makeup of your kidney stone may help prevent you from getting kidney stones in the future.  Keep all follow-up visits as told by your health care provider. This is important. You may need follow-up X-rays or ultrasounds to make sure that your stone has passed. How is this prevented? To prevent another kidney stone:  Drink enough fluid to keep your urine pale yellow. This is the best way to prevent kidney stones.  Eat a healthy diet and follow recommendations from your health care provider about foods to avoid. You may be instructed to eat a low-protein diet. Recommendations vary depending on the type of kidney stone that you have.  Maintain a healthy weight. Where to find more information  [place] ([facility]): www.kidney.[facility]): www.urologyhealth.org Contact a health care provider if:  You have pain that gets worse or does not get better with medicine. Get help right away if:  You have a fever or chills.  You develop severe pain.  You develop new abdominal pain.  You faint.  You are unable to urinate. Summary  Kidney stones are solid, rock-like deposits that form inside of the kidneys.  Kidney stones can cause nausea, vomiting, blood in the urine, abdominal pain, and the urge to urinate frequently.  Treatment for kidney stones  depends on the size, location, and makeup of the stones. Kidney stones will often pass out of the body through urination.  Kidney stones can be prevented by drinking enough fluids, eating a healthy diet, and maintaining a healthy weight. This information is not intended to replace advice given to you by your health care provider. Make sure you discuss any questions you have with your health care provider. Document Revised: 01/20/2019 Document Reviewed: 01/20/2019 Elsevier Patient Education  [place].

## 2020-09-06 NOTE — Progress Notes (Signed)
Subjective:    Patient ID: Paul Drake, male    DOB: [birth year], 74 y.o.   MRN: [id number]  Chief Complaint  Patient presents with  . Follow-up    HPI Patient is in today for follow up on chronic medical concerns. No recent febrile illness or hospitalizations. He was physically feeling well until kidney stones flared up again this week. He was in pain last night but feels better today. Notes urinary frequency, urgency and back and groin pain. No fevers, chills, dysuria or hematuria. He notes Christmas always causes anhedonia but overall he feels he is managing well. Denies CP/palp/SOB/HA/congestion/fevers/GI c/o. Taking meds as prescribed  Past Medical History:  Diagnosis Date  . Allergic state 12/29/2013  . Arthritis   . Barrett's esophagus   . BPH (benign prostatic hyperplasia)   . Chicken pox   . CVA (cerebral infarction)   . Depression   . GERD (gastroesophageal reflux disease)   . History of kidney stones   . HTN (hypertension), benign 04/01/2015  . Hyperglycemia 01/06/2015  . Hyperlipemia   . Hypertension   . IBS (irritable bowel syndrome)   . Left hand pain 07/21/2017  . Measles as a child  . Medicare annual wellness visit, subsequent 06/26/2015  . Mumps as a child  . Otitis externa 12/10/2013  . Seasonal allergies    some asthma  . Stroke ([place])   . Unspecified asthma(493.90) 12/29/2013    Past Surgical History:  Procedure Laterality Date  . COLONOSCOPY    . EP IMPLANTABLE DEVICE N/A 10/16/2016   Procedure: Loop Recorder Insertion;  Surgeon: Thompson Grayer, MD;  Location: [place] [facility];  Service: Cardiovascular;  Laterality: N/A;  . LITHOTRIPSY     multiple times  . PROSTATE SURGERY  08/2017  . TEE WITHOUT CARDIOVERSION N/A 09/22/2014   Procedure: TRANSESOPHAGEAL ECHOCARDIOGRAM (TEE);  Surgeon: Josue Hector, MD;  Location: [facility];  Service: Cardiovascular;  Laterality: N/A;  . WISDOM TOOTH EXTRACTION      Family History  Problem Relation Age of Onset   . Hypertension Mother   . Hyperlipidemia Mother   . Fibromyalgia Mother   . Arthritis Mother        rheumatoid  . Diabetes Sister        type 2  . Hyperlipidemia Brother   . Hypertension Brother   . Prostate cancer Brother   . Ulcers Father 36       Bleeding Ulcers  . Kidney Stones Daughter   . Asthma Daughter   . Healthy Son   . Cancer Maternal Grandfather        skin ?  . Stroke Maternal Aunt   . Cancer Maternal Uncle        prostate    Social History   Socioeconomic History  . Marital status: Married    Spouse name: Webb Silversmith  . Number of children: 3  . Years of education: college  . Highest education level: Master's degree (e.g., MA, MS, MEng, MEd, MSW, MBA)  Occupational History  . Occupation: retired    Comment: retired  Tobacco Use  . Smoking status: Never Smoker  . Smokeless tobacco: Never Used  Vaping Use  . Vaping Use: Never used  Substance and Sexual Activity  . Alcohol use: Not Currently    Alcohol/week: 1.0 standard drink    Types: 1 Shots of liquor per week  . Drug use: No  . 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 Not on file  Social History Narrative   Patient consumes 3 cups of caffeine daily   Left handed   Lives at home with his wife. They are living in their daughter's house to help to help take care of their granddaughter.      Patient uses a home gym 3-4 x a week.      Social Determinants of Health   Financial Resource Strain: Low Risk   . Difficulty of Paying Living Expenses: Not hard at all  Food Insecurity: No Food Insecurity  . Worried About Charity fundraiser in the Last Year: Never true  . Ran Out of Food in the Last Year: Never true  Transportation Needs: No Transportation Needs  . Lack of Transportation (Medical): No  . Lack of Transportation (Non-Medical): No  Physical Activity: Not on file  Stress: Not on file  Social Connections: Not on  file  Intimate Partner Violence: Not on file    Outpatient Medications Prior to Visit  Medication Sig Dispense Refill  . albuterol (PROVENTIL HFA;VENTOLIN HFA) 108 (90 Base) MCG/ACT inhaler Inhale 2 puffs into the lungs every 6 (six) hours as needed for wheezing or shortness of breath. 1 Inhaler 3  . alfuzosin (UROXATRAL) 10 MG 24 hr tablet Take 10 mg by mouth daily with breakfast.    . ALPRAZolam (XANAX) 0.25 MG tablet Take 1 tablet (0.25 mg total) by mouth 2 (two) times daily as needed for anxiety. 20 tablet 0  . carbidopa-levodopa (SINEMET IR) 25-100 MG tablet Take 1 tablet by mouth 4 (four) times daily. 1 tablet at 7am/10:30am/2pm/5:30 pm 360 tablet 1  . cetirizine (ZYRTEC) 10 MG tablet Take 10 mg by mouth daily.    . clonazePAM (KLONOPIN) 0.5 MG tablet Take 1 tablet (0.5 mg total) by mouth daily. 90 tablet 1  . clopidogrel (PLAVIX) 75 MG tablet TAKE 1 TABLET(75 MG) BY MOUTH DAILY 90 tablet 3  . cyclobenzaprine (FLEXERIL) 10 MG tablet Take 0.5-1 tablets (5-10 mg total) by mouth 3 (three) times daily as needed for muscle spasms. 21 tablet 0  . famotidine (PEPCID) 40 MG tablet Take 1 tablet (40 mg total) by mouth at bedtime as needed for heartburn or indigestion. 30 tablet 5  . finasteride (PROSCAR) 5 MG tablet Take 5 mg by mouth daily.    . fluticasone (FLONASE) 50 MCG/ACT nasal spray SHAKE LIQUID AND USE 2 SPRAYS IN EACH NOSTRIL DAILY 16 g 6  . gabapentin (NEURONTIN) 100 MG capsule TAKE 2 CAPSULES(200 MG) BY MOUTH THREE TIMES DAILY (Patient taking differently: TAKE 2 CAPSULES(200 MG) BY MOUTH THREE TIMES DAILY Patient take 1 tablet q 4-6 hours) 540 capsule 1  . Menthol, Topical Analgesic, (BIOFREEZE EX) Apply 1 application topically daily as needed (hand pain).    . Multiple Vitamins-Minerals (MULTIVITAMINS THER. W/MINERALS) TABS tablet Take 1 tablet by mouth daily.    . NONFORMULARY OR COMPOUNDED ITEM Neuropathic cream - apply 1 to 2 g to left hand 3-4 times daily as needed 1 each 3  .  Omega-3 Fatty Acids (OMEGA-3 PO) Take 350 mg by mouth daily.     . pantoprazole (PROTONIX) 40 MG tablet Take 1 tablet (40 mg total) by mouth daily. 90 tablet 3  . simvastatin (ZOCOR) 20 MG tablet TAKE 1 TABLET BY MOUTH DAILY 90 tablet 1  . venlafaxine XR (EFFEXOR-XR) 150 MG 24 hr capsule Take 1 capsule (150 mg total) by mouth at bedtime. 90 capsule 1  No facility-administered medications prior to visit.    Allergies  Allergen Reactions  . Molds & Smuts Other (See Comments)  . Pollen Extract Other (See Comments)    Runny nose,itchy eyes (trees, grasses, etc)     Review of Systems  Constitutional: Positive for malaise/fatigue. Negative for fever.  HENT: Negative for congestion.   Eyes: Negative for blurred vision and double vision.  Respiratory: Negative for shortness of breath.   Cardiovascular: Negative for chest pain, palpitations and leg swelling.  Gastrointestinal: Negative for abdominal pain, blood in stool and nausea.  Genitourinary: Positive for frequency and urgency. Negative for dysuria, flank pain and hematuria.  Musculoskeletal: Positive for back pain. Negative for falls.  Skin: Negative for rash.  Neurological: Positive for tremors and weakness. Negative for dizziness, loss of consciousness and headaches.  Endo/Heme/Allergies: Negative for environmental allergies.  Psychiatric/Behavioral: Positive for depression. The patient is nervous/anxious.        Objective:    Physical Exam Vitals and nursing note reviewed.  Constitutional:      General: He is not in acute distress.    Appearance: He is well-developed and well-nourished.  HENT:     Head: Normocephalic and atraumatic.     Nose: Nose normal.  Eyes:     General:        Right eye: No discharge.        Left eye: No discharge.  Cardiovascular:     Rate and Rhythm: Normal rate and regular rhythm.     Heart sounds: No murmur heard.   Pulmonary:     Effort: Pulmonary effort is normal.     Breath sounds:  Normal breath sounds.  Abdominal:     General: Bowel sounds are normal.     Palpations: Abdomen is soft.     Tenderness: There is no abdominal tenderness.  Musculoskeletal:        General: No edema.     Cervical back: Normal range of motion and neck supple.  Skin:    General: Skin is warm and dry.  Neurological:     Mental Status: He is alert and oriented to person, place, and time.  Psychiatric:        Mood and Affect: Mood and affect normal.     BP 122/68   Pulse 79   Temp 97.7 F (36.5 C) (Oral)   Resp 16   Wt 182 lb (82.6 kg)   SpO2 97%   BMI 28.51 kg/m  Wt Readings from Last 3 Encounters:  09/06/20 182 lb (82.6 kg)  08/09/20 179 lb (81.2 kg)  07/25/20 179 lb 2 oz (81.3 kg)    Diabetic Foot Exam - Simple   No data filed    Lab Results  Component Value Date   WBC 5.7 06/23/2020   HGB 12.6 (L) 06/23/2020   HCT 37.7 (L) 06/23/2020   PLT 205 06/23/2020   GLUCOSE 108 (H) 07/04/2020   CHOL 131 06/23/2020   TRIG 72 06/23/2020   HDL 53 06/23/2020   LDLDIRECT 76.0 12/31/2014   LDLCALC 63 06/23/2020   ALT 8 (L) 07/04/2020   AST 13 07/04/2020   NA 139 07/04/2020   K 4.4 07/04/2020   CL 104 07/04/2020   CREATININE 1.38 (H) 07/04/2020   BUN 25 07/04/2020   CO2 28 07/04/2020   TSH 1.26 06/23/2020   INR 0.98 09/20/2014   HGBA1C 5.5 06/23/2020    Lab Results  Component Value Date   TSH 1.26 06/23/2020   Lab Results  Component Value Date   WBC 5.7 06/23/2020   HGB 12.6 (L) 06/23/2020   HCT 37.7 (L) 06/23/2020   MCV 95.4 06/23/2020   PLT 205 06/23/2020   Lab Results  Component Value Date   NA 139 07/04/2020   K 4.4 07/04/2020   CO2 28 07/04/2020   GLUCOSE 108 (H) 07/04/2020   BUN 25 07/04/2020   CREATININE 1.38 (H) 07/04/2020   BILITOT 0.5 07/04/2020   ALKPHOS 60 02/08/2020   AST 13 07/04/2020   ALT 8 (L) 07/04/2020   PROT 6.3 07/04/2020   ALBUMIN 4.1 02/08/2020   CALCIUM 9.3 07/04/2020   ANIONGAP 7 09/14/2016   GFR 70.27 02/08/2020   Lab  Results  Component Value Date   CHOL 131 06/23/2020   Lab Results  Component Value Date   HDL 53 06/23/2020   Lab Results  Component Value Date   LDLCALC 63 06/23/2020   Lab Results  Component Value Date   TRIG 72 06/23/2020   Lab Results  Component Value Date   CHOLHDL 2.5 06/23/2020   Lab Results  Component Value Date   HGBA1C 5.5 06/23/2020       Assessment & Plan:   Problem List Items Addressed This Visit    Renal lithiasis    Continues to struggle with stones in waves. Has had 4 stones this week but has been able to pass them all. Continue to stay hydrated and follow up with urology. Check UA and culture. Hydrate and report concerns. Better this am than it was last night.       Relevant Orders   Urinalysis   Urine Culture   Essential hypertension    Well controlled, no changes to meds. Encouraged heart healthy diet such as the DASH diet and exercise as tolerated.       Relevant Orders   CBC   Comprehensive metabolic panel   TSH   Hyperglycemia    hgba1c acceptable, minimize simple carbs. Increase exercise as tolerated.      Relevant Orders   Hemoglobin A1c   HLD (hyperlipidemia)    Tolerating statin, encouraged heart healthy diet, avoid trans fats, minimize simple carbs and saturated fats. Increase exercise as tolerated      Relevant Orders   Lipid panel   Elevated testosterone level in male - Primary   Relevant Orders   Testosterone   PSA   Parkinson's disease ([place])    folowing with neurology and he is managing day to day. When he is stressed and tired it is worse. They adjusted the timing of his Sinemet dosing and that has helped. No falls since last visit. Golf is better. He requests a transfer of care to [facility] Neu[facility] Dr Tat so all of her providers are in the same group.       Relevant Orders   Ambulatory referral to Neurology   Urinary frequency   Relevant Orders   PSA   Urinalysis   Urine Culture      I am having [name]  maintain his multivitamins ther. w/minerals, Omega-3 Fatty Acids (OMEGA-3 PO), albuterol, alfuzosin, cetirizine, (Menthol, Topical Analgesic, (BIOFREEZE EX)), ALPRAZolam, NONFORMULARY OR COMPOUNDED ITEM, simvastatin, gabapentin, fluticasone, clopidogrel, famotidine, venlafaxine XR, pantoprazole, cyclobenzaprine, finasteride, carbidopa-levodopa, and clonazePAM.  No orders of the defined types were placed in this encounter.    Penni Homans, MD

## 2020-09-06 NOTE — Assessment & Plan Note (Signed)
hgba1c acceptable, minimize simple carbs. Increase exercise as tolerated.  

## 2020-09-06 NOTE — Assessment & Plan Note (Signed)
Tolerating statin, encouraged heart healthy diet, avoid trans fats, minimize simple carbs and saturated fats. Increase exercise as tolerated 

## 2020-09-06 NOTE — Assessment & Plan Note (Addendum)
folowing with neurology and he is managing day to day. When he is stressed and tired it is worse. They adjusted the timing of his Sinemet dosing and that has helped. No falls since last visit. Golf is better. He requests a transfer of care to [facility] Neu[facility] Dr Tat so all of her providers are in the same group.

## 2020-09-06 NOTE — Assessment & Plan Note (Signed)
Well controlled, no changes to meds. Encouraged heart healthy diet such as the DASH diet and exercise as tolerated.  °

## 2020-09-08 ENCOUNTER — Other Ambulatory Visit: Payer: Self-pay

## 2020-09-08 ENCOUNTER — Other Ambulatory Visit (INDEPENDENT_AMBULATORY_CARE_PROVIDER_SITE_OTHER): Payer: Medicare Other

## 2020-09-08 DIAGNOSIS — N200 Calculus of kidney: Secondary | ICD-10-CM | POA: Diagnosis not present

## 2020-09-08 DIAGNOSIS — R350 Frequency of micturition: Secondary | ICD-10-CM

## 2020-09-08 LAB — URINALYSIS, ROUTINE W REFLEX MICROSCOPIC
Bilirubin Urine: NEGATIVE
Ketones, ur: NEGATIVE
Leukocytes,Ua: NEGATIVE
Nitrite: NEGATIVE
Specific Gravity, Urine: 1.02 (ref 1.000–1.030)
Total Protein, Urine: 30 — AB
Urine Glucose: NEGATIVE
Urobilinogen, UA: 0.2 (ref 0.0–1.0)
pH: 6 (ref 5.0–8.0)

## 2020-09-08 NOTE — Addendum Note (Signed)
Addended by: Kelle Darting A on: 09/08/2020 10:32 AM   Modules accepted: Orders

## 2020-09-09 LAB — URINE CULTURE
MICRO NUMBER:: 11353106
Result:: NO GROWTH
SPECIMEN QUALITY:: ADEQUATE

## 2020-09-21 ENCOUNTER — Encounter: Payer: Self-pay | Admitting: Internal Medicine

## 2020-09-21 ENCOUNTER — Telehealth: Payer: Self-pay | Admitting: Family Medicine

## 2020-09-21 ENCOUNTER — Other Ambulatory Visit: Payer: Self-pay | Admitting: Family Medicine

## 2020-09-21 ENCOUNTER — Other Ambulatory Visit: Payer: Self-pay

## 2020-09-21 ENCOUNTER — Ambulatory Visit (INDEPENDENT_AMBULATORY_CARE_PROVIDER_SITE_OTHER): Payer: Medicare Other | Admitting: Internal Medicine

## 2020-09-21 VITALS — BP 138/84 | Ht 67.0 in | Wt 185.0 lb

## 2020-09-21 DIAGNOSIS — I63411 Cerebral infarction due to embolism of right middle cerebral artery: Secondary | ICD-10-CM | POA: Diagnosis not present

## 2020-09-21 HISTORY — PX: OTHER SURGICAL HISTORY: SHX169

## 2020-09-21 MED ORDER — GABAPENTIN 100 MG PO CAPS: ORAL_CAPSULE | ORAL | 1 refills | Status: DC

## 2020-09-21 NOTE — Patient Instructions (Signed)
Medication Instructions:  Your physician recommends that you continue on your current medications as directed. Please refer to the Current Medication list given to you today.  Labwork: None ordered.  Testing/Procedures: None ordered.   Your physician wants you to follow-up in: as needed with Dr. Allred.     Implantable Loop Recorder Removal, Care After This sheet gives you information about how to care for yourself after your procedure. Your health care provider may also give you more specific instructions. If you have problems or questions, contact your health care provider. What can I expect after the procedure? After the procedure, it is common to have:  Soreness or discomfort near the incision.  Some swelling or bruising near the incision.  Follow these instructions at home: Incision care  1.  Leave your outer dressing on for 24 hours.  After 24 hours you can remove your outer dressing and shower. 2. Leave adhesive strips in place. These skin closures may need to stay in place for 1-2 weeks. If adhesive strip edges start to loosen and curl up, you may trim the loose edges.  You may remove the strips if they have not fallen off after 2 weeks. 3. Check your incision area every day for signs of infection. Check for: a. Redness, swelling, or pain. b. Fluid or blood. c. Warmth. d. Pus or a bad smell. 4. Do not take baths, swim, or use a hot tub until your incision is completely healed. 5. If your wound site starts to bleed apply pressure.      If you have any questions/concerns please call the device clinic at [phone].  Activity  Return to your normal activities.  Contact a health care provider if:  You have redness, swelling, or pain around your incision.  You have a fever.   

## 2020-09-21 NOTE — Telephone Encounter (Signed)
Patient Dr. Pearlean Brownie will not filled medication due to patient switching doctors.    Medication: gabapentin (NEURONTIN) 100 MG capsule [[id number]]    Has the patient contacted their pharmacy?  (If no, request that the patient contact the pharmacy for the refill.) (If yes, when and what did the pharmacy advise?)  Preferred Pharmacy (with phone number or street name): [id number] - [place], [place] - [address] AT [facility] [address]  [address], [place] [address]  Phone:  [phone] Fax:  [phone]  DEA #:  [id number]  Agent: Please be advised that RX refills may take up to 3 business days. We ask that you follow-up with your pharmacy.

## 2020-09-21 NOTE — Telephone Encounter (Signed)
Please advise if okay to fill in your name.

## 2020-09-21 NOTE — Progress Notes (Signed)
PCP: Mosie Lukes, MD   Primary EP: Dr Rayann Heman  Paul Drake is a 75 y.o. male who presents today for routine electrophysiology followup.  Since last being seen in our clinic, the patient reports doing very well.  Today, he denies symptoms of palpitations, chest pain, shortness of breath,  lower extremity edema, dizziness, presyncope, or syncope.  The patient is otherwise without complaint today.   Past Medical History:  Diagnosis Date  . Allergic state 12/29/2013  . Arthritis   . Barrett's esophagus   . BPH (benign prostatic hyperplasia)   . Chicken pox   . CVA (cerebral infarction)   . Depression   . GERD (gastroesophageal reflux disease)   . History of kidney stones   . HTN (hypertension), benign 04/01/2015  . Hyperglycemia 01/06/2015  . Hyperlipemia   . Hypertension   . IBS (irritable bowel syndrome)   . Left hand pain 07/21/2017  . Measles as a child  . Medicare annual wellness visit, subsequent 06/26/2015  . Mumps as a child  . Otitis externa 12/10/2013  . Seasonal allergies    some asthma  . Stroke ([place])   . Unspecified asthma(493.90) 12/29/2013   Past Surgical History:  Procedure Laterality Date  . COLONOSCOPY    . EP IMPLANTABLE DEVICE N/A 10/16/2016   Procedure: Loop Recorder Insertion;  Surgeon: Thompson Grayer, MD;  Location: [place] [facility];  Service: Cardiovascular;  Laterality: N/A;  . LITHOTRIPSY     multiple times  . PROSTATE SURGERY  08/2017  . TEE WITHOUT CARDIOVERSION N/A 09/22/2014   Procedure: TRANSESOPHAGEAL ECHOCARDIOGRAM (TEE);  Surgeon: Josue Hector, MD;  Location: [facility] ENDOSCOPY;  Service: Cardiovascular;  Laterality: N/A;  . WISDOM TOOTH EXTRACTION      ROS- all systems are reviewed and negatives except as per HPI above  Current Outpatient Medications  Medication Sig Dispense Refill  . albuterol (PROVENTIL HFA;VENTOLIN HFA) 108 (90 Base) MCG/ACT inhaler Inhale 2 puffs into the lungs every 6 (six) hours as needed for wheezing or shortness of  breath. 1 Inhaler 3  . alfuzosin (UROXATRAL) 10 MG 24 hr tablet Take 10 mg by mouth daily with breakfast.    . ALPRAZolam (XANAX) 0.25 MG tablet Take 1 tablet (0.25 mg total) by mouth 2 (two) times daily as needed for anxiety. 20 tablet 0  . carbidopa-levodopa (SINEMET IR) 25-100 MG tablet Take 1 tablet by mouth 4 (four) times daily. 1 tablet at 7am/10:30am/2pm/5:30 pm 360 tablet 1  . cetirizine (ZYRTEC) 10 MG tablet Take 10 mg by mouth daily.    . clonazePAM (KLONOPIN) 0.5 MG tablet Take 1 tablet (0.5 mg total) by mouth daily. 90 tablet 1  . clopidogrel (PLAVIX) 75 MG tablet TAKE 1 TABLET(75 MG) BY MOUTH DAILY 90 tablet 3  . cyclobenzaprine (FLEXERIL) 10 MG tablet Take 0.5-1 tablets (5-10 mg total) by mouth 3 (three) times daily as needed for muscle spasms. 21 tablet 0  . famotidine (PEPCID) 40 MG tablet Take 1 tablet (40 mg total) by mouth at bedtime as needed for heartburn or indigestion. 30 tablet 5  . finasteride (PROSCAR) 5 MG tablet Take 5 mg by mouth daily.    . fluticasone (FLONASE) 50 MCG/ACT nasal spray SHAKE LIQUID AND USE 2 SPRAYS IN EACH NOSTRIL DAILY 16 g 6  . gabapentin (NEURONTIN) 100 MG capsule TAKE 2 CAPSULES(200 MG) BY MOUTH THREE TIMES DAILY (Patient taking differently: TAKE 2 CAPSULES(200 MG) BY MOUTH THREE TIMES DAILY Patient take 1 tablet q 4-6 hours) 540  capsule 1  . Menthol, Topical Analgesic, (BIOFREEZE EX) Apply 1 application topically daily as needed (hand pain).    . Multiple Vitamins-Minerals (MULTIVITAMINS THER. W/MINERALS) TABS tablet Take 1 tablet by mouth daily.    . NONFORMULARY OR COMPOUNDED ITEM Neuropathic cream - apply 1 to 2 g to left hand 3-4 times daily as needed 1 each 3  . Omega-3 Fatty Acids (OMEGA-3 PO) Take 350 mg by mouth daily.     . pantoprazole (PROTONIX) 40 MG tablet Take 1 tablet (40 mg total) by mouth daily. 90 tablet 3  . simvastatin (ZOCOR) 20 MG tablet TAKE 1 TABLET BY MOUTH DAILY 90 tablet 1  . venlafaxine XR (EFFEXOR-XR) 150 MG 24 hr  capsule Take 1 capsule (150 mg total) by mouth at bedtime. 90 capsule 1   No current facility-administered medications for this visit.    Physical Exam: Vitals:   09/21/20 1026  BP: 138/84  SpO2: 96%  Weight: 185 lb (83.9 kg)  Height: 5\' 7"  (1.702 m)    GEN- The patient is well appearing, alert and oriented x 3 today.   Head- normocephalic, atraumatic Eyes-  Sclera clear, conjunctiva pink Ears- hearing intact Oropharynx- clear Lungs- Clear to ausculation bilaterally, normal work of breathing Heart- Regular rate and rhythm, no murmurs, rubs or gallops, PMI not laterally displaced GI- soft, NT, ND, + BS Extremities- no clubbing, cyanosis, or edema  Wt Readings from Last 3 Encounters:  09/21/20 185 lb (83.9 kg)  09/06/20 182 lb (82.6 kg)  08/09/20 179 lb (81.2 kg)    EKG tracing ordered today is personally reviewed and shows sinus  Assessment and Plan:  1. Cryptogenic stroke His ILR has reached ERI.  He wishes to have this removed. Risks and benefits to ILR removal were discussed with the patient who wishes to proceed at this time.  He understands that risks include but are not limited to bleeding and infection.  2. HTN Stable No change required today  3. HL Continue zocor  Return as needed  08/11/20 MD, [facility] 09/21/2020 10:43 AM    PROCEDURES:   1. Implantable loop recorder explantation       DESCRIPTION OF PROCEDURE:  Informed written consent was obtained.  The patient required no sedation for the procedure today.   The patients left chest was therefore prepped and draped in the usual sterile fashion.  The skin overlying the ILR monitor was infiltrated with lidocaine for local analgesia.  A 0.5-cm incision was made over the site.  The previously implanted ILR was exposed and removed using a combination of sharp and blunt dissection.  Steri- Strips and a sterile dressing were then applied. EBL<40ml.  There were no early apparent complications.     CONCLUSIONS:    1. Successful explantation of a [employer] Reveal LINQ implantable loop recorder   2. No early apparent complications.        0m MD, [facility] 09/21/2020 10:44 AM

## 2020-09-21 NOTE — Telephone Encounter (Signed)
Sent medication to pharmacy.   

## 2020-10-10 ENCOUNTER — Other Ambulatory Visit: Payer: Self-pay | Admitting: Family Medicine

## 2020-12-22 ENCOUNTER — Other Ambulatory Visit: Payer: Self-pay | Admitting: Family Medicine

## 2021-01-05 ENCOUNTER — Other Ambulatory Visit (INDEPENDENT_AMBULATORY_CARE_PROVIDER_SITE_OTHER): Payer: Medicare Other

## 2021-01-05 ENCOUNTER — Other Ambulatory Visit: Payer: Self-pay | Admitting: Family Medicine

## 2021-01-05 ENCOUNTER — Other Ambulatory Visit: Payer: Self-pay

## 2021-01-05 ENCOUNTER — Telehealth: Payer: Self-pay | Admitting: Family Medicine

## 2021-01-05 DIAGNOSIS — R350 Frequency of micturition: Secondary | ICD-10-CM

## 2021-01-05 DIAGNOSIS — R7989 Other specified abnormal findings of blood chemistry: Secondary | ICD-10-CM

## 2021-01-05 DIAGNOSIS — E785 Hyperlipidemia, unspecified: Secondary | ICD-10-CM | POA: Diagnosis not present

## 2021-01-05 DIAGNOSIS — E349 Endocrine disorder, unspecified: Secondary | ICD-10-CM | POA: Diagnosis not present

## 2021-01-05 DIAGNOSIS — R739 Hyperglycemia, unspecified: Secondary | ICD-10-CM

## 2021-01-05 DIAGNOSIS — I10 Essential (primary) hypertension: Secondary | ICD-10-CM | POA: Diagnosis not present

## 2021-01-05 LAB — COMPREHENSIVE METABOLIC PANEL
ALT: 8 U/L (ref 0–53)
AST: 18 U/L (ref 0–37)
Albumin: 4 g/dL (ref 3.5–5.2)
Alkaline Phosphatase: 66 U/L (ref 39–117)
BUN: 18 mg/dL (ref 6–23)
CO2: 30 mEq/L (ref 19–32)
Calcium: 9.1 mg/dL (ref 8.4–10.5)
Chloride: 103 mEq/L (ref 96–112)
Creatinine, Ser: 1.2 mg/dL (ref 0.40–1.50)
GFR: 58.71 mL/min — ABNORMAL LOW (ref >60.00)
Glucose, Bld: 125 mg/dL — ABNORMAL HIGH (ref 70–99)
Potassium: 3.9 mEq/L (ref 3.5–5.1)
Sodium: 140 mEq/L (ref 135–145)
Total Bilirubin: 0.8 mg/dL (ref 0.2–1.2)
Total Protein: 6.4 g/dL (ref 6.0–8.3)

## 2021-01-05 LAB — CBC
HCT: 38.2 % — ABNORMAL LOW (ref 39.0–52.0)
Hemoglobin: 12.8 g/dL — ABNORMAL LOW (ref 13.0–17.0)
MCHC: 33.5 g/dL (ref 30.0–36.0)
MCV: 96.9 fl (ref 78.0–100.0)
Platelets: 151 10*3/uL (ref 150.0–400.0)
RBC: 3.94 Mil/uL — ABNORMAL LOW (ref 4.22–5.81)
RDW: 12.8 % (ref 11.5–15.5)
WBC: 4.7 10*3/uL (ref 4.0–10.5)

## 2021-01-05 LAB — TESTOSTERONE: Testosterone: 819.65 ng/dL (ref 300.00–890.00)

## 2021-01-05 LAB — LIPID PANEL
Cholesterol: 146 mg/dL (ref 0–200)
HDL: 61 mg/dL (ref >39.00)
LDL Cholesterol: 55 mg/dL (ref 0–99)
NonHDL: 84.74
Total CHOL/HDL Ratio: 2
Triglycerides: 150 mg/dL — ABNORMAL HIGH (ref 0.0–149.0)
VLDL: 30 mg/dL (ref 0.0–40.0)

## 2021-01-05 LAB — PSA: PSA: 1.22 ng/mL (ref 0.10–4.00)

## 2021-01-05 LAB — HEMOGLOBIN A1C: Hgb A1c MFr Bld: 5.6 % (ref 4.6–6.5)

## 2021-01-05 LAB — TSH: TSH: 1.93 u[IU]/mL (ref 0.35–4.50)

## 2021-01-05 NOTE — Telephone Encounter (Signed)
Yes he should get the second booster 6 months after last definitely adds better immunity please let him know

## 2021-01-05 NOTE — Telephone Encounter (Signed)
Pt came in office stating wanting to know at his age is it recommended to get the 2nd Booster shot for Covid? Please advise. Pt tel [phone].

## 2021-01-05 NOTE — Telephone Encounter (Signed)
See below

## 2021-01-05 NOTE — Telephone Encounter (Signed)
So see if you can find out what is the med the dentist gave him so we do not duplicate. Is he struggling with congestion and cough? Then we can target treatment

## 2021-01-05 NOTE — Telephone Encounter (Signed)
Pt is aware of instructions. Pt states that he went to the dentist and they notice he had sinus infection, they gave a script for but he is experiencing nasal drainage, sinus pressure and pain. Pt would like a script sent in for this issue.

## 2021-01-06 NOTE — Telephone Encounter (Signed)
Zpak is a fine choice for sinus infection so he should take plan mucinex twice a day with it. Encourage increased rest and hydration, add probiotics, zinc 50 mg and vitamin c 1000 mg daily.

## 2021-01-06 NOTE — Telephone Encounter (Signed)
Patient states he was seeing by Milus Glazier (dentist) she prescribe him z-pak

## 2021-01-06 NOTE — Telephone Encounter (Signed)
Pt is aware of instructions.

## 2021-01-06 NOTE — Telephone Encounter (Signed)
See below but states that he does not have a cough

## 2021-01-06 NOTE — Telephone Encounter (Signed)
Spoke with pt, he didn't know the medication the dentist gave him so he will be calling them and calling back.

## 2021-01-11 DIAGNOSIS — N200 Calculus of kidney: Secondary | ICD-10-CM | POA: Diagnosis not present

## 2021-01-11 DIAGNOSIS — N132 Hydronephrosis with renal and ureteral calculous obstruction: Secondary | ICD-10-CM | POA: Diagnosis not present

## 2021-01-11 DIAGNOSIS — I700 Atherosclerosis of aorta: Secondary | ICD-10-CM | POA: Diagnosis not present

## 2021-01-16 ENCOUNTER — Other Ambulatory Visit: Payer: Self-pay | Admitting: Family Medicine

## 2021-01-16 DIAGNOSIS — N201 Calculus of ureter: Secondary | ICD-10-CM | POA: Diagnosis not present

## 2021-01-19 NOTE — Progress Notes (Signed)
Assessment/Plan:   1.  Parkinsons Disease  -take carbidopa/levodopa 25/100 tid (he was RX qid but feels that he is doing well enough)  -exercise  -We discussed that it used to be thought that levodopa would increase risk of melanoma but now it is believed that Parkinsons itself likely increases risk of melanoma. he is to get regular skin checks.   2.  RBD  -Continue clonazepam 0.5 mg at bedtime   Subjective:   Paul Drake was seen today in follow up for Parkinsons disease.  My previous records were reviewed prior to todays visit as well as outside records available to me. Pt denies falls.  Pt denies lightheadedness, near syncope.  No hallucinations.  Mood has been good.  On clonazepam for RBD.  PDMP reviewed.  I am the only prescriber.  Last filled February 28.  He saw cardiology January 5.  He had his loop recorder removed at that visit.  His exercise has been a bit low bc he has a kidney stone but he finally passed it (he has had many over the years).  Doing better with dreams at bedtime.    Current prescribed movement disorder medications: Carbidopa/levodopa 25/100, 1 tablet 4 times per day (increased last visit to 7 AM/10:30 AM/2 PM/5:30 PM) - pt  Admits still only taking tid as felt better lately so rarely takes tid Clonazepam 0.5 mg at bedtime   ALLERGIES:   Allergies  Allergen Reactions  . Molds & Smuts Other (See Comments)  . Pollen Extract Other (See Comments)    Runny nose,itchy eyes (trees, grasses, etc)     CURRENT MEDICATIONS:  Outpatient Encounter Medications as of 01/23/2021  Medication Sig  . albuterol (PROVENTIL HFA;VENTOLIN HFA) 108 (90 Base) MCG/ACT inhaler Inhale 2 puffs into the lungs every 6 (six) hours as needed for wheezing or shortness of breath.  . alfuzosin (UROXATRAL) 10 MG 24 hr tablet Take 10 mg by mouth daily with breakfast.  . ALPRAZolam (XANAX) 0.25 MG tablet Take 1 tablet (0.25 mg total) by mouth 2 (two) times daily as needed for anxiety.  .  carbidopa-levodopa (SINEMET IR) 25-100 MG tablet Take 1 tablet by mouth 4 (four) times daily. 1 tablet at 7am/10:30am/2pm/5:30 pm (Patient taking differently: Take 1 tablet by mouth in the morning, at noon, and at bedtime. 1 tablet at 7am/10:30am/2pm)  . cetirizine (ZYRTEC) 10 MG tablet Take 10 mg by mouth daily.  . clonazePAM (KLONOPIN) 0.5 MG tablet Take 1 tablet (0.5 mg total) by mouth daily.  . clopidogrel (PLAVIX) 75 MG tablet TAKE 1 TABLET(75 MG) BY MOUTH DAILY  . famotidine (PEPCID) 40 MG tablet TAKE 1 TABLET(40 MG) BY MOUTH AT BEDTIME AS NEEDED FOR HEARTBURN OR INDIGESTION  . finasteride (PROSCAR) 5 MG tablet Take 5 mg by mouth daily.  . fluticasone (FLONASE) 50 MCG/ACT nasal spray SHAKE LIQUID AND USE 2 SPRAYS IN EACH NOSTRIL DAILY  . gabapentin (NEURONTIN) 100 MG capsule 1 cap po q 4 hours  . Menthol, Topical Analgesic, (BIOFREEZE EX) Apply 1 application topically daily as needed (hand pain).  . Multiple Vitamins-Minerals (MULTIVITAMINS THER. W/MINERALS) TABS tablet Take 1 tablet by mouth daily.  . NONFORMULARY OR COMPOUNDED ITEM Neuropathic cream - apply 1 to 2 g to left hand 3-4 times daily as needed  . Omega-3 Fatty Acids (OMEGA-3 PO) Take 350 mg by mouth daily.   . pantoprazole (PROTONIX) 40 MG tablet Take 1 tablet (40 mg total) by mouth daily.  . simvastatin (ZOCOR) 20 MG tablet  TAKE 1 TABLET(20 MG) BY MOUTH DAILY  . cyclobenzaprine (FLEXERIL) 10 MG tablet Take 0.5-1 tablets (5-10 mg total) by mouth 3 (three) times daily as needed for muscle spasms. (Patient not taking: Reported on 01/23/2021)  . venlafaxine XR (EFFEXOR-XR) 150 MG 24 hr capsule Take 1 capsule by mouth at bedtime (Patient not taking: Reported on 01/23/2021)   No facility-administered encounter medications on file as of 01/23/2021.    Objective:   PHYSICAL EXAMINATION:    VITALS:   Vitals:   01/23/21 1351  BP: (!) 148/92  Pulse: 82  SpO2: 98%  Weight: 179 lb (81.2 kg)  Height: 5\' 8"  (1.727 m)    GEN:  The  patient appears stated age and is in NAD. HEENT:  Normocephalic, atraumatic.  The mucous membranes are moist. The superficial temporal arteries are without ropiness or tenderness. CV:  RRR Lungs:  CTAB Neck/HEME:  There are no carotid bruits bilaterally.  Neurological examination:  Orientation: The patient is alert and oriented x3. Cranial nerves: There is good facial symmetry with minimal facial hypomimia. The speech is fluent and clear. Soft palate rises symmetrically and there is no tongue deviation. Hearing is intact to conversational tone. Sensation: Sensation is intact to light touch throughout Motor: Strength is at least antigravity x4.  Movement examination: Tone: There is mild increased tone in the RUE Abnormal movements: RUE tremor only with ambulation Coordination:  There is no decremation with RAM's, with any form of RAMS, including alternating supination and pronation of the forearm, hand opening and closing, finger taps, heel taps and toe taps. Gait and Station: The patient has no difficulty arising out of a deep-seated chair without the use of the hands. The patient's stride length is good with tremor on the RUE.  The patient has a neg pull test.     I have reviewed and interpreted the following labs independently    Chemistry      Component Value Date/Time   NA 140 01/05/2021 0957   K 3.9 01/05/2021 0957   CL 103 01/05/2021 0957   CO2 30 01/05/2021 0957   BUN 18 01/05/2021 0957   CREATININE 1.20 01/05/2021 0957   CREATININE 1.38 (H) 07/04/2020 1004      Component Value Date/Time   CALCIUM 9.1 01/05/2021 0957   ALKPHOS 66 01/05/2021 0957   AST 18 01/05/2021 0957   ALT 8 01/05/2021 0957   BILITOT 0.8 01/05/2021 0957       Lab Results  Component Value Date   WBC 4.7 01/05/2021   HGB 12.8 (L) 01/05/2021   HCT 38.2 (L) 01/05/2021   MCV 96.9 01/05/2021   PLT 151.0 01/05/2021    Lab Results  Component Value Date   TSH 1.93 01/05/2021     Total time  spent on today's visit was 20 minutes, including both face-to-face time and nonface-to-face time.  Time included that spent on review of records (prior notes available to me/labs/imaging if pertinent), discussing treatment and goals, answering patient's questions and coordinating care.  Cc:  Mosie Lukes, MD

## 2021-01-23 ENCOUNTER — Other Ambulatory Visit: Payer: Self-pay

## 2021-01-23 ENCOUNTER — Ambulatory Visit (INDEPENDENT_AMBULATORY_CARE_PROVIDER_SITE_OTHER): Payer: Medicare Other | Admitting: Neurology

## 2021-01-23 ENCOUNTER — Encounter: Payer: Self-pay | Admitting: Neurology

## 2021-01-23 VITALS — BP 148/92 | HR 82 | Ht 68.0 in | Wt 179.0 lb

## 2021-01-23 DIAGNOSIS — I63411 Cerebral infarction due to embolism of right middle cerebral artery: Secondary | ICD-10-CM

## 2021-01-23 DIAGNOSIS — G20 Parkinson's disease: Secondary | ICD-10-CM | POA: Diagnosis not present

## 2021-01-23 DIAGNOSIS — G4752 REM sleep behavior disorder: Secondary | ICD-10-CM

## 2021-01-23 NOTE — Patient Instructions (Signed)
Online Resources for Power over Parkinson's Group May 2022  . Local [place] Online Groups  o Power over Parkinson's Group :   - Power Over Parkinson's Patient Education Group will be Wednesday, May 11th at 2pm via Zoom.   - Upcoming Power over Parkinson's Meetings:  2nd Wednesdays of the month at 2 pm:  June 8th, July 13th - Contact Amy Marriott at [email or web address] if interested in participating in this online group o Parkinson's Care Partners Group:    3rd Mondays, Contact Misty Paladino o Atypical Parkinsonian Patient Group:   4th Wednesdays, Contact Misty Paladino o If you are interested in participating in these online groups with Misty, please contact her directly for how to join those meetings.  Her contact information is [email or web address].   . Parkinson Foundation:  www.[email or web address].org o PD Health at Home continues:  Mindfulness Mondays, Expert Briefing Tuesdays, Wellness Wednesdays, Take Time Thursdays, Fitness Fridays -Listings for May 2022 are on the website o Upcoming Webinar:  Newly Diagnosed Building a Better Life with Parkinson   Wednesday, May 18th @ 1 pm o Register for expert briefings (webinars) at [email or web address] o  Please check out their website to sign up for emails and see their full online offerings  . [employer]:  www.[email or web address]  o Upcoming Webinar:   What's in your DNA, understanding Parkinson's genetics.  Thursday, May 19th @ 12 noon o Check out additional information on their website to see their full online offerings  . [employer] Foundation:  www.davisphinneyfoundation.org o Upcoming Webinar:  Stay tuned o Care Partner Monthly Meetup.  With Connie Carpenter Phinney.  First Tuesday of each month, 2 pm o Joy Breaks:  First Wednesday of each month, 2-3 pm. There will be art, doodling, making, crafting, listening, laughing, stories, and everything in between. No art experience necessary. No supplies  required. Just show up for joy!  Register on their website. o Check out additional information to Live Well Today on their website  . Parkinson and Movement Disorders (PMD) Alliance:  www.pmdalliance.org o [employer]:  Online Education Events o Sign up for emails, which are sent weekly to give you updates on programming and online offerings     . Parkinson's Association of [place]:  www.parkinsonassociation.org o Information on online support groups, education events, and online exercises including Yoga, Parkinson's exercises and more-LOTS of information on links to PD resources and online events o Virtual Support Group through Parkinson's Association of [place]; next one is scheduled for Wednesday, May 4th, 2022 at 2 pm. (These are typically scheduled for the 1st Wednesday of the month at 2 pm).  Visit website for details.  . Additional links for movement activities: o PWR! Moves Classes at [facility] HAVE RESUMED!  Wednesdays 10 and 11 am.  Contact Amy Marriott, PT [email or web address] or [phone] if interested o Here is a link to the PWR!Moves classes on Zoom from [place] Parkinson's [facility] - Daily Mon-Sat at 10:00. Via Zoom, FREE and open to all.  There is also a link below via Facebook if you use that platform. - https://www.[email or web address].org/mpf-programs/exercise-and-movement-activities - https://www.facebook.com/ParkinsonsMI.org/posts/pwr-moves-exercise-class-parkinson-wellness-recovery-online-with-angee-ludwa-pt-/10156827878021813/ o Parkinson's Wellness Recovery (PWR! Moves)  www.pwr4life.org - Info on the PWR! Virtual Experience:  You will have access to our expertise through self-assessment, guided plans that start with the PD-specific fundamentals, educational content, tips, Q&A with an expert, and a growing library of PD-specific pre-recorded and live exercise classes of varying types and intensity - both physical and cognitive! If  that is not enough, we   offer 1:1 wellness consultations (in-person or virtual) to personalize your PWR! Virtual Experience.  - Check out the PWR! Move of the month on the Parkinson Wellness Recovery website:  https://www.pwr4life.org/pwr-move-of-the-month-4/ o Parkinson Foundation Fitness Fridays:  - As part of the PD Health @ Home program, this free video series focuses each week on one aspect of fitness designed to support people living with Parkinson's.  These weekly videos highlight the Parkinson Foundation recent fitness guidelines for people with Parkinson's disease. -  www.parkinson.org/understanding-parkinsons/coronavirus/PD-health-at-home/Fitness-Fridays o Dance for PD website is offering free, live-stream classes throughout the week, as well as links to digital library of classes:  https://danceforparkinsons.org/ o Dance for Parkinson's Class:  [employer] [place].  Free offering for people with Parkinson's and care partners; virtual class.  o For more information, contact [phone] or email Magalli Morana at [email or web address] o Virtual dance and Pilates for Parkinson's classes: Click on the Community Tab> Parkinson's Movement Initiative Tab.  To register for classes and for more information, visit www.americandancefestival.org and click the "community" tab.     o [employer] Parkinson's Cycling Classes  - [employer]: 1pm on Fridays-Live classes at [employer] (Contact Margaret Hazen at [email or web address] or [phone]) - [facility]: Virtual Classes Mondays and Thursdays /Live classes Tuesday, Wednesday and Thursday (contact Marlee at [email or web address]  or [phone])  o [place] [employer] - Three levels of classes are offered Tuesdays and Thursdays:  10:30 am,  12 noon & 1:45 pm at [facility].  - Active Stretching with Maria, New Class starting in March, on Fridays - To observe a class or for  more information,  call [phone] or email [email or web address] . [facility] [employer]: o Online Caregiver Education Opportunities:  www.well-springsolutions.org/caregiver-education/caregiver-support-group.  You may also contact Jodi Kolada at [email or web address] or [phone].   o [facility]! Thursday, May 12th 10:00a-1:30p [place]  [facility], [place] [facility], [address],  . You may contact Jodi Kolada at [email or web address] or [phone].   o [facility] Navigator:  Just1Navigator program, a free service to help individuals and families through the journey of determining care for older adults.  The "Navigator" is a social worker, Nicole Reynolds, who will speak with a prospective client and/or loved ones to provide an assessment of the situation and a set of recommendations for a personalized care plan -- all free of charge, and whether [facility] offers the needed service or not. If the need is not a service we provide, we are well-connected with reputable programs in town that we can refer you to.  www.well-springsolutions.org or to speak with the Navigator, call [phone].     

## 2021-01-24 ENCOUNTER — Ambulatory Visit: Payer: Medicare Other | Attending: Internal Medicine

## 2021-01-24 DIAGNOSIS — Z23 Encounter for immunization: Secondary | ICD-10-CM

## 2021-01-24 NOTE — Progress Notes (Signed)
   Covid-19 Vaccination Clinic  Name:  Paul Drake    MRN: [id number] DOB: 1945-11-27  01/24/2021  [name] was observed post Covid-19 immunization for 15 minutes without incident. He was provided with Vaccine Information Sheet and instruction to access the V-Safe system.   [name] was instructed to call 911 with any severe reactions post vaccine: Marland Kitchen Difficulty breathing  . Swelling of face and throat  . A fast heartbeat  . A bad rash all over body  . Dizziness and weakness   Immunizations Administered    Name Date Dose VIS Date Route   PFIZER Comrnaty(Gray TOP) Covid-19 Vaccine 01/24/2021  1:10 PM 0.3 mL 08/25/2020 Intramuscular   Manufacturer: [place]   Lot: [id number]   [place]: [phone]

## 2021-01-29 ENCOUNTER — Other Ambulatory Visit: Payer: Self-pay

## 2021-01-29 ENCOUNTER — Encounter: Payer: Self-pay | Admitting: Family Medicine

## 2021-01-29 ENCOUNTER — Emergency Department (INDEPENDENT_AMBULATORY_CARE_PROVIDER_SITE_OTHER)
Admission: EM | Admit: 2021-01-29 | Discharge: 2021-01-29 | Disposition: A | Discharge status: Home / Self Care | Payer: Medicare Other | Source: Home / Self Care

## 2021-01-29 DIAGNOSIS — R059 Cough, unspecified: Secondary | ICD-10-CM | POA: Diagnosis not present

## 2021-01-29 DIAGNOSIS — J0100 Acute maxillary sinusitis, unspecified: Secondary | ICD-10-CM | POA: Diagnosis not present

## 2021-01-29 DIAGNOSIS — J309 Allergic rhinitis, unspecified: Secondary | ICD-10-CM

## 2021-01-29 MED ORDER — AMOXICILLIN-POT CLAVULANATE 875-125 MG PO TABS: 1.0000 | ORAL_TABLET | Freq: Two times a day (BID) | ORAL | 0 refills | Status: DC

## 2021-01-29 MED ORDER — FEXOFENADINE HCL 180 MG PO TABS: 180.0000 mg | ORAL_TABLET | Freq: Every day | ORAL | 0 refills | Status: DC

## 2021-01-29 MED ORDER — BENZONATATE 100 MG PO CAPS: 200.0000 mg | ORAL_CAPSULE | Freq: Three times a day (TID) | ORAL | 0 refills | Status: DC | PRN

## 2021-01-29 NOTE — Discharge Instructions (Addendum)
Advised/instructed patient to take medication with food to completion.  Encouraged patient to increase daily water intake while taking medications.  Advised patient may use Tessalon Perles daily, as needed as adjunct for cough.

## 2021-01-29 NOTE — ED Provider Notes (Signed)
[facility]    CSN: [id number] Arrival date & time: 01/29/21  0807      History   Chief Complaint Chief Complaint  Patient presents with  . Cough  . Otalgia    HPI Paul Drake is a 75 y.o. male.   HPI 75 year old male presents with cough, sinus nasal congestion, and otalgia for 5 days.  Past Medical History:  Diagnosis Date  . Allergic state 12/29/2013  . Arthritis   . Barrett's esophagus   . BPH (benign prostatic hyperplasia)   . Chicken pox   . CVA (cerebral infarction)   . Depression   . GERD (gastroesophageal reflux disease)   . History of kidney stones   . HTN (hypertension), benign 04/01/2015  . Hyperglycemia 01/06/2015  . Hyperlipemia   . Hypertension   . IBS (irritable bowel syndrome)   . Left hand pain 07/21/2017  . Measles as a child  . Medicare annual wellness visit, subsequent 06/26/2015  . Mumps as a child  . Otitis externa 12/10/2013  . Seasonal allergies    some asthma  . Stroke ([place])   . Unspecified asthma(493.90) 12/29/2013    Patient Active Problem List   Diagnosis Date Noted  . Urinary frequency 06/27/2020  . Finger pain, left 04/15/2020  . Tinnitus of both ears 02/08/2020  . Headache 02/08/2020  . Parkinson's disease ([place]) 11/29/2019  . Tremor 09/28/2019  . Ganglion cyst of dorsum of left wrist 05/19/2018  . Elevated testosterone level in male 05/19/2018  . REM behavioral disorder 01/27/2018  . Asthma 10/30/2017  . Vertebrobasilar artery syndrome 07/29/2017  . Left hand pain 07/21/2017  . Chronic deep vein thrombosis (DVT) of popliteal vein of right lower extremity ([place]) 01/17/2017  . TIA (transient ischemic attack) 09/26/2016  . HLD (hyperlipidemia) 02/14/2016  . Cerebrovascular accident (CVA) due to embolism of right middle cerebral artery ([place]) 08/15/2015  . Medicare annual wellness visit, subsequent 06/26/2015  . Eustachian tube dysfunction 03/11/2015  . Hyperglycemia 01/06/2015  . Allergic rhinitis 12/15/2014  .  Otitis media 12/15/2014  . Vertigo 12/15/2014  . PFO (patent foramen ovale) 11/24/2014  . Essential hypertension 11/24/2014  . DVT, lower extremity ([place]) 10/04/2014  . Stroke ([place]) 09/20/2014  . Barrett's esophagus 08/16/2014  . Lumbago 08/16/2014  . IBS (irritable bowel syndrome) 08/16/2014  . Abdominal pain 07/06/2014  . Allergic state 12/29/2013  . Unspecified asthma(493.90) 12/29/2013  . GERD (gastroesophageal reflux disease)   . Need for prophylactic vaccination with combined diphtheria-tetanus-pertussis (DTP) vaccine 11/15/2013  . BPH (benign prostatic hyperplasia) 11/15/2013  . Renal lithiasis 11/15/2013  . Depression with anxiety 11/15/2013  . Anosmia 11/15/2013  . History of CVA (cerebrovascular accident) 11/15/2013  . CMC arthritis, thumb, degenerative 11/04/2013    Past Surgical History:  Procedure Laterality Date  . COLONOSCOPY    . EP IMPLANTABLE DEVICE N/A 10/16/2016   Procedure: Loop Recorder Insertion;  Surgeon: Thompson Grayer, MD;  Location: [facility];  Service: Cardiovascular;  Laterality: N/A;  . implantable loop recorder removal  09/21/2020   Reveal LINQ removed in office  . LITHOTRIPSY     multiple times  . PROSTATE SURGERY  08/2017  . TEE WITHOUT CARDIOVERSION N/A 09/22/2014   Procedure: TRANSESOPHAGEAL ECHOCARDIOGRAM (TEE);  Surgeon: Josue Hector, MD;  Location: [facility] ENDOSCOPY;  Service: Cardiovascular;  Laterality: N/A;  . WISDOM TOOTH EXTRACTION         Home Medications    Prior to Admission medications   Medication Sig Start Date End  Date Taking? Authorizing Provider  alfuzosin (UROXATRAL) 10 MG 24 hr tablet Take 10 mg by mouth daily with breakfast.   Yes [provider]  ALPRAZolam (XANAX) 0.25 MG tablet Take 1 tablet (0.25 mg total) by mouth 2 (two) times daily as needed for anxiety. 07/03/19  Yes Mosie Lukes, MD  amoxicillin-clavulanate (AUGMENTIN) 875-125 MG tablet Take 1 tablet by mouth every 12 (twelve) hours. 01/29/21  Yes  Eliezer Lofts, FNP  benzonatate (TESSALON) 100 MG capsule Take 2 capsules (200 mg total) by mouth 3 (three) times daily as needed for up to 7 days for cough. 01/29/21 02/05/21 Yes Eliezer Lofts, FNP  carbidopa-levodopa (SINEMET IR) 25-100 MG tablet Take 1 tablet by mouth 4 (four) times daily. 1 tablet at 7am/10:30am/2pm/5:30 pm Patient taking differently: Take 1 tablet by mouth in the morning, at noon, and at bedtime. 1 tablet at 7am/10:30am/2pm 08/09/20  Yes Tat, Eustace Quail, DO  clonazePAM (KLONOPIN) 0.5 MG tablet Take 1 tablet (0.5 mg total) by mouth daily. 08/17/20  Yes Tat, Eustace Quail, DO  clopidogrel (PLAVIX) 75 MG tablet TAKE 1 TABLET(75 MG) BY MOUTH DAILY 03/29/20  Yes Garvin Fila, MD  famotidine (PEPCID) 40 MG tablet TAKE 1 TABLET(40 MG) BY MOUTH AT BEDTIME AS NEEDED FOR HEARTBURN OR INDIGESTION 12/22/20  Yes Mosie Lukes, MD  fexofenadine [facility] ALLERGY) 180 MG tablet Take 1 tablet (180 mg total) by mouth daily. 01/29/21 02/28/21 Yes Eliezer Lofts, FNP  finasteride (PROSCAR) 5 MG tablet Take 5 mg by mouth daily. 07/15/20  Yes [provider]  fluticasone (FLONASE) 50 MCG/ACT nasal spray SHAKE LIQUID AND USE 2 SPRAYS IN [facility] NOSTRIL DAILY 02/11/20  Yes Mosie Lukes, MD  gabapentin (NEURONTIN) 100 MG capsule 1 cap po q 4 hours 09/21/20  Yes Mosie Lukes, MD  Multiple Vitamins-Minerals (MULTIVITAMINS THER. W/MINERALS) TABS tablet Take 1 tablet by mouth daily.   Yes [provider]  Omega-3 Fatty Acids (OMEGA-3 PO) Take 350 mg by mouth daily.    Yes [provider]  pantoprazole (PROTONIX) 40 MG tablet Take 1 tablet (40 mg total) by mouth daily. 07/15/20  Yes Mosie Lukes, MD  simvastatin (ZOCOR) 20 MG tablet TAKE 1 TABLET(20 MG) BY MOUTH DAILY 01/16/21  Yes Mosie Lukes, MD  albuterol (PROVENTIL HFA;VENTOLIN HFA) 108 (90 Base) MCG/ACT inhaler Inhale 2 puffs into the lungs every 6 (six) hours as needed for wheezing or shortness of breath. Patient not taking:  Reported on 01/29/2021 09/23/18   Mosie Lukes, MD  cyclobenzaprine (FLEXERIL) 10 MG tablet Take 0.5-1 tablets (5-10 mg total) by mouth 3 (three) times daily as needed for muscle spasms. Patient not taking: Reported on 01/23/2021 07/25/20   Shelda Pal, DO  Menthol, Topical Analgesic, (BIOFREEZE EX) Apply 1 application topically daily as needed (hand pain).    [provider]  NONFORMULARY OR COMPOUNDED ITEM Neuropathic cream - apply 1 to 2 g to left hand 3-4 times daily as needed 08/10/19   Frann Rider, NP  venlafaxine XR (EFFEXOR-XR) 150 MG 24 hr capsule Take 1 capsule by mouth at bedtime Patient not taking: Reported on 01/23/2021 01/05/21   Mosie Lukes, MD    Family History Family History  Problem Relation Age of Onset  . Hypertension Mother   . Hyperlipidemia Mother   . Fibromyalgia Mother   . Arthritis Mother        rheumatoid  . Diabetes Sister        type 2  .  Hyperlipidemia Brother   . Hypertension Brother   . Prostate cancer Brother   . Ulcers Father 36       Bleeding Ulcers  . Kidney Stones Daughter   . Asthma Daughter   . Healthy Son   . Cancer Maternal Grandfather        skin ?  . Stroke Maternal Aunt   . Cancer Maternal Uncle        prostate    Social History Social History   Tobacco Use  . Smoking status: Never Smoker  . Smokeless tobacco: Never Used  Vaping Use  . Vaping Use: Never used  Substance Use Topics  . Alcohol use: Not Currently    Alcohol/week: 1.0 standard drink    Types: 1 Shots of liquor per week  . Drug use: No     Allergies   Molds & smuts and Pollen extract   Review of Systems Review of Systems  Constitutional: Negative.   HENT: Positive for congestion, ear pain and sore throat.   Eyes: Negative.   Respiratory: Positive for cough.   Cardiovascular: Negative.   Gastrointestinal: Negative.   Genitourinary: Negative.   Musculoskeletal: Negative.   Skin: Negative.   Neurological: Negative.       Physical Exam Triage Vital Signs ED Triage Vitals [01/29/21 0817]  Enc Vitals Group     BP 135/80     Pulse Rate 84     Resp 15     Temp 98.7 F (37.1 C)     Temp Source Oral     SpO2 99 %     Weight      Height      Head Circumference      Peak Flow      Pain Score      Pain Loc      Pain Edu?      Excl. in [place]?    No data found.  Updated Vital Signs BP 135/80 (BP Location: Right Arm)   Pulse 84   Temp 98.7 F (37.1 C) (Oral)   Resp 15   SpO2 99%      Physical Exam Vitals and nursing note reviewed.  Constitutional:      General: He is not in acute distress.    Appearance: Normal appearance. He is not ill-appearing.  HENT:     Head: Normocephalic and atraumatic.     Right Ear: Hearing normal. Tympanic membrane is erythematous and bulging.     Left Ear: Hearing normal. Tympanic membrane is retracted.     Nose:     Right Turbinates: Enlarged.     Left Turbinates: Enlarged.     Right Sinus: Maxillary sinus tenderness present.     Left Sinus: Maxillary sinus tenderness present.     Comments: Turbinates are erythematous    Mouth/Throat:     Lips: Pink.     Mouth: Mucous membranes are moist.     Pharynx: Oropharynx is clear. Uvula midline. No pharyngeal swelling, oropharyngeal exudate, posterior oropharyngeal erythema or uvula swelling.     Comments: Moderate clear drainage of posterior oropharynx noted Cardiovascular:     Rate and Rhythm: Normal rate and regular rhythm.     Pulses: Normal pulses.     Heart sounds: Normal heart sounds.  Pulmonary:     Effort: Pulmonary effort is normal. No respiratory distress.     Breath sounds: Normal breath sounds. No wheezing, rhonchi or rales.  Musculoskeletal:     Cervical back: Normal range of  motion and neck supple.  Lymphadenopathy:     Cervical: Cervical adenopathy present.  Skin:    General: Skin is warm and dry.  Neurological:     General: No focal deficit present.     Mental Status: He is alert and  oriented to person, place, and time.  Psychiatric:        Mood and Affect: Mood normal.        Behavior: Behavior normal.      UC Treatments / Results  Labs (all labs ordered are listed, but only abnormal results are displayed) Labs Reviewed - No data to display  EKG   Radiology No results found.  Procedures Procedures (including critical care time)  Medications Ordered in UC Medications - No data to display  Initial Impression / Assessment and Plan / UC Course  I have reviewed the triage vital signs and the nursing notes.  Pertinent labs & imaging results that were available during my care of the patient were reviewed by me and considered in my medical decision making (see chart for details).     MDM: 1.  Acute maxillary sinusitis, 2.  Right otitis media, 3.  Allergic rhinitis.  Patient discharged home, hemodynamically stable. Final Clinical Impressions(s) / UC Diagnoses   Final diagnoses:  Acute maxillary sinusitis, recurrence not specified  Cough  Allergic rhinitis, unspecified seasonality, unspecified trigger     Discharge Instructions     Advised/instructed patient to take medication with food to completion.  Encouraged patient to increase daily water intake while taking medications.  Advised patient may use Tessalon [place] daily, as needed as adjunct for cough.    ED Prescriptions    Medication Sig Dispense Auth. Provider   amoxicillin-clavulanate (AUGMENTIN) 875-125 MG tablet Take 1 tablet by mouth every 12 (twelve) hours. 14 tablet Eliezer Lofts, FNP   fexofenadine [facility] ALLERGY) 180 MG tablet Take 1 tablet (180 mg total) by mouth daily. 30 tablet Eliezer Lofts, FNP   benzonatate (TESSALON) 100 MG capsule Take 2 capsules (200 mg total) by mouth 3 (three) times daily as needed for up to 7 days for cough. 30 capsule Eliezer Lofts, FNP     PDMP not reviewed this encounter.   Eliezer Lofts, [place] 01/29/21 [phone]

## 2021-01-29 NOTE — ED Triage Notes (Signed)
Cough & sinus congestion since Wed Mucinex & tylenol OTC  Runny nose started last night  Ear fullness & sore throat started last night  Chronic season allergies

## 2021-01-30 ENCOUNTER — Telehealth: Payer: Self-pay | Admitting: Family Medicine

## 2021-01-30 ENCOUNTER — Other Ambulatory Visit (HOSPITAL_BASED_OUTPATIENT_CLINIC_OR_DEPARTMENT_OTHER): Payer: Self-pay

## 2021-01-30 ENCOUNTER — Other Ambulatory Visit: Payer: Self-pay | Admitting: Family Medicine

## 2021-01-30 MED ORDER — PFIZER-BIONT COVID-19 VAC-TRIS 30 MCG/0.3ML IM SUSP
INTRAMUSCULAR | 0 refills | Status: DC
  Filled 2021-01-30: qty 0.3, 1d supply, fill #0

## 2021-01-30 MED ORDER — METHYLPREDNISOLONE 4 MG PO TABS: ORAL_TABLET | ORAL | 0 refills | Status: DC

## 2021-01-30 NOTE — Telephone Encounter (Signed)
Pt, stated that he was seeing in the Urgent Care this weekend in [place] a [place] facility. pt, would like as prescription for prednisone he also states he had kidney stone he pass it and now he has inner ear infection and sore throat no fever  Please advice

## 2021-01-30 NOTE — Telephone Encounter (Signed)
Pt is aware of medication being sent in.

## 2021-01-30 NOTE — Telephone Encounter (Signed)
See below

## 2021-01-30 NOTE — Telephone Encounter (Signed)
I sent in a medrol dosepak as his steroid to treat his sinus infection. The Augmentin should be helping his throat and ear as well.

## 2021-02-02 ENCOUNTER — Encounter: Payer: Self-pay | Admitting: Family Medicine

## 2021-02-02 ENCOUNTER — Other Ambulatory Visit: Payer: Self-pay

## 2021-02-02 ENCOUNTER — Ambulatory Visit (INDEPENDENT_AMBULATORY_CARE_PROVIDER_SITE_OTHER): Payer: Medicare Other | Admitting: Family Medicine

## 2021-02-02 VITALS — BP 128/76 | HR 75 | Temp 97.6°F | Resp 16 | Wt 179.0 lb

## 2021-02-02 DIAGNOSIS — R739 Hyperglycemia, unspecified: Secondary | ICD-10-CM

## 2021-02-02 DIAGNOSIS — F418 Other specified anxiety disorders: Secondary | ICD-10-CM | POA: Diagnosis not present

## 2021-02-02 DIAGNOSIS — I63411 Cerebral infarction due to embolism of right middle cerebral artery: Secondary | ICD-10-CM

## 2021-02-02 DIAGNOSIS — G20 Parkinson's disease: Secondary | ICD-10-CM | POA: Diagnosis not present

## 2021-02-02 DIAGNOSIS — N200 Calculus of kidney: Secondary | ICD-10-CM

## 2021-02-02 DIAGNOSIS — Z79899 Other long term (current) drug therapy: Secondary | ICD-10-CM

## 2021-02-02 DIAGNOSIS — I10 Essential (primary) hypertension: Secondary | ICD-10-CM | POA: Diagnosis not present

## 2021-02-02 DIAGNOSIS — J329 Chronic sinusitis, unspecified: Secondary | ICD-10-CM | POA: Diagnosis not present

## 2021-02-02 DIAGNOSIS — E785 Hyperlipidemia, unspecified: Secondary | ICD-10-CM

## 2021-02-02 LAB — URINALYSIS
Bilirubin Urine: NEGATIVE
Ketones, ur: NEGATIVE
Leukocytes,Ua: NEGATIVE
Nitrite: NEGATIVE
Specific Gravity, Urine: 1.02 (ref 1.000–1.030)
Total Protein, Urine: NEGATIVE
Urine Glucose: NEGATIVE
Urobilinogen, UA: 0.2 (ref 0.0–1.0)
pH: 6 (ref 5.0–8.0)

## 2021-02-02 MED ORDER — AMOXICILLIN-POT CLAVULANATE 875-125 MG PO TABS: 1.0000 | ORAL_TABLET | Freq: Two times a day (BID) | ORAL | 0 refills | Status: DC

## 2021-02-02 NOTE — Assessment & Plan Note (Signed)
hgba1c acceptable, minimize simple carbs. Increase exercise as tolerated.  

## 2021-02-02 NOTE — Patient Instructions (Signed)
Sinusitis, Adult Sinusitis is inflammation of your sinuses. Sinuses are hollow spaces in the bones around your face. Your sinuses are located:  Around your eyes.  In the middle of your forehead.  Behind your nose.  In your cheekbones. Mucus normally drains out of your sinuses. When your nasal tissues become inflamed or swollen, mucus can become trapped or blocked. This allows bacteria, viruses, and fungi to grow, which leads to infection. Most infections of the sinuses are caused by a virus. Sinusitis can develop quickly. It can last for up to 4 weeks (acute) or for more than 12 weeks (chronic). Sinusitis often develops after a cold. What are the causes? This condition is caused by anything that creates swelling in the sinuses or stops mucus from draining. This includes:  Allergies.  Asthma.  Infection from bacteria or viruses.  Deformities or blockages in your nose or sinuses.  Abnormal growths in the nose (nasal polyps).  Pollutants, such as chemicals or irritants in the air.  Infection from fungi (rare). What increases the risk? You are more likely to develop this condition if you:  Have a weak body defense system (immune system).  Do a lot of swimming or diving.  Overuse nasal sprays.  Smoke. What are the signs or symptoms? The main symptoms of this condition are pain and a feeling of pressure around the affected sinuses. Other symptoms include:  Stuffy nose or congestion.  Thick drainage from your nose.  Swelling and warmth over the affected sinuses.  Headache.  Upper toothache.  A cough that may get worse at night.  Extra mucus that collects in the throat or the back of the nose (postnasal drip).  Decreased sense of smell and taste.  Fatigue.  A fever.  Sore throat.  Bad breath. How is this diagnosed? This condition is diagnosed based on:  Your symptoms.  Your medical history.  A physical exam.  Tests to find out if your condition is  acute or chronic. This may include: ? Checking your nose for nasal polyps. ? Viewing your sinuses using a device that has a light (endoscope). ? Testing for allergies or bacteria. ? Imaging tests, such as an MRI or CT scan. In rare cases, a bone biopsy may be done to rule out more serious types of fungal sinus disease. How is this treated? Treatment for sinusitis depends on the cause and whether your condition is chronic or acute.  If caused by a virus, your symptoms should go away on their own within 10 days. You may be given medicines to relieve symptoms. They include: ? Medicines that shrink swollen nasal passages (topical intranasal decongestants). ? Medicines that treat allergies (antihistamines). ? A spray that eases inflammation of the nostrils (topical intranasal corticosteroids). ? Rinses that help get rid of thick mucus in your nose (nasal saline washes).  If caused by bacteria, your health care provider may recommend waiting to see if your symptoms improve. Most bacterial infections will get better without antibiotic medicine. You may be given antibiotics if you have: ? A severe infection. ? A weak immune system.  If caused by narrow nasal passages or nasal polyps, you may need to have surgery. Follow these instructions at home: Medicines  Take, use, or apply over-the-counter and prescription medicines only as told by your health care provider. These may include nasal sprays.  If you were prescribed an antibiotic medicine, take it as told by your health care provider. Do not stop taking the antibiotic even if you start   to feel better. Hydrate and humidify  Drink enough fluid to keep your urine pale yellow. Staying hydrated will help to thin your mucus.  Use a cool mist humidifier to keep the humidity level in your home above 50%.  Inhale steam for 10-15 minutes, 3-4 times a day, or as told by your health care provider. You can do this in the bathroom while a hot shower is  running.  Limit your exposure to cool or dry air.   Rest  Rest as much as possible.  Sleep with your head raised (elevated).  Make sure you get enough sleep each night. General instructions  Apply a warm, moist washcloth to your face 3-4 times a day or as told by your health care provider. This will help with discomfort.  Wash your hands often with soap and water to reduce your exposure to germs. If soap and water are not available, use hand sanitizer.  Do not smoke. Avoid being around people who are smoking (secondhand smoke).  Keep all follow-up visits as told by your health care provider. This is important.   Contact a health care provider if:  You have a fever.  Your symptoms get worse.  Your symptoms do not improve within 10 days. Get help right away if:  You have a severe headache.  You have persistent vomiting.  You have severe pain or swelling around your face or eyes.  You have vision problems.  You develop confusion.  Your neck is stiff.  You have trouble breathing. Summary  Sinusitis is soreness and inflammation of your sinuses. Sinuses are hollow spaces in the bones around your face.  This condition is caused by nasal tissues that become inflamed or swollen. The swelling traps or blocks the flow of mucus. This allows bacteria, viruses, and fungi to grow, which leads to infection.  If you were prescribed an antibiotic medicine, take it as told by your health care provider. Do not stop taking the antibiotic even if you start to feel better.  Keep all follow-up visits as told by your health care provider. This is important. This information is not intended to replace advice given to you by your health care provider. Make sure you discuss any questions you have with your health care provider. Document Revised: 02/03/2018 Document Reviewed: 02/03/2018 Elsevier Patient Education  2021 [employer]  

## 2021-02-02 NOTE — Assessment & Plan Note (Signed)
Encouraged heart healthy diet, increase exercise, avoid trans fats, consider a krill oil cap daily  Tolerating Simvasatatin

## 2021-02-02 NOTE — Assessment & Plan Note (Signed)
Had a bad kidney stone in April that took him 3 weeks to pass but he has passed it now and feels better

## 2021-02-02 NOTE — Assessment & Plan Note (Signed)
Well controlled, no changes to meds. Encouraged heart healthy diet such as the DASH diet and exercise as tolerated.  °

## 2021-02-02 NOTE — Assessment & Plan Note (Signed)
He continues with Dr Tat and is stable and doing well. No changes

## 2021-02-02 NOTE — Assessment & Plan Note (Signed)
He is very frustrated about how sick he ha been but is happy with his stability with the Parkinson's. Continue Venlafaxine at same dose.

## 2021-02-02 NOTE — Progress Notes (Signed)
Patient ID: Paul Drake, male    DOB: Apr 16, 1946  Age: 75 y.o. MRN: [id number]    Subjective:  Subjective  HPI Kito Cuffe presents for office visit today for follow up on recent dx of parkinson's disease and sinus infection. He reports that he has had a kidney stone recently for about 3 weeks that was 6.5 mm in size. He has struggled with passing it, but was eventually able to. He reports that he has had a really bad sinus infection that he had to go to the ED at 01/29/2021. He endorses feeling better with the prednisone that was px to him. He states that he still has coughing and congestion at night. He denies any chest pain, SOB, fever, abdominal pain, cough, chills, sore throat, dysuria, urinary incontinence, back pain, HA, or N/VD. He states that he was told by Dr. Carles Collet his neurologist that his parkinson's symptoms are mild at the moment and that he should continue with regular exercise and eating a healthy diet.   Review of Systems  Constitutional: Negative for chills, fatigue and fever.  HENT: Positive for congestion (secondary to sinus infeciton). Negative for rhinorrhea, sinus pressure, sinus pain and sore throat.   Eyes: Negative for pain.  Respiratory: Positive for cough (secondary to sinus infection). Negative for shortness of breath.   Cardiovascular: Negative for chest pain, palpitations and leg swelling.  Gastrointestinal: Negative for abdominal pain, blood in stool, diarrhea, nausea and vomiting.  Genitourinary: Negative for flank pain, frequency and penile pain.  Musculoskeletal: Negative for back pain.  Neurological: Negative for headaches.    History Past Medical History:  Diagnosis Date  . Allergic state 12/29/2013  . Arthritis   . Barrett's esophagus   . BPH (benign prostatic hyperplasia)   . Chicken pox   . CVA (cerebral infarction)   . Depression   . GERD (gastroesophageal reflux disease)   . History of kidney stones   . HTN (hypertension), benign 04/01/2015  .  Hyperglycemia 01/06/2015  . Hyperlipemia   . Hypertension   . IBS (irritable bowel syndrome)   . Left hand pain 07/21/2017  . Measles as a child  . Medicare annual wellness visit, subsequent 06/26/2015  . Mumps as a child  . Otitis externa 12/10/2013  . Seasonal allergies    some asthma  . Stroke ([place])   . Unspecified asthma(493.90) 12/29/2013    He has a past surgical history that includes Lithotripsy; Wisdom tooth extraction; Colonoscopy; TEE without cardioversion (N/A, 09/22/2014); Cardiac catheterization (N/A, 10/16/2016); Prostate surgery (08/2017); and implantable loop recorder removal (09/21/2020).   His family history includes Arthritis in his mother; Asthma in his daughter; Cancer in his maternal grandfather and maternal uncle; Diabetes in his sister; Fibromyalgia in his mother; Healthy in his son; Hyperlipidemia in his brother and mother; Hypertension in his brother and mother; Kidney Stones in his daughter; Prostate cancer in his brother; Stroke in his maternal aunt; Ulcers (age of onset: 67) in his father.He reports that he has never smoked. He has never used smokeless tobacco. He reports previous alcohol use of about 1.0 standard drink of alcohol per week. He reports that he does not use drugs.  Current Outpatient Medications on File Prior to Visit  Medication Sig Dispense Refill  . alfuzosin (UROXATRAL) 10 MG 24 hr tablet Take 10 mg by mouth daily with breakfast.    . carbidopa-levodopa (SINEMET IR) 25-100 MG tablet Take 1 tablet by mouth 4 (four) times daily. 1 tablet at 7am/10:30am/2pm/5:30 pm (Patient taking differently:  Take 1 tablet by mouth in the morning, at noon, and at bedtime. 1 tablet at 7am/10:30am/2pm) 360 tablet 1  . clonazePAM (KLONOPIN) 0.5 MG tablet Take 1 tablet (0.5 mg total) by mouth daily. 90 tablet 1  . clopidogrel (PLAVIX) 75 MG tablet TAKE 1 TABLET(75 MG) BY MOUTH DAILY 90 tablet 3  . COVID-19 mRNA Vac-TriS, Pfizer, (PFIZER-BIONT COVID-19 VAC-TRIS) SUSP  injection Inject into the muscle. 0.3 mL 0  . famotidine (PEPCID) 40 MG tablet TAKE 1 TABLET(40 MG) BY MOUTH AT BEDTIME AS NEEDED FOR HEARTBURN OR INDIGESTION 30 tablet 5  . fexofenadine (ALLEGRA ALLERGY) 180 MG tablet Take 1 tablet (180 mg total) by mouth daily. 30 tablet 0  . finasteride (PROSCAR) 5 MG tablet Take 5 mg by mouth daily.    . fluticasone (FLONASE) 50 MCG/ACT nasal spray SHAKE LIQUID AND USE 2 SPRAYS IN EACH NOSTRIL DAILY 16 g 6  . gabapentin (NEURONTIN) 100 MG capsule 1 cap po q 4 hours 540 capsule 1  . Menthol, Topical Analgesic, (BIOFREEZE EX) Apply 1 application topically daily as needed (hand pain).    . methylPREDNISolone (MEDROL) 4 MG tablet 5 tab po qd X 1d then 4 tab po qd X 1d then 3 tab po qd X 1d then 2 tab po qd then 1 tab po qd 15 tablet 0  . Multiple Vitamins-Minerals (MULTIVITAMINS THER. W/MINERALS) TABS tablet Take 1 tablet by mouth daily.    . NONFORMULARY OR COMPOUNDED ITEM Neuropathic cream - apply 1 to 2 g to left hand 3-4 times daily as needed 1 each 3  . Omega-3 Fatty Acids (OMEGA-3 PO) Take 350 mg by mouth daily.     . pantoprazole (PROTONIX) 40 MG tablet Take 1 tablet (40 mg total) by mouth daily. 90 tablet 3  . simvastatin (ZOCOR) 20 MG tablet TAKE 1 TABLET(20 MG) BY MOUTH DAILY 90 tablet 1  . albuterol (PROVENTIL HFA;VENTOLIN HFA) 108 (90 Base) MCG/ACT inhaler Inhale 2 puffs into the lungs every 6 (six) hours as needed for wheezing or shortness of breath. (Patient not taking: No sig reported) 1 Inhaler 3  . cyclobenzaprine (FLEXERIL) 10 MG tablet Take 0.5-1 tablets (5-10 mg total) by mouth 3 (three) times daily as needed for muscle spasms. (Patient not taking: No sig reported) 21 tablet 0  . venlafaxine XR (EFFEXOR-XR) 150 MG 24 hr capsule Take 1 capsule by mouth at bedtime (Patient not taking: No sig reported) 90 capsule 0   No current facility-administered medications on file prior to visit.     Objective:  Objective  Physical Exam Constitutional:       General: He is not in acute distress.    Appearance: Normal appearance. He is not ill-appearing or toxic-appearing.  HENT:     Head: Normocephalic and atraumatic.     Right Ear: Tympanic membrane, ear canal and external ear normal. There is no impacted cerumen.     Left Ear: Tympanic membrane, ear canal and external ear normal. There is no impacted cerumen.     Nose: No congestion or rhinorrhea.  Eyes:     Extraocular Movements: Extraocular movements intact.     Pupils: Pupils are equal, round, and reactive to light.  Cardiovascular:     Rate and Rhythm: Normal rate and regular rhythm.     Pulses: Normal pulses.     Heart sounds: Normal heart sounds. No murmur heard.   Pulmonary:     Effort: Pulmonary effort is normal. No respiratory distress.  Breath sounds: Normal breath sounds. No wheezing, rhonchi or rales.  Abdominal:     General: Bowel sounds are normal.     Palpations: Abdomen is soft. There is no mass.     Tenderness: There is no abdominal tenderness. There is no guarding.     Hernia: No hernia is present.  Musculoskeletal:        General: Normal range of motion.     Cervical back: Normal range of motion and neck supple.  Skin:    General: Skin is warm and dry.  Neurological:     Mental Status: He is alert and oriented to person, place, and time.  Psychiatric:        Behavior: Behavior normal.    BP 128/76   Pulse 75   Temp 97.6 F (36.4 C)   Resp 16   Wt 179 lb (81.2 kg)   SpO2 97%   BMI 27.22 kg/m  Wt Readings from Last 3 Encounters:  02/02/21 179 lb (81.2 kg)  01/23/21 179 lb (81.2 kg)  09/21/20 185 lb (83.9 kg)     Lab Results  Component Value Date   WBC 4.7 01/05/2021   HGB 12.8 (L) 01/05/2021   HCT 38.2 (L) 01/05/2021   PLT 151.0 01/05/2021   GLUCOSE 125 (H) 01/05/2021   CHOL 146 01/05/2021   TRIG 150.0 (H) 01/05/2021   HDL 61.00 01/05/2021   LDLDIRECT 76.0 12/31/2014   LDLCALC 55 01/05/2021   ALT 8 01/05/2021   AST 18 01/05/2021    NA 140 01/05/2021   K 3.9 01/05/2021   CL 103 01/05/2021   CREATININE 1.20 01/05/2021   BUN 18 01/05/2021   CO2 30 01/05/2021   TSH 1.93 01/05/2021   PSA 1.22 01/05/2021   INR 0.98 09/20/2014   HGBA1C 5.6 01/05/2021    No results found.   Assessment & Plan:  Plan    Meds ordered this encounter  Medications  . amoxicillin-clavulanate (AUGMENTIN) 875-125 MG tablet    Sig: Take 1 tablet by mouth every 12 (twelve) hours.    Dispense:  14 tablet    Refill:  0    Problem List Items Addressed This Visit    Renal lithiasis    Had a bad kidney stone in April that took him 3 weeks to pass but he has passed it now and feels better      Relevant Orders   Urinalysis (Completed)   Depression with anxiety    He is very frustrated about how sick he ha been but is happy with his stability with the Parkinson's. Continue Venlafaxine at same dose.       Essential hypertension    Well controlled, no changes to meds. Encouraged heart healthy diet such as the DASH diet and exercise as tolerated.       Relevant Orders   CBC   Comprehensive metabolic panel   Lipid panel   TSH   Hyperglycemia    hgba1c acceptable, minimize simple carbs. Increase exercise as tolerated.       Relevant Orders   Hemoglobin A1c   CBC   HLD (hyperlipidemia)    Encouraged heart healthy diet, increase exercise, avoid trans fats, consider a krill oil cap daily  Tolerating Simvasatatin      Relevant Orders   Lipid panel   Parkinson's disease ([place])    He continues with Dr Tat and is stable and doing well. No changes      Sinusitis    Had a bad  sinus infection a couple of weeks ago and he went to urgent care and they gave him Augmentin and then we gave him Medrol and he is improving. He is encouraged to rest and drink fluids and see how he does off of meds      Relevant Medications   amoxicillin-clavulanate (AUGMENTIN) 875-125 MG tablet    Other Visit Diagnoses    High risk medication use    -   Primary   Relevant Orders   DRUG MONITORING, PANEL 8 WITH CONFIRMATION, URINE (Completed)      Follow-up: Return in about 4 months (around 06/05/2021), or 9/19 lab appt and f/u after that.   I,David Hanna,acting as a scribe for Penni Homans, MD.,have documented all relevant documentation on the behalf of Penni Homans, MD,as directed by  Penni Homans, MD while in the presence of Penni Homans, MD.  I, Mosie Lukes, MD personally performed the services described in this documentation. All medical record entries made by the scribe were at my direction and in my presence. I have reviewed the chart and agree that the record reflects my personal performance and is accurate and complete

## 2021-02-02 NOTE — Assessment & Plan Note (Signed)
Had a bad sinus infection a couple of weeks ago and he went to urgent care and they gave him Augmentin and then we gave him Medrol and he is improving. He is encouraged to rest and drink fluids and see how he does off of meds

## 2021-02-05 LAB — DM TEMPLATE

## 2021-02-05 LAB — DRUG MONITORING, PANEL 8 WITH CONFIRMATION, URINE
6 Acetylmorphine: NEGATIVE ng/mL (ref <10)
Alcohol Metabolites: POSITIVE ng/mL — AB
Amphetamines: NEGATIVE ng/mL (ref <500)
Benzodiazepines: NEGATIVE ng/mL (ref <100)
Buprenorphine, Urine: NEGATIVE ng/mL (ref <5)
Cocaine Metabolite: NEGATIVE ng/mL (ref <150)
Creatinine: 103.5 mg/dL
Ethyl Glucuronide (ETG): 10107 ng/mL — ABNORMAL HIGH (ref <500)
Ethyl Sulfate (ETS): 1959 ng/mL — ABNORMAL HIGH (ref <100)
MDMA: NEGATIVE ng/mL (ref <500)
Marijuana Metabolite: NEGATIVE ng/mL (ref <20)
Opiates: NEGATIVE ng/mL (ref <100)
Oxidant: NEGATIVE ug/mL
Oxycodone: NEGATIVE ng/mL (ref <100)
pH: 5.9 (ref 4.5–9.0)

## 2021-02-08 DIAGNOSIS — N201 Calculus of ureter: Secondary | ICD-10-CM | POA: Diagnosis not present

## 2021-02-17 ENCOUNTER — Other Ambulatory Visit: Payer: Self-pay

## 2021-02-17 ENCOUNTER — Telehealth: Payer: Self-pay | Admitting: Family Medicine

## 2021-02-17 DIAGNOSIS — J45909 Unspecified asthma, uncomplicated: Secondary | ICD-10-CM

## 2021-02-17 MED ORDER — ALBUTEROL SULFATE HFA 108 (90 BASE) MCG/ACT IN AERS: 2.0000 | INHALATION_SPRAY | Freq: Four times a day (QID) | RESPIRATORY_TRACT | 3 refills | Status: DC | PRN

## 2021-02-17 NOTE — Telephone Encounter (Signed)
Sent in medication

## 2021-02-17 NOTE — Telephone Encounter (Signed)
Medication: albuterol (PROVENTIL HFA;VENTOLIN HFA) 108 (90 Base) MCG/ACT inhaler       Has the patient contacted their pharmacy? yes (If no, request that the patient contact the pharmacy for the refill.) (If yes, when and what did the pharmacy advise?) rx out of date     Preferred Pharmacy (with phone number or street name): [id number] - [place] - [address]  [address] [place], [place] [address]  Phone:  [phone] Fax:  [phone]      Agent: Please be advised that RX refills may take up to 3 business days. We ask that you follow-up with your pharmacy.

## 2021-02-20 ENCOUNTER — Encounter: Payer: Self-pay | Admitting: Family Medicine

## 2021-02-20 ENCOUNTER — Telehealth: Payer: Self-pay

## 2021-02-20 ENCOUNTER — Other Ambulatory Visit: Payer: Self-pay

## 2021-02-20 ENCOUNTER — Ambulatory Visit: Payer: Medicare Other | Admitting: Family Medicine

## 2021-02-20 ENCOUNTER — Ambulatory Visit (INDEPENDENT_AMBULATORY_CARE_PROVIDER_SITE_OTHER): Payer: Medicare Other | Admitting: Family Medicine

## 2021-02-20 VITALS — BP 138/82 | HR 70 | Temp 98.0°F | Ht 67.5 in | Wt 178.4 lb

## 2021-02-20 DIAGNOSIS — J208 Acute bronchitis due to other specified organisms: Secondary | ICD-10-CM | POA: Diagnosis not present

## 2021-02-20 DIAGNOSIS — H6983 Other specified disorders of Eustachian tube, bilateral: Secondary | ICD-10-CM

## 2021-02-20 DIAGNOSIS — B9689 Other specified bacterial agents as the cause of diseases classified elsewhere: Secondary | ICD-10-CM

## 2021-02-20 MED ORDER — DOXYCYCLINE HYCLATE 100 MG PO TABS: 100.0000 mg | ORAL_TABLET | Freq: Two times a day (BID) | ORAL | 0 refills | Status: DC

## 2021-02-20 MED ORDER — PREDNISONE 20 MG PO TABS: 40.0000 mg | ORAL_TABLET | Freq: Every day | ORAL | 0 refills | Status: AC

## 2021-02-20 NOTE — Patient Instructions (Signed)
Continue to push fluids, practice good hand hygiene, and cover your mouth if you cough.  If you start having fevers, shaking or shortness of breath, seek immediate care.  Let us know if you need anything.  

## 2021-02-20 NOTE — Telephone Encounter (Signed)
Pt already scheduled for appointment.   [facility] - Client Client Site [facility] - Night Physician Penni Homans - MD Contact Type Call Who Is Calling Patient / Member / Family / Caregiver Caller Name [place] Phone Number [phone] Patient Name Paul Drake Patient DOB 28-Dec-1945 Call Type Message Only Information Provided Reason for Call Request to Schedule Office Appointment Initial Comment Caller states he would like to make an apt. Pt has a cough that is not getting better. Pt has ear pain and chest congestion. Patient request to speak to RN No Disp. Time Disposition Final User 02/20/2021 7:41:16 AM General Information Provided Yes Paulita Cradle Call Closed By: Paulita Cradle Transaction Date/Time: 02/20/2021 7:38:54 AM (ET)

## 2021-02-20 NOTE — Progress Notes (Signed)
Chief Complaint  Patient presents with  . Ear Pain  . Cough    Paul Drake here for URI complaints.  Duration: 3 weeks  Associated symptoms: sinus pain, rhinorrhea, ear pain, nocturnal cough, and sore throat Denies: sinus congestion, itchy watery eyes, ear drainage, sore throat, wheezing, shortness of breath, myalgia and fevers Treatment to date: Augmentin, methylpred, Mucinex,  Sick contacts: Yes- wife ill  Past Medical History:  Diagnosis Date  . Allergic state 12/29/2013  . Arthritis   . Barrett's esophagus   . BPH (benign prostatic hyperplasia)   . Chicken pox   . CVA (cerebral infarction)   . Depression   . GERD (gastroesophageal reflux disease)   . History of kidney stones   . HTN (hypertension), benign 04/01/2015  . Hyperglycemia 01/06/2015  . Hyperlipemia   . Hypertension   . IBS (irritable bowel syndrome)   . Left hand pain 07/21/2017  . Measles as a child  . Medicare annual wellness visit, subsequent 06/26/2015  . Mumps as a child  . Otitis externa 12/10/2013  . Seasonal allergies    some asthma  . Stroke ([place])   . Unspecified asthma(493.90) 12/29/2013    BP 138/82 (BP Location: Left Arm, Patient Position: Sitting, Cuff Size: Normal)   Pulse 70   Temp 98 F (36.7 C) (Oral)   Ht 5' 7.5" (1.715 m)   Wt 178 lb 6 oz (80.9 kg)   SpO2 98%   BMI 27.53 kg/m  General: Awake, alert, appears stated age HEENT: AT, [place], ears patent b/l and TM's slightly retracted without fluid, purulence, erythema, bulging, nares patent w/o discharge, pharynx pink and without exudates, MMM, no sinus ttp Neck: No masses or asymmetry Heart: RRR Lungs: CTAB, no accessory muscle use Psych: Age appropriate judgment and insight, normal mood and affect  Acute bacterial bronchitis - Plan: doxycycline (VIBRA-TABS) 100 MG tablet  Dysfunction of both eustachian tubes - Plan: predniSONE (DELTASONE) 20 MG tablet  7 d doxy, 5 d pred 40 mg/d.  Continue to push fluids, practice good hand hygiene,  cover mouth when coughing. F/u prn. If starting to experience fevers, shaking, or shortness of breath, seek immediate care. Pt voiced understanding and agreement to the plan.  [place], DO 02/20/21 3:39 PM

## 2021-02-23 ENCOUNTER — Other Ambulatory Visit: Payer: Self-pay | Admitting: Neurology

## 2021-02-27 ENCOUNTER — Ambulatory Visit (INDEPENDENT_AMBULATORY_CARE_PROVIDER_SITE_OTHER): Payer: Medicare Other | Admitting: Family Medicine

## 2021-02-27 ENCOUNTER — Encounter: Payer: Self-pay | Admitting: Family Medicine

## 2021-02-27 ENCOUNTER — Other Ambulatory Visit: Payer: Self-pay

## 2021-02-27 VITALS — BP 108/72 | HR 80 | Temp 97.4°F | Ht 67.5 in | Wt 180.1 lb

## 2021-02-27 DIAGNOSIS — J0100 Acute maxillary sinusitis, unspecified: Secondary | ICD-10-CM | POA: Diagnosis not present

## 2021-02-27 MED ORDER — AMOXICILLIN-POT CLAVULANATE 875-125 MG PO TABS
1.0000 | ORAL_TABLET | Freq: Two times a day (BID) | ORAL | 0 refills | Status: DC
Start: 2021-02-27 — End: 2021-03-31

## 2021-02-27 MED ORDER — METHYLPREDNISOLONE ACETATE 80 MG/ML IJ SUSP
80.0000 mg | Freq: Once | INTRAMUSCULAR | Status: AC
  Administered 2021-02-27: 80 mg via INTRAMUSCULAR

## 2021-02-27 NOTE — Progress Notes (Signed)
Chief Complaint  Patient presents with   Facial Pain   Sinusitis    Paul Drake here for URI complaints.  Duration: a few weeks; worse over past few nights  Associated symptoms: sinus pain, ear pain, and dental pain Denies: sinus congestion, rhinorrhea, itchy watery eyes, ear drainage, sore throat, wheezing, shortness of breath, loss of taste/smell recently, myalgia, and fevers, cough Treatment to date: has finished doxy, prednisone burst; things were going well until 2 d ago Sick contacts: No  Past Medical History:  Diagnosis Date   Allergic state 12/29/2013   Arthritis    Barrett's esophagus    BPH (benign prostatic hyperplasia)    Chicken pox    CVA (cerebral infarction)    Depression    GERD (gastroesophageal reflux disease)    History of kidney stones    HTN (hypertension), benign 04/01/2015   Hyperglycemia 01/06/2015   Hyperlipemia    Hypertension    IBS (irritable bowel syndrome)    Left hand pain 07/21/2017   Measles as a child   Medicare annual wellness visit, subsequent 06/26/2015   Mumps as a child   Otitis externa 12/10/2013   Seasonal allergies    some asthma   Stroke ([place])    Unspecified asthma(493.90) 12/29/2013    BP 108/72 (BP Location: Left Arm, Patient Position: Sitting, Cuff Size: Normal)   Pulse 80   Temp (!) 97.4 F (36.3 C) (Oral)   Ht 5' 7.5" (1.715 m)   Wt 180 lb 2 oz (81.7 kg)   SpO2 96%   BMI 27.80 kg/m  General: Awake, alert, appears stated age HEENT: AT, [place], ears patent b/l and TM's neg, nares patent w/o discharge, + TTP over the maxillary sinuses bilaterally, pharynx pink and without exudates, MMM Neck: No masses or asymmetry Heart: RRR Lungs: CTAB, no accessory muscle use Psych: Age appropriate judgment and insight, normal mood and affect  Acute maxillary sinusitis, recurrence not specified - Plan: methylPREDNISolone acetate (DEPO-MEDROL) injection 80 mg  Continue to push fluids, practice good hand hygiene.  If he continues to have  issues, would start treating him more for allergies.  Continue Flonase and oral antihistamine.  Will consider adding Singulair versus intranasal antihistamine. F/u prn. If starting to experience fevers, shaking, or shortness of breath, seek immediate care. Pt voiced understanding and agreement to the plan.  [place], DO 02/27/21 11:43 AM

## 2021-02-27 NOTE — Patient Instructions (Signed)
Keep me in the loop if we aren't turning the corner.  OK to use Mucinex.  OK to take Tylenol 1000 mg (2 extra strength tabs) or 975 mg (3 regular strength tabs) every 6 hours as needed.  Continue to push fluids, practice good hand hygiene, and cover your mouth if you cough.  If you start having fevers, shaking or shortness of breath, seek immediate care.  Let us know if you need anything.

## 2021-03-13 ENCOUNTER — Telehealth: Payer: Self-pay | Admitting: Family Medicine

## 2021-03-13 NOTE — Telephone Encounter (Signed)
Pt following up with sinus issues that wendling saw him for  He is not feeling better whatsoever What can we do for him?   Please advise

## 2021-03-14 MED ORDER — MONTELUKAST SODIUM 10 MG PO TABS: 10.0000 mg | ORAL_TABLET | Freq: Every day | ORAL | 3 refills | Status: DC

## 2021-03-14 NOTE — Telephone Encounter (Signed)
Sent in a new med to help, please sched visit w Dr. Charlett Blake or me if not available next week to recheck. Ty.

## 2021-03-14 NOTE — Telephone Encounter (Signed)
Called the patient and he has had singular before and could not tell a difference, but he stated that had been a long time so might work now.  He would like to know if seeing an allergist or ENT would be helpful.  He did say if you think the singular would help he would try it.

## 2021-03-14 NOTE — Telephone Encounter (Signed)
Called the patient informed of PCP instructions. He did agree to try.

## 2021-03-14 NOTE — Telephone Encounter (Signed)
Singulair would be my first option, even though he's been on it before. I want him to stay on the Xyzal and nasal spray. We can consider allergy in future if not turning corner. Ty.

## 2021-03-16 ENCOUNTER — Other Ambulatory Visit: Payer: Self-pay | Admitting: Family Medicine

## 2021-03-28 ENCOUNTER — Telehealth: Payer: Self-pay | Admitting: Family Medicine

## 2021-03-28 NOTE — Telephone Encounter (Signed)
Called and scheduled with DR. Wendling for Friday 03/31/21

## 2021-03-28 NOTE — Telephone Encounter (Signed)
You had saw him couple of times about this last month.  Should he be seen again?

## 2021-03-28 NOTE — Telephone Encounter (Signed)
Please sched him if he is still not better. Ty.

## 2021-03-28 NOTE — Telephone Encounter (Signed)
The patient states he has been dealing with headaches, earaches, sinus pressure, and cough for a couple of months. The patient states he is not better. He was seen by Dr. Nani Ravens a couple of times. The patient would like to know what he should do next.

## 2021-03-30 ENCOUNTER — Emergency Department (HOSPITAL_BASED_OUTPATIENT_CLINIC_OR_DEPARTMENT_OTHER)
Admission: EM | Admit: 2021-03-30 | Discharge: 2021-03-30 | Disposition: A | Discharge status: Home / Self Care | Payer: Medicare Other | Attending: Emergency Medicine | Admitting: Emergency Medicine

## 2021-03-30 ENCOUNTER — Other Ambulatory Visit: Payer: Self-pay

## 2021-03-30 ENCOUNTER — Encounter (HOSPITAL_BASED_OUTPATIENT_CLINIC_OR_DEPARTMENT_OTHER): Payer: Self-pay

## 2021-03-30 ENCOUNTER — Emergency Department (HOSPITAL_BASED_OUTPATIENT_CLINIC_OR_DEPARTMENT_OTHER): Payer: Medicare Other

## 2021-03-30 DIAGNOSIS — S80811A Abrasion, right lower leg, initial encounter: Secondary | ICD-10-CM | POA: Insufficient documentation

## 2021-03-30 DIAGNOSIS — S39012A Strain of muscle, fascia and tendon of lower back, initial encounter: Secondary | ICD-10-CM | POA: Insufficient documentation

## 2021-03-30 DIAGNOSIS — G20 Parkinson's disease: Secondary | ICD-10-CM | POA: Insufficient documentation

## 2021-03-30 DIAGNOSIS — J45909 Unspecified asthma, uncomplicated: Secondary | ICD-10-CM | POA: Insufficient documentation

## 2021-03-30 DIAGNOSIS — W1830XA Fall on same level, unspecified, initial encounter: Secondary | ICD-10-CM | POA: Diagnosis not present

## 2021-03-30 DIAGNOSIS — S3091XA Unspecified superficial injury of lower back and pelvis, initial encounter: Secondary | ICD-10-CM | POA: Diagnosis present

## 2021-03-30 DIAGNOSIS — I10 Essential (primary) hypertension: Secondary | ICD-10-CM | POA: Diagnosis not present

## 2021-03-30 DIAGNOSIS — W19XXXA Unspecified fall, initial encounter: Secondary | ICD-10-CM

## 2021-03-30 DIAGNOSIS — Z79899 Other long term (current) drug therapy: Secondary | ICD-10-CM | POA: Insufficient documentation

## 2021-03-30 DIAGNOSIS — Y9389 Activity, other specified: Secondary | ICD-10-CM | POA: Diagnosis not present

## 2021-03-30 DIAGNOSIS — M25552 Pain in left hip: Secondary | ICD-10-CM | POA: Diagnosis not present

## 2021-03-30 DIAGNOSIS — I878 Other specified disorders of veins: Secondary | ICD-10-CM | POA: Diagnosis not present

## 2021-03-30 MED ORDER — HYDROCODONE-ACETAMINOPHEN 5-325 MG PO TABS: 1.0000 | ORAL_TABLET | Freq: Four times a day (QID) | ORAL | 0 refills | Status: DC | PRN

## 2021-03-30 MED ORDER — HYDROCODONE-ACETAMINOPHEN 5-325 MG PO TABS
2.0000 | ORAL_TABLET | Freq: Once | ORAL | Status: AC
  Administered 2021-03-30: 2 via ORAL
  Filled 2021-03-30: qty 2

## 2021-03-30 NOTE — ED Triage Notes (Signed)
Pt states he fell from standing position ~2 hours PTA-pain to left hip/left lower back-NAD-slow gait with cane to triage

## 2021-03-30 NOTE — Discharge Instructions (Addendum)
1.  You may take Vicodin 1 to 2 tablets every 6 hours as needed for pain. 2.  Apply cool or ice packs to your area of pain about 20 minutes every 2 hours for the next 48 hours. 3.  Use a cane or a walker to limit your direct weightbearing on the left. 4.  Follow-up with your doctor for recheck. 5.  Return to the emergency department if you are finding increasing weakness, new numbness or dysfunction of the leg to suggest significant nerve injury.  You may have some bruising or strain of the sciatic nerve, but if symptoms are worsening or limiting your function, you will need recheck and possible MRI.

## 2021-03-30 NOTE — ED Provider Notes (Signed)
[facility] EMERGENCY DEPARTMENT Provider Note   CSN: [id number] Arrival date & time: 03/30/21  1405     History Chief Complaint  Patient presents with   Paul Drake is a 75 y.o. male.  HPI Patient was cleaning his car from the hatch side.  He was leaning into the vehicle with his weight on the left leg.  Then when he tried to back out of the vehicle his left leg did not come out and kept his weight.  He ended up staggering backwards and falling onto his buttocks.  Patient reports that he has a lot of pain in his buttocks and indicates the SI joint area on the left.  He has been ambulatory since this happened.  He is using his walking poles to help assist in walking but he has not had any inability to bear weight or transition although it is very painful that area.  He tried Tylenol at home without any relief.  He also got a superficial abrasion to the right lower leg in the process.  Patient does take anticoagulants.  He denies hitting his head.  He has not had any headache or neck pain.    Past Medical History:  Diagnosis Date   Allergic state 12/29/2013   Arthritis    Barrett's esophagus    BPH (benign prostatic hyperplasia)    Chicken pox    CVA (cerebral infarction)    Depression    GERD (gastroesophageal reflux disease)    History of kidney stones    HTN (hypertension), benign 04/01/2015   Hyperglycemia 01/06/2015   Hyperlipemia    Hypertension    IBS (irritable bowel syndrome)    Left hand pain 07/21/2017   Measles as a child   Medicare annual wellness visit, subsequent 06/26/2015   Mumps as a child   Otitis externa 12/10/2013   Seasonal allergies    some asthma   Stroke ([place])    Unspecified asthma(493.90) 12/29/2013    Patient Active Problem List   Diagnosis Date Noted   Sinusitis 02/02/2021   Urinary frequency 06/27/2020   Finger pain, left 04/15/2020   Tinnitus of both ears 02/08/2020   Headache 02/08/2020   Parkinson's disease ([place])  11/29/2019   Ganglion cyst of dorsum of left wrist 05/19/2018   Elevated testosterone level in male 05/19/2018   REM behavioral disorder 01/27/2018   Asthma 10/30/2017   Vertebrobasilar artery syndrome 07/29/2017   Left hand pain 07/21/2017   Chronic deep vein thrombosis (DVT) of popliteal vein of right lower extremity ([place]) 01/17/2017   TIA (transient ischemic attack) 09/26/2016   HLD (hyperlipidemia) 02/14/2016   Cerebrovascular accident (CVA) due to embolism of right middle cerebral artery ([place]) 08/15/2015   Medicare annual wellness visit, subsequent 06/26/2015   Eustachian tube dysfunction 03/11/2015   Hyperglycemia 01/06/2015   Allergic rhinitis 12/15/2014   Otitis media 12/15/2014   Vertigo 12/15/2014   PFO (patent foramen ovale) 11/24/2014   Essential hypertension 11/24/2014   DVT, lower extremity ([place]) 10/04/2014   Stroke ([place]) 09/20/2014   Barrett's esophagus 08/16/2014   Lumbago 08/16/2014   IBS (irritable bowel syndrome) 08/16/2014   Abdominal pain 07/06/2014   Allergic state 12/29/2013   Unspecified asthma(493.90) 12/29/2013   GERD (gastroesophageal reflux disease)    Need for prophylactic vaccination with combined diphtheria-tetanus-pertussis (DTP) vaccine 11/15/2013   BPH (benign prostatic hyperplasia) 11/15/2013   Renal lithiasis 11/15/2013   Depression with anxiety 11/15/2013   Anosmia 11/15/2013   History of  CVA (cerebrovascular accident) 11/15/2013   [place] arthritis, thumb, degenerative 11/04/2013    Past Surgical History:  Procedure Laterality Date   COLONOSCOPY     EP IMPLANTABLE DEVICE N/A 10/16/2016   Procedure: Loop Recorder Insertion;  Surgeon: Thompson Grayer, MD;  Location: [facility];  Service: Cardiovascular;  Laterality: N/A;   implantable loop recorder removal  09/21/2020   Reveal LINQ removed in office   LITHOTRIPSY     multiple times   PROSTATE SURGERY  08/2017   TEE WITHOUT CARDIOVERSION N/A 09/22/2014   Procedure: TRANSESOPHAGEAL  ECHOCARDIOGRAM (TEE);  Surgeon: Josue Hector, MD;  Location: [facility] ENDOSCOPY;  Service: Cardiovascular;  Laterality: N/A;   WISDOM TOOTH EXTRACTION         Family History  Problem Relation Age of Onset   Hypertension Mother    Hyperlipidemia Mother    Fibromyalgia Mother    Arthritis Mother        rheumatoid   Diabetes Sister        type 2   Hyperlipidemia Brother    Hypertension Brother    Prostate cancer Brother    Ulcers Father 48       Bleeding Ulcers   Kidney Stones Daughter    Asthma Daughter    Healthy Son    Cancer Maternal Grandfather        skin ?   Stroke Maternal Aunt    Cancer Maternal Uncle        prostate    Social History   Tobacco Use   Smoking status: Never   Smokeless tobacco: Never  Vaping Use   Vaping Use: Never used  Substance Use Topics   Alcohol use: Yes    Alcohol/week: 1.0 standard drink    Types: 1 Shots of liquor per week    Comment: daily   Drug use: No    Home Medications Prior to Admission medications   Medication Sig Start Date End Date Taking? Authorizing Provider  HYDROcodone-acetaminophen (NORCO/VICODIN) 5-325 MG tablet Take 1-2 tablets by mouth every 6 (six) hours as needed for moderate pain or severe pain. 03/30/21  Yes Charlesetta Shanks, MD  albuterol (VENTOLIN HFA) 108 (90 Base) MCG/ACT inhaler Inhale 2 puffs into the lungs every 6 (six) hours as needed for wheezing or shortness of breath. 02/17/21   Mosie Lukes, MD  alfuzosin (UROXATRAL) 10 MG 24 hr tablet Take 10 mg by mouth daily with breakfast.    [provider]  amoxicillin-clavulanate (AUGMENTIN) 875-125 MG tablet Take 1 tablet by mouth 2 (two) times daily. 02/27/21   Shelda Pal, DO  clonazePAM (KLONOPIN) 0.5 MG tablet TAKE 1 TABLET(0.5 MG) BY MOUTH DAILY 02/23/21   Cameron Sprang, MD  clopidogrel (PLAVIX) 75 MG tablet TAKE 1 TABLET(75 MG) BY MOUTH DAILY 03/29/20   Garvin Fila, MD  COVID-19 mRNA Vac-TriS, Pfizer, (PFIZER-BIONT COVID-19 VAC-TRIS)  SUSP injection Inject into the muscle. 01/24/21   Carlyle Basques, MD  cyclobenzaprine (FLEXERIL) 10 MG tablet Take 0.5-1 tablets (5-10 mg total) by mouth 3 (three) times daily as needed for muscle spasms. 07/25/20   Shelda Pal, DO  famotidine (PEPCID) 40 MG tablet TAKE 1 TABLET(40 MG) BY MOUTH AT BEDTIME AS NEEDED FOR HEARTBURN OR INDIGESTION 12/22/20   Mosie Lukes, MD  fexofenadine [facility] ALLERGY) 180 MG tablet Take 1 tablet (180 mg total) by mouth daily. 01/29/21 02/28/21  Eliezer Lofts, FNP  finasteride (PROSCAR) 5 MG tablet Take 5 mg by mouth daily. 07/15/20  [provider]  fluticasone (FLONASE) 50 MCG/ACT nasal spray SHAKE LIQUID AND USE 2 SPRAYS IN [facility] NOSTRIL DAILY 03/16/21   Mosie Lukes, MD  gabapentin (NEURONTIN) 100 MG capsule 1 cap po q 4 hours 09/21/20   Mosie Lukes, MD  Menthol, Topical Analgesic, (BIOFREEZE EX) Apply 1 application topically daily as needed (hand pain).    [provider]  montelukast (SINGULAIR) 10 MG tablet Take 1 tablet (10 mg total) by mouth at bedtime. 03/14/21   Shelda Pal, DO  Multiple Vitamins-Minerals (MULTIVITAMINS THER. W/MINERALS) TABS tablet Take 1 tablet by mouth daily.    [provider]  NONFORMULARY OR COMPOUNDED ITEM Neuropathic cream - apply 1 to 2 g to left hand 3-4 times daily as needed 08/10/19   Frann Rider, NP  Omega-3 Fatty Acids (OMEGA-3 PO) Take 350 mg by mouth daily.     [provider]  pantoprazole (PROTONIX) 40 MG tablet Take 1 tablet (40 mg total) by mouth daily. 07/15/20   Mosie Lukes, MD  simvastatin (ZOCOR) 20 MG tablet TAKE 1 TABLET(20 MG) BY MOUTH DAILY 01/16/21   Mosie Lukes, MD  venlafaxine XR (EFFEXOR-XR) 150 MG 24 hr capsule Take 1 capsule by mouth at bedtime 01/05/21   Mosie Lukes, MD    Allergies    Molds & smuts and Pollen extract  Review of Systems   Review of Systems Constitutional: No fever no chills no malaise Neurologic: No headache  no confusion no imbalance. Physical Exam Updated Vital Signs BP 131/83 (BP Location: Left Arm)   Pulse 80   Temp 98.5 F (36.9 C) (Oral)   Resp 14   Ht 5\' 7"  (1.702 m)   Wt 78 kg   SpO2 99%   BMI 26.94 kg/m   Physical Exam Constitutional:      Comments: Alert nontoxic normal mental status GCS 15  HENT:     Head: Normocephalic and atraumatic.     Mouth/Throat:     Pharynx: Oropharynx is clear.  Eyes:     Extraocular Movements: Extraocular movements intact.  Pulmonary:     Effort: Pulmonary effort is normal.  Abdominal:     General: There is no distension.     Palpations: Abdomen is soft.     Tenderness: There is no abdominal tenderness. There is no guarding.  Musculoskeletal:     Comments: Patient does not have contusion or abrasion to the buttocks.  He does have reproducible tenderness at the SI joint on the left.  No tenderness over the greater trochanters or the iliac wings with compression.  No pain with range of motion deformity or effusions at the knees or ankles.  Patient can transition from lying supine to standing.  This is antalgic but without weakness or dysfunction.  Long linear superficial abrasion to the posterior lower leg on the right.  No active bleeding.  Approximately 20 cm.  Skin:    General: Skin is warm and dry.  Neurological:     General: No focal deficit present.     Mental Status: He is oriented to person, place, and time.  Psychiatric:        Mood and Affect: Mood normal.    ED Results / Procedures / Treatments   Labs (all labs ordered are listed, but only abnormal results are displayed) Labs Reviewed - No data to display  EKG None  Radiology DG Pelvis 1-2 Views  Result Date: 03/30/2021 CLINICAL DATA:  Fall.  Left hip pain.  EXAM: PELVIS - 1-2 VIEW COMPARISON:  None. FINDINGS: Frontal pelvis shows no evidence for an acute fracture. SI joints and symphysis pubis unremarkable. Multiple phleboliths are seen over the lower pelvis. IMPRESSION:  Negative. Electronically Signed   By: Misty Stanley M.D.   On: 03/30/2021 16:09    Procedures Procedures   Medications Ordered in ED Medications  HYDROcodone-acetaminophen (NORCO/VICODIN) 5-325 MG per tablet 2 tablet (2 tablets Oral Given 03/30/21 1641)    ED Course  I have reviewed the triage vital signs and the nursing notes.  Pertinent labs & imaging results that were available during my care of the patient were reviewed by me and considered in my medical decision making (see chart for details).    MDM Rules/Calculators/A&P                          Patient presents with a mechanical fall while cleaning his vehicle.  He is able to stand and ambulate.  He has been ambulating independently.  He does have focal pain at the SI joint and radiating somewhat deeper into the pelvis.  We will proceed with pelvis x-rays.  Clinically does not appear to have any hip fracture.  No head injury associated with this.  X-rays negative at this time.  Patient does take Plavix but currently no visible hematoma formation.  Patient is neurologically intact.  Precautions given for increasing weakness or numbness.  Some symptoms of tingling in the buttock and upper leg consistent with sciatic nerve strain or contusion.  At this time will provide pain control with 1-2 Vicodin every 6 hours, rest and icing with limited weightbearing.  Return precautions reviewed.  Patient has follow-up with PCP tomorrow for other issues but recommend continued monitoring by PCP for response to treatment and any further diagnostic imaging if clinically indicated. Final Clinical Impression(s) / ED Diagnoses Final diagnoses:  Fall  Sacroiliac strain, initial encounter    Rx / DC Orders ED Discharge Orders          Ordered    HYDROcodone-acetaminophen (NORCO/VICODIN) 5-325 MG tablet  Every 6 hours PRN        03/30/21 1656             Charlesetta Shanks, MD 03/30/21 1705

## 2021-03-31 ENCOUNTER — Ambulatory Visit (INDEPENDENT_AMBULATORY_CARE_PROVIDER_SITE_OTHER): Payer: Medicare Other | Admitting: Family Medicine

## 2021-03-31 ENCOUNTER — Encounter: Payer: Self-pay | Admitting: Family Medicine

## 2021-03-31 VITALS — BP 132/84 | HR 71 | Temp 97.5°F | Ht 67.5 in | Wt 175.0 lb

## 2021-03-31 DIAGNOSIS — R059 Cough, unspecified: Secondary | ICD-10-CM | POA: Diagnosis not present

## 2021-03-31 DIAGNOSIS — J3489 Other specified disorders of nose and nasal sinuses: Secondary | ICD-10-CM | POA: Diagnosis not present

## 2021-03-31 MED ORDER — LEVOCETIRIZINE DIHYDROCHLORIDE 5 MG PO TABS: 5.0000 mg | ORAL_TABLET | Freq: Every evening | ORAL | 2 refills | Status: DC

## 2021-03-31 MED ORDER — AZELASTINE HCL 0.1 % NA SOLN: 2.0000 | Freq: Two times a day (BID) | NASAL | 12 refills | Status: DC

## 2021-03-31 NOTE — Progress Notes (Signed)
Chief Complaint  Patient presents with   Fall   Ear Pain   Facial Pain   Cough    Subjective: Patient is a 75 y.o. male here for f/u.  Over the past 2 months the patient has been dealing with intermittent coughing and sinus pressure/pain.  He has failed doxycycline, Augmentin, and several rounds of various steroids.  Most recent Depo-Medrol injection did help.  He has been using albuterol that does help with the cough.  He is compliant with Singulair and Flonase which is offered minimal 8 as well.  He stopped taking Allegra.  He is not having any fevers, wheezing, shortness of breath, sore throat, ear pain/drainage, nausea, vomiting, or diarrhea.  No recent sick contacts.  Past Medical History:  Diagnosis Date   Allergic state 12/29/2013   Arthritis    Barrett's esophagus    BPH (benign prostatic hyperplasia)    Chicken pox    CVA (cerebral infarction)    Depression    GERD (gastroesophageal reflux disease)    History of kidney stones    HTN (hypertension), benign 04/01/2015   Hyperglycemia 01/06/2015   Hyperlipemia    Hypertension    IBS (irritable bowel syndrome)    Left hand pain 07/21/2017   Measles as a child   Medicare annual wellness visit, subsequent 06/26/2015   Mumps as a child   Otitis externa 12/10/2013   Seasonal allergies    some asthma   Stroke ([place])    Unspecified asthma(493.90) 12/29/2013    Objective: BP 132/84   Pulse 71   Temp (!) 97.5 F (36.4 C) (Oral)   Ht 5' 7.5" (1.715 m)   Wt 175 lb (79.4 kg)   SpO2 99%   BMI 27.00 kg/m  General: Awake, appears stated age HEENT: MMM, EOMi, ears negative bilaterally, nares are patent without rhinorrhea, tenderness to palpation over the maxillary sinuses bilaterally Heart: RRR Lungs: CTAB, no rales, wheezes or rhonchi. No accessory muscle use Psych: Age appropriate judgment and insight, normal affect and mood  Assessment and Plan: Sinus pain - Plan: Ambulatory referral to ENT, azelastine (ASTELIN) 0.1 % nasal  spray  Cough  Chronic, uncontrolled.  Add Astelin nasal spray in addition to Xyzal.  Continue Singulair and Flonase.  Refer to ENT for further evaluation.  Continue albuterol as needed for the cough.  If no improvement, will consider adding Flovent and checking a chest x-ray. The patient voiced understanding and agreement to the plan.  [place], DO 03/31/21  9:10 AM

## 2021-03-31 NOTE — Patient Instructions (Signed)
If you do not hear anything about your referral in the next 1-2 weeks, call our office and ask for an update.  Start the pill and spray in addition to everything else.  Send me a message in 2 weeks if no improvement and we will send in an inhaler.   Let us know if you need anything.

## 2021-04-06 ENCOUNTER — Other Ambulatory Visit: Payer: Self-pay | Admitting: Family Medicine

## 2021-04-11 DIAGNOSIS — L570 Actinic keratosis: Secondary | ICD-10-CM | POA: Diagnosis not present

## 2021-04-11 DIAGNOSIS — X32XXXS Exposure to sunlight, sequela: Secondary | ICD-10-CM | POA: Diagnosis not present

## 2021-04-11 DIAGNOSIS — L814 Other melanin hyperpigmentation: Secondary | ICD-10-CM | POA: Diagnosis not present

## 2021-04-11 DIAGNOSIS — Z85828 Personal history of other malignant neoplasm of skin: Secondary | ICD-10-CM | POA: Diagnosis not present

## 2021-04-11 DIAGNOSIS — L820 Inflamed seborrheic keratosis: Secondary | ICD-10-CM | POA: Diagnosis not present

## 2021-04-11 DIAGNOSIS — L821 Other seborrheic keratosis: Secondary | ICD-10-CM | POA: Diagnosis not present

## 2021-04-11 DIAGNOSIS — D1801 Hemangioma of skin and subcutaneous tissue: Secondary | ICD-10-CM | POA: Diagnosis not present

## 2021-04-11 DIAGNOSIS — L603 Nail dystrophy: Secondary | ICD-10-CM | POA: Diagnosis not present

## 2021-04-13 ENCOUNTER — Telehealth: Payer: Self-pay | Admitting: Family Medicine

## 2021-04-13 NOTE — Telephone Encounter (Signed)
Patient states his ENT appointment is not till 04/28/21. He wants something sooner and is wondering if you could help.

## 2021-04-26 ENCOUNTER — Other Ambulatory Visit: Payer: Self-pay | Admitting: Neurology

## 2021-04-26 ENCOUNTER — Telehealth: Payer: Self-pay | Admitting: *Deleted

## 2021-04-26 DIAGNOSIS — I6389 Other cerebral infarction: Secondary | ICD-10-CM

## 2021-04-26 NOTE — Telephone Encounter (Signed)
Received dental surgery clearance from from [facility] Dr. Milta Deiters Lutins (ph: [phone]). Patient may need tooth extraction or only to lift gum back. They wanted to know how to manage his Plavix.  Dr. Krista Blue is work-in MD and reviewed patient's chart. She indicated that patient has a high risk of recurrent cerebral vascular event. She recommended to minimize the patient's time off Plavix according to the surgeon's comfort level.   Signed form faxed and confirmed back to [phone].

## 2021-04-28 DIAGNOSIS — J329 Chronic sinusitis, unspecified: Secondary | ICD-10-CM | POA: Diagnosis not present

## 2021-04-28 DIAGNOSIS — K219 Gastro-esophageal reflux disease without esophagitis: Secondary | ICD-10-CM | POA: Diagnosis not present

## 2021-04-28 DIAGNOSIS — J309 Allergic rhinitis, unspecified: Secondary | ICD-10-CM | POA: Diagnosis not present

## 2021-04-28 DIAGNOSIS — I63411 Cerebral infarction due to embolism of right middle cerebral artery: Secondary | ICD-10-CM | POA: Diagnosis not present

## 2021-04-28 DIAGNOSIS — J3489 Other specified disorders of nose and nasal sinuses: Secondary | ICD-10-CM | POA: Diagnosis not present

## 2021-05-08 ENCOUNTER — Other Ambulatory Visit: Payer: Self-pay

## 2021-05-08 ENCOUNTER — Encounter: Payer: Self-pay | Admitting: Family Medicine

## 2021-05-08 ENCOUNTER — Ambulatory Visit (INDEPENDENT_AMBULATORY_CARE_PROVIDER_SITE_OTHER): Payer: Medicare Other | Admitting: Family Medicine

## 2021-05-08 VITALS — BP 140/80 | HR 79 | Temp 98.5°F | Ht 68.0 in | Wt 176.4 lb

## 2021-05-08 DIAGNOSIS — M67432 Ganglion, left wrist: Secondary | ICD-10-CM | POA: Diagnosis not present

## 2021-05-08 MED ORDER — TRAMADOL HCL 50 MG PO TABS: 50.0000 mg | ORAL_TABLET | Freq: Two times a day (BID) | ORAL | 0 refills | Status: DC | PRN

## 2021-05-08 NOTE — Patient Instructions (Signed)
If you do not hear anything about your referral in the next 1-2 weeks, call our office and ask for an update.  Do not drink alcohol, do any illicit/street drugs, drive or do anything that requires alertness while on this medicine.   OK to take Tylenol 1000 mg (2 extra strength tabs) or 975 mg (3 regular strength tabs) every 6 hours as needed.  Ice/cold pack over area for 10-15 min twice daily.  Let us know if you need anything.

## 2021-05-08 NOTE — Progress Notes (Signed)
Chief Complaint  Patient presents with   Cyst    Cyst on left wrist     Subjective: Patient is a 75 y.o. male here for f/u.  The patient has a several year history of a ganglion cyst on his left wrist.  It is painful when it comes.  He has had it drained and injected in the past which does help for a period of time.  It is recurred over the past several weeks.  He has never seen a hand specialist for this.  He has not been taking anything at home for this pain.  No bruising, redness, fevers.  Past Medical History:  Diagnosis Date   Allergic state 12/29/2013   Arthritis    Barrett's esophagus    BPH (benign prostatic hyperplasia)    Chicken pox    CVA (cerebral infarction)    Depression    GERD (gastroesophageal reflux disease)    History of kidney stones    HTN (hypertension), benign 04/01/2015   Hyperglycemia 01/06/2015   Hyperlipemia    Hypertension    IBS (irritable bowel syndrome)    Left hand pain 07/21/2017   Measles as a child   Medicare annual wellness visit, subsequent 06/26/2015   Mumps as a child   Otitis externa 12/10/2013   Seasonal allergies    some asthma   Stroke ([place])    Unspecified asthma(493.90) 12/29/2013    Objective: BP 140/80   Pulse 79   Temp 98.5 F (36.9 C) (Oral)   Ht '5\' 8"'$  (1.727 m)   Wt 176 lb 6 oz (80 kg)   SpO2 97%   BMI 26.82 kg/m  General: Awake, appears stated age Skin: On the volar surface of his lateral wrist on the left, there is a raised and flesh-colored lesion that feels like it is filled with a gelatinous type substance.  There is moderate tenderness to palpation without excessive warmth, fluctuance, drainage, erythema, or induration. Lungs: No accessory muscle use Psych: Age appropriate judgment and insight, normal affect and mood  Assessment and Plan: Ganglion cyst of volar aspect of left wrist - Plan: Ambulatory referral to Hand Surgery, traMADol (ULTRAM) 50 MG tablet  Chronic, uncontrolled.  Refer to the hand team, trial  tramadol for pain.  Twice daily dosing as he is on Effexor.  NSAIDs probably not the best idea as he is on Plavix.  Tylenol and ice should be helpful. Follow-up as originally scheduled with his normal PCP. The patient voiced understanding and agreement to the plan.  [place], DO 05/08/21  3:36 PM

## 2021-05-23 ENCOUNTER — Other Ambulatory Visit (HOSPITAL_BASED_OUTPATIENT_CLINIC_OR_DEPARTMENT_OTHER): Payer: Self-pay

## 2021-05-23 ENCOUNTER — Ambulatory Visit (INDEPENDENT_AMBULATORY_CARE_PROVIDER_SITE_OTHER): Payer: Medicare Other | Admitting: Orthopedic Surgery

## 2021-05-23 ENCOUNTER — Ambulatory Visit (INDEPENDENT_AMBULATORY_CARE_PROVIDER_SITE_OTHER): Payer: Medicare Other

## 2021-05-23 ENCOUNTER — Encounter: Payer: Self-pay | Admitting: Orthopedic Surgery

## 2021-05-23 ENCOUNTER — Encounter: Payer: Self-pay | Admitting: Gastroenterology

## 2021-05-23 ENCOUNTER — Other Ambulatory Visit: Payer: Self-pay

## 2021-05-23 VITALS — BP 139/89 | HR 70 | Ht 68.0 in | Wt 176.4 lb

## 2021-05-23 DIAGNOSIS — M67432 Ganglion, left wrist: Secondary | ICD-10-CM

## 2021-05-23 DIAGNOSIS — M25532 Pain in left wrist: Secondary | ICD-10-CM

## 2021-05-23 NOTE — H&P (View-Only) (Signed)
Office Visit Note   Patient: Paul Drake           Date of Birth: May 20, 1946           MRN: [id number] Visit Date: 05/23/2021              Requested by: Shelda Pal, [address],  [place] [address] PCP: Mosie Lukes, MD   Assessment & Plan: Visit Diagnoses:  1. Pain in left wrist   2. Ganglion cyst of volar aspect of left wrist     Plan: We discussed the diagnosis, prognosis, and both conservative and operative treatment options for volar ganglion cysts.  After our discussion, the patient has elected to proceed with surgical excision.  We reviewed the benefits of surgery and the potential risks including, but not limited to, persistent symptoms/recurrence, infection, damage to nearby nerves and blood vessels, delayed wound healing.    All patient concerns and questions were addressed.  A surgical date will be confirmed with the patient.    Follow-Up Instructions: No follow-ups on file.   Orders:  Orders Placed This Encounter  Procedures   XR Wrist Complete Left   No orders of the defined types were placed in this encounter.     Procedures: No procedures performed   Clinical Data: No additional findings.   Subjective: Chief Complaint  Patient presents with   Left Wrist - New Patient (Initial Visit)    This is a 75 year old left-hand-dominant male who presents with a volar ganglion cyst on the left wrist.  The cyst has been present for about 3 years.  The cyst comes and goes.  It has been drained multiple times.  The drainage has been clear goopy material consistent with a ganglion cyst.  He has pain that radiates up and down his forearm that is worse with activities around the house and playing golf.  He denies any numbness or paresthesias in his fingers.  He denies any lumps or bumps anywhere else.  To having this thing drained multiple times, he is interested in pursuing surgical management.   Review of Systems   Constitutional: Negative.   HENT: Negative.    Respiratory: Negative.    Cardiovascular: Negative.   Skin: Negative.   Neurological: Negative.   Hematological:  Bruises/bleeds easily.    Objective: Vital Signs: BP 139/89 (BP Location: Right Arm, Patient Position: Sitting, Cuff Size: Normal)   Pulse 70   Ht '5\' 8"'$  (1.727 m)   Wt 176 lb 6.4 oz (80 kg)   SpO2 98%   BMI 26.82 kg/m   Physical Exam Constitutional:      Appearance: Normal appearance.  Cardiovascular:     Rate and Rhythm: Normal rate.     Pulses: Normal pulses.  Pulmonary:     Effort: Pulmonary effort is normal.  Neurological:     Mental Status: He is alert.    Left Hand Exam   Tenderness  Left hand tenderness location: Tenderness to palpation at the volar radial aspect of the left wrist.   Range of Motion  The patient has normal left wrist ROM.  Other  Erythema: absent Scars: absent Pulse: present  Comments:  There is an approximately 2 x 2 centimeter mass at the volar radial aspect of the left wrist.  The mass is firm, well-circumscribed, and mobile.  It does not appear to be adherent to the deeper structures.     Specialty Comments:  No specialty comments available.  Imaging: No results found.   PMFS History: Patient Active Problem List   Diagnosis Date Noted   Ganglion cyst of volar aspect of left wrist 05/23/2021   Sinusitis 02/02/2021   Urinary frequency 06/27/2020   Finger pain, left 04/15/2020   Tinnitus of both ears 02/08/2020   Headache 02/08/2020   Parkinson's disease ([place]) 11/29/2019   Ganglion cyst of dorsum of left wrist 05/19/2018   Elevated testosterone level in male 05/19/2018   REM behavioral disorder 01/27/2018   Asthma 10/30/2017   Vertebrobasilar artery syndrome 07/29/2017   Left hand pain 07/21/2017   Chronic deep vein thrombosis (DVT) of popliteal vein of right lower extremity ([place]) 01/17/2017   TIA (transient ischemic attack) 09/26/2016   HLD (hyperlipidemia)  02/14/2016   Cerebrovascular accident (CVA) due to embolism of right middle cerebral artery ([place]) 08/15/2015   Medicare annual wellness visit, subsequent 06/26/2015   Eustachian tube dysfunction 03/11/2015   Hyperglycemia 01/06/2015   Allergic rhinitis 12/15/2014   Otitis media 12/15/2014   Vertigo 12/15/2014   PFO (patent foramen ovale) 11/24/2014   Essential hypertension 11/24/2014   DVT, lower extremity ([place]) 10/04/2014   Stroke ([place]) 09/20/2014   Barrett's esophagus 08/16/2014   Lumbago 08/16/2014   IBS (irritable bowel syndrome) 08/16/2014   Abdominal pain 07/06/2014   Allergic state 12/29/2013   Unspecified asthma(493.90) 12/29/2013   GERD (gastroesophageal reflux disease)    Need for prophylactic vaccination with combined diphtheria-tetanus-pertussis (DTP) vaccine 11/15/2013   BPH (benign prostatic hyperplasia) 11/15/2013   Renal lithiasis 11/15/2013   Depression with anxiety 11/15/2013   Anosmia 11/15/2013   History of CVA (cerebrovascular accident) 11/15/2013   CMC arthritis, thumb, degenerative 11/04/2013   Past Medical History:  Diagnosis Date   Allergic state 12/29/2013   Arthritis    Barrett's esophagus    BPH (benign prostatic hyperplasia)    Chicken pox    CVA (cerebral infarction)    Depression    GERD (gastroesophageal reflux disease)    History of kidney stones    HTN (hypertension), benign 04/01/2015   Hyperglycemia 01/06/2015   Hyperlipemia    Hypertension    IBS (irritable bowel syndrome)    Left hand pain 07/21/2017   Measles as a child   Medicare annual wellness visit, subsequent 06/26/2015   Mumps as a child   Otitis externa 12/10/2013   Seasonal allergies    some asthma   Stroke ([place])    Unspecified asthma(493.90) 12/29/2013    Family History  Problem Relation Age of Onset   Hypertension Mother    Hyperlipidemia Mother    Fibromyalgia Mother    Arthritis Mother        rheumatoid   Diabetes Sister        type 2   Hyperlipidemia Brother     Hypertension Brother    Prostate cancer Brother    Ulcers Father 70       Bleeding Ulcers   Kidney Stones Daughter    Asthma Daughter    Healthy Son    Cancer Maternal Grandfather        skin ?   Stroke Maternal Aunt    Cancer Maternal Uncle        prostate    Past Surgical History:  Procedure Laterality Date   COLONOSCOPY     EP IMPLANTABLE DEVICE N/A 10/16/2016   Procedure: Loop Recorder Insertion;  Surgeon: Thompson Grayer, MD;  Location: [facility];  Service: Cardiovascular;  Laterality: N/A;   implantable loop recorder  removal  09/21/2020   Reveal LINQ removed in office   LITHOTRIPSY     multiple times   PROSTATE SURGERY  08/2017   TEE WITHOUT CARDIOVERSION N/A 09/22/2014   Procedure: TRANSESOPHAGEAL ECHOCARDIOGRAM (TEE);  Surgeon: Josue Hector, MD;  Location: [facility] ENDOS[facility];  Service: Cardiovascular;  Laterality: N/A;   WISDOM TOOTH EXTRACTION     Social History   Occupational History   Occupation: retired    Comment: retired  Tobacco Use   Smoking status: Never   Smokeless tobacco: Never  Vaping Use   Vaping Use: Never used  Substance and Sexual Activity   Alcohol use: Yes    Alcohol/week: 1.0 standard drink    Types: 1 Shots of liquor per week    Comment: daily   Drug use: No   Sexual activity: Yes    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

## 2021-05-23 NOTE — Progress Notes (Signed)
Office Visit Note   Patient: Paul Drake           Date of Birth: [birth year]           MRN: [id number] Visit Date: 05/23/2021              Requested by: Shelda Pal, [address] [place],  [place] [address] PCP: Mosie Lukes, MD   Assessment & Plan: Visit Diagnoses:  1. Pain in left wrist   2. Ganglion cyst of volar aspect of left wrist     Plan: We discussed the diagnosis, prognosis, and both conservative and operative treatment options for volar ganglion cysts.  After our discussion, the patient has elected to proceed with surgical excision.  We reviewed the benefits of surgery and the potential risks including, but not limited to, persistent symptoms/recurrence, infection, damage to nearby nerves and blood vessels, delayed wound healing.    All patient concerns and questions were addressed.  A surgical date will be confirmed with the patient.    Follow-Up Instructions: No follow-ups on file.   Orders:  Orders Placed This Encounter  Procedures   XR Wrist Complete Left   No orders of the defined types were placed in this encounter.     Procedures: No procedures performed   Clinical Data: No additional findings.   Subjective: Chief Complaint  Patient presents with   Left Wrist - New Patient (Initial Visit)    This is a 75 year old left-hand-dominant male who presents with a volar ganglion cyst on the left wrist.  The cyst has been present for about 3 years.  The cyst comes and goes.  It has been drained multiple times.  The drainage has been clear goopy material consistent with a ganglion cyst.  He has pain that radiates up and down his forearm that is worse with activities around the house and playing golf.  He denies any numbness or paresthesias in his fingers.  He denies any lumps or bumps anywhere else.  To having this thing drained multiple times, he is interested in pursuing surgical management.   Review of Systems   Constitutional: Negative.   HENT: Negative.    Respiratory: Negative.    Cardiovascular: Negative.   Skin: Negative.   Neurological: Negative.   Hematological:  Bruises/bleeds easily.    Objective: Vital Signs: BP 139/89 (BP Location: Right Arm, Patient Position: Sitting, Cuff Size: Normal)   Pulse 70   Ht '5\' 8"'$  (1.727 m)   Wt 176 lb 6.4 oz (80 kg)   SpO2 98%   BMI 26.82 kg/m   Physical Exam Constitutional:      Appearance: Normal appearance.  Cardiovascular:     Rate and Rhythm: Normal rate.     Pulses: Normal pulses.  Pulmonary:     Effort: Pulmonary effort is normal.  Neurological:     Mental Status: He is alert.    Left Hand Exam   Tenderness  Left hand tenderness location: Tenderness to palpation at the volar radial aspect of the left wrist.   Range of Motion  The patient has normal left wrist ROM.  Other  Erythema: absent Scars: absent Pulse: present  Comments:  There is an approximately 2 x 2 centimeter mass at the volar radial aspect of the left wrist.  The mass is firm, well-circumscribed, and mobile.  It does not appear to be adherent to the deeper structures.     Specialty Comments:  No specialty comments available.  Imaging: No results found.   PMFS History: Patient Active Problem List   Diagnosis Date Noted   Ganglion cyst of volar aspect of left wrist 05/23/2021   Sinusitis 02/02/2021   Urinary frequency 06/27/2020   Finger pain, left 04/15/2020   Tinnitus of both ears 02/08/2020   Headache 02/08/2020   Parkinson's disease ([place]) 11/29/2019   Ganglion cyst of dorsum of left wrist 05/19/2018   Elevated testosterone level in male 05/19/2018   REM behavioral disorder 01/27/2018   Asthma 10/30/2017   Vertebrobasilar artery syndrome 07/29/2017   Left hand pain 07/21/2017   Chronic deep vein thrombosis (DVT) of popliteal vein of right lower extremity ([place]) 01/17/2017   TIA (transient ischemic attack) 09/26/2016   HLD (hyperlipidemia)  02/14/2016   Cerebrovascular accident (CVA) due to embolism of right middle cerebral artery ([place]) 08/15/2015   Medicare annual wellness visit, subsequent 06/26/2015   Eustachian tube dysfunction 03/11/2015   Hyperglycemia 01/06/2015   Allergic rhinitis 12/15/2014   Otitis media 12/15/2014   Vertigo 12/15/2014   PFO (patent foramen ovale) 11/24/2014   Essential hypertension 11/24/2014   DVT, lower extremity ([place]) 10/04/2014   Stroke ([place]) 09/20/2014   Barrett's esophagus 08/16/2014   Lumbago 08/16/2014   IBS (irritable bowel syndrome) 08/16/2014   Abdominal pain 07/06/2014   Allergic state 12/29/2013   Unspecified asthma(493.90) 12/29/2013   GERD (gastroesophageal reflux disease)    Need for prophylactic vaccination with combined diphtheria-tetanus-pertussis (DTP) vaccine 11/15/2013   BPH (benign prostatic hyperplasia) 11/15/2013   Renal lithiasis 11/15/2013   Depression with anxiety 11/15/2013   Anosmia 11/15/2013   History of CVA (cerebrovascular accident) 11/15/2013   CMC arthritis, thumb, degenerative 11/04/2013   Past Medical History:  Diagnosis Date   Allergic state 12/29/2013   Arthritis    Barrett's esophagus    BPH (benign prostatic hyperplasia)    Chicken pox    CVA (cerebral infarction)    Depression    GERD (gastroesophageal reflux disease)    History of kidney stones    HTN (hypertension), benign 04/01/2015   Hyperglycemia 01/06/2015   Hyperlipemia    Hypertension    IBS (irritable bowel syndrome)    Left hand pain 07/21/2017   Measles as a child   Medicare annual wellness visit, subsequent 06/26/2015   Mumps as a child   Otitis externa 12/10/2013   Seasonal allergies    some asthma   Stroke ([place])    Unspecified asthma(493.90) 12/29/2013    Family History  Problem Relation Age of Onset   Hypertension Mother    Hyperlipidemia Mother    Fibromyalgia Mother    Arthritis Mother        rheumatoid   Diabetes Sister        type 2   Hyperlipidemia Brother     Hypertension Brother    Prostate cancer Brother    Ulcers Father 77       Bleeding Ulcers   Kidney Stones Daughter    Asthma Daughter    Healthy Son    Cancer Maternal Grandfather        skin ?   Stroke Maternal Aunt    Cancer Maternal Uncle        prostate    Past Surgical History:  Procedure Laterality Date   COLONOSCOPY     EP IMPLANTABLE DEVICE N/A 10/16/2016   Procedure: Loop Recorder Insertion;  Surgeon: Thompson Grayer, MD;  Location: [facility];  Service: Cardiovascular;  Laterality: N/A;   implantable loop recorder  removal  09/21/2020   Reveal LINQ removed in office   LITHOTRIPSY     multiple times   PROSTATE SURGERY  08/2017   TEE WITHOUT CARDIOVERSION N/A 09/22/2014   Procedure: TRANSESOPHAGEAL ECHOCARDIOGRAM (TEE);  Surgeon: Josue Hector, MD;  Location: [facility] ENDOSCOPY;  Service: Cardiovascular;  Laterality: N/A;   WISDOM TOOTH EXTRACTION     Social History   Occupational History   Occupation: retired    Comment: retired  Tobacco Use   Smoking status: Never   Smokeless tobacco: Never  Vaping Use   Vaping Use: Never used  Substance and Sexual Activity   Alcohol use: Yes    Alcohol/week: 1.0 standard drink    Types: 1 Shots of liquor per week    Comment: daily   Drug use: No   Sexual activity: Yes    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

## 2021-05-24 ENCOUNTER — Telehealth: Payer: Self-pay

## 2021-05-24 DIAGNOSIS — H43393 Other vitreous opacities, bilateral: Secondary | ICD-10-CM | POA: Diagnosis not present

## 2021-05-24 DIAGNOSIS — H52203 Unspecified astigmatism, bilateral: Secondary | ICD-10-CM | POA: Diagnosis not present

## 2021-05-24 DIAGNOSIS — H02886 Meibomian gland dysfunction of left eye, unspecified eyelid: Secondary | ICD-10-CM | POA: Diagnosis not present

## 2021-05-24 DIAGNOSIS — H524 Presbyopia: Secondary | ICD-10-CM | POA: Diagnosis not present

## 2021-05-24 DIAGNOSIS — H25813 Combined forms of age-related cataract, bilateral: Secondary | ICD-10-CM | POA: Diagnosis not present

## 2021-05-24 DIAGNOSIS — H5203 Hypermetropia, bilateral: Secondary | ICD-10-CM | POA: Diagnosis not present

## 2021-05-24 DIAGNOSIS — H02883 Meibomian gland dysfunction of right eye, unspecified eyelid: Secondary | ICD-10-CM | POA: Diagnosis not present

## 2021-05-24 NOTE — Telephone Encounter (Signed)
Pt called and stated that he would like to get the cyst taken care of as soon as possible

## 2021-05-25 ENCOUNTER — Ambulatory Visit: Payer: Self-pay | Admitting: Orthopedic Surgery

## 2021-05-30 ENCOUNTER — Other Ambulatory Visit: Payer: Self-pay

## 2021-05-30 ENCOUNTER — Encounter (HOSPITAL_BASED_OUTPATIENT_CLINIC_OR_DEPARTMENT_OTHER): Payer: Self-pay | Admitting: Orthopedic Surgery

## 2021-05-30 NOTE — Progress Notes (Signed)
Cheryl at Dr Madelynn Done office notified of need for Plavix orders for this patient for surgery.

## 2021-05-31 ENCOUNTER — Other Ambulatory Visit: Payer: Self-pay

## 2021-05-31 DIAGNOSIS — G20 Parkinson's disease: Secondary | ICD-10-CM

## 2021-05-31 MED ORDER — CARBIDOPA-LEVODOPA 25-100 MG PO TABS: 1.0000 | ORAL_TABLET | Freq: Three times a day (TID) | ORAL | 0 refills | Status: DC

## 2021-06-01 ENCOUNTER — Telehealth: Payer: Self-pay | Admitting: Neurology

## 2021-06-01 ENCOUNTER — Other Ambulatory Visit: Payer: Self-pay | Admitting: Family Medicine

## 2021-06-01 NOTE — Telephone Encounter (Signed)
Cheryl @ [facility] is calling re: orders for pt's Plavix due to pt having a cyst removed (day surgery) on 09-22, please call [phone]

## 2021-06-05 ENCOUNTER — Other Ambulatory Visit (HOSPITAL_BASED_OUTPATIENT_CLINIC_OR_DEPARTMENT_OTHER): Payer: Self-pay

## 2021-06-05 ENCOUNTER — Other Ambulatory Visit: Payer: Self-pay

## 2021-06-05 ENCOUNTER — Other Ambulatory Visit (INDEPENDENT_AMBULATORY_CARE_PROVIDER_SITE_OTHER): Payer: Medicare Other

## 2021-06-05 ENCOUNTER — Ambulatory Visit: Payer: Medicare Other | Attending: Internal Medicine

## 2021-06-05 DIAGNOSIS — E785 Hyperlipidemia, unspecified: Secondary | ICD-10-CM | POA: Diagnosis not present

## 2021-06-05 DIAGNOSIS — I10 Essential (primary) hypertension: Secondary | ICD-10-CM | POA: Diagnosis not present

## 2021-06-05 DIAGNOSIS — Z23 Encounter for immunization: Secondary | ICD-10-CM | POA: Diagnosis not present

## 2021-06-05 DIAGNOSIS — R739 Hyperglycemia, unspecified: Secondary | ICD-10-CM | POA: Diagnosis not present

## 2021-06-05 LAB — COMPREHENSIVE METABOLIC PANEL
ALT: 4 U/L (ref 0–53)
AST: 15 U/L (ref 0–37)
Albumin: 4.1 g/dL (ref 3.5–5.2)
Alkaline Phosphatase: 68 U/L (ref 39–117)
BUN: 18 mg/dL (ref 6–23)
CO2: 31 mEq/L (ref 19–32)
Calcium: 9.4 mg/dL (ref 8.4–10.5)
Chloride: 105 mEq/L (ref 96–112)
Creatinine, Ser: 1.22 mg/dL (ref 0.40–1.50)
GFR: 57.39 mL/min — ABNORMAL LOW (ref >60.00)
Glucose, Bld: 105 mg/dL — ABNORMAL HIGH (ref 70–99)
Potassium: 4.3 mEq/L (ref 3.5–5.1)
Sodium: 142 mEq/L (ref 135–145)
Total Bilirubin: 0.4 mg/dL (ref 0.2–1.2)
Total Protein: 6.6 g/dL (ref 6.0–8.3)

## 2021-06-05 LAB — CBC
HCT: 38.7 % — ABNORMAL LOW (ref 39.0–52.0)
Hemoglobin: 12.8 g/dL — ABNORMAL LOW (ref 13.0–17.0)
MCHC: 32.9 g/dL (ref 30.0–36.0)
MCV: 98.2 fl (ref 78.0–100.0)
Platelets: 169 10*3/uL (ref 150.0–400.0)
RBC: 3.94 Mil/uL — ABNORMAL LOW (ref 4.22–5.81)
RDW: 12.7 % (ref 11.5–15.5)
WBC: 5.2 10*3/uL (ref 4.0–10.5)

## 2021-06-05 LAB — LIPID PANEL
Cholesterol: 145 mg/dL (ref 0–200)
HDL: 61.7 mg/dL (ref >39.00)
LDL Cholesterol: 67 mg/dL (ref 0–99)
NonHDL: 83.49
Total CHOL/HDL Ratio: 2
Triglycerides: 83 mg/dL (ref 0.0–149.0)
VLDL: 16.6 mg/dL (ref 0.0–40.0)

## 2021-06-05 LAB — HEMOGLOBIN A1C: Hgb A1c MFr Bld: 5.7 % (ref 4.6–6.5)

## 2021-06-05 LAB — TSH: TSH: 1.54 u[IU]/mL (ref 0.35–5.50)

## 2021-06-05 MED ORDER — INFLUENZA VAC A&B SA ADJ QUAD 0.5 ML IM PRSY
PREFILLED_SYRINGE | INTRAMUSCULAR | 0 refills | Status: DC
  Filled 2021-06-05: qty 0.5, 1d supply, fill #0

## 2021-06-05 NOTE — Progress Notes (Signed)
   Covid-19 Vaccination Clinic  Name:  Lucio Litsey    MRN: [id number] DOB: 12/09/1945  06/05/2021  Mr. Eichinger was observed post Covid-19 immunization for 15 minutes without incident. He was provided with Vaccine Information Sheet and instruction to access the V-Safe system.   Mr. Deller was instructed to call 911 with any severe reactions post vaccine: Difficulty breathing  Swelling of face and throat  A fast heartbeat  A bad rash all over body  Dizziness and weakness

## 2021-06-05 NOTE — Telephone Encounter (Signed)
I returned the call to [place] at the number below. Her confidential voicemail picked up. I read the message from Dr. Leonie Man on the recording. Provided our number to call back with any questions. We are also willing to fax this in writing, if needed.

## 2021-06-06 ENCOUNTER — Telehealth: Payer: Self-pay | Admitting: Orthopedic Surgery

## 2021-06-06 NOTE — Telephone Encounter (Signed)
error 

## 2021-06-08 ENCOUNTER — Ambulatory Visit (HOSPITAL_BASED_OUTPATIENT_CLINIC_OR_DEPARTMENT_OTHER): Payer: Medicare Other | Admitting: Certified Registered"

## 2021-06-08 ENCOUNTER — Ambulatory Visit (HOSPITAL_BASED_OUTPATIENT_CLINIC_OR_DEPARTMENT_OTHER)
Admission: RE | Admit: 2021-06-08 | Discharge: 2021-06-08 | Disposition: A | Discharge status: Home / Self Care | Payer: Medicare Other | Attending: Orthopedic Surgery | Admitting: Orthopedic Surgery

## 2021-06-08 ENCOUNTER — Encounter (HOSPITAL_BASED_OUTPATIENT_CLINIC_OR_DEPARTMENT_OTHER): Payer: Self-pay | Admitting: Orthopedic Surgery

## 2021-06-08 ENCOUNTER — Other Ambulatory Visit: Payer: Self-pay

## 2021-06-08 ENCOUNTER — Encounter (HOSPITAL_BASED_OUTPATIENT_CLINIC_OR_DEPARTMENT_OTHER)
Admission: RE | Disposition: A | Discharge status: Home / Self Care | Payer: Self-pay | Source: Home / Self Care | Attending: Orthopedic Surgery

## 2021-06-08 DIAGNOSIS — Z86718 Personal history of other venous thrombosis and embolism: Secondary | ICD-10-CM | POA: Insufficient documentation

## 2021-06-08 DIAGNOSIS — Z8673 Personal history of transient ischemic attack (TIA), and cerebral infarction without residual deficits: Secondary | ICD-10-CM | POA: Diagnosis not present

## 2021-06-08 DIAGNOSIS — M67432 Ganglion, left wrist: Secondary | ICD-10-CM

## 2021-06-08 DIAGNOSIS — G450 Vertebro-basilar artery syndrome: Secondary | ICD-10-CM | POA: Insufficient documentation

## 2021-06-08 DIAGNOSIS — E785 Hyperlipidemia, unspecified: Secondary | ICD-10-CM | POA: Insufficient documentation

## 2021-06-08 DIAGNOSIS — G20 Parkinson's disease: Secondary | ICD-10-CM | POA: Diagnosis not present

## 2021-06-08 DIAGNOSIS — Z8249 Family history of ischemic heart disease and other diseases of the circulatory system: Secondary | ICD-10-CM | POA: Insufficient documentation

## 2021-06-08 DIAGNOSIS — K219 Gastro-esophageal reflux disease without esophagitis: Secondary | ICD-10-CM | POA: Diagnosis not present

## 2021-06-08 DIAGNOSIS — Q211 Atrial septal defect: Secondary | ICD-10-CM | POA: Diagnosis not present

## 2021-06-08 DIAGNOSIS — I10 Essential (primary) hypertension: Secondary | ICD-10-CM | POA: Insufficient documentation

## 2021-06-08 HISTORY — PX: GANGLION CYST EXCISION: SHX1691

## 2021-06-08 SURGERY — EXCISION, GANGLION CYST, WRIST
Anesthesia: General | Site: Wrist | Laterality: Left

## 2021-06-08 MED ORDER — ONDANSETRON HCL 4 MG/2ML IJ SOLN
INTRAMUSCULAR | Status: AC
  Filled 2021-06-08: qty 2

## 2021-06-08 MED ORDER — ACETAMINOPHEN 500 MG PO TABS
ORAL_TABLET | ORAL | Status: AC
  Filled 2021-06-08: qty 2

## 2021-06-08 MED ORDER — FENTANYL CITRATE (PF) 100 MCG/2ML IJ SOLN
25.0000 ug | INTRAMUSCULAR | Status: DC | PRN
  Administered 2021-06-08 (×3): 50 ug via INTRAVENOUS

## 2021-06-08 MED ORDER — EPHEDRINE 5 MG/ML INJ
INTRAVENOUS | Status: AC
  Filled 2021-06-08: qty 5

## 2021-06-08 MED ORDER — FENTANYL CITRATE (PF) 100 MCG/2ML IJ SOLN
INTRAMUSCULAR | Status: AC
  Filled 2021-06-08: qty 2

## 2021-06-08 MED ORDER — LACTATED RINGERS IV SOLN: INTRAVENOUS | Status: DC

## 2021-06-08 MED ORDER — BUPIVACAINE HCL (PF) 0.25 % IJ SOLN
INTRAMUSCULAR | Status: DC | PRN
  Administered 2021-06-08: 5 mL

## 2021-06-08 MED ORDER — ACETAMINOPHEN 500 MG PO TABS
1000.0000 mg | ORAL_TABLET | Freq: Once | ORAL | Status: AC
  Administered 2021-06-08: 1000 mg via ORAL

## 2021-06-08 MED ORDER — PROPOFOL 500 MG/50ML IV EMUL
INTRAVENOUS | Status: DC | PRN
  Administered 2021-06-08: 100 ug/kg/min via INTRAVENOUS

## 2021-06-08 MED ORDER — CEFAZOLIN SODIUM-DEXTROSE 2-4 GM/100ML-% IV SOLN
INTRAVENOUS | Status: AC
  Filled 2021-06-08: qty 100

## 2021-06-08 MED ORDER — OXYCODONE HCL 5 MG PO TABS
ORAL_TABLET | ORAL | Status: AC
  Filled 2021-06-08: qty 1

## 2021-06-08 MED ORDER — OXYCODONE HCL 5 MG PO TABS
5.0000 mg | ORAL_TABLET | Freq: Once | ORAL | Status: AC
  Administered 2021-06-08: 5 mg via ORAL

## 2021-06-08 MED ORDER — ONDANSETRON HCL 4 MG/2ML IJ SOLN
INTRAMUSCULAR | Status: DC | PRN
  Administered 2021-06-08: 4 mg via INTRAVENOUS

## 2021-06-08 MED ORDER — PROPOFOL 500 MG/50ML IV EMUL
INTRAVENOUS | Status: AC
  Filled 2021-06-08: qty 50

## 2021-06-08 MED ORDER — OXYCODONE HCL 5 MG PO TABS: 5.0000 mg | ORAL_TABLET | Freq: Four times a day (QID) | ORAL | 0 refills | Status: DC | PRN

## 2021-06-08 MED ORDER — LIDOCAINE HCL (PF) 2 % IJ SOLN
INTRAMUSCULAR | Status: AC
  Filled 2021-06-08: qty 5

## 2021-06-08 MED ORDER — PROPOFOL 10 MG/ML IV BOLUS
INTRAVENOUS | Status: DC | PRN
  Administered 2021-06-08: 80 mg via INTRAVENOUS
  Administered 2021-06-08: 100 mg via INTRAVENOUS
  Administered 2021-06-08: 20 mg via INTRAVENOUS

## 2021-06-08 MED ORDER — CEFAZOLIN SODIUM-DEXTROSE 2-4 GM/100ML-% IV SOLN
2.0000 g | INTRAVENOUS | Status: AC
  Administered 2021-06-08: 2 g via INTRAVENOUS

## 2021-06-08 MED ORDER — PROPOFOL 10 MG/ML IV BOLUS
INTRAVENOUS | Status: AC
  Filled 2021-06-08: qty 20

## 2021-06-08 MED ORDER — FENTANYL CITRATE (PF) 100 MCG/2ML IJ SOLN
INTRAMUSCULAR | Status: DC | PRN
  Administered 2021-06-08 (×4): 25 ug via INTRAVENOUS

## 2021-06-08 MED ORDER — EPHEDRINE SULFATE-NACL 50-0.9 MG/10ML-% IV SOSY
PREFILLED_SYRINGE | INTRAVENOUS | Status: DC | PRN
  Administered 2021-06-08: 10 mg via INTRAVENOUS

## 2021-06-08 SURGICAL SUPPLY — 44 items
APL PRP STRL LF DISP 70% ISPRP (MISCELLANEOUS) ×1
BLADE SURG 15 STRL LF DISP TIS (BLADE) ×1 IMPLANT
BLADE SURG 15 STRL SS (BLADE) ×2
BNDG CMPR 9X4 STRL LF SNTH (GAUZE/BANDAGES/DRESSINGS) ×1
BNDG ELASTIC 3X5.8 VLCR STR LF (GAUZE/BANDAGES/DRESSINGS) ×2 IMPLANT
BNDG ESMARK 4X9 LF (GAUZE/BANDAGES/DRESSINGS) ×2 IMPLANT
BNDG GAUZE ELAST 4 BULKY (GAUZE/BANDAGES/DRESSINGS) ×2 IMPLANT
BNDG PLASTER X FAST 3X3 WHT LF (CAST SUPPLIES) ×20 IMPLANT
BNDG PLSTR 9X3 FST ST WHT (CAST SUPPLIES) ×10
CHLORAPREP W/TINT 26 (MISCELLANEOUS) ×2 IMPLANT
CORD BIPOLAR FORCEPS 12FT (ELECTRODE) ×2 IMPLANT
COVER BACK TABLE 60X90IN (DRAPES) ×2 IMPLANT
COVER MAYO STAND STRL (DRAPES) ×2 IMPLANT
CUFF TOURN SGL QUICK 18X4 (TOURNIQUET CUFF) ×2 IMPLANT
CUFF TOURN SGL QUICK 24 (TOURNIQUET CUFF)
CUFF TRNQT CYL 24X4X16.5-23 (TOURNIQUET CUFF) IMPLANT
DRAPE EXTREMITY T 121X128X90 (DISPOSABLE) ×2 IMPLANT
DRAPE SURG 17X23 STRL (DRAPES) ×2 IMPLANT
GAUZE 4X4 16PLY ~~LOC~~+RFID DBL (SPONGE) ×2 IMPLANT
GAUZE SPONGE 4X4 12PLY STRL (GAUZE/BANDAGES/DRESSINGS) ×2 IMPLANT
GAUZE XEROFORM 1X8 LF (GAUZE/BANDAGES/DRESSINGS) ×2 IMPLANT
GLOVE SURG ENC MOIS LTX SZ7 (GLOVE) ×2 IMPLANT
GLOVE SURG POLYISO LF SZ7 (GLOVE) ×2 IMPLANT
GLOVE SURG UNDER POLY LF SZ7 (GLOVE) ×4 IMPLANT
GOWN STRL REUS W/ TWL LRG LVL3 (GOWN DISPOSABLE) IMPLANT
GOWN STRL REUS W/TWL LRG LVL3 (GOWN DISPOSABLE)
GOWN STRL REUS W/TWL XL LVL3 (GOWN DISPOSABLE) ×4 IMPLANT
NEEDLE HYPO 25X1 1.5 SAFETY (NEEDLE) ×2 IMPLANT
NS IRRIG 1000ML POUR BTL (IV SOLUTION) ×2 IMPLANT
PACK BASIN DAY SURGERY FS (CUSTOM PROCEDURE TRAY) ×2 IMPLANT
PAD CAST 3X4 CTTN HI CHSV (CAST SUPPLIES) ×1 IMPLANT
PADDING CAST COTTON 3X4 STRL (CAST SUPPLIES) ×2
SLEEVE SCD COMPRESS KNEE MED (STOCKING) IMPLANT
STOCKINETTE 4X48 STRL (DRAPES) ×2 IMPLANT
SUCTION FRAZIER HANDLE 12FR (TUBING) ×1
SUCTION TUBE FRAZIER 12FR DISP (TUBING) ×1 IMPLANT
SUT ETHILON 4 0 PS 2 18 (SUTURE) ×2 IMPLANT
SUT MNCRL AB 3-0 PS2 18 (SUTURE) ×2 IMPLANT
SUT VICRYL 4-0 PS2 18IN ABS (SUTURE) IMPLANT
SYR BULB EAR ULCER 3OZ GRN STR (SYRINGE) ×2 IMPLANT
SYR CONTROL 10ML LL (SYRINGE) ×2 IMPLANT
TOWEL GREEN STERILE FF (TOWEL DISPOSABLE) ×2 IMPLANT
TUBE CONNECTING 20X1/4 (TUBING) ×2 IMPLANT
UNDERPAD 30X36 HEAVY ABSORB (UNDERPADS AND DIAPERS) ×2 IMPLANT

## 2021-06-08 NOTE — Op Note (Signed)
Date of Surgery: 06/08/2021   INDICATIONS: [name] is a 75 y.o.-year-old male with a left volar ganglion cyst.  Risks, benefits, and alternatives to surgery were discussed.  The Patient did consent to the procedure after extensive discussion.    PREOPERATIVE DIAGNOSIS: Left volar ganglion cyst   POSTOPERATIVE DIAGNOSIS: Same.   PROCEDURE: Left volar ganglion cyst excision   SURGEON: Audria Nine, M.D.   ASSIST:    ANESTHESIA:  general   IV FLUIDS AND URINE: See anesthesia.   ESTIMATED BLOOD LOSS: 10 mL.   IMPLANTS: None   DRAINS: None   COMPLICATIONS: None   DESCRIPTION OF PROCEDURE: The patient was met in the preoperative holding area where the surgical site was marked and the consent form was verified.  The patient was then taken to the operating room and transferred to the operating table.  All bony prominences were well padded.  A tourniquet was applied to the left upper arm.  The operative extremity was prepped and draped in the usual and sterile fashion.  A formal time-out was performed to confirm that this was the correct patient, surgery, side, and site.   Following formal timeout, the limb was exsanguinated and the tourniquet inflated to 250 mmHg.  A longitudinal incision was made overlying the mass.  The skin and subcutaneous tissue were sharply divided.  A superficial vein was running directly over the mass.  This was coagulated and divided.  Blunt dissection was used to circumferentially expose the cyst.  Careful dissection was used to identify branches of the radial artery as they entered the mass.  These were coagulated and divided.  The radial artery was identified.  The stalk of the cyst was traced deep as it entered the radiocarpal joint capsule.  The stalk was divided.  The radial artery was protected during the dissection.  The defect in the radiocarpal joint capsule was coagulated with the bipolar.  The tourniquet was then deflated.  The radial artery remained  pulsatile without bleeding.  Superficial hemostasis was achieved with direct pressure and bipolar electrocautery.  The wound was thoroughly irrigated and closed in a layered fashion with 3-0 monocryl and 4-0 nylon sutures.  Approx 8cc of 0.25% marcaine was infiltrated into the subcutaneous tissue as a postop field block.   The wound was then dressed with xeroform, folded kerlix, cast padding, and an ace wrap.  The patient was then reversed from sedation and transferred to the postoperative bed.  All counts were correct x 2.  He was taken to PACU in stable condition.       POSTOPERATIVE PLAN: He will be discharged to home from PACU.  He will be prescribed appropriate pain medication.  He was given written instructions regarding wound care and follow up.   Audria Nine, MD 1:06 PM

## 2021-06-08 NOTE — Discharge Instructions (Addendum)
Paul Drake, M.D. Hand Surgery  POST-OPERATIVE DISCHARGE INSTRUCTIONS   PRESCRIPTIONS: You have been given a prescription to be taken as directed for post-operative pain control.  You may also take over the counter ibuprofen/aleve and tylenol for pain. Take this as directed on the packaging. Do not exceed 3000 mg tylenol/acetaminophen in 24 hours.  Ibuprofen 600-800 mg (3-4) tablets by mouth every 6 hours as needed for pain.  OR Aleve 2 tablets by mouth every 12 hours (twice daily) as needed for pain.  AND/OR Tylenol 1000 mg (2 tablets) every 8 hours as needed for pain.  Please use your pain medication carefully, as refills are limited and you may not be provided with one.  As stated above, please use over the counter pain medicine - it will also be helpful with decreasing your swelling.    ANESTHESIA: After your surgery, post-surgical discomfort or pain is likely. This discomfort can last several days to a few weeks. At certain times of the day your discomfort may be more intense.   Did you receive a nerve block?  A nerve block can provide pain relief for one hour to two days after your surgery. As long as the nerve block is working, you will experience little or no sensation in the area the surgeon operated on.  As the nerve block wears off, you will begin to experience pain or discomfort. It is very important that you begin taking your prescribed pain medication before the nerve block fully wears off. Treating your pain at the first sign of the block wearing off will ensure your pain is better controlled and more tolerable when full-sensation returns. Do not wait until the pain is intolerable, as the medicine will be less effective. It is better to treat pain in advance than to try and catch up.   General Anesthesia:  If you did not receive a nerve block during your surgery, you will need to start taking your pain medication shortly after your surgery and should continue to  do so as prescribed by your surgeon.     ICE AND ELEVATION: You may use ice for the first 48-72 hours, but it is not critical.   Motion of your fingers is very important s to decrease the swelling. Please follow the finger range of motion exercises below to assist you in regaining your motion. This should be done at least 10 repetitions 3-4 times a day.  Elevation, as much as possible for the next 48 hours, is critical for decreasing swelling as well as for pain relief. Elevation means when you are seated or lying down, you hand should be at or above your heart. When walking, the hand needs to be at or above the level of your elbow.  If the bandage gets too tight, it may need to be loosened. Please contact our office and we will instruct you in how to do this.    SURGICAL BANDAGES:  Keep your dressing and/or splint clean and dry at all times.  You can remove your dressing 4 days from now and change with a dry dressing or Band-Aids as needed thereafter. You may place a plastic bag over your bandage to shower, but be careful, do not get your bandages wet.  After the bandages have been removed, it is OK to get the stitches wet in a shower or with hand washing. Do Not soak or submerge the wound yet. Please do not use lotions or creams on the stitches.  HAND THERAPY:  You may not need any. If you do, we will begin this at your follow up visit in the clinic.    ACTIVITY AND WORK: You are encouraged to move any fingers which are not in the bandage. Attached is an instruction sheet on finger motion. Do this as much as you need to make your fingers move fully and keep the swelling down.  Light use of the fingers is allowed to assist the other hand with daily hygiene and eating, but strong gripping or lifting is often uncomfortable and should be avoided.  You might miss a variable period of time from work and hopefully this issue has been discussed prior to surgery. You may not do any heavy work  with your affected hand for about 2 weeks.    [facility] [address] [place],  [place]  [id number] [phone]    Post Anesthesia Home Care Instructions  Activity: Get plenty of rest for the remainder of the day. A responsible individual must stay with you for 24 hours following the procedure.  For the next 24 hours, DO NOT: -Drive a car -Paediatric nurse -Drink alcoholic beverages -Take any medication unless instructed by your physician -Make any legal decisions or sign important papers.  Meals: Start with liquid foods such as gelatin or soup. Progress to regular foods as tolerated. Avoid greasy, spicy, heavy foods. If nausea and/or vomiting occur, drink only clear liquids until the nausea and/or vomiting subsides. Call your physician if vomiting continues.  Special Instructions/Symptoms: Your throat may feel dry or sore from the anesthesia or the breathing tube placed in your throat during surgery. If this causes discomfort, gargle with warm salt water. The discomfort should disappear within 24 hours.  If you had a scopolamine patch placed behind your ear for the management of post- operative nausea and/or vomiting:  1. The medication in the patch is effective for 72 hours, after which it should be removed.  Wrap patch in a tissue and discard in the trash. Wash hands thoroughly with soap and water. 2. You may remove the patch earlier than 72 hours if you experience unpleasant side effects which may include dry mouth, dizziness or visual disturbances. 3. Avoid touching the patch. Wash your hands with soap and water after contact with the patch.     Next dose of tylenol after 5pm today.

## 2021-06-08 NOTE — Brief Op Note (Signed)
06/08/2021  12:48 PM  PATIENT:  Paul Drake  75 y.o. male  PRE-OPERATIVE DIAGNOSIS:  Left Wrist Volar Ganglion  POST-OPERATIVE DIAGNOSIS:  Left Wrist Volar Ganglion  PROCEDURE:  Procedure(s): LEFT VOLAR GANGLION EXCISION (Left)  SURGEON:  Surgeon(s) and Role:    * Sherilyn Cooter, MD - Primary  PHYSICIAN ASSISTANT:   ASSISTANTS: none   ANESTHESIA:   general  EBL:  10cc   BLOOD ADMINISTERED:none  DRAINS: none   LOCAL MEDICATIONS USED:  MARCAINE     SPECIMEN:  Source of Specimen:  Left volar wrist  DISPOSITION OF SPECIMEN:  PATHOLOGY  COUNTS:  YES  TOURNIQUET:   Total Tourniquet Time Documented: Upper Arm (Left) - 19 minutes Total: Upper Arm (Left) - 19 minutes   DICTATION: .Viviann Spare Dictation  PLAN OF CARE: Discharge to home after PACU  PATIENT DISPOSITION:  PACU - hemodynamically stable.   Delay start of Pharmacological VTE agent (>24hrs) due to surgical blood loss or risk of bleeding: not applicable

## 2021-06-08 NOTE — Op Note (Signed)
   Date of Surgery: 06/08/2021  INDICATIONS: Paul Drake is a 74 y.o.-year-old male with a left volar ganglion cyst.  Risks, benefits, and alternatives to surgery were discussed.  The Patient did consent to the procedure after extensive discussion.   PREOPERATIVE DIAGNOSIS: Left volar ganglion cyst  POSTOPERATIVE DIAGNOSIS: Same.  PROCEDURE: Left volar ganglion cyst excision  SURGEON: Audria Nine, M.D.  ASSIST:   ANESTHESIA:  general  IV FLUIDS AND URINE: See anesthesia.  ESTIMATED BLOOD LOSS: 10 mL.  IMPLANTS: None  DRAINS: None  COMPLICATIONS: None  DESCRIPTION OF PROCEDURE: The patient was met in the preoperative holding area where the surgical site was marked and the consent form was verified.  The patient was then taken to the operating room and transferred to the operating table.  All bony prominences were well padded.  A tourniquet was applied to the left upper arm.  The operative extremity was prepped and draped in the usual and sterile fashion.  A formal time-out was performed to confirm that this was the correct patient, surgery, side, and site.  Following formal timeout, the limb was exsanguinated and the tourniquet inflated to 250 mmHg.  A longitudinal incision was made overlying the mass.  The skin and subcutaneous tissue were sharply divided.  A superficial vein was running directly over the mass.  This was coagulated and divided.  Blunt dissection was used to circumferentially expose the cyst.  Careful dissection was used to identify branches of the radial artery as they entered the mass.  These were coagulated and divided.  The radial artery was identified.  The stalk of the cyst was traced deep as it entered the radiocarpal joint capsule.  The stalk was divided.  The radial artery was protected during the dissection.  The defect in the radiocarpal joint capsule was coagulated with the bipolar.  The tourniquet was then deflated.  The radial artery remained pulsatile  without bleeding.  Superficial hemostasis was achieved with direct pressure and bipolar electrocautery.  The wound was thoroughly irrigated and closed in a layered fashion with 3-0 monocryl and 4-0 nylon sutures.  Approx 8cc of 0.25% marcaine was infiltrated into the subcutaneous tissue as a postop field block.   The wound was then dressed with xeroform, folded kerlix, cast padding, and an ace wrap.  The patient was then reversed from sedation and transferred to the postoperative bed.  All counts were correct x 2.  He was taken to PACU in stable condition.     POSTOPERATIVE PLAN: He will be discharged to home from PACU.  He will be prescribed appropriate pain medication.  He was given written instructions regarding wound care and follow up.  Audria Nine, MD 1:06 PM

## 2021-06-08 NOTE — Anesthesia Postprocedure Evaluation (Signed)
Anesthesia Post Note  Patient: Paul Drake  Procedure(s) Performed: LEFT VOLAR GANGLION EXCISION (Left: Wrist)     Patient location during evaluation: PACU Anesthesia Type: General Level of consciousness: awake and alert Pain management: pain level controlled Vital Signs Assessment: post-procedure vital signs reviewed and stable Respiratory status: spontaneous breathing, nonlabored ventilation and respiratory function stable Cardiovascular status: blood pressure returned to baseline and stable Postop Assessment: no apparent nausea or vomiting Anesthetic complications: no   No notable events documented.  Last Vitals:  Vitals:   06/08/21 1330 06/08/21 1345  BP: 140/80 (!) 150/83  Pulse: 75 80  Resp: 16 16  Temp:  36.5 C  SpO2: 95% 98%    Last Pain:  Vitals:   06/08/21 1355  TempSrc:   PainSc: [place] Merelyn Klump

## 2021-06-08 NOTE — Anesthesia Preprocedure Evaluation (Addendum)
Anesthesia Evaluation  Patient identified by MRN, date of birth, ID band Patient awake    Reviewed: Allergy & Precautions, NPO status , Patient's Chart, lab work & pertinent test results  History of Anesthesia Complications Negative for: history of anesthetic complications  Airway Mallampati: II  TM Distance: >3 FB Neck ROM: Full    Dental  (+) Dental Advisory Given   Pulmonary asthma ,    breath sounds clear to auscultation       Cardiovascular hypertension, Pt. on medications (-) angina Rhythm:Regular Rate:Normal  Pt says he has PFO, yet ECHO as below  '16 ECHO: LV: Systolic function was normal. EF 55-60%.  - Aortic valve: mild regurgitation.  - Mitral valve: mild regurgitation.  - Left atrium: mildly dilated.  - Right atrium: No evidence of thrombus in the atrial cavity or appendage.  - Atrial septum: No defect or patent foramen ovale was identified. Echo contrast study showed no right-to-left atrial level shunt, following an increase in RA pressure induced by provocative maneuvers.    Neuro/Psych  Headaches, Anxiety Depression parkinson's TIACVA (L sided weakness), Residual Symptoms    GI/Hepatic Neg liver ROS, GERD  Medicated and Controlled,  Endo/Other  negative endocrine ROS  Renal/GU H/o stones     Musculoskeletal  (+) Arthritis ,   Abdominal   Peds  Hematology plavix   Anesthesia Other Findings   Reproductive/Obstetrics                            Anesthesia Physical Anesthesia Plan  ASA: 3  Anesthesia Plan: MAC   Post-op Pain Management:    Induction:   PONV Risk Score and Plan: 1 and Ondansetron and Treatment may vary due to age or medical condition  Airway Management Planned: Natural Airway and Simple Face Mask  Additional Equipment: None  Intra-op Plan:   Post-operative Plan:   Informed Consent: I have reviewed the patients History and Physical, chart, labs  and discussed the procedure including the risks, benefits and alternatives for the proposed anesthesia with the patient or authorized representative who has indicated his/her understanding and acceptance.     Dental advisory given  Plan Discussed with: CRNA and Surgeon  Anesthesia Plan Comments:        Anesthesia Quick Evaluation

## 2021-06-08 NOTE — Transfer of Care (Signed)
Immediate Anesthesia Transfer of Care Note  Patient: Paul Drake  Procedure(s) Performed: LEFT VOLAR GANGLION EXCISION (Left: Wrist)  Patient Location: PACU  Anesthesia Type:General  Level of Consciousness: awake, alert  and oriented  Airway & Oxygen Therapy: Patient Spontanous Breathing and Patient connected to face mask oxygen  Post-op Assessment: Report given to RN and Post -op Vital signs reviewed and stable  Post vital signs: Reviewed and stable  Last Vitals:  Vitals Value Taken Time  BP 141/80   Temp    Pulse 81 06/08/21 1254  Resp    SpO2 99 % 06/08/21 1254  Vitals shown include unvalidated device data.  Last Pain:  Vitals:   06/08/21 1049  TempSrc: Oral  PainSc: 4          Complications: No notable events documented.

## 2021-06-08 NOTE — Interval H&P Note (Signed)
History and Physical Interval Note:  06/08/2021 11:44 AM  Paul Drake  has presented today for surgery, with the diagnosis of Left Wrist Volar Ganglion.  The various methods of treatment have been discussed with the patient and family. After consideration of risks, benefits and other options for treatment, the patient has consented to  Procedure(s): [place] (Left) as a surgical intervention.  The patient's history has been reviewed, patient examined, no change in status, stable for surgery.  I have reviewed the patient's chart and labs.  Questions were answered to the patient's satisfaction.     [name]

## 2021-06-08 NOTE — Anesthesia Procedure Notes (Signed)
Procedure Name: LMA Insertion Date/Time: 06/08/2021 12:01 PM Performed by: Annye Asa, MD Pre-anesthesia Checklist: Patient identified, Emergency Drugs available and Suction available Patient Re-evaluated:Patient Re-evaluated prior to induction Oxygen Delivery Method: Circle system utilized Preoxygenation: Pre-oxygenation with 100% oxygen Induction Type: IV induction Ventilation: Mask ventilation without difficulty LMA: LMA inserted LMA Size: 4.0 Number of attempts: 1 Dental Injury: Teeth and Oropharynx as per pre-operative assessment

## 2021-06-09 ENCOUNTER — Other Ambulatory Visit: Payer: Self-pay | Admitting: Orthopedic Surgery

## 2021-06-09 ENCOUNTER — Telehealth: Payer: Self-pay | Admitting: Orthopedic Surgery

## 2021-06-09 NOTE — Telephone Encounter (Signed)
Patient called advised he is having a reaction to Oxycodone. Patient said he is nauseated and vomited last night. Patient said he stopped taking the medication. Patient asked if there is something else he can take? The number to contact patient is [phone]

## 2021-06-12 ENCOUNTER — Other Ambulatory Visit (HOSPITAL_BASED_OUTPATIENT_CLINIC_OR_DEPARTMENT_OTHER): Payer: Self-pay

## 2021-06-12 DIAGNOSIS — Z23 Encounter for immunization: Secondary | ICD-10-CM | POA: Diagnosis not present

## 2021-06-12 LAB — SURGICAL PATHOLOGY

## 2021-06-12 MED ORDER — COVID-19MRNA BIVAL VACC PFIZER 30 MCG/0.3ML IM SUSP
INTRAMUSCULAR | 0 refills | Status: DC
  Filled 2021-06-12: qty 0.3, 1d supply, fill #0

## 2021-06-13 ENCOUNTER — Encounter (HOSPITAL_BASED_OUTPATIENT_CLINIC_OR_DEPARTMENT_OTHER): Payer: Self-pay | Admitting: Orthopedic Surgery

## 2021-06-19 ENCOUNTER — Ambulatory Visit (INDEPENDENT_AMBULATORY_CARE_PROVIDER_SITE_OTHER): Payer: Medicare Other | Admitting: Orthopedic Surgery

## 2021-06-19 ENCOUNTER — Encounter: Payer: Self-pay | Admitting: Orthopedic Surgery

## 2021-06-19 ENCOUNTER — Other Ambulatory Visit (HOSPITAL_BASED_OUTPATIENT_CLINIC_OR_DEPARTMENT_OTHER): Payer: Self-pay

## 2021-06-19 ENCOUNTER — Ambulatory Visit (INDEPENDENT_AMBULATORY_CARE_PROVIDER_SITE_OTHER): Payer: Medicare Other | Admitting: Family Medicine

## 2021-06-19 ENCOUNTER — Other Ambulatory Visit: Payer: Self-pay

## 2021-06-19 VITALS — BP 122/64 | HR 88 | Temp 97.6°F | Resp 16 | Wt 177.6 lb

## 2021-06-19 DIAGNOSIS — M79645 Pain in left finger(s): Secondary | ICD-10-CM

## 2021-06-19 DIAGNOSIS — R739 Hyperglycemia, unspecified: Secondary | ICD-10-CM

## 2021-06-19 DIAGNOSIS — I63411 Cerebral infarction due to embolism of right middle cerebral artery: Secondary | ICD-10-CM | POA: Diagnosis not present

## 2021-06-19 DIAGNOSIS — G629 Polyneuropathy, unspecified: Secondary | ICD-10-CM | POA: Insufficient documentation

## 2021-06-19 DIAGNOSIS — R7989 Other specified abnormal findings of blood chemistry: Secondary | ICD-10-CM | POA: Diagnosis not present

## 2021-06-19 DIAGNOSIS — Z8673 Personal history of transient ischemic attack (TIA), and cerebral infarction without residual deficits: Secondary | ICD-10-CM | POA: Diagnosis not present

## 2021-06-19 DIAGNOSIS — F418 Other specified anxiety disorders: Secondary | ICD-10-CM

## 2021-06-19 DIAGNOSIS — I10 Essential (primary) hypertension: Secondary | ICD-10-CM

## 2021-06-19 DIAGNOSIS — D539 Nutritional anemia, unspecified: Secondary | ICD-10-CM

## 2021-06-19 DIAGNOSIS — G20 Parkinson's disease: Secondary | ICD-10-CM

## 2021-06-19 DIAGNOSIS — G6289 Other specified polyneuropathies: Secondary | ICD-10-CM

## 2021-06-19 DIAGNOSIS — M67432 Ganglion, left wrist: Secondary | ICD-10-CM

## 2021-06-19 DIAGNOSIS — E785 Hyperlipidemia, unspecified: Secondary | ICD-10-CM

## 2021-06-19 MED ORDER — ZOSTER VAC RECOMB ADJUVANTED 50 MCG/0.5ML IM SUSR
INTRAMUSCULAR | 0 refills | Status: DC
  Filled 2021-06-19: qty 1, 1d supply, fill #0

## 2021-06-19 NOTE — Assessment & Plan Note (Signed)
Encourage heart healthy diet such as MIND or DASH diet, increase exercise, avoid trans fats, simple carbohydrates and processed foods, consider a krill or fish or flaxseed oil cap daily.  °

## 2021-06-19 NOTE — Assessment & Plan Note (Signed)
Pain in all five fingers, worse in evening, feels like his fingers are getting cut with glass.

## 2021-06-19 NOTE — Progress Notes (Signed)
Subjective:   By signing my name below, I, Zite Okoli, attest that this documentation has been prepared under the direction and in the presence of Mosie Lukes, MD. 06/19/2021     Patient ID: Paul Drake, male    DOB: [birth year], 75 y.o.   MRN: [id number]  Chief Complaint  Patient presents with   4 months follow up    HPI Patient is in today for an office visit.  Patient is left-handed. He had a cyst on his left wrist drained at Ortho and removed the sutures today. He still experiences left hand pain that radiates from the center of his palm to all 5 of his fingers. He mentions the pain always starts in the evening and has stopped drinking in the evening which helps to manage the pain and lets him get some sleep. He has not been taking vitamin B12 pills consistently.  He reports that the neuropathy in his left leg is getting worse and is secondary to his stroke.  He mentions that he still gets occasional bouts of constipation and often goes up to 5 days in between bowel movements.   He recently got the flu vaccine. He is interested in getting the shingles vaccine today. He has 4 Pfizer Covid-19 vaccines at this time.  Past Medical History:  Diagnosis Date   Allergic state 12/29/2013   Arthritis    Barrett's esophagus    BPH (benign prostatic hyperplasia)    Chicken pox    CVA (cerebral infarction)    Depression    GERD (gastroesophageal reflux disease)    History of kidney stones    HTN (hypertension), benign 04/01/2015   Hyperglycemia 01/06/2015   Hyperlipemia    Hypertension    IBS (irritable bowel syndrome)    Left hand pain 07/21/2017   Measles as a child   Medicare annual wellness visit, subsequent 06/26/2015   Mumps as a child   Otitis externa 12/10/2013   Seasonal allergies    some asthma   Stroke [facility])    Unspecified asthma(493.90) 12/29/2013    Past Surgical History:  Procedure Laterality Date   COLONOSCOPY     EP IMPLANTABLE DEVICE N/A 10/16/2016    Procedure: Loop Recorder Insertion;  Surgeon: Thompson Grayer, MD;  Location: [facility];  Service: Cardiovascular;  Laterality: N/A;   GANGLION CYST EXCISION Left 06/08/2021   Procedure: LEFT VOLAR GANGLION EXCISION;  Surgeon: Sherilyn Cooter, MD;  Location: [facility];  Service: Orthopedics;  Laterality: Left;   implantable loop recorder removal  09/21/2020   Reveal LINQ removed in office   LITHOTRIPSY     multiple times   PROSTATE SURGERY  08/2017   TEE WITHOUT CARDIOVERSION N/A 09/22/2014   Procedure: TRANSESOPHAGEAL ECHOCARDIOGRAM (TEE);  Surgeon: Josue Hector, MD;  [facility] ENDOSCOPY;  Service: Cardiovascular;  Laterality: N/A;   WISDOM TOOTH EXTRACTION      Family History  Problem Relation Age of Onset   Hypertension Mother    Hyperlipidemia Mother    Fibromyalgia Mother    Arthritis Mother        rheumatoid   Diabetes Sister        type 2   Hyperlipidemia Brother    Hypertension Brother    Prostate cancer Brother    Ulcers Father 72       Bleeding Ulcers   Kidney Stones Daughter    Asthma Daughter    Healthy Son    Cancer Maternal Grandfather  skin ?   Stroke Maternal Aunt    Cancer Maternal Uncle        prostate    Social History   Socioeconomic History   Marital status: Married    Spouse name: Webb Silversmith   Number of children: 3   Years of education: college   Highest education level: Master's degree (e.g., MA, MS, MEng, MEd, MSW, MBA)  Occupational History   Occupation: retired    Comment: retired  Tobacco Use   Smoking status: Never   Smokeless tobacco: Never  Vaping Use   Vaping Use: Never used  Substance and Sexual Activity   Alcohol use: Yes    Alcohol/week: 1.0 standard drink    Types: 1 Shots of liquor per week    Comment: daily   Drug use: No   Sexual activity: Yes    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 They are living in their daughter's house to help to help take care of their granddaughter.      Patient uses a home gym 3-4 x a week.      Social Determinants of Health   Financial Resource Strain: Not on file  Food Insecurity: Not on file  Transportation Needs: Not on file  Physical Activity: Not on file  Stress: Not on file  Social Connections: Not on file  Intimate Partner Violence: Not on file    Outpatient Medications Prior to Visit  Medication Sig Dispense Refill   albuterol (VENTOLIN HFA) 108 (90 Base) MCG/ACT inhaler Inhale 2 puffs into the lungs every 6 (six) hours as needed for wheezing or shortness of breath. 1 each 3   alfuzosin (UROXATRAL) 10 MG 24 hr tablet Take 10 mg by mouth daily with breakfast.     carbidopa-levodopa (SINEMET IR) 25-100 MG tablet Take 1 tablet by mouth 3 (three) times daily. 270 tablet 0   clonazePAM (KLONOPIN) 0.5 MG tablet TAKE 1 TABLET(0.5 MG) BY MOUTH DAILY 90 tablet 0   clopidogrel (PLAVIX) 75 MG tablet TAKE 1 TABLET(75 MG) BY MOUTH DAILY 90 tablet 3   famotidine (PEPCID) 40 MG tablet TAKE 1 TABLET(40 MG) BY MOUTH AT BEDTIME AS NEEDED FOR HEARTBURN OR INDIGESTION 30 tablet 5   finasteride (PROSCAR) 5 MG tablet Take 5 mg by mouth daily.     fluticasone (FLONASE) 50 MCG/ACT nasal spray SHAKE LIQUID AND USE 2 SPRAYS IN EACH NOSTRIL DAILY 16 g 5   gabapentin (NEURONTIN) 100 MG capsule 1 cap po q 4 hours 540 capsule 1   levocetirizine (XYZAL) 5 MG tablet Take 1 tablet (5 mg total) by mouth every evening. 30 tablet 2   Menthol, Topical Analgesic, (BIOFREEZE EX) Apply 1 application topically daily as needed (hand pain).     Multiple Vitamins-Minerals (MULTIVITAMINS THER. W/MINERALS) TABS tablet Take 1 tablet by mouth daily.     NONFORMULARY OR COMPOUNDED ITEM Neuropathic cream - apply 1 to 2 g to left hand 3-4 times daily as needed 1 each 3   Omega-3 Fatty Acids (OMEGA-3  PO) Take 350 mg by mouth daily.     pantoprazole (PROTONIX) 40 MG tablet TAKE 1 TABLET(40 MG) BY MOUTH DAILY 90 tablet 3   simvastatin (ZOCOR) 20 MG tablet TAKE 1 TABLET(20 MG) BY MOUTH DAILY 90 tablet 1   venlafaxine  XR (EFFEXOR-XR) 150 MG 24 hr capsule Take 1 capsule (150 mg total) by mouth at bedtime. 90 capsule 1   COVID-19 mRNA bivalent vaccine, Pfizer, injection Inject into the muscle. 0.3 mL 0   influenza vaccine adjuvanted (FLUAD) 0.5 ML injection Inject into the muscle. 0.5 mL 0   COVID-19 mRNA Vac-TriS, Pfizer, (PFIZER-BIONT COVID-19 VAC-TRIS) SUSP injection Inject into the muscle. 0.3 mL 0   influenza vaccine adjuvanted (FLUAD) 0.5 ML injection Inject into the muscle. 0.5 mL 0   No facility-administered medications prior to visit.    Allergies  Allergen Reactions   Molds & Smuts Other (See Comments)   Pollen Extract Other (See Comments)    Runny nose,itchy eyes (trees, grasses, etc)     Review of Systems  Constitutional:  Negative for fever and malaise/fatigue.  HENT:  Negative for congestion.   Eyes:  Negative for redness.  Respiratory:  Negative for shortness of breath.   Cardiovascular:  Negative for chest pain, palpitations and leg swelling.  Gastrointestinal:  Positive for constipation. Negative for abdominal pain, blood in stool and nausea.  Genitourinary:  Negative for dysuria and frequency.  Musculoskeletal:  Negative for falls.       (+) left hand pain  Skin:  Negative for rash.  Neurological:  Negative for dizziness, loss of consciousness and headaches.       (+) numbness in left leg  Endo/Heme/Allergies:  Negative for polydipsia.  Psychiatric/Behavioral:  Negative for depression. The patient is not nervous/anxious.       Objective:    Physical Exam Constitutional:      Appearance: Normal appearance. He is not ill-appearing.  HENT:     Head: Normocephalic and atraumatic.     Right Ear: Tympanic membrane, ear canal and external ear normal.     Left Ear:  Tympanic membrane, ear canal and external ear normal.  Eyes:     Conjunctiva/sclera: Conjunctivae normal.  Cardiovascular:     Rate and Rhythm: Normal rate and regular rhythm.     Heart sounds: Normal heart sounds. No murmur heard. Pulmonary:     Breath sounds: Normal breath sounds. No wheezing.  Abdominal:     General: Bowel sounds are normal. There is no distension.     Palpations: Abdomen is soft.     Tenderness: There is no abdominal tenderness.     Hernia: No hernia is present.  Musculoskeletal:     Cervical back: Neck supple.  Lymphadenopathy:     Cervical: No cervical adenopathy.  Skin:    General: Skin is warm and dry.  Neurological:     Mental Status: He is alert and oriented to person, place, and time.  Psychiatric:        Behavior: Behavior normal.    BP 122/64   Pulse 88   Temp 97.6 F (36.4 C)   Resp 16   Wt 177 lb 9.6 oz (80.6 kg)   SpO2 97%   BMI 27.00 kg/m  Wt Readings from Last 3 Encounters:  06/19/21 177 lb 9.6 oz (80.6 kg)  06/08/21 173 lb 11.6 oz (78.8 kg)  05/23/21 176 lb 6.4 oz (80 kg)    Diabetic Foot Exam - Simple   No data filed    Lab Results  Component Value Date   WBC 5.2 06/05/2021   HGB 12.8 (L) 06/05/2021   HCT 38.7 (L) 06/05/2021   PLT 169.0 06/05/2021   GLUCOSE 105 (H) 06/05/2021   CHOL 145 06/05/2021   TRIG 83.0 06/05/2021  HDL 61.70 06/05/2021   LDLDIRECT 76.0 12/31/2014   LDLCALC 67 06/05/2021   ALT 4 06/05/2021   AST 15 06/05/2021   NA 142 06/05/2021   K 4.3 06/05/2021   CL 105 06/05/2021   CREATININE 1.22 06/05/2021   BUN 18 06/05/2021   CO2 31 06/05/2021   TSH 1.54 06/05/2021   PSA 1.22 01/05/2021   INR 0.98 09/20/2014   HGBA1C 5.7 06/05/2021    Lab Results  Component Value Date   TSH 1.54 06/05/2021   Lab Results  Component Value Date   WBC 5.2 06/05/2021   HGB 12.8 (L) 06/05/2021   HCT 38.7 (L) 06/05/2021   MCV 98.2 06/05/2021   PLT 169.0 06/05/2021   Lab Results  Component Value Date   NA  142 06/05/2021   K 4.3 06/05/2021   CO2 31 06/05/2021   GLUCOSE 105 (H) 06/05/2021   BUN 18 06/05/2021   CREATININE 1.22 06/05/2021   BILITOT 0.4 06/05/2021   ALKPHOS 68 06/05/2021   AST 15 06/05/2021   ALT 4 06/05/2021   PROT 6.6 06/05/2021   ALBUMIN 4.1 06/05/2021   CALCIUM 9.4 06/05/2021   ANIONGAP 7 09/14/2016   GFR 57.39 (L) 06/05/2021   Lab Results  Component Value Date   CHOL 145 06/05/2021   Lab Results  Component Value Date   HDL 61.70 06/05/2021   Lab Results  Component Value Date   LDLCALC 67 06/05/2021   Lab Results  Component Value Date   TRIG 83.0 06/05/2021   Lab Results  Component Value Date   CHOLHDL 2 06/05/2021   Lab Results  Component Value Date   HGBA1C 5.7 06/05/2021       Assessment & Plan:   Problem List Items Addressed This Visit     Depression with anxiety    Still struggles with anhedonia but over all feels stable on current meds      History of CVA (cerebrovascular accident)    Continues to struggle with left sided numbness and weakness in leg and arm      Essential hypertension    Well controlled, no changes to meds. Encouraged heart healthy diet such as the DASH diet and exercise as tolerated.       Relevant Orders   CBC   Comprehensive metabolic panel   TSH   Hyperglycemia - Primary    hgba1c acceptable, minimize simple carbs. Increase exercise as tolerated.       Relevant Orders   Hemoglobin A1c   HLD (hyperlipidemia)    Encourage heart healthy diet such as MIND or DASH diet, increase exercise, avoid trans fats, simple carbohydrates and processed foods, consider a krill or fish or flaxseed oil cap daily.       Relevant Orders   Lipid panel   Elevated testosterone level in male   Relevant Orders   Testosterone   Parkinson's disease ([place])    Stable continues to follow with neurology      Finger pain, left    Pain in all five fingers, worse in evening, feels like his fingers are getting cut with glass.        Peripheral neuropathy    L foot secondary to CVA      Macrocytic anemia    Start a multivitamin with minerals and repeat labs in 3 months      Relevant Orders   CBC   Vitamin B12   RESOLVED: H/O: stroke    No orders of the defined types were  placed in this encounter.   I,Zite Okoli,acting as a Education administrator for Penni Homans, MD.,have documented all relevant documentation on the behalf of Penni Homans, MD,as directed by  Penni Homans, MD while in the presence of Penni Homans, MD.   I, Mosie Lukes, MD., personally preformed the services described in this documentation.  All medical record entries made by the scribe were at my direction and in my presence.  I have reviewed the chart and discharge instructions (if applicable) and agree that the record reflects my personal performance and is accurate and complete. 06/19/2021

## 2021-06-19 NOTE — Progress Notes (Signed)
Post-Op Visit Note   Patient: Paul Drake           Date of Birth: 1946/03/19           MRN: [id number] Visit Date: 06/19/2021 PCP: Mosie Lukes, MD   Assessment & Plan:  Chief Complaint:  Chief Complaint  Patient presents with   Left Wrist - Routine Post Op    Ganglion cyst removal 06/08/21   Visit Diagnoses:  1. Ganglion cyst of volar aspect of left wrist     Plan: His incision is healing well.  Sutures removed in the office today.  Mild swelling at surgical site.  Surgical pathology consistent with ganglion cyst.  He can follow up with me as needed.   Follow-Up Instructions: No follow-ups on file.   Orders:  No orders of the defined types were placed in this encounter.  No orders of the defined types were placed in this encounter.   Imaging: No results found.  PMFS History: Patient Active Problem List   Diagnosis Date Noted   Ganglion cyst of volar aspect of left wrist 05/23/2021   Sinusitis 02/02/2021   Urinary frequency 06/27/2020   Finger pain, left 04/15/2020   Tinnitus of both ears 02/08/2020   Headache 02/08/2020   Parkinson's disease ([place]) 11/29/2019   Ganglion cyst of dorsum of left wrist 05/19/2018   Elevated testosterone level in male 05/19/2018   REM behavioral disorder 01/27/2018   Asthma 10/30/2017   Vertebrobasilar artery syndrome 07/29/2017   Left hand pain 07/21/2017   Chronic deep vein thrombosis (DVT) of popliteal vein of right lower extremity ([place]) 01/17/2017   TIA (transient ischemic attack) 09/26/2016   HLD (hyperlipidemia) 02/14/2016   Cerebrovascular accident (CVA) due to embolism of right middle cerebral artery ([place]) 08/15/2015   Medicare annual wellness visit, subsequent 06/26/2015   Eustachian tube dysfunction 03/11/2015   Hyperglycemia 01/06/2015   Allergic rhinitis 12/15/2014   Otitis media 12/15/2014   Vertigo 12/15/2014   PFO (patent foramen ovale) 11/24/2014   Essential hypertension 11/24/2014   DVT, lower extremity  ([place]) 10/04/2014   Stroke ([place]) 09/20/2014   Barrett's esophagus 08/16/2014   Lumbago 08/16/2014   IBS (irritable bowel syndrome) 08/16/2014   Abdominal pain 07/06/2014   Allergic state 12/29/2013   Unspecified asthma(493.90) 12/29/2013   GERD (gastroesophageal reflux disease)    Need for prophylactic vaccination with combined diphtheria-tetanus-pertussis (DTP) vaccine 11/15/2013   BPH (benign prostatic hyperplasia) 11/15/2013   Renal lithiasis 11/15/2013   Depression with anxiety 11/15/2013   Anosmia 11/15/2013   History of CVA (cerebrovascular accident) 11/15/2013   [place] arthritis, thumb, degenerative 11/04/2013   Past Medical History:  Diagnosis Date   Allergic state 12/29/2013   Arthritis    Barrett's esophagus    BPH (benign prostatic hyperplasia)    Chicken pox    CVA (cerebral infarction)    Depression    GERD (gastroesophageal reflux disease)    History of kidney stones    HTN (hypertension), benign 04/01/2015   Hyperglycemia 01/06/2015   Hyperlipemia    Hypertension    IBS (irritable bowel syndrome)    Left hand pain 07/21/2017   Measles as a child   Medicare annual wellness visit, subsequent 06/26/2015   Mumps as a child   Otitis externa 12/10/2013   Seasonal allergies    some asthma   Stroke [facility])    Unspecified asthma(493.90) 12/29/2013    Family History  Problem Relation Age of Onset   Hypertension Mother  Hyperlipidemia Mother    Fibromyalgia Mother    Arthritis Mother        rheumatoid   Diabetes Sister        type 2   Hyperlipidemia Brother    Hypertension Brother    Prostate cancer Brother    Ulcers Father 35       Bleeding Ulcers   Kidney Stones Daughter    Asthma Daughter    Healthy Son    Cancer Maternal Grandfather        skin ?   Stroke Maternal Aunt    Cancer Maternal Uncle        prostate    Past Surgical History:  Procedure Laterality Date   COLONOSCOPY     EP IMPLANTABLE DEVICE N/A 10/16/2016   Procedure: Loop Recorder  Insertion;  Surgeon: Thompson Grayer, MD;  Location: [facility] CV [facility];  Service: Cardiovascular;  Laterality: N/A;   GANGLION CYST EXCISION Left 06/08/2021   Procedure: LEFT VOLAR GANGLION EXCISION;  Surgeon: Sherilyn Cooter, MD;  Location: [facility];  Service: Orthopedics;  Laterality: Left;   implantable loop recorder removal  09/21/2020   Reveal LINQ removed in office   LITHOTRIPSY     multiple times   PROSTATE SURGERY  08/2017   TEE WITHOUT CARDIOVERSION N/A 09/22/2014   Procedure: TRANSESOPHAGEAL ECHOCARDIOGRAM (TEE);  Surgeon: Josue Hector, MD;  Location: [facility] ENDOSCOPY;  Service: Cardiovascular;  Laterality: N/A;   WISDOM TOOTH EXTRACTION     Social History   Occupational History   Occupation: retired    Comment: retired  Tobacco Use   Smoking status: Never   Smokeless tobacco: Never  Vaping Use   Vaping Use: Never used  Substance and Sexual Activity   Alcohol use: Yes    Alcohol/week: 1.0 standard drink    Types: 1 Shots of liquor per week    Comment: daily   Drug use: No   Sexual activity: Yes    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

## 2021-06-19 NOTE — Assessment & Plan Note (Signed)
Start a multivitamin with minerals and repeat labs in 3 months

## 2021-06-19 NOTE — Assessment & Plan Note (Signed)
Stable continues to follow with neurology

## 2021-06-19 NOTE — Assessment & Plan Note (Signed)
L foot secondary to CVA

## 2021-06-19 NOTE — Assessment & Plan Note (Signed)
Continues to struggle with left sided numbness and weakness in leg and arm

## 2021-06-19 NOTE — Assessment & Plan Note (Signed)
Well controlled, no changes to meds. Encouraged heart healthy diet such as the DASH diet and exercise as tolerated.  °

## 2021-06-19 NOTE — Assessment & Plan Note (Deleted)
Continues to struggle with left sided numbness and weakness in leg and arm

## 2021-06-19 NOTE — Assessment & Plan Note (Signed)
hgba1c acceptable, minimize simple carbs. Increase exercise as tolerated.  

## 2021-06-19 NOTE — Assessment & Plan Note (Signed)
Still struggles with anhedonia but over all feels stable on current meds

## 2021-06-19 NOTE — Patient Instructions (Addendum)
Shingrix is the new shingles shot, 2 shots over 2-6 months, confirm coverage with insurance and document, then can return here for shots with nurse appt or at pharmacy    Paxlovid  and [place] are the new COVID medication we can give you if you get COVID so make sure you test if you have symptoms because we have to treat by day 5 of symptoms for it to be effective. If you are positive let us know so we can treat. If a home test is negative and your symptoms are persistent get a PCR test. Can check testing locations at [email or web address] If you are positive we will make an appointment with [place] and we will send in Paxlovid if you would like it. Check with your pharmacy before we meet to confirm they have it in stock, if they do not then we can get the prescription at the [facility]   Encouraged increased hydration and fiber in diet. Daily probiotics. If bowels not moving can use MOM 2 tbls po in 4 oz of warm prune juice by mouth every 2-3 days. If no results then repeat in 4 hours with  Dulcolax suppository pr, may repeat again in 4 more hours as needed. Seek care if symptoms worsen. Consider daily Miralax and/or Dulcolax if symptoms persist.    Miralax with Benefiber once to twice daily

## 2021-06-21 ENCOUNTER — Other Ambulatory Visit (HOSPITAL_BASED_OUTPATIENT_CLINIC_OR_DEPARTMENT_OTHER): Payer: Self-pay

## 2021-06-22 ENCOUNTER — Other Ambulatory Visit: Payer: Self-pay | Admitting: Neurology

## 2021-07-02 ENCOUNTER — Other Ambulatory Visit: Payer: Self-pay | Admitting: Family Medicine

## 2021-07-03 DIAGNOSIS — N200 Calculus of kidney: Secondary | ICD-10-CM | POA: Diagnosis not present

## 2021-07-03 DIAGNOSIS — N401 Enlarged prostate with lower urinary tract symptoms: Secondary | ICD-10-CM | POA: Diagnosis not present

## 2021-07-03 DIAGNOSIS — R350 Frequency of micturition: Secondary | ICD-10-CM | POA: Diagnosis not present

## 2021-07-03 DIAGNOSIS — R3915 Urgency of urination: Secondary | ICD-10-CM | POA: Diagnosis not present

## 2021-07-04 ENCOUNTER — Ambulatory Visit: Payer: Medicare Other | Admitting: Family

## 2021-07-06 ENCOUNTER — Telehealth: Payer: Self-pay | Admitting: Family Medicine

## 2021-07-06 NOTE — Telephone Encounter (Signed)
Pt is scheduled with Dr. Nani Ravens tomorrow.

## 2021-07-06 NOTE — Telephone Encounter (Signed)
Who Is Calling Patient / Member / Family / Caregiver Call Type Triage / Clinical Relationship To Patient Self Return Phone Number [phone] (Primary) Chief Complaint FAINTING or [place] Reason for Call Symptomatic / Request for [place] states, having problems with high blood pressure. BP was 134/81, On losartan, to get BP down. In the evening comes back feeling dizzy and a feeling like passing out. Also has an active kidney stone. Also seems to be anemic. On meds for that. Translation No Nurse Assessment Nurse: Altamease Oiler, RN, Adriana Date/Time (Eastern Time): 07/06/2021 9:59:32 AM Confirm and document reason for call. If symptomatic, describe symptoms. ---pt states he is having issues with his blood pressure. has been having high bp readings, dizziness and nausea. has a kidney stone and unsure if causing nausea. at night he feels like he is about to pass out when standing up. feeling ok at this time. no sx currently. 134/81 last bp 30 min ago. started taking losartan again after not taking it for a while as the bp was low.  07/06/2021 10:11:16 AM See PCP within 24 Hours Yes Altamease Oiler, Therapist, sports, Fabio Bering

## 2021-07-06 NOTE — Telephone Encounter (Signed)
Pt. Called in and stated he has been having issues with high blood pressure.For the last few days every time he stands up he almost faints. It is now getting worse. Transferred pt to triage for further advice.

## 2021-07-07 ENCOUNTER — Encounter: Payer: Self-pay | Admitting: Family Medicine

## 2021-07-07 ENCOUNTER — Ambulatory Visit (INDEPENDENT_AMBULATORY_CARE_PROVIDER_SITE_OTHER): Payer: Medicare Other | Admitting: Family Medicine

## 2021-07-07 ENCOUNTER — Other Ambulatory Visit: Payer: Self-pay

## 2021-07-07 VITALS — BP 128/80 | HR 72 | Temp 97.7°F | Resp 18 | Ht 67.5 in | Wt 176.2 lb

## 2021-07-07 DIAGNOSIS — R42 Dizziness and giddiness: Secondary | ICD-10-CM | POA: Diagnosis not present

## 2021-07-07 LAB — COMPREHENSIVE METABOLIC PANEL
ALT: 7 U/L (ref 0–53)
AST: 15 U/L (ref 0–37)
Albumin: 4.2 g/dL (ref 3.5–5.2)
Alkaline Phosphatase: 79 U/L (ref 39–117)
BUN: 21 mg/dL (ref 6–23)
CO2: 31 mEq/L (ref 19–32)
Calcium: 9.5 mg/dL (ref 8.4–10.5)
Chloride: 102 mEq/L (ref 96–112)
Creatinine, Ser: 1.23 mg/dL (ref 0.40–1.50)
GFR: 56.79 mL/min — ABNORMAL LOW (ref >60.00)
Glucose, Bld: 93 mg/dL (ref 70–99)
Potassium: 4.4 mEq/L (ref 3.5–5.1)
Sodium: 139 mEq/L (ref 135–145)
Total Bilirubin: 0.6 mg/dL (ref 0.2–1.2)
Total Protein: 6.5 g/dL (ref 6.0–8.3)

## 2021-07-07 LAB — CBC
HCT: 38.8 % — ABNORMAL LOW (ref 39.0–52.0)
Hemoglobin: 13 g/dL (ref 13.0–17.0)
MCHC: 33.6 g/dL (ref 30.0–36.0)
MCV: 97.2 fl (ref 78.0–100.0)
Platelets: 179 10*3/uL (ref 150.0–400.0)
RBC: 3.99 Mil/uL — ABNORMAL LOW (ref 4.22–5.81)
RDW: 12.5 % (ref 11.5–15.5)
WBC: 5.5 10*3/uL (ref 4.0–10.5)

## 2021-07-07 NOTE — Progress Notes (Signed)
Chief Complaint  Patient presents with   Dizziness    Worse in the evening. He does have some nausea. He was seen by Urology on Monday 07/03/21 and was told he has blood in his urine.     Paul Drake is 75 y.o. pt here for dizziness.  Duration: 4 days Pass out? No; feels like he could Describes as lightheadedness, worse in the evening. No issues moving his head. Worse when he stands up, not every time though.  Last for a few seconds before normalizing. Spinning? No Recent illness/fever? No Headache? No Neurologic signs? No Change in PO intake? No Was told he has blood in his urine at urology due to stones.  He does take Uroxatral for urinary urgency.   Past Medical History:  Diagnosis Date   Allergic state 12/29/2013   Arthritis    Barrett's esophagus    BPH (benign prostatic hyperplasia)    Chicken pox    CVA (cerebral infarction)    Depression    GERD (gastroesophageal reflux disease)    History of kidney stones    HTN (hypertension), benign 04/01/2015   Hyperglycemia 01/06/2015   Hyperlipemia    Hypertension    IBS (irritable bowel syndrome)    Left hand pain 07/21/2017   Measles as a child   Medicare annual wellness visit, subsequent 06/26/2015   Mumps as a child   Otitis externa 12/10/2013   Seasonal allergies    some asthma   Stroke [facility])    Unspecified asthma(493.90) 12/29/2013    Family History  Problem Relation Age of Onset   Hypertension Mother    Hyperlipidemia Mother    Fibromyalgia Mother    Arthritis Mother        rheumatoid   Diabetes Sister        type 2   Hyperlipidemia Brother    Hypertension Brother    Prostate cancer Brother    Ulcers Father 20       Bleeding Ulcers   Kidney Stones Daughter    Asthma Daughter    Healthy Son    Cancer Maternal Grandfather        skin ?   Stroke Maternal Aunt    Cancer Maternal Uncle        prostate     BP 128/80 (BP Location: Left Arm, Patient Position: Sitting, Cuff Size: Normal)   Pulse 72    Temp 97.7 F (36.5 C) (Oral)   Resp 18   Ht 5' 7.5" (1.715 m)   Wt 176 lb 3.2 oz (79.9 kg)   SpO2 97%   BMI 27.19 kg/m  General: Awake, alert, appears stated age Eyes: PERRLA, EOMi Ears: Patent, TM's neg b/l Heart: RRR, no murmurs, no carotid bruits Lungs: CTAB, no accessory muscle use MSK: 5/5 strength throughout, gait normal Neuro: No cerebellar signs, patellar reflex 1/4 b/l wo clonus, calcaneal reflex 0/4 b/l wo clonus, biceps reflex 1/4 b/l wo clonus; gait is normal Psych: Age appropriate judgment and insight, normal mood and affect  Lightheadedness - Plan: CBC, Comprehensive metabolic panel  New problem, uncertain prognosis.  Check above labs as this could be related to blood loss anemia given the blood seen in urine at urology.  Stay hydrated.  Get up slowly.  We will stop Uroxatrol as this seems like orthostasis.  He has restarted losartan which we will have him stop again.  Dr. Charlett Blake did not have him on this medication but he did have a leftover supply and thought it may  help. F/u prn. Pt voiced understanding and agreement to the plan.  [place], DO 07/07/21 11:59 AM

## 2021-07-07 NOTE — Patient Instructions (Addendum)
Give [place] 2-3 business days to get the results of your labs back.   Stay hydrated.   Get up slowly.   Stop the alfuzosin. Stay off the losartan as well.   Let us know if you need anything.

## 2021-07-19 ENCOUNTER — Other Ambulatory Visit: Payer: Self-pay | Admitting: Neurology

## 2021-07-19 ENCOUNTER — Telehealth: Payer: Self-pay | Admitting: Neurology

## 2021-07-19 MED ORDER — CLONAZEPAM 0.5 MG PO TABS: 0.5000 mg | ORAL_TABLET | Freq: Every day | ORAL | 0 refills | Status: DC

## 2021-07-19 NOTE — Telephone Encounter (Signed)
Called patient and he said that medication was refused and so he thought he was unable to keep taking it. I let patient know we will send that in for him and if he has any more trouble like that to call the office right away and let us know

## 2021-07-19 NOTE — Telephone Encounter (Signed)
Pt called to let Tat know he is having strong reoccurrences acting out dreams. He threw himself out of the bed onto the floor. He was on clonopin before and said he thought that helped some. Would like to know what can be done

## 2021-07-31 DIAGNOSIS — R350 Frequency of micturition: Secondary | ICD-10-CM | POA: Diagnosis not present

## 2021-07-31 DIAGNOSIS — N281 Cyst of kidney, acquired: Secondary | ICD-10-CM | POA: Diagnosis not present

## 2021-07-31 DIAGNOSIS — R1032 Left lower quadrant pain: Secondary | ICD-10-CM | POA: Diagnosis not present

## 2021-07-31 DIAGNOSIS — R8271 Bacteriuria: Secondary | ICD-10-CM | POA: Diagnosis not present

## 2021-07-31 DIAGNOSIS — R3915 Urgency of urination: Secondary | ICD-10-CM | POA: Diagnosis not present

## 2021-07-31 DIAGNOSIS — N200 Calculus of kidney: Secondary | ICD-10-CM | POA: Diagnosis not present

## 2021-07-31 DIAGNOSIS — I700 Atherosclerosis of aorta: Secondary | ICD-10-CM | POA: Diagnosis not present

## 2021-08-01 ENCOUNTER — Ambulatory Visit (INDEPENDENT_AMBULATORY_CARE_PROVIDER_SITE_OTHER): Payer: Medicare Other

## 2021-08-01 VITALS — Ht 67.5 in | Wt 176.0 lb

## 2021-08-01 DIAGNOSIS — Z0000 Encounter for general adult medical examination without abnormal findings: Secondary | ICD-10-CM

## 2021-08-01 NOTE — Progress Notes (Signed)
Subjective:   Paul Drake is a 75 y.o. male who presents for Medicare Annual/Subsequent preventive examination.  I connected with Calder today by telephone and verified that I am speaking with the correct person using two identifiers. Location patient: home Location provider: work Persons participating in the virtual visit: patient, Marine scientist.    I discussed the limitations, risks, security and privacy concerns of performing an evaluation and management service by telephone and the availability of in person appointments. I also discussed with the patient that there may be a patient responsible charge related to this service. The patient expressed understanding and verbally consented to this telephonic visit.    Interactive audio and video telecommunications were attempted between this provider and patient, however failed, due to patient having technical difficulties OR patient did not have access to video capability.  We continued and completed visit with audio only.  Some vital signs may be absent or patient reported.   Time Spent with patient on telephone encounter: 25 minutes   Review of Systems     Cardiac Risk Factors include: male gender;advanced age (>75men, >66 women);dyslipidemia;hypertension     Objective:    Today's Vitals   08/01/21 1023  Weight: 176 lb (79.8 kg)  Height: 5' 7.5" (1.715 m)  PainSc: 3    Body mass index is 27.16 kg/m.  Advanced Directives 08/01/2021 06/08/2021 05/30/2021 03/30/2021 01/23/2021 08/09/2020 06/17/2020  Does Patient Have a Medical Advance Directive? Yes Yes Yes Yes Yes Yes Yes  Type of Paramedic of [place];Living will [place];Living will [place];Living will - [place];Living will;Out of facility DNR (pink MOST or yellow form) - [place];Living will  Does patient want to make changes to medical advance directive? - No - Patient declined No  - Patient declined - - - -  Copy of [place] in Chart? No - copy requested No - copy requested - - - - -    Current Medications (verified) Outpatient Encounter Medications as of 08/01/2021  Medication Sig   albuterol (VENTOLIN HFA) 108 (90 Base) MCG/ACT inhaler Inhale 2 puffs into the lungs every 6 (six) hours as needed for wheezing or shortness of breath.   carbidopa-levodopa (SINEMET IR) 25-100 MG tablet Take 1 tablet by mouth 3 (three) times daily.   clonazePAM (KLONOPIN) 0.5 MG tablet Take 1 tablet (0.5 mg total) by mouth daily.   finasteride (PROSCAR) 5 MG tablet Take 5 mg by mouth daily.   fluticasone (FLONASE) 50 MCG/ACT nasal spray SHAKE LIQUID AND USE 2 SPRAYS IN EACH NOSTRIL DAILY   gabapentin (NEURONTIN) 100 MG capsule 1 cap po q 4 hours   levocetirizine (XYZAL) 5 MG tablet TAKE 1 TABLET(5 MG) BY MOUTH EVERY EVENING   Menthol, Topical Analgesic, (BIOFREEZE EX) Apply 1 application topically daily as needed (hand pain).   Multiple Vitamins-Minerals (MULTIVITAMINS THER. W/MINERALS) TABS tablet Take 1 tablet by mouth daily.   Omega-3 Fatty Acids (OMEGA-3 PO) Take 350 mg by mouth daily.   pantoprazole (PROTONIX) 40 MG tablet TAKE 1 TABLET(40 MG) BY MOUTH DAILY   simvastatin (ZOCOR) 20 MG tablet TAKE 1 TABLET(20 MG) BY MOUTH DAILY   sulfamethoxazole-trimethoprim (BACTRIM DS) 800-160 MG tablet Take 1 tablet by mouth 2 (two) times daily.   venlafaxine XR (EFFEXOR-XR) 150 MG 24 hr capsule Take 1 capsule (150 mg total) by mouth at bedtime.   No facility-administered encounter medications on file as of 08/01/2021.    Allergies (  verified) Molds & smuts and Pollen extract   History: Past Medical History:  Diagnosis Date   Allergic state 12/29/2013   Arthritis    Barrett's esophagus    BPH (benign prostatic hyperplasia)    Chicken pox    CVA (cerebral infarction)    Depression    GERD (gastroesophageal reflux disease)    History of kidney stones    HTN  (hypertension), benign 04/01/2015   Hyperglycemia 01/06/2015   Hyperlipemia    Hypertension    IBS (irritable bowel syndrome)    Left hand pain 07/21/2017   Measles as a child   Medicare annual wellness visit, subsequent 06/26/2015   Mumps as a child   Otitis externa 12/10/2013   Seasonal allergies    some asthma   Stroke [facility])    Unspecified asthma(493.90) 12/29/2013   Past Surgical History:  Procedure Laterality Date   COLONOSCOPY     EP IMPLANTABLE DEVICE N/A 10/16/2016   Procedure: Loop Recorder Insertion;  Surgeon: Thompson Grayer, MD;  Location: [place] [facility];  Service: Cardiovascular;  Laterality: N/A;   GANGLION CYST EXCISION Left 06/08/2021   Procedure: LEFT VOLAR GANGLION EXCISION;  Surgeon: Sherilyn Cooter, MD;  Location: [place];  Service: Orthopedics;  Laterality: Left;   implantable loop recorder removal  09/21/2020   Reveal LINQ removed in office   LITHOTRIPSY     multiple times   PROSTATE SURGERY  08/2017   TEE WITHOUT CARDIOVERSION N/A 09/22/2014   Procedure: TRANSESOPHAGEAL ECHOCARDIOGRAM (TEE);  Surgeon: Josue Hector, MD;  [facility] ENDOSCOPY;  Service: Cardiovascular;  Laterality: N/A;   WISDOM TOOTH EXTRACTION     Family History  Problem Relation Age of Onset   Hypertension Mother    Hyperlipidemia Mother    Fibromyalgia Mother    Arthritis Mother        rheumatoid   Diabetes Sister        type 2   Hyperlipidemia Brother    Hypertension Brother    Prostate cancer Brother    Ulcers Father 71       Bleeding Ulcers   Kidney Stones Daughter    Asthma Daughter    Healthy Son    Cancer Maternal Grandfather        skin ?   Stroke Maternal Aunt    Cancer Maternal Uncle        prostate   Social History   Socioeconomic History   Marital status: Married    Spouse name: Webb Silversmith   Number of children: 3   Years of education: college   Highest education level: Master's degree (e.g., MA, MS, MEng, MEd, MSW, MBA)  Occupational History    Occupation: retired    Comment: retired  Tobacco Use   Smoking status: Never   Smokeless tobacco: Never  Vaping Use   Vaping Use: Never used  Substance and Sexual Activity   Alcohol use: Yes    Alcohol/week: 1.0 standard drink    Types: 1 Shots of liquor per week    Comment: daily   Drug use: No   Sexual activity: Yes    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 They are living in their daughter's house to help to help take care of their granddaughter.  Patient uses a home gym 3-4 x a week.      Social Determinants of Health   Financial Resource Strain: Low Risk    Difficulty of Paying Living Expenses: Not hard at all  Food Insecurity: No Food Insecurity   Worried About Charity fundraiser in the Last Year: Never true   [place] in the Last Year: Never true  Transportation Needs: No Transportation Needs   Lack of Transportation (Medical): No   Lack of Transportation (Non-Medical): No  Physical Activity: Insufficiently Active   Days of Exercise per Week: 1 day   Minutes of Exercise per Session: 60 min  Stress: No Stress Concern Present   Feeling of Stress : Not at all  Social Connections: Moderately Isolated   Frequency of Communication with Friends and Family: More than three times a week   Frequency of Social Gatherings with Friends and Family: More than three times a week   Attends Religious Services: Never   Marine scientist or Organizations: No   Attends Music therapist: Never   Marital Status: Married    Tobacco Counseling Counseling given: Not Answered   Clinical Intake:  Pre-visit preparation completed: Yes  Pain : 0-10 Pain Score: 3  Pain Type: Acute pain Pain Location: Back (from kidney stones & UTI-sees urologist) Pain Onset: 1 to 4  weeks ago Pain Frequency: Constant     BMI - recorded: 27.16 Nutritional Status: BMI 25 -29 Overweight Nutritional Risks: None Diabetes: No  How often do you need to have someone help you when you read instructions, pamphlets, or other written materials from your doctor or pharmacy?: 1 - Never  Diabetic?No  Interpreter Needed?: No  Information entered by :: Caroleen Hamman LPN   Activities of Daily Living In your present state of health, do you have any difficulty performing the following activities: 08/01/2021 06/19/2021  Hearing? N N  Vision? N N  Difficulty concentrating or making decisions? N N  Walking or climbing stairs? N N  Dressing or bathing? N N  Doing errands, shopping? N N  Preparing Food and eating ? N -  Using the Toilet? N -  In the past six months, have you accidently leaked urine? N -  Do you have problems with loss of bowel control? N -  Managing your Medications? N -  Managing your Finances? N -  Housekeeping or managing your Housekeeping? N -  Some recent data might be hidden    Patient Care Team: Mosie Lukes, MD as PCP - General (Family Medicine) Rosalin Hawking, MD as Consulting Physician (Neurology) Ladene Artist, MD as Consulting Physician (Gastroenterology) Sheryn Bison, MD as Referring Physician (Dermatology) Robley Fries, MD as Consulting Physician (Urology) Tat, Eustace Quail, DO as Consulting Physician (Neurology)  Indicate any recent Medical Services you may have received from other than [facility] providers in the past year (date may be approximate).     Assessment:   This is a routine wellness examination for [place].  Hearing/Vision screen Hearing Screening - Comments:: No issues Vision Screening - Comments:: Last eye exam-2 months ago-[facility]  Dietary issues and exercise activities discussed: Current Exercise Habits: Home exercise routine, Type of exercise: Other - see comments (Golfing), Time (Minutes): 60, Frequency  (Times/Week): 1, Weekly Exercise (Minutes/Week): 60, Intensity: Mild, Exercise limited by: None identified   Goals Addressed             This Visit's Progress    Maintain  healthy lifestyle.   On track    Patient Stated       Increase exercise       Depression Screen PHQ 2/9 Scores 08/01/2021 02/02/2021 06/23/2020 05/19/2020 03/01/2020 05/18/2019 05/16/2018  PHQ - 2 Score 1 2 4 1 4 1  0  PHQ- 9 Score - 3 19 - 12 - -    Fall Risk Fall Risk  08/01/2021 01/23/2021 08/09/2020 05/19/2020 03/01/2020  Falls in the past year? 1 0 0 0 1  Comment - - - - -  Number falls in past yr: 0 0 0 0 1  Comment - - - - -  Injury with Fall? 0 0 0 0 0  Risk Factor Category  - - - - -  Risk for fall due to : History of fall(s) - - - -  Risk for fall due to: Comment - - - - -  Follow up Falls prevention discussed - - Education provided;Falls prevention discussed -    FALL RISK PREVENTION PERTAINING TO THE HOME:  Any stairs in or around the home? Yes  If so, are there any without handrails? No  Home free of loose throw rugs in walkways, pet beds, electrical cords, etc? Yes  Adequate lighting in your home to reduce risk of falls? Yes   ASSISTIVE DEVICES UTILIZED TO PREVENT FALLS:  Life alert? No  Use of a cane, walker or w/c? No  Grab bars in the bathroom? Yes  Shower chair or bench in shower? No  Elevated toilet seat or a handicapped toilet? No   TIMED UP AND GO:  Was the test performed? No . Phone visit   Cognitive Function:Normal cognitive status assessed by this Nurse Health Advisor. No abnormalities found.   MMSE - Mini Mental State Exam 10/15/2016  Orientation to time 5  Orientation to Place 5  Registration 3  Attention/ Calculation 5  Recall 3  Language- name 2 objects 2  Language- repeat 1  Language- follow 3 step command 3  Language- read & follow direction 1  Write a sentence 1  Copy design 1  Total score 30        Immunizations Immunization History  Administered Date(s)  Administered   Fluad Quad(high Dose 65+) 06/17/2019, 06/23/2020   Influenza, High Dose Seasonal PF 06/23/2017, 07/01/2018   Influenza,inj,Quad PF,6+ Mos 05/17/2014, 05/20/2015, 07/05/2016   Influenza-Unspecified 06/17/2013, 06/05/2021   PFIZER Comirnaty(Gray Top)Covid-19 Tri-Sucrose Vaccine 01/24/2021   PFIZER(Purple Top)SARS-COV-2 Vaccination 10/02/2019, 10/23/2019, 05/26/2020   Pfizer Covid-19 Vaccine Bivalent Booster 41yrs & up 06/05/2021   Pneumococcal Conjugate-13 10/15/2016   Pneumococcal-Unspecified 06/17/2013   Tdap 11/11/2013   Zoster Recombinat (Shingrix) 06/19/2021    TDAP status: Up to date  Flu Vaccine status: Up to date  Pneumococcal vaccine status: Up to date  Covid-19 vaccine status: Completed vaccines  Qualifies for Shingles Vaccine? No   Zostavax completed No   Shingrix Completed?: Yes  Screening Tests Health Maintenance  Topic Date Due   Pneumonia Vaccine 23+ Years old (2 - PPSV23 if available, else PCV20) 10/15/2017   Zoster Vaccines- Shingrix (2 of 2) 08/14/2021   TETANUS/TDAP  11/12/2023   INFLUENZA VACCINE  Completed   COVID-19 Vaccine  Completed   Hepatitis C Screening  Completed   HPV VACCINES  Aged Out    Health Maintenance  Health Maintenance Due  Topic Date Due   Pneumonia Vaccine 52+ Years old (2 - PPSV23 if available, else PCV20) 10/15/2017    Colorectal cancer screening: No longer required.  Lung Cancer Screening: (Low Dose CT Chest recommended if Age 44-80 years, 30 pack-year currently smoking OR have quit w/in 15years.) does not qualify.     Additional Screening:  Hepatitis C Screening: Completed 10/20/2015  Vision Screening: Recommended annual ophthalmology exams for early detection of glaucoma and other disorders of the eye. Is the patient up to date with their annual eye exam?  Yes  Who is the provider or what is the name of the office in which the patient attends annual eye exams? [place] Screening:  Recommended annual dental exams for proper oral hygiene  Community Resource Referral / Chronic Care Management: CRR required this visit?  No   CCM required this visit?  No      Plan:     I have personally reviewed and noted the following in the patient's chart:   Medical and social history Use of alcohol, tobacco or illicit drugs  Current medications and supplements including opioid prescriptions. Patient is not currently taking opioid prescriptions. Functional ability and status Nutritional status Physical activity Advanced directives List of other physicians Hospitalizations, surgeries, and ER visits in previous 12 months Vitals Screenings to include cognitive, depression, and falls Referrals and appointments  In addition, I have reviewed and discussed with patient certain preventive protocols, quality metrics, and best practice recommendations. A written personalized care plan for preventive services as well as general preventive health recommendations were provided to patient.   Due to this being a telephonic visit, the after visit summary with patients personalized plan was offered to patient via mail or my-chart. Patient would like to access on my-chart.   Marta Antu, LPN   [id number]  Nurse Health Advisor  Nurse Notes: None

## 2021-08-01 NOTE — Patient Instructions (Signed)
Paul Drake , Thank you for taking time to complete your Medicare Wellness Visit. I appreciate your ongoing commitment to your health goals. Please review the following plan we discussed and let me know if I can assist you in the future.   Screening recommendations/referrals: Colonoscopy: No longer required Recommended yearly ophthalmology/optometry visit for glaucoma screening and checkup Recommended yearly dental visit for hygiene and checkup  Vaccinations: Influenza vaccine: Up to date Pneumococcal vaccine: Up to date Tdap vaccine: Up to date Shingles vaccine: Completed vaccines   Covid-19: Up to date  Advanced directives: Please bring a copy of Living Will and/or Healthcare Power of Attorney for your chart.   Conditions/risks identified: See problem list  Next appointment: Follow up in one year for your annual wellness visit. 08/03/2022 @ 10:20.  Preventive Care 5 Years and Older, Male Preventive care refers to lifestyle choices and visits with your health care provider that can promote health and wellness. What does preventive care include? A yearly physical exam. This is also called an annual well check. Dental exams once or twice a year. Routine eye exams. Ask your health care provider how often you should have your eyes checked. Personal lifestyle choices, including: Daily care of your teeth and gums. Regular physical activity. Eating a healthy diet. Avoiding tobacco and drug use. Limiting alcohol use. Practicing safe sex. Taking low doses of aspirin every day. Taking vitamin and mineral supplements as recommended by your health care provider. What happens during an annual well check? The services and screenings done by your health care provider during your annual well check will depend on your age, overall health, lifestyle risk factors, and family history of disease. Counseling  Your health care provider may ask you questions about your: Alcohol use. Tobacco  use. Drug use. Emotional well-being. Home and relationship well-being. Sexual activity. Eating habits. History of falls. Memory and ability to understand (cognition). Work and work Statistician. Screening  You may have the following tests or measurements: Height, weight, and BMI. Blood pressure. Lipid and cholesterol levels. These may be checked every 5 years, or more frequently if you are over 31 years old. Skin check. Lung cancer screening. You may have this screening every year starting at age 60 if you have a 30-pack-year history of smoking and currently smoke or have quit within the past 15 years. Fecal occult blood test (FOBT) of the stool. You may have this test every year starting at age 75. Flexible sigmoidoscopy or colonoscopy. You may have a sigmoidoscopy every 5 years or a colonoscopy every 10 years starting at age 81. Prostate cancer screening. Recommendations will vary depending on your family history and other risks. Hepatitis C blood test. Hepatitis B blood test. Sexually transmitted disease (STD) testing. Diabetes screening. This is done by checking your blood sugar (glucose) after you have not eaten for a while (fasting). You may have this done every 1-3 years. Abdominal aortic aneurysm (AAA) screening. You may need this if you are a current or former smoker. Osteoporosis. You may be screened starting at age 48 if you are at high risk. Talk with your health care provider about your test results, treatment options, and if necessary, the need for more tests. Vaccines  Your health care provider may recommend certain vaccines, such as: Influenza vaccine. This is recommended every year. Tetanus, diphtheria, and acellular pertussis (Tdap, Td) vaccine. You may need a Td booster every 10 years. Zoster vaccine. You may need this after age 11. Pneumococcal 13-valent conjugate (PCV13) vaccine. One  dose is recommended after age 68. Pneumococcal polysaccharide (PPSV23) vaccine.  One dose is recommended after age 62. Talk to your health care provider about which screenings and vaccines you need and how often you need them. This information is not intended to replace advice given to you by your health care provider. Make sure you discuss any questions you have with your health care provider. Document Released: 09/30/2015 Document Revised: 05/23/2016 Document Reviewed: 07/05/2015 Elsevier Interactive Patient Education  2017 [place] Prevention in the Home Falls can cause injuries. They can happen to people of all ages. There are many things you can do to make your home safe and to help prevent falls. What can I do on the outside of my home? Regularly fix the edges of walkways and driveways and fix any cracks. Remove anything that might make you trip as you walk through a door, such as a raised step or threshold. Trim any bushes or trees on the path to your home. Use bright outdoor lighting. Clear any walking paths of anything that might make someone trip, such as rocks or tools. Regularly check to see if handrails are loose or broken. Make sure that both sides of any steps have handrails. Any raised decks and porches should have guardrails on the edges. Have any leaves, snow, or ice cleared regularly. Use sand or salt on walking paths during winter. Clean up any spills in your garage right away. This includes oil or grease spills. What can I do in the bathroom? Use night lights. Install grab bars by the toilet and in the tub and shower. Do not use towel bars as grab bars. Use non-skid mats or decals in the tub or shower. If you need to sit down in the shower, use a plastic, non-slip stool. Keep the floor dry. Clean up any water that spills on the floor as soon as it happens. Remove soap buildup in the tub or shower regularly. Attach bath mats securely with double-sided non-slip rug tape. Do not have throw rugs and other things on the floor that can make  you trip. What can I do in the bedroom? Use night lights. Make sure that you have a light by your bed that is easy to reach. Do not use any sheets or blankets that are too big for your bed. They should not hang down onto the floor. Have a firm chair that has side arms. You can use this for support while you get dressed. Do not have throw rugs and other things on the floor that can make you trip. What can I do in the kitchen? Clean up any spills right away. Avoid walking on wet floors. Keep items that you use a lot in easy-to-reach places. If you need to reach something above you, use a strong step stool that has a grab bar. Keep electrical cords out of the way. Do not use floor polish or wax that makes floors slippery. If you must use wax, use non-skid floor wax. Do not have throw rugs and other things on the floor that can make you trip. What can I do with my stairs? Do not leave any items on the stairs. Make sure that there are handrails on both sides of the stairs and use them. Fix handrails that are broken or loose. Make sure that handrails are as long as the stairways. Check any carpeting to make sure that it is firmly attached to the stairs. Fix any carpet that is loose or worn.  Avoid having throw rugs at the top or bottom of the stairs. If you do have throw rugs, attach them to the floor with carpet tape. Make sure that you have a light switch at the top of the stairs and the bottom of the stairs. If you do not have them, ask someone to add them for you. What else can I do to help prevent falls? Wear shoes that: Do not have high heels. Have rubber bottoms. Are comfortable and fit you well. Are closed at the toe. Do not wear sandals. If you use a stepladder: Make sure that it is fully opened. Do not climb a closed stepladder. Make sure that both sides of the stepladder are locked into place. Ask someone to hold it for you, if possible. Clearly mark and make sure that you can  see: Any grab bars or handrails. First and last steps. Where the edge of each step is. Use tools that help you move around (mobility aids) if they are needed. These include: Canes. Walkers. Scooters. Crutches. Turn on the lights when you go into a dark area. Replace any light bulbs as soon as they burn out. Set up your furniture so you have a clear path. Avoid moving your furniture around. If any of your floors are uneven, fix them. If there are any pets around you, be aware of where they are. Review your medicines with your doctor. Some medicines can make you feel dizzy. This can increase your chance of falling. Ask your doctor what other things that you can do to help prevent falls. This information is not intended to replace advice given to you by your health care provider. Make sure you discuss any questions you have with your health care provider. Document Released: 06/30/2009 Document Revised: 02/09/2016 Document Reviewed: 10/08/2014 Elsevier Interactive Patient Education  2017 [employer].

## 2021-08-04 NOTE — Progress Notes (Signed)
Assessment/Plan:   1.  Parkinsons Disease  -take carbidopa/levodopa 25/100 tid (he used to be on qid but he backed down)  -exercise  -We discussed that it used to be thought that levodopa would increase risk of melanoma but now it is believed that Parkinsons itself likely increases risk of melanoma. he is to get regular skin checks.  He is doing that.  -Discussed importance of regular follow-ups with the patient.   2.  RBD  -Continue clonazepam 0.5 mg at bedtime   Subjective:   Paul Drake was seen today in follow up for Parkinsons disease.  My previous records were reviewed prior to todays visit as well as outside records available to me.  Was in the emergency room on July 14 after a fall.  He was cleaning out his car and leaning on the vehicle with the weight on the left leg.  When he tried to back out of the vehicle he ended up staggering and falling on his butt.  ER work-up was negative and he was sent home with pain control.  Pt denies lightheadedness, near syncope.  No hallucinations.  Mood has been good.  On clonazepam for RBD.  He had been off of it for a short time, primarily because he had no follow-up appointment here and the medication was declined because of that since it is a controlled substance.  He did not call to make the appointment at our office, and subsequently went off of the medication.  He ended up throwing himself out of the bed during one of the dreams.  He called here and wanted to know what he could do.  We saw him that he should be on the medication, but he also needed a follow-up appointment.  He made a follow-up appointment, and we placed him back on that medicine.  Pt states that "I am no longer violent and no longer throwing myself out of the bed.  I can't believe it helps that well."  Had a left volar ganglion cyst removed on September 22.  "That really helped my golf game."  Those notes are reviewed.  Current prescribed movement disorder  medications: Carbidopa/levodopa 25/100, 1 tablet 3 times per day  Clonazepam 0.5 mg at bedtime   ALLERGIES:   Allergies  Allergen Reactions   Molds & Smuts Other (See Comments)   Pollen Extract Other (See Comments)    Runny nose,itchy eyes (trees, grasses, etc)     CURRENT MEDICATIONS:  Outpatient Encounter Medications as of 08/08/2021  Medication Sig   albuterol (VENTOLIN HFA) 108 (90 Base) MCG/ACT inhaler Inhale 2 puffs into the lungs every 6 (six) hours as needed for wheezing or shortness of breath.   carbidopa-levodopa (SINEMET IR) 25-100 MG tablet Take 1 tablet by mouth 3 (three) times daily.   clonazePAM (KLONOPIN) 0.5 MG tablet Take 1 tablet (0.5 mg total) by mouth daily.   finasteride (PROSCAR) 5 MG tablet Take 5 mg by mouth daily.   fluticasone (FLONASE) 50 MCG/ACT nasal spray SHAKE LIQUID AND USE 2 SPRAYS IN EACH NOSTRIL DAILY   gabapentin (NEURONTIN) 100 MG capsule 1 cap po q 4 hours   pantoprazole (PROTONIX) 40 MG tablet TAKE 1 TABLET(40 MG) BY MOUTH DAILY   simvastatin (ZOCOR) 20 MG tablet TAKE 1 TABLET(20 MG) BY MOUTH DAILY   levocetirizine (XYZAL) 5 MG tablet TAKE 1 TABLET(5 MG) BY MOUTH EVERY EVENING   Menthol, Topical Analgesic, (BIOFREEZE EX) Apply 1 application topically daily as needed (hand pain).  Multiple Vitamins-Minerals (MULTIVITAMINS THER. W/MINERALS) TABS tablet Take 1 tablet by mouth daily.   Omega-3 Fatty Acids (OMEGA-3 PO) Take 350 mg by mouth daily.   sulfamethoxazole-trimethoprim (BACTRIM DS) 800-160 MG tablet Take 1 tablet by mouth 2 (two) times daily.   venlafaxine XR (EFFEXOR-XR) 150 MG 24 hr capsule Take 1 capsule (150 mg total) by mouth at bedtime.   No facility-administered encounter medications on file as of 08/08/2021.    Objective:   PHYSICAL EXAMINATION:    VITALS:   Vitals:   08/08/21 0842  BP: 132/72  Pulse: 73  SpO2: 99%  Weight: 175 lb 6.4 oz (79.6 kg)  Height: 5\' 8"  (1.727 m)     GEN:  The patient appears stated age and  is in NAD. HEENT:  Normocephalic, atraumatic.  The mucous membranes are moist. The superficial temporal arteries are without ropiness or tenderness. CV:  RRR Lungs:  CTAB Neck/HEME:  There are no carotid bruits bilaterally.  Neurological examination:  Orientation: The patient is alert and oriented x3. Cranial nerves: There is good facial symmetry with minimal facial hypomimia. The speech is fluent and clear. Soft palate rises symmetrically and there is no tongue deviation. Hearing is intact to conversational tone. Sensation: Sensation is intact to light touch throughout Motor: Strength is at least antigravity x4.  Movement examination: Tone: There is mild increased tone in the RUE (same as previous) Abnormal movements: RUE tremor only with ambulation (same as previous) Coordination:  There is no decremation with RAM's, with any form of RAMS, including alternating supination and pronation of the forearm, hand opening and closing, finger taps, heel taps and toe taps. Gait and Station: The patient has no difficulty arising out of a deep-seated chair without the use of the hands. The patient's stride length is good with tremor on the RUE.    I have reviewed and interpreted the following labs independently    Chemistry      Component Value Date/Time   NA 139 07/07/2021 1152   K 4.4 07/07/2021 1152   CL 102 07/07/2021 1152   CO2 31 07/07/2021 1152   BUN 21 07/07/2021 1152   CREATININE 1.23 07/07/2021 1152   CREATININE 1.38 (H) 07/04/2020 1004      Component Value Date/Time   CALCIUM 9.5 07/07/2021 1152   ALKPHOS 79 07/07/2021 1152   AST 15 07/07/2021 1152   ALT 7 07/07/2021 1152   BILITOT 0.6 07/07/2021 1152       Lab Results  Component Value Date   WBC 5.5 07/07/2021   HGB 13.0 07/07/2021   HCT 38.8 (L) 07/07/2021   MCV 97.2 07/07/2021   PLT 179.0 07/07/2021    Lab Results  Component Value Date   TSH 1.54 06/05/2021      Cc:  Mosie Lukes, MD

## 2021-08-08 ENCOUNTER — Other Ambulatory Visit: Payer: Self-pay | Admitting: Family Medicine

## 2021-08-08 ENCOUNTER — Ambulatory Visit (INDEPENDENT_AMBULATORY_CARE_PROVIDER_SITE_OTHER): Payer: Medicare Other | Admitting: Neurology

## 2021-08-08 ENCOUNTER — Other Ambulatory Visit: Payer: Self-pay

## 2021-08-08 VITALS — BP 132/72 | HR 73 | Ht 68.0 in | Wt 175.4 lb

## 2021-08-08 DIAGNOSIS — G20 Parkinson's disease: Secondary | ICD-10-CM | POA: Diagnosis not present

## 2021-08-08 DIAGNOSIS — I63411 Cerebral infarction due to embolism of right middle cerebral artery: Secondary | ICD-10-CM | POA: Diagnosis not present

## 2021-08-08 NOTE — Patient Instructions (Signed)
Online Resources for Power over Parkinson's Group November 2022  Local [place] Online Groups  Power over [employer] Group :   Power Over Parkinson's Patient Education Group will be Wednesday, November 9th-*Hybrid meting*- in person at [place] location and via [facility] at 2:00 pm.   Upcoming Power over Parkinson's Meetings:  2nd Wednesdays of the month at 2 pm:  November 9th, December 14th [place] at [email or web address] if interested in participating in this online group Parkinson's Care Partners Group:    3rd Mondays, Contact Misty Paladino Atypical Parkinsonian Patient Group:   4th Wednesdays, [place] If you are interested in participating in these online groups with Misty, please contact her directly for how to join those meetings.  Her contact information is [email or web address].   [place]:  [email or web address] PD Health at Home continues:  Mindfulness Mondays, Expert Briefing Tuesdays, Wellness Wednesdays, Take Time Thursdays, Fitness Fridays -Listings for June 2022 are on the website Upcoming Webinar:  Expert Briefing:  Let's Talk about Dementia.  Wednesday, November 2nd  at 1 pm. Register for Armed forces operational officer) at [email or web address]  Please check out their website to sign up for emails and see their full online offerings  [place]:  [email or web address]  Upcoming Webinar:   2022 in Review:  Progress Toward Better Treatment and Prevention.  Thursday, November 17th at 12 noon Check out additional information on their website to see their full online offerings  [place]:  [email or web address] Upcoming Webinar:  NOH (Neurogenic Orthostatic Hypotension):  What it is, How to Know if you Have it, and How to Manage it.  Tuesday, November 15th at 3 pm.  Webinar Series:  Living with Parkinson's Meetup.    Third Thursdays of each month at 3 pm; next one is November 17th Care Partner Monthly Meetup.  With Robin Searing Phinney.  First Tuesday of each month, 2 pm Joy Breaks:  First Wednesday of each month, 2-3 pm. There will be art, doodling, making, crafting, listening, laughing, stories, and everything in between. No art experience necessary. No supplies required. Just show up for joy!  Register on their website. Check out additional information to Live Well Today on their website  Parkinson and Movement Disorders (PMD) Alliance:  www.pmdalliance.org NeuroLife Online:  Online Education Events Sign up for emails, which are sent weekly to give you updates on programming and online offerings  Parkinson's Association of the [place]:  www.[email or web address] Information on online support groups, education events, and online exercises including Yoga, Parkinson's exercises and more-LOTS of information on links to PD resources and online events Virtual Support Group through Parkinson's Association of the [place]; next one is scheduled for Wednesday, November 2nd  at 2 pm.  (These are typically scheduled for the 1st Wednesday of the month at 2 pm).  Visit website for details.  Additional links for movement activities: Parkinson's DRUMMING Classes/Music Therapy with Doylene Canning:  This is a returning class and it's FREE!  2nd Mondays, continuing November 14th.  Contact *Misty Taylor-Paladino at [email or web address] or Doylene Canning at [phone] or [email or web address]  PWR! Moves Classes at [place].  Wednesdays 10 and 11 am.  Contact Amy Marriott, PT [email or web address] if interested. Here is a link to the PWR!Moves classes on Zoom from [place] - Daily Mon-Sat at 10:00. Via Zoom, FREE and open to all.  There is also a link below via Facebook if you use that  platform. [email or web address].si [email or web address] Parkinson's Wellness Recovery (PWR! Moves)  www.[email or web address] Info on  the PWR! Virtual Experience:  You will have access to our expertise through self-assessment, guided plans that start with the PD-specific fundamentals, educational content, tips, Q&A with an expert, and a growing Art therapist of PD-specific pre-recorded and live exercise classes of varying types and intensity - both physical and cognitive! If that is not enough, we offer 1:1 wellness consultations (in-person or virtual) to personalize your PWR! Research scientist (medical).  [place] Fridays:  As part of the PD Health @ Home program, this free video series focuses each week on one aspect of fitness designed to support people living with Parkinson's.  These weekly videos highlight the [place] recent fitness guidelines for people with Parkinson's disease.  HollywoodSale.dk Dance for PD website is offering free, live-stream classes throughout the week, as well as links to [employer] of classes:  [email or web address].[email or web address]/ Dance for Parkinson's Class:  [place].  Free offering for people with Parkinson's and care partners; virtual class.  For more information, contact [phone] or email Ruffin Frederick at [email or web address] Virtual dance and Pilates for Parkinson's classes: Click on the Community Tab> Parkinson's Movement Initiative Tab.  To register for classes and for more information, visit www.[email or web address].co.[email or web address] and click the "community" tab.  [employer] Parkinson's Cycling Classes  [employer]: 1pm on Fridays-Live classes at [employer] ([employer] at  [email or web address] or [phone]) [employer]: Virtual Classes Mondays and Thursdays Jeanette Caprice classes Tuesday, Wednesday and Thursday (contact [place] at [email or web address]  or [phone]) [place] Varied levels of classes are offered Mondays, Tuesdays and Thursdays at [employer].  To observe a class or for more information, call [phone] or email [email or web address] Well-Spring [employer]: Online Caregiver Education Opportunities:  www.well-springsolutions.org/caregiver-education/caregiver-support-group.  You may also contact Vickki Muff at [email or web address] or [phone].   *Multiple opportunities below, as November is Caregiver Month!* A Guide to Physical and Mental Fitness for Family Caregivers.  Wednesday, November 9th, 12:30-2 pm at [facility]!  Tuesday, November 15th, 11:30-12:45.  [facility], [place] 03-23-1996 Understanding Care Options and Advanced Care Planning.  Monday, November 21st, 4:30-5:45.  [place], [place] above to register. Well-Spring Navigator:  [address] program, a free service to help individuals and families through the journey of determining care for older adults.  The "Navigator" is a [employer], Education officer, museum, who will speak with a prospective client and/or loved ones to provide an assessment of the situation and a set of recommendations for a personalized care plan -- all free of charge, and whether Well-Spring Solutions offers the needed service or not. If the need is not a service we provide, we are well-connected with reputable programs in town that we can refer you to.  www.well-springsolutions.org or to speak with the Navigator, call [phone].

## 2021-08-18 ENCOUNTER — Other Ambulatory Visit: Payer: Self-pay | Admitting: Neurology

## 2021-08-18 DIAGNOSIS — N401 Enlarged prostate with lower urinary tract symptoms: Secondary | ICD-10-CM | POA: Diagnosis not present

## 2021-08-18 DIAGNOSIS — R350 Frequency of micturition: Secondary | ICD-10-CM | POA: Diagnosis not present

## 2021-08-18 MED ORDER — CLONAZEPAM 0.5 MG PO TABS: 0.5000 mg | ORAL_TABLET | Freq: Every day | ORAL | 5 refills | Status: DC

## 2021-08-18 NOTE — Telephone Encounter (Signed)
Patient has been without the medication Klonopin .5 MG.  [address] [facility]

## 2021-08-18 NOTE — Telephone Encounter (Signed)
Sent to Dr Tat to approve

## 2021-08-18 NOTE — Telephone Encounter (Signed)
Patient has been without the medication for two days, reportedly.

## 2021-08-26 DIAGNOSIS — Z20822 Contact with and (suspected) exposure to covid-19: Secondary | ICD-10-CM | POA: Diagnosis not present

## 2021-08-31 ENCOUNTER — Telehealth: Payer: Self-pay

## 2021-08-31 ENCOUNTER — Telehealth: Payer: Self-pay | Admitting: Family Medicine

## 2021-08-31 ENCOUNTER — Other Ambulatory Visit: Payer: Self-pay | Admitting: Family Medicine

## 2021-08-31 MED ORDER — MOLNUPIRAVIR EUA 200MG CAPSULE: 4.0000 | ORAL_CAPSULE | Freq: Two times a day (BID) | ORAL | 0 refills | Status: AC

## 2021-08-31 NOTE — Telephone Encounter (Signed)
Caller would like the office to call him.  Telephone: [phone]

## 2021-08-31 NOTE — Telephone Encounter (Signed)
Pt was requesting antiviral because he tested covid + 2 hours ago and has a fever. Please advise.   [id number] - [place], [place] - [address] AT [facility] [address]  [address], [place] [address]  Phone:  [phone]  Fax:  [phone]

## 2021-09-01 NOTE — Telephone Encounter (Signed)
Medication sent in. 

## 2021-09-01 NOTE — Telephone Encounter (Signed)
urse Assessment Nurse: Velta Addison, RN, [place] Date/Time (Eastern Time): 08/31/2021 5:40:12 PM Confirm and document reason for call. If symptomatic, describe symptoms. ---Caller states, called office at lunchtime. Has COVID. Would like to start mediation. Has has two stroke and Parkinson's. Has not heard from office. Has not heard from pharmacy. Has headache, body aches, raspy throat, aches/pains, throat swollen, fever, runny nose, cough. Started symptoms two days ago and progressed today. Does the patient have any new or worsening symptoms? ---Yes Will a triage be completed? ---Yes Related visit to physician within the last 2 weeks? ---No Does the PT have any chronic conditions? (i.e. diabetes, asthma, this includes High risk factors for pregnancy, etc.) ---Yes List chronic conditions. ---2 Strokes, Parkinsons Denies kidney disease, failure Allergies: None Is this a behavioral health or substance abuse call? ---No Guidelines Guideline Title Affirmed Question Affirmed Notes Nurse Date/Time ([place] Time) COVID-19 - Diagnosed or Suspected [1] HIGH RISK for severe COVID complications (e.g., Parrott, RN, [place] 08/31/2021 5:44:04 PM PLEASE NOTE: All timestamps contained within this report are represented as [place] Standard Time. CONFIDENTIALTY NOTICE: This fax transmission is intended only for the addressee. It contains information that is legally privileged, confidential or otherwise protected from use or disclosure. If you are not the intended recipient, you are strictly prohibited from reviewing, disclosing, copying using or disseminating any of this information or taking any action in reliance on or regarding this information. If you have received this fax in error, please notify us immediately by telephone so that we can arrange for its return to [place]. Phone: [phone], Toll-Free: [phone], Fax: [phone] Page: 2 of 2 Call Id: [id number] Guidelines Guideline Title  Affirmed Question Affirmed Notes Nurse Date/Time Eilene Ghazi Time) weak immune system, age > 49 years, obesity with BMI 30 or higher, pregnant, chronic lung disease or other chronic medical condition) AND [2] COVID symptoms (e.g., cough, fever) (Exceptions: Already seen by PCP and no new or worsening symptoms.) Disp. Time Eilene Ghazi Time) Disposition Final User 08/31/2021 5:47:53 PM Call PCP within 24 Hours Yes Parrott, RN, Interior and spatial designer Understands Yes PreDisposition Call Doctor Care Advice Given Per Guideline CALL PCP WITHIN 24 HOURS: * You need to discuss this with your doctor (or NP/PA) within the next 24 hours. * IF OFFICE WILL BE OPEN: Call the office when it opens tomorrow morning. CARE ADVICE given per COVID-19 - DIAGNOSED OR SUSPECTED (Adult) guideline. * You become worse CALL BACK IF: Comments User: Hamilton Capri, RN Date/Time (Eastern Time): 08/31/2021 5:50:12 PM Caller states he does not want to take anything if he does not need to but he was expecting someone to call him notifying if he is a candidate and if it would be beneficial. States his symptoms are mild at this time but he does not want them to get worse. Advised to give office time to review and if he had not had response to call tomorrow.

## 2021-09-13 ENCOUNTER — Other Ambulatory Visit (HOSPITAL_BASED_OUTPATIENT_CLINIC_OR_DEPARTMENT_OTHER): Payer: Self-pay

## 2021-09-13 MED ORDER — SHINGRIX 50 MCG/0.5ML IM SUSR
INTRAMUSCULAR | 0 refills | Status: DC
  Filled 2021-09-13: qty 1, 1d supply, fill #0

## 2021-09-26 DIAGNOSIS — Z20822 Contact with and (suspected) exposure to covid-19: Secondary | ICD-10-CM | POA: Diagnosis not present

## 2021-09-28 ENCOUNTER — Encounter: Payer: Self-pay | Admitting: Gastroenterology

## 2021-10-10 ENCOUNTER — Other Ambulatory Visit: Payer: Self-pay | Admitting: Family Medicine

## 2021-10-23 ENCOUNTER — Other Ambulatory Visit: Payer: Self-pay | Admitting: Family Medicine

## 2021-10-23 ENCOUNTER — Other Ambulatory Visit (INDEPENDENT_AMBULATORY_CARE_PROVIDER_SITE_OTHER): Payer: Medicare Other

## 2021-10-23 DIAGNOSIS — I10 Essential (primary) hypertension: Secondary | ICD-10-CM | POA: Diagnosis not present

## 2021-10-23 DIAGNOSIS — R739 Hyperglycemia, unspecified: Secondary | ICD-10-CM

## 2021-10-23 DIAGNOSIS — R7989 Other specified abnormal findings of blood chemistry: Secondary | ICD-10-CM

## 2021-10-23 DIAGNOSIS — D539 Nutritional anemia, unspecified: Secondary | ICD-10-CM

## 2021-10-23 DIAGNOSIS — E785 Hyperlipidemia, unspecified: Secondary | ICD-10-CM | POA: Diagnosis not present

## 2021-10-23 LAB — LIPID PANEL
Cholesterol: 138 mg/dL (ref 0–200)
HDL: 54 mg/dL (ref >39.00)
LDL Cholesterol: 69 mg/dL (ref 0–99)
NonHDL: 83.95
Total CHOL/HDL Ratio: 3
Triglycerides: 73 mg/dL (ref 0.0–149.0)
VLDL: 14.6 mg/dL (ref 0.0–40.0)

## 2021-10-23 LAB — COMPREHENSIVE METABOLIC PANEL
ALT: 7 U/L (ref 0–53)
AST: 14 U/L (ref 0–37)
Albumin: 4.3 g/dL (ref 3.5–5.2)
Alkaline Phosphatase: 63 U/L (ref 39–117)
BUN: 21 mg/dL (ref 6–23)
CO2: 31 mEq/L (ref 19–32)
Calcium: 9.3 mg/dL (ref 8.4–10.5)
Chloride: 104 mEq/L (ref 96–112)
Creatinine, Ser: 1.26 mg/dL (ref 0.40–1.50)
GFR: 55.06 mL/min — ABNORMAL LOW (ref >60.00)
Glucose, Bld: 100 mg/dL — ABNORMAL HIGH (ref 70–99)
Potassium: 4.6 mEq/L (ref 3.5–5.1)
Sodium: 141 mEq/L (ref 135–145)
Total Bilirubin: 0.6 mg/dL (ref 0.2–1.2)
Total Protein: 6.6 g/dL (ref 6.0–8.3)

## 2021-10-23 LAB — HEMOGLOBIN A1C: Hgb A1c MFr Bld: 5.5 % (ref 4.6–6.5)

## 2021-10-23 LAB — CBC
HCT: 39.9 % (ref 39.0–52.0)
Hemoglobin: 13.2 g/dL (ref 13.0–17.0)
MCHC: 32.9 g/dL (ref 30.0–36.0)
MCV: 97.3 fl (ref 78.0–100.0)
Platelets: 171 10*3/uL (ref 150.0–400.0)
RBC: 4.1 Mil/uL — ABNORMAL LOW (ref 4.22–5.81)
RDW: 13 % (ref 11.5–15.5)
WBC: 6.6 10*3/uL (ref 4.0–10.5)

## 2021-10-23 LAB — TESTOSTERONE: Testosterone: 988.4 ng/dL — ABNORMAL HIGH (ref 300.00–890.00)

## 2021-10-23 LAB — VITAMIN B12: Vitamin B-12: 526 pg/mL (ref 211–911)

## 2021-10-23 LAB — TSH: TSH: 2.25 u[IU]/mL (ref 0.35–5.50)

## 2021-10-24 ENCOUNTER — Other Ambulatory Visit: Payer: Medicare Other

## 2021-11-08 ENCOUNTER — Encounter: Payer: Self-pay | Admitting: Gastroenterology

## 2021-11-08 ENCOUNTER — Ambulatory Visit (INDEPENDENT_AMBULATORY_CARE_PROVIDER_SITE_OTHER): Payer: Medicare Other | Admitting: Gastroenterology

## 2021-11-08 VITALS — BP 122/78 | HR 96 | Ht 67.0 in | Wt 181.0 lb

## 2021-11-08 DIAGNOSIS — K2270 Barrett's esophagus without dysplasia: Secondary | ICD-10-CM

## 2021-11-08 DIAGNOSIS — K5900 Constipation, unspecified: Secondary | ICD-10-CM | POA: Diagnosis not present

## 2021-11-08 NOTE — Patient Instructions (Addendum)
You cane start over the counter Metamucil daily. If not effective then start Miralax daily.   You have been scheduled for an endoscopy. Please follow written instructions given to you at your visit today. If you use inhalers (even only as needed), please bring them with you on the day of your procedure.  The [facility] providers would like to encourage you to use [facility] to communicate with providers for non-urgent requests or questions.  Due to long hold times on the telephone, sending your provider a message by [facility] may be a faster and more efficient way to get a response.  Please allow 48 business hours for a response.  Please remember that this is for non-urgent requests.   Due to recent changes in healthcare laws, you may see the results of your imaging and laboratory studies on MyChart before your provider has had a chance to review them.  We understand that in some cases there may be results that are confusing or concerning to you. Not all laboratory results come back in the same time frame and the provider may be waiting for multiple results in order to interpret others.  Please give [place] 48 hours in order for your provider to thoroughly review all the results before contacting the office for clarification of your results.   Thank you for choosing me and [facility].  Pricilla Riffle. Dagoberto Ligas., MD., Marval Regal

## 2021-11-08 NOTE — Progress Notes (Signed)
History of Present Illness: This is a 76 year old male referred by Mosie Lukes, MD for the evaluation of GERD, Barrett's. EGD with biopsies in November 2015 showed short segment Barrett's without dysplasia. EGD in September 2019 showed findings c/s short segment Barrett's endoscopically however biopsies did not show Barrett's.  His reflux symptoms are well controlled on pantoprazole.  He has occasional mild breakthrough symptoms and takes famotidine in the evening.  He relates ongoing problems with constipation, straining with bowel movements, feeling of incomplete evacuation, gas and abdominal bloating.  He takes Metamucil infrequently. Denies weight loss, abdominal pain, diarrhea, change in stool caliber, melena, hematochezia, nausea, vomiting, dysphagia, reflux symptoms, chest pain.      Allergies  Allergen Reactions   Molds & Smuts Other (See Comments)   Pollen Extract Other (See Comments)    Runny nose,itchy eyes (trees, grasses, etc)    Outpatient Medications Prior to Visit  Medication Sig Dispense Refill   albuterol (VENTOLIN HFA) 108 (90 Base) MCG/ACT inhaler Inhale 2 puffs into the lungs every 6 (six) hours as needed for wheezing or shortness of breath. 1 each 3   carbidopa-levodopa (SINEMET IR) 25-100 MG tablet Take 1 tablet by mouth 3 (three) times daily. 270 tablet 0   clonazePAM (KLONOPIN) 0.5 MG tablet Take 1 tablet (0.5 mg total) by mouth daily. 30 tablet 5   finasteride (PROSCAR) 5 MG tablet Take 5 mg by mouth daily.     fluticasone (FLONASE) 50 MCG/ACT nasal spray SHAKE LIQUID AND USE 2 SPRAYS IN EACH NOSTRIL DAILY 16 g 5   gabapentin (NEURONTIN) 100 MG capsule TAKE 1 CAPSULE BY MOUTH EVERY 4 HOURS 540 capsule 1   levocetirizine (XYZAL) 5 MG tablet TAKE 1 TABLET(5 MG) BY MOUTH EVERY EVENING 30 tablet 2   Menthol, Topical Analgesic, (BIOFREEZE EX) Apply 1 application topically daily as needed (hand pain).     Multiple Vitamins-Minerals (MULTIVITAMINS THER. W/MINERALS)  TABS tablet Take 1 tablet by mouth daily.     Omega-3 Fatty Acids (OMEGA-3 PO) Take 350 mg by mouth daily.     pantoprazole (PROTONIX) 40 MG tablet TAKE 1 TABLET(40 MG) BY MOUTH DAILY 90 tablet 3   simvastatin (ZOCOR) 20 MG tablet TAKE 1 TABLET(20 MG) BY MOUTH DAILY 90 tablet 1   venlafaxine XR (EFFEXOR-XR) 150 MG 24 hr capsule TAKE ONE CAPSULE BY MOUTH DAILY AT BEDTIME 90 capsule 0   Zoster Vaccine Adjuvanted [facility] injection Inject into the muscle. 1 each 0   sulfamethoxazole-trimethoprim (BACTRIM DS) 800-160 MG tablet Take 1 tablet by mouth 2 (two) times daily.     No facility-administered medications prior to visit.   Past Medical History:  Diagnosis Date   Allergic state 12/29/2013   Arthritis    Barrett's esophagus    BPH (benign prostatic hyperplasia)    Chicken pox    CVA (cerebral infarction)    Depression    GERD (gastroesophageal reflux disease)    History of kidney stones    HTN (hypertension), benign 04/01/2015   Hyperglycemia 01/06/2015   Hyperlipemia    Hypertension    IBS (irritable bowel syndrome)    Left hand pain 07/21/2017   Measles as a child   Medicare annual wellness visit, subsequent 06/26/2015   Mumps as a child   Otitis externa 12/10/2013   Seasonal allergies    some asthma   Stroke [facility])    Unspecified asthma(493.90) 12/29/2013   Past Surgical History:  Procedure Laterality Date   COLONOSCOPY  EP IMPLANTABLE DEVICE N/A 10/16/2016   Procedure: Loop Recorder Insertion;  Surgeon: Thompson Grayer, MD;  Location: [place] [facility];  Service: Cardiovascular;  Laterality: N/A;   GANGLION CYST EXCISION Left 06/08/2021   Procedure: LEFT VOLAR GANGLION EXCISION;  Surgeon: Sherilyn Cooter, MD;  Location: [place];  Service: Orthopedics;  Laterality: Left;   implantable loop recorder removal  09/21/2020   Reveal LINQ removed in office   LITHOTRIPSY     multiple times   PROSTATE SURGERY  08/2017   TEE WITHOUT CARDIOVERSION N/A 09/22/2014    Procedure: TRANSESOPHAGEAL ECHOCARDIOGRAM (TEE);  Surgeon: Josue Hector, MD;  Location: [facility] ENDOSCOPY;  Service: Cardiovascular;  Laterality: N/A;   WISDOM TOOTH EXTRACTION     Social History   Socioeconomic History   Marital status: Married    Spouse name: Webb Silversmith   Number of children: 3   Years of education: college   Highest education level: Master's degree (e.g., MA, MS, MEng, MEd, MSW, MBA)  Occupational History   Occupation: retired    Comment: retired  Tobacco Use   Smoking status: Never   Smokeless tobacco: Never  Vaping Use   Vaping Use: Never used  Substance and Sexual Activity   Alcohol use: Yes    Alcohol/week: 1.0 standard drink    Types: 1 Shots of liquor per week    Comment: daily   Drug use: No   Sexual activity: Yes    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 They are living in their daughter's house to help to help take care of their granddaughter.      Patient uses a home gym 3-4 x a week.      Social Determinants of Health   Financial Resource Strain: Low Risk    Difficulty of Paying Living Expenses: Not hard at all  Food Insecurity: No Food Insecurity   Worried About Charity fundraiser in the Last Year: Never true   [place] in the Last Year: Never true  Transportation Needs: No Transportation Needs   Lack of Transportation (Medical): No   Lack of Transportation (Non-Medical): No  Physical Activity: Insufficiently Active   Days of Exercise per Week: 1 day   Minutes of Exercise per Session: 60 min  Stress: No Stress Concern Present   Feeling of Stress : Not at all  Social Connections: Moderately Isolated   Frequency of Communication with Friends and Family: More than three times a week   Frequency of Social Gatherings with Friends and Family: More than  three times a week   Attends Religious Services: Never   Marine scientist or Organizations: No   Attends Music therapist: Never   Marital Status: Married   Family History  Problem Relation Age of Onset   Hypertension Mother    Hyperlipidemia Mother    Fibromyalgia Mother    Arthritis Mother        rheumatoid   Diabetes Sister        type 2   Hyperlipidemia Brother    Hypertension Brother    Prostate cancer Brother    Ulcers Father 35       Bleeding Ulcers   Kidney Stones Daughter    Asthma Daughter    Healthy  Son    Cancer Maternal Grandfather        skin ?   Stroke Maternal Aunt    Cancer Maternal Uncle        prostate       Review of Systems: Pertinent positive and negative review of systems were noted in the above HPI section. All other review of systems were otherwise negative.   Physical Exam: General: Well developed, well nourished, no acute distress Head: Normocephalic and atraumatic Eyes: Sclerae anicteric, EOMI Ears: Normal auditory acuity Mouth: Not examined, mask on during Covid-19 pandemic Neck: Supple, no masses or thyromegaly Lungs: Clear throughout to auscultation Heart: Regular rate and rhythm; no murmurs, rubs or bruits Abdomen: Soft, non tender and non distended. No masses, hepatosplenomegaly or hernias noted. Normal Bowel sounds Rectal: Not done Musculoskeletal: Symmetrical with no gross deformities  Skin: No lesions on visible extremities Pulses:  Normal pulses noted Extremities: No clubbing, cyanosis, edema or deformities noted Neurological: Alert oriented x 4, grossly nonfocal Cervical Nodes:  No significant cervical adenopathy Inguinal Nodes: No significant inguinal adenopathy Psychological:  Alert and cooperative. Normal mood and affect   Assessment and Recommendations:  Barrett's, short segment without dysplasia. GERD.  Follow antireflux measures long-term.  Continue pantoprazole 40 mg daily.  Continue famotidine 20  mg every evening as needed.  Schedule surveillance endoscopy. The risks (including bleeding, perforation, infection, missed lesions, medication reactions and possible hospitalization or surgery if complications occur), benefits, and alternatives to endoscopy with possible biopsy and possible dilation were discussed with the patient and they consent to proceed.   Constipation, bloating, gas.  Trial of taking MiraLAX daily for the next 2 to 3 weeks.  If this is not adequate change to MiraLAX once or twice daily titrated for complete bowel movement daily.  If his symptoms or not well controlled he is advised to call within the next few weeks for further recommendations. CRC screening, average risk. Colonoscopy in 12/2012 was normal. Given his age and prior normal colonoscopies there is no plan for future screening colonoscopies. He agrees with this recommendation.    cc: Mosie Lukes, MD [place],  [place] [address]

## 2021-11-13 ENCOUNTER — Ambulatory Visit: Payer: Medicare Other | Admitting: Gastroenterology

## 2021-11-13 ENCOUNTER — Encounter: Payer: Self-pay | Admitting: Gastroenterology

## 2021-11-13 VITALS — BP 113/61 | HR 77 | Temp 97.3°F | Ht 67.0 in | Wt 181.0 lb

## 2021-11-13 DIAGNOSIS — K2270 Barrett's esophagus without dysplasia: Secondary | ICD-10-CM

## 2021-11-13 MED ORDER — SODIUM CHLORIDE 0.9 % IV SOLN: 500.0000 mL | Freq: Once | INTRAVENOUS | Status: DC

## 2021-11-13 NOTE — Progress Notes (Signed)
Patient is on Plavix (due to stroke)and it was not on his medication list therefore he was not cleared or told to stop his Plavix.  Last dose of Plavix was this morning.  Per Dr Fuller Plan patient would need to have biopsies done today so procedure will have to be rescheduled.  Patient rescheduled until March 20 and Dr. Lynne Leader nurse will get clearance. New instructions printed out and given to patient.

## 2021-11-13 NOTE — Progress Notes (Signed)
Pt's states no medical or surgical changes since previsit or office visit. 

## 2021-11-14 ENCOUNTER — Ambulatory Visit (INDEPENDENT_AMBULATORY_CARE_PROVIDER_SITE_OTHER): Payer: Medicare Other | Admitting: Family Medicine

## 2021-11-14 ENCOUNTER — Telehealth: Payer: Self-pay

## 2021-11-14 ENCOUNTER — Encounter: Payer: Self-pay | Admitting: Family Medicine

## 2021-11-14 ENCOUNTER — Ambulatory Visit: Payer: Medicare Other | Admitting: Family Medicine

## 2021-11-14 VITALS — BP 110/69 | HR 81 | Temp 97.6°F | Ht 67.5 in | Wt 180.5 lb

## 2021-11-14 DIAGNOSIS — J45909 Unspecified asthma, uncomplicated: Secondary | ICD-10-CM | POA: Diagnosis not present

## 2021-11-14 DIAGNOSIS — E785 Hyperlipidemia, unspecified: Secondary | ICD-10-CM

## 2021-11-14 DIAGNOSIS — K2270 Barrett's esophagus without dysplasia: Secondary | ICD-10-CM

## 2021-11-14 DIAGNOSIS — F418 Other specified anxiety disorders: Secondary | ICD-10-CM | POA: Diagnosis not present

## 2021-11-14 MED ORDER — ALBUTEROL SULFATE HFA 108 (90 BASE) MCG/ACT IN AERS: 2.0000 | INHALATION_SPRAY | Freq: Four times a day (QID) | RESPIRATORY_TRACT | 3 refills | Status: AC | PRN

## 2021-11-14 MED ORDER — GABAPENTIN 100 MG PO CAPS: ORAL_CAPSULE | ORAL | 1 refills | Status: DC

## 2021-11-14 NOTE — Patient Instructions (Signed)
Give us 2-3 business days to get the results of your labs back.   Keep the diet clean and stay active.  Let us know if you need anything. 

## 2021-11-14 NOTE — Progress Notes (Signed)
Chief Complaint  Patient presents with   Follow-up    Subjective: Hyperlipidemia Patient presents for Hyperlipidemia follow up. Currently taking Zocor 20 mg/d and compliance with treatment thus far has been good. He denies myalgias. He is adhering to a healthy diet. Exercise: golfing, total gym, walking The patient is not known to have coexisting coronary artery disease.  BE Hx of Barrett's Esophagus on Protonix 40 mg/d. Reports compliance, no AE's. No N/V/D, bleeding, pain.   Depression/anxiety Taking Effexor XR 150 mg/d. Compliant, no AE's. No SI or HI. No self medication. Not following with a counselor or psychologist.   Past Medical History:  Diagnosis Date   Allergic state 12/29/2013   Arthritis    Barrett's esophagus    BPH (benign prostatic hyperplasia)    Chicken pox    CVA (cerebral infarction)    Depression    GERD (gastroesophageal reflux disease)    History of kidney stones    HTN (hypertension), benign 04/01/2015   Hyperglycemia 01/06/2015   Hyperlipemia    Hypertension    IBS (irritable bowel syndrome)    Left hand pain 07/21/2017   Measles as a child   Medicare annual wellness visit, subsequent 06/26/2015   Mumps as a child   Otitis externa 12/10/2013   Seasonal allergies    some asthma   Stroke ([place])    Unspecified asthma(493.90) 12/29/2013    Objective: BP 110/69    Pulse 81    Temp 97.6 F (36.4 C) (Oral)    Ht 5' 7.5" (1.715 m)    Wt 180 lb 8 oz (81.9 kg)    SpO2 99%    BMI 27.85 kg/m  General: Awake, appears stated age HEENT: MMM Heart: RRR, no LE edema, no bruits Lungs: CTAB, no rales, wheezes or rhonchi. No accessory muscle use Abd: BS+, S, NT, ND Psych: Age appropriate judgment and insight, normal affect and mood  Assessment and Plan: Hyperlipidemia, unspecified hyperlipidemia type - Plan: CANCELED: Comprehensive metabolic panel, CANCELED: Lipid panel  Barrett's esophagus without dysplasia  Depression with anxiety  Asthma - Plan:  albuterol (VENTOLIN HFA) 108 (90 Base) MCG/ACT inhaler  Chronic, stable.  Continue Zocor 20 mg daily.  Counseled on diet and exercise. Chronic, stable.  Continue Protonix 40 mg daily.  He has an EGD coming up. Chronic, stable.  Continue Effexor XR 150 mg daily.  Counseling information provided. F/u in 6 mo w reg PCP. The patient voiced understanding and agreement to the plan.  [place], DO 11/14/21  11:13 AM

## 2021-11-14 NOTE — Telephone Encounter (Signed)
-----   Message from Carl Best, RN sent at 11/13/2021  3:53 PM EST ----- Call patient to find out who prescribed his plavix & get clearance.

## 2021-11-14 NOTE — Telephone Encounter (Signed)
Patient states Dr. Antony Contras prescribes his plavix. Letter has been sent for clearance. Staff reminder sent to myself to check again prior to procedure.

## 2021-11-14 NOTE — Telephone Encounter (Signed)
Garvin Fila, MD  Carl Best, RN  I have not seen this patient since April 2021 Elpidio Galea has had no recurrent stroke or TIA symptoms he can stop Plavix for 5 days prior to scheduled endoscopy procedure and restart after procedure went safe with a small but acceptable periprocedural risk of TIA/stroke if patient is willing.  Patient has been called & verbalized understanding.

## 2021-11-15 ENCOUNTER — Telehealth: Payer: Self-pay | Admitting: Neurology

## 2021-11-15 ENCOUNTER — Other Ambulatory Visit: Payer: Self-pay

## 2021-11-15 DIAGNOSIS — G20 Parkinson's disease: Secondary | ICD-10-CM

## 2021-11-15 MED ORDER — CARBIDOPA-LEVODOPA 25-100 MG PO TABS: 1.0000 | ORAL_TABLET | Freq: Three times a day (TID) | ORAL | 0 refills | Status: DC

## 2021-11-15 NOTE — Telephone Encounter (Signed)
1. Which medications need refilled? (List name and dosage, if known) carbidopa levodopa ? ?2. Which pharmacy/location is medication to be sent to? (include street and city if local pharmacy) [facility] on [address] ? ?3. Do they need a 30 day or 90 day supply? 90 days ? ?Patient is almost out of the medication, he's requested refills from the pharmacy but it was denied. ? ?He'd like a call back.  ?

## 2021-11-15 NOTE — Telephone Encounter (Signed)
Called patient back and let him know we didn't deny any refill request for him and that I have sent in his medication carbidopa levodopa to [facility] on [address] ?

## 2021-11-26 ENCOUNTER — Other Ambulatory Visit: Payer: Self-pay

## 2021-11-26 ENCOUNTER — Encounter (HOSPITAL_BASED_OUTPATIENT_CLINIC_OR_DEPARTMENT_OTHER): Payer: Self-pay | Admitting: *Deleted

## 2021-11-26 ENCOUNTER — Emergency Department (HOSPITAL_BASED_OUTPATIENT_CLINIC_OR_DEPARTMENT_OTHER): Payer: Medicare Other

## 2021-11-26 ENCOUNTER — Emergency Department (HOSPITAL_BASED_OUTPATIENT_CLINIC_OR_DEPARTMENT_OTHER)
Admission: EM | Admit: 2021-11-26 | Discharge: 2021-11-26 | Disposition: A | Discharge status: Home / Self Care | Payer: Medicare Other | Attending: Emergency Medicine | Admitting: Emergency Medicine

## 2021-11-26 DIAGNOSIS — W06XXXA Fall from bed, initial encounter: Secondary | ICD-10-CM | POA: Diagnosis not present

## 2021-11-26 DIAGNOSIS — Z7951 Long term (current) use of inhaled steroids: Secondary | ICD-10-CM | POA: Diagnosis not present

## 2021-11-26 DIAGNOSIS — T148XXA Other injury of unspecified body region, initial encounter: Secondary | ICD-10-CM

## 2021-11-26 DIAGNOSIS — S90111A Contusion of right great toe without damage to nail, initial encounter: Secondary | ICD-10-CM | POA: Insufficient documentation

## 2021-11-26 DIAGNOSIS — S90112A Contusion of left great toe without damage to nail, initial encounter: Secondary | ICD-10-CM | POA: Diagnosis not present

## 2021-11-26 DIAGNOSIS — Z7901 Long term (current) use of anticoagulants: Secondary | ICD-10-CM | POA: Insufficient documentation

## 2021-11-26 DIAGNOSIS — Z23 Encounter for immunization: Secondary | ICD-10-CM | POA: Insufficient documentation

## 2021-11-26 DIAGNOSIS — R079 Chest pain, unspecified: Secondary | ICD-10-CM | POA: Diagnosis not present

## 2021-11-26 DIAGNOSIS — I10 Essential (primary) hypertension: Secondary | ICD-10-CM | POA: Diagnosis not present

## 2021-11-26 DIAGNOSIS — J45909 Unspecified asthma, uncomplicated: Secondary | ICD-10-CM | POA: Diagnosis not present

## 2021-11-26 DIAGNOSIS — S99921A Unspecified injury of right foot, initial encounter: Secondary | ICD-10-CM

## 2021-11-26 DIAGNOSIS — S0990XA Unspecified injury of head, initial encounter: Secondary | ICD-10-CM | POA: Diagnosis not present

## 2021-11-26 DIAGNOSIS — Y9339 Activity, other involving climbing, rappelling and jumping off: Secondary | ICD-10-CM | POA: Insufficient documentation

## 2021-11-26 DIAGNOSIS — Z043 Encounter for examination and observation following other accident: Secondary | ICD-10-CM | POA: Diagnosis not present

## 2021-11-26 DIAGNOSIS — Z87442 Personal history of urinary calculi: Secondary | ICD-10-CM | POA: Insufficient documentation

## 2021-11-26 DIAGNOSIS — M542 Cervicalgia: Secondary | ICD-10-CM | POA: Insufficient documentation

## 2021-11-26 DIAGNOSIS — M79672 Pain in left foot: Secondary | ICD-10-CM | POA: Diagnosis not present

## 2021-11-26 DIAGNOSIS — S0001XA Abrasion of scalp, initial encounter: Secondary | ICD-10-CM | POA: Diagnosis not present

## 2021-11-26 DIAGNOSIS — M79671 Pain in right foot: Secondary | ICD-10-CM | POA: Diagnosis not present

## 2021-11-26 MED ORDER — LIDOCAINE 5 % EX PTCH
1.0000 | MEDICATED_PATCH | CUTANEOUS | Status: DC
  Administered 2021-11-26: 1 via TRANSDERMAL
  Filled 2021-11-26: qty 1

## 2021-11-26 MED ORDER — TETANUS-DIPHTH-ACELL PERTUSSIS 5-2.5-18.5 LF-MCG/0.5 IM SUSY
0.5000 mL | PREFILLED_SYRINGE | Freq: Once | INTRAMUSCULAR | Status: AC
  Administered 2021-11-26: 0.5 mL via INTRAMUSCULAR
  Filled 2021-11-26: qty 0.5

## 2021-11-26 MED ORDER — OXYCODONE HCL 5 MG PO TABS: 5.0000 mg | ORAL_TABLET | ORAL | 0 refills | Status: DC | PRN

## 2021-11-26 MED ORDER — LIDOCAINE 5 % EX PTCH: 1.0000 | MEDICATED_PATCH | Freq: Every day | CUTANEOUS | 0 refills | Status: DC | PRN

## 2021-11-26 MED ORDER — HYDROCODONE-ACETAMINOPHEN 5-325 MG PO TABS
1.0000 | ORAL_TABLET | Freq: Once | ORAL | Status: AC
  Administered 2021-11-26: 1 via ORAL
  Filled 2021-11-26: qty 1

## 2021-11-26 NOTE — ED Provider Notes (Signed)
[place] EMERGENCY DEPARTMENT Provider Note   CSN: [id number] Arrival date & time: 11/26/21  1329     History  Chief Complaint  Patient presents with   Neck Pain   Head Injury    Paul Drake is a 76 y.o. male.  This is a 76 y.o. male with significant medical history as below, including CVA, BPH, hypertension hyperlipidemia, measles who presents to the ED with complaint of fall.  Head injury.  Patient reports around 2 AM this morning he was having a very vivid dream, jumped out of bed, fell to the ground.  Struck the right portion of his forehead on the ground, right side of his neck.  Both his toes; no LOC, he is on Plavix.  No thinners.  Patient was able to self ambulate back into the bed.  Has been ambulatory since the event.  Mild headache.  Mild neck pain. No numbness or tingling, no vision changes.   Unsure last tetanus shot   Past Medical History: 12/29/2013: Allergic state No date: Arthritis No date: Barrett's esophagus No date: BPH (benign prostatic hyperplasia) No date: Chicken pox No date: CVA (cerebral infarction) No date: Depression No date: GERD (gastroesophageal reflux disease) No date: History of kidney stones 04/01/2015: HTN (hypertension), benign 01/06/2015: Hyperglycemia No date: Hyperlipemia No date: Hypertension No date: IBS (irritable bowel syndrome) 07/21/2017: Left hand pain as a child: Measles 06/26/2015: Medicare annual wellness visit, subsequent as a child: Mumps 12/10/2013: Otitis externa No date: Seasonal allergies     Comment:  some asthma No date: Stroke ([place]) 12/29/2013: Unspecified asthma(493.90)  Past Surgical History: No date: COLONOSCOPY 10/16/2016: EP IMPLANTABLE DEVICE; N/A     Comment:  Procedure: Loop Recorder Insertion;  Surgeon: Thompson Grayer, MD;  Location: [facility];  Service:               Cardiovascular;  Laterality: N/A; 06/08/2021: GANGLION CYST EXCISION; Left     Comment:  Procedure:  LEFT VOLAR GANGLION EXCISION;  Surgeon:               Sherilyn Cooter, MD;  Location: [facility];  Service: Orthopedics;  Laterality: Left; 09/21/2020: implantable loop recorder removal     Comment:  Reveal LINQ removed in office No date: LITHOTRIPSY     Comment:  multiple times 08/2017: PROSTATE SURGERY 09/22/2014: TEE WITHOUT CARDIOVERSION; N/A     Comment:  Procedure: TRANSESOPHAGEAL ECHOCARDIOGRAM (TEE);                Surgeon: Josue Hector, MD;  Location: [facility] ENDOSCOPY;                Service: Cardiovascular;  Laterality: N/A; No date: WISDOM TOOTH EXTRACTION    The history is provided by the patient. No language interpreter was used.  Neck Pain Associated symptoms: headaches   Head Injury Associated symptoms: headache and neck pain       Home Medications Prior to Admission medications   Medication Sig Start Date End Date Taking? Authorizing Provider  lidocaine (LIDODERM) 5 % Place 1 patch onto the skin daily as needed. Remove & Discard patch within 12 hours or as directed by MD 11/26/21  Yes Wynona Dove A, DO  oxyCODONE (ROXICODONE) 5 MG immediate release tablet Take 1 tablet (5 mg total) by mouth every 4 (  four) hours as needed for severe pain. 11/26/21  Yes Wynona Dove A, DO  albuterol (VENTOLIN HFA) 108 (90 Base) MCG/ACT inhaler Inhale 2 puffs into the lungs every 6 (six) hours as needed for wheezing or shortness of breath. 11/14/21   Shelda Pal, DO  carbidopa-levodopa (SINEMET IR) 25-100 MG tablet Take 1 tablet by mouth 3 (three) times daily. 11/15/21   Tat, Eustace Quail, DO  clonazePAM (KLONOPIN) 0.5 MG tablet Take 1 tablet (0.5 mg total) by mouth daily. 08/18/21   Tat, Eustace Quail, DO  clopidogrel (PLAVIX) 75 MG tablet Take 75 mg by mouth daily. On this medication since 2020 after stroke    [provider]  finasteride (PROSCAR) 5 MG tablet Take 5 mg by mouth daily. 07/15/20   [provider]  fluticasone (FLONASE) 50  MCG/ACT nasal spray SHAKE LIQUID AND USE 2 SPRAYS IN [facility] NOSTRIL DAILY 03/16/21   Mosie Lukes, MD  gabapentin (NEURONTIN) 100 MG capsule TAKE 1-2 CAPSULES BY MOUTH EVERY 4 HOURS 11/14/21   Wendling, Crosby Oyster, DO  levocetirizine (XYZAL) 5 MG tablet TAKE 1 TABLET(5 MG) BY MOUTH EVERY EVENING 07/03/21   Wendling, Crosby Oyster, DO  Menthol, Topical Analgesic, (BIOFREEZE EX) Apply 1 application topically daily as needed (hand pain).    [provider]  Multiple Vitamins-Minerals (MULTIVITAMINS THER. W/MINERALS) TABS tablet Take 1 tablet by mouth daily.    [provider]  Omega-3 Fatty Acids (OMEGA-3 PO) Take 350 mg by mouth daily.    [provider]  pantoprazole (PROTONIX) 40 MG tablet TAKE 1 TABLET(40 MG) BY MOUTH DAILY 06/01/21   Mosie Lukes, MD  simvastatin (ZOCOR) 20 MG tablet TAKE 1 TABLET(20 MG) BY MOUTH DAILY 10/23/21   Mosie Lukes, MD  venlafaxine XR (EFFEXOR-XR) 150 MG 24 hr capsule TAKE ONE CAPSULE BY MOUTH DAILY AT BEDTIME 10/10/21   Mosie Lukes, MD  Zoster Vaccine Adjuvanted [facility]) injection Inject into the muscle. 09/13/21   Carlyle Basques, MD      Allergies    Molds & smuts and Pollen extract    Review of Systems   Review of Systems  Musculoskeletal:  Positive for neck pain.  Skin:  Positive for wound.  Neurological:  Positive for headaches.  All other systems reviewed and are negative.  Physical Exam Updated Vital Signs BP (!) 142/85    Pulse 71    Temp 98 F (36.7 C) (Oral)    Resp 15    Ht 5' 7.5" (1.715 m)    Wt 81.8 kg    SpO2 96%    BMI 27.83 kg/m  Physical Exam Vitals and nursing note reviewed.  Constitutional:      General: He is awake. He is not in acute distress.    Appearance: Normal appearance. He is well-developed. He is not ill-appearing, toxic-appearing or diaphoretic.  HENT:     Head: Normocephalic. Abrasion present. No raccoon eyes, Battle's sign, right periorbital erythema or left periorbital erythema.      Jaw: There is normal jaw occlusion. No trismus.      Comments: Abrasion to right temporal  area       Right Ear: Tympanic membrane and external ear normal. No hemotympanum.     Left Ear: Tympanic membrane and external ear normal. No hemotympanum.     Nose: Nose normal.     Right Nostril: No septal hematoma.     Left Nostril: No septal hematoma.     Mouth/Throat:  Mouth: Mucous membranes are moist.  Eyes:     General: No scleral icterus.    Extraocular Movements: Extraocular movements intact.     Pupils: Pupils are equal, round, and reactive to light.  Neck:   Cardiovascular:     Rate and Rhythm: Normal rate and regular rhythm.     Pulses: Normal pulses.          Radial pulses are 2+ on the right side and 2+ on the left side.       Dorsalis pedis pulses are 2+ on the right side and 2+ on the left side.     Heart sounds: Normal heart sounds.  Pulmonary:     Effort: Pulmonary effort is normal. No respiratory distress.     Breath sounds: Normal breath sounds.  Abdominal:     General: Abdomen is flat.     Palpations: Abdomen is soft.     Tenderness: There is no abdominal tenderness.  Musculoskeletal:        General: Normal range of motion.     Cervical back: Normal range of motion. No rigidity.     Right lower leg: No edema.     Left lower leg: No edema.       Feet:     Comments: No midline spinous process tenderness to palpation or percussion, no crepitus or step-off.  Pelvis stable w/ ap pressure  Feet:     Comments: Ecchymosis to bilateral hallux Skin:    General: Skin is warm and dry.     Capillary Refill: Capillary refill takes less than 2 seconds.  Neurological:     Mental Status: He is alert and oriented to person, place, and time.     GCS: GCS eye subscore is 4. GCS verbal subscore is 5. GCS motor subscore is 6.     Cranial Nerves: Cranial nerves 2-12 are intact. No dysarthria or facial asymmetry.     Sensory: Sensation is intact.     Motor: Motor function is  intact.     Coordination: Coordination is intact.  Psychiatric:        Mood and Affect: Mood normal.        Behavior: Behavior normal. Behavior is cooperative.    ED Results / Procedures / Treatments   Labs (all labs ordered are listed, but only abnormal results are displayed) Labs Reviewed - No data to display  EKG None  Radiology CT Head Wo Contrast  Result Date: 11/26/2021 CLINICAL DATA:  Fall from bed, history of stroke EXAM: CT HEAD WITHOUT CONTRAST CT CERVICAL SPINE WITHOUT CONTRAST TECHNIQUE: Multidetector CT imaging of the head and cervical spine was performed following the standard protocol without intravenous contrast. Multiplanar CT image reconstructions of the cervical spine were also generated. RADIATION DOSE REDUCTION: This exam was performed according to the departmental dose-optimization program which includes automated exposure control, adjustment of the mA and/or kV according to patient size and/or use of iterative reconstruction technique. COMPARISON:  MR brain, 12/01/2019 FINDINGS: CT HEAD FINDINGS Brain: No evidence of acute infarction, hemorrhage, hydrocephalus, extra-axial collection or mass lesion/mass effect. Encephalomalacia of the right parietal lobe (series 2, image 25). Vascular: No hyperdense vessel or unexpected calcification. Skull: Normal. Negative for fracture or focal lesion. Sinuses/Orbits: No acute finding. Other: None. CT CERVICAL SPINE FINDINGS Alignment: Normal. Skull base and vertebrae: No acute fracture. No primary bone lesion or focal pathologic process. Soft tissues and spinal canal: No prevertebral fluid or swelling. No visible canal hematoma. Disc levels: Moderate disc space  height loss and osteophytosis of C4 through C6. Otherwise intact disc spaces. Upper chest: Negative. Other: None. IMPRESSION: 1. No acute intracranial pathology. 2. Encephalomalacia of the right parietal lobe, in keeping with prior infarction. 3. No fracture or static subluxation of  the cervical spine. Electronically Signed   By: Delanna Ahmadi M.D.   On: 11/26/2021 15:07   CT Cervical Spine Wo Contrast  Result Date: 11/26/2021 CLINICAL DATA:  Fall from bed, history of stroke EXAM: CT HEAD WITHOUT CONTRAST CT CERVICAL SPINE WITHOUT CONTRAST TECHNIQUE: Multidetector CT imaging of the head and cervical spine was performed following the standard protocol without intravenous contrast. Multiplanar CT image reconstructions of the cervical spine were also generated. RADIATION DOSE REDUCTION: This exam was performed according to the departmental dose-optimization program which includes automated exposure control, adjustment of the mA and/or kV according to patient size and/or use of iterative reconstruction technique. COMPARISON:  MR brain, 12/01/2019 FINDINGS: CT HEAD FINDINGS Brain: No evidence of acute infarction, hemorrhage, hydrocephalus, extra-axial collection or mass lesion/mass effect. Encephalomalacia of the right parietal lobe (series 2, image 25). Vascular: No hyperdense vessel or unexpected calcification. Skull: Normal. Negative for fracture or focal lesion. Sinuses/Orbits: No acute finding. Other: None. CT CERVICAL SPINE FINDINGS Alignment: Normal. Skull base and vertebrae: No acute fracture. No primary bone lesion or focal pathologic process. Soft tissues and spinal canal: No prevertebral fluid or swelling. No visible canal hematoma. Disc levels: Moderate disc space height loss and osteophytosis of C4 through C6. Otherwise intact disc spaces. Upper chest: Negative. Other: None. IMPRESSION: 1. No acute intracranial pathology. 2. Encephalomalacia of the right parietal lobe, in keeping with prior infarction. 3. No fracture or static subluxation of the cervical spine. Electronically Signed   By: Delanna Ahmadi M.D.   On: 11/26/2021 15:07   DG Chest Portable 1 View  Result Date: 11/26/2021 CLINICAL DATA:  Pain EXAM: PORTABLE CHEST 1 VIEW COMPARISON:  07/24/2018 FINDINGS: The heart size  and mediastinal contours are within normal limits. Both lungs are clear. The visualized skeletal structures are unremarkable. IMPRESSION: No acute abnormality of the lungs in AP portable projection. Electronically Signed   By: Delanna Ahmadi M.D.   On: 11/26/2021 14:45   DG Foot Complete Left  Result Date: 11/26/2021 CLINICAL DATA:  Hallux pain, fall from bed EXAM: LEFT FOOT - COMPLETE 3+ VIEW; RIGHT FOOT COMPLETE - 3+ VIEW COMPARISON:  None. FINDINGS: There is no evidence of fracture or dislocation. There is no evidence of arthropathy or other focal bone abnormality. Soft tissues are unremarkable. IMPRESSION: No fracture or dislocation of the bilateral feet. Joint spaces are well preserved. Electronically Signed   By: Delanna Ahmadi M.D.   On: 11/26/2021 14:48   DG Foot Complete Right  Result Date: 11/26/2021 CLINICAL DATA:  Hallux pain, fall from bed EXAM: LEFT FOOT - COMPLETE 3+ VIEW; RIGHT FOOT COMPLETE - 3+ VIEW COMPARISON:  None. FINDINGS: There is no evidence of fracture or dislocation. There is no evidence of arthropathy or other focal bone abnormality. Soft tissues are unremarkable. IMPRESSION: No fracture or dislocation of the bilateral feet. Joint spaces are well preserved. Electronically Signed   By: Delanna Ahmadi M.D.   On: 11/26/2021 14:48    Procedures Procedures    Medications Ordered in ED Medications  lidocaine (LIDODERM) 5 % 1 patch (1 patch Transdermal Patch Applied 11/26/21 1436)  Tdap (BOOSTRIX) injection 0.5 mL (0.5 mLs Intramuscular Given 11/26/21 1435)  HYDROcodone-acetaminophen (NORCO/VICODIN) 5-325 MG per tablet 1 tablet (1 tablet Oral  Given 11/26/21 1436)    ED Course/ Medical Decision Making/ A&P                           Medical Decision Making Amount and/or Complexity of Data Reviewed Radiology: ordered.  Risk Prescription drug management.   Initial Impression and Ddx This patient presents to the Emergency Department for the above complaint. This involves an  extensive number of treatment options and is a complaint that carries with it a high risk of complications and morbidity. Vital signs were reviewed. Serious etiologies considered. Ddx includes but is not limited to: Intracranial injury, MSK, superficial injury, fracture, skull fx  Patient PMH that increases complexity of ED encounter: Prior CVA, Plavix use  Previous records obtained and reviewed   Last tetanus shot greater than 5 years ago, will update in the emergency department  Interpretation of Diagnostics imaging results that were available during my care of the patient were visualized by me and considered in my medical decision making.   I ordered imaging studies which included CT head, CT cervical spine, bilateral foot x-ray, chest x-ray and I visualized the imaging and I agree with radiologist interpretation.  No acute fracture or acute process.  Social determinants of health include - N/a    Patient Reassessment and Ultimate Disposition/Management Patient feeling better after oral analgesics, he is ambulatory with a steady gait.  Repeat neurologic exam is nonfocal.  He feels much improved after analgesics.  Patient management required discussion with the following services or consulting groups:  None  Patient presents with low mechanism head trauma. On initial evaluation patient appears in no acute distress, afebrile with normal vital signs. Patient has completely intact neurovascular exam, and pain improved in ED.     Discussed possible etiology of concussion, signs and symptoms, and discharge instructions. Detailed discussions were had with the patient regarding current findings, and need for close f/u with PCP or on call doctor. The patient has been instructed to return immediately if the symptoms worsen in any way for re-evaluation. Patient verbalized understanding and is in agreement with current care plan. All questions answered prior to discharge  Complexity of Problems  Addressed Acute illness or injury that poses threat of life of bodily function  Additional Data Reviewed and Analyzed Further history obtained from: Past medical history and medications listed in the EMR, Prior ED visit notes, Recent PCP notes, and Prior labs/imaging results  Patient Encounter Risk Assessment Prescriptions and Consideration of hospitalization      This chart was dictated using voice recognition software.  Despite best efforts to proofread,  errors can occur which can change the documentation meaning.         Final Clinical Impression(s) / ED Diagnoses Final diagnoses:  Closed head injury, initial encounter  Injury of toe on right foot, initial encounter  Abrasion    Rx / DC Orders ED Discharge Orders          Ordered    oxyCODONE (ROXICODONE) 5 MG immediate release tablet  Every 4 hours PRN        11/26/21 1521    lidocaine (LIDODERM) 5 %  Daily PRN        11/26/21 1521              Wynona Dove A, DO 11/26/21 1524

## 2021-11-26 NOTE — ED Notes (Signed)
Pt. To radiology ... will give meds when he returns ?

## 2021-11-26 NOTE — Discharge Instructions (Addendum)
You may take over-the-counter Motrin or Tylenol as needed for any discomfort.  If this does not improve your discomfort you may take oxycodone for severe pain ? ? ? ?Based on the events which brought you to the ER today, it is possible that you may have a concussion. A concussion occurs when there is a blow to the head or body, with enough force to shake the brain and disrupt how the brain functions. You may experience symptoms such as headaches, sensitivity to light/noise, dizziness, cognitive slowing, difficulty concentrating / remembering, trouble sleeping and drowsiness. These symptoms may last anywhere from hours/days to potentially weeks/months. While these symptoms are very frustrating and perhaps debilitating, it is important that you remember that they will improve over time. Everyone has a different rate of recovery; it is difficult to predict when your symptoms will resolve. In order to allow for your brain to heal after the injury, we recommend that you see your primary physician or a physician knowledgeable in concussion management. We also advise you to let your body and brain rest: avoid physical activities (sports, gym, and exercise) and reduce cognitive demands (reading, texting, TV watching, computer use, video games, etc). School attendance, after-school activities and work may need to be modified to avoid increasing symptoms. We recommend against driving until until all symptoms have resolved.  Come back to the ER right away if you are having repeated episodes of vomiting, severe/worsening headache/dizziness or any other symptom that alarms you. We recommended that someone stay with you for the next 24 hours to monitor for these worrisome symptoms. ? ? ? ?It was a pleasure caring for you today in the emergency department. ? ?Please return to the emergency department for any worsening or worrisome symptoms. ?

## 2021-11-26 NOTE — ED Triage Notes (Addendum)
Pt states he has a sleep disorder and fell out of bed while dreaming and landed on his head. C/o pain in neck and head. Abrasion noted to scalp. Denies LOC. Pt ambulated to room 12 with steady gait. Dr. Pearline Cables at bedside to evaluate pt upon arrival to room 12. Also has pain and bruising to right great toe. Cao x 4. NAD. Stroke screen neg. States hx of old cva with some residual weakness to left hand. He spoke with triage nurse pta and was advised to come to ED today ?

## 2021-11-27 ENCOUNTER — Telehealth: Payer: Self-pay

## 2021-11-27 NOTE — Telephone Encounter (Signed)
Patient evaluated and treated in ED.  ? 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 ?Patient Name:Paul Drake ?Initial Comment Caller states PT/ SX FELL -HIT HIS HEAD, ONE ?OR TWO BROKEN BIG TOES. PT WOULD ?LIKE TO BE SEEN FOR APPT TOMORROW IF ?POSSIBLE. ?[place] Not Listed [facility] [place] ?Translation No ?Nurse Assessment ?Nurse: Eugenio Hoes, RN, Sarah Date/Time Eilene Ghazi Time): 11/26/2021 11:39:04 AM ?Confirm and document reason for call. If ?symptomatic, describe symptoms. ?---Caller states he fell out of bed last night striking the ?front of his head on the carpet. This morning, his neck ?feels very stiff on one side. He is also complaining of ?pain in both great toes. The right great toe is bloody ?and discolored. The left big toe is swollen in red. ?Does the patient have any new or worsening ?symptoms? ---Yes ?Will a triage be completed? ---Yes ?Related visit to physician within the last 2 weeks? ---No ?Does the PT have any chronic conditions? (i.e. ?diabetes, asthma, this includes High risk factors for ?pregnancy, etc.) ?---Yes ?List chronic conditions. ---Parkinson's, sleep disorder, cva x2, heart murmur ?Is this a behavioral health or substance abuse call? ---No ?Guidelines ?Guideline Title Affirmed Question Affirmed Notes Nurse Date/Time (Eastern ?Time) ?Neck Injury High-risk adult ?(e.g., age > 44 ?years, rheumatoid ?arthritis, ankylosing ?Eugenio Hoes, RN, Sarah 11/26/2021 11:41:12 ?AM ?PLEASE NOTE: All timestamps contained within this report are represented as [place] Standard Time. ?CONFIDENTIALTY NOTICE: This fax transmission is intended only for the addressee. It contains information that is legally privileged, confidential or ?otherwise protected from use or disclosure. If you are not the intended recipient, you are strictly prohibited from reviewing, disclosing, copying using ?or disseminating any of this information or taking any action in reliance on or  regarding this information. If you have received this fax in error, please ?notify us immediately by telephone so that we can arrange for its return to [place]. Phone: [phone], Toll-Free: [phone], Fax: [phone] ?Page: 2 of 2 ?Call Id: [id number] ?Guidelines ?Guideline Title Affirmed Question Affirmed Notes Nurse Date/Time (Eastern ?Time) ?spondylitis, cervical ?spine abnormalities) ?Disp. Time (Eastern ?Time) Disposition Final User ?11/26/2021 11:38:02 AM Send to Urgent Myles Lipps, Rosemarie Ax ?11/26/2021 11:45:14 AM See HCP within 4 Hours (or ?PCP triage) ?Yes Eugenio Hoes, RN, Sarah ?Caller Disagree/Comply Comply ?Caller Understands Yes ?PreDisposition Go to Urgent Care/Walk-In Clinic ?Care Advice Given Per Guideline ?SEE HCP (OR PCP TRIAGE) WITHIN 4 HOURS: * IF OFFICE WILL BE CLOSED AND NO PCP (PRIMARY CARE ?PROVIDER) SECOND-LEVEL TRIAGE: You need to be seen within the next 3 or 4 hours. A nearby Urgent [facility]) ?is often a good source of care. Another choice is to go to the ED. Go sooner if you become worse. CALL BACK IF: * You become ?worse CARE ADVICE given per Neck Injury (Adult) guideline. ?Referrals ?GO TO FACILITY OTHER - SPECIFY ?

## 2021-11-28 ENCOUNTER — Encounter: Payer: Self-pay | Admitting: Family Medicine

## 2021-11-28 ENCOUNTER — Ambulatory Visit (INDEPENDENT_AMBULATORY_CARE_PROVIDER_SITE_OTHER): Payer: Medicare Other | Admitting: Family Medicine

## 2021-11-28 VITALS — BP 123/82 | HR 85 | Temp 98.3°F | Ht 67.5 in | Wt 178.5 lb

## 2021-11-28 DIAGNOSIS — S060X0A Concussion without loss of consciousness, initial encounter: Secondary | ICD-10-CM | POA: Diagnosis not present

## 2021-11-28 NOTE — Progress Notes (Signed)
Chief Complaint  ?Patient presents with  ? Follow-up  ?  ER ?Head injury and both feet/toes injured ?Discuss levocitirizine ?  ? ? ?Subjective: ?Patient is a 76 y.o. male here for ED f/u. ? ?Had a vivid dream and came out of bed and hit his head. Went to the ED. Imaging unremarkable. Continues to have a headache starting in the back of his neck and radiates around. Trouble swallowing due to neck muscle strain. Was given hydrocodone in ED. No mentation issues, N/V, vision changes, difficulty w speech, weakness, numbness, tingling. +hx of concussions, most recent being a couple years.   ? ?Past Medical History:  ?Diagnosis Date  ? Allergic state 12/29/2013  ? Arthritis   ? Barrett's esophagus   ? BPH (benign prostatic hyperplasia)   ? Chicken pox   ? CVA (cerebral infarction)   ? Depression   ? GERD (gastroesophageal reflux disease)   ? History of kidney stones   ? HTN (hypertension), benign 04/01/2015  ? Hyperglycemia 01/06/2015  ? Hyperlipemia   ? Hypertension   ? IBS (irritable bowel syndrome)   ? Left hand pain 07/21/2017  ? Measles as a child  ? Medicare annual wellness visit, subsequent 06/26/2015  ? Mumps as a child  ? Otitis externa 12/10/2013  ? Seasonal allergies   ? some asthma  ? Stroke [facility])   ? Unspecified asthma(493.90) 12/29/2013  ? ? ?Objective: ?BP 123/82   Pulse 85   Temp 98.3 ?F (36.8 ?C) (Oral)   Ht 5' 7.5" (1.715 m)   Wt 178 lb 8 oz (81 kg)   SpO2 98%   BMI 27.54 kg/m?  ?General: Awake, appears stated age ?HEENT: PERRLA, uvula rose symmetrically upon phonation ?Neuro: DTRs equal and symmetric throughout, gait is normal, no cerebellar signs, 5/5 strength throughout ?MSK: TTP in the suboccipital triangle and cervical paraspinal musculature bilaterally, there is also tenderness over the bilateral SCM muscles ?Lungs:  No accessory muscle use ?Psych: Age appropriate judgment and insight, normal affect and mood ? ?Assessment and Plan: ?Concussion without loss of consciousness, initial  encounter ? ?Over-the-counter supplements for respective concussive symptoms provided in paperwork.  Stretches and exercises given for the neck.  Heat, ice, Tylenol.  Stop hydrocodone as this could slow brain healing.  He will send me a message in a few weeks if he is not improving.  We will refer him to the concussion clinic at that time. ?The patient voiced understanding and agreement to the plan. ? ?I spent 30 minutes with the patient discussing the above plan in addition to reviewing his ER visit/chart on the same day of the visit. ? ?Shelda Pal, DO ?11/28/21  ?4:29 PM ? ? ? ? ?

## 2021-11-28 NOTE — Patient Instructions (Addendum)
Heat (pad or rice pillow in microwave) over affected area, 10-15 minutes twice daily.  ? ?Ice/cold pack over area for 10-15 min twice daily. ? ?OK to take Tylenol 1000 mg (2 extra strength tabs) or 975 mg (3 regular strength tabs) every 6 hours as needed. ? ?Neosporin twice daily for the forehead. ? ?If you aren't improving over the next few weeks, send me a message and we will set you up with the concussion clinic.  ? ?Let us know if you need anything. ? ?Fish oil 3 grams daily for 10 days then 2 grams daily  ?Vitamin D 4000 IU daily  ?CoQ10 '200mg'$  daily for headaches  ?Tart cherry extract any dose at night ? ?To help improve COGNITIVE function: ?Using fish oil/omega 3 that is 1000 mg (or roughly 600 mg EPA/DHA), starting as soon as possible after concussion, take: ?3 tabs THREE TIMES a day  for the first 3 days, then (you will smell a little, sorry) ?3 tabs TWICE DAILY  for the next 3 days, then ?3 tabs ONCE DAILY  for the next 10 days ?   ?To help reduce HEADACHES: ?Coenzyme Q10 '160mg'$  ONCE DAILY ?Riboflavin/Vitamin B2 '400mg'$  ONCE DAILY ?Magnesium oxide 400 mg ONE-TWO TIMES DAILY ?May stop after headaches are resolved.   ?                                                                                            ?To help with INSOMNIA: ?Melatonin 3-'5mg'$  AT BEDTIME ?Tart cherry extract, any dose at night ?   ?Other medicines to help decrease inflammation ?Alpha Lipoic Acid '100mg'$  TWICE DAILY ?Turmeric '500mg'$  twice daily ?Iron '65mg'$  elemental daily ?Vitamin D 4000 IU daily for 2 weeks then 2000 IU daily thereafter. ?

## 2021-11-30 ENCOUNTER — Telehealth: Payer: Self-pay | Admitting: Gastroenterology

## 2021-11-30 NOTE — Telephone Encounter (Signed)
Good morning Dr. Fuller Plan, ? ?Patient called to cancel his procedure.  He had a fall and was informed by his provider to not stop the blood thinners.  He will call at a later date to reschedule. ?

## 2021-12-01 DIAGNOSIS — R1032 Left lower quadrant pain: Secondary | ICD-10-CM | POA: Diagnosis not present

## 2021-12-01 DIAGNOSIS — R310 Gross hematuria: Secondary | ICD-10-CM | POA: Diagnosis not present

## 2021-12-01 DIAGNOSIS — N202 Calculus of kidney with calculus of ureter: Secondary | ICD-10-CM | POA: Diagnosis not present

## 2021-12-04 ENCOUNTER — Encounter: Payer: Medicare Other | Admitting: Gastroenterology

## 2022-01-09 ENCOUNTER — Other Ambulatory Visit: Payer: Self-pay | Admitting: Family Medicine

## 2022-02-06 ENCOUNTER — Ambulatory Visit: Payer: Medicare Other | Attending: Internal Medicine

## 2022-02-06 DIAGNOSIS — Z23 Encounter for immunization: Secondary | ICD-10-CM

## 2022-02-06 NOTE — Progress Notes (Signed)
   Covid-19 Vaccination Clinic  Name:  Gilman Olazabal    MRN: [id number] DOB: September 11, 1946  02/06/2022  Mr. Graumann was observed post Covid-19 immunization for 15 minutes without incident. He was provided with Vaccine Information Sheet and instruction to access the V-Safe system.   Mr. Hoffmeister was instructed to call 911 with any severe reactions post vaccine: Difficulty breathing  Swelling of face and throat  A fast heartbeat  A bad rash all over body  Dizziness and weakness   Immunizations Administered     Name Date Dose VIS Date Route   Pfizer Covid-19 Vaccine Bivalent Booster 02/06/2022 11:10 AM 0.3 mL 05/17/2021 Intramuscular   Manufacturer: Sunrise   Lot: [id number]   [place]: [phone]

## 2022-02-06 NOTE — Progress Notes (Signed)
Assessment/Plan:   1.  Parkinsons Disease  -increase carbidopa/levodopa 25/100, 2 at 7am, 1 at 10:30, 1 at 2 pm, 1 at 5:30 pm  -exercise  -He follows regularly with dermatology.  -refer to [facility] PT (can't do land PT right now b/c of foot pain)   2.  RBD  -Continue clonazepam 0.5 mg at bedtime  -Despite clonazepam, the patient had a very serious incident where he jumped out of the bed while acting out a dream and hit his forehead on the ground.  We discussed safety in the bedroom. He did lower his bed to platform bed.  I would like him to use bed rails.  -pt quit drinking after one event of falling OOB, and he feels he is doing better.  Proud of him for doing so   Subjective:   Paul Drake was seen today in follow up for Parkinsons disease.  My previous records were reviewed prior to todays visit as well as outside records available to me.  Patient was in the emergency room March 12 after a fall.  Emergency room records are reviewed.  Patient actually was having a dream and jumped/fell out of the bed.  He hit his forehead on the ground.  CT brain was negative, with the exception of evidence of old right infarct in the right parietal lobe.  He states that he quit drinking and it helped the RBD.  He did lower the bed to a platform bed.  He is not taking the venlafaxine and hasn't in some time.  He has not been able to exercise because he injured his toes when he fell out of the bed.  He is taking his med at 7am/10:30/2pm.  He doesn't do well in the late evening and hand seems to hurt.  He states that was previously medicating with an "old fashioned" in the evening and now that he quit drinking, he has trouble in the late evening  Current prescribed movement disorder medications: Carbidopa/levodopa 25/100, 1 tablet 3 times per day  Clonazepam 0.5 mg at bedtime   ALLERGIES:   Allergies  Allergen Reactions   Molds & Smuts Other (See Comments)   Pollen Extract Other (See Comments)     Runny nose,itchy eyes (trees, grasses, etc)     CURRENT MEDICATIONS:  Outpatient Encounter Medications as of 02/13/2022  Medication Sig   albuterol (VENTOLIN HFA) 108 (90 Base) MCG/ACT inhaler Inhale 2 puffs into the lungs every 6 (six) hours as needed for wheezing or shortness of breath.   carbidopa-levodopa (SINEMET IR) 25-100 MG tablet Take 1 tablet by mouth 3 (three) times daily.   clonazePAM (KLONOPIN) 0.5 MG tablet Take 1 tablet (0.5 mg total) by mouth daily.   clopidogrel (PLAVIX) 75 MG tablet Take 75 mg by mouth daily. On this medication since 2020 after stroke   COVID-19 mRNA bivalent vaccine, Pfizer, (PFIZER COVID-19 VAC BIVALENT) injection Inject into the muscle.   finasteride (PROSCAR) 5 MG tablet Take 5 mg by mouth daily.   fluticasone (FLONASE) 50 MCG/ACT nasal spray SHAKE LIQUID AND USE 2 SPRAYS IN EACH NOSTRIL DAILY   gabapentin (NEURONTIN) 100 MG capsule TAKE 1-2 CAPSULES BY MOUTH EVERY 4 HOURS   levocetirizine (XYZAL) 5 MG tablet TAKE 1 TABLET(5 MG) BY MOUTH EVERY EVENING   lidocaine (LIDODERM) 5 % Place 1 patch onto the skin daily as needed. Remove & Discard patch within 12 hours or as directed by MD   Menthol, Topical Analgesic, (BIOFREEZE EX) Apply 1 application topically  daily as needed (hand pain).   Multiple Vitamins-Minerals (MULTIVITAMINS THER. W/MINERALS) TABS tablet Take 1 tablet by mouth daily.   Omega-3 Fatty Acids (OMEGA-3 PO) Take 350 mg by mouth daily.   oxyCODONE (ROXICODONE) 5 MG immediate release tablet Take 1 tablet (5 mg total) by mouth every 4 (four) hours as needed for severe pain.   pantoprazole (PROTONIX) 40 MG tablet TAKE 1 TABLET(40 MG) BY MOUTH DAILY   simvastatin (ZOCOR) 20 MG tablet TAKE 1 TABLET(20 MG) BY MOUTH DAILY   Zoster Vaccine Adjuvanted [facility]) injection Inject into the muscle.   venlafaxine XR (EFFEXOR-XR) 150 MG 24 hr capsule TAKE ONE CAPSULE BY MOUTH DAILY AT BEDTIME (Patient not taking: Reported on 02/13/2022)    Facility-Administered Encounter Medications as of 02/13/2022  Medication   0.9 %  sodium chloride infusion    Objective:   PHYSICAL EXAMINATION:    VITALS:   Vitals:   02/13/22 1502  BP: 120/73  Pulse: 88  SpO2: 99%  Weight: 178 lb (80.7 kg)  Height: 5' 7.5" (1.715 m)      GEN:  The patient appears stated age and is in NAD. HEENT:  Normocephalic, atraumatic.  The mucous membranes are moist. The superficial temporal arteries are without ropiness or tenderness. CV:  RRR Lungs:  CTAB Neck/HEME:  There are no carotid bruits bilaterally.  Neurological examination:  Orientation: The patient is alert and oriented x3. Cranial nerves: There is good facial symmetry with minimal facial hypomimia. The speech is fluent and clear. Soft palate rises symmetrically and there is no tongue deviation. Hearing is intact to conversational tone. Sensation: Sensation is intact to light touch throughout Motor: Strength is at least antigravity x4.  Movement examination: Tone: There is mild to mod increased tone in the RUE and mild in the LUE Abnormal movements: none today Coordination:  There is mild with hand opening and closing bilaterally, R>L Gait and Station: The patient has no difficulty arising out of a deep-seated chair without the use of the hands. The patient's stride length is tenuous  I have reviewed and interpreted the following labs independently    Chemistry      Component Value Date/Time   NA 141 10/23/2021 0916   K 4.6 10/23/2021 0916   CL 104 10/23/2021 0916   CO2 31 10/23/2021 0916   BUN 21 10/23/2021 0916   CREATININE 1.26 10/23/2021 0916   CREATININE 1.38 (H) 07/04/2020 1004      Component Value Date/Time   CALCIUM 9.3 10/23/2021 0916   ALKPHOS 63 10/23/2021 0916   AST 14 10/23/2021 0916   ALT 7 10/23/2021 0916   BILITOT 0.6 10/23/2021 0916       Lab Results  Component Value Date   WBC 6.6 10/23/2021   HGB 13.2 10/23/2021   HCT 39.9 10/23/2021   MCV  97.3 10/23/2021   PLT 171.0 10/23/2021    Lab Results  Component Value Date   TSH 2.25 10/23/2021   Total time spent on today's visit was 31 minutes, including both face-to-face time and nonface-to-face time.  Time included that spent on review of records (prior notes available to me/labs/imaging if pertinent), discussing treatment and goals, answering patient's questions and coordinating care.    Cc:  Mosie Lukes, MD

## 2022-02-09 ENCOUNTER — Other Ambulatory Visit (HOSPITAL_BASED_OUTPATIENT_CLINIC_OR_DEPARTMENT_OTHER): Payer: Self-pay

## 2022-02-09 DIAGNOSIS — Z23 Encounter for immunization: Secondary | ICD-10-CM | POA: Diagnosis not present

## 2022-02-09 MED ORDER — PFIZER COVID-19 VAC BIVALENT 30 MCG/0.3ML IM SUSP
INTRAMUSCULAR | 0 refills | Status: DC
  Filled 2022-02-09: qty 0.3, 1d supply, fill #0

## 2022-02-13 ENCOUNTER — Encounter: Payer: Self-pay | Admitting: Neurology

## 2022-02-13 ENCOUNTER — Ambulatory Visit (INDEPENDENT_AMBULATORY_CARE_PROVIDER_SITE_OTHER): Payer: Medicare Other | Admitting: Neurology

## 2022-02-13 VITALS — BP 120/73 | HR 88 | Ht 67.5 in | Wt 178.0 lb

## 2022-02-13 DIAGNOSIS — G20 Parkinson's disease: Secondary | ICD-10-CM | POA: Diagnosis not present

## 2022-02-13 MED ORDER — CARBIDOPA-LEVODOPA 25-100 MG PO TABS: ORAL_TABLET | ORAL | 1 refills | Status: DC

## 2022-02-13 NOTE — Patient Instructions (Addendum)
Take carbidopa/levodopa 25/100, 2 at 7am, 1 at 10:30, 1 at 2 pm, 1 at 5:30 pm  Local and Online Resources for Power over Parkinson's Group May 2023  LOCAL Paul Drake PARKINSON'S GROUPS  Power over Parkinson's Group:   Power Over Parkinson's Patient Education Group will be Wednesday, May 10th-*Hybrid meting*- in person at [place] location and via [facility] at 2:00 pm.   Upcoming Power over Parkinson's Meetings:  2nd Wednesdays of the month at 2 pm:  May 10th, June 14th, July 12th Contact Amy Marriott at [email or web address] if interested in participating in this group Parkinson's Care Partners Group:    3rd Mondays, Contact Misty Paladino Atypical Parkinsonian Patient Group:   4th Wednesdays, [place] If you are interested in participating in these groups with Misty, please contact her directly for how to join those meetings.  Her contact information is [email or web address].    LOCAL EVENTS AND NEW OFFERINGS Moving Day [place]:  Saturday, May 6th, 9:30 am at [place], [place], [place]. Participate in Moving Day as a way to "honor loved ones, raise funds, fight Parkinson's disease, and celebrate movement."  Register today at [employer].[email or web address]!  Play [place]!  Join [place] for home game for a fun evening to bring awareness of Parkinson's and raise funds for our Movement Disorder Funds. Rescheduled to May 11th  6:30 pm [place]. To purchase tickets:  [email or web address] Parkinson's T-shirts for sale!  Designed by a local group member, with funds going to [place].  $25.00  Investment banker, corporate to purchase  [employer]! Moves [employer] Instructor-Led Class offering at [employer]!  Wednesdays 1-2 pm, starting April 12th.   Contact Bryson Dames, Acupuncturist at [employer].  [email or web address]  [place]:  [email or web address] PD Health at Home continues:  Mindfulness Mondays, Wellness Wednesdays, Fitness Fridays  Upcoming Education:  Understanding Gene and Cell-Based Therapies in Parkinson's.  Wednesday, May 10th at 1:00 pm Additional Education offerings virtually through their website-upcoming topics include Palliative Care/Hospice and PD, Sleep and PD Register for expert briefings (webinars) at [email or web address] Please check out their website to sign up for emails and see their full online offerings   [place]:  [email or web address]  Third Thursday Webinars:  On the third Thursday of every month at 12 p.m. ET, join our free live webinars to learn about various aspects of living with Parkinson's disease and our work to speed medical breakthroughs. Upcoming Webinar: Get Moving: Exercising for a Healthy Brain.  Thursday, May 18th  at  12 noon. Check out additional information on their website to see their full online offerings  [facility]:  [email or web address] Upcoming Webinar:   Stay tuned Webinar Series:  Living with Parkinson's Meetup.   Third Thursdays each month, 3 pm Care Partner Monthly Meetup.  With Robin Searing [place].  First Tuesday of each month, 2 pm Check out additional information to Live Well Today on their website  Parkinson and Movement Disorders (PMD) Alliance:  www.[email or web address] [employer] Online:  Online Education Events Sign up for emails, which are sent weekly to give you updates on programming and online offerings  Parkinson's Association of the [place]:  www.[email or web address] Information on online support groups, education events, and online exercises including Yoga, Parkinson's exercises and more-LOTS of information on links to PD resources and online events Virtual Support Group through Parkinson's Association of the  [place]; next one is scheduled for Wednesday, May 3rd at 2 pm. (These are typically  scheduled for the 1st Wednesday of the month at 2 pm).  Visit website for details. MOVEMENT AND EXERCISE OPPORTUNITIES Parkinson's DRUMMING Classes/Music Therapy with Doylene Canning:  This is a returning class and it's FREE!  2nd Mondays, continuing May 8th, 11:00 at the [place].  Contact *Misty Taylor-Paladino at [email or web address] or Doylene Canning at [phone] or [email or web address]  PWR! Moves Classes at [employer].  Wednesdays 10 and 11 am.   Contact Amy Marriott, PT [email or web address] if interested. NEW PWR! Moves Class offering at [employer].  Wednesdays 1-2 pm, starting April 12th.  Contact Bryson Dames, Acupuncturist at [employer].  [email or web address] Here is a link to the PWR!Moves classes on Zoom from [place] - Daily Mon-Sat at 10:00. Via Zoom, FREE and open to all.  There is also a link below via Facebook if you use that platform.  [email or web address].si [email or web address]  Parkinson's Wellness Recovery (PWR! Moves)  www.[email or web address] Info on the PWR! Virtual Experience:  You will have access to our expertise through self-assessment, guided plans that start with the PD-specific fundamentals, educational content, tips, Q&A with an expert, and a growing Art therapist of PD-specific pre-recorded and live exercise classes of varying types and intensity - both physical and cognitive! If that is not enough, we offer 1:1 wellness consultations (in-person or virtual) to personalize your PWR! Research scientist (medical).  [place] Fridays:  As part of the PD Health @ Home program, this free video series  focuses each week on one aspect of fitness designed to support people living with Parkinson's.  These weekly videos highlight the [place] recent fitness guidelines for people with Parkinson's disease. ModemGamers.si Dance for PD website is offering free, live-stream classes throughout the week, as well as links to [employer] of classes:  https://[email or web address].[email or web address]/ Virtual dance and Pilates for Parkinson's classes: Click on the Community Tab> Parkinson's Movement Initiative Tab.  To register for classes and for more information, visit www.[email or web address] and click the "community" tab.  [employer] Parkinson's Cycling Classes  [employer]:  Thursdays @ Noon-Live classes at [employer] ([employer] at [email or web address] .[email or web address] Ricker or [phone]) [employer]: Virtual Classes Mondays and Thursdays Jeanette Caprice classes Tuesday, Wednesday and Thursday (contact [place] at [email or web address]  or [phone]) [place] Varied levels of classes are offered Mondays, Tuesdays and Thursdays at [employer].  Stretching with Verdis Frederickson weekly class is also offered for people with Parkinson's To observe a class or for more information, call [phone] or email Hezzie Bump at [email or web address] .[email or web address] ADDITIONAL SUPPORT AND RESOURCES Well[employer]:Online Caregiver Education Opportunities:  www.well-springsolutions.org/caregiver-education/caregiver-support-group.  You may also contact Vickki Muff at [email or web address] or [phone].    Well-Spring Navigator:  009-233-0076 program, a free service to help individuals and families through the journey of determining care for older adults.  The "Navigator" is a [employer], Education officer, museum, who will speak with a prospective client and/or loved ones to provide an assessment of the situation and a set of recommendations for a  personalized care plan -- all free of charge, and whether Well[employer]Spring [employer] offers the needed service or not. If the need is not a service we provide, we are well-connected with reputable programs in town that we can refer you to.  www.well-springsolutions.org or to speak with the Navigator, call [phone].

## 2022-02-19 ENCOUNTER — Encounter: Payer: Self-pay | Admitting: Family Medicine

## 2022-02-19 ENCOUNTER — Ambulatory Visit (INDEPENDENT_AMBULATORY_CARE_PROVIDER_SITE_OTHER): Payer: Medicare Other | Admitting: Family Medicine

## 2022-02-19 VITALS — BP 113/80 | HR 66 | Temp 97.4°F | Ht 67.5 in | Wt 178.0 lb

## 2022-02-19 DIAGNOSIS — B078 Other viral warts: Secondary | ICD-10-CM | POA: Diagnosis not present

## 2022-02-19 DIAGNOSIS — J4521 Mild intermittent asthma with (acute) exacerbation: Secondary | ICD-10-CM | POA: Diagnosis not present

## 2022-02-19 DIAGNOSIS — J301 Allergic rhinitis due to pollen: Secondary | ICD-10-CM

## 2022-02-19 DIAGNOSIS — D489 Neoplasm of uncertain behavior, unspecified: Secondary | ICD-10-CM

## 2022-02-19 MED ORDER — PREDNISONE 20 MG PO TABS
40.0000 mg | ORAL_TABLET | Freq: Every day | ORAL | 0 refills | Status: AC
Start: 2022-02-19 — End: 2022-02-24

## 2022-02-19 NOTE — Addendum Note (Signed)
Addended by: Sharon Seller B on: 02/19/2022 04:26 PM   Modules accepted: Orders

## 2022-02-19 NOTE — Patient Instructions (Addendum)
Consider Flonase in May and June next year. You can use with Zyrtec.  Let me know if anything changes.   Send me a message if you are still having issues.  Do not shower for the rest of the day. When you do wash it, use only soap and water. Do not vigorously scrub. Apply triple antibiotic ointment (like Neosporin) twice daily. Keep the area clean and dry.   Things to look out for: increasing pain not relieved by ibuprofen/acetaminophen, fevers, spreading redness, drainage of pus, or foul odor.  Give [place] 1 week to get the results of your biopsy back.  Let us know if you need anything.

## 2022-02-19 NOTE — Progress Notes (Addendum)
Chief Complaint  Patient presents with   Cough   Allergies   Ear Pain    headache    Paul Drake here for URI complaints.  Duration:  14  days  Associated symptoms: sinus congestion, rhinorrhea, itchy watery eyes, ear fullness, ear pain, wheezing, shortness of breath, and cough Denies: sinus pain, ear drainage, sore throat, shortness of breath, myalgia, and fevers Treatment to date: Zyrtec Sick contacts: No  Lesion on L leg for last few weeks. No new topicals, pain, itching, drainage. No sick contacts, can't get in with derm for another couple months.   Past Medical History:  Diagnosis Date   Allergic state 12/29/2013   Arthritis    Barrett's esophagus    BPH (benign prostatic hyperplasia)    Chicken pox    CVA (cerebral infarction)    Depression    GERD (gastroesophageal reflux disease)    History of kidney stones    HTN (hypertension), benign 04/01/2015   Hyperglycemia 01/06/2015   Hyperlipemia    Hypertension    IBS (irritable bowel syndrome)    Left hand pain 07/21/2017   Measles as a child   Medicare annual wellness visit, subsequent 06/26/2015   Mumps as a child   Otitis externa 12/10/2013   Seasonal allergies    some asthma   Stroke ([place])    Unspecified asthma(493.90) 12/29/2013    Objective BP 113/80   Pulse 66   Temp (!) 97.4 F (36.3 C) (Oral)   Ht 5' 7.5" (1.715 m)   Wt 178 lb (80.7 kg)   SpO2 99%   BMI 27.47 kg/m  General: Awake, alert, appears stated age HEENT: AT, [place], ears patent b/l and TM's neg, nares patent w/o discharge, pharynx pink and without exudates, MMM Neck: No masses or asymmetry Heart: RRR Lungs: CTAB, no accessory muscle use Skin: 0.5 x 0.7 cm raised and scaly lesion on the L postero-lateral leg. No ttp, drainage, fluctuance, erythema, warmth, edema.  Psych: Age appropriate judgment and insight, normal mood and affect  Procedure note; shave biopsy Informed consent was obtained. Indication: Diagnosis The area was cleaned with  alcohol and injected with 0.5 mL of 1% lidocaine with epinephrine. A Dermablade was slightly bent and used to cut under the area of interest. The specimen was placed in a sterile specimen cup and sent to the lab. The area was then cauterized ensuring adequate hemostasis. The area was dressed with triple antibiotic ointment and a bandage. There were no complications noted. The patient tolerated the procedure well.  Allergic rhinitis due to pollen, unspecified seasonality  Mild intermittent asthma with acute exacerbation - Plan: predniSONE (DELTASONE) 20 MG tablet  1/2. Exacerbation of chronic issue.  5-day prednisone burst 40 mg daily.  Should add Flonase to Zyrtec for prevention in the future.  He will let me know if anything changes. F/u prn. If starting to experience fevers, shaking, or shortness of breath, seek immediate care. 3. New issue, biopsy today. Aftercare instructions verbalized and written down.  F/u w reg PCP as originally scheduled.  Pt voiced understanding and agreement to the plan.  [place], DO 02/19/22 4:03 PM

## 2022-02-19 NOTE — Addendum Note (Signed)
Addended by: Ames Coupe on: 02/19/2022 04:27 PM   Modules accepted: Orders

## 2022-02-26 ENCOUNTER — Telehealth: Payer: Self-pay | Admitting: Neurology

## 2022-02-26 ENCOUNTER — Other Ambulatory Visit: Payer: Self-pay

## 2022-02-26 MED ORDER — CLONAZEPAM 0.5 MG PO TABS: 0.5000 mg | ORAL_TABLET | Freq: Every day | ORAL | 4 refills | Status: DC

## 2022-02-26 NOTE — Telephone Encounter (Signed)
Called patient and let him know the pharmacy has not sent me any requested for his medication and I will send to the Dr. To get approval right away

## 2022-02-26 NOTE — Telephone Encounter (Signed)
Patient called and stated he is having trouble with his refill for clonazepam.  He uses the [facility] on [address]

## 2022-03-04 ENCOUNTER — Emergency Department (HOSPITAL_BASED_OUTPATIENT_CLINIC_OR_DEPARTMENT_OTHER)
Admission: EM | Admit: 2022-03-04 | Discharge: 2022-03-04 | Disposition: A | Discharge status: Home / Self Care | Payer: Medicare Other | Attending: Emergency Medicine | Admitting: Emergency Medicine

## 2022-03-04 ENCOUNTER — Other Ambulatory Visit: Payer: Self-pay

## 2022-03-04 ENCOUNTER — Emergency Department (HOSPITAL_BASED_OUTPATIENT_CLINIC_OR_DEPARTMENT_OTHER): Payer: Medicare Other

## 2022-03-04 ENCOUNTER — Encounter (HOSPITAL_BASED_OUTPATIENT_CLINIC_OR_DEPARTMENT_OTHER): Payer: Self-pay | Admitting: Emergency Medicine

## 2022-03-04 DIAGNOSIS — I10 Essential (primary) hypertension: Secondary | ICD-10-CM | POA: Diagnosis not present

## 2022-03-04 DIAGNOSIS — J45909 Unspecified asthma, uncomplicated: Secondary | ICD-10-CM | POA: Diagnosis not present

## 2022-03-04 DIAGNOSIS — R42 Dizziness and giddiness: Secondary | ICD-10-CM | POA: Insufficient documentation

## 2022-03-04 DIAGNOSIS — R262 Difficulty in walking, not elsewhere classified: Secondary | ICD-10-CM | POA: Diagnosis not present

## 2022-03-04 DIAGNOSIS — R519 Headache, unspecified: Secondary | ICD-10-CM | POA: Insufficient documentation

## 2022-03-04 DIAGNOSIS — R29818 Other symptoms and signs involving the nervous system: Secondary | ICD-10-CM | POA: Diagnosis not present

## 2022-03-04 DIAGNOSIS — Z7902 Long term (current) use of antithrombotics/antiplatelets: Secondary | ICD-10-CM | POA: Insufficient documentation

## 2022-03-04 DIAGNOSIS — Z8673 Personal history of transient ischemic attack (TIA), and cerebral infarction without residual deficits: Secondary | ICD-10-CM | POA: Insufficient documentation

## 2022-03-04 LAB — CBC WITH DIFFERENTIAL/PLATELET
Abs Immature Granulocytes: 0.02 10*3/uL (ref 0.00–0.07)
Basophils Absolute: 0.1 10*3/uL (ref 0.0–0.1)
Basophils Relative: 1 %
Eosinophils Absolute: 0.3 10*3/uL (ref 0.0–0.5)
Eosinophils Relative: 5 %
HCT: 36.5 % — ABNORMAL LOW (ref 39.0–52.0)
Hemoglobin: 12 g/dL — ABNORMAL LOW (ref 13.0–17.0)
Immature Granulocytes: 0 %
Lymphocytes Relative: 22 %
Lymphs Abs: 1.2 10*3/uL (ref 0.7–4.0)
MCH: 32.5 pg (ref 26.0–34.0)
MCHC: 32.9 g/dL (ref 30.0–36.0)
MCV: 98.9 fL (ref 80.0–100.0)
Monocytes Absolute: 0.5 10*3/uL (ref 0.1–1.0)
Monocytes Relative: 10 %
Neutro Abs: 3.5 10*3/uL (ref 1.7–7.7)
Neutrophils Relative %: 62 %
Platelets: 162 10*3/uL (ref 150–400)
RBC: 3.69 MIL/uL — ABNORMAL LOW (ref 4.22–5.81)
RDW: 12.3 % (ref 11.5–15.5)
WBC: 5.5 10*3/uL (ref 4.0–10.5)
nRBC: 0 % (ref 0.0–0.2)

## 2022-03-04 LAB — COMPREHENSIVE METABOLIC PANEL
ALT: <5 U/L (ref 0–44)
AST: 19 U/L (ref 15–41)
Albumin: 3.6 g/dL (ref 3.5–5.0)
Alkaline Phosphatase: 62 U/L (ref 38–126)
Anion gap: 3 — ABNORMAL LOW (ref 5–15)
BUN: 19 mg/dL (ref 8–23)
CO2: 27 mmol/L (ref 22–32)
Calcium: 8.9 mg/dL (ref 8.9–10.3)
Chloride: 109 mmol/L (ref 98–111)
Creatinine, Ser: 1.47 mg/dL — ABNORMAL HIGH (ref 0.61–1.24)
GFR, Estimated: 49 mL/min — ABNORMAL LOW (ref >60)
Glucose, Bld: 106 mg/dL — ABNORMAL HIGH (ref 70–99)
Potassium: 4 mmol/L (ref 3.5–5.1)
Sodium: 139 mmol/L (ref 135–145)
Total Bilirubin: 0.6 mg/dL (ref 0.3–1.2)
Total Protein: 6.4 g/dL — ABNORMAL LOW (ref 6.5–8.1)

## 2022-03-04 MED ORDER — MECLIZINE HCL 25 MG PO TABS
50.0000 mg | ORAL_TABLET | Freq: Once | ORAL | Status: AC
  Administered 2022-03-04: 50 mg via ORAL
  Filled 2022-03-04: qty 2

## 2022-03-04 NOTE — ED Notes (Signed)
Pt was able to drive self here; sts dizziness is only when he walks

## 2022-03-04 NOTE — ED Triage Notes (Signed)
Pt thinks he has an inner ear syndrome. Has had similar episodes before. Reports dizziness (room spinning sensation) and nausea since Thursday. Also reports intermittent bilateral ear pain. Pt ambulatory to triage.

## 2022-03-04 NOTE — ED Provider Notes (Signed)
[place] EMERGENCY DEPARTMENT Provider Note   CSN: [id number] Arrival date & time: 03/04/22  1052     History {Add pertinent medical, surgical, social history, OB history to HPI:1} Chief Complaint  Patient presents with   Dizziness    Paul Drake Due is a 76 y.o. male.  HPI        History of allergies, received prednisone last week finished it midweek, cough and congestion improved after that  Thursday suddenly developed dizziness, feels off balance, very off balance, not truly dizzy/room spinning.  If lay head back sometimes the room feels more up and down movement.  Constant if walking, if not walking feels ok.  Worse with head movements, worse with sitting to standing, laying down to getting up.  Hx of 2 strokes, left leg and hand numb from prior strokes, not changes.  No new numbness or weakness, no difficulty talking, no change in vision, no difficulty swallowing  Dull headaches, off and on for the last few weeks Nausea, no vomiting No diarrhea, black or bloody stools Has hx of abd pain   Home Medications Prior to Admission medications   Medication Sig Start Date End Date Taking? Authorizing Provider  albuterol (VENTOLIN HFA) 108 (90 Base) MCG/ACT inhaler Inhale 2 puffs into the lungs every 6 (six) hours as needed for wheezing or shortness of breath. 11/14/21   Shelda Pal, DO  carbidopa-levodopa (SINEMET IR) 25-100 MG tablet 2 at 7am, 1 at 10:30, 1 at 2 pm, 1 at 5:30 pm 02/13/22   Tat, Eustace Quail, DO  clonazePAM (KLONOPIN) 0.5 MG tablet Take 1 tablet (0.5 mg total) by mouth daily. 02/26/22   Tat, Eustace Quail, DO  clopidogrel (PLAVIX) 75 MG tablet Take 75 mg by mouth daily. On this medication since 2020 after stroke    [provider]  COVID-19 mRNA bivalent vaccine, Pfizer, (PFIZER COVID-19 VAC BIVALENT) injection Inject into the muscle. 02/06/22   Carlyle Basques, MD  finasteride (PROSCAR) 5 MG tablet Take 5 mg by mouth daily. 07/15/20    [provider]  fluticasone (FLONASE) 50 MCG/ACT nasal spray SHAKE LIQUID AND USE 2 SPRAYS IN [facility] NOSTRIL DAILY Patient not taking: Reported on 02/19/2022 03/16/21   Mosie Lukes, MD  gabapentin (NEURONTIN) 100 MG capsule TAKE 1-2 CAPSULES BY MOUTH EVERY 4 HOURS 11/14/21   Wendling, Crosby Oyster, DO  levocetirizine (XYZAL) 5 MG tablet TAKE 1 TABLET(5 MG) BY MOUTH EVERY EVENING 07/03/21   Wendling, Crosby Oyster, DO  lidocaine (LIDODERM) 5 % Place 1 patch onto the skin daily as needed. Remove & Discard patch within 12 hours or as directed by MD 11/26/21   Jeanell Sparrow, DO  Menthol, Topical Analgesic, (BIOFREEZE EX) Apply 1 application topically daily as needed (hand pain).    [provider]  Multiple Vitamins-Minerals (MULTIVITAMINS THER. W/MINERALS) TABS tablet Take 1 tablet by mouth daily.    [provider]  Omega-3 Fatty Acids (OMEGA-3 PO) Take 350 mg by mouth daily.    [provider]  oxyCODONE (ROXICODONE) 5 MG immediate release tablet Take 1 tablet (5 mg total) by mouth every 4 (four) hours as needed for severe pain. 11/26/21   Wynona Dove A, DO  pantoprazole (PROTONIX) 40 MG tablet TAKE 1 TABLET(40 MG) BY MOUTH DAILY 06/01/21   Mosie Lukes, MD  simvastatin (ZOCOR) 20 MG tablet TAKE 1 TABLET(20 MG) BY MOUTH DAILY 10/23/21   Mosie Lukes, MD  Zoster Vaccine Adjuvanted [facility]) injection Inject into the muscle.  09/13/21   Carlyle Basques, MD      Allergies    Molds & smuts and Pollen extract    Review of Systems   Review of Systems  Physical Exam Updated Vital Signs BP 134/78   Pulse 68   Temp 98 F (36.7 C) (Oral)   Resp 16   SpO2 100%  Physical Exam  ED Results / Procedures / Treatments   Labs (all labs ordered are listed, but only abnormal results are displayed) Labs Reviewed  CBC WITH DIFFERENTIAL/PLATELET  COMPREHENSIVE METABOLIC PANEL    EKG None  Radiology No results found.  Procedures Procedures  {Document  cardiac monitor, telemetry assessment procedure when appropriate:1}  Medications Ordered in ED Medications  meclizine (ANTIVERT) tablet 50 mg (50 mg Oral Given 03/04/22 1152)    ED Course/ Medical Decision Making/ A&P                           Medical Decision Making Amount and/or Complexity of Data Reviewed Labs: ordered.   ***  {Document critical care time when appropriate:1} {Document review of labs and clinical decision tools ie heart score, Chads2Vasc2 etc:1}  {Document your independent review of radiology images, and any outside records:1} {Document your discussion with family members, caretakers, and with consultants:1} {Document social determinants of health affecting pt's care:1} {Document your decision making why or why not admission, treatments were needed:1} Final Clinical Impression(s) / ED Diagnoses Final diagnoses:  None    Rx / DC Orders ED Discharge Orders     None

## 2022-03-04 NOTE — ED Notes (Signed)
Patient left without being seen, states "I would have died if I had a stroke by now"

## 2022-03-04 NOTE — ED Notes (Signed)
Patient's IV removed by this tech.

## 2022-03-04 NOTE — ED Notes (Signed)
Patient from [facility] for MRI-patient alert and oriented, awaiting MRI

## 2022-03-05 ENCOUNTER — Telehealth: Payer: Medicare Other | Admitting: Physician Assistant

## 2022-03-05 ENCOUNTER — Telehealth: Payer: Self-pay | Admitting: Family Medicine

## 2022-03-05 ENCOUNTER — Encounter: Payer: Self-pay | Admitting: Family Medicine

## 2022-03-05 DIAGNOSIS — H66003 Acute suppurative otitis media without spontaneous rupture of ear drum, bilateral: Secondary | ICD-10-CM

## 2022-03-05 DIAGNOSIS — R42 Dizziness and giddiness: Secondary | ICD-10-CM | POA: Diagnosis not present

## 2022-03-05 MED ORDER — MECLIZINE HCL 25 MG PO TABS: 25.0000 mg | ORAL_TABLET | Freq: Three times a day (TID) | ORAL | 0 refills | Status: DC | PRN

## 2022-03-05 MED ORDER — NEOMYCIN-POLYMYXIN-HC 3.5-10000-1 OT SOLN: 3.0000 [drp] | Freq: Four times a day (QID) | OTIC | 0 refills | Status: DC

## 2022-03-05 MED ORDER — AMOXICILLIN-POT CLAVULANATE 875-125 MG PO TABS: 1.0000 | ORAL_TABLET | Freq: Two times a day (BID) | ORAL | 0 refills | Status: DC

## 2022-03-05 NOTE — Telephone Encounter (Signed)
Pt called in for clogged ears, he stated he went to ED yesterday and was sent to the hospital done get some x-rays done. He left before they could be completed. He called in today to be seen, advised pt we do not have anything but walked him thought how to do an e-visit on mychart. Also advised him if he was concerned he could speak with a triage nurse to see if they recommend UC. He sttaed he may see about scheduling on mychart and wanted a message to be sent to pcp.

## 2022-03-05 NOTE — Progress Notes (Signed)
Virtual Visit Consent   Paul Drake, you are scheduled for a virtual visit with a [place] [facility] provider today. Just as with appointments in the office, your consent must be obtained to participate. Your consent will be active for this visit and any virtual visit you may have with one of our providers in the next 365 days. If you have a MyChart account, a copy of this consent can be sent to you electronically.  As this is a virtual visit, video technology does not allow for your provider to perform a traditional examination. This may limit your provider's ability to fully assess your condition. If your provider identifies any concerns that need to be evaluated in person or the need to arrange testing (such as labs, EKG, etc.), we will make arrangements to do so. Although advances in technology are sophisticated, we cannot ensure that it will always work on either your end or our end. If the connection with a video visit is poor, the visit may have to be switched to a telephone visit. With either a video or telephone visit, we are not always able to ensure that we have a secure connection.  By engaging in this virtual visit, you consent to the provision of healthcare and authorize for your insurance to be billed (if applicable) for the services provided during this visit. Depending on your insurance coverage, you may receive a charge related to this service.  I need to obtain your verbal consent now. Are you willing to proceed with your visit today? Paul Drake has provided verbal consent on 03/05/2022 for a virtual visit (video or telephone). Paul Daring, PA-C  Date: 03/05/2022 9:47 AM  Virtual Visit via Video Note   I, Paul Drake, connected with  Paul Drake  ([id number], [birth year]) on 03/05/22 at  9:00 AM EDT by a video-enabled telemedicine application and verified that I am speaking with the correct person using two identifiers.  Location: Patient: Virtual Visit Location  Patient: Home Provider: Virtual Visit Location Provider: Home Office   I discussed the limitations of evaluation and management by telemedicine and the availability of in person appointments. The patient expressed understanding and agreed to proceed.    History of Present Illness: Paul Drake is a 76 y.o. who identifies as a male who was assigned male at birth, and is being seen today for dizziness.  HPI: Dizziness This is a new problem. The current episode started in the past 7 days (Thursday). Episode frequency: worse in the morning, improves by midday; reports this happens yearly at this time. The problem has been gradually worsening. Associated symptoms include congestion, fatigue, headaches, nausea, vertigo and vomiting. Pertinent negatives include no chills, myalgias, numbness, sore throat, visual change or weakness. Associated symptoms comments: Reports it is similar to previous ear infections with associated dizziness, reports has had since he was 13, ear congestion. Reports he just finished prednisone for the same and it helped resolve his symptoms previously (prednisone given on 02/19/22). Exacerbated by: lying in bed. Treatments tried: meclizine. The treatment provided no relief.   Did go to ER yesterday. Labs and CT reassuring. Was awaiting MRI, but there was a long wait ([place] down an MRI) so he left. They did give him meclizine yesterday and that helped his symptoms, but after sleeping overnight, dizziness returned this morning.    Problems:  Patient Active Problem List   Diagnosis Date Noted   Peripheral neuropathy 06/19/2021   Macrocytic anemia 06/19/2021   Ganglion cyst of volar aspect  of left wrist 05/23/2021   Sinusitis 02/02/2021   Urinary frequency 06/27/2020   Finger pain, left 04/15/2020   Tinnitus of both ears 02/08/2020   Headache 02/08/2020   Parkinson's disease ([place]) 11/29/2019   Ganglion cyst of dorsum of left wrist 05/19/2018   Elevated testosterone level in male  05/19/2018   REM behavioral disorder 01/27/2018   Asthma 10/30/2017   Vertebrobasilar artery syndrome 07/29/2017   Left hand pain 07/21/2017   Chronic deep vein thrombosis (DVT) of popliteal vein of right lower extremity ([place]) 01/17/2017   TIA (transient ischemic attack) 09/26/2016   HLD (hyperlipidemia) 02/14/2016   Cerebrovascular accident (CVA) due to embolism of right middle cerebral artery ([place]) 08/15/2015   Medicare annual wellness visit, subsequent 06/26/2015   Eustachian tube dysfunction 03/11/2015   Hyperglycemia 01/06/2015   Allergic rhinitis 12/15/2014   Otitis media 12/15/2014   Vertigo 12/15/2014   PFO (patent foramen ovale) 11/24/2014   Essential hypertension 11/24/2014   DVT, lower extremity ([place]) 10/04/2014   Barrett's esophagus 08/16/2014   Lumbago 08/16/2014   IBS (irritable bowel syndrome) 08/16/2014   Abdominal pain 07/06/2014   Allergic state 12/29/2013   Unspecified asthma(493.90) 12/29/2013   GERD (gastroesophageal reflux disease)    Need for prophylactic vaccination with combined diphtheria-tetanus-pertussis (DTP) vaccine 11/15/2013   BPH (benign prostatic hyperplasia) 11/15/2013   Renal lithiasis 11/15/2013   Depression with anxiety 11/15/2013   Anosmia 11/15/2013   History of CVA (cerebrovascular accident) 11/15/2013   Keystone arthritis, thumb, degenerative 11/04/2013    Allergies:  Allergies  Allergen Reactions   Molds & Smuts Other (See Comments)   Pollen Extract Other (See Comments)    Runny nose,itchy eyes (trees, grasses, etc)    Medications:  Current Outpatient Medications:    amoxicillin-clavulanate (AUGMENTIN) 875-125 MG tablet, Take 1 tablet by mouth 2 (two) times daily., Disp: 14 tablet, Rfl: 0   meclizine (ANTIVERT) 25 MG tablet, Take 1 tablet (25 mg total) by mouth 3 (three) times daily as needed for dizziness., Disp: 30 tablet, Rfl: 0   neomycin-polymyxin-hydrocortisone (CORTISPORIN) OTIC solution, Place 3 drops into both ears 4 (four)  times daily. X 5 days, Disp: 10 mL, Rfl: 0   albuterol (VENTOLIN HFA) 108 (90 Base) MCG/ACT inhaler, Inhale 2 puffs into the lungs every 6 (six) hours as needed for wheezing or shortness of breath., Disp: 1 each, Rfl: 3   carbidopa-levodopa (SINEMET IR) 25-100 MG tablet, 2 at 7am, 1 at 10:30, 1 at 2 pm, 1 at 5:30 pm, Disp: 450 tablet, Rfl: 1   clonazePAM (KLONOPIN) 0.5 MG tablet, Take 1 tablet (0.5 mg total) by mouth daily., Disp: 30 tablet, Rfl: 4   clopidogrel (PLAVIX) 75 MG tablet, Take 75 mg by mouth daily. On this medication since 2020 after stroke, Disp: , Rfl:    COVID-19 mRNA bivalent vaccine, Pfizer, (PFIZER COVID-19 VAC BIVALENT) injection, Inject into the muscle., Disp: 0.3 mL, Rfl: 0   finasteride (PROSCAR) 5 MG tablet, Take 5 mg by mouth daily., Disp: , Rfl:    fluticasone (FLONASE) 50 MCG/ACT nasal spray, SHAKE LIQUID AND USE 2 SPRAYS IN EACH NOSTRIL DAILY (Patient not taking: Reported on 02/19/2022), Disp: 16 g, Rfl: 5   gabapentin (NEURONTIN) 100 MG capsule, TAKE 1-2 CAPSULES BY MOUTH EVERY 4 HOURS, Disp: 540 capsule, Rfl: 1   levocetirizine (XYZAL) 5 MG tablet, TAKE 1 TABLET(5 MG) BY MOUTH EVERY EVENING, Disp: 30 tablet, Rfl: 2   lidocaine (LIDODERM) 5 %, Place 1 patch onto the skin daily as  needed. Remove & Discard patch within 12 hours or as directed by MD, Disp: 15 patch, Rfl: 0   Menthol, Topical Analgesic, (BIOFREEZE EX), Apply 1 application topically daily as needed (hand pain)., Disp: , Rfl:    Multiple Vitamins-Minerals (MULTIVITAMINS THER. W/MINERALS) TABS tablet, Take 1 tablet by mouth daily., Disp: , Rfl:    Omega-3 Fatty Acids (OMEGA-3 PO), Take 350 mg by mouth daily., Disp: , Rfl:    oxyCODONE (ROXICODONE) 5 MG immediate release tablet, Take 1 tablet (5 mg total) by mouth every 4 (four) hours as needed for severe pain., Disp: 5 tablet, Rfl: 0   pantoprazole (PROTONIX) 40 MG tablet, TAKE 1 TABLET(40 MG) BY MOUTH DAILY, Disp: 90 tablet, Rfl: 3   simvastatin (ZOCOR) 20 MG  tablet, TAKE 1 TABLET(20 MG) BY MOUTH DAILY, Disp: 90 tablet, Rfl: 1   Zoster Vaccine Adjuvanted (SHINGRIX) injection, Inject into the muscle., Disp: 1 each, Rfl: 0  Current Facility-Administered Medications:    0.9 %  sodium chloride infusion, 500 mL, Intravenous, Once, Ladene Artist, MD  Observations/Objective: Patient is well-developed, well-nourished in no acute distress.  Resting comfortably at home.  Head is normocephalic, atraumatic.  No labored breathing.  Speech is clear and coherent with logical content.  Patient is alert and oriented at baseline.    Assessment and Plan: 1. Non-recurrent acute suppurative otitis media of both ears without spontaneous rupture of tympanic membranes - amoxicillin-clavulanate (AUGMENTIN) 875-125 MG tablet; Take 1 tablet by mouth 2 (two) times daily.  Dispense: 14 tablet; Refill: 0 - neomycin-polymyxin-hydrocortisone (CORTISPORIN) OTIC solution; Place 3 drops into both ears 4 (four) times daily. X 5 days  Dispense: 10 mL; Refill: 0  2. Dizziness - meclizine (ANTIVERT) 25 MG tablet; Take 1 tablet (25 mg total) by mouth 3 (three) times daily as needed for dizziness.  Dispense: 30 tablet; Refill: 0  - Being that labs and CT were negative yesterday and symptoms are consistent with previous issues (per patient) I will treat as otitis media/sinusitis - Mild signs of dehydration; push fluids - Did have decline in hemoglobin, follow up with PCP - Augmentin and cortisporin prescribed - Meclizine for home prescription - Strict ER precautions if symptoms worsen - Follow up with PCP if symptoms stable but not improving  Follow Up Instructions: I discussed the assessment and treatment plan with the patient. The patient was provided an opportunity to ask questions and all were answered. The patient agreed with the plan and demonstrated an understanding of the instructions.  A copy of instructions were sent to the patient via MyChart unless otherwise noted  below.    The patient was advised to call back or seek an in-person evaluation if the symptoms worsen or if the condition fails to improve as anticipated.  Time:  I spent 15 minutes with the patient via telehealth technology discussing the above problems/concerns.    Paul Daring, PA-C

## 2022-03-05 NOTE — Patient Instructions (Signed)
Paul Drake, thank you for joining Mar Daring, PA-C for today's virtual visit.  While this provider is not your primary care provider (PCP), if your PCP is located in our provider database this encounter information will be shared with them immediately following your visit.  Consent: (Patient) Paul Drake provided verbal consent for this virtual visit at the beginning of the encounter.  Current Medications:  Current Outpatient Medications:    amoxicillin-clavulanate (AUGMENTIN) 875-125 MG tablet, Take 1 tablet by mouth 2 (two) times daily., Disp: 14 tablet, Rfl: 0   meclizine (ANTIVERT) 25 MG tablet, Take 1 tablet (25 mg total) by mouth 3 (three) times daily as needed for dizziness., Disp: 30 tablet, Rfl: 0   neomycin-polymyxin-hydrocortisone (CORTISPORIN) OTIC solution, Place 3 drops into both ears 4 (four) times daily. X 5 days, Disp: 10 mL, Rfl: 0   albuterol (VENTOLIN HFA) 108 (90 Base) MCG/ACT inhaler, Inhale 2 puffs into the lungs every 6 (six) hours as needed for wheezing or shortness of breath., Disp: 1 each, Rfl: 3   carbidopa-levodopa (SINEMET IR) 25-100 MG tablet, 2 at 7am, 1 at 10:30, 1 at 2 pm, 1 at 5:30 pm, Disp: 450 tablet, Rfl: 1   clonazePAM (KLONOPIN) 0.5 MG tablet, Take 1 tablet (0.5 mg total) by mouth daily., Disp: 30 tablet, Rfl: 4   clopidogrel (PLAVIX) 75 MG tablet, Take 75 mg by mouth daily. On this medication since 2020 after stroke, Disp: , Rfl:    COVID-19 mRNA bivalent vaccine, Pfizer, (PFIZER COVID-19 VAC BIVALENT) injection, Inject into the muscle., Disp: 0.3 mL, Rfl: 0   finasteride (PROSCAR) 5 MG tablet, Take 5 mg by mouth daily., Disp: , Rfl:    fluticasone (FLONASE) 50 MCG/ACT nasal spray, SHAKE LIQUID AND USE 2 SPRAYS IN EACH NOSTRIL DAILY (Patient not taking: Reported on 02/19/2022), Disp: 16 g, Rfl: 5   gabapentin (NEURONTIN) 100 MG capsule, TAKE 1-2 CAPSULES BY MOUTH EVERY 4 HOURS, Disp: 540 capsule, Rfl: 1   levocetirizine (XYZAL) 5 MG tablet,  TAKE 1 TABLET(5 MG) BY MOUTH EVERY EVENING, Disp: 30 tablet, Rfl: 2   lidocaine (LIDODERM) 5 %, Place 1 patch onto the skin daily as needed. Remove & Discard patch within 12 hours or as directed by MD, Disp: 15 patch, Rfl: 0   Menthol, Topical Analgesic, (BIOFREEZE EX), Apply 1 application topically daily as needed (hand pain)., Disp: , Rfl:    Multiple Vitamins-Minerals (MULTIVITAMINS THER. W/MINERALS) TABS tablet, Take 1 tablet by mouth daily., Disp: , Rfl:    Omega-3 Fatty Acids (OMEGA-3 PO), Take 350 mg by mouth daily., Disp: , Rfl:    oxyCODONE (ROXICODONE) 5 MG immediate release tablet, Take 1 tablet (5 mg total) by mouth every 4 (four) hours as needed for severe pain., Disp: 5 tablet, Rfl: 0   pantoprazole (PROTONIX) 40 MG tablet, TAKE 1 TABLET(40 MG) BY MOUTH DAILY, Disp: 90 tablet, Rfl: 3   simvastatin (ZOCOR) 20 MG tablet, TAKE 1 TABLET(20 MG) BY MOUTH DAILY, Disp: 90 tablet, Rfl: 1   Zoster Vaccine Adjuvanted (SHINGRIX) injection, Inject into the muscle., Disp: 1 each, Rfl: 0  Current Facility-Administered Medications:    0.9 %  sodium chloride infusion, 500 mL, Intravenous, Once, Ladene Artist, MD   Medications ordered in this encounter:  Meds ordered this encounter  Medications   meclizine (ANTIVERT) 25 MG tablet    Sig: Take 1 tablet (25 mg total) by mouth 3 (three) times daily as needed for dizziness.    Dispense:  30 tablet  Refill:  0    Order Specific Question:   Supervising Provider    Answer:   Sabra Heck, BRIAN [[id number]]   amoxicillin-clavulanate (AUGMENTIN) 875-125 MG tablet    Sig: Take 1 tablet by mouth 2 (two) times daily.    Dispense:  14 tablet    Refill:  0    Order Specific Question:   Supervising Provider    Answer:   MILLER, BRIAN [[id number]]   neomycin-polymyxin-hydrocortisone (CORTISPORIN) OTIC solution    Sig: Place 3 drops into both ears 4 (four) times daily. X 5 days    Dispense:  10 mL    Refill:  0    Order Specific Question:   Supervising Provider     Answer:   Sabra Heck, BRIAN [[id number]]     *If you need refills on other medications prior to your next appointment, please contact your pharmacy*  Follow-Up: Call back or seek an in-person evaluation if the symptoms worsen or if the condition fails to improve as anticipated.  Other Instructions Dizziness Dizziness is a common problem. It makes you feel unsteady or light-headed. You may feel like you are about to pass out (faint). Dizziness can lead to getting hurt if you stumble or fall. Dizziness can be caused by many things, including: Medicines. Not having enough water in your body (dehydration). Illness. Follow these instructions at home: Eating and drinking  Drink enough fluid to keep your pee (urine) pale yellow. This helps to keep you from getting dehydrated. Try to drink more clear fluids, such as water. Do not drink alcohol. Limit how much caffeine you drink or eat, if your doctor tells you to do that. Limit how much salt (sodium) you drink or eat, if your doctor tells you to do that. Activity pibName   If you have been instructed to have an in-person evaluation today at a local Urgent Care facility, please use the link below. It will take you to a list of all of our available [facility], including address, phone number and hours of operation. Please do not delay care.  [facility]  If you or a family member do not have a primary care provider, use the link below to schedule a visit and establish care. When you choose a [facility] primary care physician or advanced practice provider, you gain a long-term partner in health. Find a Primary Care Provider  Learn more about [facility]'s in-office and virtual care options: [place] [facility]

## 2022-03-05 NOTE — Telephone Encounter (Signed)
Noted. Pt seen in ED for dizziness. Eloped due to wait time. Pt had EKG, blood work and CT head. Pt left before doing [place]. Called pt and left VM for return call.

## 2022-03-06 ENCOUNTER — Ambulatory Visit: Payer: Medicare Other | Admitting: Physical Therapy

## 2022-03-06 ENCOUNTER — Telehealth: Payer: Self-pay

## 2022-03-06 NOTE — Telephone Encounter (Signed)
Pts wife called stating that Pt had been having dizziness that started on Thursday. He went to [place] [facility] ER Sunday and had a CT- nml,  He ended up having a VV yesterday and was prescribed Meclizine and ear drops. Dizziness seems to be better around lunch time. He does have abdominal pain that he had to take Noro for- today he threw that up. Some constipation as well.   Wife would like to know:  How long should the dizziness last? Is it okay to give him Metamucil or similar tx?  CM number: [phone]

## 2022-03-07 ENCOUNTER — Emergency Department (HOSPITAL_BASED_OUTPATIENT_CLINIC_OR_DEPARTMENT_OTHER): Payer: Medicare Other

## 2022-03-07 ENCOUNTER — Inpatient Hospital Stay (HOSPITAL_COMMUNITY): Payer: Medicare Other

## 2022-03-07 ENCOUNTER — Encounter (HOSPITAL_BASED_OUTPATIENT_CLINIC_OR_DEPARTMENT_OTHER): Payer: Self-pay | Admitting: Emergency Medicine

## 2022-03-07 ENCOUNTER — Observation Stay (HOSPITAL_BASED_OUTPATIENT_CLINIC_OR_DEPARTMENT_OTHER)
Admission: EM | Admit: 2022-03-07 | Discharge: 2022-03-08 | Disposition: A | Discharge status: Home / Self Care | Payer: Medicare Other | Attending: Student | Admitting: Student

## 2022-03-07 ENCOUNTER — Other Ambulatory Visit: Payer: Self-pay

## 2022-03-07 DIAGNOSIS — D72829 Elevated white blood cell count, unspecified: Secondary | ICD-10-CM | POA: Insufficient documentation

## 2022-03-07 DIAGNOSIS — K56609 Unspecified intestinal obstruction, unspecified as to partial versus complete obstruction: Secondary | ICD-10-CM | POA: Diagnosis not present

## 2022-03-07 DIAGNOSIS — J45909 Unspecified asthma, uncomplicated: Secondary | ICD-10-CM | POA: Insufficient documentation

## 2022-03-07 DIAGNOSIS — Z7902 Long term (current) use of antithrombotics/antiplatelets: Secondary | ICD-10-CM | POA: Diagnosis not present

## 2022-03-07 DIAGNOSIS — K760 Fatty (change of) liver, not elsewhere classified: Secondary | ICD-10-CM | POA: Insufficient documentation

## 2022-03-07 DIAGNOSIS — N179 Acute kidney failure, unspecified: Secondary | ICD-10-CM

## 2022-03-07 DIAGNOSIS — G450 Vertebro-basilar artery syndrome: Secondary | ICD-10-CM | POA: Diagnosis present

## 2022-03-07 DIAGNOSIS — G20 Parkinson's disease: Secondary | ICD-10-CM | POA: Diagnosis not present

## 2022-03-07 DIAGNOSIS — N200 Calculus of kidney: Secondary | ICD-10-CM | POA: Diagnosis not present

## 2022-03-07 DIAGNOSIS — R1032 Left lower quadrant pain: Secondary | ICD-10-CM | POA: Diagnosis present

## 2022-03-07 DIAGNOSIS — K429 Umbilical hernia without obstruction or gangrene: Secondary | ICD-10-CM | POA: Diagnosis not present

## 2022-03-07 DIAGNOSIS — Z79899 Other long term (current) drug therapy: Secondary | ICD-10-CM | POA: Insufficient documentation

## 2022-03-07 DIAGNOSIS — Z8673 Personal history of transient ischemic attack (TIA), and cerebral infarction without residual deficits: Secondary | ICD-10-CM | POA: Diagnosis not present

## 2022-03-07 DIAGNOSIS — E785 Hyperlipidemia, unspecified: Secondary | ICD-10-CM | POA: Diagnosis present

## 2022-03-07 DIAGNOSIS — I700 Atherosclerosis of aorta: Secondary | ICD-10-CM | POA: Insufficient documentation

## 2022-03-07 DIAGNOSIS — I10 Essential (primary) hypertension: Secondary | ICD-10-CM | POA: Diagnosis not present

## 2022-03-07 DIAGNOSIS — R42 Dizziness and giddiness: Secondary | ICD-10-CM | POA: Diagnosis not present

## 2022-03-07 DIAGNOSIS — H81399 Other peripheral vertigo, unspecified ear: Secondary | ICD-10-CM | POA: Diagnosis not present

## 2022-03-07 DIAGNOSIS — N400 Enlarged prostate without lower urinary tract symptoms: Secondary | ICD-10-CM | POA: Diagnosis present

## 2022-03-07 DIAGNOSIS — K5669 Other partial intestinal obstruction: Secondary | ICD-10-CM | POA: Diagnosis not present

## 2022-03-07 DIAGNOSIS — I951 Orthostatic hypotension: Secondary | ICD-10-CM

## 2022-03-07 DIAGNOSIS — Z4659 Encounter for fitting and adjustment of other gastrointestinal appliance and device: Secondary | ICD-10-CM | POA: Diagnosis not present

## 2022-03-07 DIAGNOSIS — K219 Gastro-esophageal reflux disease without esophagitis: Secondary | ICD-10-CM | POA: Diagnosis present

## 2022-03-07 DIAGNOSIS — H6690 Otitis media, unspecified, unspecified ear: Secondary | ICD-10-CM | POA: Diagnosis present

## 2022-03-07 DIAGNOSIS — F418 Other specified anxiety disorders: Secondary | ICD-10-CM | POA: Diagnosis present

## 2022-03-07 LAB — URINALYSIS, ROUTINE W REFLEX MICROSCOPIC
Bilirubin Urine: NEGATIVE
Glucose, UA: NEGATIVE mg/dL
Hgb urine dipstick: NEGATIVE
Ketones, ur: NEGATIVE mg/dL
Leukocytes,Ua: NEGATIVE
Nitrite: NEGATIVE
Protein, ur: NEGATIVE mg/dL
Specific Gravity, Urine: 1.01 (ref 1.005–1.030)
pH: 7 (ref 5.0–8.0)

## 2022-03-07 LAB — CBC WITH DIFFERENTIAL/PLATELET
Abs Immature Granulocytes: 0.08 10*3/uL — ABNORMAL HIGH (ref 0.00–0.07)
Basophils Absolute: 0.1 10*3/uL (ref 0.0–0.1)
Basophils Relative: 1 %
Eosinophils Absolute: 0.1 10*3/uL (ref 0.0–0.5)
Eosinophils Relative: 1 %
HCT: 43.5 % (ref 39.0–52.0)
Hemoglobin: 14.5 g/dL (ref 13.0–17.0)
Immature Granulocytes: 1 %
Lymphocytes Relative: 6 %
Lymphs Abs: 0.8 10*3/uL (ref 0.7–4.0)
MCH: 32.2 pg (ref 26.0–34.0)
MCHC: 33.3 g/dL (ref 30.0–36.0)
MCV: 96.7 fL (ref 80.0–100.0)
Monocytes Absolute: 0.6 10*3/uL (ref 0.1–1.0)
Monocytes Relative: 5 %
Neutro Abs: 10.7 10*3/uL — ABNORMAL HIGH (ref 1.7–7.7)
Neutrophils Relative %: 86 %
Platelets: 190 10*3/uL (ref 150–400)
RBC: 4.5 MIL/uL (ref 4.22–5.81)
RDW: 12.3 % (ref 11.5–15.5)
WBC: 12.3 10*3/uL — ABNORMAL HIGH (ref 4.0–10.5)
nRBC: 0 % (ref 0.0–0.2)

## 2022-03-07 LAB — COMPREHENSIVE METABOLIC PANEL
ALT: 19 U/L (ref 0–44)
AST: 21 U/L (ref 15–41)
Albumin: 4.3 g/dL (ref 3.5–5.0)
Alkaline Phosphatase: 84 U/L (ref 38–126)
Anion gap: 10 (ref 5–15)
BUN: 16 mg/dL (ref 8–23)
CO2: 30 mmol/L (ref 22–32)
Calcium: 9.9 mg/dL (ref 8.9–10.3)
Chloride: 99 mmol/L (ref 98–111)
Creatinine, Ser: 1.55 mg/dL — ABNORMAL HIGH (ref 0.61–1.24)
GFR, Estimated: 46 mL/min — ABNORMAL LOW (ref >60)
Glucose, Bld: 144 mg/dL — ABNORMAL HIGH (ref 70–99)
Potassium: 4 mmol/L (ref 3.5–5.1)
Sodium: 139 mmol/L (ref 135–145)
Total Bilirubin: 1 mg/dL (ref 0.3–1.2)
Total Protein: 7.7 g/dL (ref 6.5–8.1)

## 2022-03-07 LAB — LIPASE, BLOOD: Lipase: 22 U/L (ref 11–51)

## 2022-03-07 MED ORDER — FLUTICASONE PROPIONATE 50 MCG/ACT NA SUSP
1.0000 | Freq: Every day | NASAL | Status: DC
Start: 2022-03-07 — End: 2022-03-08
  Administered 2022-03-07 – 2022-03-08 (×2): 1 via NASAL
  Filled 2022-03-07: qty 16

## 2022-03-07 MED ORDER — NEOMYCIN-POLYMYXIN-HC 3.5-10000-1 OT SUSP
3.0000 [drp] | Freq: Four times a day (QID) | OTIC | Status: DC
  Administered 2022-03-07 – 2022-03-08 (×4): 3 [drp] via OTIC
  Filled 2022-03-07: qty 10

## 2022-03-07 MED ORDER — POLYETHYL GLYCOL-PROPYL GLYCOL 0.4-0.3 % OP SOLN: Freq: Two times a day (BID) | OPHTHALMIC | Status: DC | PRN

## 2022-03-07 MED ORDER — DIATRIZOATE MEGLUMINE & SODIUM 66-10 % PO SOLN
90.0000 mL | Freq: Once | ORAL | Status: AC
Start: 2022-03-07 — End: 2022-03-07
  Administered 2022-03-07: 90 mL via NASOGASTRIC
  Filled 2022-03-07: qty 90

## 2022-03-07 MED ORDER — SODIUM CHLORIDE 0.9 % IV SOLN: Freq: Once | INTRAVENOUS | Status: AC

## 2022-03-07 MED ORDER — MORPHINE SULFATE (PF) 4 MG/ML IV SOLN
4.0000 mg | INTRAVENOUS | Status: DC | PRN
  Administered 2022-03-07: 4 mg via INTRAVENOUS
  Filled 2022-03-07: qty 1

## 2022-03-07 MED ORDER — ONDANSETRON HCL 4 MG/2ML IJ SOLN
4.0000 mg | Freq: Once | INTRAMUSCULAR | Status: AC
  Administered 2022-03-07: 4 mg via INTRAVENOUS
  Filled 2022-03-07: qty 2

## 2022-03-07 MED ORDER — LACTATED RINGERS IV BOLUS
1000.0000 mL | Freq: Once | INTRAVENOUS | Status: AC
  Administered 2022-03-07: 1000 mL via INTRAVENOUS

## 2022-03-07 MED ORDER — ENOXAPARIN SODIUM 30 MG/0.3ML IJ SOSY
30.0000 mg | PREFILLED_SYRINGE | INTRAMUSCULAR | Status: DC
  Administered 2022-03-07: 30 mg via SUBCUTANEOUS
  Filled 2022-03-07: qty 0.3

## 2022-03-07 MED ORDER — SODIUM CHLORIDE 0.9 % IV SOLN
1.0000 g | INTRAVENOUS | Status: DC
  Administered 2022-03-07: 1 g via INTRAVENOUS
  Filled 2022-03-07: qty 10

## 2022-03-07 MED ORDER — MORPHINE SULFATE (PF) 4 MG/ML IV SOLN
4.0000 mg | Freq: Once | INTRAVENOUS | Status: AC
  Administered 2022-03-07: 4 mg via INTRAVENOUS
  Filled 2022-03-07: qty 1

## 2022-03-07 MED ORDER — IOHEXOL 300 MG/ML  SOLN
100.0000 mL | Freq: Once | INTRAMUSCULAR | Status: AC | PRN
  Administered 2022-03-07: 100 mL via INTRAVENOUS

## 2022-03-07 MED ORDER — ALBUTEROL SULFATE (2.5 MG/3ML) 0.083% IN NEBU: 2.5000 mg | INHALATION_SOLUTION | Freq: Four times a day (QID) | RESPIRATORY_TRACT | Status: DC | PRN

## 2022-03-07 MED ORDER — ALBUTEROL SULFATE HFA 108 (90 BASE) MCG/ACT IN AERS: 2.0000 | INHALATION_SPRAY | Freq: Four times a day (QID) | RESPIRATORY_TRACT | Status: DC | PRN

## 2022-03-07 MED ORDER — POLYVINYL ALCOHOL 1.4 % OP SOLN
1.0000 [drp] | Freq: Two times a day (BID) | OPHTHALMIC | Status: DC | PRN
  Filled 2022-03-07: qty 15

## 2022-03-07 MED ORDER — ONDANSETRON HCL 4 MG/2ML IJ SOLN: 4.0000 mg | Freq: Four times a day (QID) | INTRAMUSCULAR | Status: DC | PRN

## 2022-03-07 MED ORDER — HYDRALAZINE HCL 20 MG/ML IJ SOLN: 10.0000 mg | Freq: Three times a day (TID) | INTRAMUSCULAR | Status: DC | PRN

## 2022-03-07 MED ORDER — PANTOPRAZOLE SODIUM 40 MG IV SOLR
40.0000 mg | INTRAVENOUS | Status: DC
  Administered 2022-03-07: 40 mg via INTRAVENOUS
  Filled 2022-03-07: qty 10

## 2022-03-07 NOTE — Progress Notes (Signed)
Transferring facility: [facility] Requesting provider: Dr. Roxanne Mins (EDP at [facility]) Reason for transfer: admission for further evaluation and management of small bowel obstruction.   76 year old male who presented to [place] ED on 03/07/2022 complaining of 2 days of abdominal pain associated with nausea and multiple episodes of nonbloody, nonbilious emesis as well as no bowel movement over the last 2 days and diminished flatus production.  He notes very little oral intake over the last 2 days in the setting of his abdominal pain and nausea/vomiting.  Denies any associated fever, chest pain, shortness of breath.  Vital signs in the ED were reportedly stable, with the patient noted to be afebrile, normotensive.   Labs were notable for CBC which demonstrated white blood cell count 12,300 with 86% neutrophils.  Serum creatinine noted to be 1.55, up from 1.47 a couple days ago and relative to reported baseline range of 1.2-1.25.  Imaging notable for CT abdomen/pelvis demonstrated evidence of small bowel obstruction, with report of 2 transition points.  EDP discussed the patient's case with the on-call general surgeon at [facility], Dr. Malachi Paradise, Who recommended admission to the hospitalist service at [facility], where Dr. Malachi Paradise will formally consult and see the patient.  Of note, NGT has not been placed at this time.   Medications administered prior to transfer included the following: Morphine 4 mg IV x1, Zofran 4 mg IV x1, lactated Ringer's x1 L bolus.  Subsequently, I accepted this patient for transfer for inpatient admission to a med telemetry bed at [facility] for further work-up and management of presenting small bowel obstruction.      Check [email or web address] for on-call coverage.   Nursing staff, Please call [place] number on Amion as soon as patient's arrival, so appropriate admitting provider can evaluate the pt.     Babs Bertin, DO Hospitalist

## 2022-03-07 NOTE — Telephone Encounter (Signed)
Pt seen in ED today. Dx small bowl obstruction. Pt has been admitted.

## 2022-03-07 NOTE — ED Notes (Signed)
Attached NG tube to low intermittent wall suction

## 2022-03-07 NOTE — ED Notes (Signed)
Patient transported to CT 

## 2022-03-07 NOTE — ED Provider Notes (Signed)
[facility] EMERGENCY DEPARTMENT Provider Note   CSN: [id number] Arrival date & time: 03/07/22  0350     History  Chief Complaint  Patient presents with   Abdominal Pain   Dizziness    Paul Drake is a 76 y.o. male.  The history is provided by the patient.  Abdominal Pain Dizziness He has history of hypertension, hyperlipidemia, vertigo, stroke and comes in because of left mid and lower abdominal pain over the last 2 days.  Pain is constant and severe.  Pain has occasionally radiated to the back, but not to the chest or shoulders.  There has been associated nausea and vomiting.  Pain does improve momentarily after emesis.  He denies having any bowel movements or passing flatus during this time.  He did try taking polyethylene glycol this afternoon without any benefit.  He denies fever, chills, sweats.  Denies any urinary difficulty.  Also, he has been having problems with dizziness for the last 5 days.  He was seen in the emergency department and CT was reported to be normal.  He was sent to [facility] for an MRI scan but left because it was taking too long to get the scan done.  He had a virtual visit with his primary care provider who prescribed meclizine and dizziness has improved with that.  He has noted that his dizziness is worse when he lays his head all the way back.   Home Medications Prior to Admission medications   Medication Sig Start Date End Date Taking? Authorizing Provider  albuterol (VENTOLIN HFA) 108 (90 Base) MCG/ACT inhaler Inhale 2 puffs into the lungs every 6 (six) hours as needed for wheezing or shortness of breath. 11/14/21   Shelda Pal, DO  amoxicillin-clavulanate (AUGMENTIN) 875-125 MG tablet Take 1 tablet by mouth 2 (two) times daily. 03/05/22   Mar Daring, PA-C  carbidopa-levodopa (SINEMET IR) 25-100 MG tablet 2 at 7am, 1 at 10:30, 1 at 2 pm, 1 at 5:30 pm 02/13/22   Tat, Eustace Quail, DO  clonazePAM (KLONOPIN) 0.5 MG  tablet Take 1 tablet (0.5 mg total) by mouth daily. 02/26/22   Tat, Eustace Quail, DO  clopidogrel (PLAVIX) 75 MG tablet Take 75 mg by mouth daily. On this medication since 2020 after stroke    [provider]  COVID-19 mRNA bivalent vaccine, Pfizer, (PFIZER COVID-19 VAC BIVALENT) injection Inject into the muscle. 02/06/22   Carlyle Basques, MD  finasteride (PROSCAR) 5 MG tablet Take 5 mg by mouth daily. 07/15/20   [provider]  fluticasone (FLONASE) 50 MCG/ACT nasal spray SHAKE LIQUID AND USE 2 SPRAYS IN [facility] NOSTRIL DAILY Patient not taking: Reported on 02/19/2022 03/16/21   Mosie Lukes, MD  gabapentin (NEURONTIN) 100 MG capsule TAKE 1-2 CAPSULES BY MOUTH EVERY 4 HOURS 11/14/21   Wendling, Crosby Oyster, DO  levocetirizine (XYZAL) 5 MG tablet TAKE 1 TABLET(5 MG) BY MOUTH EVERY EVENING 07/03/21   Wendling, Crosby Oyster, DO  lidocaine (LIDODERM) 5 % Place 1 patch onto the skin daily as needed. Remove & Discard patch within 12 hours or as directed by MD 11/26/21   Jeanell Sparrow, DO  meclizine (ANTIVERT) 25 MG tablet Take 1 tablet (25 mg total) by mouth 3 (three) times daily as needed for dizziness. 03/05/22   Mar Daring, PA-C  Menthol, Topical Analgesic, (BIOFREEZE EX) Apply 1 application topically daily as needed (hand pain).    [provider]  Multiple Vitamins-Minerals (MULTIVITAMINS THER. W/MINERALS) TABS tablet  Take 1 tablet by mouth daily.    [provider]  neomycin-polymyxin-hydrocortisone (CORTISPORIN) OTIC solution Place 3 drops into both ears 4 (four) times daily. X 5 days 03/05/22   Mar Daring, PA-C  Omega-3 Fatty Acids (OMEGA-3 PO) Take 350 mg by mouth daily.    [provider]  oxyCODONE (ROXICODONE) 5 MG immediate release tablet Take 1 tablet (5 mg total) by mouth every 4 (four) hours as needed for severe pain. 11/26/21   Wynona Dove A, DO  pantoprazole (PROTONIX) 40 MG tablet TAKE 1 TABLET(40 MG) BY MOUTH DAILY 06/01/21    Mosie Lukes, MD  simvastatin (ZOCOR) 20 MG tablet TAKE 1 TABLET(20 MG) BY MOUTH DAILY 10/23/21   Mosie Lukes, MD  Zoster Vaccine Adjuvanted [facility]) injection Inject into the muscle. 09/13/21   Carlyle Basques, MD      Allergies    Molds & smuts and Pollen extract    Review of Systems   Review of Systems  Gastrointestinal:  Positive for abdominal pain.  Neurological:  Positive for dizziness.  All other systems reviewed and are negative.   Physical Exam Updated Vital Signs BP (!) 147/87 (BP Location: Right Arm)   Pulse 83   Temp 97.7 F (36.5 C) (Oral)   Resp 16   Ht 5' 7.5" (1.715 m)   Wt 76.2 kg   SpO2 98%   BMI 25.92 kg/m  Physical Exam Vitals and nursing note reviewed.  76 year old male, resting comfortably and in no acute distress. Vital signs are significant for mildly elevated blood pressure. Oxygen saturation is 98%, which is normal. Head is normocephalic and atraumatic. PERRLA, EOMI. Oropharynx is clear. Neck is nontender and supple without adenopathy or JVD. Back is nontender and there is no CVA tenderness. Lungs are clear without rales, wheezes, or rhonchi. Chest is nontender. Heart has regular rate and rhythm without murmur. Abdomen is soft, flat, with diffuse tenderness which was worst in the right mid and right lower abdomen, even though he actually complains of pain being on his left side.  There is no guarding, but there is +/- rebound tenderness.  Peristalsis is hypoactive. Extremities have no cyanosis or edema, full range of motion is present. Skin is warm and dry without rash. Neurologic: Mental status is normal, cranial nerves are intact, there are no motor or sensory deficits.  Dizziness is reproduced by passive head movement.  ED Results / Procedures / Treatments   Labs (all labs ordered are listed, but only abnormal results are displayed) Labs Reviewed  COMPREHENSIVE METABOLIC PANEL  CBC WITH DIFFERENTIAL/PLATELET  URINALYSIS, ROUTINE W  REFLEX MICROSCOPIC  LIPASE, BLOOD    EKG EKG Interpretation  Date/Time:  Wednesday March 07 2022 04:17:54 EDT Ventricular Rate:  76 PR Interval:  150 QRS Duration: 89 QT Interval:  378 QTC Calculation: 425 R Axis:   51 Text Interpretation: Sinus rhythm Normal ECG When compared with ECG of 03/04/2022, Premature atrial complexes are no longer present Confirmed by Delora Fuel ([id number]) on 03/07/2022 4:20:21 AM  Radiology CT ABDOMEN PELVIS W CONTRAST  Result Date: 03/07/2022 CLINICAL DATA:  Constipation, abdominal pain, and vomiting. Bowel obstruction suspected. EXAM: CT ABDOMEN AND PELVIS WITH CONTRAST TECHNIQUE: Multidetector CT imaging of the abdomen and pelvis was performed using the standard protocol following bolus administration of intravenous contrast. RADIATION DOSE REDUCTION: This exam was performed according to the departmental dose-optimization program which includes automated exposure control, adjustment of the mA and/or kV according to patient size and/or use  of iterative reconstruction technique. CONTRAST:  150m OMNIPAQUE IOHEXOL 300 MG/ML  SOLN COMPARISON:  07/31/2021. FINDINGS: Lower chest: Mild atelectasis is present at the lung bases. Hepatobiliary: No focal liver abnormality. Fatty infiltration of the liver is noted. No biliary ductal dilatation. The gallbladder is without stones. Pancreas: Unremarkable. No pancreatic ductal dilatation or surrounding inflammatory changes. Spleen: Normal in size without focal abnormality. Adrenals/Urinary Tract: No adrenal nodule or mass. Renal calculi are noted bilaterally. There is no hydronephrosis. A cyst is noted in the mid right kidney. The bladder is unremarkable. Stomach/Bowel: The stomach is distended with fluid. There are mildly distended loops of fluid-filled small bowel in the abdomen measuring up to 3.0 cm. No significant bowel wall thickening. There is mild hyperemia with free fluid in the mesenteric folds adjacent to the loops. Two  transition points are identified in the small bowel, best seen on coronal image 68. The appendix is unremarkable. No free air or pneumatosis. A moderate amount of retained stool is present in the colon. Vascular/Lymphatic: Aortic atherosclerosis. No enlarged abdominal or pelvic lymph nodes. Reproductive: Prostate is unremarkable. Other: Small fat containing umbilical hernia. Small amount of free fluid in the mesenteric folds and pelvis. Musculoskeletal: Degenerative changes are present in the thoracolumbar spine. No acute osseous abnormality. IMPRESSION: 1. Mildly distended loops of small bowel in the abdomen and pelvis with 2 transition points common best seen on coronal image 68. A small amount of free fluid is noted in the mesenteric folds in this region. Findings may represent partial or early small bowel obstruction. Internal hernia should be included in the differential diagnosis. Surgical consultation is recommended. 2. Moderate amount of retained stool in the colon suggesting constipation. 3. Hepatic steatosis. 4. Bilateral nephrolithiasis. 5. Aortic atherosclerosis. Critical findings were reported to Dr. GRoxanne Minsat 51:61a.m. Electronically Signed   By: LBrett FairyM.D.   On: 03/07/2022 05:07    Procedures Procedures  Cardiac monitor shows normal sinus rhythm, per my interpretation.  Medications Ordered in ED Medications  ondansetron (ZOFRAN) injection 4 mg (has no administration in time range)  morphine (PF) 4 MG/ML injection 4 mg (has no administration in time range)  iohexol (OMNIPAQUE) 300 MG/ML solution 100 mL (has no administration in time range)    ED Course/ Medical Decision Making/ A&P                           Medical Decision Making Amount and/or Complexity of Data Reviewed Labs: ordered. Radiology: ordered.  Risk Prescription drug management.   Abdominal pain of uncertain cause.  This is a condition which has a high potential for morbidity and complications.  Differential  diagnosis includes, but is not limited to, appendicitis, diverticulitis, small bowel obstruction, abdominal aortic aneurysm, urinary tract infection, urolithiasis.  Old records are reviewed, and CT scan of abdomen and pelvis on 06/18/2020 showed no abdominal aneurysm.  Although report did not mention this, I have reviewed the images and do note presence of diverticulosis.  At ED visit on 03/04/2022, his CT of the head showed chronic microvascular changes but no acute process.  Labs at that visit did show renal insufficiency with creatinine of 1.47 which was new compared with creatinine of 1.26 on 10/23/2021.  I have ordered laboratory work-up of CBC, comprehensive metabolic panel, lipase, urinalysis and have ordered an ECG and CT of abdomen and pelvis with contrast.  I have ordered morphine for pain, and ondansetron for nausea.  I have reviewed and  interpreted the ECG, and it is normal.  Dizziness does seem to be peripheral vertigo, no findings to suggest central vertigo and no evidence of hypotension.  I have reviewed and interpreted all of his laboratory tests, and my interpretation is slightly worsened renal insufficiency possibly secondary to dehydration even though BUN is normal.  Mild leukocytosis with left shift, which is new compared with 03/04/2022.  CT of abdomen and pelvis shows dilated small bowel loops representing early small bowel obstruction with concern for internal hernia and recommendation for surgical consultation.  I have independently viewed the images, and agree with the radiologist interpretation.  I have also independently discussed the findings with the radiologist.  I have ordered IV fluids for him.  He does state that pain and nausea are improved following morphine and ondansetron.  Case is discussed with Dr. Brantley Stage of general surgery service.  He states he has reviewed the CT scan and does not feel that this is a primary surgical case and requests admission to hospitalist service and they  will be happy to see the patient in consultation.  Case is discussed with Dr. Velia Meyer of [facility], who agrees to admit the patient.  Final Clinical Impression(s) / ED Diagnoses Final diagnoses:  Small bowel obstruction ([place])  Acute kidney injury (nontraumatic) ([facility])  Peripheral vertigo, unspecified laterality    Rx / DC Orders ED Discharge Orders     None         Delora Fuel, MD 82/70/78 [phone]

## 2022-03-07 NOTE — H&P (Addendum)
History and Physical    Patient: Paul Drake QJJ:[id number] DOB: [birth year] DOA: 03/07/2022 DOS: the patient was seen and examined on 03/07/2022 PCP: Mosie Lukes, MD  Patient coming from: Home  Chief Complaint:  Chief Complaint  Patient presents with   Abdominal Pain   Dizziness   HPI: Paul Drake is a 76 y.o. male with medical history significant of HTN, HLD, IBS, GERD, Parkinson's disease. Presenting with LLQ abdominal pain. Symptoms started 2 days ago. It was sharp and stabbing. Initially it was just in the LLQ, but eventually radiated to the whole stomach. He feels constipated. Hasn't had a bowel movement in 5 days. He tried miralax, vut it didn't seem to help. He has not had any fever. He had N/V this morning. When his symptoms worsened this morning, he decided to come to the ED for help. He denies any other aggravating or alleviating factors.   Review of Systems: As mentioned in the history of present illness. All other systems reviewed and are negative. Past Medical History:  Diagnosis Date   Allergic state 12/29/2013   Arthritis    Barrett's esophagus    BPH (benign prostatic hyperplasia)    Chicken pox    CVA (cerebral infarction)    Depression    GERD (gastroesophageal reflux disease)    History of kidney stones    HTN (hypertension), benign 04/01/2015   Hyperglycemia 01/06/2015   Hyperlipemia    Hypertension    IBS (irritable bowel syndrome)    Left hand pain 07/21/2017   Measles as a child   Medicare annual wellness visit, subsequent 06/26/2015   Mumps as a child   Otitis externa 12/10/2013   Seasonal allergies    some asthma   Stroke [facility])    Unspecified asthma(493.90) 12/29/2013   Past Surgical History:  Procedure Laterality Date   COLONOSCOPY     EP IMPLANTABLE DEVICE N/A 10/16/2016   Procedure: Loop Recorder Insertion;  Surgeon: Thompson Grayer, MD;  Location: [place] CV LAB;  Service: Cardiovascular;  Laterality: N/A;   GANGLION CYST EXCISION Left  06/08/2021   Procedure: LEFT VOLAR GANGLION EXCISION;  Surgeon: Sherilyn Cooter, MD;  Location: [place];  Service: Orthopedics;  Laterality: Left;   implantable loop recorder removal  09/21/2020   Reveal LINQ removed in office   LITHOTRIPSY     multiple times   PROSTATE SURGERY  08/2017   TEE WITHOUT CARDIOVERSION N/A 09/22/2014   Procedure: TRANSESOPHAGEAL ECHOCARDIOGRAM (TEE);  Surgeon: Josue Hector, MD;  Location: [facility] ENDOSCOPY;  Service: Cardiovascular;  Laterality: N/A;   WISDOM TOOTH EXTRACTION     Social History:  reports that he has never smoked. He has never used smokeless tobacco. He reports that he does not currently use alcohol after a past usage of about 1.0 standard drink of alcohol per week. He reports that he does not use drugs.  Allergies  Allergen Reactions   Molds & Smuts Other (See Comments)    Runny nose Itchy eyes    Pollen Extract Other (See Comments)    Runny nose Itchy  eyes      Family History  Problem Relation Age of Onset   Hypertension Mother    Hyperlipidemia Mother    Fibromyalgia Mother    Arthritis Mother        rheumatoid   Diabetes Sister        type 2   Hyperlipidemia Brother    Hypertension Brother    Prostate cancer Brother  Ulcers Father 19       Bleeding Ulcers   Kidney Stones Daughter    Asthma Daughter    Healthy Son    Cancer Maternal Grandfather        skin ?   Stroke Maternal Aunt    Cancer Maternal Uncle        prostate    Prior to Admission medications   Medication Sig Start Date End Date Taking? Authorizing Provider  acetaminophen (TYLENOL) 500 MG tablet Take 1,000 mg by mouth daily as needed for mild pain or headache.   Yes [provider]  albuterol (VENTOLIN HFA) 108 (90 Base) MCG/ACT inhaler Inhale 2 puffs into the lungs every 6 (six) hours as needed for wheezing or shortness of breath. 11/14/21  Yes Shelda Pal, DO  alfuzosin (UROXATRAL) 10 MG 24 hr tablet Take 10 mg by  mouth daily. 11/20/21  Yes [provider]  amoxicillin-clavulanate (AUGMENTIN) 875-125 MG tablet Take 1 tablet by mouth 2 (two) times daily. Patient taking differently: Take 1 tablet by mouth 2 (two) times daily. 7 day course. Pt has taken 3 tablets. 03/05/22  Yes Burnette, Clearnce Sorrel, PA-C  Bioflavonoid Products (VITAMIN C) CHEW Chew 1 each by mouth daily.   Yes [provider]  carbidopa-levodopa (SINEMET IR) 25-100 MG tablet 2 at 7am, 1 at 10:30, 1 at 2 pm, 1 at 5:30 pm Patient taking differently: Take 1-2 tablets by mouth See admin instructions. 2 tablets at 07:00, 1 tablet at 10:30, 1 tablet at 14:00, 1 tablet at 17:30 02/13/22  Yes Tat, Eustace Quail, DO  cetirizine (ZYRTEC) 10 MG tablet Take 10 mg by mouth daily.   Yes [provider]  clonazePAM (KLONOPIN) 0.5 MG tablet Take 1 tablet (0.5 mg total) by mouth daily. Patient taking differently: Take 0.5 mg by mouth daily as needed for anxiety. 02/26/22  Yes Tat, Eustace Quail, DO  clopidogrel (PLAVIX) 75 MG tablet Take 75 mg by mouth daily. On this medication since 2020 after stroke   Yes [provider]  finasteride (PROSCAR) 5 MG tablet Take 5 mg by mouth daily. 07/15/20  Yes [provider]  fluticasone (FLONASE) 50 MCG/ACT nasal spray Place 1 spray into both nostrils daily.   Yes [provider]  gabapentin (NEURONTIN) 100 MG capsule TAKE 1-2 CAPSULES BY MOUTH EVERY 4 HOURS Patient taking differently: Take 100 mg by mouth every 4 (four) hours as needed (hand pain). 11/14/21  Yes Shelda Pal, DO  Lidocaine 4 % SOLN Apply 1 Application topically daily as needed (hand pain).   Yes [provider]  meclizine (ANTIVERT) 25 MG tablet Take 1 tablet (25 mg total) by mouth 3 (three) times daily as needed for dizziness. 03/05/22  Yes Mar Daring, PA-C  Menthol, Topical Analgesic, (BIOFREEZE EX) Apply 1 application topically daily as needed (hand pain).   Yes [provider]   Multiple Vitamins-Minerals (MULTIVITAMINS THER. W/MINERALS) TABS tablet Take 1 tablet by mouth daily.   Yes [provider]  Multiple Vitamins-Minerals (ZINC PO) Take 1 tablet by mouth daily.   Yes [provider]  Naphazoline-Pheniramine (VISINE-A OP) Place 1 drop into both eyes 2 (two) times daily as needed (itchy eyes).   Yes [provider]  neomycin-polymyxin-hydrocortisone (CORTISPORIN) OTIC solution Place 3 drops into both ears 4 (four) times daily. X 5 days Patient taking differently: Place 3 drops into both ears 4 (four) times daily. 5 day course. Pt is on day 2. 03/05/22  Yes  Fenton Malling M, PA-C  Omega-3 Fatty Acids (OMEGA-3 PO) Take 350 mg by mouth daily.   Yes [provider]  oxyCODONE (ROXICODONE) 5 MG immediate release tablet Take 1 tablet (5 mg total) by mouth every 4 (four) hours as needed for severe pain. 11/26/21  Yes Wynona Dove A, DO  pantoprazole (PROTONIX) 40 MG tablet TAKE 1 TABLET(40 MG) BY MOUTH DAILY Patient taking differently: Take 40 mg by mouth daily. 06/01/21  Yes Mosie Lukes, MD  Polyethyl Glycol-Propyl Glycol (SYSTANE ULTRA OP) Place 1 drop into both eyes 2 (two) times daily as needed (dry eyes).   Yes [provider]  Probiotic Product (PROBIOTIC PO) Take 1 capsule by mouth daily.   Yes [provider]  simvastatin (ZOCOR) 20 MG tablet TAKE 1 TABLET(20 MG) BY MOUTH DAILY Patient taking differently: Take 20 mg by mouth daily. 10/23/21  Yes Mosie Lukes, MD  vitamin B-12 (CYANOCOBALAMIN) 1000 MCG tablet Take 1,000 mcg by mouth daily.   Yes [provider]  lidocaine (LIDODERM) 5 % Place 1 patch onto the skin daily as needed. Remove & Discard patch within 12 hours or as directed by MD Patient not taking: Reported on 03/07/2022 11/26/21   Jeanell Sparrow, DO    Physical Exam: Vitals:   03/07/22 1100 03/07/22 1159 03/07/22 1200 03/07/22 1333  BP: (!) 147/82  (!) 141/86 134/86  Pulse: 68  72 77   Resp: '16  16 16  '$ Temp:  97.6 F (36.4 C)  98 F (36.7 C)  TempSrc:  Oral  Oral  SpO2: 96%  95% 96%  Weight:      Height:       General: 76 y.o. male resting in bed in NAD Eyes: PERRL, normal sclera ENMT: Nares patent w/o discharge, orophaynx clear, dentition normal, ears w/o discharge/lesions/ulcers Neck: Supple, trachea midline Cardiovascular: RRR, +S1, S2, no m/g/r, equal pulses throughout Respiratory: CTABL, no w/r/r, normal WOB GI: BS+, NDNT, no masses noted, no organomegaly noted MSK: No e/c/c Neuro: A&O x 3, no focal deficits Psyc: Appropriate interaction and affect, calm/cooperative  Data Reviewed:  Na+  139 K+  4.0 Glucose 144 BUN  16 Scr  1.55 WBC  12.3 Hgb  14.5 Plt  190  CT ab/pelvis w/ contrast 1. Mildly distended loops of small bowel in the abdomen and pelvis with 2 transition points common best seen on coronal image 68. A small amount of free fluid is noted in the mesenteric folds in this region. Findings may represent partial or early small bowel obstruction. Internal hernia should be included in the differential diagnosis. Surgical consultation is recommended. 2. Moderate amount of retained stool in the colon suggesting constipation. 3. Hepatic steatosis. 4. Bilateral nephrolithiasis. 5. Aortic atherosclerosis.  Assessment and Plan: SBO     - admitted to inpt, tele     - SBO protocol started     - Gen surg consulted, appreciate assistance     - NPO for now  HTN     - resume home regimen when he can come off NPO status     - PRN anti-hypertensives for now  HLD     - resume home regimen when he can come off NPO status  Parkinson's disease     - resume home regimen when he can come off NPO status  AKI     - fluids, watch nephrotoxins, check renal US  Dizziness Recent acute b/l suppurative otitis media     - started on augmentin  2 days ago; only taken 3 doses     - start rocephin     - continue cortisporin ear drops     - dizziness  has improved since starting abx  BPH     - resume home regimen when he can come off NPO status  GERD     - protonix  Advance Care Planning:   Code Status: FULL  Consults: General Surgery  Family Communication: w/ wife at bedside  Severity of Illness: The appropriate patient status for this patient is INPATIENT. Inpatient status is judged to be reasonable and necessary in order to provide the required intensity of service to ensure the patient's safety. The patient's presenting symptoms, physical exam findings, and initial radiographic and laboratory data in the context of their chronic comorbidities is felt to place them at high risk for further clinical deterioration. Furthermore, it is not anticipated that the patient will be medically stable for discharge from the hospital within 2 midnights of admission.   * I certify that at the point of admission it is my clinical judgment that the patient will require inpatient hospital care spanning beyond 2 midnights from the point of admission due to high intensity of service, high risk for further deterioration and high frequency of surveillance required.*  Author: Jonnie Finner, DO 03/07/2022 3:35 PM  For on call review [email or web address].

## 2022-03-07 NOTE — Consult Note (Signed)
Reason for Consult:sbo Referring Physician: dr Paul Drake is an 76 y.o. male.  HPI: 28 yom with no prior ab surgery who has over 24 hours of ab pain, nausea and emesis.  No bm in about five days. He has passed flatus and has recently.  No fevers. Tried miralax but this did not help.  Nothing else was helping and presented to the ER.  He underwent a ct scan that shows likely sbo.  He was transferred from outside ER to here. He is sitting comfortably in bed, no real pain, ng in place  Past Medical History:  Diagnosis Date   Allergic state 12/29/2013   Arthritis    Barrett's esophagus    BPH (benign prostatic hyperplasia)    Chicken pox    CVA (cerebral infarction)    Depression    GERD (gastroesophageal reflux disease)    History of kidney stones    HTN (hypertension), benign 04/01/2015   Hyperglycemia 01/06/2015   Hyperlipemia    Hypertension    IBS (irritable bowel syndrome)    Left hand pain 07/21/2017   Measles as a child   Medicare annual wellness visit, subsequent 06/26/2015   Mumps as a child   Otitis externa 12/10/2013   Seasonal allergies    some asthma   Stroke [facility])    Unspecified asthma(493.90) 12/29/2013    Past Surgical History:  Procedure Laterality Date   COLONOSCOPY     EP IMPLANTABLE DEVICE N/A 10/16/2016   Procedure: Loop Recorder Insertion;  Surgeon: Thompson Grayer, MD;  Location: [place] [facility];  Service: Cardiovascular;  Laterality: N/A;   GANGLION CYST EXCISION Left 06/08/2021   Procedure: LEFT VOLAR GANGLION EXCISION;  Surgeon: Sherilyn Cooter, MD;  Location: [place];  Service: Orthopedics;  Laterality: Left;   implantable loop recorder removal  09/21/2020   Reveal LINQ removed in office   LITHOTRIPSY     multiple times   PROSTATE SURGERY  08/2017   TEE WITHOUT CARDIOVERSION N/A 09/22/2014   Procedure: TRANSESOPHAGEAL ECHOCARDIOGRAM (TEE);  Surgeon: Josue Hector, MD;  Location: [facility];  Service: Cardiovascular;   Laterality: N/A;   WISDOM TOOTH EXTRACTION     His prostate surgery was turp  Family History  Problem Relation Age of Onset   Hypertension Mother    Hyperlipidemia Mother    Fibromyalgia Mother    Arthritis Mother        rheumatoid   Diabetes Sister        type 2   Hyperlipidemia Brother    Hypertension Brother    Prostate cancer Brother    Ulcers Father 73       Bleeding Ulcers   Kidney Stones Daughter    Asthma Daughter    Healthy Son    Cancer Maternal Grandfather        skin ?   Stroke Maternal Aunt    Cancer Maternal Uncle        prostate    Social History:  reports that he has never smoked. He has never used smokeless tobacco. He reports that he does not currently use alcohol after a past usage of about 1.0 standard drink of alcohol per week. He reports that he does not use drugs.  Allergies:  Allergies  Allergen Reactions   Molds & Smuts Other (See Comments)    Runny nose Itchy eyes    Pollen Extract Other (See Comments)    Runny nose Itchy  eyes  Medications: I have reviewed the patient's current medications. No current facility-administered medications on file prior to encounter.   Current Outpatient Medications on File Prior to Encounter  Medication Sig Dispense Refill   acetaminophen (TYLENOL) 500 MG tablet Take 1,000 mg by mouth daily as needed for mild pain or headache.     albuterol (VENTOLIN HFA) 108 (90 Base) MCG/ACT inhaler Inhale 2 puffs into the lungs every 6 (six) hours as needed for wheezing or shortness of breath. 1 each 3   alfuzosin (UROXATRAL) 10 MG 24 hr tablet Take 10 mg by mouth daily.     amoxicillin-clavulanate (AUGMENTIN) 875-125 MG tablet Take 1 tablet by mouth 2 (two) times daily. (Patient taking differently: Take 1 tablet by mouth 2 (two) times daily. 7 day course. Pt has taken 3 tablets.) 14 tablet 0   Bioflavonoid Products (VITAMIN C) CHEW Chew 1 each by mouth daily.     carbidopa-levodopa (SINEMET IR) 25-100 MG tablet 2 at  7am, 1 at 10:30, 1 at 2 pm, 1 at 5:30 pm (Patient taking differently: Take 1-2 tablets by mouth See admin instructions. 2 tablets at 07:00, 1 tablet at 10:30, 1 tablet at 14:00, 1 tablet at 17:30) 450 tablet 1   cetirizine (ZYRTEC) 10 MG tablet Take 10 mg by mouth daily.     clonazePAM (KLONOPIN) 0.5 MG tablet Take 1 tablet (0.5 mg total) by mouth daily. (Patient taking differently: Take 0.5 mg by mouth daily as needed for anxiety.) 30 tablet 4   clopidogrel (PLAVIX) 75 MG tablet Take 75 mg by mouth daily. On this medication since 2020 after stroke     finasteride (PROSCAR) 5 MG tablet Take 5 mg by mouth daily.     fluticasone (FLONASE) 50 MCG/ACT nasal spray Place 1 spray into both nostrils daily.     gabapentin (NEURONTIN) 100 MG capsule TAKE 1-2 CAPSULES BY MOUTH EVERY 4 HOURS (Patient taking differently: Take 100 mg by mouth every 4 (four) hours as needed (hand pain).) 540 capsule 1   Lidocaine 4 % SOLN Apply 1 Application topically daily as needed (hand pain).     meclizine (ANTIVERT) 25 MG tablet Take 1 tablet (25 mg total) by mouth 3 (three) times daily as needed for dizziness. 30 tablet 0   Menthol, Topical Analgesic, (BIOFREEZE EX) Apply 1 application topically daily as needed (hand pain).     Multiple Vitamins-Minerals (MULTIVITAMINS THER. W/MINERALS) TABS tablet Take 1 tablet by mouth daily.     Multiple Vitamins-Minerals (ZINC PO) Take 1 tablet by mouth daily.     Naphazoline-Pheniramine (VISINE-A OP) Place 1 drop into both eyes 2 (two) times daily as needed (itchy eyes).     neomycin-polymyxin-hydrocortisone (CORTISPORIN) OTIC solution Place 3 drops into both ears 4 (four) times daily. X 5 days (Patient taking differently: Place 3 drops into both ears 4 (four) times daily. 5 day course. Pt is on day 2.) 10 mL 0   Omega-3 Fatty Acids (OMEGA-3 PO) Take 350 mg by mouth daily.     oxyCODONE (ROXICODONE) 5 MG immediate release tablet Take 1 tablet (5 mg total) by mouth every 4 (four) hours as  needed for severe pain. 5 tablet 0   pantoprazole (PROTONIX) 40 MG tablet TAKE 1 TABLET(40 MG) BY MOUTH DAILY (Patient taking differently: Take 40 mg by mouth daily.) 90 tablet 3   Polyethyl Glycol-Propyl Glycol (SYSTANE ULTRA OP) Place 1 drop into both eyes 2 (two) times daily as needed (dry eyes).     Probiotic Product (PROBIOTIC  PO) Take 1 capsule by mouth daily.     simvastatin (ZOCOR) 20 MG tablet TAKE 1 TABLET(20 MG) BY MOUTH DAILY (Patient taking differently: Take 20 mg by mouth daily.) 90 tablet 1   vitamin B-12 (CYANOCOBALAMIN) 1000 MCG tablet Take 1,000 mcg by mouth daily.     lidocaine (LIDODERM) 5 % Place 1 patch onto the skin daily as needed. Remove & Discard patch within 12 hours or as directed by MD (Patient not taking: Reported on 03/07/2022) 15 patch 0    Results for orders placed or performed during the hospital encounter of 03/07/22 (from the past 48 hour(s))  Comprehensive metabolic panel     Status: Abnormal   Collection Time: 03/07/22  4:00 AM  Result Value Ref Range   Sodium 139 135 - 145 mmol/L   Potassium 4.0 3.5 - 5.1 mmol/L   Chloride 99 98 - 111 mmol/L   CO2 30 22 - 32 mmol/L   Glucose, Bld 144 (H) 70 - 99 mg/dL    Comment: Glucose reference range applies only to samples taken after fasting for at least 8 hours.   BUN 16 8 - 23 mg/dL   Creatinine, Ser 1.55 (H) 0.61 - 1.24 mg/dL   Calcium 9.9 8.9 - 10.3 mg/dL   Total Protein 7.7 6.5 - 8.1 g/dL   Albumin 4.3 3.5 - 5.0 g/dL   AST 21 15 - 41 U/L   ALT 19 0 - 44 U/L   Alkaline Phosphatase 84 38 - 126 U/L   Total Bilirubin 1.0 0.3 - 1.2 mg/dL   GFR, Estimated 46 (L) >60 mL/min    Comment: (NOTE) Calculated using the CKD-EPI Creatinine Equation (2021)    Anion gap 10 5 - 15    Comment: Performed at [facility], [address] [place], [place] [address]  CBC with Differential     Status: Abnormal   Collection Time: 03/07/22  4:00 AM  Result Value Ref Range   WBC 12.3 (H) 4.0 - 10.5 K/uL   RBC 4.50  4.22 - 5.81 MIL/uL   Hemoglobin 14.5 13.0 - 17.0 g/dL   HCT 43.5 39.0 - 52.0 %   MCV 96.7 80.0 - 100.0 fL   MCH 32.2 26.0 - 34.0 pg   MCHC 33.3 30.0 - 36.0 g/dL   RDW 12.3 11.5 - 15.5 %   Platelets 190 150 - 400 K/uL   nRBC 0.0 0.0 - 0.2 %   Neutrophils Relative % 86 %   Neutro Abs 10.7 (H) 1.7 - 7.7 K/uL   Lymphocytes Relative 6 %   Lymphs Abs 0.8 0.7 - 4.0 K/uL   Monocytes Relative 5 %   Monocytes Absolute 0.6 0.1 - 1.0 K/uL   Eosinophils Relative 1 %   Eosinophils Absolute 0.1 0.0 - 0.5 K/uL   Basophils Relative 1 %   Basophils Absolute 0.1 0.0 - 0.1 K/uL   Immature Granulocytes 1 %   Abs Immature Granulocytes 0.08 (H) 0.00 - 0.07 K/uL    Comment: Performed at [facility], [address] [place], [place] [address]  Lipase, blood     Status: None   Collection Time: 03/07/22  4:00 AM  Result Value Ref Range   Lipase 22 11 - 51 U/L    Comment: Performed at [facility], [address] [place], [place] [address]  Urinalysis, Routine w reflex microscopic     Status: Abnormal   Collection Time: 03/07/22  6:45 AM  Result Value Ref Range   Color, Urine YELLOW YELLOW   APPearance HAZY (A) CLEAR   Specific Gravity, Urine 1.010 1.005 - 1.030   pH 7.0 5.0 - 8.0   Glucose, UA NEGATIVE NEGATIVE mg/dL   Hgb urine dipstick NEGATIVE NEGATIVE   Bilirubin Urine NEGATIVE NEGATIVE   Ketones, ur NEGATIVE NEGATIVE mg/dL   Protein, ur NEGATIVE NEGATIVE mg/dL   Nitrite NEGATIVE NEGATIVE   Leukocytes,Ua NEGATIVE NEGATIVE    Comment: Microscopic not done on urines with negative protein, blood, leukocytes, nitrite, or glucose < 500 mg/dL. Performed at [facility], [address] [place], [place] [address]     DG Abd Portable 1V-Small Bowel Protocol-Position Verification  Result Date: 03/07/2022 CLINICAL DATA:  NG tube placement for small bowel obstruction. EXAM: PORTABLE ABDOMEN - 1 VIEW COMPARISON:  CT examination dated March 07, 2022 FINDINGS: Dilated  small bowel loops measuring up to 4.1 cm concerning for obstruction/ileus. NG tube with distal tip projecting over the gastric body. Osseous structures are unremarkable. IMPRESSION: Dilated small bowel loops measuring up to 4.1 cm suggesting obstruction/ileus. NG tube with distal tip projecting over the body of the stomach. Electronically Signed   By: Keane Police D.O.   On: 03/07/2022 09:02   CT ABDOMEN PELVIS W CONTRAST  Result Date: 03/07/2022 CLINICAL DATA:  Constipation, abdominal pain, and vomiting. Bowel obstruction suspected. EXAM: CT ABDOMEN AND PELVIS WITH CONTRAST TECHNIQUE: Multidetector CT imaging of the abdomen and pelvis was performed using the standard protocol following bolus administration of intravenous contrast. RADIATION DOSE REDUCTION: This exam was performed according to the departmental dose-optimization program which includes automated exposure control, adjustment of the mA and/or kV according to patient size and/or use of iterative reconstruction technique. CONTRAST:  133m OMNIPAQUE IOHEXOL 300 MG/ML  SOLN COMPARISON:  07/31/2021. FINDINGS: Lower chest: Mild atelectasis is present at the lung bases. Hepatobiliary: No focal liver abnormality. Fatty infiltration of the liver is noted. No biliary ductal dilatation. The gallbladder is without stones. Pancreas: Unremarkable. No pancreatic ductal dilatation or surrounding inflammatory changes. Spleen: Normal in size without focal abnormality. Adrenals/Urinary Tract: No adrenal nodule or mass. Renal calculi are noted bilaterally. There is no hydronephrosis. A cyst is noted in the mid right kidney. The bladder is unremarkable. Stomach/Bowel: The stomach is distended with fluid. There are mildly distended loops of fluid-filled small bowel in the abdomen measuring up to 3.0 cm. No significant bowel wall thickening. There is mild hyperemia with free fluid in the mesenteric folds adjacent to the loops. Two transition points are identified in the  small bowel, best seen on coronal image 68. The appendix is unremarkable. No free air or pneumatosis. A moderate amount of retained stool is present in the colon. Vascular/Lymphatic: Aortic atherosclerosis. No enlarged abdominal or pelvic lymph nodes. Reproductive: Prostate is unremarkable. Other: Small fat containing umbilical hernia. Small amount of free fluid in the mesenteric folds and pelvis. Musculoskeletal: Degenerative changes are present in the thoracolumbar spine. No acute osseous abnormality. IMPRESSION: 1. Mildly distended loops of small bowel in the abdomen and pelvis with 2 transition points common best seen on coronal image 68. A small amount of free fluid is noted in the mesenteric folds in this region. Findings may represent partial or early small bowel obstruction. Internal hernia should be included in the differential diagnosis. Surgical consultation is recommended. 2. Moderate amount of retained stool in the colon suggesting constipation. 3. Hepatic steatosis. 4. Bilateral nephrolithiasis. 5. Aortic atherosclerosis. Critical findings were reported to Dr.  Glick at 0:62 a.m. Electronically Signed   By: Brett Fairy M.D.   On: 03/07/2022 05:07    Review of Systems  Gastrointestinal:  Positive for abdominal pain, nausea and vomiting.  All other systems reviewed and are negative.  Blood pressure 134/86, pulse 77, temperature 98 F (36.7 C), temperature source Oral, resp. rate 16, height 5' 7.5" (1.715 m), weight 76.2 kg, SpO2 96 %. Physical Exam Constitutional:      Appearance: He is well-developed.  HENT:     Mouth/Throat:     Mouth: Mucous membranes are moist.  Eyes:     General: No scleral icterus. Cardiovascular:     Rate and Rhythm: Normal rate and regular rhythm.  Pulmonary:     Effort: Pulmonary effort is normal.  Abdominal:     Palpations: Abdomen is soft.     Tenderness: There is no abdominal tenderness.     Hernia: No hernia is present.  Skin:    General: Skin is  warm and dry.     Capillary Refill: Capillary refill takes less than 2 seconds.  Neurological:     General: No focal deficit present.     Mental Status: He is alert.  Psychiatric:        Mood and Affect: Mood normal.        Behavior: Behavior normal.     Assessment/Plan: SBO no prior surgery -no indication for surgery now -will try protocol but if not better soon with no surgery will need surgery -remain npo -do not start plavix please -will follow up in am -discussed plan with patient and his wife  I reviewed ED provider notes, hospitalist notes, last 24 h vitals and pain scores, last 24 h labs and trends, and last 24 h imaging results.I reviewed cbc, bmet and his ct scan. Ct reviewed independently- I do not think has internal hernia.  Discussed with ER provider and decision made for medical admission and we will follow  This care required high  level of medical decision making.    Paul Drake 03/07/2022, 4:04 PM

## 2022-03-07 NOTE — ED Notes (Signed)
[facility] at bedside 

## 2022-03-07 NOTE — ED Notes (Signed)
Report given to [facility]. 

## 2022-03-07 NOTE — ED Triage Notes (Signed)
Pt was seen here on Sunday for dizziness and was ruled out for stroke and was sent to [facility] for an MRI but did not stay as the wait was too long  Pt is c/o constipation   Pt was seen by a virtual visit Monday morning and she started him on Meclazine, augmentin, and eardrops  Yesterday morning pt had abd pain with vomiting    Pt vomited about 3 hrs ago  Pt took miralax yesterday about 2pm but did not have results   Pt states his dizziness is better

## 2022-03-08 DIAGNOSIS — G20 Parkinson's disease: Secondary | ICD-10-CM

## 2022-03-08 DIAGNOSIS — G450 Vertebro-basilar artery syndrome: Secondary | ICD-10-CM | POA: Diagnosis not present

## 2022-03-08 DIAGNOSIS — Z8673 Personal history of transient ischemic attack (TIA), and cerebral infarction without residual deficits: Secondary | ICD-10-CM

## 2022-03-08 DIAGNOSIS — N179 Acute kidney failure, unspecified: Secondary | ICD-10-CM | POA: Diagnosis not present

## 2022-03-08 DIAGNOSIS — I10 Essential (primary) hypertension: Secondary | ICD-10-CM | POA: Diagnosis not present

## 2022-03-08 DIAGNOSIS — N401 Enlarged prostate with lower urinary tract symptoms: Secondary | ICD-10-CM | POA: Diagnosis not present

## 2022-03-08 DIAGNOSIS — I700 Atherosclerosis of aorta: Secondary | ICD-10-CM

## 2022-03-08 DIAGNOSIS — K5669 Other partial intestinal obstruction: Secondary | ICD-10-CM | POA: Diagnosis not present

## 2022-03-08 DIAGNOSIS — K760 Fatty (change of) liver, not elsewhere classified: Secondary | ICD-10-CM

## 2022-03-08 DIAGNOSIS — R351 Nocturia: Secondary | ICD-10-CM

## 2022-03-08 DIAGNOSIS — H6500 Acute serous otitis media, unspecified ear: Secondary | ICD-10-CM | POA: Diagnosis not present

## 2022-03-08 DIAGNOSIS — I951 Orthostatic hypotension: Secondary | ICD-10-CM | POA: Diagnosis not present

## 2022-03-08 DIAGNOSIS — F418 Other specified anxiety disorders: Secondary | ICD-10-CM | POA: Diagnosis not present

## 2022-03-08 DIAGNOSIS — K56609 Unspecified intestinal obstruction, unspecified as to partial versus complete obstruction: Secondary | ICD-10-CM | POA: Diagnosis not present

## 2022-03-08 DIAGNOSIS — R42 Dizziness and giddiness: Secondary | ICD-10-CM

## 2022-03-08 DIAGNOSIS — K219 Gastro-esophageal reflux disease without esophagitis: Secondary | ICD-10-CM

## 2022-03-08 LAB — COMPREHENSIVE METABOLIC PANEL
ALT: 7 U/L (ref 0–44)
AST: 15 U/L (ref 15–41)
Albumin: 3.5 g/dL (ref 3.5–5.0)
Alkaline Phosphatase: 69 U/L (ref 38–126)
Anion gap: 7 (ref 5–15)
BUN: 15 mg/dL (ref 8–23)
CO2: 28 mmol/L (ref 22–32)
Calcium: 9.1 mg/dL (ref 8.9–10.3)
Chloride: 109 mmol/L (ref 98–111)
Creatinine, Ser: 1.28 mg/dL — ABNORMAL HIGH (ref 0.61–1.24)
GFR, Estimated: 58 mL/min — ABNORMAL LOW (ref >60)
Glucose, Bld: 92 mg/dL (ref 70–99)
Potassium: 3.8 mmol/L (ref 3.5–5.1)
Sodium: 144 mmol/L (ref 135–145)
Total Bilirubin: 1.3 mg/dL — ABNORMAL HIGH (ref 0.3–1.2)
Total Protein: 6.4 g/dL — ABNORMAL LOW (ref 6.5–8.1)

## 2022-03-08 LAB — CBC
HCT: 38 % — ABNORMAL LOW (ref 39.0–52.0)
Hemoglobin: 12.3 g/dL — ABNORMAL LOW (ref 13.0–17.0)
MCH: 32.5 pg (ref 26.0–34.0)
MCHC: 32.4 g/dL (ref 30.0–36.0)
MCV: 100.3 fL — ABNORMAL HIGH (ref 80.0–100.0)
Platelets: 163 10*3/uL (ref 150–400)
RBC: 3.79 MIL/uL — ABNORMAL LOW (ref 4.22–5.81)
RDW: 12.4 % (ref 11.5–15.5)
WBC: 9.1 10*3/uL (ref 4.0–10.5)
nRBC: 0 % (ref 0.0–0.2)

## 2022-03-08 LAB — LACTIC ACID, PLASMA: Lactic Acid, Venous: 0.9 mmol/L (ref 0.5–1.9)

## 2022-03-08 MED ORDER — ENOXAPARIN SODIUM 40 MG/0.4ML IJ SOSY
40.0000 mg | PREFILLED_SYRINGE | INTRAMUSCULAR | Status: DC
Start: 2022-03-08 — End: 2022-03-08

## 2022-03-08 MED ORDER — POLYETHYLENE GLYCOL 3350 17 GM/SCOOP PO POWD: 17.0000 g | Freq: Two times a day (BID) | ORAL | 1 refills | Status: DC | PRN

## 2022-03-08 MED ORDER — HYDRALAZINE HCL 10 MG PO TABS: 10.0000 mg | ORAL_TABLET | Freq: Four times a day (QID) | ORAL | Status: DC | PRN

## 2022-03-08 MED ORDER — SODIUM CHLORIDE 0.9 % IV SOLN
INTRAVENOUS | Status: DC
Start: 2022-03-08 — End: 2022-03-08

## 2022-03-08 MED ORDER — CARBIDOPA-LEVODOPA 25-100 MG PO TABS
1.0000 | ORAL_TABLET | Freq: Every day | ORAL | Status: DC
Start: 2022-03-08 — End: 2022-03-08

## 2022-03-08 MED ORDER — CARBIDOPA-LEVODOPA 25-100 MG PO TABS: 1.0000 | ORAL_TABLET | Freq: Every evening | ORAL | Status: DC

## 2022-03-08 MED ORDER — FINASTERIDE 5 MG PO TABS
5.0000 mg | ORAL_TABLET | Freq: Every day | ORAL | Status: DC
  Administered 2022-03-08: 5 mg via ORAL
  Filled 2022-03-08: qty 1

## 2022-03-08 MED ORDER — SENNOSIDES-DOCUSATE SODIUM 8.6-50 MG PO TABS: 1.0000 | ORAL_TABLET | Freq: Two times a day (BID) | ORAL | Status: DC | PRN

## 2022-03-08 MED ORDER — CARBIDOPA-LEVODOPA 25-100 MG PO TABS
1.0000 | ORAL_TABLET | Freq: Every day | ORAL | Status: DC
  Administered 2022-03-08: 1 via NASOGASTRIC
  Filled 2022-03-08: qty 1

## 2022-03-08 MED ORDER — SENNOSIDES-DOCUSATE SODIUM 8.6-50 MG PO TABS: 1.0000 | ORAL_TABLET | Freq: Two times a day (BID) | ORAL | 0 refills | Status: AC | PRN

## 2022-03-08 MED ORDER — CARBIDOPA-LEVODOPA 25-100 MG PO TABS
2.0000 | ORAL_TABLET | Freq: Every day | ORAL | Status: DC
  Administered 2022-03-08: 2 via NASOGASTRIC
  Filled 2022-03-08: qty 2

## 2022-03-08 MED ORDER — ALFUZOSIN HCL ER 10 MG PO TB24
10.0000 mg | ORAL_TABLET | Freq: Every day | ORAL | Status: DC
  Filled 2022-03-08: qty 1

## 2022-03-08 MED ORDER — CARBIDOPA-LEVODOPA 25-100 MG PO TABS
1.0000 | ORAL_TABLET | Freq: Every day | ORAL | Status: DC
  Filled 2022-03-08: qty 1

## 2022-03-08 MED ORDER — SODIUM CHLORIDE 0.9 % IV SOLN
3.0000 g | Freq: Four times a day (QID) | INTRAVENOUS | Status: DC
  Administered 2022-03-08: 3 g via INTRAVENOUS
  Filled 2022-03-08 (×3): qty 8

## 2022-03-08 MED ORDER — CARBIDOPA-LEVODOPA 10-100 MG PO TABS: 2.0000 | ORAL_TABLET | Freq: Every morning | ORAL | Status: DC

## 2022-03-08 MED ORDER — MECLIZINE HCL 25 MG PO TABS
12.5000 mg | ORAL_TABLET | Freq: Three times a day (TID) | ORAL | Status: DC | PRN
Start: 2022-03-08 — End: 2022-03-08
  Administered 2022-03-08: 12.5 mg via ORAL
  Filled 2022-03-08: qty 1

## 2022-03-08 NOTE — Progress Notes (Signed)
PROGRESS NOTE  Paul Drake HKV:[id number] DOB: 10/01/1945   PCP: Mosie Lukes, MD  Patient is from: Home.  Lives with family.  Independently ambulates at baseline.  DOA: 03/07/2022 LOS: 1  Chief complaints Chief Complaint  Patient presents with   Abdominal Pain   Dizziness     Brief Narrative / Interim history: 76 year old M with PMH of Parkinson disease, CVA, vertebrobasilar artery syndrome, anxiety, depression, BPH, GERD, HTN and recent diagnosis of otitis media for which she was started on Augmentin presenting with sharp and stabbing LLQ pain for 2 days and associated with nausea and vomiting.  Last bowel movement about 5 days prior to presentation.  CT abdomen and pelvis showed SBO with 2 transition points and moderate amount of retained stool in colon suggesting constipation, hepatic steatosis, bilateral nephrolithiasis and aortic atherosclerosis.  General surgery consulted.  The next day, KUB with contrast in colon none patient started passing gas.  Started on full liquid diet by surgery.  Subjective: Seen and examined earlier this morning.  No major events overnight of this morning.  Daughter and wife at bedside.  Patient reports passing gas.  Reports improvement in his pain.  He denies nausea or vomiting.  Passing gas but no bowel movement yet.  Also reports dizziness that he describes as vertigo but more with standing.  Reports taking meclizine at home.  He was told that his dizziness was from his ear in the past.  Objective: Vitals:   03/08/22 0542 03/08/22 1124 03/08/22 1126 03/08/22 1129  BP: 133/75 (!) 142/86 137/72 120/77  Pulse: 75 81 82 86  Resp: '20 16 18 16  '$ Temp: 97.6 F (36.4 C)  98 F (36.7 C)   TempSrc: Oral     SpO2: 94% 96% 97% 98%  Weight:      Height:        Examination:  GENERAL: No apparent distress.  Nontoxic. HEENT: MMM.  Vision and hearing grossly intact.  NECK: Supple.  No apparent JVD.  RESP:  No IWOB.  Fair aeration bilaterally. CVS:   RRR. Heart sounds normal.  ABD/GI/GU: BS+. Abd slightly distended.  Nontender. MSK/EXT:  Moves extremities. No apparent deformity. No edema.  SKIN: no apparent skin lesion or wound NEURO: Awake, alert and oriented appropriately.  No nystagmus on exam.  No apparent focal neuro deficit. PSYCH: Calm. Normal affect.   Procedures:  None  Microbiology summarized: None  Assessment and plan: Principal Problem:   SBO (small bowel obstruction) (HCC) Active Problems:   AKI (acute kidney injury) ([place])   Parkinson's disease (HCC)   BPH (benign prostatic hyperplasia)   Depression with anxiety   History of CVA (cerebrovascular accident)   GERD (gastroesophageal reflux disease)   Essential hypertension   Otitis media   HLD (hyperlipidemia)   Vertebrobasilar artery syndrome   Aortic atherosclerosis (HCC)   Hepatic steatosis  Small bowel obstruction: Presents with abdominal pain, nausea and vomiting.  LBM 5 days prior to presentation.  No prior abdominal surgeries.  CT suggested SBO with 2 transition points.  Patient passing gas.  X-ray with contrast in colon. -General surgery managing -On full liquid diet -Encouraged ambulation in the hall -Continue IV fluid   AKI: Likely due to #1.  Improved. Recent Labs    06/05/21 0837 07/07/21 1152 10/23/21 0916 03/04/22 1200 03/07/22 0400 03/08/22 0440  BUN '18 21 21 19 16 15  '$ CREATININE 1.22 1.23 1.26 1.47* 1.55* 1.28*  -Continue IV fluid -Avoid nephrotoxic meds -Recheck in the morning  Essential hypertension: Reports dizziness with standing. He could have orthostatic hypotension especially with history of Parkinson disease.  Does not seem to be on antihypertensive meds at home. -Check orthostatic vitals -P.o. hydralazine as needed  Dizziness: Multifactorial including possible orthostatic hypotension, vertebrobasilar artery syndrome, prior CVA and iatrogenic such as alfuzosin.  Has no focal neurodeficit or nystagmus on  exam. -PT/OT -Vestibular PT  Parkinson's disease -Resume home Sinemet  Hyperlipidemia/aortic atherosclerotic disease -Continue statin   Recent diagnosis of otitis media: Started on p.o. Augmentin outpatient. -Continue with IV Unasyn in house  BPH -Resume home Proscar and alfuzosin.   GERD -Continue Protonix  Leukocytosis: Likely demargination.  Improved.  Body mass index is 25.92 kg/m.           DVT prophylaxis:  enoxaparin (LOVENOX) injection 40 mg Start: 03/08/22 2200  Code Status: Full code Family Communication: Updated patient's wife and daughter at the bedside. Level of care: Telemetry Status is: Inpatient Remains inpatient appropriate because: Small bowel obstruction and AKI   Final disposition: Likely home once medically stable. Consultants:  General surgery  Sch Meds:  Scheduled Meds:  alfuzosin  10 mg Oral Daily   carbidopa-levodopa  2 tablet Per NG tube Daily   And   carbidopa-levodopa  1 tablet Per NG tube Daily   And   carbidopa-levodopa  1 tablet Per NG tube Daily   And   carbidopa-levodopa  1 tablet Per NG tube Daily   enoxaparin (LOVENOX) injection  40 mg Subcutaneous Q24H   finasteride  5 mg Oral Daily   fluticasone  1 spray Each Nare Daily   neomycin-polymyxin-hydrocortisone  3 drop Both EARS Q6H   pantoprazole (PROTONIX) IV  40 mg Intravenous Q24H   Continuous Infusions:  sodium chloride     ampicillin-sulbactam (UNASYN) IV 3 g (03/08/22 1042)   PRN Meds:.albuterol, hydrALAZINE, meclizine, ondansetron (ZOFRAN) IV, polyvinyl alcohol  Antimicrobials: Anti-infectives (From admission, onward)    Start     Dose/Rate Route Frequency Ordered Stop   03/08/22 0900  Ampicillin-Sulbactam (UNASYN) 3 g in sodium chloride 0.9 % 100 mL IVPB        3 g 200 mL/hr over 30 Minutes Intravenous Every 6 hours 03/08/22 0806 03/13/22 0859   03/07/22 1800  cefTRIAXone (ROCEPHIN) 1 g in sodium chloride 0.9 % 100 mL IVPB  Status:  Discontinued        1  g 200 mL/hr over 30 Minutes Intravenous Every 24 hours 03/07/22 1656 03/08/22 0806        I have personally reviewed the following labs and images: CBC: Recent Labs  Lab 03/04/22 1200 03/07/22 0400 03/08/22 0440  WBC 5.5 12.3* 9.1  NEUTROABS 3.5 10.7*  --   HGB 12.0* 14.5 12.3*  HCT 36.5* 43.5 38.0*  MCV 98.9 96.7 100.3*  PLT 162 190 163   BMP &GFR Recent Labs  Lab 03/04/22 1200 03/07/22 0400 03/08/22 0440  NA 139 139 144  K 4.0 4.0 3.8  CL 109 99 109  CO2 '27 30 28  '$ GLUCOSE 106* 144* 92  BUN '19 16 15  '$ CREATININE 1.47* 1.55* 1.28*  CALCIUM 8.9 9.9 9.1   Estimated Creatinine Clearance: 46 mL/min (A) (by C-G formula based on SCr of 1.28 mg/dL (H)). Liver & Pancreas: Recent Labs  Lab 03/04/22 1200 03/07/22 0400 03/08/22 0440  AST '19 21 15  '$ ALT '5 19 7  '$ ALKPHOS 62 84 69  BILITOT 0.6 1.0 1.3*  PROT 6.4* 7.7 6.4*  ALBUMIN 3.6 4.3 3.5  Recent Labs  Lab 03/07/22 0400  LIPASE 22   No results for input(s): "AMMONIA" in the last 168 hours. Diabetic: No results for input(s): "HGBA1C" in the last 72 hours. No results for input(s): "GLUCAP" in the last 168 hours. Cardiac Enzymes: No results for input(s): "CKTOTAL", "CKMB", "CKMBINDEX", "TROPONINI" in the last 168 hours. No results for input(s): "PROBNP" in the last 8760 hours. Coagulation Profile: No results for input(s): "INR", "PROTIME" in the last 168 hours. Thyroid Function Tests: No results for input(s): "TSH", "T4TOTAL", "FREET4", "T3FREE", "THYROIDAB" in the last 72 hours. Lipid Profile: No results for input(s): "CHOL", "HDL", "LDLCALC", "TRIG", "CHOLHDL", "LDLDIRECT" in the last 72 hours. Anemia Panel: No results for input(s): "VITAMINB12", "FOLATE", "FERRITIN", "TIBC", "IRON", "RETICCTPCT" in the last 72 hours. Urine analysis:    Component Value Date/Time   COLORURINE YELLOW 03/07/2022 0645   APPEARANCEUR HAZY (A) 03/07/2022 0645   LABSPEC 1.010 03/07/2022 0645   PHURINE 7.0 03/07/2022 0645    GLUCOSEU NEGATIVE 03/07/2022 0645   GLUCOSEU NEGATIVE 02/02/2021 1142   HGBUR NEGATIVE 03/07/2022 0645   BILIRUBINUR NEGATIVE 03/07/2022 0645   BILIRUBINUR neg 02/03/2016 1152   KETONESUR NEGATIVE 03/07/2022 0645   PROTEINUR NEGATIVE 03/07/2022 0645   UROBILINOGEN 0.2 02/02/2021 1142   NITRITE NEGATIVE 03/07/2022 0645   LEUKOCYTESUR NEGATIVE 03/07/2022 0645   Sepsis Labs: Invalid input(s): "PROCALCITONIN", "LACTICIDVEN"  Microbiology: No results found for this or any previous visit (from the past 240 hour(s)).  Radiology Studies: DG Abd 1 View  Result Date: 03/07/2022 CLINICAL DATA:  8 hour follow-up small-bowel protocol EXAM: ABDOMEN - 1 VIEW COMPARISON:  CT from earlier in the same day. FINDINGS: Some mild persistent small bowel dilatation is noted. Administered contrast now lies within the colon. IMPRESSION: Changes consistent with partial small bowel obstruction. Electronically Signed   By: Inez Catalina M.D.   On: 03/07/2022 23:33      Korin Setzler T. [place]  If 7PM-7AM, please contact night-coverage [email or web address] 03/08/2022, 12:39 PM

## 2022-03-08 NOTE — Plan of Care (Signed)

## 2022-03-08 NOTE — Progress Notes (Signed)
Subjective: CC: Feeling better with presenting abdominal pain resolved and now just feeling gas like pains on the right side of his abdomen. Less bloated/distended. No nausea. Started passing flatus and has had several episodes this am. No bm. Frustrated about not getting home meds.   Objective: Vital signs in last 24 hours: Temp:  [97.6 F (36.4 C)-98.5 F (36.9 C)] 97.6 F (36.4 C) (06/22 0542) Pulse Rate:  [67-77] 75 (06/22 0542) Resp:  [16-20] 20 (06/22 0542) BP: (126-147)/(73-86) 133/75 (06/22 0542) SpO2:  [94 %-99 %] 94 % (06/22 0542) Last BM Date : 03/02/22  Intake/Output from previous day: 06/21 0701 - 06/22 0700 In: 1108.3 [I.V.:8.2; IV Piggyback:1100.1] Out: 580 [Urine:500; Emesis/NG output:80] Intake/Output this shift: Total I/O In: 34 [P.O.:60] Out: -   PE: Gen:  Alert, NAD, pleasant Abd: Soft, mild distension, mild R sided ttp without peritonitis, +BS. NGT with minimal output in cannister. No palpable hernia on exam.  Psych: A&Ox3   Lab Results:  Recent Labs    03/07/22 0400 03/08/22 0440  WBC 12.3* 9.1  HGB 14.5 12.3*  HCT 43.5 38.0*  PLT 190 163   BMET Recent Labs    03/07/22 0400 03/08/22 0440  NA 139 144  K 4.0 3.8  CL 99 109  CO2 30 28  GLUCOSE 144* 92  BUN 16 15  CREATININE 1.55* 1.28*  CALCIUM 9.9 9.1   PT/INR No results for input(s): "LABPROT", "INR" in the last 72 hours. CMP     Component Value Date/Time   NA 144 03/08/2022 0440   K 3.8 03/08/2022 0440   CL 109 03/08/2022 0440   CO2 28 03/08/2022 0440   GLUCOSE 92 03/08/2022 0440   BUN 15 03/08/2022 0440   CREATININE 1.28 (H) 03/08/2022 0440   CREATININE 1.38 (H) 07/04/2020 1004   CALCIUM 9.1 03/08/2022 0440   PROT 6.4 (L) 03/08/2022 0440   ALBUMIN 3.5 03/08/2022 0440   AST 15 03/08/2022 0440   ALT 7 03/08/2022 0440   ALKPHOS 69 03/08/2022 0440   BILITOT 1.3 (H) 03/08/2022 0440   GFRNONAA 58 (L) 03/08/2022 0440   GFRAA >60 09/14/2016 1408   Lipase      Component Value Date/Time   LIPASE 22 03/07/2022 0400    Studies/Results: DG Abd 1 View  Result Date: 03/07/2022 CLINICAL DATA:  8 hour follow-up small-bowel protocol EXAM: ABDOMEN - 1 VIEW COMPARISON:  CT from earlier in the same day. FINDINGS: Some mild persistent small bowel dilatation is noted. Administered contrast now lies within the colon. IMPRESSION: Changes consistent with partial small bowel obstruction. Electronically Signed   By: Inez Catalina M.D.   On: 03/07/2022 23:33   DG Abd Portable 1V-Small Bowel Protocol-Position Verification  Result Date: 03/07/2022 CLINICAL DATA:  NG tube placement for small bowel obstruction. EXAM: PORTABLE ABDOMEN - 1 VIEW COMPARISON:  CT examination dated March 07, 2022 FINDINGS: Dilated small bowel loops measuring up to 4.1 cm concerning for obstruction/ileus. NG tube with distal tip projecting over the gastric body. Osseous structures are unremarkable. IMPRESSION: Dilated small bowel loops measuring up to 4.1 cm suggesting obstruction/ileus. NG tube with distal tip projecting over the body of the stomach. Electronically Signed   By: Keane Police D.O.   On: 03/07/2022 09:02   CT ABDOMEN PELVIS W CONTRAST  Result Date: 03/07/2022 CLINICAL DATA:  Constipation, abdominal pain, and vomiting. Bowel obstruction suspected. EXAM: CT ABDOMEN AND PELVIS WITH CONTRAST TECHNIQUE: Multidetector CT imaging of the abdomen and  pelvis was performed using the standard protocol following bolus administration of intravenous contrast. RADIATION DOSE REDUCTION: This exam was performed according to the departmental dose-optimization program which includes automated exposure control, adjustment of the mA and/or kV according to patient size and/or use of iterative reconstruction technique. CONTRAST:  167m OMNIPAQUE IOHEXOL 300 MG/ML  SOLN COMPARISON:  07/31/2021. FINDINGS: Lower chest: Mild atelectasis is present at the lung bases. Hepatobiliary: No focal liver abnormality. Fatty  infiltration of the liver is noted. No biliary ductal dilatation. The gallbladder is without stones. Pancreas: Unremarkable. No pancreatic ductal dilatation or surrounding inflammatory changes. Spleen: Normal in size without focal abnormality. Adrenals/Urinary Tract: No adrenal nodule or mass. Renal calculi are noted bilaterally. There is no hydronephrosis. A cyst is noted in the mid right kidney. The bladder is unremarkable. Stomach/Bowel: The stomach is distended with fluid. There are mildly distended loops of fluid-filled small bowel in the abdomen measuring up to 3.0 cm. No significant bowel wall thickening. There is mild hyperemia with free fluid in the mesenteric folds adjacent to the loops. Two transition points are identified in the small bowel, best seen on coronal image 68. The appendix is unremarkable. No free air or pneumatosis. A moderate amount of retained stool is present in the colon. Vascular/Lymphatic: Aortic atherosclerosis. No enlarged abdominal or pelvic lymph nodes. Reproductive: Prostate is unremarkable. Other: Small fat containing umbilical hernia. Small amount of free fluid in the mesenteric folds and pelvis. Musculoskeletal: Degenerative changes are present in the thoracolumbar spine. No acute osseous abnormality. IMPRESSION: 1. Mildly distended loops of small bowel in the abdomen and pelvis with 2 transition points common best seen on coronal image 68. A small amount of free fluid is noted in the mesenteric folds in this region. Findings may represent partial or early small bowel obstruction. Internal hernia should be included in the differential diagnosis. Surgical consultation is recommended. 2. Moderate amount of retained stool in the colon suggesting constipation. 3. Hepatic steatosis. 4. Bilateral nephrolithiasis. 5. Aortic atherosclerosis. Critical findings were reported to Dr. GRoxanne Minsat 59:98a.m. Electronically Signed   By: LBrett FairyM.D.   On: 03/07/2022 05:07     Anti-infectives: Anti-infectives (From admission, onward)    Start     Dose/Rate Route Frequency Ordered Stop   03/08/22 0900  Ampicillin-Sulbactam (UNASYN) 3 g in sodium chloride 0.9 % 100 mL IVPB        3 g 200 mL/hr over 30 Minutes Intravenous Every 6 hours 03/08/22 0806 03/13/22 0859   03/07/22 1800  cefTRIAXone (ROCEPHIN) 1 g in sodium chloride 0.9 % 100 mL IVPB  Status:  Discontinued        1 g 200 mL/hr over 30 Minutes Intravenous Every 24 hours 03/07/22 1656 03/08/22 0806        Assessment/Plan SBO - Patient does not have normal risk factors for SBO but is clinically and radiographically improving this am. He is HDS with normal WBC. He has contrast in his colon on xray, pain improved and is passing flatus. Will d/c ngt and give fld. If tolerates, can likely advance diet and home in am. If fails po challenge will likely need surgery.   FEN - D/c NGT. FLD VTE - SCDs, okay for chemical ppx from our standpoint ID - None indicated from a gen surgery standpoint. Being tx for otitis media as outpt - defer to [facility]  HTN HLD Parkinson's  AKI Dizziness Recent hx of otitis media  BPH GERD  I reviewed nursing notes, hospitalist notes,  last 24 h vitals and pain scores, last 48 h intake and output, last 24 h labs and trends, and last 24 h imaging results.   LOS: 1 day    Jillyn Ledger , [facility] 03/08/2022, 9:05 AM Please see Amion for pager number during day hours 7:00am-4:30pm

## 2022-03-08 NOTE — Progress Notes (Signed)
Transition of Care [facility]) Screening Note  Patient Details  Name: Paul Drake Date of Birth: [birth year]  Transition of Care [facility]) CM/SW Contact:    Sherie Don, LCSW Phone Number: 03/08/2022, 2:53 PM  Transition of Care Department [facility]) has reviewed patient and no TOC needs have been identified at this time. We will continue to monitor patient advancement through interdisciplinary progression rounds. If new patient transition needs arise, please place a TOC consult.

## 2022-03-08 NOTE — Care Management Obs Status (Signed)
Day NOTIFICATION   Patient Details  Name: Paul Drake MRN: [id number] Date of Birth: 1946-08-01   Medicare Observation Status Notification Given:  Yes    Sherie Don, LCSW 03/08/2022, 3:36 PM

## 2022-03-08 NOTE — Care Management CC44 (Signed)
Condition Code 44 Documentation Completed  Patient Details  Name: Khamron Gellert MRN: [id number] Date of Birth: 1946/07/05   Condition Code 44 given:  Yes Patient signature on Condition Code 44 notice:  Yes Documentation of 2 MD's agreement:  Yes Code 44 added to claim:  Yes    Sherie Don, LCSW 03/08/2022, 3:36 PM

## 2022-03-15 ENCOUNTER — Other Ambulatory Visit (HOSPITAL_BASED_OUTPATIENT_CLINIC_OR_DEPARTMENT_OTHER): Payer: Self-pay

## 2022-03-15 ENCOUNTER — Other Ambulatory Visit: Payer: Self-pay

## 2022-03-15 MED ORDER — GABAPENTIN 100 MG PO CAPS: 100.0000 mg | ORAL_CAPSULE | ORAL | 1 refills | Status: DC | PRN

## 2022-03-15 NOTE — Progress Notes (Signed)
Acute Office Visit  Subjective:     Patient ID: Paul Drake, male    DOB: [birth year], 76 y.o.   MRN: [id number]  CC: hospital follow-up   HPI Patient is in today for hospital follow-up.   Admitted: 03/07/22 to [place] Discharged 03/08/22 to Home   He presented to the ED with 2 days of sharp/stabbing LLQ pain and 5 days without a bowel movement. CT Abd/pelvis revealed small bowel obstruction with 2 transition points and moderate amount retained stool, hepatic steatosis, bilateral nephrolithiasis, and aortic atherosclerosis. General surgery was consulted, but the following day, patient started passing gas and resumed having good bowel movements and tolerated soft diet. He did have AKI during admission which improved by discharge. Slightly orthostatic, but chronic (hx of vertebrobasilar artery syndrome, prior CVA, parkinsons) and on several possible contributing meds. He was at baseline by discharge. He was advised to avoid constipation by using MiraLax or Senokot.   Today he reports he is feeling mostly better. He is having a bowel movement every day. They are still slightly loose, but no diarrhea, constipation, blood in stool/urine, vomiting, abdominal pain, fevers, chills, weakness, fatigue.        For awhile now (3-4 weeks) he has been having some ear pressure with dizziness. Reports dizziness is a "woozy" feeling like he's been on a roller coaster ride that gives his head a funny ache/nauseous sensation. He reports symptoms are triggered by head movements, rolling over in bed, changing positions quickly, etc. Reports he has history of seasonal allergies, but has not had any other symptoms. He did a virtual visit a few weeks ago and was treated for ABRS with Augmentin, but also given ABX ear drops and Meclizine. States he hasn't gotten any improvement with these treatments. He denies any new vision changes, chronic headaches, chest pain, dyspnea, falls.    ROS All review of systems  negative except what is listed in the HPI      Objective:    BP 107/72   Pulse 68   Ht 5' 7.5" (1.715 m)   Wt 173 lb (78.5 kg)   BMI 26.70 kg/m    Physical Exam Vitals reviewed.  Constitutional:      General: He is not in acute distress.    Appearance: Normal appearance. He is normal weight. He is not ill-appearing.  HENT:     Head: Normocephalic and atraumatic.     Comments: + Dix Hallpike (left significantly worse than right)    Right Ear: Tympanic membrane normal.     Left Ear: Tympanic membrane normal.  Cardiovascular:     Rate and Rhythm: Normal rate and regular rhythm.  Pulmonary:     Effort: Pulmonary effort is normal.     Breath sounds: Normal breath sounds.  Abdominal:     General: Abdomen is flat. Bowel sounds are normal. There is no distension.     Palpations: Abdomen is soft. There is no mass.     Tenderness: There is no abdominal tenderness. There is no guarding or rebound.  Neurological:     General: No focal deficit present.     Mental Status: He is alert and oriented to person, place, and time. Mental status is at baseline.  Psychiatric:        Mood and Affect: Mood normal.        Behavior: Behavior normal.        Thought Content: Thought content normal.        Judgment: Judgment normal.  Negative orthostatics today  No results found for any visits on 03/16/22.      Assessment & Plan:   1. Vertigo - You can continue the Meclizine you received if you find it helpful.  - Home exercises attached to try (you can also YouTube "Epley Maneuver") - Start doing Flonase (2 sprays each nostril) daily to help with the ear pressure, no signs of major fluid accumulation at this time.  - If you decide you want to see a physical therapist trained in the vertigo treatment, let us know.   2. Hospital discharge follow-up 3. History of small bowel obstruction Doing well. No new concerns. Repeating labs today.   - CBC - Comprehensive metabolic panel    No  orders of the defined types were placed in this encounter.   Return if symptoms worsen or fail to improve.  Terrilyn Saver, NP

## 2022-03-16 ENCOUNTER — Ambulatory Visit (INDEPENDENT_AMBULATORY_CARE_PROVIDER_SITE_OTHER): Payer: Medicare Other | Admitting: Family Medicine

## 2022-03-16 ENCOUNTER — Encounter: Payer: Self-pay | Admitting: Family Medicine

## 2022-03-16 VITALS — BP 107/72 | HR 68 | Ht 67.5 in | Wt 173.0 lb

## 2022-03-16 DIAGNOSIS — R7989 Other specified abnormal findings of blood chemistry: Secondary | ICD-10-CM

## 2022-03-16 DIAGNOSIS — Z09 Encounter for follow-up examination after completed treatment for conditions other than malignant neoplasm: Secondary | ICD-10-CM

## 2022-03-16 DIAGNOSIS — R42 Dizziness and giddiness: Secondary | ICD-10-CM

## 2022-03-16 DIAGNOSIS — Z8719 Personal history of other diseases of the digestive system: Secondary | ICD-10-CM

## 2022-03-16 LAB — COMPREHENSIVE METABOLIC PANEL
ALT: 8 U/L (ref 0–53)
AST: 17 U/L (ref 0–37)
Albumin: 4.3 g/dL (ref 3.5–5.2)
Alkaline Phosphatase: 90 U/L (ref 39–117)
BUN: 21 mg/dL (ref 6–23)
CO2: 30 mEq/L (ref 19–32)
Calcium: 9.5 mg/dL (ref 8.4–10.5)
Chloride: 102 mEq/L (ref 96–112)
Creatinine, Ser: 1.63 mg/dL — ABNORMAL HIGH (ref 0.40–1.50)
GFR: 40.31 mL/min — ABNORMAL LOW (ref >60.00)
Glucose, Bld: 79 mg/dL (ref 70–99)
Potassium: 4.6 mEq/L (ref 3.5–5.1)
Sodium: 138 mEq/L (ref 135–145)
Total Bilirubin: 0.5 mg/dL (ref 0.2–1.2)
Total Protein: 6.6 g/dL (ref 6.0–8.3)

## 2022-03-16 LAB — CBC
HCT: 38.6 % — ABNORMAL LOW (ref 39.0–52.0)
Hemoglobin: 12.8 g/dL — ABNORMAL LOW (ref 13.0–17.0)
MCHC: 33.1 g/dL (ref 30.0–36.0)
MCV: 97.3 fl (ref 78.0–100.0)
Platelets: 186 10*3/uL (ref 150.0–400.0)
RBC: 3.97 Mil/uL — ABNORMAL LOW (ref 4.22–5.81)
RDW: 12.7 % (ref 11.5–15.5)
WBC: 5.7 10*3/uL (ref 4.0–10.5)

## 2022-03-16 NOTE — Patient Instructions (Addendum)
Vertigo: - You can continue the Meclizine you received if you find it helpful.  - Home exercises attached to try (you can also YouTube "Epley Maneuver") - Start doing Flonase (2 sprays each nostril) daily to help with the ear pressure, no signs of major fluid accumulation at this time.  - If you decide you want to see a physical therapist trained in the vertigo treatment, let us know.   Please contact office for follow-up if symptoms do not improve or worsen. Seek emergency care if symptoms become severe.

## 2022-03-19 NOTE — Addendum Note (Signed)
Addended by: Caleen Jobs B on: 03/19/2022 04:59 PM   Modules accepted: Orders

## 2022-03-22 ENCOUNTER — Telehealth: Payer: Self-pay | Admitting: Family Medicine

## 2022-03-22 ENCOUNTER — Other Ambulatory Visit (INDEPENDENT_AMBULATORY_CARE_PROVIDER_SITE_OTHER): Payer: Medicare Other

## 2022-03-22 DIAGNOSIS — R7989 Other specified abnormal findings of blood chemistry: Secondary | ICD-10-CM

## 2022-03-22 DIAGNOSIS — R42 Dizziness and giddiness: Secondary | ICD-10-CM

## 2022-03-22 LAB — BASIC METABOLIC PANEL
BUN: 21 mg/dL (ref 6–23)
CO2: 28 mEq/L (ref 19–32)
Calcium: 9.4 mg/dL (ref 8.4–10.5)
Chloride: 104 mEq/L (ref 96–112)
Creatinine, Ser: 1.29 mg/dL (ref 0.40–1.50)
GFR: 53.37 mL/min — ABNORMAL LOW (ref >60.00)
Glucose, Bld: 114 mg/dL — ABNORMAL HIGH (ref 70–99)
Potassium: 4.2 mEq/L (ref 3.5–5.1)
Sodium: 141 mEq/L (ref 135–145)

## 2022-03-22 NOTE — Telephone Encounter (Signed)
Pt was given meclizine at the hospital for vertigo and would like to know if pcp could give him a refill. Please advise.    [id number] - [place], [place] - [address] AT [facility] [address]   [address], [place] [address]  Phone:  [phone]  Fax:  [phone]

## 2022-03-23 MED ORDER — MECLIZINE HCL 25 MG PO TABS: 25.0000 mg | ORAL_TABLET | Freq: Three times a day (TID) | ORAL | 0 refills | Status: DC | PRN

## 2022-03-23 NOTE — Telephone Encounter (Signed)
Previously prescribed by our office. Refill sent.

## 2022-03-26 ENCOUNTER — Other Ambulatory Visit: Payer: Self-pay

## 2022-03-26 ENCOUNTER — Encounter: Payer: Self-pay | Admitting: Physical Therapy

## 2022-03-26 ENCOUNTER — Ambulatory Visit: Payer: Medicare Other | Attending: Neurology | Admitting: Physical Therapy

## 2022-03-26 DIAGNOSIS — R42 Dizziness and giddiness: Secondary | ICD-10-CM | POA: Diagnosis not present

## 2022-03-26 DIAGNOSIS — G20 Parkinson's disease: Secondary | ICD-10-CM | POA: Diagnosis not present

## 2022-03-26 DIAGNOSIS — R2681 Unsteadiness on feet: Secondary | ICD-10-CM | POA: Insufficient documentation

## 2022-03-26 NOTE — Therapy (Signed)
OUTPATIENT PHYSICAL THERAPY NEURO EVALUATION   Patient Name: Paul Drake MRN: [id number] DOB:16-Jun-1946, 76 y.o., male Today's Date: 03/26/2022   PCP: Erline Levine A. Si Gaul, MD REFERRING PROVIDER: Alonza Bogus, DO    Past Medical History:  Diagnosis Date   Allergic state 12/29/2013   Arthritis    Barrett's esophagus    BPH (benign prostatic hyperplasia)    Chicken pox    CVA (cerebral infarction)    Depression    GERD (gastroesophageal reflux disease)    History of kidney stones    HTN (hypertension), benign 04/01/2015   Hyperglycemia 01/06/2015   Hyperlipemia    Hypertension    IBS (irritable bowel syndrome)    Left hand pain 07/21/2017   Measles as a child   Medicare annual wellness visit, subsequent 06/26/2015   Mumps as a child   Otitis externa 12/10/2013   Seasonal allergies    some asthma   Stroke [facility])    Unspecified asthma(493.90) 12/29/2013   Past Surgical History:  Procedure Laterality Date   COLONOSCOPY     EP IMPLANTABLE DEVICE N/A 10/16/2016   Procedure: Loop Recorder Insertion;  Surgeon: Thompson Grayer, MD;  Location: [facility];  Service: Cardiovascular;  Laterality: N/A;   GANGLION CYST EXCISION Left 06/08/2021   Procedure: LEFT VOLAR GANGLION EXCISION;  Surgeon: Sherilyn Cooter, MD;  Location: [facility];  Service: Orthopedics;  Laterality: Left;   implantable loop recorder removal  09/21/2020   Reveal LINQ removed in office   LITHOTRIPSY     multiple times   PROSTATE SURGERY  08/2017   TEE WITHOUT CARDIOVERSION N/A 09/22/2014   Procedure: TRANSESOPHAGEAL ECHOCARDIOGRAM (TEE);  Surgeon: Josue Hector, MD;  Location: [facility];  Service: Cardiovascular;  Laterality: N/A;   WISDOM TOOTH EXTRACTION     Patient Active Problem List   Diagnosis Date Noted   Aortic atherosclerosis ([place]) 03/08/2022   Hepatic steatosis 03/08/2022   Orthostatic hypotension 03/08/2022   SBO (small bowel obstruction) ([place]) 03/07/2022   AKI (acute kidney  injury) ([place]) 03/07/2022   Peripheral neuropathy 06/19/2021   Macrocytic anemia 06/19/2021   Ganglion cyst of volar aspect of left wrist 05/23/2021   Sinusitis 02/02/2021   Urinary frequency 06/27/2020   Finger pain, left 04/15/2020   Tinnitus of both ears 02/08/2020   Headache 02/08/2020   Parkinson's disease ([place]) 11/29/2019   Ganglion cyst of dorsum of left wrist 05/19/2018   Elevated testosterone level in male 05/19/2018   REM behavioral disorder 01/27/2018   Asthma 10/30/2017   Vertebrobasilar artery syndrome 07/29/2017   Left hand pain 07/21/2017   Chronic deep vein thrombosis (DVT) of popliteal vein of right lower extremity ([place]) 01/17/2017   TIA (transient ischemic attack) 09/26/2016   HLD (hyperlipidemia) 02/14/2016   Cerebrovascular accident (CVA) due to embolism of right middle cerebral artery ([place]) 08/15/2015   Medicare annual wellness visit, subsequent 06/26/2015   Eustachian tube dysfunction 03/11/2015   Hyperglycemia 01/06/2015   Allergic rhinitis 12/15/2014   Otitis media 12/15/2014   Vertigo 12/15/2014   PFO (patent foramen ovale) 11/24/2014   Essential hypertension 11/24/2014   DVT, lower extremity ([place]) 10/04/2014   Barrett's esophagus 08/16/2014   Lumbago 08/16/2014   IBS (irritable bowel syndrome) 08/16/2014   Abdominal pain 07/06/2014   Allergic state 12/29/2013   Unspecified asthma(493.90) 12/29/2013   GERD (gastroesophageal reflux disease)    Need for prophylactic vaccination with combined diphtheria-tetanus-pertussis (DTP) vaccine 11/15/2013   BPH (benign prostatic hyperplasia) 11/15/2013   Renal lithiasis 11/15/2013  Depression with anxiety 11/15/2013   Anosmia 11/15/2013   History of CVA (cerebrovascular accident) 11/15/2013   [place] arthritis, thumb, degenerative 11/04/2013    ONSET DATE: 03/05/2022 (MD order)  REFERRING DIAG: G20 (ICD-10-CM) - Parkinson's disease ([place])   THERAPY DIAG:  No diagnosis found.  Rationale for Evaluation and  Treatment Rehabilitation  SUBJECTIVE:                                                                                                                                                                                              SUBJECTIVE STATEMENT: Was hospitalized for bowel obstruction and was eventually able to pass that on my own without surgery.  Had an episode of vertigo.  Did research and did the Epley on my own; haven't had vertigo since. Had an episode several months ago of falling out of bed where I hit my head and fractured my toes.  Was previously doing a lot of walking.  The toes feel better, but can walk better. My balance doesn't seem to be as good as it was.  Still playing golf. Pt accompanied by: self  PERTINENT HISTORY: Hx of Parkinson's disease; see above  PAIN:  Are you having pain? Yes: NPRS scale: 7-8/10 Pain location: L hand Pain description: numbness, tingling Aggravating factors: worse in evening Relieving factors: Gabapentin  PAIN:  Are you having pain? Yes: NPRS scale: 3-4/10 Pain location: toes R and L foot Pain description: soreness Aggravating factors: reaching up, walking Relieving factors: resting   PRECAUTIONS: Fall and Other: RBD at night and has fallen out of bed  WEIGHT BEARING RESTRICTIONS No  FALLS: Has patient fallen in last 6 months? Yes. Number of falls 1 fall out of bed  LIVING ENVIRONMENT: Lives with: lives with their spouse Lives in: House/apartment Stairs: Yes: Internal: 12 steps; on right going up and External: 1-2 steps; on right going up Has following equipment at home:  walking poles  PLOF: Independent; walks daily and slowly returning to total gym; plays golf 1x/wk  PATIENT GOALS Was to get into pool when my toes were hurting, but they are better; would like to make sure vertigo is cleared.  OBJECTIVE:   DIAGNOSTIC FINDINGS: CT cervical spine negative (after fall out of bed 11/2021)  COGNITION: Overall cognitive status:  Within functional limits for tasks assessed    BED MOBILITY:  Independent  TRANSFERS: Assistive device utilized: None  Sit to stand: Complete Independence Stand to sit: Complete Independence GAIT: Gait pattern: step through pattern and decreased arm swing- Left Distance walked: 60 ft x 4 reps Assistive device utilized: None Level of assistance: Modified independence  Comments: decreased push-off bilaterally (pt with hx of toe pain due to fractures, but has improved)  FUNCTIONAL TESTs:  5 times sit to stand: 9.1 Timed up and go (TUG): 10.63 sec Functional gait assessment: 27/30 TUG cognitive:  14.35 sec Gait velocity:  8.91 sec = 3.68 ft/sec   [facility] PT Assessment - 03/26/22 0001       Functional Gait  Assessment   Gait assessed  Yes    Gait Level Surface Walks 20 ft in less than 5.5 sec, no assistive devices, good speed, no evidence for imbalance, normal gait pattern, deviates no more than 6 in outside of the 12 in walkway width.    Change in Gait Speed Able to smoothly change walking speed without loss of balance or gait deviation. Deviate no more than 6 in outside of the 12 in walkway width.    Gait with Horizontal Head Turns Performs head turns smoothly with no change in gait. Deviates no more than 6 in outside 12 in walkway width    Gait with Vertical Head Turns Performs head turns with no change in gait. Deviates no more than 6 in outside 12 in walkway width.    Gait and Pivot Turn Pivot turns safely within 3 sec and stops quickly with no loss of balance.    Step Over Obstacle Is able to step over one shoe box (4.5 in total height) without changing gait speed. No evidence of imbalance.    Gait with Narrow Base of Support Is able to ambulate for 10 steps heel to toe with no staggering.    Gait with Eyes Closed Walks 20 ft, uses assistive device, slower speed, mild gait deviations, deviates 6-10 in outside 12 in walkway width. Ambulates 20 ft in less than 9 sec but greater than 7  sec.    Ambulating Backwards Walks 20 ft, no assistive devices, good speed, no evidence for imbalance, normal gait    Steps Alternating feet, must use rail.    Total Score 27             Vestibular Assessment - 03/26/22 0001       Symptom Behavior   Subjective history of current problem Had vertigo episode with lying down to left in bed since he fell out of bed and hit head on floor    Type of Dizziness  Spinning    Duration of Dizziness seconds    Symptom Nature Positional    Aggravating Factors Lying supine   and to the left   Relieving Factors Rest   Pt has recently done a Self-Epley manuever and BPPV has resolved   Progression of Symptoms Better      Oculomotor Exam   Oculomotor Alignment Normal    Ocular ROM WNL    Spontaneous Absent    Gaze-induced  Absent    Head shaking Horizontal Absent    Smooth Pursuits Intact    Saccades Intact      Vestibulo-Ocular Reflex   VOR 1 Head Only (x 1 viewing) Normal    VOR Cancellation Normal      Positional Testing   Dix-Hallpike Dix-Hallpike Right;Dix-Hallpike Left    Sidelying Test Sidelying Right;Sidelying Left      Dix-Hallpike Right   Dix-Hallpike Right Duration None    Dix-Hallpike Right Symptoms No nystagmus      Dix-Hallpike Left   Dix-Hallpike Left Duration non    Dix-Hallpike Left Symptoms No nystagmus      Sidelying Right   Sidelying Right Duration  none    Sidelying Right Symptoms No nystagmus      Sidelying Left   Sidelying Left Duration none    Sidelying Left Symptoms No nystagmus              PATIENT EDUCATION: Education details: PT eval results, POC; will leave POC/chart open for 30 days in case vertigo returns and needs to be reassessed. Person educated: Patient Education method: Explanation Education comprehension: verbalized understanding   HOME EXERCISE PROGRAM: NA    GOALS: Goals reviewed with patient? Yes  SHORT TERM GOALS= LTGs  LONG TERM GOALS: Target date: 04/27/2022  Pt  will be independent with HEP for improved dizziness, dual tasking with gait and balance.  Baseline:  Goal status: INITIAL  2.  Pt will report no dizziness with bed mobility and rolling. Baseline:  Goal status: INITIAL  ASSESSMENT:  CLINICAL IMPRESSION: Patient is a 76 y.o. male who was seen today for physical therapy evaluation and treatment for Parkinson's disease.  Initially, this referral was for aquatic therapy due to pt having toe fractures and pain after experiencing a fall out of bed due to REM sleep disorder.  However, his pain has greatly improved and he is back to his baseline of walking and exercise.  He has had an episode of positional vertigo, which he treated himself at home with Epley maneuver, and it resolved.  There is no evidence of BPPV today in session.  Pt demonstrates mild balance difficulty with dual task activity.  Overall, pt feels he is back to baseline and does not need course of therapy at this time.  Functional measures for transfers, balance and gait velocity are WNL.  PT will plan to leave chart open for 4 weeks in the event positional vertigo returns and needs to be treated; otherwise, if pt has not called/returned, plan for d/c at that time.   OBJECTIVE IMPAIRMENTS Abnormal gait, decreased balance, and dizziness.   ACTIVITY LIMITATIONS locomotion level  PARTICIPATION LIMITATIONS: community activity  PERSONAL FACTORS 3+ comorbidities: see above  are also affecting patient's functional outcome.   REHAB POTENTIAL: Good  CLINICAL DECISION MAKING: Evolving/moderate complexity  EVALUATION COMPLEXITY: Moderate  PLAN: PT FREQUENCY: 1x/week  PT DURATION: 4 weeks  PLANNED INTERVENTIONS: Therapeutic exercises, Therapeutic activity, Neuromuscular re-education, Balance training, Gait training, Patient/Family education, Vestibular training, and Canal[place] repositioning  PLAN FOR NEXT SESSION: Reasess positional vertigo and treat if needed, if pt returns with c/o.   Otherwise, d/c after 30 days.   Frazier Butt., PT 03/26/2022, 3:38 PM  [facility] at [facility], [address], [place] [address] Phone # [phone] Fax # [phone]

## 2022-04-04 DIAGNOSIS — L821 Other seborrheic keratosis: Secondary | ICD-10-CM | POA: Diagnosis not present

## 2022-04-04 DIAGNOSIS — L570 Actinic keratosis: Secondary | ICD-10-CM | POA: Diagnosis not present

## 2022-04-09 ENCOUNTER — Other Ambulatory Visit: Payer: Self-pay | Admitting: Family Medicine

## 2022-04-11 ENCOUNTER — Other Ambulatory Visit: Payer: Self-pay

## 2022-04-11 MED ORDER — VENLAFAXINE HCL ER 150 MG PO CP24
ORAL_CAPSULE | ORAL | 1 refills | Status: DC
Start: 2022-04-11 — End: 2022-07-13

## 2022-04-23 ENCOUNTER — Other Ambulatory Visit: Payer: Self-pay | Admitting: *Deleted

## 2022-04-23 MED ORDER — SIMVASTATIN 20 MG PO TABS: 20.0000 mg | ORAL_TABLET | Freq: Every day | ORAL | 1 refills | Status: DC

## 2022-04-26 DIAGNOSIS — R310 Gross hematuria: Secondary | ICD-10-CM | POA: Diagnosis not present

## 2022-04-26 DIAGNOSIS — N200 Calculus of kidney: Secondary | ICD-10-CM | POA: Diagnosis not present

## 2022-04-30 DIAGNOSIS — R310 Gross hematuria: Secondary | ICD-10-CM | POA: Diagnosis not present

## 2022-04-30 DIAGNOSIS — N202 Calculus of kidney with calculus of ureter: Secondary | ICD-10-CM | POA: Diagnosis not present

## 2022-04-30 DIAGNOSIS — N210 Calculus in bladder: Secondary | ICD-10-CM | POA: Diagnosis not present

## 2022-04-30 DIAGNOSIS — N132 Hydronephrosis with renal and ureteral calculous obstruction: Secondary | ICD-10-CM | POA: Diagnosis not present

## 2022-04-30 DIAGNOSIS — I700 Atherosclerosis of aorta: Secondary | ICD-10-CM | POA: Diagnosis not present

## 2022-05-13 NOTE — Assessment & Plan Note (Signed)
Tolerating statin, encouraged heart healthy diet, avoid trans fats, minimize simple carbs and saturated fats. Increase exercise as tolerated. Tolerating Simvastatin

## 2022-05-13 NOTE — Assessment & Plan Note (Signed)
Well controlled, no changes to meds. Encouraged heart healthy diet such as the DASH diet and exercise as tolerated.  °

## 2022-05-13 NOTE — Assessment & Plan Note (Signed)
hgba1c acceptable, minimize simple carbs. Increase exercise as tolerated.  

## 2022-05-14 ENCOUNTER — Ambulatory Visit (INDEPENDENT_AMBULATORY_CARE_PROVIDER_SITE_OTHER): Payer: Medicare Other | Admitting: Family Medicine

## 2022-05-14 DIAGNOSIS — G20 Parkinson's disease: Secondary | ICD-10-CM | POA: Diagnosis not present

## 2022-05-14 DIAGNOSIS — E785 Hyperlipidemia, unspecified: Secondary | ICD-10-CM | POA: Diagnosis not present

## 2022-05-14 DIAGNOSIS — R739 Hyperglycemia, unspecified: Secondary | ICD-10-CM

## 2022-05-14 DIAGNOSIS — I10 Essential (primary) hypertension: Secondary | ICD-10-CM

## 2022-05-14 DIAGNOSIS — G20A1 Parkinson's disease without dyskinesia, without mention of fluctuations: Secondary | ICD-10-CM

## 2022-05-14 LAB — CBC
HCT: 38.7 % — ABNORMAL LOW (ref 39.0–52.0)
Hemoglobin: 12.9 g/dL — ABNORMAL LOW (ref 13.0–17.0)
MCHC: 33.4 g/dL (ref 30.0–36.0)
MCV: 96.7 fl (ref 78.0–100.0)
Platelets: 173 10*3/uL (ref 150.0–400.0)
RBC: 4.01 Mil/uL — ABNORMAL LOW (ref 4.22–5.81)
RDW: 13.1 % (ref 11.5–15.5)
WBC: 6.7 10*3/uL (ref 4.0–10.5)

## 2022-05-14 LAB — COMPREHENSIVE METABOLIC PANEL
ALT: 5 U/L (ref 0–53)
AST: 17 U/L (ref 0–37)
Albumin: 4.4 g/dL (ref 3.5–5.2)
Alkaline Phosphatase: 71 U/L (ref 39–117)
BUN: 22 mg/dL (ref 6–23)
CO2: 28 mEq/L (ref 19–32)
Calcium: 9.6 mg/dL (ref 8.4–10.5)
Chloride: 102 mEq/L (ref 96–112)
Creatinine, Ser: 1.24 mg/dL (ref 0.40–1.50)
GFR: 55.91 mL/min — ABNORMAL LOW (ref >60.00)
Glucose, Bld: 76 mg/dL (ref 70–99)
Potassium: 4.8 mEq/L (ref 3.5–5.1)
Sodium: 140 mEq/L (ref 135–145)
Total Bilirubin: 0.4 mg/dL (ref 0.2–1.2)
Total Protein: 6.9 g/dL (ref 6.0–8.3)

## 2022-05-14 LAB — HEMOGLOBIN A1C: Hgb A1c MFr Bld: 5.8 % (ref 4.6–6.5)

## 2022-05-14 LAB — LIPID PANEL
Cholesterol: 151 mg/dL (ref 0–200)
HDL: 64.2 mg/dL
LDL Cholesterol: 67 mg/dL (ref 0–99)
NonHDL: 86.89
Total CHOL/HDL Ratio: 2
Triglycerides: 97 mg/dL (ref 0.0–149.0)
VLDL: 19.4 mg/dL (ref 0.0–40.0)

## 2022-05-14 LAB — TSH: TSH: 1.66 u[IU]/mL (ref 0.35–5.50)

## 2022-05-14 NOTE — Patient Instructions (Addendum)
Yer[place] do not drink  RSV (respiratory syncitial virus) once at pharmacy  New covid booster should be available in October  Flu in September  Encouraged increased hydration and fiber in diet. Daily probiotics. If bowels not moving can use MilkOfMagnesia 2 tbls po in 4 oz of warm prune juice by mouth every 2-3 days. If no results then repeat in 4 hours with  Dulcolax suppository pr, may repeat again in 4 more hours as needed. Seek care if symptoms worsen. Consider daily Miralax and/or Dulcolax if symptoms persist.    Bowel Obstruction A bowel obstruction is a blockage in the small or large bowel. The bowel is also called the intestine. It is a long tube that connects the stomach to the anus. When a person eats and drinks, food and fluids go from the mouth to the stomach to the small bowel. This is where most of the nutrients in the food and fluids are absorbed. After the small bowel, material passes through the large bowel for further absorption until any leftover material leaves the body as stool (feces) through the anus during a bowel movement. A bowel obstruction will prevent food and fluids from passing through the bowel as they normally do during digestion. The bowel can become partially or completely blocked. If this condition is not treated, it can be dangerous because the bowel could rupture. What are the causes? Common causes of this condition include: Scar tissue in the body (adhesions) from previous surgery or treatment with high-energy X-rays (radiation). Recent surgery. This may cause the movements of the bowel to slow down and cause food to block the intestine. Inflammatory bowel disease, such as Crohn's disease or diverticulitis. Growths or tumors. A bulging organ or tissue (hernia). Twisting of the bowel (volvulus). A swallowed object (foreign body). Slipping of a part of the bowel into another part (intussusception). What are the signs or symptoms? Symptoms of this  condition include: Pain in the abdomen. Depending on the degree of obstruction, pain may be: Mild or severe. Dull cramping or sharp pain. In one area or in the entire abdomen. Nausea and vomiting. Vomit may be greenish or a yellow bile color. Bloating in the abdomen. Constipation. Being unable to pass gas. Frequent belching. Diarrhea. This may occur if the obstruction is partial and runny stool is able to leak around the obstruction. How is this diagnosed? This condition may be diagnosed based on: A physical exam. Your medical history. Exams to look into the small intestine or the large intestine (endoscopy or colonoscopy). Imaging tests of the abdomen or pelvis, such as X-ray or CT scan. Blood or urine tests. How is this treated? Treatment for this condition depends on the cause and severity of the problem. Treatment may include: Fluids and pain medicines that are given through an IV. Your health care provider may tell you not to eat or drink if you have nausea or vomiting. A clear liquid diet. You may be asked to consume a clear liquid diet for several days. This allows the bowel to rest. Placement of a small tube (nasogastric tube) through the nose, down the throat, and into the stomach. This may relieve pain, discomfort, and nausea by removing blocked air and fluids from the stomach. It can also help the obstruction clear up faster. Surgery. This may be required if other treatments do not work. Surgery may be required for: Bowel obstruction from a hernia. This can be an emergency procedure. Scar tissue that causes frequent or severe obstructions.  Follow these instructions at home: Medicines Take over-the-counter and prescription medicines only as told by your health care provider. If you were prescribed an antibiotic medicine, take it as told by your health care provider. Do not stop taking the antibiotic even if you start to feel better. General instructions Follow instructions  from your health care provider about eating and drinking restrictions. You may need to avoid solid foods and drink only clear liquids until your condition improves. Return to your normal activities as told by your health care provider. Ask your health care provider what activities are safe for you. Rest as told by your health care provider. Avoid sitting for a long time without moving. Get up to take short walks every 1-2 hours. This is important to improve blood flow and breathing. Ask for help if you feel weak or unsteady. Keep all follow-up visits. This is important. How is this prevented? After having a bowel obstruction, you are more likely to have another. You may do the following things to prevent another obstruction: If you have a long-term (chronic) disease, pay attention to your symptoms and contact your health care provider if you have questions or concerns. Avoid becoming constipated. You may need to take these actions to prevent or treat constipation: Drink enough fluid to keep your urine pale yellow. Take over-the-counter or prescription medicines. Eat foods that are high in fiber, such as beans, whole grains, and fresh fruits and vegetables. Limit foods that are high in fat and processed sugars, such as fried or sweet foods. Stay active. Exercise for 30 minutes or more, 5 or more days each week. Ask your health care provider which exercises are safe for you. Avoid stress. Find ways to reduce stress, such as meditation, exercise, or taking time for activities that relax you. Instead of eating three large meals each day, eat three small meals with three small snacks. Work with a Microbiologist to make a healthy meal plan that works for you. Do not use any products that contain nicotine or tobacco. These products include cigarettes, chewing tobacco, and vaping devices, such as e-cigarettes. If you need help quitting, ask your health care provider. Contact a health care provider if: You have  a fever. You have chills. Get help right away if: You have increased pain or cramping. You vomit blood. You have uncontrolled vomiting or nausea. You cannot drink fluids because of vomiting or pain. You become confused. You begin feeling very thirsty (dehydrated). You have severe bloating. You feel extremely weak or you faint. Summary A bowel obstruction is a blockage in the small or large bowel. A bowel obstruction will prevent food and fluids from passing through the bowel as they normally do during digestion. Treatment for this condition depends on the cause and severity of the problem. It may include fluids and pain medicines through an IV, a simple diet, a nasogastric tube, or surgery. Follow instructions from your health care provider about eating restrictions. You may need to avoid solid foods and consume only clear liquids until your condition improves. This information is not intended to replace advice given to you by your health care provider. Make sure you discuss any questions you have with your health care provider. Document Revised: 10/16/2020 Document Reviewed: 10/16/2020 Elsevier Patient Education  [place].

## 2022-05-14 NOTE — Assessment & Plan Note (Signed)
Following with neurology and doing as well as he can. Frustrated but managing well on a day to day basis.

## 2022-05-14 NOTE — Progress Notes (Signed)
Subjective:    Patient ID: Paul Drake, male    DOB: 07/04/1946, 76 y.o.   MRN: [id number]  Chief Complaint  Patient presents with   Follow-up    Here for follow up 6 month    HPI Patient is in today for follow-up on chronic medical conditions.  No recent febrile illness or acute hospitalizations.  He continues to follow with neurology for his Parkinson's and is not progressing rapidly but does get frustrated with his symptoms.  He did suffer a bowel blockage back in June but was able to clear the blockage with conservative measures and an NG tube.  He has been working hard to keep his bowels moving with MiraLAX and senna S since then generally moving his bowels comfortably and denies any bloody or tarry stool.  He sees his urologist Wednesday and does continue to struggle with urinary frequency but no hematuria or dysuria. Denies CP/palp/SOB/HA/congestion/fevers. Taking meds as prescribed   Past Medical History:  Diagnosis Date   Allergic state 12/29/2013   Arthritis    Barrett's esophagus    BPH (benign prostatic hyperplasia)    Chicken pox    CVA (cerebral infarction)    Depression    GERD (gastroesophageal reflux disease)    History of kidney stones    HTN (hypertension), benign 04/01/2015   Hyperglycemia 01/06/2015   Hyperlipemia    Hypertension    IBS (irritable bowel syndrome)    Left hand pain 07/21/2017   Measles as a child   Medicare annual wellness visit, subsequent 06/26/2015   Mumps as a child   Otitis externa 12/10/2013   Seasonal allergies    some asthma   Stroke [facility])    Unspecified asthma(493.90) 12/29/2013    Past Surgical History:  Procedure Laterality Date   COLONOSCOPY     EP IMPLANTABLE DEVICE N/A 10/16/2016   Procedure: Loop Recorder Insertion;  Surgeon: Thompson Grayer, MD;  Location: [facility];  Service: Cardiovascular;  Laterality: N/A;   GANGLION CYST EXCISION Left 06/08/2021   Procedure: LEFT VOLAR GANGLION EXCISION;  Surgeon: Sherilyn Cooter, MD;  Location: [place];  Service: Orthopedics;  Laterality: Left;   implantable loop recorder removal  09/21/2020   Reveal LINQ removed in office   LITHOTRIPSY     multiple times   PROSTATE SURGERY  08/2017   TEE WITHOUT CARDIOVERSION N/A 09/22/2014   Procedure: TRANSESOPHAGEAL ECHOCARDIOGRAM (TEE);  Surgeon: Josue Hector, MD;  Location: [facility] ENDOSCOPY;  Service: Cardiovascular;  Laterality: N/A;   WISDOM TOOTH EXTRACTION      Family History  Problem Relation Age of Onset   Hypertension Mother    Hyperlipidemia Mother    Fibromyalgia Mother    Arthritis Mother        rheumatoid   Diabetes Sister        type 2   Hyperlipidemia Brother    Hypertension Brother    Prostate cancer Brother    Ulcers Father 52       Bleeding Ulcers   Kidney Stones Daughter    Asthma Daughter    Healthy Son    Cancer Maternal Grandfather        skin ?   Stroke Maternal Aunt    Cancer Maternal Uncle        prostate    Social History   Socioeconomic History   Marital status: Married    Spouse name: Webb Silversmith   Number of children: 3   Years of education: college  Highest education level: Master's degree (e.g., MA, MS, MEng, MEd, MSW, MBA)  Occupational History   Occupation: retired    Comment: retired  Tobacco Use   Smoking status: Never   Smokeless tobacco: Never  Vaping Use   Vaping Use: Never used  Substance and Sexual Activity   Alcohol use: Not Currently    Alcohol/week: 1.0 standard drink of alcohol    Types: 1 Shots of liquor per week    Comment: last drink 8-10 weeks ago.   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 They are living in their daughter's house to help to help take care of their granddaughter.      Patient uses a home  gym 3-4 x a week.      Social Determinants of Health   Financial Resource Strain: Low Risk  (08/01/2021)   Overall Financial Resource Strain (CARDIA)    Difficulty of Paying Living Expenses: Not hard at all  Food Insecurity: No Food Insecurity (08/01/2021)   Hunger Vital Sign    Worried About Running Out of Food in the Last Year: Never true    Ran Out of Food in the Last Year: Never true  Transportation Needs: No Transportation Needs (08/01/2021)   PRAPARE - Hydrologist (Medical): No    Lack of Transportation (Non-Medical): No  Physical Activity: Insufficiently Active (08/01/2021)   Exercise Vital Sign    Days of Exercise per Week: 1 day    Minutes of Exercise per Session: 60 min  Stress: No Stress Concern Present (08/01/2021)   [place]    Feeling of Stress : Not at all  Social Connections: Moderately Isolated (08/01/2021)   Social Connection and Isolation Panel [NHANES]    Frequency of Communication with Friends and Family: More than three times a week    Frequency of Social Gatherings with Friends and Family: More than three times a week    Attends Religious Services: Never    Marine scientist or Organizations: No    Attends Archivist Meetings: Never    Marital Status: Married  Human resources officer Violence: Not At Risk (08/01/2021)   Humiliation, Afraid, Rape, and Kick questionnaire    Fear of Current or Ex-Partner: No    Emotionally Abused: No    Physically Abused: No    Sexually Abused: No    Outpatient Medications Prior to Visit  Medication Sig Dispense Refill   acetaminophen (TYLENOL) 500 MG tablet Take 1,000 mg by mouth daily as needed for mild pain or headache.     albuterol (VENTOLIN HFA) 108 (90 Base) MCG/ACT inhaler Inhale 2 puffs into the lungs every 6 (six) hours as needed for wheezing or shortness of breath. 1 each 3   Bioflavonoid Products (VITAMIN  C) CHEW Chew 1 each by mouth daily.     carbidopa-levodopa (SINEMET IR) 25-100 MG tablet 2 at 7am, 1 at 10:30, 1 at 2 pm, 1 at 5:30 pm (Patient taking differently: Take 1-2 tablets by mouth See admin instructions. 2 tablets at 07:00, 1 tablet at 10:30, 1 tablet at 14:00, 1 tablet at 17:30) 450 tablet 1   cetirizine (ZYRTEC) 10 MG tablet  Take 10 mg by mouth daily.     clonazePAM (KLONOPIN) 0.5 MG tablet Take 1 tablet (0.5 mg total) by mouth daily. (Patient taking differently: Take 0.5 mg by mouth daily as needed for anxiety.) 30 tablet 4   clopidogrel (PLAVIX) 75 MG tablet Take 75 mg by mouth daily. On this medication since 2020 after stroke     finasteride (PROSCAR) 5 MG tablet Take 5 mg by mouth daily.     fluticasone (FLONASE) 50 MCG/ACT nasal spray Place 1 spray into both nostrils daily.     gabapentin (NEURONTIN) 100 MG capsule Take 1 capsule (100 mg total) by mouth every 4 (four) hours as needed (hand pain). 90 capsule 1   lidocaine (LIDODERM) 5 % Place 1 patch onto the skin daily as needed. Remove & Discard patch within 12 hours or as directed by MD 15 patch 0   Lidocaine 4 % SOLN Apply 1 Application topically daily as needed (hand pain).     meclizine (ANTIVERT) 25 MG tablet Take 1 tablet (25 mg total) by mouth 3 (three) times daily as needed for dizziness. 30 tablet 0   Menthol, Topical Analgesic, (BIOFREEZE EX) Apply 1 application topically daily as needed (hand pain).     Multiple Vitamins-Minerals (MULTIVITAMINS THER. W/MINERALS) TABS tablet Take 1 tablet by mouth daily.     Multiple Vitamins-Minerals (ZINC PO) Take 1 tablet by mouth daily.     Naphazoline-Pheniramine (VISINE-A OP) Place 1 drop into both eyes 2 (two) times daily as needed (itchy eyes).     neomycin-polymyxin-hydrocortisone (CORTISPORIN) OTIC solution Place 3 drops into both ears 4 (four) times daily. X 5 days 10 mL 0   Omega-3 Fatty Acids (OMEGA-3 PO) Take 350 mg by mouth daily.     oxyCODONE (ROXICODONE) 5 MG immediate  release tablet Take 1 tablet (5 mg total) by mouth every 4 (four) hours as needed for severe pain. 5 tablet 0   pantoprazole (PROTONIX) 40 MG tablet TAKE 1 TABLET(40 MG) BY MOUTH DAILY (Patient taking differently: Take 40 mg by mouth daily.) 90 tablet 3   Polyethyl Glycol-Propyl Glycol (SYSTANE ULTRA OP) Place 1 drop into both eyes 2 (two) times daily as needed (dry eyes).     polyethylene glycol powder (MIRALAX) 17 GM/SCOOP powder Take 17 g by mouth 2 (two) times daily as needed for moderate constipation. 255 g 1   Probiotic Product (PROBIOTIC PO) Take 1 capsule by mouth daily.     senna-docusate (SENOKOT-S) 8.6-50 MG tablet Take 1 tablet by mouth 2 (two) times daily between meals as needed for moderate constipation.  0   simvastatin (ZOCOR) 20 MG tablet Take 1 tablet (20 mg total) by mouth daily. 90 tablet 1   venlafaxine XR (EFFEXOR-XR) 150 MG 24 hr capsule TAKE ONE CAPSULE BY MOUTH DAILY AT BEDTIME 90 capsule 1   vitamin B-12 (CYANOCOBALAMIN) 1000 MCG tablet Take 1,000 mcg by mouth daily.     alfuzosin (UROXATRAL) 10 MG 24 hr tablet Take 10 mg by mouth daily. (Patient not taking: Reported on 03/26/2022)     0.9 %  sodium chloride infusion      No facility-administered medications prior to visit.    Allergies  Allergen Reactions   Molds & Smuts Other (See Comments)    Runny nose Itchy eyes    Pollen Extract Other (See Comments)    Runny nose Itchy  eyes      Review of Systems  Constitutional:  Positive for malaise/fatigue. Negative for fever.  HENT:  Negative for congestion.   Eyes:  Negative for blurred vision.  Respiratory:  Negative for shortness of breath.   Cardiovascular:  Negative for chest pain, palpitations and leg swelling.  Gastrointestinal:  Negative for abdominal pain, blood in stool and nausea.  Genitourinary:  Positive for frequency. Negative for dysuria and hematuria.  Musculoskeletal:  Negative for falls.  Skin:  Negative for rash.  Neurological:  Positive for  tremors. Negative for dizziness, loss of consciousness and headaches.  Endo/Heme/Allergies:  Negative for environmental allergies.  Psychiatric/Behavioral:  Negative for depression. The patient is not nervous/anxious.        Objective:    Physical Exam  BP 105/68 (BP Location: Right Arm, Patient Position: Sitting, Cuff Size: Normal)   Pulse 78   Temp 98 F (36.7 C) (Oral)   Resp 16   Ht '5\' 7"'$  (1.702 m)   Wt 179 lb (81.2 kg)   SpO2 98%   BMI 28.04 kg/m  Wt Readings from Last 3 Encounters:  05/14/22 179 lb (81.2 kg)  03/16/22 173 lb (78.5 kg)  03/07/22 168 lb (76.2 kg)    Diabetic Foot Exam - Simple   No data filed    Lab Results  Component Value Date   WBC 5.7 03/16/2022   HGB 12.8 (L) 03/16/2022   HCT 38.6 (L) 03/16/2022   PLT 186.0 03/16/2022   GLUCOSE 114 (H) 03/22/2022   CHOL 138 10/23/2021   TRIG 73.0 10/23/2021   HDL 54.00 10/23/2021   LDLDIRECT 76.0 12/31/2014   LDLCALC 69 10/23/2021   ALT 8 03/16/2022   AST 17 03/16/2022   NA 141 03/22/2022   K 4.2 03/22/2022   CL 104 03/22/2022   CREATININE 1.29 03/22/2022   BUN 21 03/22/2022   CO2 28 03/22/2022   TSH 2.25 10/23/2021   PSA 1.22 01/05/2021   INR 0.98 09/20/2014   HGBA1C 5.5 10/23/2021    Lab Results  Component Value Date   TSH 2.25 10/23/2021   Lab Results  Component Value Date   WBC 5.7 03/16/2022   HGB 12.8 (L) 03/16/2022   HCT 38.6 (L) 03/16/2022   MCV 97.3 03/16/2022   PLT 186.0 03/16/2022   Lab Results  Component Value Date   NA 141 03/22/2022   K 4.2 03/22/2022   CO2 28 03/22/2022   GLUCOSE 114 (H) 03/22/2022   BUN 21 03/22/2022   CREATININE 1.29 03/22/2022   BILITOT 0.5 03/16/2022   ALKPHOS 90 03/16/2022   AST 17 03/16/2022   ALT 8 03/16/2022   PROT 6.6 03/16/2022   ALBUMIN 4.3 03/16/2022   CALCIUM 9.4 03/22/2022   ANIONGAP 7 03/08/2022   GFR 53.37 (L) 03/22/2022   Lab Results  Component Value Date   CHOL 138 10/23/2021   Lab Results  Component Value Date   HDL  54.00 10/23/2021   Lab Results  Component Value Date   LDLCALC 69 10/23/2021   Lab Results  Component Value Date   TRIG 73.0 10/23/2021   Lab Results  Component Value Date   CHOLHDL 3 10/23/2021   Lab Results  Component Value Date   HGBA1C 5.5 10/23/2021       Assessment & Plan:   Problem List Items Addressed This Visit     Essential hypertension    Well controlled, no changes to meds. Encouraged heart healthy diet such as the DASH diet and exercise as tolerated.       Relevant Orders   CBC   Comprehensive metabolic panel  TSH   Hyperglycemia    hgba1c acceptable, minimize simple carbs. Increase exercise as tolerated.       Relevant Orders   Hemoglobin A1c   HLD (hyperlipidemia)    Tolerating statin, encouraged heart healthy diet, avoid trans fats, minimize simple carbs and saturated fats. Increase exercise as tolerated. Tolerating Simvastatin      Relevant Orders   Lipid panel   Parkinson's disease [facility])    Following with neurology and doing as well as he can. Frustrated but managing well on a day to day basis.        I have discontinued Forde Dandy alfuzosin. I am also having him maintain his multivitamins ther. w/minerals, Omega-3 Fatty Acids (OMEGA-3 PO), (Menthol, Topical Analgesic, (BIOFREEZE EX)), finasteride, pantoprazole, clopidogrel, albuterol, oxyCODONE, lidocaine, carbidopa-levodopa, clonazePAM, neomycin-polymyxin-hydrocortisone, cetirizine, fluticasone, Lidocaine, cyanocobalamin, Probiotic Product (PROBIOTIC PO), Vitamin C, Multiple Vitamins-Minerals (ZINC PO), acetaminophen, Polyethyl Glycol-Propyl Glycol (SYSTANE ULTRA OP), Naphazoline-Pheniramine (VISINE-A OP), polyethylene glycol powder, senna-docusate, gabapentin, meclizine, venlafaxine XR, and simvastatin. We will stop administering sodium chloride.  No orders of the defined types were placed in this encounter.    Penni Homans, MD

## 2022-05-16 DIAGNOSIS — N200 Calculus of kidney: Secondary | ICD-10-CM | POA: Diagnosis not present

## 2022-05-16 DIAGNOSIS — N202 Calculus of kidney with calculus of ureter: Secondary | ICD-10-CM | POA: Diagnosis not present

## 2022-05-17 ENCOUNTER — Telehealth: Payer: Self-pay | Admitting: Family Medicine

## 2022-05-17 NOTE — Telephone Encounter (Signed)
Pt called stated he had a fall on 8.29.23 and was unsure what to do. After speaking with Daleen Snook, who consulted with Mickel Baas, determined the best course of action was to transfer pt to triage line. Pt was transferred to triage line for further eval.

## 2022-05-18 ENCOUNTER — Ambulatory Visit (INDEPENDENT_AMBULATORY_CARE_PROVIDER_SITE_OTHER): Payer: Medicare Other | Admitting: Family Medicine

## 2022-05-18 ENCOUNTER — Encounter: Payer: Self-pay | Admitting: Family Medicine

## 2022-05-18 ENCOUNTER — Ambulatory Visit (HOSPITAL_BASED_OUTPATIENT_CLINIC_OR_DEPARTMENT_OTHER)
Admission: RE | Admit: 2022-05-18 | Discharge: 2022-05-18 | Disposition: A | Discharge status: Home / Self Care | Payer: Medicare Other | Source: Ambulatory Visit | Attending: Family Medicine | Admitting: Family Medicine

## 2022-05-18 VITALS — BP 122/84 | HR 81 | Temp 97.8°F | Resp 18 | Ht 67.0 in | Wt 176.8 lb

## 2022-05-18 DIAGNOSIS — S59902A Unspecified injury of left elbow, initial encounter: Secondary | ICD-10-CM | POA: Diagnosis not present

## 2022-05-18 DIAGNOSIS — M25522 Pain in left elbow: Secondary | ICD-10-CM | POA: Insufficient documentation

## 2022-05-18 NOTE — Patient Instructions (Signed)
Elbow Contusion An elbow contusion is a deep bruise of the elbow. Contusions are the result of a blunt injury to tissues and muscle fibers under the skin. The injury causes bleeding under the skin. The skin overlying the contusion may turn blue, purple, or yellow. Minor injuries will give you a painless contusion, but more severe contusions may stay painful and swollen for a few weeks. What are the causes? Common causes of this condition include: A hard hit to the elbow. An injury (trauma) to the elbow. Direct force on the elbow, such as from a fall. What increases the risk? You are more likely to develop this condition if you: Play sports or do other physical activities. Use blood thinners. What are the signs or symptoms? Symptoms of this condition include: Swelling of the elbow. Pain and tenderness of the elbow. Discoloration of the elbow. The area may have redness and then turn blue, purple, or yellow. How is this diagnosed? This condition is diagnosed based on: Your symptoms and medical history. A physical exam. You may also need an X-ray to determine if there are any associated injuries, such as broken bones (fractures). An MRI might be done if the swelling and pain do not go away in a few weeks. How is this treated? This condition may be treated with: Rest, ice, pressure (compression), and elevation. This is often called RICE therapy. In general, this is considered the best treatment for this condition. A sling or splint to support your injury. Over-the-counter anti-inflammatory medicines, such as ibuprofen, for pain control. Range-of-motion exercises. Follow these instructions at home: RICE therapy Rest the injured area. If directed, put ice on the injured area: If you have a removable sling or splint, remove it as told by your health care provider. Put ice in a plastic bag. Place a towel between your skin and the bag. Leave the ice on for 20 minutes, 2-3 times a day. If  directed, apply light compression to the injured area using an elastic bandage. Make sure the bandage is not wrapped too tightly. Remove and reapply the bandage as directed by your health care provider. Raise (elevate) the injured area above the level of your heart while you are sitting or lying down. If you have a sling or splint:  Wear the sling or splint as told by your health care provider. Remove it only as told by your health care provider. Loosen the sling or splint if your fingers tingle, become numb, or turn cold and blue. Keep the sling or splint clean. If the sling or splint is not waterproof: Do not let it get wet. Cover it with a watertight covering when you take a bath or a shower. General instructions Take over-the-counter and prescription medicines only as told by your health care provider. Return to your normal activities as told by your health care provider. Ask your health care provider what activities are safe for you. Do range-of-motion exercises only as told by your health care provider. Ask your health care provider when it is safe to drive if you have a sling or splint on your arm. Wear elbow pads as told by your health care provider. Keep all follow-up visits as told by your health care provider. This is important. Contact a health care provider if: Your symptoms do not improve after several days of treatment. You have more redness, swelling, or pain in your elbow. You have difficulty moving the injured area. Your swelling or pain is not relieved with medicines. Get help  right away if: Your skin over the contusion breaks and starts bleeding. You have severe pain. You have numbness in your hand or fingers. Your hand or fingers turn pale or cold. You have swelling of your hand and fingers. You cannot move your fingers or wrist. Summary An elbow contusion is a deep bruise of the elbow. Symptoms include pain, swelling, and discoloration of the elbow. Rest the  injured area and apply ice to the area as told by your health care provider. If directed, apply light compression to the injured area using an elastic bandage. Raise (elevate) the injured area above the level of your heart while you are sitting or lying down. This information is not intended to replace advice given to you by your health care provider. Make sure you discuss any questions you have with your health care provider. Document Revised: 12/01/2020 Document Reviewed: 12/01/2020 Elsevier Patient Education  [place].

## 2022-05-18 NOTE — Progress Notes (Signed)
Established Patient Office Visit  Subjective   Patient ID: Paul Drake, male    DOB: [birth year]  Age: 76 y.o. MRN: [id number]  Chief Complaint  Patient presents with   Fall    Pt had a fall on 05/15/2022 and having left elbow pain. Pt states he did not hit his head     HPI Pt is here c/o L elbow pain after a fall on 05/15/22.  No loc----  he was on his front stoop and backed up and lost his balance due to step and fell on his L elbow--- + bruising / pain  Patient Active Problem List   Diagnosis Date Noted   Aortic atherosclerosis ([place]) 03/08/2022   Hepatic steatosis 03/08/2022   SBO (small bowel obstruction) ([place]) 03/07/2022   Peripheral neuropathy 06/19/2021   Macrocytic anemia 06/19/2021   Ganglion cyst of volar aspect of left wrist 05/23/2021   Sinusitis 02/02/2021   Urinary frequency 06/27/2020   Finger pain, left 04/15/2020   Tinnitus of both ears 02/08/2020   Headache 02/08/2020   Parkinson's disease ([place]) 11/29/2019   Ganglion cyst of dorsum of left wrist 05/19/2018   Elevated testosterone level in male 05/19/2018   REM behavioral disorder 01/27/2018   Asthma 10/30/2017   Vertebrobasilar artery syndrome 07/29/2017   Left hand pain 07/21/2017   Chronic deep vein thrombosis (DVT) of popliteal vein of right lower extremity ([place]) 01/17/2017   TIA (transient ischemic attack) 09/26/2016   HLD (hyperlipidemia) 02/14/2016   Cerebrovascular accident (CVA) due to embolism of right middle cerebral artery ([place]) 08/15/2015   Medicare annual wellness visit, subsequent 06/26/2015   Eustachian tube dysfunction 03/11/2015   Hyperglycemia 01/06/2015   Allergic rhinitis 12/15/2014   Otitis media 12/15/2014   Vertigo 12/15/2014   PFO (patent foramen ovale) 11/24/2014   Essential hypertension 11/24/2014   DVT, lower extremity ([place]) 10/04/2014   Barrett's esophagus 08/16/2014   Lumbago 08/16/2014   IBS (irritable bowel syndrome) 08/16/2014   Abdominal pain 07/06/2014   Allergic  state 12/29/2013   Unspecified asthma(493.90) 12/29/2013   GERD (gastroesophageal reflux disease)    Need for prophylactic vaccination with combined diphtheria-tetanus-pertussis (DTP) vaccine 11/15/2013   BPH (benign prostatic hyperplasia) 11/15/2013   Renal lithiasis 11/15/2013   Depression with anxiety 11/15/2013   Anosmia 11/15/2013   History of CVA (cerebrovascular accident) 11/15/2013   [place] arthritis, thumb, degenerative 11/04/2013   Past Medical History:  Diagnosis Date   Allergic state 12/29/2013   Arthritis    Barrett's esophagus    BPH (benign prostatic hyperplasia)    Chicken pox    CVA (cerebral infarction)    Depression    GERD (gastroesophageal reflux disease)    History of kidney stones    HTN (hypertension), benign 04/01/2015   Hyperglycemia 01/06/2015   Hyperlipemia    Hypertension    IBS (irritable bowel syndrome)    Left hand pain 07/21/2017   Measles as a child   Medicare annual wellness visit, subsequent 06/26/2015   Mumps as a child   Otitis externa 12/10/2013   Seasonal allergies    some asthma   Stroke [facility])    Unspecified asthma(493.90) 12/29/2013   Past Surgical History:  Procedure Laterality Date   COLONOSCOPY     EP IMPLANTABLE DEVICE N/A 10/16/2016   Procedure: Loop Recorder Insertion;  Surgeon: Thompson Grayer, MD;  Location: [facility];  Service: Cardiovascular;  Laterality: N/A;   GANGLION CYST EXCISION Left 06/08/2021   Procedure: LEFT VOLAR GANGLION EXCISION;  Surgeon: Sherilyn Cooter, MD;  Location: [facility];  Service: Orthopedics;  Laterality: Left;   implantable loop recorder removal  09/21/2020   Reveal LINQ removed in office   LITHOTRIPSY     multiple times   PROSTATE SURGERY  08/2017   TEE WITHOUT CARDIOVERSION N/A 09/22/2014   Procedure: TRANSESOPHAGEAL ECHOCARDIOGRAM (TEE);  Surgeon: Josue Hector, MD;  Location[facility];  Service: Cardiovascular;  Laterality: N/A;   WISDOM TOOTH EXTRACTION     Social  History   Tobacco Use   Smoking status: Never   Smokeless tobacco: Never  Vaping Use   Vaping Use: Never used  Substance Use Topics   Alcohol use: Not Currently    Alcohol/week: 1.0 standard drink of alcohol    Types: 1 Shots of liquor per week    Comment: last drink 8-10 weeks ago.   Drug use: No   Social History   Socioeconomic History   Marital status: Married    Spouse name: Webb Silversmith   Number of children: 3   Years of education: college   Highest education level: Master's degree (e.g., MA, MS, MEng, MEd, MSW, MBA)  Occupational History   Occupation: retired    Comment: retired  Tobacco Use   Smoking status: Never   Smokeless tobacco: Never  Vaping Use   Vaping Use: Never used  Substance and Sexual Activity   Alcohol use: Not Currently    Alcohol/week: 1.0 standard drink of alcohol    Types: 1 Shots of liquor per week    Comment: last drink 8-10 weeks ago.   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 They are living in their daughter's house to help to help take care of their granddaughter.      Patient uses a home gym 3-4 x a week.      Social Determinants of Health   Financial Resource Strain: Low Risk  (08/01/2021)   Overall Financial Resource Strain (CARDIA)    Difficulty of Paying Living Expenses: Not hard at all  Food Insecurity: No Food Insecurity (08/01/2021)   Hunger Vital Sign    Worried About Running Out of Food in the Last Year: Never true    Ran Out of Food in the Last Year: Never true  Transportation Needs: No Transportation Needs (08/01/2021)   PRAPARE - Hydrologist (Medical): No    Lack of Transportation (Non-Medical): No  Physical Activity: Insufficiently Active (08/01/2021)   Exercise Vital  Sign    Days of Exercise per Week: 1 day    Minutes of Exercise per Session: 60 min  Stress: No Stress Concern Present (08/01/2021)   [place]    Feeling of Stress : Not at all  Social Connections: Moderately Isolated (08/01/2021)   Social Connection and Isolation Panel [NHANES]    Frequency of Communication with Friends and Family: More than three times a week    Frequency of Social Gatherings with Friends and Family: More than three times a week    Attends Religious Services: Never    Marine scientist or Organizations: No    Attends Archivist Meetings: Never    Marital  Status: Married  Human resources officer Violence: Not At Risk (08/01/2021)   Humiliation, Afraid, Rape, and Kick questionnaire    Fear of Current or Ex-Partner: No    Emotionally Abused: No    Physically Abused: No    Sexually Abused: No   Family Status  Relation Name Status   Mother 43 Deceased       73   Sister  [place]   Father  Deceased at age 50       traffic accident   Daughter [place]   Daughter [place]   Son [place]       1968/ healthy   MGF  Deceased at age 81's   MGM  Deceased at age 73's   [place]  Deceased   PGF  Deceased   Mat Aunt  Deceased   Mat Uncle  (Not Specified)   Family History  Problem Relation Age of Onset   Hypertension Mother    Hyperlipidemia Mother    Fibromyalgia Mother    Arthritis Mother        rheumatoid   Diabetes Sister        type 2   Hyperlipidemia Brother    Hypertension Brother    Prostate cancer Brother    Ulcers Father 36       Bleeding Ulcers   Kidney Stones Daughter    Asthma Daughter    Healthy Son    Cancer Maternal Grandfather        skin ?   Stroke Maternal Aunt    Cancer Maternal Uncle        prostate   Allergies  Allergen Reactions   Molds & Smuts Other (See Comments)    Runny  nose Itchy eyes    Pollen Extract Other (See Comments)    Runny nose Itchy  eyes        Review of Systems  Constitutional:  Negative for fever and malaise/fatigue.  HENT:  Negative for congestion.   Eyes:  Negative for blurred vision.  Respiratory:  Negative for shortness of breath.   Cardiovascular:  Negative for chest pain, palpitations and leg swelling.  Gastrointestinal:  Negative for abdominal pain, blood in stool and nausea.  Genitourinary:  Negative for dysuria and frequency.  Musculoskeletal:  Negative for falls.  Skin:  Negative for rash.  Neurological:  Negative for dizziness, loss of consciousness and headaches.  Endo/Heme/Allergies:  Negative for environmental allergies.  Psychiatric/Behavioral:  Negative for depression. The patient is not nervous/anxious.       Objective:     BP 122/84 (BP Location: Right Arm, Patient Position: Sitting, Cuff Size: Normal)   Pulse 81   Temp 97.8 F (36.6 C) (Oral)   Resp 18   Ht '5\' 7"'$  (1.702 m)   Wt 176 lb 12.8 oz (80.2 kg)   SpO2 96%   BMI 27.69 kg/m    Physical Exam Vitals and nursing note reviewed.  Constitutional:      Appearance: He is well-developed.  HENT:     Head: Normocephalic and atraumatic.  Eyes:     Pupils: Pupils are equal, round, and reactive to light.  Neck:     Thyroid: No thyromegaly.  Cardiovascular:     Rate and Rhythm: Normal rate and regular rhythm.     Heart  sounds: No murmur heard. Pulmonary:     Effort: Pulmonary effort is normal. No respiratory distress.     Breath sounds: Normal breath sounds. No wheezing or rales.  Chest:     Chest wall: No tenderness.  Musculoskeletal:        General: Swelling, tenderness and signs of injury present.     Right elbow: Normal.     Left elbow: Swelling present. Normal range of motion. Tenderness present.     Cervical back: Normal range of motion and neck supple.     Right hip: Tenderness present. Normal range of motion. Normal strength.     Left  hip: Tenderness present. Normal range of motion. Normal strength.     Right foot: Bony tenderness present. No swelling.     Left foot: Bony tenderness present. No swelling.  Skin:    General: Skin is warm and dry.     Findings: Bruising and erythema present.  Neurological:     Mental Status: He is alert and oriented to person, place, and time.  Psychiatric:        Behavior: Behavior normal.        Thought Content: Thought content normal.        Judgment: Judgment normal.      No results found for any visits on 05/18/22.    The ASCVD Risk score (Arnett DK, et al., 2019) failed to calculate for the following reasons:   The patient has a prior MI or stroke diagnosis    Assessment & Plan:   Problem List Items Addressed This Visit   None Visit Diagnoses     Left elbow pain    -  Primary   Relevant Orders   DG Elbow Complete Left (Completed)       Return if symptoms worsen or fail to improve.    Ann Held, DO

## 2022-05-18 NOTE — Telephone Encounter (Signed)
Called pt and he stated he doing ok,  just arm is bruised and just want to make sure there is  Nothing broken. He has appt today with Dr.Lowne

## 2022-05-18 NOTE — Assessment & Plan Note (Signed)
Check xray  Pt did not want sling / wrap con't tylenol prn Ice/ heat  rto or call prn

## 2022-05-23 ENCOUNTER — Other Ambulatory Visit: Payer: Self-pay | Admitting: Neurology

## 2022-05-28 DIAGNOSIS — N202 Calculus of kidney with calculus of ureter: Secondary | ICD-10-CM | POA: Diagnosis not present

## 2022-06-08 ENCOUNTER — Other Ambulatory Visit (HOSPITAL_BASED_OUTPATIENT_CLINIC_OR_DEPARTMENT_OTHER): Payer: Self-pay

## 2022-06-08 DIAGNOSIS — N200 Calculus of kidney: Secondary | ICD-10-CM | POA: Diagnosis not present

## 2022-06-08 DIAGNOSIS — R350 Frequency of micturition: Secondary | ICD-10-CM | POA: Diagnosis not present

## 2022-06-08 DIAGNOSIS — N401 Enlarged prostate with lower urinary tract symptoms: Secondary | ICD-10-CM | POA: Diagnosis not present

## 2022-06-08 DIAGNOSIS — Z23 Encounter for immunization: Secondary | ICD-10-CM | POA: Diagnosis not present

## 2022-06-08 MED ORDER — FLUAD QUADRIVALENT 0.5 ML IM PRSY
PREFILLED_SYRINGE | INTRAMUSCULAR | 0 refills | Status: DC
  Filled 2022-06-08: qty 0.5, 1d supply, fill #0

## 2022-06-13 ENCOUNTER — Other Ambulatory Visit: Payer: Self-pay | Admitting: Urology

## 2022-06-14 ENCOUNTER — Other Ambulatory Visit (HOSPITAL_COMMUNITY): Payer: Self-pay | Admitting: Urology

## 2022-06-14 DIAGNOSIS — N200 Calculus of kidney: Secondary | ICD-10-CM

## 2022-07-02 ENCOUNTER — Telehealth: Payer: Self-pay | Admitting: Anesthesiology

## 2022-07-02 MED ORDER — CLONAZEPAM 1 MG PO TABS: 0.5000 mg | ORAL_TABLET | Freq: Every day | ORAL | 2 refills | Status: DC

## 2022-07-02 NOTE — Telephone Encounter (Signed)
Sent to [facility] on [address].

## 2022-07-02 NOTE — Telephone Encounter (Signed)
Called patient and made him aware

## 2022-07-02 NOTE — Telephone Encounter (Signed)
Patient is not able get prescription Clonazepam 0.5 mg filled in any pharmacy. Patient was told by pharmacy he needs new prescription for 1 mg and he can cut it in half to take the 0.5 mg. Patient requesting new prescription. Patient is out of his medication.

## 2022-07-03 ENCOUNTER — Other Ambulatory Visit: Payer: Self-pay | Admitting: Family Medicine

## 2022-07-06 NOTE — Patient Instructions (Signed)
SURGICAL WAITING ROOM VISITATION Patients having surgery or a procedure may have no more than 2 support people in the waiting area - these visitors may rotate.   Children under the age of 33 must have an adult with them who is not the patient. If the patient needs to stay at the hospital during part of their recovery, the visitor guidelines for inpatient rooms apply. Pre-op nurse will coordinate an appropriate time for 1 support person to accompany patient in pre-op.  This support person may not rotate.    Please refer to the [facility] website for the visitor guidelines for Inpatients (after your surgery is over and you are in a regular room).      Your procedure is scheduled on: 07-24-22   Report to [facility] Main Entrance    Report to admitting at 9:15 AM   Call this number if you have problems the morning of surgery [phone]   Do not eat food or drink liquids :After Midnight.          If you have questions, please contact your surgeon's office.   FOLLOW ANY ADDITIONAL PRE OP INSTRUCTIONS YOU RECEIVED FROM YOUR SURGEON'S OFFICE!!!     Oral Hygiene is also important to reduce your risk of infection.                                    Remember - BRUSH YOUR TEETH THE MORNING OF SURGERY WITH YOUR REGULAR TOOTHPASTE   Do NOT smoke after Midnight   Take these medicines the morning of surgery with A SIP OF WATER: Carbidopa-Levodopa Zyrtec Finasteride Gabapentin Pantoprazole Simvastatin Okay to use inhalers Tylenol if needed Meclizine if needed   DO NOT TAKE ANY ORAL DIABETIC MEDICATIONS DAY OF YOUR SURGERY  Bring CPAP mask and tubing day of surgery.                              You may not have any metal on your body including jewelry, and body piercing             Do not wear lotions, powders, cologne, or deodorant              Men may shave face and neck.   Do not bring valuables to the hospital. [place].   Contacts, dentures or bridgework may not be worn into surgery.   Bring small overnight bag day of surgery.   DO NOT [place]. PHARMACY WILL DISPENSE MEDICATIONS LISTED ON YOUR MEDICATION LIST TO YOU DURING YOUR ADMISSION [place]!    Special Instructions: Bring a copy of your healthcare power of attorney and living will documents the day of surgery if you haven't scanned them before.              Please read over the following fact sheets you were given: IF [place]  If you received a COVID test during your pre-op visit  it is requested that you wear a mask when out in public, stay away from anyone that may not be feeling well and notify your surgeon if you develop symptoms. If you test positive for Covid or have been in contact with anyone that has tested positive in the last  10 days please notify you surgeon.  [place] - Preparing for Surgery Before surgery, you can play an important role.  Because skin is not sterile, your skin needs to be as free of germs as possible.  You can reduce the number of germs on your skin by washing with CHG (chlorahexidine gluconate) soap before surgery.  CHG is an antiseptic cleaner which kills germs and bonds with the skin to continue killing germs even after washing. Please DO NOT use if you have an allergy to CHG or antibacterial soaps.  If your skin becomes reddened/irritated stop using the CHG and inform your nurse when you arrive at Short Stay. Do not shave (including legs and underarms) for at least 48 hours prior to the first CHG shower.  You may shave your face/neck.  Please follow these instructions carefully:  1.  Shower with CHG Soap the night before surgery and the  morning of surgery.  2.  If you choose to wash your hair, wash your hair first as usual with your normal  shampoo.  3.  After you shampoo, rinse your hair  and body thoroughly to remove the shampoo.                             4.  Use CHG as you would any other liquid soap.  You can apply chg directly to the skin and wash.  Gently with a scrungie or clean washcloth.  5.  Apply the CHG Soap to your body ONLY FROM THE NECK DOWN.   Do   not use on face/ open                           Wound or open sores. Avoid contact with eyes, ears mouth and   genitals (private parts).                       Wash face,  Genitals (private parts) with your normal soap.             6.  Wash thoroughly, paying special attention to the area where your    surgery  will be performed.  7.  Thoroughly rinse your body with warm water from the neck down.  8.  DO NOT shower/wash with your normal soap after using and rinsing off the CHG Soap.                9.  Pat yourself dry with a clean towel.            10.  Wear clean pajamas.            11.  Place clean sheets on your bed the night of your first shower and do not  sleep with pets. Day of Surgery : Do not apply any lotions/deodorants the morning of surgery.  Please wear clean clothes to the hospital/surgery center.  FAILURE TO FOLLOW THESE INSTRUCTIONS MAY RESULT IN THE CANCELLATION OF YOUR SURGERY  PATIENT SIGNATURE_________________________________  NURSE SIGNATURE__________________________________  ________________________________________________________________________

## 2022-07-06 NOTE — Progress Notes (Signed)
COVID Vaccine Completed:  Yes  Date of COVID positive in last 90 days:  PCP - Penni Homans, MD Cardiologist -  Electrophysiologist - Thompson Grayer, MD Neurologist - Alonza Bogus, DO  Chest x-ray -  EKG - 03-09-22 Epic Stress Test -  ECHO - 2016 Epic Cardiac Cath -  Pacemaker/ICD device last checked: Spinal Cord Stimulator: Loop Recorder - removed 2022  Bowel Prep -   Sleep Study -  CPAP -   Fasting Blood Sugar -  Checks Blood Sugar _____ times a day  Blood Thinner Instructions:  Plavix Aspirin Instructions: Last Dose:  Activity level:  Can go up a flight of stairs and perform activities of daily living without stopping and without symptoms of chest pain or shortness of breath.  Able to exercise without symptoms  Unable to go up a flight of stairs without symptoms of     Anesthesia review: PFO, HTN, hx of CVA, Parkinson's disease  Patient denies shortness of breath, fever, cough and chest pain at PAT appointment  Patient verbalized understanding of instructions that were given to them at the PAT appointment. Patient was also instructed that they will need to review over the PAT instructions again at home before surgery.

## 2022-07-09 ENCOUNTER — Other Ambulatory Visit (HOSPITAL_BASED_OUTPATIENT_CLINIC_OR_DEPARTMENT_OTHER): Payer: Self-pay

## 2022-07-09 DIAGNOSIS — Z23 Encounter for immunization: Secondary | ICD-10-CM | POA: Diagnosis not present

## 2022-07-09 MED ORDER — AREXVY 120 MCG/0.5ML IM SUSR
INTRAMUSCULAR | 0 refills | Status: DC
  Filled 2022-07-09: qty 0.5, 1d supply, fill #0

## 2022-07-09 MED ORDER — FLUAD QUADRIVALENT 0.5 ML IM PRSY
PREFILLED_SYRINGE | INTRAMUSCULAR | 0 refills | Status: DC
  Filled 2022-07-09: qty 0.5, 1d supply, fill #0

## 2022-07-09 MED ORDER — COMIRNATY 30 MCG/0.3ML IM SUSY
PREFILLED_SYRINGE | INTRAMUSCULAR | 0 refills | Status: DC
  Filled 2022-07-09: qty 0.3, 1d supply, fill #0

## 2022-07-10 ENCOUNTER — Encounter: Payer: Self-pay | Admitting: *Deleted

## 2022-07-11 ENCOUNTER — Encounter (HOSPITAL_COMMUNITY): Payer: Self-pay

## 2022-07-11 ENCOUNTER — Encounter (HOSPITAL_COMMUNITY)
Admission: RE | Admit: 2022-07-11 | Discharge: 2022-07-11 | Disposition: A | Discharge status: Home / Self Care | Payer: Medicare Other | Source: Ambulatory Visit | Attending: Urology | Admitting: Urology

## 2022-07-11 ENCOUNTER — Other Ambulatory Visit: Payer: Self-pay

## 2022-07-11 VITALS — BP 138/93 | HR 72 | Temp 97.9°F | Resp 18 | Ht 67.5 in | Wt 175.8 lb

## 2022-07-11 DIAGNOSIS — K769 Liver disease, unspecified: Secondary | ICD-10-CM | POA: Diagnosis not present

## 2022-07-11 DIAGNOSIS — Z01818 Encounter for other preprocedural examination: Secondary | ICD-10-CM | POA: Insufficient documentation

## 2022-07-11 DIAGNOSIS — N200 Calculus of kidney: Secondary | ICD-10-CM | POA: Diagnosis not present

## 2022-07-11 HISTORY — DX: Patent foramen ovale: Q21.12

## 2022-07-11 HISTORY — DX: Unspecified malignant neoplasm of skin of unspecified part of face: C44.300

## 2022-07-11 HISTORY — DX: Parkinson's disease without dyskinesia, without mention of fluctuations: G20.A1

## 2022-07-11 HISTORY — DX: Anxiety disorder, unspecified: F41.9

## 2022-07-11 LAB — COMPREHENSIVE METABOLIC PANEL
ALT: <5 U/L (ref 0–44)
AST: 18 U/L (ref 15–41)
Albumin: 4.1 g/dL (ref 3.5–5.0)
Alkaline Phosphatase: 71 U/L (ref 38–126)
Anion gap: 7 (ref 5–15)
BUN: 20 mg/dL (ref 8–23)
CO2: 27 mmol/L (ref 22–32)
Calcium: 9 mg/dL (ref 8.9–10.3)
Chloride: 104 mmol/L (ref 98–111)
Creatinine, Ser: 1.24 mg/dL (ref 0.61–1.24)
GFR, Estimated: 60 mL/min — ABNORMAL LOW (ref >60)
Glucose, Bld: 108 mg/dL — ABNORMAL HIGH (ref 70–99)
Potassium: 4.5 mmol/L (ref 3.5–5.1)
Sodium: 138 mmol/L (ref 135–145)
Total Bilirubin: 0.8 mg/dL (ref 0.3–1.2)
Total Protein: 7.2 g/dL (ref 6.5–8.1)

## 2022-07-11 LAB — CBC
HCT: 40.4 % (ref 39.0–52.0)
Hemoglobin: 13 g/dL (ref 13.0–17.0)
MCH: 32.1 pg (ref 26.0–34.0)
MCHC: 32.2 g/dL (ref 30.0–36.0)
MCV: 99.8 fL (ref 80.0–100.0)
Platelets: 173 10*3/uL (ref 150–400)
RBC: 4.05 MIL/uL — ABNORMAL LOW (ref 4.22–5.81)
RDW: 12.3 % (ref 11.5–15.5)
WBC: 6.3 10*3/uL (ref 4.0–10.5)
nRBC: 0 % (ref 0.0–0.2)

## 2022-07-12 ENCOUNTER — Other Ambulatory Visit: Payer: Self-pay | Admitting: Family Medicine

## 2022-07-12 ENCOUNTER — Encounter (HOSPITAL_COMMUNITY): Payer: Self-pay | Admitting: Physician Assistant

## 2022-07-13 ENCOUNTER — Other Ambulatory Visit: Payer: Self-pay | Admitting: Family Medicine

## 2022-07-17 NOTE — Progress Notes (Unsigned)
Assessment/Plan:   1.  Parkinsons Disease  -Continue carbidopa/levodopa 25/100, 2 at 7am, 1 at 10:30, 1 at 2 pm, 1 at 5:30 pm  -exercise  -He follows regularly with dermatology.    2.  RBD  -Continue clonazepam 0.5 mg at bedtime (last time we had to fill a 1 mg tablet and have him cut it in half because pharmacy was out of the 0.5 mg -there was a English as a second language teacher)  -Despite clonazepam, the patient had a very serious incident where he jumped out of the bed while acting out a dream and hit his forehead on the ground.  We discussed safety in the bedroom. He did lower his bed to platform bed.  I would like him to use bed rails.  -pt quit drinking after one event of falling OOB, and he feels he is doing better.  Proud of him for doing so   Subjective:   Paul Drake was seen today in follow up for Parkinsons disease.  My previous records were reviewed prior to todays visit as well as outside records available to me.  Patient's levodopa was increased last visit.  He tolerated that well.  We referred him to [facility], but the patient never went.  They called him for the appointment and patient stated that he was having inner ear problems that were causing some balance trouble and he decided to cancel the appointment.  He was in the emergency room and admitted to the hospital in June for small bowel obstruction.  This did not require surgical management.  Patient did see his primary care on September 1 after a fall a few days earlier.  He was outside on his front porch and backed up and lost balance and fell on the left elbow.  He did not hit his head.  Current prescribed movement disorder medications: Carbidopa/levodopa 25/100, 2 at 7am, 1 at 10:30, 1 at 2 pm, 1 at 5:30 pm (increased last visit) Clonazepam 0.5 mg at bedtime   ALLERGIES:   Allergies  Allergen Reactions   Molds & Smuts Other (See Comments)    Runny nose Itchy eyes    Pollen Extract Other (See Comments)     Runny nose Itchy  eyes      CURRENT MEDICATIONS:  Outpatient Encounter Medications as of 07/19/2022  Medication Sig   acetaminophen (TYLENOL) 500 MG tablet Take 1,000 mg by mouth daily as needed for mild pain or headache.   albuterol (VENTOLIN HFA) 108 (90 Base) MCG/ACT inhaler Inhale 2 puffs into the lungs every 6 (six) hours as needed for wheezing or shortness of breath.   Ascorbic Acid (VITAMIN C) 500 MG CHEW Chew 500 mg by mouth daily.   carbidopa-levodopa (SINEMET IR) 25-100 MG tablet 2 at 7am, 1 at 10:30, 1 at 2 pm, 1 at 5:30 pm (Patient taking differently: Take 1-2 tablets by mouth See admin instructions. 2 tablets at 07:00, 1 tablet at 10:30, 1 tablet at 14:00, 1 tablet at 17:30)   cetirizine (ZYRTEC) 10 MG tablet Take 10 mg by mouth daily.   clonazePAM (KLONOPIN) 1 MG tablet Take 0.5 tablets (0.5 mg total) by mouth at bedtime.   clopidogrel (PLAVIX) 75 MG tablet TAKE 1 TABLET(75 MG) BY MOUTH DAILY   COVID-19 mRNA vaccine 2023-2024 (COMIRNATY) syringe Inject into the muscle.   finasteride (PROSCAR) 5 MG tablet Take 5 mg by mouth daily.   fluticasone (FLONASE) 50 MCG/ACT nasal spray Place 1 spray into both nostrils daily as needed  for allergies.   gabapentin (NEURONTIN) 100 MG capsule Take 1 capsule (100 mg total) by mouth every 4 (four) hours as needed.   influenza vaccine adjuvanted (FLUAD QUADRIVALENT) 0.5 ML injection Inject into the muscle.   Menthol, Topical Analgesic, (BIOFREEZE EX) Apply 1 application topically daily as needed (hand pain).   Multiple Vitamins-Minerals (MULTIVITAMINS THER. W/MINERALS) TABS tablet Take 1 tablet by mouth daily.   Naphazoline-Pheniramine (VISINE-A OP) Place 1 drop into both eyes 2 (two) times daily as needed (itchy eyes).   Omega-3 Fatty Acids (OMEGA-3 PO) Take 1 capsule by mouth daily.   oxyCODONE (ROXICODONE) 5 MG immediate release tablet Take 1 tablet (5 mg total) by mouth every 4 (four) hours as needed for severe pain.   pantoprazole  (PROTONIX) 40 MG tablet Take 1 tablet (40 mg total) by mouth daily.   Polyethyl Glycol-Propyl Glycol (SYSTANE ULTRA OP) Place 1 drop into both eyes 2 (two) times daily as needed (dry eyes).   Probiotic Product (PROBIOTIC PO) Take 1 capsule by mouth daily.   RSV vaccine recomb adjuvanted (AREXVY) 120 MCG/0.5ML injection Inject into the muscle.   senna-docusate (SENOKOT-S) 8.6-50 MG tablet Take 1 tablet by mouth 2 (two) times daily between meals as needed for moderate constipation.   simethicone (MYLICON) 643 MG chewable tablet Chew 125 mg by mouth as needed (Bloating).   simvastatin (ZOCOR) 20 MG tablet Take 1 tablet (20 mg total) by mouth daily.   venlafaxine XR (EFFEXOR-XR) 150 MG 24 hr capsule TAKE 1 CAPSULE BY MOUTH DAILY AT BEDTIME   vitamin B-12 (CYANOCOBALAMIN) 1000 MCG tablet Take 1,000 mcg by mouth daily.   zinc gluconate 50 MG tablet Take 50 mg by mouth daily.   No facility-administered encounter medications on file as of 07/19/2022.    Objective:   PHYSICAL EXAMINATION:    VITALS:   There were no vitals filed for this visit.     GEN:  The patient appears stated age and is in NAD. HEENT:  Normocephalic, atraumatic.  The mucous membranes are moist. The superficial temporal arteries are without ropiness or tenderness. CV:  RRR Lungs:  CTAB Neck/HEME:  There are no carotid bruits bilaterally.  Neurological examination:  Orientation: The patient is alert and oriented x3. Cranial nerves: There is good facial symmetry with minimal facial hypomimia. The speech is fluent and clear. Soft palate rises symmetrically and there is no tongue deviation. Hearing is intact to conversational tone. Sensation: Sensation is intact to light touch throughout Motor: Strength is at least antigravity x4.  Movement examination: Tone: There is mild to mod increased tone in the RUE and mild in the LUE Abnormal movements: none today Coordination:  There is mild with hand opening and closing  bilaterally, R>L Gait and Station: The patient has no difficulty arising out of a deep-seated chair without the use of the hands. The patient's stride length is tenuous  I have reviewed and interpreted the following labs independently    Chemistry      Component Value Date/Time   NA 138 07/11/2022 1037   K 4.5 07/11/2022 1037   CL 104 07/11/2022 1037   CO2 27 07/11/2022 1037   BUN 20 07/11/2022 1037   CREATININE 1.24 07/11/2022 1037   CREATININE 1.38 (H) 07/04/2020 1004      Component Value Date/Time   CALCIUM 9.0 07/11/2022 1037   ALKPHOS 71 07/11/2022 1037   AST 18 07/11/2022 1037   ALT <5 07/11/2022 1037   BILITOT 0.8 07/11/2022 1037  Lab Results  Component Value Date   WBC 6.3 07/11/2022   HGB 13.0 07/11/2022   HCT 40.4 07/11/2022   MCV 99.8 07/11/2022   PLT 173 07/11/2022    Lab Results  Component Value Date   TSH 1.66 05/14/2022   Total time spent on today's visit was *** minutes, including both face-to-face time and nonface-to-face time.  Time included that spent on review of records (prior notes available to me/labs/imaging if pertinent), discussing treatment and goals, answering patient's questions and coordinating care.    Cc:  Mosie Lukes, MD

## 2022-07-19 ENCOUNTER — Other Ambulatory Visit: Payer: Self-pay

## 2022-07-19 ENCOUNTER — Encounter: Payer: Self-pay | Admitting: Neurology

## 2022-07-19 ENCOUNTER — Ambulatory Visit (INDEPENDENT_AMBULATORY_CARE_PROVIDER_SITE_OTHER): Payer: Medicare Other | Admitting: Neurology

## 2022-07-19 VITALS — BP 119/78 | HR 78 | Ht 67.0 in | Wt 177.8 lb

## 2022-07-19 DIAGNOSIS — G20A1 Parkinson's disease without dyskinesia, without mention of fluctuations: Secondary | ICD-10-CM

## 2022-07-19 MED ORDER — CLONAZEPAM 0.5 MG PO TABS: 0.5000 mg | ORAL_TABLET | Freq: Two times a day (BID) | ORAL | 1 refills | Status: DC | PRN

## 2022-07-19 NOTE — Patient Instructions (Signed)
Local and Online Resources for Power over Parkinson's Group  October 2023    LOCAL [place] PARKINSON'S GROUPS   Power over Parkinson's Group:    Power Over Parkinson's Patient Education Group will be Wednesday, October 11th-*Hybrid meting*- in person at [facility] location and via [facility] at 2 pm.   Upcoming Power over [employer] Meetings:  2nd Wednesdays of the month at 2 pm:   October 11th, November 8th, December 13th  Contact Amy Marriott at [email or web address] if interested in participating in this group    [place]! Moves [employer] Instructor-Led Classes offering at [employer]!  TUESDAYS and Wednesdays 1-2 pm.   Contact Vonna Kotyk at  [email or web address] or Caron Presume at [place], [email or web address]  Dance for Parkinson 's classes will be on Tuesdays 9:30am-10:30am starting October 3-December 12 with a break the week of November 21st. Located in the [employer]  which is in the first floor of the [employer] ([place].) To register:  [email or web address] or [phone]  Drumming for Parkinson's will be held on 2nd and 4th Mondays at 11:00 am.   Located at the [place] ([place]  [place] at [email or web address] or [phone]  Through support from the [place] for Parkinson's classes are free for both patients and caregivers.    [employer] Parkinson's Tai Chi Class, Mondays at 11 am.  Call [phone] for details   [place]:  www.[email or web address]  PD Health at Home continues:  Mindfulness Mondays, Wellness Wednesdays, Fitness Fridays   Upcoming Education:    Parkinson's 101:  What you and your family should know.  Wednesday, Oct. 4th 1-2 pm  Expert Briefing:     Parkinson's and the Gut-Brain Connection.   Wednesday, Oct. 11th 1-2 pm  Hallucinations and Delusions in Parkinson's.  Wednesday, Nov. 8th, 1-2 pm  Register for expert briefings (webinars) at [email or web address]  Please check out their website to sign up for emails and see their full online offerings      [place]:  [email or web address]   Third Thursday Webinars:  On the third Thursday of every month at 12 p.m. ET, join our free live webinars to learn about various aspects of living with Parkinson's disease and our work to speed medical breakthroughs.  Upcoming Webinar:  Surveyor, mining for [employer]. (Replay).  Thursday, Oct. 12th at 12 noon  Check out additional information on their website to see their full online offerings    [place]:  [email or web address]  Upcoming Webinar:   Stay tuned  Webinar Series:  Living with Parkinson's Meetup.   Third Thursdays each month, 3 pm  Care Partner Monthly Meetup.  With Robin Searing Phinney.  First Tuesday of each month, 2 pm  Check out additional information to Live Well Today on their website    Parkinson and Movement Disorders (PMD) Alliance:  www.[email or web address]  [employer] Online:  Online Education Events  Sign up for emails, which are sent weekly to give you updates on programming and online offerings    Parkinson's Association of the [place]:  www.[email or web address]  Information on online support groups, education events, and online exercises including Yoga, Parkinson's exercises and more-LOTS of information on links to PD resources and online events  Virtual Support Group through Parkinson's Association of the [place]; next one is scheduled for Wednesday, October 4th at 2 pm.  (  These are typically scheduled for the 1st Wednesday of the month at 2 pm).  Visit website for details.   MOVEMENT AND EXERCISE OPPORTUNITIES  PWR! Moves Classes  at [place].  Wednesdays 10 and 11 am.   Contact Amy Marriott, PT [email or web address] if interested.  NEW PWR! Moves Class offerings at [employer].  *TUESDAYS* and Wednesdays 1-2 pm.  Contact Vonna Kotyk at  [email or web address] or Caron Presume at [place],  [email or web address]  Parkinson's Wellness Recovery (PWR! Moves)  www.[email or web address]  Info on the PWR! Virtual Experience:  You will have access to our expertise?through self-assessment, guided plans that start with the PD-specific fundamentals, educational content, tips, Q&A with an expert, and a growing Art therapist of PD-specific pre-recorded and live exercise classes of varying types and intensity - both physical and cognitive! If that is not enough, we offer 1:1 wellness consultations (in-person or virtual) to personalize your PWR! Research scientist (medical).   [place] Fridays:   As part of the PD Health @ Home program, this free video series focuses each week on one aspect of fitness designed to support people living with Parkinson's.? These weekly videos highlight the Bennett fitness guidelines for people with Parkinson's disease.  [email or web address]  Dance for PD website is offering free, live-stream classes throughout the week, as well as links to [employer] of classes:  [email or web address]  Virtual dance and Pilates for Parkinson's classes: Click on the Community Tab> Parkinson's Movement Initiative Tab.  To register for classes and for more information, visit [email or web address] and click the "community" tab.   [employer] Parkinson's Cycling Classes   [employer]:  Thursdays @ Noon-Live classes at [employer] ([employer] at [email or web address]?or [phone])  [facility]: Virtual Classes Mondays and Thursdays Jeanette Caprice classes Tuesday, Wednesday and Thursday (contact [place] at  [email or web address] ?or [phone])  [place]  Varied levels of classes are offered Tuesdays and Thursdays at [employer].   Stretching with Verdis Frederickson weekly class is also offered for people with Parkinson's  To observe a class or for more information, call [phone] or email Hezzie Bump at [email or web address]   ADDITIONAL SUPPORT AND RESOURCES  Well-Spring [employer]:Online Caregiver Education Opportunities:  www.well-springsolutions.org/caregiver-education/caregiver-support-group.  You may also contact Vickki Muff at [email or web address] or [phone].     Well-Spring Navigator:  Just1Navigator program, a?free service to help individuals and families through the journey of determining care for older adults.  The "Navigator" is a Education officer, museum, Arnell Asal, who will speak with a prospective client and/or loved ones to provide an assessment of the situation and a set of recommendations for a personalized care plan -- all free of charge, and whether?Well-Spring Solutions offers the needed service or not. If the need is not a service we provide, we are well-connected with reputable programs in town that we can refer you to.  www.well-springsolutions.org or to speak with the Navigator, call [phone].

## 2022-07-23 ENCOUNTER — Other Ambulatory Visit: Payer: Self-pay | Admitting: Student

## 2022-07-23 DIAGNOSIS — N200 Calculus of kidney: Secondary | ICD-10-CM

## 2022-07-24 ENCOUNTER — Inpatient Hospital Stay (HOSPITAL_COMMUNITY): Admission: RE | Admit: 2022-07-24 | Payer: Medicare Other | Source: Home / Self Care | Admitting: Urology

## 2022-07-24 ENCOUNTER — Encounter (HOSPITAL_COMMUNITY): Admission: RE | Payer: Self-pay | Source: Home / Self Care

## 2022-07-24 ENCOUNTER — Encounter (HOSPITAL_COMMUNITY): Payer: Self-pay

## 2022-07-24 ENCOUNTER — Other Ambulatory Visit (HOSPITAL_COMMUNITY): Payer: Medicare Other

## 2022-07-24 ENCOUNTER — Ambulatory Visit (HOSPITAL_COMMUNITY): Payer: Medicare Other

## 2022-07-24 DIAGNOSIS — K769 Liver disease, unspecified: Secondary | ICD-10-CM

## 2022-07-24 DIAGNOSIS — Z01818 Encounter for other preprocedural examination: Secondary | ICD-10-CM

## 2022-07-24 SURGERY — NEPHROLITHOTOMY PERCUTANEOUS
Anesthesia: General | Laterality: Right

## 2022-07-25 ENCOUNTER — Other Ambulatory Visit: Payer: Self-pay | Admitting: Family Medicine

## 2022-08-01 ENCOUNTER — Encounter: Payer: Self-pay | Admitting: Family Medicine

## 2022-08-01 ENCOUNTER — Ambulatory Visit (INDEPENDENT_AMBULATORY_CARE_PROVIDER_SITE_OTHER): Payer: Medicare Other | Admitting: Family Medicine

## 2022-08-01 VITALS — BP 120/80 | HR 76 | Temp 97.6°F | Ht 67.0 in | Wt 178.2 lb

## 2022-08-01 DIAGNOSIS — M67432 Ganglion, left wrist: Secondary | ICD-10-CM

## 2022-08-01 MED ORDER — METHYLPREDNISOLONE ACETATE 40 MG/ML IJ SUSP
20.0000 mg | Freq: Once | INTRAMUSCULAR | Status: AC
  Administered 2022-08-01: 20 mg via INTRA_ARTICULAR

## 2022-08-01 NOTE — Progress Notes (Signed)
Chief Complaint  Patient presents with   Cyst    left    Zeric Baranowski is a 76 y.o. male here for a skin complaint. He is left handed.   Duration: 1 month Location: L wrist Pruritic? No Painful? Yes Drainage? No +Hx of ganglion cysts Other associated symptoms: no redness or fevers Therapies tried thus far: activity modification, Biofreeze  Past Medical History:  Diagnosis Date   Allergic state 12/29/2013   Anxiety    Arthritis    Barrett's esophagus    BPH (benign prostatic hyperplasia)    Chicken pox    CVA (cerebral infarction)    Depression    GERD (gastroesophageal reflux disease)    History of kidney stones    HTN (hypertension), benign 04/01/2015   Hyperglycemia 01/06/2015   Hyperlipemia    Hypertension    IBS (irritable bowel syndrome)    Left hand pain 07/21/2017   Measles as a child   Medicare annual wellness visit, subsequent 06/26/2015   Mumps as a child   Otitis externa 12/10/2013   Parkinson disease    PFO (patent foramen ovale)    Seasonal allergies    some asthma   Skin cancer of face    Stroke ([place])    Unspecified asthma(493.90) 12/29/2013    BP 120/80 (BP Location: Right Arm, Patient Position: Sitting, Cuff Size: Normal)   Pulse 76   Temp 97.6 F (36.4 C) (Oral)   Ht '5\' 7"'$  (1.702 m)   Wt 178 lb 4 oz (80.9 kg)   SpO2 98%   BMI 27.92 kg/m  Gen: awake, alert, appearing stated age Lungs: No accessory muscle use Skin: 1.5 cm circular and raised lesion over latera volar aspect of the wrist; +ttp, flesh colored, no excessive warmth. No drainage, erythema, fluctuance, excoriation Psych: Age appropriate judgment and insight  Procedure note: Ganglion cyst aspiration/injection Verbal consent obtained The area was cleaned with an alcohol swab and topical anesthesia spray was used. A 25 g needle was used to enter the cyst and aspirate. Venous blood aspirated initially.  This was discarded and the needle was inserted into a new area.  No blood was  aspirated.  No significant amount of gelatinous fluid was aspirated. 20 mg of Depo-Medrol and 0.5 cc of 2% lidocaine without epinephrine was then injected. A Band-Aid was placed. The patient tolerated this well. There were no other immediate complications noted.  Ganglion cyst of dorsum of left wrist - Plan: Ambulatory referral to Hand Surgery, methylPREDNISolone acetate (DEPO-MEDROL) injection 20 mg, PR ASPIRAT/INJECTION GANGLION CYST(S)  Aspiration and steroid injection today. Ice, Tylenol, activity as tolerated. Will refer to hand team at back up as this has recurred.  F/u prn. The patient voiced understanding and agreement to the plan.  [place], DO 08/01/22 12:04 PM

## 2022-08-01 NOTE — Patient Instructions (Signed)
OK to take Tylenol 1000 mg (2 extra strength tabs) or 975 mg (3 regular strength tabs) every 6 hours as needed.  Ice/cold pack over area for 10-15 min twice daily.  If you do not hear anything about your referral in the next 1-2 weeks, call our office and ask for an update.  Let us know if you need anything.

## 2022-08-03 ENCOUNTER — Ambulatory Visit: Payer: Medicare Other

## 2022-08-07 ENCOUNTER — Encounter: Payer: Self-pay | Admitting: Orthopaedic Surgery

## 2022-08-07 ENCOUNTER — Ambulatory Visit (INDEPENDENT_AMBULATORY_CARE_PROVIDER_SITE_OTHER): Payer: Medicare Other | Admitting: Orthopaedic Surgery

## 2022-08-07 ENCOUNTER — Ambulatory Visit (INDEPENDENT_AMBULATORY_CARE_PROVIDER_SITE_OTHER): Payer: Medicare Other | Admitting: Family Medicine

## 2022-08-07 VITALS — BP 126/82 | HR 71 | Temp 97.6°F | Resp 16 | Ht 67.0 in | Wt 177.4 lb

## 2022-08-07 DIAGNOSIS — M67432 Ganglion, left wrist: Secondary | ICD-10-CM

## 2022-08-07 DIAGNOSIS — R739 Hyperglycemia, unspecified: Secondary | ICD-10-CM

## 2022-08-07 DIAGNOSIS — R351 Nocturia: Secondary | ICD-10-CM | POA: Diagnosis not present

## 2022-08-07 DIAGNOSIS — R7989 Other specified abnormal findings of blood chemistry: Secondary | ICD-10-CM | POA: Diagnosis not present

## 2022-08-07 DIAGNOSIS — N401 Enlarged prostate with lower urinary tract symptoms: Secondary | ICD-10-CM

## 2022-08-07 DIAGNOSIS — I10 Essential (primary) hypertension: Secondary | ICD-10-CM | POA: Diagnosis not present

## 2022-08-07 DIAGNOSIS — E785 Hyperlipidemia, unspecified: Secondary | ICD-10-CM

## 2022-08-07 DIAGNOSIS — R3911 Hesitancy of micturition: Secondary | ICD-10-CM | POA: Diagnosis not present

## 2022-08-07 LAB — URINALYSIS
Bilirubin Urine: NEGATIVE
Hgb urine dipstick: NEGATIVE
Ketones, ur: NEGATIVE
Leukocytes,Ua: NEGATIVE
Nitrite: NEGATIVE
Specific Gravity, Urine: 1.015 (ref 1.000–1.030)
Total Protein, Urine: NEGATIVE
Urine Glucose: NEGATIVE
Urobilinogen, UA: 0.2 (ref 0.0–1.0)
pH: 7 (ref 5.0–8.0)

## 2022-08-07 LAB — PSA: PSA: 1.77 ng/mL (ref 0.10–4.00)

## 2022-08-07 LAB — TESTOSTERONE: Testosterone: 1103.31 ng/dL — ABNORMAL HIGH (ref 300.00–890.00)

## 2022-08-07 NOTE — Patient Instructions (Addendum)
We checked PSA in April 2022 was 1.22  Miralax with Benefiber fiber powder daily to twice daily in a a beverage

## 2022-08-07 NOTE — Progress Notes (Signed)
Office Visit Note   Patient: Paul Drake           Date of Birth: June 06, 1946           MRN: [id number] Visit Date: 08/07/2022              Requested by: Paul Drake, [address],  [place] [address] PCP: Paul Lukes, MD   Assessment & Plan: Visit Diagnoses:  1. Ganglion cyst of volar aspect of left wrist     Plan: We discussed that even with surgical excision there is about a 5 to 10% chance of recurrence.  For now sounds like he is able to live with the cyst and the symptoms are tolerable.  He will follow-up if this gets worse.  Otherwise follow-up as needed.  Follow-Up Instructions: No follow-ups on file.   Orders:  No orders of the defined types were placed in this encounter.  No orders of the defined types were placed in this encounter.     Procedures: No procedures performed   Clinical Data: No additional findings.   Subjective: Chief Complaint  Patient presents with   Left Wrist - Pain    HPI Paul Drake is a 76 year old gentleman previous patient of Dr. Madelynn Drake for a left volar wrist ganglion cyst.  Dr. Tempie Drake removed surgically in September 2022.  Unfortunately has come back.  His PCP tried to drain it but was unsuccessful.  Patient has also had 2 strokes that affected his left side.  He is now in his left hand because of it.  He is able to play golf when he puts an Ace bandage around it.  Review of Systems   Objective: Vital Signs: There were no vitals taken for this visit.  Physical Exam  Ortho Exam Examination of the left wrist shows a fully healed surgical scar.  There is a small recurrent volar ganglion cyst.  He has good range of motion of the wrist.  The mass is minimally tender. Specialty Comments:  No specialty comments available.  Imaging: No results found.   PMFS History: Patient Active Problem List   Diagnosis Date Noted   Left elbow pain 05/18/2022   Aortic atherosclerosis ([place])  03/08/2022   Hepatic steatosis 03/08/2022   SBO (small bowel obstruction) ([place]) 03/07/2022   Peripheral neuropathy 06/19/2021   Macrocytic anemia 06/19/2021   Ganglion cyst of volar aspect of left wrist 05/23/2021   Sinusitis 02/02/2021   Urinary frequency 06/27/2020   Finger pain, left 04/15/2020   Tinnitus of both ears 02/08/2020   Headache 02/08/2020   Parkinson's disease 11/29/2019   Ganglion cyst of dorsum of left wrist 05/19/2018   Elevated testosterone level in male 05/19/2018   REM behavioral disorder 01/27/2018   Asthma 10/30/2017   Vertebrobasilar artery syndrome 07/29/2017   Left hand pain 07/21/2017   Chronic deep vein thrombosis (DVT) of popliteal vein of right lower extremity ([place]) 01/17/2017   TIA (transient ischemic attack) 09/26/2016   HLD (hyperlipidemia) 02/14/2016   Cerebrovascular accident (CVA) due to embolism of right middle cerebral artery ([place]) 08/15/2015   Medicare annual wellness visit, subsequent 06/26/2015   Eustachian tube dysfunction 03/11/2015   Hyperglycemia 01/06/2015   Allergic rhinitis 12/15/2014   Otitis media 12/15/2014   Vertigo 12/15/2014   PFO (patent foramen ovale) 11/24/2014   Essential hypertension 11/24/2014   DVT, lower extremity ([place]) 10/04/2014   Barrett's esophagus 08/16/2014   Lumbago 08/16/2014   IBS (irritable bowel syndrome)  08/16/2014   Abdominal pain 07/06/2014   Allergic state 12/29/2013   Unspecified asthma(493.90) 12/29/2013   GERD (gastroesophageal reflux disease)    Need for prophylactic vaccination with combined diphtheria-tetanus-pertussis (DTP) vaccine 11/15/2013   BPH (benign prostatic hyperplasia) 11/15/2013   Renal lithiasis 11/15/2013   Depression with anxiety 11/15/2013   Anosmia 11/15/2013   History of CVA (cerebrovascular accident) 11/15/2013   [place] arthritis, thumb, degenerative 11/04/2013   Past Medical History:  Diagnosis Date   Allergic state 12/29/2013   Anxiety    Arthritis    Barrett's  esophagus    BPH (benign prostatic hyperplasia)    Chicken pox    CVA (cerebral infarction)    Depression    GERD (gastroesophageal reflux disease)    History of kidney stones    HTN (hypertension), benign 04/01/2015   Hyperglycemia 01/06/2015   Hyperlipemia    Hypertension    IBS (irritable bowel syndrome)    Left hand pain 07/21/2017   Measles as a child   Medicare annual wellness visit, subsequent 06/26/2015   Mumps as a child   Otitis externa 12/10/2013   Parkinson disease    PFO (patent foramen ovale)    Seasonal allergies    some asthma   Skin cancer of face    Stroke ([place])    Unspecified asthma(493.90) 12/29/2013    Family History  Problem Relation Age of Onset   Hypertension Mother    Hyperlipidemia Mother    Fibromyalgia Mother    Arthritis Mother        rheumatoid   Diabetes Sister        type 2   Hyperlipidemia Brother    Hypertension Brother    Prostate cancer Brother    Ulcers Father 53       Bleeding Ulcers   Kidney Stones Daughter    Asthma Daughter    Healthy Son    Cancer Maternal Grandfather        skin ?   Stroke Maternal Aunt    Cancer Maternal Uncle        prostate    Past Surgical History:  Procedure Laterality Date   COLONOSCOPY     EP IMPLANTABLE DEVICE N/A 10/16/2016   Procedure: Loop Recorder Insertion;  Surgeon: Paul Grayer, MD;  Location: [facility];  Service: Cardiovascular;  Laterality: N/A;   GANGLION CYST EXCISION Left 06/08/2021   Procedure: LEFT VOLAR GANGLION EXCISION;  Surgeon: Paul Cooter, MD;  Location: [facility];  Service: Orthopedics;  Laterality: Left;   implantable loop recorder removal  09/21/2020   Reveal LINQ removed in office   LITHOTRIPSY     multiple times   PROSTATE SURGERY  08/2017   TEE WITHOUT CARDIOVERSION N/A 09/22/2014   Procedure: TRANSESOPHAGEAL ECHOCARDIOGRAM (TEE);  Surgeon: Paul Hector, MD;  [facility];  Service: Cardiovascular;  Laterality: N/A;   WISDOM  TOOTH EXTRACTION     Social History   Occupational History   Occupation: retired    Comment: retired  Tobacco Use   Smoking status: Never   Smokeless tobacco: Never  Vaping Use   Vaping Use: Never used  Substance and Sexual Activity   Alcohol use: Yes    Alcohol/week: 1.0 standard drink of alcohol    Types: 1 Shots of liquor per week    Comment: social   Drug use: No   Sexual activity: Yes    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

## 2022-08-07 NOTE — Progress Notes (Signed)
Subjective:   By signing my name below, I, Kellie Simmering, attest that this documentation has been prepared under the direction and in the presence of Mosie Lukes, MD., 08/07/2022.     Patient ID: Paul Drake, male    DOB: 30-May-1946, 76 y.o.   MRN: [id number]  Chief Complaint  Patient presents with   Follow-up    Follow up   HPI Patient is in today for an office visit.  Bowel Habits Patient uses Miralax to help his bowel movements. He states that he took Senekot but this was ineffective.   FHx His brother had prostate cancer.  Ganglion Cyst (Left Wrist) Patient complains of a ganglion cyst on the dorsum of his left wrist. He experiences pain when playing golf or doing big movements. He has been wrapping the wrist which provides temporary relief. He was seen by Dr. Nani Ravens on 08/01/2022 who aspirated and injected the left wrist with a steroid. This did not provide relief and patient was referred to hand surgery.   Liver Disease Patient reports that he was scheduled to undergo percutaneous nephrolithotomy surgery on 07/24/2022 at [facility], but cancelled due to uncomfortableness. He still sees urologist Dr. Jacalyn Lefevre at [facility]. He has now scheduled a consult with Dr. Sherol Dade at [facility] on 08/14/2022 for his surgery. He complains of urinary frequency and urgency.   Parkinson's Disease He has been walking daily and reports this has helped him feel better about his Parkinson's disease.   Denies CP/ palp/ SOB/ HA/  congestion/fevers/chills.  Immunization History  Administered Date(s) Administered   COVID-19, mRNA, vaccine(Comirnaty)12 years and older 07/09/2022   Fluad Quad(high Dose 65+) 06/17/2019, 06/23/2020, 06/08/2022   Influenza, High Dose Seasonal PF 06/23/2017, 07/01/2018   Influenza,inj,Quad PF,6+ Mos 05/17/2014, 05/20/2015, 07/05/2016   Influenza-Unspecified 06/17/2013, 06/05/2021   PFIZER Comirnaty(Gray Top)Covid-19 Tri-Sucrose Vaccine 01/24/2021    PFIZER(Purple Top)SARS-COV-2 Vaccination 10/02/2019, 10/23/2019, 05/26/2020   Pfizer Covid-19 Vaccine Bivalent Booster 70yr & up 06/05/2021, 02/06/2022   Pneumococcal Conjugate-13 10/15/2016   Pneumococcal-Unspecified 06/17/2013   Respiratory Syncytial Virus Vaccine,Recomb Aduvanted(Arexvy) 07/09/2022   Tdap 11/11/2013, 11/26/2021   Zoster Recombinat (Shingrix) 06/19/2021, 09/13/2021   Past Medical History:  Diagnosis Date   Allergic state 12/29/2013   Anxiety    Arthritis    Barrett's esophagus    BPH (benign prostatic hyperplasia)    Chicken pox    CVA (cerebral infarction)    Depression    GERD (gastroesophageal reflux disease)    History of kidney stones    HTN (hypertension), benign 04/01/2015   Hyperglycemia 01/06/2015   Hyperlipemia    Hypertension    IBS (irritable bowel syndrome)    Left hand pain 07/21/2017   Measles as a child   Medicare annual wellness visit, subsequent 06/26/2015   Mumps as a child   Otitis externa 12/10/2013   Parkinson disease    PFO (patent foramen ovale)    Seasonal allergies    some asthma   Skin cancer of face    Stroke ([facility]    Unspecified asthma(493.90) 12/29/2013    Past Surgical History:  Procedure Laterality Date   COLONOSCOPY     EP IMPLANTABLE DEVICE N/A 10/16/2016   Procedure: Loop Recorder Insertion;  Surgeon: JThompson Grayer MD;  [facility];  Service: Cardiovascular;  Laterality: N/A;   GANGLION CYST EXCISION Left 06/08/2021   Procedure: LEFT VOLAR GANGLION EXCISION;  Surgeon: BSherilyn Cooter MD;  Location: [facility]  Service: Orthopedics;  Laterality: Left;  implantable loop recorder removal  09/21/2020   Reveal LINQ removed in office   LITHOTRIPSY     multiple times   PROSTATE SURGERY  08/2017   TEE WITHOUT CARDIOVERSION N/A 09/22/2014   Procedure: TRANSESOPHAGEAL ECHOCARDIOGRAM (TEE);  Surgeon: Josue Hector, MD;  Location: [facility] ENDOSCOPY;  Service: Cardiovascular;  Laterality: N/A;    WISDOM TOOTH EXTRACTION      Family History  Problem Relation Age of Onset   Hypertension Mother    Hyperlipidemia Mother    Fibromyalgia Mother    Arthritis Mother        rheumatoid   Diabetes Sister        type 2   Hyperlipidemia Brother    Hypertension Brother    Prostate cancer Brother    Ulcers Father 69       Bleeding Ulcers   Kidney Stones Daughter    Asthma Daughter    Healthy Son    Cancer Maternal Grandfather        skin ?   Stroke Maternal Aunt    Cancer Maternal Uncle        prostate    Social History   Socioeconomic History   Marital status: Married    Spouse name: Webb Silversmith   Number of children: 3   Years of education: college   Highest education level: Master's degree (e.g., MA, MS, MEng, MEd, MSW, MBA)  Occupational History   Occupation: retired    Comment: retired  Tobacco Use   Smoking status: Never   Smokeless tobacco: Never  Vaping Use   Vaping Use: Never used  Substance and Sexual Activity   Alcohol use: Yes    Alcohol/week: 1.0 standard drink of alcohol    Types: 1 Shots of liquor per week    Comment: social   Drug use: No   Sexual activity: Yes    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 They are living in their daughter's house to help to help take care of their granddaughter.      Patient uses a home gym 3-4 x a week.      Social Determinants of Health   Financial Resource Strain: Low Risk  (08/01/2021)   Overall Financial Resource Strain (CARDIA)    Difficulty of Paying Living Expenses: Not hard at all  Food Insecurity: No Food Insecurity (08/01/2021)   Hunger Vital Sign    Worried About Running Out of Food in the Last Year: Never true    Ran Out of Food in the Last Year: Never true  Transportation Needs: No Transportation Needs (08/01/2021)    PRAPARE - Hydrologist (Medical): No    Lack of Transportation (Non-Medical): No  Physical Activity: Insufficiently Active (08/01/2021)   Exercise Vital Sign    Days of Exercise per Week: 1 day    Minutes of Exercise per Session: 60 min  Stress: No Stress Concern Present (08/01/2021)   [place]    Feeling of Stress : Not at all  Social Connections: Moderately Isolated (08/01/2021)   Social Connection and Isolation Panel [NHANES]    Frequency of Communication with Friends and Family: More than three times a week    Frequency of Social Gatherings with Friends and  Family: More than three times a week    Attends Religious Services: Never    Active Member of Clubs or Organizations: No    Attends Archivist Meetings: Never    Marital Status: Married  Human resources officer Violence: Not At Risk (08/01/2021)   Humiliation, Afraid, Rape, and Kick questionnaire    Fear of Current or Ex-Partner: No    Emotionally Abused: No    Physically Abused: No    Sexually Abused: No    Outpatient Medications Prior to Visit  Medication Sig Dispense Refill   acetaminophen (TYLENOL) 500 MG tablet Take 1,000 mg by mouth daily as needed for mild pain or headache.     albuterol (VENTOLIN HFA) 108 (90 Base) MCG/ACT inhaler Inhale 2 puffs into the lungs every 6 (six) hours as needed for wheezing or shortness of breath. 1 each 3   Ascorbic Acid (VITAMIN C) 500 MG CHEW Chew 500 mg by mouth daily.     carbidopa-levodopa (SINEMET IR) 25-100 MG tablet 2 at 7am, 1 at 10:30, 1 at 2 pm, 1 at 5:30 pm (Patient taking differently: Take 1-2 tablets by mouth See admin instructions. 2 tablets at 07:00, 1 tablet at 10:30, 1 tablet at 14:00, 1 tablet at 17:30) 450 tablet 1   cetirizine (ZYRTEC) 10 MG tablet Take 10 mg by mouth daily.     clonazePAM (KLONOPIN) 0.5 MG tablet Take 1 tablet (0.5 mg total) by mouth 2 (two) times  daily as needed for anxiety. 60 tablet 1   clopidogrel (PLAVIX) 75 MG tablet TAKE 1 TABLET(75 MG) BY MOUTH DAILY 90 tablet 3   COVID-19 mRNA vaccine 2023-2024 (COMIRNATY) syringe Inject into the muscle. 0.3 mL 0   finasteride (PROSCAR) 5 MG tablet Take 5 mg by mouth daily.     fluticasone (FLONASE) 50 MCG/ACT nasal spray Place 1 spray into both nostrils daily as needed for allergies.     gabapentin (NEURONTIN) 100 MG capsule Take 1 capsule (100 mg total) by mouth every 4 (four) hours as needed. 90 capsule 1   influenza vaccine adjuvanted (FLUAD QUADRIVALENT) 0.5 ML injection Inject into the muscle. 0.5 mL 0   Menthol, Topical Analgesic, (BIOFREEZE EX) Apply 1 application topically daily as needed (hand pain).     Multiple Vitamins-Minerals (MULTIVITAMINS THER. W/MINERALS) TABS tablet Take 1 tablet by mouth daily.     Naphazoline-Pheniramine (VISINE-A OP) Place 1 drop into both eyes 2 (two) times daily as needed (itchy eyes).     Omega-3 Fatty Acids (OMEGA-3 PO) Take 1 capsule by mouth daily.     oxyCODONE (ROXICODONE) 5 MG immediate release tablet Take 1 tablet (5 mg total) by mouth every 4 (four) hours as needed for severe pain. 5 tablet 0   pantoprazole (PROTONIX) 40 MG tablet Take 1 tablet (40 mg total) by mouth daily. 90 tablet 0   Polyethyl Glycol-Propyl Glycol (SYSTANE ULTRA OP) Place 1 drop into both eyes 2 (two) times daily as needed (dry eyes).     Probiotic Product (PROBIOTIC PO) Take 1 capsule by mouth daily.     RSV vaccine recomb adjuvanted (AREXVY) 120 MCG/0.5ML injection Inject into the muscle. 0.5 mL 0   senna-docusate (SENOKOT-S) 8.6-50 MG tablet Take 1 tablet by mouth 2 (two) times daily between meals as needed for moderate constipation.  0   simethicone (MYLICON) 678 MG chewable tablet Chew 125 mg by mouth as needed (Bloating).     simvastatin (ZOCOR) 20 MG tablet TAKE 1 TABLET(20 MG) BY MOUTH DAILY 90  tablet 1   venlafaxine XR (EFFEXOR-XR) 150 MG 24 hr capsule TAKE 1 CAPSULE BY  MOUTH DAILY AT BEDTIME 90 capsule 1   vitamin B-12 (CYANOCOBALAMIN) 1000 MCG tablet Take 1,000 mcg by mouth daily.     zinc gluconate 50 MG tablet Take 50 mg by mouth daily.     No facility-administered medications prior to visit.    Allergies  Allergen Reactions   Molds & Smuts Other (See Comments)    Runny nose Itchy eyes    Pollen Extract Other (See Comments)    Runny nose Itchy  eyes      Review of Systems  Constitutional:  Negative for chills and fever.  HENT:  Negative for congestion.   Respiratory:  Negative for shortness of breath.   Cardiovascular:  Negative for chest pain and palpitations.  Gastrointestinal:  Positive for constipation. Negative for abdominal pain, blood in stool, diarrhea, nausea and vomiting.  Genitourinary:  Positive for frequency and urgency.  Skin:        (+) Ganglion cyst on dorsum of left wrist.  Neurological:  Negative for headaches.      Objective:    Physical Exam Constitutional:      General: He is not in acute distress.    Appearance: Normal appearance. He is normal weight. He is not ill-appearing.  HENT:     Head: Normocephalic and atraumatic.     Right Ear: External ear normal.     Left Ear: External ear normal.     Nose: Nose normal.     Mouth/Throat:     Mouth: Mucous membranes are moist.     Pharynx: Oropharynx is clear.  Eyes:     General:        Right eye: No discharge.        Left eye: No discharge.     Extraocular Movements: Extraocular movements intact.     Pupils: Pupils are equal, round, and reactive to light.  Cardiovascular:     Rate and Rhythm: Normal rate and regular rhythm.     Pulses: Normal pulses.     Heart sounds: Normal heart sounds. No murmur heard.    No gallop.  Pulmonary:     Effort: Pulmonary effort is normal. No respiratory distress.     Breath sounds: Normal breath sounds. No wheezing or rales.  Abdominal:     General: Bowel sounds are normal.     Tenderness: There is no abdominal  tenderness. There is no guarding.  Musculoskeletal:        General: Normal range of motion.     Cervical back: Normal range of motion.     Right lower leg: No edema.     Left lower leg: No edema.  Skin:    General: Skin is warm and dry.  Neurological:     Mental Status: He is alert and oriented to person, place, and time.  Psychiatric:        Mood and Affect: Mood normal.        Behavior: Behavior normal.        Judgment: Judgment normal.     BP 126/82 (BP Location: Right Arm, Patient Position: Sitting, Cuff Size: Normal)   Pulse 71   Temp 97.6 F (36.4 C) (Oral)   Resp 16   Ht '5\' 7"'$  (1.702 m)   Wt 177 lb 6.4 oz (80.5 kg)   SpO2 98%   BMI 27.78 kg/m  Wt Readings from Last 3 Encounters:  08/07/22 177 lb  6.4 oz (80.5 kg)  08/01/22 178 lb 4 oz (80.9 kg)  07/19/22 177 lb 12.8 oz (80.6 kg)    Diabetic Foot Exam - Simple   No data filed    Lab Results  Component Value Date   WBC 6.3 07/11/2022   HGB 13.0 07/11/2022   HCT 40.4 07/11/2022   PLT 173 07/11/2022   GLUCOSE 108 (H) 07/11/2022   CHOL 151 05/14/2022   TRIG 97.0 05/14/2022   HDL 64.20 05/14/2022   LDLDIRECT 76.0 12/31/2014   LDLCALC 67 05/14/2022   ALT <5 07/11/2022   AST 18 07/11/2022   NA 138 07/11/2022   K 4.5 07/11/2022   CL 104 07/11/2022   CREATININE 1.24 07/11/2022   BUN 20 07/11/2022   CO2 27 07/11/2022   TSH 1.66 05/14/2022   PSA 1.77 08/07/2022   INR 0.98 09/20/2014   HGBA1C 5.8 05/14/2022    Lab Results  Component Value Date   TSH 1.66 05/14/2022   Lab Results  Component Value Date   WBC 6.3 07/11/2022   HGB 13.0 07/11/2022   HCT 40.4 07/11/2022   MCV 99.8 07/11/2022   PLT 173 07/11/2022   Lab Results  Component Value Date   NA 138 07/11/2022   K 4.5 07/11/2022   CO2 27 07/11/2022   GLUCOSE 108 (H) 07/11/2022   BUN 20 07/11/2022   CREATININE 1.24 07/11/2022   BILITOT 0.8 07/11/2022   ALKPHOS 71 07/11/2022   AST 18 07/11/2022   ALT <5 07/11/2022   PROT 7.2 07/11/2022    ALBUMIN 4.1 07/11/2022   CALCIUM 9.0 07/11/2022   ANIONGAP 7 07/11/2022   GFR 55.91 (L) 05/14/2022   Lab Results  Component Value Date   CHOL 151 05/14/2022   Lab Results  Component Value Date   HDL 64.20 05/14/2022   Lab Results  Component Value Date   LDLCALC 67 05/14/2022   Lab Results  Component Value Date   TRIG 97.0 05/14/2022   Lab Results  Component Value Date   CHOLHDL 2 05/14/2022   Lab Results  Component Value Date   HGBA1C 5.8 05/14/2022      Assessment & Plan:   PSA: Last completed on 01/05/2021. 1.22 ng/mL.  Problem List Items Addressed This Visit     BPH (benign prostatic hyperplasia) - Primary   Essential hypertension    Well controlled, no changes to meds. Encouraged heart healthy diet such as the DASH diet and exercise as tolerated.        Hyperglycemia    hgba1c acceptable, minimize simple carbs. Increase exercise as tolerated.       HLD (hyperlipidemia)    Encourage heart healthy diet such as MIND or DASH diet, increase exercise, avoid trans fats, simple carbohydrates and processed foods, consider a krill or fish or flaxseed oil cap daily.        Ganglion cyst of dorsum of left wrist    Hs returned he is going to try and manage it conservatively this time      Elevated testosterone level in male    Placed referral to urology      Nocturia   Relevant Orders   Testosterone (Completed)   PSA (Completed)   Urinalysis (Completed)   Urine Culture   Other Visit Diagnoses     Urinary hesitancy       Relevant Orders   Testosterone (Completed)   PSA (Completed)   Urinalysis (Completed)   Urine Culture      No orders  of the defined types were placed in this encounter.  I, Penni Homans, MD, personally preformed the services described in this documentation.  All medical record entries made by the scribe were at my direction and in my presence.  I have reviewed the chart and discharge instructions (if applicable) and agree that the  record reflects my personal performance and is accurate and complete. 08/07/2022  I,Mohammed Iqbal,acting as a scribe for Penni Homans, MD.,have documented all relevant documentation on the behalf of Penni Homans, MD,as directed by  Penni Homans, MD while in the presence of Penni Homans, MD.  Penni Homans, MD

## 2022-08-08 ENCOUNTER — Other Ambulatory Visit: Payer: Self-pay | Admitting: Family Medicine

## 2022-08-08 ENCOUNTER — Other Ambulatory Visit: Payer: Self-pay

## 2022-08-08 DIAGNOSIS — R351 Nocturia: Secondary | ICD-10-CM | POA: Insufficient documentation

## 2022-08-08 DIAGNOSIS — R7989 Other specified abnormal findings of blood chemistry: Secondary | ICD-10-CM

## 2022-08-08 DIAGNOSIS — I10 Essential (primary) hypertension: Secondary | ICD-10-CM

## 2022-08-08 DIAGNOSIS — E785 Hyperlipidemia, unspecified: Secondary | ICD-10-CM

## 2022-08-08 DIAGNOSIS — R972 Elevated prostate specific antigen [PSA]: Secondary | ICD-10-CM

## 2022-08-08 DIAGNOSIS — R739 Hyperglycemia, unspecified: Secondary | ICD-10-CM

## 2022-08-08 LAB — URINE CULTURE
MICRO NUMBER:: 14218576
Result:: NO GROWTH
SPECIMEN QUALITY:: ADEQUATE

## 2022-08-08 NOTE — Assessment & Plan Note (Signed)
Well controlled, no changes to meds. Encouraged heart healthy diet such as the DASH diet and exercise as tolerated.  °

## 2022-08-08 NOTE — Assessment & Plan Note (Signed)
Hs returned he is going to try and manage it conservatively this time

## 2022-08-08 NOTE — Assessment & Plan Note (Signed)
hgba1c acceptable, minimize simple carbs. Increase exercise as tolerated.  

## 2022-08-08 NOTE — Assessment & Plan Note (Signed)
Encourage heart healthy diet such as MIND or DASH diet, increase exercise, avoid trans fats, simple carbohydrates and processed foods, consider a krill or fish or flaxseed oil cap daily.  °

## 2022-08-08 NOTE — Assessment & Plan Note (Addendum)
Placed referral to urology.

## 2022-08-13 ENCOUNTER — Ambulatory Visit (INDEPENDENT_AMBULATORY_CARE_PROVIDER_SITE_OTHER): Payer: Medicare Other | Admitting: *Deleted

## 2022-08-13 DIAGNOSIS — Z0000 Encounter for general adult medical examination without abnormal findings: Secondary | ICD-10-CM | POA: Diagnosis not present

## 2022-08-13 NOTE — Progress Notes (Signed)
Subjective:   Paul Drake is a 76 y.o. male who presents for Medicare Annual/Subsequent preventive examination.  I connected with  Paul Drake on 08/13/22 by a audio enabled telemedicine application and verified that I am speaking with the correct person using two identifiers.  Patient Location: Home  Provider Location: Office/Clinic  I discussed the limitations of evaluation and management by telemedicine. The patient expressed understanding and agreed to proceed.   Review of Systems    Defer to PCP Cardiac Risk Factors include: advanced age (>19mn, >>8women);hypertension;dyslipidemia     Objective:    There were no vitals filed for this visit. There is no height or weight on file to calculate BMI.     08/13/2022    9:02 AM 07/19/2022   10:32 AM 07/11/2022   10:52 AM 03/26/2022    3:36 PM 03/07/2022    1:39 PM 03/07/2022    4:04 AM 03/04/2022   11:08 AM  Advanced Directives  Does Patient Have a Medical Advance Directive? Yes Yes Yes Yes Yes Yes Yes  Type of AParamedicof [place] will Living will [place] ShoreLiving will HSociety HillLiving will Healthcare Power of [place]will  Does patient want to make changes to medical advance directive? No - Patient declined   No - Patient declined No - Patient declined No - Patient declined   Copy of HPajaroin Chart? No - copy requested  No - copy requested  No - copy requested No - copy requested     Current Medications (verified) Outpatient Encounter Medications as of 08/13/2022  Medication Sig   acetaminophen (TYLENOL) 500 MG tablet Take 1,000 mg by mouth daily as needed for mild pain or headache.   albuterol (VENTOLIN HFA) 108 (90 Base) MCG/ACT inhaler Inhale 2 puffs into the lungs every 6 (six) hours as needed for wheezing or shortness of breath.   Ascorbic Acid (VITAMIN C) 500 MG CHEW Chew 500 mg by  mouth daily.   carbidopa-levodopa (SINEMET IR) 25-100 MG tablet 2 at 7am, 1 at 10:30, 1 at 2 pm, 1 at 5:30 pm (Patient taking differently: Take 1-2 tablets by mouth See admin instructions. 2 tablets at 07:00, 1 tablet at 10:30, 1 tablet at 14:00, 1 tablet at 17:30)   cetirizine (ZYRTEC) 10 MG tablet Take 10 mg by mouth daily.   clonazePAM (KLONOPIN) 0.5 MG tablet Take 1 tablet (0.5 mg total) by mouth 2 (two) times daily as needed for anxiety.   clopidogrel (PLAVIX) 75 MG tablet TAKE 1 TABLET(75 MG) BY MOUTH DAILY   COVID-19 mRNA vaccine 2023-2024 (COMIRNATY) syringe Inject into the muscle.   finasteride (PROSCAR) 5 MG tablet Take 5 mg by mouth daily.   fluticasone (FLONASE) 50 MCG/ACT nasal spray Place 1 spray into both nostrils daily as needed for allergies.   gabapentin (NEURONTIN) 100 MG capsule Take 1 capsule (100 mg total) by mouth every 4 (four) hours as needed.   influenza vaccine adjuvanted (FLUAD QUADRIVALENT) 0.5 ML injection Inject into the muscle.   Menthol, Topical Analgesic, (BIOFREEZE EX) Apply 1 application topically daily as needed (hand pain).   Multiple Vitamins-Minerals (MULTIVITAMINS THER. W/MINERALS) TABS tablet Take 1 tablet by mouth daily.   Naphazoline-Pheniramine (VISINE-A OP) Place 1 drop into both eyes 2 (two) times daily as needed (itchy eyes).   Omega-3 Fatty Acids (OMEGA-3 PO) Take 1 capsule by mouth daily.   oxyCODONE (ROXICODONE) 5 MG immediate release tablet  Take 1 tablet (5 mg total) by mouth every 4 (four) hours as needed for severe pain.   pantoprazole (PROTONIX) 40 MG tablet Take 1 tablet (40 mg total) by mouth daily.   Polyethyl Glycol-Propyl Glycol (SYSTANE ULTRA OP) Place 1 drop into both eyes 2 (two) times daily as needed (dry eyes).   Probiotic Product (PROBIOTIC PO) Take 1 capsule by mouth daily.   RSV vaccine recomb adjuvanted (AREXVY) 120 MCG/0.5ML injection Inject into the muscle.   senna-docusate (SENOKOT-S) 8.6-50 MG tablet Take 1 tablet by mouth 2  (two) times daily between meals as needed for moderate constipation.   simethicone (MYLICON) 427 MG chewable tablet Chew 125 mg by mouth as needed (Bloating).   simvastatin (ZOCOR) 20 MG tablet TAKE 1 TABLET(20 MG) BY MOUTH DAILY   venlafaxine XR (EFFEXOR-XR) 150 MG 24 hr capsule TAKE 1 CAPSULE BY MOUTH DAILY AT BEDTIME   vitamin B-12 (CYANOCOBALAMIN) 1000 MCG tablet Take 1,000 mcg by mouth daily.   zinc gluconate 50 MG tablet Take 50 mg by mouth daily.   No facility-administered encounter medications on file as of 08/13/2022.    Allergies (verified) Molds & smuts and Pollen extract   History: Past Medical History:  Diagnosis Date   Allergic state 12/29/2013   Anxiety    Arthritis    Barrett's esophagus    BPH (benign prostatic hyperplasia)    Chicken pox    CVA (cerebral infarction)    Depression    GERD (gastroesophageal reflux disease)    History of kidney stones    HTN (hypertension), benign 04/01/2015   Hyperglycemia 01/06/2015   Hyperlipemia    Hypertension    IBS (irritable bowel syndrome)    Left hand pain 07/21/2017   Measles as a child   Medicare annual wellness visit, subsequent 06/26/2015   Mumps as a child   Otitis externa 12/10/2013   Parkinson disease    PFO (patent foramen ovale)    Seasonal allergies    some asthma   Skin cancer of face    Stroke [facility])    Unspecified asthma(493.90) 12/29/2013   Past Surgical History:  Procedure Laterality Date   COLONOSCOPY     EP IMPLANTABLE DEVICE N/A 10/16/2016   Procedure: Loop Recorder Insertion;  Surgeon: Thompson Grayer, MD;  Location: [place] [facility];  Service: Cardiovascular;  Laterality: N/A;   GANGLION CYST EXCISION Left 06/08/2021   Procedure: LEFT VOLAR GANGLION EXCISION;  Surgeon: Sherilyn Cooter, MD;  Location: [place];  Service: Orthopedics;  Laterality: Left;   implantable loop recorder removal  09/21/2020   Reveal LINQ removed in office   LITHOTRIPSY     multiple times    PROSTATE SURGERY  08/2017   TEE WITHOUT CARDIOVERSION N/A 09/22/2014   Procedure: TRANSESOPHAGEAL ECHOCARDIOGRAM (TEE);  Surgeon: Josue Hector, MD;  Location: [facility] ENDOSCOPY;  Service: Cardiovascular;  Laterality: N/A;   WISDOM TOOTH EXTRACTION     Family History  Problem Relation Age of Onset   Hypertension Mother    Hyperlipidemia Mother    Fibromyalgia Mother    Arthritis Mother        rheumatoid   Diabetes Sister        type 2   Hyperlipidemia Brother    Hypertension Brother    Prostate cancer Brother    Ulcers Father 6       Bleeding Ulcers   Kidney Stones Daughter    Asthma Daughter    Healthy Son    Cancer Maternal  Grandfather        skin ?   Stroke Maternal Aunt    Cancer Maternal Uncle        prostate   Social History   Socioeconomic History   Marital status: Married    Spouse name: Webb Silversmith   Number of children: 3   Years of education: college   Highest education level: Master's degree (e.g., MA, MS, MEng, MEd, MSW, MBA)  Occupational History   Occupation: retired    Comment: retired  Tobacco Use   Smoking status: Never   Smokeless tobacco: Never  Vaping Use   Vaping Use: Never used  Substance and Sexual Activity   Alcohol use: Yes    Alcohol/week: 1.0 standard drink of alcohol    Types: 1 Shots of liquor per week    Comment: social   Drug use: No   Sexual activity: Yes    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 They are living in their daughter's house to help to help take care of their granddaughter.      Patient uses a home gym 3-4 x a week.      Social Determinants of Health   Financial Resource Strain: Low Risk  (08/01/2021)   Overall Financial Resource Strain (CARDIA)    Difficulty of Paying Living Expenses: Not hard at all  Food Insecurity: No Food  Insecurity (08/13/2022)   Hunger Vital Sign    Worried About Running Out of Food in the Last Year: Never true    Ran Out of Food in the Last Year: Never true  Transportation Needs: No Transportation Needs (08/13/2022)   PRAPARE - Hydrologist (Medical): No    Lack of Transportation (Non-Medical): No  Physical Activity: Insufficiently Active (08/01/2021)   Exercise Vital Sign    Days of Exercise per Week: 1 day    Minutes of Exercise per Session: 60 min  Stress: No Stress Concern Present (08/01/2021)   [place]    Feeling of Stress : Not at all  Social Connections: Moderately Isolated (08/01/2021)   Social Connection and Isolation Panel [NHANES]    Frequency of Communication with Friends and Family: More than three times a week    Frequency of Social Gatherings with Friends and Family: More than three times a week    Attends Religious Services: Never    Marine scientist or Organizations: No    Attends Music therapist: Never    Marital Status: Married    Tobacco Counseling Counseling given: Not Answered   Clinical Intake:  Pre-visit preparation completed: Yes  Pain : No/denies pain   Diabetes: No  How often do you need to have someone help you when you read instructions, pamphlets, or other written materials from your doctor or pharmacy?: 1 - Never   Activities of Daily Living    08/13/2022    9:09 AM 07/11/2022   10:52 AM  In your present state of health, do you have any difficulty performing the following activities:  Hearing? 0 0  Vision? 1 0  Difficulty concentrating or making decisions? 0 0  Walking or climbing stairs? 0 0  Dressing or bathing? 0 0  Doing errands, shopping? 0  Preparing Food and eating ? N   Using the Toilet? N   In the past six months, have you accidently leaked urine? Y   Comment prostate issues   Do you have problems with  loss of bowel control? N   Managing your Medications? N   Managing your Finances? N   Housekeeping or managing your Housekeeping? N     Patient Care Team: Mosie Lukes, MD as PCP - General (Family Medicine) Rosalin Hawking, MD as Consulting Physician (Neurology) Ladene Artist, MD as Consulting Physician (Gastroenterology) Sheryn Bison, MD as Referring Physician (Dermatology) Robley Fries, MD as Consulting Physician (Urology) Tat, Eustace Quail, DO as Consulting Physician (Neurology)  Indicate any recent Medical Services you may have received from other than [facility] providers in the past year (date may be approximate).     Assessment:   This is a routine wellness examination for [place].  Hearing/Vision screen No results found.  Dietary issues and exercise activities discussed: Current Exercise Habits: Home exercise routine, Type of exercise: walking, Time (Minutes): 60, Frequency (Times/Week): 4, Weekly Exercise (Minutes/Week): 240, Intensity: Mild, Exercise limited by: None identified   Goals Addressed   None    Depression Screen    08/13/2022    9:08 AM 08/07/2022   10:53 AM 05/14/2022   11:39 AM 08/01/2021   10:34 AM 02/02/2021   10:47 AM 06/23/2020    9:25 AM 05/19/2020    9:38 AM  PHQ 2/9 Scores  PHQ - 2 Score 0 0 0 '1 2 4 1  '$ PHQ- 9 Score   0  3 19     Fall Risk    08/13/2022    9:08 AM 08/07/2022   10:53 AM 07/19/2022   10:32 AM 05/14/2022   11:39 AM 02/13/2022    3:03 PM  Fall Risk   Falls in the past year? 1 0 0 0 1  Number falls in past yr: 0 0  0 0  Injury with Fall? 0 0 0 0 1  Risk for fall due to : No Fall Risks      Follow up Falls evaluation completed Falls evaluation completed       [place]:  Any stairs in or around the home? Yes  If so, are there any without handrails? No  Home free of loose throw rugs in walkways, pet beds, electrical cords, etc? Yes  Adequate lighting in your home to reduce risk of falls?  Yes   ASSISTIVE DEVICES UTILIZED TO PREVENT FALLS:  Life alert? No  Use of a cane, walker or w/c? No  Grab bars in the bathroom? Yes  Shower chair or bench in shower? No  Elevated toilet seat or a handicapped toilet? No   TIMED UP AND GO:  Was the test performed?  No, audio visit .   Cognitive Function:    10/15/2016    1:37 PM  MMSE - Mini Mental State Exam  Orientation to time 5  Orientation to Place 5  Registration 3  Attention/ Calculation 5  Recall 3  Language- name 2 objects 2  Language- repeat 1  Language- follow 3 step command 3  Language- read & follow direction 1  Write a sentence 1  Copy design 1  Total score 30        08/13/2022    9:14 AM  6CIT Screen  What Year? 0 points  What month? 0 points  What time? 0 points  Count  back from 20 0 points  Months in reverse 0 points  Repeat phrase 2 points  Total Score 2 points    Immunizations Immunization History  Administered Date(s) Administered   COVID-19, mRNA, vaccine(Comirnaty)12 years and older 07/09/2022   Fluad Quad(high Dose 65+) 06/17/2019, 06/23/2020, 06/08/2022   Influenza, High Dose Seasonal PF 06/23/2017, 07/01/2018   Influenza,inj,Quad PF,6+ Mos 05/17/2014, 05/20/2015, 07/05/2016   Influenza-Unspecified 06/17/2013, 06/05/2021   PFIZER Comirnaty(Gray Top)Covid-19 Tri-Sucrose Vaccine 01/24/2021   PFIZER(Purple Top)SARS-COV-2 Vaccination 10/02/2019, 10/23/2019, 05/26/2020   Pfizer Covid-19 Vaccine Bivalent Booster 4yr & up 06/05/2021, 02/06/2022   Pneumococcal Conjugate-13 10/15/2016   Pneumococcal-Unspecified 06/17/2013   Respiratory Syncytial Virus Vaccine,Recomb Aduvanted(Arexvy) 07/09/2022   Tdap 11/11/2013, 11/26/2021   Zoster Recombinat (Shingrix) 06/19/2021, 09/13/2021    TDAP status: Up to date  Flu Vaccine status: Up to date  Pneumococcal vaccine status: Due, Education has been provided regarding the importance of this vaccine. Advised may receive this vaccine at local  pharmacy or Health Dept. Aware to provide a copy of the vaccination record if obtained from local pharmacy or Health Dept. Verbalized acceptance and understanding.  Covid-19 vaccine status: Information provided on how to obtain vaccines.   Qualifies for Shingles Vaccine? Yes   Zostavax completed No   Shingrix Completed?: Yes  Screening Tests Health Maintenance  Topic Date Due   Pneumonia Vaccine 76 Years old (2 - PPSV23 or PCV20) 10/15/2017   Medicare Annual Wellness (AWV)  08/01/2022   COVID-19 Vaccine (7 - 2023-24 season) 09/14/2022 (Originally 05/18/2022)   INFLUENZA VACCINE  Completed   Hepatitis C Screening  Completed   Zoster Vaccines- Shingrix  Completed   HPV VACCINES  Aged Out   COLONOSCOPY (Pts 45-442yrInsurance coverage will need to be confirmed)  Discontinued    Health Maintenance  Health Maintenance Due  Topic Date Due   Pneumonia Vaccine 6527Years old (2 - PPSV23 or PCV20) 10/15/2017   Medicare Annual Wellness (AWV)  08/01/2022    Colorectal cancer screening: Type of screening: Colonoscopy. Completed 09/30/13. Repeat every N/a years  Lung Cancer Screening: (Low Dose CT Chest recommended if Age 78-80ears, 30 pack-year currently smoking OR have quit w/in 15years.) does not qualify.   Additional Screening:  Hepatitis C Screening: does qualify; Completed 10/20/15  Vision Screening: Recommended annual ophthalmology exams for early detection of glaucoma and other disorders of the eye. Is the patient up to date with their annual eye exam?  Yes  Who is the provider or what is the name of the office in which the patient attends annual eye exams? Dr. PeSharen Counterf pt is not established with a provider, would they like to be referred to a provider to establish care? No .   Dental Screening: Recommended annual dental exams for proper oral hygiene  Community Resource Referral / Chronic Care Management: CRR required this visit?  No   CCM required this visit?  No       Plan:     I have personally reviewed and noted the following in the patient's chart:   Medical and social history Use of alcohol, tobacco or illicit drugs  Current medications and supplements including opioid prescriptions. Patient is not currently taking opioid prescriptions.Has is on hand for kidney stones if needed. Functional ability and status Nutritional status Physical activity Advanced directives List of other physicians Hospitalizations, surgeries, and ER visits in previous 12 months Vitals Screenings to include cognitive, depression, and falls Referrals and appointments  In addition, I have reviewed and discussed with  patient certain preventive protocols, quality metrics, and best practice recommendations. A written personalized care plan for preventive services as well as general preventive health recommendations were provided to patient.   Due to this being a telephonic visit, the after visit summary with patients personalized plan was offered to patient via mail or my-chart. Per request, patient was mailed a copy of AVS.  Beatris Ship, [place]   08/13/2022   Nurse Notes: None

## 2022-08-13 NOTE — Patient Instructions (Signed)
Paul Drake , Thank you for taking time to come for your Medicare Wellness Visit. I appreciate your ongoing commitment to your health goals. Please review the following plan we discussed and let me know if I can assist you in the future.   These are the goals we discussed:  Goals      Maintain healthy lifestyle.     Patient Stated     Increase exercise        This is a list of the screening recommended for you and due dates:  Health Maintenance  Topic Date Due   Pneumonia Vaccine (2 - PPSV23 or PCV20) 10/15/2017   COVID-19 Vaccine (7 - 2023-24 season) 09/14/2022*   Medicare Annual Wellness Visit  08/14/2023   Flu Shot  Completed   Hepatitis C Screening: USPSTF Recommendation to screen - Ages 18-79 yo.  Completed   Zoster (Shingles) Vaccine  Completed   HPV Vaccine  Aged Out   Colon Cancer Screening  Discontinued  *Topic was postponed. The date shown is not the original due date.     Next appointment: Follow up in one year for your annual wellness visit.   Preventive Care 78 Years and Older, Male Preventive care refers to lifestyle choices and visits with your health care provider that can promote health and wellness. What does preventive care include? A yearly physical exam. This is also called an annual well check. Dental exams once or twice a year. Routine eye exams. Ask your health care provider how often you should have your eyes checked. Personal lifestyle choices, including: Daily care of your teeth and gums. Regular physical activity. Eating a healthy diet. Avoiding tobacco and drug use. Limiting alcohol use. Practicing safe sex. Taking low doses of aspirin every day. Taking vitamin and mineral supplements as recommended by your health care provider. What happens during an annual well check? The services and screenings done by your health care provider during your annual well check will depend on your age, overall health, lifestyle risk factors, and family history  of disease. Counseling  Your health care provider may ask you questions about your: Alcohol use. Tobacco use. Drug use. Emotional well-being. Home and relationship well-being. Sexual activity. Eating habits. History of falls. Memory and ability to understand (cognition). Work and work Statistician. Screening  You may have the following tests or measurements: Height, weight, and BMI. Blood pressure. Lipid and cholesterol levels. These may be checked every 5 years, or more frequently if you are over 84 years old. Skin check. Lung cancer screening. You may have this screening every year starting at age 18 if you have a 30-pack-year history of smoking and currently smoke or have quit within the past 15 years. Fecal occult blood test (FOBT) of the stool. You may have this test every year starting at age 83. Flexible sigmoidoscopy or colonoscopy. You may have a sigmoidoscopy every 5 years or a colonoscopy every 10 years starting at age 41. Prostate cancer screening. Recommendations will vary depending on your family history and other risks. Hepatitis C blood test. Hepatitis B blood test. Sexually transmitted disease (STD) testing. Diabetes screening. This is done by checking your blood sugar (glucose) after you have not eaten for a while (fasting). You may have this done every 1-3 years. Abdominal aortic aneurysm (AAA) screening. You may need this if you are a current or former smoker. Osteoporosis. You may be screened starting at age 74 if you are at high risk. Talk with your health care provider about  your test results, treatment options, and if necessary, the need for more tests. Vaccines  Your health care provider may recommend certain vaccines, such as: Influenza vaccine. This is recommended every year. Tetanus, diphtheria, and acellular pertussis (Tdap, Td) vaccine. You may need a Td booster every 10 years. Zoster vaccine. You may need this after age 43. Pneumococcal 13-valent  conjugate (PCV13) vaccine. One dose is recommended after age 53. Pneumococcal polysaccharide (PPSV23) vaccine. One dose is recommended after age 31. Talk to your health care provider about which screenings and vaccines you need and how often you need them. This information is not intended to replace advice given to you by your health care provider. Make sure you discuss any questions you have with your health care provider. Document Released: 09/30/2015 Document Revised: 05/23/2016 Document Reviewed: 07/05/2015 Elsevier Interactive Patient Education  2017 [employer] Prevention in the Home Falls can cause injuries. They can happen to people of all ages. There are many things you can do to make your home safe and to help prevent falls. What can I do on the outside of my home? Regularly fix the edges of walkways and driveways and fix any cracks. Remove anything that might make you trip as you walk through a door, such as a raised step or threshold. Trim any bushes or trees on the path to your home. Use bright outdoor lighting. Clear any walking paths of anything that might make someone trip, such as rocks or tools. Regularly check to see if handrails are loose or broken. Make sure that both sides of any steps have handrails. Any raised decks and porches should have guardrails on the edges. Have any leaves, snow, or ice cleared regularly. Use sand or salt on walking paths during winter. Clean up any spills in your garage right away. This includes oil or grease spills. What can I do in the bathroom? Use night lights. Install grab bars by the toilet and in the tub and shower. Do not use towel bars as grab bars. Use non-skid mats or decals in the tub or shower. If you need to sit down in the shower, use a plastic, non-slip stool. Keep the floor dry. Clean up any water that spills on the floor as soon as it happens. Remove soap buildup in the tub or shower regularly. Attach bath mats  securely with double-sided non-slip rug tape. Do not have throw rugs and other things on the floor that can make you trip. What can I do in the bedroom? Use night lights. Make sure that you have a light by your bed that is easy to reach. Do not use any sheets or blankets that are too big for your bed. They should not hang down onto the floor. Have a firm chair that has side arms. You can use this for support while you get dressed. Do not have throw rugs and other things on the floor that can make you trip. What can I do in the kitchen? Clean up any spills right away. Avoid walking on wet floors. Keep items that you use a lot in easy-to-reach places. If you need to reach something above you, use a strong step stool that has a grab bar. Keep electrical cords out of the way. Do not use floor polish or wax that makes floors slippery. If you must use wax, use non-skid floor wax. Do not have throw rugs and other things on the floor that can make you trip. What can I do with  my stairs? Do not leave any items on the stairs. Make sure that there are handrails on both sides of the stairs and use them. Fix handrails that are broken or loose. Make sure that handrails are as long as the stairways. Check any carpeting to make sure that it is firmly attached to the stairs. Fix any carpet that is loose or worn. Avoid having throw rugs at the top or bottom of the stairs. If you do have throw rugs, attach them to the floor with carpet tape. Make sure that you have a light switch at the top of the stairs and the bottom of the stairs. If you do not have them, ask someone to add them for you. What else can I do to help prevent falls? Wear shoes that: Do not have high heels. Have rubber bottoms. Are comfortable and fit you well. Are closed at the toe. Do not wear sandals. If you use a stepladder: Make sure that it is fully opened. Do not climb a closed stepladder. Make sure that both sides of the stepladder  are locked into place. Ask someone to hold it for you, if possible. Clearly mark and make sure that you can see: Any grab bars or handrails. First and last steps. Where the edge of each step is. Use tools that help you move around (mobility aids) if they are needed. These include: Canes. Walkers. Scooters. Crutches. Turn on the lights when you go into a dark area. Replace any light bulbs as soon as they burn out. Set up your furniture so you have a clear path. Avoid moving your furniture around. If any of your floors are uneven, fix them. If there are any pets around you, be aware of where they are. Review your medicines with your doctor. Some medicines can make you feel dizzy. This can increase your chance of falling. Ask your doctor what other things that you can do to help prevent falls. This information is not intended to replace advice given to you by your health care provider. Make sure you discuss any questions you have with your health care provider. Document Released: 06/30/2009 Document Revised: 02/09/2016 Document Reviewed: 10/08/2014 Elsevier Interactive Patient Education  2017 [employer].

## 2022-08-14 DIAGNOSIS — N200 Calculus of kidney: Secondary | ICD-10-CM | POA: Diagnosis not present

## 2022-08-16 ENCOUNTER — Other Ambulatory Visit: Payer: Self-pay | Admitting: Neurology

## 2022-08-16 DIAGNOSIS — G20A1 Parkinson's disease without dyskinesia, without mention of fluctuations: Secondary | ICD-10-CM

## 2022-08-21 ENCOUNTER — Other Ambulatory Visit: Payer: Self-pay | Admitting: Family Medicine

## 2022-08-27 DIAGNOSIS — E785 Hyperlipidemia, unspecified: Secondary | ICD-10-CM | POA: Diagnosis not present

## 2022-08-27 DIAGNOSIS — N400 Enlarged prostate without lower urinary tract symptoms: Secondary | ICD-10-CM | POA: Diagnosis not present

## 2022-08-27 DIAGNOSIS — K219 Gastro-esophageal reflux disease without esophagitis: Secondary | ICD-10-CM | POA: Diagnosis not present

## 2022-08-27 DIAGNOSIS — J45909 Unspecified asthma, uncomplicated: Secondary | ICD-10-CM | POA: Diagnosis not present

## 2022-08-27 DIAGNOSIS — Z8673 Personal history of transient ischemic attack (TIA), and cerebral infarction without residual deficits: Secondary | ICD-10-CM | POA: Diagnosis not present

## 2022-08-27 DIAGNOSIS — K2270 Barrett's esophagus without dysplasia: Secondary | ICD-10-CM | POA: Diagnosis not present

## 2022-08-27 DIAGNOSIS — I10 Essential (primary) hypertension: Secondary | ICD-10-CM | POA: Diagnosis not present

## 2022-08-27 DIAGNOSIS — Z86718 Personal history of other venous thrombosis and embolism: Secondary | ICD-10-CM | POA: Diagnosis not present

## 2022-08-27 DIAGNOSIS — Z01818 Encounter for other preprocedural examination: Secondary | ICD-10-CM | POA: Diagnosis not present

## 2022-08-27 DIAGNOSIS — Z95818 Presence of other cardiac implants and grafts: Secondary | ICD-10-CM | POA: Diagnosis not present

## 2022-08-27 DIAGNOSIS — R7303 Prediabetes: Secondary | ICD-10-CM | POA: Diagnosis not present

## 2022-08-27 DIAGNOSIS — N200 Calculus of kidney: Secondary | ICD-10-CM | POA: Diagnosis not present

## 2022-08-27 DIAGNOSIS — G20A1 Parkinson's disease without dyskinesia, without mention of fluctuations: Secondary | ICD-10-CM | POA: Diagnosis not present

## 2022-09-21 DIAGNOSIS — G20A1 Parkinson's disease without dyskinesia, without mention of fluctuations: Secondary | ICD-10-CM | POA: Diagnosis not present

## 2022-09-21 DIAGNOSIS — Z7901 Long term (current) use of anticoagulants: Secondary | ICD-10-CM | POA: Diagnosis not present

## 2022-09-21 DIAGNOSIS — Z7902 Long term (current) use of antithrombotics/antiplatelets: Secondary | ICD-10-CM | POA: Diagnosis not present

## 2022-09-21 DIAGNOSIS — K2270 Barrett's esophagus without dysplasia: Secondary | ICD-10-CM | POA: Diagnosis not present

## 2022-09-21 DIAGNOSIS — Z8673 Personal history of transient ischemic attack (TIA), and cerebral infarction without residual deficits: Secondary | ICD-10-CM | POA: Diagnosis not present

## 2022-09-21 DIAGNOSIS — I10 Essential (primary) hypertension: Secondary | ICD-10-CM | POA: Diagnosis not present

## 2022-09-21 DIAGNOSIS — G8929 Other chronic pain: Secondary | ICD-10-CM | POA: Diagnosis not present

## 2022-09-21 DIAGNOSIS — E785 Hyperlipidemia, unspecified: Secondary | ICD-10-CM | POA: Diagnosis not present

## 2022-09-21 DIAGNOSIS — N2889 Other specified disorders of kidney and ureter: Secondary | ICD-10-CM | POA: Diagnosis not present

## 2022-09-21 DIAGNOSIS — R7303 Prediabetes: Secondary | ICD-10-CM | POA: Diagnosis not present

## 2022-09-21 DIAGNOSIS — K219 Gastro-esophageal reflux disease without esophagitis: Secondary | ICD-10-CM | POA: Diagnosis not present

## 2022-09-21 DIAGNOSIS — Z86718 Personal history of other venous thrombosis and embolism: Secondary | ICD-10-CM | POA: Diagnosis not present

## 2022-09-21 DIAGNOSIS — J45909 Unspecified asthma, uncomplicated: Secondary | ICD-10-CM | POA: Diagnosis not present

## 2022-09-21 DIAGNOSIS — Z8719 Personal history of other diseases of the digestive system: Secondary | ICD-10-CM | POA: Diagnosis not present

## 2022-09-21 DIAGNOSIS — N200 Calculus of kidney: Secondary | ICD-10-CM | POA: Diagnosis not present

## 2022-09-21 DIAGNOSIS — N400 Enlarged prostate without lower urinary tract symptoms: Secondary | ICD-10-CM | POA: Diagnosis not present

## 2022-09-22 DIAGNOSIS — J45909 Unspecified asthma, uncomplicated: Secondary | ICD-10-CM | POA: Diagnosis not present

## 2022-09-22 DIAGNOSIS — I10 Essential (primary) hypertension: Secondary | ICD-10-CM | POA: Diagnosis not present

## 2022-09-22 DIAGNOSIS — K2270 Barrett's esophagus without dysplasia: Secondary | ICD-10-CM | POA: Diagnosis not present

## 2022-09-22 DIAGNOSIS — G20A1 Parkinson's disease without dyskinesia, without mention of fluctuations: Secondary | ICD-10-CM | POA: Diagnosis not present

## 2022-09-22 DIAGNOSIS — N200 Calculus of kidney: Secondary | ICD-10-CM | POA: Diagnosis not present

## 2022-09-22 DIAGNOSIS — K219 Gastro-esophageal reflux disease without esophagitis: Secondary | ICD-10-CM | POA: Diagnosis not present

## 2022-09-27 DIAGNOSIS — Z4589 Encounter for adjustment and management of other implanted devices: Secondary | ICD-10-CM | POA: Diagnosis not present

## 2022-09-27 DIAGNOSIS — N200 Calculus of kidney: Secondary | ICD-10-CM | POA: Diagnosis not present

## 2022-10-09 DIAGNOSIS — L570 Actinic keratosis: Secondary | ICD-10-CM | POA: Diagnosis not present

## 2022-10-09 DIAGNOSIS — D1801 Hemangioma of skin and subcutaneous tissue: Secondary | ICD-10-CM | POA: Diagnosis not present

## 2022-10-09 DIAGNOSIS — Z85828 Personal history of other malignant neoplasm of skin: Secondary | ICD-10-CM | POA: Diagnosis not present

## 2022-10-09 DIAGNOSIS — L821 Other seborrheic keratosis: Secondary | ICD-10-CM | POA: Diagnosis not present

## 2022-10-09 DIAGNOSIS — L814 Other melanin hyperpigmentation: Secondary | ICD-10-CM | POA: Diagnosis not present

## 2022-10-11 ENCOUNTER — Other Ambulatory Visit: Payer: Self-pay | Admitting: Family Medicine

## 2022-10-16 ENCOUNTER — Ambulatory Visit (INDEPENDENT_AMBULATORY_CARE_PROVIDER_SITE_OTHER): Payer: Medicare Other | Admitting: Family

## 2022-10-16 VITALS — BP 135/79 | HR 64 | Temp 98.1°F | Resp 16 | Wt 175.0 lb

## 2022-10-16 DIAGNOSIS — H6992 Unspecified Eustachian tube disorder, left ear: Secondary | ICD-10-CM | POA: Diagnosis not present

## 2022-10-16 NOTE — Progress Notes (Unsigned)
Subjective:     Patient ID: Paul Drake, male    DOB: September 13, 1946, 77 y.o.   MRN: [id number]  Chief Complaint  Patient presents with   Otalgia    Complains of left ear pain for about 3 days    HPI Patient is in today with c/o left sided ear pain. Did drive up to the [place] and noted that elevation pressure didn't help.  Denies fever or nasal congestion. Not feeling sick otherwise.   Health Maintenance Due  Topic Date Due   Pneumonia Vaccine 15+ Years old (2 - PPSV23 or PCV20) 10/15/2017   COVID-19 Vaccine (8 - 2023-24 season) 09/03/2022    Past Medical History:  Diagnosis Date   Allergic state 12/29/2013   Anxiety    Arthritis    Barrett's esophagus    BPH (benign prostatic hyperplasia)    Chicken pox    CVA (cerebral infarction)    Depression    GERD (gastroesophageal reflux disease)    History of kidney stones    HTN (hypertension), benign 04/01/2015   Hyperglycemia 01/06/2015   Hyperlipemia    Hypertension    IBS (irritable bowel syndrome)    Left hand pain 07/21/2017   Measles as a child   Medicare annual wellness visit, subsequent 06/26/2015   Mumps as a child   Otitis externa 12/10/2013   Parkinson disease    PFO (patent foramen ovale)    Seasonal allergies    some asthma   Skin cancer of face    Stroke [facility])    Unspecified asthma(493.90) 12/29/2013    Past Surgical History:  Procedure Laterality Date   COLONOSCOPY     EP IMPLANTABLE DEVICE N/A 10/16/2016   Procedure: Loop Recorder Insertion;  Surgeon: Thompson Grayer, MD;  Location: [facility];  Service: Cardiovascular;  Laterality: N/A;   GANGLION CYST EXCISION Left 06/08/2021   Procedure: LEFT VOLAR GANGLION EXCISION;  Surgeon: Sherilyn Cooter, MD;  Location: [facility];  Service: Orthopedics;  Laterality: Left;   implantable loop recorder removal  09/21/2020   Reveal LINQ removed in office   LITHOTRIPSY     multiple times   PROSTATE SURGERY  08/2017   TEE  WITHOUT CARDIOVERSION N/A 09/22/2014   Procedure: TRANSESOPHAGEAL ECHOCARDIOGRAM (TEE);  Surgeon: Josue Hector, MD;  Location: [facility];  Service: Cardiovascular;  Laterality: N/A;   WISDOM TOOTH EXTRACTION      Family History  Problem Relation Age of Onset   Hypertension Mother    Hyperlipidemia Mother    Fibromyalgia Mother    Arthritis Mother        rheumatoid   Diabetes Sister        type 2   Hyperlipidemia Brother    Hypertension Brother    Prostate cancer Brother    Ulcers Father 19       Bleeding Ulcers   Kidney Stones Daughter    Asthma Daughter    Healthy Son    Cancer Maternal Grandfather        skin ?   Stroke Maternal Aunt    Cancer Maternal Uncle        prostate    Social History   Socioeconomic History   Marital status: Married    Spouse name: Webb Silversmith   Number of children: 3   Years of education: college   Highest education level: Master's degree (e.g., MA, MS, MEng, MEd, MSW, MBA)  Occupational History   Occupation: retired    Comment:  retired  Tobacco Use   Smoking status: Never   Smokeless tobacco: Never  Vaping Use   Vaping Use: Never used  Substance and Sexual Activity   Alcohol use: Yes    Alcohol/week: 1.0 standard drink of alcohol    Types: 1 Shots of liquor per week    Comment: social   Drug use: No   Sexual activity: Yes    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 They are living in their daughter's house to help to help take care of their granddaughter.      Patient uses a home gym 3-4 x a week.      Social Determinants of Health   Financial Resource Strain: Low Risk  (08/01/2021)   Overall Financial Resource Strain (CARDIA)    Difficulty of Paying Living Expenses: Not hard at all  Food Insecurity: No Food Insecurity (08/13/2022)   Hunger  Vital Sign    Worried About Running Out of Food in the Last Year: Never true    Ran Out of Food in the Last Year: Never true  Transportation Needs: No Transportation Needs (08/13/2022)   PRAPARE - Hydrologist (Medical): No    Lack of Transportation (Non-Medical): No  Physical Activity: Insufficiently Active (08/01/2021)   Exercise Vital Sign    Days of Exercise per Week: 1 day    Minutes of Exercise per Session: 60 min  Stress: No Stress Concern Present (08/01/2021)   [place]    Feeling of Stress : Not at all  Social Connections: Moderately Isolated (08/01/2021)   Social Connection and Isolation Panel [NHANES]    Frequency of Communication with Friends and Family: More than three times a week    Frequency of Social Gatherings with Friends and Family: More than three times a week    Attends Religious Services: Never    Marine scientist or Organizations: No    Attends Archivist Meetings: Never    Marital Status: Married  Human resources officer Violence: Not At Risk (08/13/2022)   Humiliation, Afraid, Rape, and Kick questionnaire    Fear of Current or Ex-Partner: No    Emotionally Abused: No    Physically Abused: No    Sexually Abused: No    Outpatient Medications Prior to Visit  Medication Sig Dispense Refill   acetaminophen (TYLENOL) 500 MG tablet Take 1,000 mg by mouth daily as needed for mild pain or headache.     albuterol (VENTOLIN HFA) 108 (90 Base) MCG/ACT inhaler Inhale 2 puffs into the lungs every 6 (six) hours as needed for wheezing or shortness of breath. 1 each 3   Ascorbic Acid (VITAMIN C) 500 MG CHEW Chew 500 mg by mouth daily.     carbidopa-levodopa (SINEMET IR) 25-100 MG tablet TAKE 2 TABLETS BY MOUTH AT 07:00 AM, 1 TABLET AT 10:30 AM, 1 TABLET AT 2:00 PM, AND 1 TABLET AT 5:30 PM 450 tablet 1   cetirizine (ZYRTEC) 10 MG tablet Take 10 mg by mouth daily.      clonazePAM (KLONOPIN) 0.5 MG tablet Take 1 tablet (0.5 mg total) by mouth 2 (two) times daily as needed for anxiety. 60 tablet 1   clopidogrel (PLAVIX) 75 MG tablet TAKE 1  TABLET(75 MG) BY MOUTH DAILY 90 tablet 3   COVID-19 mRNA vaccine 2023-2024 (COMIRNATY) syringe Inject into the muscle. 0.3 mL 0   finasteride (PROSCAR) 5 MG tablet Take 5 mg by mouth daily.     fluticasone (FLONASE) 50 MCG/ACT nasal spray Place 1 spray into both nostrils daily as needed for allergies.     gabapentin (NEURONTIN) 100 MG capsule TAKE 1 CAPSULE(100 MG) BY MOUTH EVERY 4 HOURS AS NEEDED 90 capsule 1   influenza vaccine adjuvanted (FLUAD QUADRIVALENT) 0.5 ML injection Inject into the muscle. 0.5 mL 0   Menthol, Topical Analgesic, (BIOFREEZE EX) Apply 1 application topically daily as needed (hand pain).     Multiple Vitamins-Minerals (MULTIVITAMINS THER. W/MINERALS) TABS tablet Take 1 tablet by mouth daily.     Naphazoline-Pheniramine (VISINE-A OP) Place 1 drop into both eyes 2 (two) times daily as needed (itchy eyes).     Omega-3 Fatty Acids (OMEGA-3 PO) Take 1 capsule by mouth daily.     oxyCODONE (ROXICODONE) 5 MG immediate release tablet Take 1 tablet (5 mg total) by mouth every 4 (four) hours as needed for severe pain. 5 tablet 0   pantoprazole (PROTONIX) 40 MG tablet Take 1 tablet (40 mg total) by mouth daily. 90 tablet 0   Polyethyl Glycol-Propyl Glycol (SYSTANE ULTRA OP) Place 1 drop into both eyes 2 (two) times daily as needed (dry eyes).     Probiotic Product (PROBIOTIC PO) Take 1 capsule by mouth daily.     RSV vaccine recomb adjuvanted (AREXVY) 120 MCG/0.5ML injection Inject into the muscle. 0.5 mL 0   senna-docusate (SENOKOT-S) 8.6-50 MG tablet Take 1 tablet by mouth 2 (two) times daily between meals as needed for moderate constipation.  0   simethicone (MYLICON) 169 MG chewable tablet Chew 125 mg by mouth as needed (Bloating).     simvastatin (ZOCOR) 20 MG tablet TAKE 1 TABLET(20 MG) BY MOUTH DAILY 90  tablet 1   venlafaxine XR (EFFEXOR-XR) 150 MG 24 hr capsule TAKE 1 CAPSULE BY MOUTH DAILY AT BEDTIME 90 capsule 1   vitamin B-12 (CYANOCOBALAMIN) 1000 MCG tablet Take 1,000 mcg by mouth daily.     zinc gluconate 50 MG tablet Take 50 mg by mouth daily.     No facility-administered medications prior to visit.    Allergies  Allergen Reactions   Molds & Smuts Other (See Comments)    Runny nose Itchy eyes    Pollen Extract Other (See Comments)    Runny nose Itchy  eyes      ROS    See HPI Objective:    Physical Exam Constitutional:      General: He is not in acute distress.    Appearance: He is well-developed.  HENT:     Head: Normocephalic and atraumatic.     Right Ear: Tympanic membrane and ear canal normal.     Left Ear: Tympanic membrane and ear canal normal.  Cardiovascular:     Rate and Rhythm: Normal rate and regular rhythm.     Heart sounds: No murmur heard. Pulmonary:     Effort: Pulmonary effort is normal. No respiratory distress.     Breath sounds: Normal breath sounds. No wheezing or rales.  Skin:    General: Skin is warm and dry.  Neurological:     Mental Status: He is alert and oriented to person, place, and time.  Psychiatric:        Behavior: Behavior normal.        Thought Content:  Thought content normal.     BP 135/79 (BP Location: Right Arm, Patient Position: Sitting, Cuff Size: Small)   Pulse 64   Temp 98.1 F (36.7 C) (Oral)   Resp 16   Wt 175 lb (79.4 kg)   SpO2 100%   BMI 27.41 kg/m  Wt Readings from Last 3 Encounters:  10/16/22 175 lb (79.4 kg)  08/07/22 177 lb 6.4 oz (80.5 kg)  08/01/22 178 lb 4 oz (80.9 kg)       Assessment & Plan:   Problem List Items Addressed This Visit       Unprioritized   Eustachian tube dysfunction - Primary    New. Trial of flonase 2 sprays each nostril once daily. Pt is advised to call if symptoms worsen or if symptoms are not improved in 1 week.       I am having [name] maintain his  multivitamins ther. w/minerals, Omega-3 Fatty Acids (OMEGA-3 PO), (Menthol, Topical Analgesic, (BIOFREEZE EX)), finasteride, albuterol, oxyCODONE, cetirizine, fluticasone, cyanocobalamin, Probiotic Product (PROBIOTIC PO), acetaminophen, Polyethyl Glycol-Propyl Glycol (SYSTANE ULTRA OP), Naphazoline-Pheniramine (VISINE-A OP), senna-docusate, clopidogrel, Vitamin C, zinc gluconate, simethicone, Comirnaty, Fluad Quadrivalent, Arexvy, pantoprazole, venlafaxine XR, clonazePAM, simvastatin, carbidopa-levodopa, and gabapentin.  No orders of the defined types were placed in this encounter.

## 2022-10-17 NOTE — Assessment & Plan Note (Signed)
New. Trial of flonase 2 sprays each nostril once daily. Pt is advised to call if symptoms worsen or if symptoms are not improved in 1 week.

## 2022-10-21 DIAGNOSIS — K219 Gastro-esophageal reflux disease without esophagitis: Secondary | ICD-10-CM | POA: Diagnosis not present

## 2022-10-21 DIAGNOSIS — R0789 Other chest pain: Secondary | ICD-10-CM | POA: Diagnosis not present

## 2022-10-21 DIAGNOSIS — Z20822 Contact with and (suspected) exposure to covid-19: Secondary | ICD-10-CM | POA: Diagnosis not present

## 2022-10-21 DIAGNOSIS — N400 Enlarged prostate without lower urinary tract symptoms: Secondary | ICD-10-CM | POA: Diagnosis not present

## 2022-10-21 DIAGNOSIS — Z8673 Personal history of transient ischemic attack (TIA), and cerebral infarction without residual deficits: Secondary | ICD-10-CM | POA: Diagnosis not present

## 2022-10-21 DIAGNOSIS — R079 Chest pain, unspecified: Secondary | ICD-10-CM | POA: Diagnosis not present

## 2022-10-21 DIAGNOSIS — R748 Abnormal levels of other serum enzymes: Secondary | ICD-10-CM | POA: Diagnosis not present

## 2022-10-21 DIAGNOSIS — I480 Paroxysmal atrial fibrillation: Secondary | ICD-10-CM | POA: Diagnosis not present

## 2022-10-21 DIAGNOSIS — K2270 Barrett's esophagus without dysplasia: Secondary | ICD-10-CM | POA: Diagnosis not present

## 2022-10-21 DIAGNOSIS — M542 Cervicalgia: Secondary | ICD-10-CM | POA: Diagnosis not present

## 2022-10-21 DIAGNOSIS — R9431 Abnormal electrocardiogram [ECG] [EKG]: Secondary | ICD-10-CM | POA: Diagnosis not present

## 2022-10-21 DIAGNOSIS — H9203 Otalgia, bilateral: Secondary | ICD-10-CM | POA: Diagnosis not present

## 2022-10-21 DIAGNOSIS — G20A1 Parkinson's disease without dyskinesia, without mention of fluctuations: Secondary | ICD-10-CM | POA: Diagnosis not present

## 2022-10-21 DIAGNOSIS — E785 Hyperlipidemia, unspecified: Secondary | ICD-10-CM | POA: Diagnosis not present

## 2022-10-21 DIAGNOSIS — Z79899 Other long term (current) drug therapy: Secondary | ICD-10-CM | POA: Diagnosis not present

## 2022-10-21 DIAGNOSIS — R42 Dizziness and giddiness: Secondary | ICD-10-CM | POA: Diagnosis not present

## 2022-10-22 DIAGNOSIS — R079 Chest pain, unspecified: Secondary | ICD-10-CM | POA: Diagnosis not present

## 2022-10-22 DIAGNOSIS — I517 Cardiomegaly: Secondary | ICD-10-CM | POA: Diagnosis not present

## 2022-10-22 DIAGNOSIS — I4891 Unspecified atrial fibrillation: Secondary | ICD-10-CM | POA: Diagnosis not present

## 2022-10-22 DIAGNOSIS — I351 Nonrheumatic aortic (valve) insufficiency: Secondary | ICD-10-CM | POA: Diagnosis not present

## 2022-10-22 DIAGNOSIS — I361 Nonrheumatic tricuspid (valve) insufficiency: Secondary | ICD-10-CM | POA: Diagnosis not present

## 2022-10-22 DIAGNOSIS — I340 Nonrheumatic mitral (valve) insufficiency: Secondary | ICD-10-CM | POA: Diagnosis not present

## 2022-10-30 ENCOUNTER — Other Ambulatory Visit: Payer: Self-pay | Admitting: Neurology

## 2022-10-30 ENCOUNTER — Telehealth: Payer: Self-pay

## 2022-10-30 ENCOUNTER — Other Ambulatory Visit: Payer: Self-pay

## 2022-10-30 DIAGNOSIS — G20A1 Parkinson's disease without dyskinesia, without mention of fluctuations: Secondary | ICD-10-CM

## 2022-10-30 MED ORDER — CLONAZEPAM 0.5 MG PO TABS: 0.5000 mg | ORAL_TABLET | Freq: Every day | ORAL | 4 refills | Status: DC

## 2022-10-30 NOTE — Telephone Encounter (Signed)
Pt called in and left a message. He stated the pharmacy told him the klonopin had been rescinded. He wanted to find out why.

## 2022-10-30 NOTE — Telephone Encounter (Signed)
MEDICATION: clonazePAM (KLONOPIN) 0.5 MG tablet   PHARMACY: [facility]:  [address], [place], [place] [address]  Comments: Patient is completely out.   **Let patient know to contact pharmacy at the end of the day to make sure medication is ready. **  ** Please notify patient to allow 48-72 hours to process**  **Encourage patient to contact the pharmacy for refills or they can request refills through [facility]**

## 2022-10-30 NOTE — Telephone Encounter (Signed)
Medication has been sent to Dr. Carles Collet for approval

## 2022-10-30 NOTE — Telephone Encounter (Signed)
Called patient and he said that he knew the pharmacy was incorrect he just wanted to check with our office

## 2022-11-01 ENCOUNTER — Other Ambulatory Visit: Payer: Self-pay | Admitting: Family Medicine

## 2022-11-11 NOTE — Assessment & Plan Note (Signed)
Following with neurology and tolerating meds

## 2022-11-11 NOTE — Assessment & Plan Note (Signed)
hgba1c acceptable, minimize simple carbs. Increase exercise as tolerated.  

## 2022-11-11 NOTE — Assessment & Plan Note (Signed)
No recent exacerbation 

## 2022-11-11 NOTE — Assessment & Plan Note (Signed)
Encourage heart healthy diet such as MIND or DASH diet, increase exercise, avoid trans fats, simple carbohydrates and processed foods, consider a krill or fish or flaxseed oil cap daily.  °

## 2022-11-11 NOTE — Assessment & Plan Note (Signed)
Well controlled, no changes to meds. Encouraged heart healthy diet such as the DASH diet and exercise as tolerated.  °

## 2022-11-12 ENCOUNTER — Ambulatory Visit (INDEPENDENT_AMBULATORY_CARE_PROVIDER_SITE_OTHER): Payer: Medicare Other | Admitting: Family Medicine

## 2022-11-12 ENCOUNTER — Encounter: Payer: Self-pay | Admitting: Family Medicine

## 2022-11-12 VITALS — BP 130/74 | HR 74 | Temp 98.0°F | Resp 16 | Ht 67.0 in | Wt 177.2 lb

## 2022-11-12 DIAGNOSIS — I10 Essential (primary) hypertension: Secondary | ICD-10-CM

## 2022-11-12 DIAGNOSIS — G20B1 Parkinson's disease with dyskinesia, without mention of fluctuations: Secondary | ICD-10-CM | POA: Diagnosis not present

## 2022-11-12 DIAGNOSIS — Z0000 Encounter for general adult medical examination without abnormal findings: Secondary | ICD-10-CM

## 2022-11-12 DIAGNOSIS — Z0001 Encounter for general adult medical examination with abnormal findings: Secondary | ICD-10-CM

## 2022-11-12 DIAGNOSIS — N200 Calculus of kidney: Secondary | ICD-10-CM

## 2022-11-12 DIAGNOSIS — I4891 Unspecified atrial fibrillation: Secondary | ICD-10-CM | POA: Insufficient documentation

## 2022-11-12 DIAGNOSIS — E785 Hyperlipidemia, unspecified: Secondary | ICD-10-CM

## 2022-11-12 DIAGNOSIS — R739 Hyperglycemia, unspecified: Secondary | ICD-10-CM | POA: Diagnosis not present

## 2022-11-12 DIAGNOSIS — J45909 Unspecified asthma, uncomplicated: Secondary | ICD-10-CM

## 2022-11-12 DIAGNOSIS — K5900 Constipation, unspecified: Secondary | ICD-10-CM

## 2022-11-12 NOTE — Assessment & Plan Note (Signed)
He follows with [facility] and they have cleared some stones but he still has one on the right but no symptoms such as hematuria or dysuria. He has a f/u appt on April 11

## 2022-11-12 NOTE — Assessment & Plan Note (Signed)
Had a bad episode while at the [facility] and [employer], he went to [facility] and they were able to show he was in afib. He is now on Plavix and Xarelto and has a pacer he has a follow up appt with cards at [facility] on April 8

## 2022-11-12 NOTE — Patient Instructions (Addendum)
Miralax plus Benefiber daily   Preventive Care 77 Years and Older, Male Preventive care refers to lifestyle choices and visits with your health care provider that can promote health and wellness. Preventive care visits are also called wellness exams. What can I expect for my preventive care visit? Counseling During your preventive care visit, your health care provider may ask about your: Medical history, including: Past medical problems. Family medical history. History of falls. Current health, including: Emotional well-being. Home life and relationship well-being. Sexual activity. Memory and ability to understand (cognition). Lifestyle, including: Alcohol, nicotine or tobacco, and drug use. Access to firearms. Diet, exercise, and sleep habits. Work and work Statistician. Sunscreen use. Safety issues such as seatbelt and bike helmet use. Physical exam Your health care provider will check your: Height and weight. These may be used to calculate your BMI (body mass index). BMI is a measurement that tells if you are at a healthy weight. Waist circumference. This measures the distance around your waistline. This measurement also tells if you are at a healthy weight and may help predict your risk of certain diseases, such as type 2 diabetes and high blood pressure. Heart rate and blood pressure. Body temperature. Skin for abnormal spots. What immunizations do I need?  Vaccines are usually given at various ages, according to a schedule. Your health care provider will recommend vaccines for you based on your age, medical history, and lifestyle or other factors, such as travel or where you work. What tests do I need? Screening Your health care provider may recommend screening tests for certain conditions. This may include: Lipid and cholesterol levels. Diabetes screening. This is done by checking your blood sugar (glucose) after you have not eaten for a while (fasting). Hepatitis C  test. Hepatitis B test. HIV (human immunodeficiency virus) test. STI (sexually transmitted infection) testing, if you are at risk. Lung cancer screening. Colorectal cancer screening. Prostate cancer screening. Abdominal aortic aneurysm (AAA) screening. You may need this if you are a current or former smoker. Talk with your health care provider about your test results, treatment options, and if necessary, the need for more tests. Follow these instructions at home: Eating and drinking  Eat a diet that includes fresh fruits and vegetables, whole grains, lean protein, and low-fat dairy products. Limit your intake of foods with high amounts of sugar, saturated fats, and salt. Take vitamin and mineral supplements as recommended by your health care provider. Do not drink alcohol if your health care provider tells you not to drink. If you drink alcohol: Limit how much you have to 0-2 drinks a day. Know how much alcohol is in your drink. In the [place] one drink equals one 12 oz bottle of beer (355 mL), one 5 oz glass of wine (148 mL), or one 1 oz glass of hard liquor (44 mL). Lifestyle Brush your teeth every morning and night with fluoride toothpaste. Floss one time each day. Exercise for at least 30 minutes 5 or more days each week. Do not use any products that contain nicotine or tobacco. These products include cigarettes, chewing tobacco, and vaping devices, such as e-cigarettes. If you need help quitting, ask your health care provider. Do not use drugs. If you are sexually active, practice safe sex. Use a condom or other form of protection to prevent STIs. Take aspirin only as told by your health care provider. Make sure that you understand how much to take and what form to take. Work with your health care provider to  find out whether it is safe and beneficial for you to take aspirin daily. Ask your health care provider if you need to take a cholesterol-lowering medicine (statin). Find healthy  ways to manage stress, such as: Meditation, yoga, or listening to music. Journaling. Talking to a trusted person. Spending time with friends and family. Safety Always wear your seat belt while driving or riding in a vehicle. Do not drive: If you have been drinking alcohol. Do not ride with someone who has been drinking. When you are tired or distracted. While texting. If you have been using any mind-altering substances or drugs. Wear a helmet and other protective equipment during sports activities. If you have firearms in your house, make sure you follow all gun safety procedures. Minimize exposure to UV radiation to reduce your risk of skin cancer. What's next? Visit your health care provider once a year for an annual wellness visit. Ask your health care provider how often you should have your eyes and teeth checked. Stay up to date on all vaccines. This information is not intended to replace advice given to you by your health care provider. Make sure you discuss any questions you have with your health care provider. Document Revised: 03/01/2021 Document Reviewed: 03/01/2021 Elsevier Patient Education  [place].

## 2022-11-12 NOTE — Assessment & Plan Note (Signed)
Encouraged increased hydration and fiber in diet. Daily probiotics. If bowels not moving can use MOM 2 tbls po in 4 oz of warm prune juice by mouth every 2-3 days. If no results then repeat in 4 hours with  Dulcolax suppository pr, may repeat again in 4 more hours as needed. Seek care if symptoms worsen. Consider daily Miralax and/or Dulcolax if symptoms persist.  Continue Senokot 2 caps qhs, and prn, continue Miralax and add Benefiber daily, probiotics

## 2022-11-14 NOTE — Assessment & Plan Note (Signed)
Patient denies any difficulties at home. No trouble with ADLs, depression or falls. No recent changes to vision or hearing. Is UTD with immunizations. Is UTD with screening. Discussed Advanced Directives, patient agrees to bring [place] copies of documents if can. Encouraged heart healthy diet, exercise as tolerated and adequate sleep. Labs reviewed. Urology, Dr [facility] dermatology [facility], Dr Fuller Plan See problem list for risk factors See AVS for health maintenance Given and reviewed copy of ACP documents from [facility] Secretary of State and encouraged to complete and return

## 2022-11-14 NOTE — Progress Notes (Signed)
Subjective:    Patient ID: Paul Drake, male    DOB: 03-17-1946, 77 y.o.   MRN: [id number]  Chief Complaint  Patient presents with   Annual Exam    Annual Exam    HPI Patient is in today for follow up on chronic medical concerns and for an Annual Preventative Exam. No recent febrile illness. Denies CP/palp/SOB/HA/congestion/fevers/GI or GU c/o. Taking meds as prescribed. He had a sudden on set of symptoms that took him to the hospital while he was at the [place] game. He was sfound to be in afib and is now following with cardiology at [facility]. He feels well today. He is trying to maintain  a heart healthy diet and to stay active.   Past Medical History:  Diagnosis Date   Allergic state 12/29/2013   Anxiety    Arthritis    Barrett's esophagus    BPH (benign prostatic hyperplasia)    Chicken pox    CVA (cerebral infarction)    Depression    GERD (gastroesophageal reflux disease)    History of kidney stones    HTN (hypertension), benign 04/01/2015   Hyperglycemia 01/06/2015   Hyperlipemia    Hypertension    IBS (irritable bowel syndrome)    Left hand pain 07/21/2017   Measles as a child   Medicare annual wellness visit, subsequent 06/26/2015   Mumps as a child   Otitis externa 12/10/2013   Parkinson disease    PFO (patent foramen ovale)    Seasonal allergies    some asthma   Skin cancer of face    Stroke [facility])    Unspecified asthma(493.90) 12/29/2013    Past Surgical History:  Procedure Laterality Date   COLONOSCOPY     EP IMPLANTABLE DEVICE N/A 10/16/2016   Procedure: Loop Recorder Insertion;  Surgeon: Thompson Grayer, MD;  Location: [place] [facility];  Service: Cardiovascular;  Laterality: N/A;   GANGLION CYST EXCISION Left 06/08/2021   Procedure: LEFT VOLAR GANGLION EXCISION;  Surgeon: Sherilyn Cooter, MD;  Location: [place];  Service: Orthopedics;  Laterality: Left;   implantable loop recorder removal  09/21/2020   Reveal LINQ removed  in office   LITHOTRIPSY     multiple times   PROSTATE SURGERY  08/2017   TEE WITHOUT CARDIOVERSION N/A 09/22/2014   Procedure: TRANSESOPHAGEAL ECHOCARDIOGRAM (TEE);  Surgeon: Josue Hector, MD;  Location: [facility];  Service: Cardiovascular;  Laterality: N/A;   WISDOM TOOTH EXTRACTION      Family History  Problem Relation Age of Onset   Hypertension Mother    Hyperlipidemia Mother    Fibromyalgia Mother    Arthritis Mother        rheumatoid   Ulcers Father 31       Bleeding Ulcers   Heart disease Sister        heart failure   Diabetes Sister        type 2   Hyperlipidemia Brother    Hypertension Brother    Prostate cancer Brother    Kidney Stones Daughter    Anorexia nervosa Daughter    Asthma Daughter    Bulemia Daughter    Healthy Son    Stroke Maternal Aunt    Cancer Maternal Uncle        prostate   Cancer Maternal Grandfather        skin ?    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 They are living in their daughter's house to help to help take care of their granddaughter.      Patient uses a home gym 3-4 x a week.      Social Determinants of Health   Financial Resource Strain: Low Risk   (08/01/2021)   Overall Financial Resource Strain (CARDIA)    Difficulty of Paying Living Expenses: Not hard at all  Food Insecurity: No Food Insecurity (08/13/2022)   Hunger Vital Sign    Worried About Running Out of Food in the Last Year: Never true    Ran Out of Food in the Last Year: Never true  Transportation Needs: No Transportation Needs (08/13/2022)   PRAPARE - Hydrologist (Medical): No    Lack of Transportation (Non-Medical): No  Physical Activity: Insufficiently Active (08/01/2021)   Exercise Vital Sign    Days of Exercise per Week: 1 day    Minutes of Exercise per Session: 60 min  Stress: No Stress Concern Present (08/01/2021)   [place]    Feeling of Stress : Not at all  Social Connections: Moderately Isolated (08/01/2021)   Social Connection and Isolation Panel [NHANES]    Frequency of Communication with Friends and Family: More than three times a week    Frequency of Social Gatherings with Friends and Family: More than three times a week    Attends Religious Services: Never    Marine scientist or Organizations: No    Attends Archivist Meetings: Never    Marital Status: Married  Human resources officer Violence: Not At Risk (08/13/2022)   Humiliation, Afraid, Rape, and Kick questionnaire    Fear of Current or Ex-Partner: No    Emotionally Abused: No    Physically Abused: No    Sexually Abused: No    Outpatient Medications Prior to Visit  Medication Sig Dispense Refill   acetaminophen (TYLENOL) 500 MG tablet Take 1,000 mg by mouth daily as needed for mild pain or headache.     albuterol (VENTOLIN HFA) 108 (90 Base) MCG/ACT inhaler Inhale 2 puffs into the lungs every 6 (six) hours as needed for wheezing or shortness of breath. 1 each 3   Ascorbic Acid (VITAMIN C) 500 MG CHEW Chew 500 mg by mouth daily.     carbidopa-levodopa (SINEMET IR) 25-100 MG tablet TAKE 2  TABLETS BY MOUTH AT 07:00 AM, 1 TABLET AT 10:30 AM, 1 TABLET AT 2:00 PM, AND 1 TABLET AT 5:30 PM 450 tablet 1   cetirizine (ZYRTEC) 10 MG tablet Take 10 mg by mouth daily.  clonazePAM (KLONOPIN) 0.5 MG tablet Take 1 tablet (0.5 mg total) by mouth at bedtime. 30 tablet 4   clopidogrel (PLAVIX) 75 MG tablet TAKE 1 TABLET(75 MG) BY MOUTH DAILY 90 tablet 3   COVID-19 mRNA vaccine 2023-2024 (COMIRNATY) syringe Inject into the muscle. 0.3 mL 0   finasteride (PROSCAR) 5 MG tablet Take 5 mg by mouth daily.     fluticasone (FLONASE) 50 MCG/ACT nasal spray Place 1 spray into both nostrils daily as needed for allergies.     gabapentin (NEURONTIN) 100 MG capsule TAKE 1 CAPSULE(100 MG) BY MOUTH EVERY 4 HOURS AS NEEDED 90 capsule 1   influenza vaccine adjuvanted (FLUAD QUADRIVALENT) 0.5 ML injection Inject into the muscle. 0.5 mL 0   Menthol, Topical Analgesic, (BIOFREEZE EX) Apply 1 application topically daily as needed (hand pain).     Multiple Vitamins-Minerals (MULTIVITAMINS THER. W/MINERALS) TABS tablet Take 1 tablet by mouth daily.     Naphazoline-Pheniramine (VISINE-A OP) Place 1 drop into both eyes 2 (two) times daily as needed (itchy eyes).     Omega-3 Fatty Acids (OMEGA-3 PO) Take 1 capsule by mouth daily.     oxyCODONE (ROXICODONE) 5 MG immediate release tablet Take 1 tablet (5 mg total) by mouth every 4 (four) hours as needed for severe pain. 5 tablet 0   pantoprazole (PROTONIX) 40 MG tablet TAKE 1 TABLET(40 MG) BY MOUTH DAILY 90 tablet 0   Polyethyl Glycol-Propyl Glycol (SYSTANE ULTRA OP) Place 1 drop into both eyes 2 (two) times daily as needed (dry eyes).     Probiotic Product (PROBIOTIC PO) Take 1 capsule by mouth daily.     RSV vaccine recomb adjuvanted (AREXVY) 120 MCG/0.5ML injection Inject into the muscle. 0.5 mL 0   senna-docusate (SENOKOT-S) 8.6-50 MG tablet Take 1 tablet by mouth 2 (two) times daily between meals as needed for moderate constipation.  0   simethicone (MYLICON) 0000000 MG  chewable tablet Chew 125 mg by mouth as needed (Bloating).     simvastatin (ZOCOR) 20 MG tablet TAKE 1 TABLET(20 MG) BY MOUTH DAILY 90 tablet 1   venlafaxine XR (EFFEXOR-XR) 150 MG 24 hr capsule TAKE 1 CAPSULE BY MOUTH DAILY AT BEDTIME 90 capsule 1   vitamin B-12 (CYANOCOBALAMIN) 1000 MCG tablet Take 1,000 mcg by mouth daily.     zinc gluconate 50 MG tablet Take 50 mg by mouth daily.     No facility-administered medications prior to visit.    Allergies  Allergen Reactions   Molds & Smuts Other (See Comments)    Runny nose Itchy eyes    Pollen Extract Other (See Comments)    Runny nose Itchy  eyes      Review of Systems  Constitutional:  Negative for fever and malaise/fatigue.  HENT:  Negative for congestion.   Eyes:  Negative for blurred vision.  Respiratory:  Negative for shortness of breath.   Cardiovascular:  Negative for chest pain, palpitations and leg swelling.  Gastrointestinal:  Positive for constipation. Negative for abdominal pain, blood in stool, diarrhea and nausea.  Genitourinary:  Negative for dysuria and frequency.  Musculoskeletal:  Negative for falls.  Skin:  Negative for rash.  Neurological:  Positive for tremors. Negative for dizziness, loss of consciousness and headaches.  Endo/Heme/Allergies:  Negative for environmental allergies.  Psychiatric/Behavioral:  Negative for depression. The patient is not nervous/anxious.        Objective:    Physical Exam Constitutional:      General: He is not in acute distress.  Appearance: Normal appearance. He is not ill-appearing or toxic-appearing.  HENT:     Head: Normocephalic and atraumatic.     Right Ear: Tympanic membrane and external ear normal.     Left Ear: Tympanic membrane and external ear normal.     Nose: Nose normal. No congestion.  Eyes:     General:        Right eye: No discharge.        Left eye: No discharge.     Pupils: Pupils are equal, round, and reactive to light.  Cardiovascular:      Rate and Rhythm: Normal rate.     Heart sounds: No murmur heard. Pulmonary:     Effort: Pulmonary effort is normal.  Abdominal:     General: Abdomen is flat. Bowel sounds are normal. There is no distension.     Tenderness: There is no guarding.  Musculoskeletal:        General: No tenderness.  Lymphadenopathy:     Cervical: No cervical adenopathy.  Skin:    Findings: No rash.  Neurological:     General: No focal deficit present.     Mental Status: He is alert and oriented to person, place, and time.  Psychiatric:        Mood and Affect: Mood normal.        Behavior: Behavior normal.     BP 130/74 (BP Location: Right Arm, Patient Position: Sitting, Cuff Size: Normal)   Pulse 74   Temp 98 F (36.7 C) (Oral)   Resp 16   Ht '5\' 7"'$  (1.702 m)   Wt 177 lb 3.2 oz (80.4 kg)   SpO2 99%   BMI 27.75 kg/m  Wt Readings from Last 3 Encounters:  11/12/22 177 lb 3.2 oz (80.4 kg)  10/16/22 175 lb (79.4 kg)  08/07/22 177 lb 6.4 oz (80.5 kg)    Diabetic Foot Exam - Simple   No data filed    Lab Results  Component Value Date   WBC 6.3 07/11/2022   HGB 13.0 07/11/2022   HCT 40.4 07/11/2022   PLT 173 07/11/2022   GLUCOSE 108 (H) 07/11/2022   CHOL 151 05/14/2022   TRIG 97.0 05/14/2022   HDL 64.20 05/14/2022   LDLDIRECT 76.0 12/31/2014   LDLCALC 67 05/14/2022   ALT <5 07/11/2022   AST 18 07/11/2022   NA 138 07/11/2022   K 4.5 07/11/2022   CL 104 07/11/2022   CREATININE 1.24 07/11/2022   BUN 20 07/11/2022   CO2 27 07/11/2022   TSH 1.66 05/14/2022   PSA 1.77 08/07/2022   INR 0.98 09/20/2014   HGBA1C 5.8 05/14/2022    Lab Results  Component Value Date   TSH 1.66 05/14/2022   Lab Results  Component Value Date   WBC 6.3 07/11/2022   HGB 13.0 07/11/2022   HCT 40.4 07/11/2022   MCV 99.8 07/11/2022   PLT 173 07/11/2022   Lab Results  Component Value Date   NA 138 07/11/2022   K 4.5 07/11/2022   CO2 27 07/11/2022   GLUCOSE 108 (H) 07/11/2022   BUN 20 07/11/2022    CREATININE 1.24 07/11/2022   BILITOT 0.8 07/11/2022   ALKPHOS 71 07/11/2022   AST 18 07/11/2022   ALT <5 07/11/2022   PROT 7.2 07/11/2022   ALBUMIN 4.1 07/11/2022   CALCIUM 9.0 07/11/2022   ANIONGAP 7 07/11/2022   GFR 55.91 (L) 05/14/2022   Lab Results  Component Value Date   CHOL 151 05/14/2022  Lab Results  Component Value Date   HDL 64.20 05/14/2022   Lab Results  Component Value Date   LDLCALC 67 05/14/2022   Lab Results  Component Value Date   TRIG 97.0 05/14/2022   Lab Results  Component Value Date   CHOLHDL 2 05/14/2022   Lab Results  Component Value Date   HGBA1C 5.8 05/14/2022       Assessment & Plan:  Essential hypertension Assessment & Plan: Well controlled, no changes to meds. Encouraged heart healthy diet such as the DASH diet and exercise as tolerated.     Hyperlipidemia, unspecified hyperlipidemia type Assessment & Plan: Encourage heart healthy diet such as MIND or DASH diet, increase exercise, avoid trans fats, simple carbohydrates and processed foods, consider a krill or fish or flaxseed oil cap daily.     Hyperglycemia Assessment & Plan: hgba1c acceptable, minimize simple carbs. Increase exercise as tolerated.    Parkinson's disease with dyskinesia, unspecified whether manifestations fluctuate Assessment & Plan: Following with neurology and tolerating meds   Uncomplicated asthma, unspecified asthma severity, unspecified whether persistent Assessment & Plan: No recent exacerbation   Constipation, unspecified constipation type Assessment & Plan: Encouraged increased hydration and fiber in diet. Daily probiotics. If bowels not moving can use MOM 2 tbls po in 4 oz of warm prune juice by mouth every 2-3 days. If no results then repeat in 4 hours with  Dulcolax suppository pr, may repeat again in 4 more hours as needed. Seek care if symptoms worsen. Consider daily Miralax and/or Dulcolax if symptoms persist.  Continue Senokot 2 caps qhs,  and prn, continue Miralax and add Benefiber daily, probiotics   Kidney stone on right side Assessment & Plan: He follows with [place] u[facility] and they have cleared some stones but he still has one on the right but no symptoms such as hematuria or dysuria. He has a f/u appt on April 11   Atrial fibrillation, unspecified type [facility]) Assessment & Plan: Had a bad episode while at the [facility] and [employer], he went to [facility] and they were able to show he was in afib. He is now on Plavix and Xarelto and has a pacer he has a follow up appt with cards at [facility] on April 8   Preventative health care Assessment & Plan: Patient denies any difficulties at home. No trouble with ADLs, depression or falls. No recent changes to vision or hearing. Is UTD with immunizations. Is UTD with screening. Discussed Advanced Directives, patient agrees to bring [place] copies of documents if can. Encouraged heart healthy diet, exercise as tolerated and adequate sleep. Labs reviewed. Urology, Dr [facility] dermatology [facility]ology, Dr Fuller Plan See problem list for risk factors See AVS for health maintenance Given and reviewed copy of ACP documents from [facility] Secretary of State and encouraged to complete and return      Penni Homans, MD

## 2022-11-20 ENCOUNTER — Telehealth: Payer: Self-pay | Admitting: Family Medicine

## 2022-11-20 ENCOUNTER — Other Ambulatory Visit: Payer: Self-pay | Admitting: Family Medicine

## 2022-11-20 NOTE — Telephone Encounter (Signed)
Pt dropped off copy of Power of Attorney for provider to see and have on pts chart. Document put at front office tray under providers name.

## 2022-11-20 NOTE — Telephone Encounter (Signed)
We have Documents in Dr.Blyth bin  And to go to chart

## 2022-11-23 DIAGNOSIS — I4891 Unspecified atrial fibrillation: Secondary | ICD-10-CM | POA: Diagnosis not present

## 2022-11-25 DIAGNOSIS — I4891 Unspecified atrial fibrillation: Secondary | ICD-10-CM | POA: Diagnosis not present

## 2022-11-29 ENCOUNTER — Encounter: Payer: Self-pay | Admitting: Gastroenterology

## 2022-12-06 ENCOUNTER — Ambulatory Visit: Payer: Medicare Other | Admitting: Family Medicine

## 2022-12-27 DIAGNOSIS — N200 Calculus of kidney: Secondary | ICD-10-CM | POA: Diagnosis not present

## 2023-01-07 ENCOUNTER — Telehealth: Payer: Self-pay | Admitting: Family Medicine

## 2023-01-07 NOTE — Telephone Encounter (Signed)
Pt called to advise that he received a bill for $325 for his visit on 11/12/22 but that was his annual medicare visit and if we code it that way, the insurance should pay for it. Please review for coding and resubmit.

## 2023-01-08 DIAGNOSIS — N200 Calculus of kidney: Secondary | ICD-10-CM | POA: Diagnosis not present

## 2023-01-15 NOTE — Progress Notes (Deleted)
Assessment/Plan:   1.  Parkinsons Disease  -Continue carbidopa/levodopa 25/100, 2 at 7am, 1 at 10:30, 1 at 2 pm, 1 at 5:30 pm  -exercise  -He follows regularly with dermatology.  -he wants to donate brain/body to Parkinsons Disease/science.  Information given on this.   2.  RBD  -Continue clonazepam 0.5 mg at bedtime   3.  New onset paroxysmal A-fib, February 2024  -Following with cardiology at [facility] and now on Xarelto  Subjective:   Paul Drake was seen today in follow up for Parkinsons disease.  My previous records were reviewed prior to todays visit as well as outside records available to me.  Patient doing fairly well from a Parkinson's standpoint.  He was admitted to the hospital at [facility] in February for chest pain and lightheadedness.  Patient was at a women's basketball game earlier in the day and stood up at the end of the game and felt weak and lightheaded.  They left the game and went to lunch and he had chest pain while at lunch and went to the emergency room to be evaluated.  Initial EKG with A-fib with heart rate in the 110s.  Patient had conversion to normal sinus rhythm.  He was discharged on Xarelto.  He followed back up with cardiology March 8.  Notes are reviewed.  Separately, patient has also been following with [facility] for kidney stones.  Current prescribed movement disorder medications: Carbidopa/levodopa 25/100, 2 at 7am, 1 at 10:30, 1 at 2 pm, 1 at 5:30 pm (increased last visit) Clonazepam 0.5 mg at bedtime (PDMP reviewed.  Last filled December 31, 2022)   ALLERGIES:   Allergies  Allergen Reactions   Molds & Smuts Other (See Comments)    Runny nose Itchy eyes    Pollen Extract Other (See Comments)    Runny nose Itchy  eyes      CURRENT MEDICATIONS:  Outpatient Encounter Medications as of 01/22/2023  Medication Sig   acetaminophen (TYLENOL) 500 MG tablet Take 1,000 mg by mouth daily as needed for mild pain or headache.   albuterol (VENTOLIN HFA) 108  (90 Base) MCG/ACT inhaler Inhale 2 puffs into the lungs every 6 (six) hours as needed for wheezing or shortness of breath.   Ascorbic Acid (VITAMIN C) 500 MG CHEW Chew 500 mg by mouth daily.   carbidopa-levodopa (SINEMET IR) 25-100 MG tablet TAKE 2 TABLETS BY MOUTH AT 07:00 AM, 1 TABLET AT 10:30 AM, 1 TABLET AT 2:00 PM, AND 1 TABLET AT 5:30 PM   cetirizine (ZYRTEC) 10 MG tablet Take 10 mg by mouth daily.   clonazePAM (KLONOPIN) 0.5 MG tablet Take 1 tablet (0.5 mg total) by mouth at bedtime.   clopidogrel (PLAVIX) 75 MG tablet TAKE 1 TABLET(75 MG) BY MOUTH DAILY   COVID-19 mRNA vaccine 2023-2024 (COMIRNATY) syringe Inject into the muscle.   finasteride (PROSCAR) 5 MG tablet Take 5 mg by mouth daily.   fluticasone (FLONASE) 50 MCG/ACT nasal spray Place 1 spray into both nostrils daily as needed for allergies.   gabapentin (NEURONTIN) 100 MG capsule TAKE 1 CAPSULE BY MOUTH EVERY 4 HOURS   influenza vaccine adjuvanted (FLUAD QUADRIVALENT) 0.5 ML injection Inject into the muscle.   Menthol, Topical Analgesic, (BIOFREEZE EX) Apply 1 application topically daily as needed (hand pain).   Multiple Vitamins-Minerals (MULTIVITAMINS THER. W/MINERALS) TABS tablet Take 1 tablet by mouth daily.   Naphazoline-Pheniramine (VISINE-A OP) Place 1 drop into both eyes 2 (two) times daily as needed (itchy eyes).  Omega-3 Fatty Acids (OMEGA-3 PO) Take 1 capsule by mouth daily.   oxyCODONE (ROXICODONE) 5 MG immediate release tablet Take 1 tablet (5 mg total) by mouth every 4 (four) hours as needed for severe pain.   pantoprazole (PROTONIX) 40 MG tablet TAKE 1 TABLET(40 MG) BY MOUTH DAILY   Polyethyl Glycol-Propyl Glycol (SYSTANE ULTRA OP) Place 1 drop into both eyes 2 (two) times daily as needed (dry eyes).   Probiotic Product (PROBIOTIC PO) Take 1 capsule by mouth daily.   RSV vaccine recomb adjuvanted (AREXVY) 120 MCG/0.5ML injection Inject into the muscle.   senna-docusate (SENOKOT-S) 8.6-50 MG tablet Take 1 tablet by  mouth 2 (two) times daily between meals as needed for moderate constipation.   simethicone (MYLICON) 125 MG chewable tablet Chew 125 mg by mouth as needed (Bloating).   simvastatin (ZOCOR) 20 MG tablet TAKE 1 TABLET(20 MG) BY MOUTH DAILY   venlafaxine XR (EFFEXOR-XR) 150 MG 24 hr capsule TAKE 1 CAPSULE BY MOUTH DAILY AT BEDTIME   vitamin B-12 (CYANOCOBALAMIN) 1000 MCG tablet Take 1,000 mcg by mouth daily.   zinc gluconate 50 MG tablet Take 50 mg by mouth daily.   No facility-administered encounter medications on file as of 01/22/2023.    Objective:   PHYSICAL EXAMINATION:    VITALS:   There were no vitals filed for this visit.      GEN:  The patient appears stated age and is in NAD. HEENT:  Normocephalic, atraumatic.  The mucous membranes are moist. The superficial temporal arteries are without ropiness or tenderness. CV:  RRR Lungs:  CTAB Neck/HEME:  There are no carotid bruits bilaterally.  Neurological examination:  Orientation: The patient is alert and oriented x3. Cranial nerves: There is good facial symmetry with minimal facial hypomimia. The speech is fluent and clear. Soft palate rises symmetrically and there is no tongue deviation. Hearing is intact to conversational tone. Sensation: Sensation is intact to light touch throughout Motor: Strength is at least antigravity x4.  Movement examination: Tone: There is nl tone today in the UE/LE Abnormal movements: none today Coordination:  There is mild with hand opening and closing on the R.  All other RAMs are good. Gait and Station: The patient has no difficulty arising out of a deep-seated chair without the use of the hands. The patient's stride length is good  I have reviewed and interpreted the following labs independently    Chemistry      Component Value Date/Time   NA 138 07/11/2022 1037   K 4.5 07/11/2022 1037   CL 104 07/11/2022 1037   CO2 27 07/11/2022 1037   BUN 20 07/11/2022 1037   CREATININE 1.24  07/11/2022 1037   CREATININE 1.38 (H) 07/04/2020 1004      Component Value Date/Time   CALCIUM 9.0 07/11/2022 1037   ALKPHOS 71 07/11/2022 1037   AST 18 07/11/2022 1037   ALT <5 07/11/2022 1037   BILITOT 0.8 07/11/2022 1037       Lab Results  Component Value Date   WBC 6.3 07/11/2022   HGB 13.0 07/11/2022   HCT 40.4 07/11/2022   MCV 99.8 07/11/2022   PLT 173 07/11/2022    Lab Results  Component Value Date   TSH 1.66 05/14/2022      Cc:  Bradd Canary, MD

## 2023-01-18 NOTE — Telephone Encounter (Signed)
Pt made call to follow up on previous call regarding his visit on 11/12/22. Please review and advise patient.

## 2023-01-22 ENCOUNTER — Ambulatory Visit: Payer: Medicare Other | Admitting: Neurology

## 2023-01-29 ENCOUNTER — Other Ambulatory Visit: Payer: Self-pay | Admitting: Family Medicine

## 2023-02-01 NOTE — Progress Notes (Unsigned)
  Assessment/Plan:   1.  Parkinsons Disease  -Continue carbidopa/levodopa 25/100, 2 at 7am, 1 at 10:30, 1 at 2 pm, 1 at 5:30 pm  -exercise  -He follows regularly with dermatology.  -he wants to donate brain/body to Parkinsons Disease/science.  Information given on this.   2.  RBD  -Continue clonazepam 0.5 mg at bedtime   3.  New onset paroxysmal A-fib, February 2024  -Following with cardiology at [facility] and now on Xarelto  Subjective:   Paul Drake was seen today in follow up for Parkinsons disease.  My previous records were reviewed prior to todays visit as well as outside records available to me.  Patient doing fairly well from a Parkinson's standpoint.  He was admitted to the hospital at [facility] in February for chest pain and lightheadedness.  Patient was at a women's basketball game earlier in the day and stood up at the end of the game and felt weak and lightheaded.  They left the game and went to lunch and he had chest pain while at lunch and went to the emergency room to be evaluated.  Initial EKG with A-fib with heart rate in the 110s.  Patient had conversion to normal sinus rhythm.  He was discharged on Xarelto.  He followed back up with cardiology March 8.  Notes are reviewed.  Separately, patient has also been following with [facility] for kidney stones.  Current prescribed movement disorder medications: Carbidopa/levodopa 25/100, 2 at 7am, 1 at 10:30, 1 at 2 pm, 1 at 5:30 pm (increased last visit) Clonazepam 0.5 mg at bedtime (PDMP reviewed.  Last filled December 31, 2022)   ALLERGIES:   Allergies  Allergen Reactions   Molds & Smuts Other (See Comments)    Runny nose Itchy eyes    Pollen Extract Other (See Comments)    Runny nose Itchy  eyes      CURRENT MEDICATIONS:  Outpatient Encounter Medications as of 01/22/2023  Medication Sig   acetaminophen (TYLENOL) 500 MG tablet Take 1,000 mg by mouth daily as needed for mild pain or headache.   albuterol (VENTOLIN HFA) 108  (90 Base) MCG/ACT inhaler Inhale 2 puffs into the lungs every 6 (six) hours as needed for wheezing or shortness of breath.   Ascorbic Acid (VITAMIN C) 500 MG CHEW Chew 500 mg by mouth daily.   carbidopa-levodopa (SINEMET IR) 25-100 MG tablet TAKE 2 TABLETS BY MOUTH AT 07:00 AM, 1 TABLET AT 10:30 AM, 1 TABLET AT 2:00 PM, AND 1 TABLET AT 5:30 PM   cetirizine (ZYRTEC) 10 MG tablet Take 10 mg by mouth daily.   clonazePAM (KLONOPIN) 0.5 MG tablet Take 1 tablet (0.5 mg total) by mouth at bedtime.   clopidogrel (PLAVIX) 75 MG tablet TAKE 1 TABLET(75 MG) BY MOUTH DAILY   COVID-19 mRNA vaccine 2023-2024 (COMIRNATY) syringe Inject into the muscle.   finasteride (PROSCAR) 5 MG tablet Take 5 mg by mouth daily.   fluticasone (FLONASE) 50 MCG/ACT nasal spray Place 1 spray into both nostrils daily as needed for allergies.   gabapentin (NEURONTIN) 100 MG capsule TAKE 1 CAPSULE BY MOUTH EVERY 4 HOURS   influenza vaccine adjuvanted (FLUAD QUADRIVALENT) 0.5 ML injection Inject into the muscle.   Menthol, Topical Analgesic, (BIOFREEZE EX) Apply 1 application topically daily as needed (hand pain).   Multiple Vitamins-Minerals (MULTIVITAMINS THER. W/MINERALS) TABS tablet Take 1 tablet by mouth daily.   Naphazoline-Pheniramine (VISINE-A OP) Place 1 drop into both eyes 2 (two) times daily as needed (itchy eyes).     Omega-3 Fatty Acids (OMEGA-3 PO) Take 1 capsule by mouth daily.   oxyCODONE (ROXICODONE) 5 MG immediate release tablet Take 1 tablet (5 mg total) by mouth every 4 (four) hours as needed for severe pain.   pantoprazole (PROTONIX) 40 MG tablet TAKE 1 TABLET(40 MG) BY MOUTH DAILY   Polyethyl Glycol-Propyl Glycol (SYSTANE ULTRA OP) Place 1 drop into both eyes 2 (two) times daily as needed (dry eyes).   Probiotic Product (PROBIOTIC PO) Take 1 capsule by mouth daily.   RSV vaccine recomb adjuvanted (AREXVY) 120 MCG/0.5ML injection Inject into the muscle.   senna-docusate (SENOKOT-S) 8.6-50 MG tablet Take 1 tablet by  mouth 2 (two) times daily between meals as needed for moderate constipation.   simethicone (MYLICON) 125 MG chewable tablet Chew 125 mg by mouth as needed (Bloating).   simvastatin (ZOCOR) 20 MG tablet TAKE 1 TABLET(20 MG) BY MOUTH DAILY   venlafaxine XR (EFFEXOR-XR) 150 MG 24 hr capsule TAKE 1 CAPSULE BY MOUTH DAILY AT BEDTIME   vitamin B-12 (CYANOCOBALAMIN) 1000 MCG tablet Take 1,000 mcg by mouth daily.   zinc gluconate 50 MG tablet Take 50 mg by mouth daily.   No facility-administered encounter medications on file as of 01/22/2023.    Objective:   PHYSICAL EXAMINATION:    VITALS:   There were no vitals filed for this visit.      GEN:  The patient appears stated age and is in NAD. HEENT:  Normocephalic, atraumatic.  The mucous membranes are moist. The superficial temporal arteries are without ropiness or tenderness. CV:  RRR Lungs:  CTAB Neck/HEME:  There are no carotid bruits bilaterally.  Neurological examination:  Orientation: The patient is alert and oriented x3. Cranial nerves: There is good facial symmetry with minimal facial hypomimia. The speech is fluent and clear. Soft palate rises symmetrically and there is no tongue deviation. Hearing is intact to conversational tone. Sensation: Sensation is intact to light touch throughout Motor: Strength is at least antigravity x4.  Movement examination: Tone: There is nl tone today in the UE/LE Abnormal movements: none today Coordination:  There is mild with hand opening and closing on the R.  All other RAMs are good. Gait and Station: The patient has no difficulty arising out of a deep-seated chair without the use of the hands. The patient's stride length is good  I have reviewed and interpreted the following labs independently    Chemistry      Component Value Date/Time   NA 138 07/11/2022 1037   K 4.5 07/11/2022 1037   CL 104 07/11/2022 1037   CO2 27 07/11/2022 1037   BUN 20 07/11/2022 1037   CREATININE 1.24  07/11/2022 1037   CREATININE 1.38 (H) 07/04/2020 1004      Component Value Date/Time   CALCIUM 9.0 07/11/2022 1037   ALKPHOS 71 07/11/2022 1037   AST 18 07/11/2022 1037   ALT <5 07/11/2022 1037   BILITOT 0.8 07/11/2022 1037       Lab Results  Component Value Date   WBC 6.3 07/11/2022   HGB 13.0 07/11/2022   HCT 40.4 07/11/2022   MCV 99.8 07/11/2022   PLT 173 07/11/2022    Lab Results  Component Value Date   TSH 1.66 05/14/2022      Cc:  Blyth, Stacey A, MD 

## 2023-02-05 ENCOUNTER — Ambulatory Visit (INDEPENDENT_AMBULATORY_CARE_PROVIDER_SITE_OTHER): Payer: Medicare Other | Admitting: Neurology

## 2023-02-05 ENCOUNTER — Encounter: Payer: Self-pay | Admitting: Neurology

## 2023-02-05 VITALS — BP 126/72 | HR 71 | Ht 68.0 in | Wt 173.0 lb

## 2023-02-05 DIAGNOSIS — G4752 REM sleep behavior disorder: Secondary | ICD-10-CM | POA: Diagnosis not present

## 2023-02-05 DIAGNOSIS — G20A1 Parkinson's disease without dyskinesia, without mention of fluctuations: Secondary | ICD-10-CM

## 2023-02-05 DIAGNOSIS — I480 Paroxysmal atrial fibrillation: Secondary | ICD-10-CM | POA: Diagnosis not present

## 2023-02-17 ENCOUNTER — Other Ambulatory Visit: Payer: Self-pay | Admitting: Family Medicine

## 2023-03-04 ENCOUNTER — Other Ambulatory Visit: Payer: Self-pay

## 2023-03-04 DIAGNOSIS — G20A1 Parkinson's disease without dyskinesia, without mention of fluctuations: Secondary | ICD-10-CM

## 2023-03-04 MED ORDER — CLONAZEPAM 0.5 MG PO TABS: 0.5000 mg | ORAL_TABLET | Freq: Every day | ORAL | 5 refills | Status: DC

## 2023-03-11 NOTE — Assessment & Plan Note (Signed)
Following with neurology. Tolerating Sinemet.

## 2023-03-11 NOTE — Assessment & Plan Note (Signed)
No recent exacerbation 

## 2023-03-11 NOTE — Assessment & Plan Note (Signed)
hgba1c acceptable, minimize simple carbs. Increase exercise as tolerated.  

## 2023-03-11 NOTE — Assessment & Plan Note (Signed)
Rate controlled, tolerating Plavix

## 2023-03-11 NOTE — Assessment & Plan Note (Signed)
Well controlled, no changes to meds. Encouraged heart healthy diet such as the DASH diet and exercise as tolerated.  °

## 2023-03-11 NOTE — Progress Notes (Unsigned)
Subjective:    Patient ID: Paul Drake, male    DOB: 1946-05-14, 77 y.o.   MRN: [id number]  No chief complaint on file.   HPI Discussed the use of AI scribe software for clinical note transcription with the patient, who gave verbal consent to proceed.  History of Present Illness        Patient is 77 year old male in today for follow up on chronic medical concerns. No recent febrile illness or hospitalizations. Denies CP/palp/SOB/HA/congestion/fevers/GI or GU c/o. Taking meds as prescribed     Past Medical History:  Diagnosis Date  . Allergic state 12/29/2013  . Anxiety   . Arthritis   . Barrett's esophagus   . BPH (benign prostatic hyperplasia)   . Chicken pox   . CVA (cerebral infarction)   . Depression   . GERD (gastroesophageal reflux disease)   . History of kidney stones   . HTN (hypertension), benign 04/01/2015  . Hyperglycemia 01/06/2015  . Hyperlipemia   . Hypertension   . IBS (irritable bowel syndrome)   . Left hand pain 07/21/2017  . Measles as a child  . Medicare annual wellness visit, subsequent 06/26/2015  . Mumps as a child  . Otitis externa 12/10/2013  . Parkinson disease   . PFO (patent foramen ovale)   . Seasonal allergies    some asthma  . Skin cancer of face   . Stroke (HCC)   . Unspecified asthma(493.90) 12/29/2013    Past Surgical History:  Procedure Laterality Date  . COLONOSCOPY    . EP IMPLANTABLE DEVICE N/A 10/16/2016   Procedure: Loop Recorder Insertion;  Surgeon: Hillis Range, MD;  Location: [facility];  Service: Cardiovascular;  Laterality: N/A;  . GANGLION CYST EXCISION Left 06/08/2021   Procedure: LEFT VOLAR GANGLION EXCISION;  Surgeon: Marlyne Beards, MD;  Location: [facility];  Service: Orthopedics;  Laterality: Left;  . implantable loop recorder removal  09/21/2020   Reveal LINQ removed in office  . LITHOTRIPSY     multiple times  . PROSTATE SURGERY  08/2017  . TEE WITHOUT CARDIOVERSION N/A 09/22/2014    Procedure: TRANSESOPHAGEAL ECHOCARDIOGRAM (TEE);  Surgeon: Wendall Stade, MD;  Location: [facility] ENDOSCOPY;  Service: Cardiovascular;  Laterality: N/A;  . WISDOM TOOTH EXTRACTION      Family History  Problem Relation Age of Onset  . Hypertension Mother   . Hyperlipidemia Mother   . Fibromyalgia Mother   . Arthritis Mother        rheumatoid  . Ulcers Father 36       Bleeding Ulcers  . Heart disease Sister        heart failure  . Diabetes Sister        type 2  . Hyperlipidemia Brother   . Hypertension Brother   . Prostate cancer Brother   . Kidney Stones Daughter   . Anorexia nervosa Daughter   . Asthma Daughter   . Bulemia Daughter   . Healthy Son   . Stroke Maternal Aunt   . Cancer Maternal Uncle        prostate  . Cancer Maternal Grandfather        skin ?    Social History   Socioeconomic History  . Marital status: Married    Spouse name: Thurston Hole  . Number of children: 3  . Years of education: college  . Highest education level: Master's degree (e.g., MA, MS, MEng, MEd, MSW, MBA)  Occupational History  . Occupation: retired  Comment: retired  Tobacco Use  . Smoking status: Never  . Smokeless tobacco: Never  Vaping Use  . Vaping Use: Never used  Substance and Sexual Activity  . Alcohol use: Yes    Alcohol/week: 1.0 standard drink of alcohol    Types: 1 Shots of liquor per week    Comment: social  . Drug use: No  . 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 Not on file  Social History Narrative   Patient consumes 3 cups of caffeine daily   Left handed   Lives at home with his wife. They are living in their daughter's house to help to help take care of their granddaughter.      Patient uses a home gym 3-4 x a week.      Social Determinants of Health   Financial Resource Strain: Low Risk  (08/01/2021)   Overall Financial Resource Strain (CARDIA)   . Difficulty of Paying  Living Expenses: Not hard at all  Food Insecurity: No Food Insecurity (08/13/2022)   Hunger Vital Sign   . Worried About Programme researcher, broadcasting/film/video in the Last Year: Never true   . Ran Out of Food in the Last Year: Never true  Transportation Needs: No Transportation Needs (08/13/2022)   PRAPARE - Transportation   . Lack of Transportation (Medical): No   . Lack of Transportation (Non-Medical): No  Physical Activity: Insufficiently Active (08/01/2021)   Exercise Vital Sign   . Days of Exercise per Week: 1 day   . Minutes of Exercise per Session: 60 min  Stress: No Stress Concern Present (08/01/2021)   [employer] of Occupational Health - Occupational Stress Questionnaire   . Feeling of Stress : Not at all  Social Connections: Moderately Isolated (08/01/2021)   Social Connection and Isolation Panel [NHANES]   . Frequency of Communication with Friends and Family: More than three times a week   . Frequency of Social Gatherings with Friends and Family: More than three times a week   . Attends Religious Services: Never   . Active Member of Clubs or Organizations: No   . Attends Banker Meetings: Never   . Marital Status: Married  Catering manager Violence: Not At Risk (08/13/2022)   Humiliation, Afraid, Rape, and Kick questionnaire   . Fear of Current or Ex-Partner: No   . Emotionally Abused: No   . Physically Abused: No   . Sexually Abused: No    Outpatient Medications Prior to Visit  Medication Sig Dispense Refill  . acetaminophen (TYLENOL) 500 MG tablet Take 1,000 mg by mouth daily as needed for mild pain or headache.    . albuterol (VENTOLIN HFA) 108 (90 Base) MCG/ACT inhaler Inhale 2 puffs into the lungs every 6 (six) hours as needed for wheezing or shortness of breath. 1 each 3  . Ascorbic Acid (VITAMIN C) 500 MG CHEW Chew 500 mg by mouth daily.    . carbidopa-levodopa (SINEMET IR) 25-100 MG tablet TAKE 2 TABLETS BY MOUTH AT 07:00 AM, 1 TABLET AT 10:30 AM, 1 TABLET AT  2:00 PM, AND 1 TABLET AT 5:30 PM 450 tablet 1  . cetirizine (ZYRTEC) 10 MG tablet Take 10 mg by mouth daily.    . clonazePAM (KLONOPIN) 0.5 MG tablet Take 1 tablet (0.5 mg total) by mouth at bedtime. 30 tablet 5  . clopidogrel (PLAVIX) 75 MG tablet TAKE 1 TABLET(75 MG) BY MOUTH DAILY  90 tablet 3  . COVID-19 mRNA vaccine 2023-2024 (COMIRNATY) syringe Inject into the muscle. 0.3 mL 0  . finasteride (PROSCAR) 5 MG tablet Take 5 mg by mouth daily.    . fluticasone (FLONASE) 50 MCG/ACT nasal spray Place 1 spray into both nostrils daily as needed for allergies.    Marland Kitchen gabapentin (NEURONTIN) 100 MG capsule TAKE 1 CAPSULE BY MOUTH EVERY 4 HOURS 540 capsule 0  . influenza vaccine adjuvanted (FLUAD QUADRIVALENT) 0.5 ML injection Inject into the muscle. 0.5 mL 0  . Menthol, Topical Analgesic, (BIOFREEZE EX) Apply 1 application topically daily as needed (hand pain).    . Multiple Vitamins-Minerals (MULTIVITAMINS THER. W/MINERALS) TABS tablet Take 1 tablet by mouth daily.    . Naphazoline-Pheniramine (VISINE-A OP) Place 1 drop into both eyes 2 (two) times daily as needed (itchy eyes).    . Omega-3 Fatty Acids (OMEGA-3 PO) Take 1 capsule by mouth daily.    . pantoprazole (PROTONIX) 40 MG tablet Take 1 tablet (40 mg total) by mouth daily. 90 tablet 1  . Polyethyl Glycol-Propyl Glycol (SYSTANE ULTRA OP) Place 1 drop into both eyes 2 (two) times daily as needed (dry eyes).    . Probiotic Product (PROBIOTIC PO) Take 1 capsule by mouth daily.    Marland Kitchen senna-docusate (SENOKOT-S) 8.6-50 MG tablet Take 1 tablet by mouth 2 (two) times daily between meals as needed for moderate constipation.  0  . simethicone (MYLICON) 125 MG chewable tablet Chew 125 mg by mouth as needed (Bloating).    . simvastatin (ZOCOR) 20 MG tablet TAKE 1 TABLET(20 MG) BY MOUTH DAILY 90 tablet 1  . venlafaxine XR (EFFEXOR-XR) 150 MG 24 hr capsule TAKE 1 CAPSULE BY MOUTH DAILY AT BEDTIME 90 capsule 1  . vitamin B-12 (CYANOCOBALAMIN) 1000 MCG tablet Take  1,000 mcg by mouth daily.    Carlena Hurl 20 MG TABS tablet Take 20 mg by mouth daily.    Marland Kitchen zinc gluconate 50 MG tablet Take 50 mg by mouth daily.    Marland Kitchen oxyCODONE (ROXICODONE) 5 MG immediate release tablet Take 1 tablet (5 mg total) by mouth every 4 (four) hours as needed for severe pain. 5 tablet 0  . RSV vaccine recomb adjuvanted (AREXVY) 120 MCG/0.5ML injection Inject into the muscle. 0.5 mL 0   No facility-administered medications prior to visit.    Allergies  Allergen Reactions  . Molds & Smuts Other (See Comments)    Runny nose Itchy eyes   . Pollen Extract Other (See Comments)    Runny nose Itchy  eyes      Review of Systems  Constitutional:  Negative for fever and malaise/fatigue.  HENT:  Negative for congestion.   Eyes:  Negative for blurred vision.  Respiratory:  Negative for shortness of breath.   Cardiovascular:  Negative for chest pain, palpitations and leg swelling.  Gastrointestinal:  Negative for abdominal pain, blood in stool and nausea.  Genitourinary:  Negative for dysuria and frequency.  Musculoskeletal:  Negative for falls.  Skin:  Negative for rash.  Neurological:  Negative for dizziness, loss of consciousness and headaches.  Endo/Heme/Allergies:  Negative for environmental allergies.  Psychiatric/Behavioral:  Negative for depression. The patient is not nervous/anxious.       Objective:    Physical Exam Vitals reviewed.  Constitutional:      Appearance: Normal appearance. He is not ill-appearing.  HENT:     Head: Normocephalic and atraumatic.     Nose: Nose normal.  Eyes:     Conjunctiva/sclera:  Conjunctivae normal.  Cardiovascular:     Rate and Rhythm: Normal rate.     Pulses: Normal pulses.     Heart sounds: Normal heart sounds. No murmur heard. Pulmonary:     Effort: Pulmonary effort is normal.     Breath sounds: Normal breath sounds. No wheezing.  Abdominal:     Palpations: Abdomen is soft. There is no mass.     Tenderness: There is no  abdominal tenderness.  Musculoskeletal:     Cervical back: Normal range of motion.     Right lower leg: No edema.     Left lower leg: No edema.  Skin:    General: Skin is warm and dry.  Neurological:     General: No focal deficit present.     Mental Status: He is alert and oriented to person, place, and time.  Psychiatric:        Mood and Affect: Mood normal.   There were no vitals taken for this visit. Wt Readings from Last 3 Encounters:  02/05/23 173 lb (78.5 kg)  11/12/22 177 lb 3.2 oz (80.4 kg)  10/16/22 175 lb (79.4 kg)    Diabetic Foot Exam - Simple   No data filed    Lab Results  Component Value Date   WBC 6.3 07/11/2022   HGB 13.0 07/11/2022   HCT 40.4 07/11/2022   PLT 173 07/11/2022   GLUCOSE 108 (H) 07/11/2022   CHOL 151 05/14/2022   TRIG 97.0 05/14/2022   HDL 64.20 05/14/2022   LDLDIRECT 76.0 12/31/2014   LDLCALC 67 05/14/2022   ALT <5 07/11/2022   AST 18 07/11/2022   NA 138 07/11/2022   K 4.5 07/11/2022   CL 104 07/11/2022   CREATININE 1.24 07/11/2022   BUN 20 07/11/2022   CO2 27 07/11/2022   TSH 1.66 05/14/2022   PSA 1.77 08/07/2022   INR 0.98 09/20/2014   HGBA1C 5.8 05/14/2022    Lab Results  Component Value Date   TSH 1.66 05/14/2022   Lab Results  Component Value Date   WBC 6.3 07/11/2022   HGB 13.0 07/11/2022   HCT 40.4 07/11/2022   MCV 99.8 07/11/2022   PLT 173 07/11/2022   Lab Results  Component Value Date   NA 138 07/11/2022   K 4.5 07/11/2022   CO2 27 07/11/2022   GLUCOSE 108 (H) 07/11/2022   BUN 20 07/11/2022   CREATININE 1.24 07/11/2022   BILITOT 0.8 07/11/2022   ALKPHOS 71 07/11/2022   AST 18 07/11/2022   ALT <5 07/11/2022   PROT 7.2 07/11/2022   ALBUMIN 4.1 07/11/2022   CALCIUM 9.0 07/11/2022   ANIONGAP 7 07/11/2022   GFR 55.91 (L) 05/14/2022   Lab Results  Component Value Date   CHOL 151 05/14/2022   Lab Results  Component Value Date   HDL 64.20 05/14/2022   Lab Results  Component Value Date   LDLCALC  67 05/14/2022   Lab Results  Component Value Date   TRIG 97.0 05/14/2022   Lab Results  Component Value Date   CHOLHDL 2 05/14/2022   Lab Results  Component Value Date   HGBA1C 5.8 05/14/2022       Assessment & Plan:  Essential hypertension Assessment & Plan: Well controlled, no changes to meds. Encouraged heart healthy diet such as the DASH diet and exercise as tolerated.     Hyperglycemia Assessment & Plan: hgba1c acceptable, minimize simple carbs. Increase exercise as tolerated.    Uncomplicated asthma, unspecified asthma severity, unspecified  whether persistent Assessment & Plan: No recent exacerbation   Atrial fibrillation, unspecified type (HCC) Assessment & Plan: Rate controlled, tolerating Plavix   Parkinson's disease with dyskinesia, unspecified whether manifestations fluctuate Assessment & Plan: Following with neurology. Tolerating Sinemet.      Assessment and Plan              Danise Edge, MD

## 2023-03-12 ENCOUNTER — Ambulatory Visit (INDEPENDENT_AMBULATORY_CARE_PROVIDER_SITE_OTHER): Payer: Medicare Other | Admitting: Family Medicine

## 2023-03-12 ENCOUNTER — Encounter: Payer: Self-pay | Admitting: Family Medicine

## 2023-03-12 VITALS — BP 124/70 | HR 73 | Temp 97.5°F | Resp 16 | Ht 68.0 in | Wt 171.6 lb

## 2023-03-12 DIAGNOSIS — I10 Essential (primary) hypertension: Secondary | ICD-10-CM | POA: Diagnosis not present

## 2023-03-12 DIAGNOSIS — M79642 Pain in left hand: Secondary | ICD-10-CM | POA: Diagnosis not present

## 2023-03-12 DIAGNOSIS — G20B1 Parkinson's disease with dyskinesia, without mention of fluctuations: Secondary | ICD-10-CM

## 2023-03-12 DIAGNOSIS — R7989 Other specified abnormal findings of blood chemistry: Secondary | ICD-10-CM

## 2023-03-12 DIAGNOSIS — R42 Dizziness and giddiness: Secondary | ICD-10-CM

## 2023-03-12 DIAGNOSIS — J45909 Unspecified asthma, uncomplicated: Secondary | ICD-10-CM

## 2023-03-12 DIAGNOSIS — I4891 Unspecified atrial fibrillation: Secondary | ICD-10-CM

## 2023-03-12 DIAGNOSIS — R739 Hyperglycemia, unspecified: Secondary | ICD-10-CM

## 2023-03-12 LAB — COMPREHENSIVE METABOLIC PANEL
ALT: 4 U/L (ref 0–53)
AST: 15 U/L (ref 0–37)
Albumin: 4.1 g/dL (ref 3.5–5.2)
Alkaline Phosphatase: 66 U/L (ref 39–117)
BUN: 18 mg/dL (ref 6–23)
CO2: 29 mEq/L (ref 19–32)
Calcium: 9.5 mg/dL (ref 8.4–10.5)
Chloride: 104 mEq/L (ref 96–112)
Creatinine, Ser: 1.1 mg/dL (ref 0.40–1.50)
GFR: 64.18 mL/min (ref >60.00)
Glucose, Bld: 94 mg/dL (ref 70–99)
Potassium: 4.3 mEq/L (ref 3.5–5.1)
Sodium: 140 mEq/L (ref 135–145)
Total Bilirubin: 0.6 mg/dL (ref 0.2–1.2)
Total Protein: 6.5 g/dL (ref 6.0–8.3)

## 2023-03-12 LAB — CBC WITH DIFFERENTIAL/PLATELET
Basophils Absolute: 0.1 10*3/uL (ref 0.0–0.1)
Basophils Relative: 1 % (ref 0.0–3.0)
Eosinophils Absolute: 0.8 10*3/uL — ABNORMAL HIGH (ref 0.0–0.7)
Eosinophils Relative: 12.7 % — ABNORMAL HIGH (ref 0.0–5.0)
HCT: 39.1 % (ref 39.0–52.0)
Hemoglobin: 12.8 g/dL — ABNORMAL LOW (ref 13.0–17.0)
Lymphocytes Relative: 21.9 % (ref 12.0–46.0)
Lymphs Abs: 1.5 10*3/uL (ref 0.7–4.0)
MCHC: 32.7 g/dL (ref 30.0–36.0)
MCV: 98.5 fl (ref 78.0–100.0)
Monocytes Absolute: 0.6 10*3/uL (ref 0.1–1.0)
Monocytes Relative: 8.5 % (ref 3.0–12.0)
Neutro Abs: 3.7 10*3/uL (ref 1.4–7.7)
Neutrophils Relative %: 55.9 % (ref 43.0–77.0)
Platelets: 180 10*3/uL (ref 150.0–400.0)
RBC: 3.97 Mil/uL — ABNORMAL LOW (ref 4.22–5.81)
RDW: 13.2 % (ref 11.5–15.5)
WBC: 6.6 10*3/uL (ref 4.0–10.5)

## 2023-03-12 LAB — LIPID PANEL
Cholesterol: 146 mg/dL (ref 0–200)
HDL: 56.9 mg/dL (ref >39.00)
LDL Cholesterol: 75 mg/dL (ref 0–99)
NonHDL: 89.45
Total CHOL/HDL Ratio: 3
Triglycerides: 70 mg/dL (ref 0.0–149.0)
VLDL: 14 mg/dL (ref 0.0–40.0)

## 2023-03-12 LAB — HEMOGLOBIN A1C: Hgb A1c MFr Bld: 5.5 % (ref 4.6–6.5)

## 2023-03-12 LAB — TESTOSTERONE: Testosterone: 1034.89 ng/dL — ABNORMAL HIGH (ref 300.00–890.00)

## 2023-03-12 LAB — TSH: TSH: 1.38 u[IU]/mL (ref 0.35–5.50)

## 2023-03-12 MED ORDER — GABAPENTIN 300 MG PO CAPS: ORAL_CAPSULE | ORAL | 1 refills | Status: AC

## 2023-03-12 NOTE — Patient Instructions (Signed)
Parkinson's Disease Parkinson's disease is a movement disorder. It is a long-term condition that gets worse over time. Each person with Parkinson's disease is affected differently. This condition limits a person's ability to control movements and move the body normally. The condition can range from mild to severe. Parkinson's disease tends to get worse slowly over several years. What are the causes? Parkinson's disease is caused by a loss of brain cells (neurons) that make a brain chemical called dopamine. Dopamine is needed to control movement. As the condition gets worse, more neurons that make dopamine die. This makes it hard to move or control your movements. The exact cause of the loss of neurons is not known. Genes and the environment may contribute to the cause of Parkinson's disease. What increases the risk? The following factors may make you more likely to develop this condition: Being male. Being age 60 or older. Having a family history of Parkinson's disease. Having had a traumatic brain injury. Having been exposed to toxins, such as pesticides. Having depression. What are the signs or symptoms? Symptoms of this condition can vary. The main symptoms are related to movement. These include: A tremor or shaking while you are resting. You cannot control the shaking. Stiffness in your arms and legs (rigidity). Slowing of movement. You may lose facial expressions and have trouble making small movements that are needed to button clothing or brush your teeth. An abnormal walk. You may walk with short, shuffling steps. Loss of balance and stability when standing. You may sway, fall backward, and have trouble making turns. Other symptoms include: Mental or cognitive changes, including: Depression or anxiety. Having false beliefs (delusions). Seeing, hearing, or feeling things that do not exist (hallucinations). Trouble speaking or swallowing. Changes in bowel or bladder functions,  including constipation, having to go urgently or frequently, or not being able to control your bowel or bladder. Changes in sleep habits, acting out dreams, or trouble sleeping. Depending on the severity of the symptoms, Parkinson's disease may be mild, moderate, or advanced. Parkinson's disease progression is different for everyone. Some people may not progress to the advanced stage. Mild Parkinson's disease involves: Movement problems that do not affect daily activities. Movement problems on one side of the body. Moderate Parkinson's disease involves: Movement problems on both sides of the body. Slowing of movement. Coordination and balance problems. Advanced Parkinson's disease involves: Extreme difficulty walking. Inability to live alone safely. Signs of dementia, such as having trouble remembering things, doing daily tasks such as getting dressed, and problem solving. How is this diagnosed? This condition is diagnosed by a specialist. A diagnosis may be made based on symptoms, your medical history, and a physical exam. You may also have brain imaging tests to check for loss of neurons in the brain. How is this treated? There is no cure for Parkinson's disease. Treatment focuses on managing your symptoms. Treatment may include: Medicines. Everyone responds to medicines differently. Your response may change over time. Work with your health care provider to find the best medicines for you. Speech, occupational, and physical therapy. Deep brain stimulation surgery to reduce tremors and other involuntary movements. Follow these instructions at home: Medicines Take over-the-counter and prescription medicines only as told by your health care provider. Avoid taking medicines that can affect thinking, such as pain or sleeping medicines. Eating and drinking Follow instructions from your health care provider about eating or drinking restrictions. Do not drink alcohol. Activity Ask your health  care provider if it is safe for you   to drive. Do exercises as told by your health care provider or physical therapist. Lifestyle  Install grab bars and railings in your home to prevent falls. Do not use any products that contain nicotine or tobacco. These products include cigarettes, chewing tobacco, and vaping devices, such as e-cigarettes. If you need help quitting, ask your health care provider. Consider joining a support group for people with Parkinson's disease. General instructions Work with your health care provider to know the kind of day-to-day help that you may need and what to do to stay safe. Keep all follow-up visits. This is important. Follow-up visits include any visits with a physical therapist, speech therapist, or occupational therapist. Where to find more information National Institute of Neurological Disorders and Stroke: www.ninds.nih.gov Parkinson's Foundation: www.parkinson.org Contact a health care provider if: Medicines do not help your symptoms. You are unsteady or have fallen at home. You need more support to function well at home. You have trouble swallowing. You have severe constipation. You are having problems with side effects from your medicines. You feel confused, anxious, depressed, or have hallucinations. Get help right away if you: Are injured after a fall. Cannot swallow without choking. Have chest pain or trouble breathing. Do not feel safe at home. Have thoughts about hurting yourself or others. These symptoms may represent a serious problem that is an emergency. Do not wait to see if the symptoms will go away. Get medical help right away. Call your local emergency services (911 in the [place] Do not drive yourself to the hospital. If you ever feel like you may hurt yourself or others, or have thoughts about taking your own life, get help right away. Go to your nearest emergency department or: Call your local emergency services (911 in the  [place] Call a suicide crisis helpline, such as the National Suicide Prevention Lifeline at 1-800-273-8255 or 988 in the [place] This is open 24 hours a day in the [place] Text the Crisis Text Line at 741741 (in the [place] Summary Parkinson's disease is a long-term condition that gets worse over time. This condition limits your ability to control your movements and move your body normally. There is no cure for Parkinson's disease. Treatment focuses on managing your symptoms. Work with your health care provider to know the kind of day-to-day help that you may need and what to do to stay safe. Keep all follow-up visits, including any visits with a physical therapist, speech therapist, or occupational therapist. This is important. This information is not intended to replace advice given to you by your health care provider. Make sure you discuss any questions you have with your health care provider. Document Revised: 03/29/2021 Document Reviewed: 12/19/2020 Elsevier Patient Education  2024 [employer]vier Inc.  

## 2023-03-12 NOTE — Assessment & Plan Note (Signed)
Has occasional epise

## 2023-03-12 NOTE — Assessment & Plan Note (Signed)
Increasing Gabapentin 300 mg tid has helped his pain but it is worse after gripping for an extended period of time. Will increase to 300 mg bid and 600 at bedtime and reevaluate

## 2023-03-13 ENCOUNTER — Other Ambulatory Visit: Payer: Self-pay

## 2023-03-13 DIAGNOSIS — E349 Endocrine disorder, unspecified: Secondary | ICD-10-CM

## 2023-04-23 ENCOUNTER — Other Ambulatory Visit: Payer: Self-pay | Admitting: Family Medicine

## 2023-05-02 ENCOUNTER — Encounter (INDEPENDENT_AMBULATORY_CARE_PROVIDER_SITE_OTHER): Payer: Self-pay

## 2023-05-03 ENCOUNTER — Other Ambulatory Visit: Payer: Self-pay | Admitting: Neurology

## 2023-05-03 DIAGNOSIS — G20A1 Parkinson's disease without dyskinesia, without mention of fluctuations: Secondary | ICD-10-CM

## 2023-05-21 ENCOUNTER — Other Ambulatory Visit: Payer: Self-pay | Admitting: Family Medicine

## 2023-05-21 DIAGNOSIS — N401 Enlarged prostate with lower urinary tract symptoms: Secondary | ICD-10-CM | POA: Diagnosis not present

## 2023-05-21 DIAGNOSIS — N138 Other obstructive and reflux uropathy: Secondary | ICD-10-CM | POA: Diagnosis not present

## 2023-05-21 DIAGNOSIS — R82991 Hypocitraturia: Secondary | ICD-10-CM | POA: Diagnosis not present

## 2023-05-21 DIAGNOSIS — N200 Calculus of kidney: Secondary | ICD-10-CM | POA: Diagnosis not present

## 2023-05-25 DIAGNOSIS — M25531 Pain in right wrist: Secondary | ICD-10-CM | POA: Diagnosis not present

## 2023-06-01 DIAGNOSIS — L03818 Cellulitis of other sites: Secondary | ICD-10-CM | POA: Diagnosis not present

## 2023-06-02 ENCOUNTER — Other Ambulatory Visit: Payer: Self-pay | Admitting: Neurology

## 2023-06-03 DIAGNOSIS — M1811 Unilateral primary osteoarthritis of first carpometacarpal joint, right hand: Secondary | ICD-10-CM | POA: Diagnosis not present

## 2023-06-03 DIAGNOSIS — M79644 Pain in right finger(s): Secondary | ICD-10-CM | POA: Diagnosis not present

## 2023-06-03 DIAGNOSIS — S6991XD Unspecified injury of right wrist, hand and finger(s), subsequent encounter: Secondary | ICD-10-CM | POA: Diagnosis not present

## 2023-06-03 DIAGNOSIS — S6991XA Unspecified injury of right wrist, hand and finger(s), initial encounter: Secondary | ICD-10-CM | POA: Diagnosis not present

## 2023-06-04 ENCOUNTER — Telehealth (INDEPENDENT_AMBULATORY_CARE_PROVIDER_SITE_OTHER): Payer: Medicare Other | Admitting: Family Medicine

## 2023-06-04 DIAGNOSIS — E785 Hyperlipidemia, unspecified: Secondary | ICD-10-CM

## 2023-06-04 DIAGNOSIS — I10 Essential (primary) hypertension: Secondary | ICD-10-CM | POA: Diagnosis not present

## 2023-06-04 DIAGNOSIS — I700 Atherosclerosis of aorta: Secondary | ICD-10-CM | POA: Diagnosis not present

## 2023-06-04 DIAGNOSIS — I4891 Unspecified atrial fibrillation: Secondary | ICD-10-CM | POA: Diagnosis not present

## 2023-06-04 DIAGNOSIS — R7989 Other specified abnormal findings of blood chemistry: Secondary | ICD-10-CM | POA: Diagnosis not present

## 2023-06-04 DIAGNOSIS — R739 Hyperglycemia, unspecified: Secondary | ICD-10-CM

## 2023-06-04 DIAGNOSIS — Z8673 Personal history of transient ischemic attack (TIA), and cerebral infarction without residual deficits: Secondary | ICD-10-CM

## 2023-06-05 ENCOUNTER — Encounter: Payer: Self-pay | Admitting: Family Medicine

## 2023-06-05 NOTE — Assessment & Plan Note (Signed)
Referred to endocrinology

## 2023-06-05 NOTE — Assessment & Plan Note (Signed)
Referred to cardiology, tolerating current meds

## 2023-06-05 NOTE — Progress Notes (Signed)
MyChart Video Visit    Virtual Visit via Video Note   This patient is at least at moderate risk for complications without adequate follow up. This format is felt to be most appropriate for this patient at this time. Physical exam was limited by quality of the video and audio technology used for the visit. Shamiaine, CMA was able to get the patient set up on a video visit.  Patient location: home Patient and provider in visit Provider location: Office  I discussed the limitations of evaluation and management by telemedicine and the availability of in person appointments. The patient expressed understanding and agreed to proceed.  Visit Date: 06/04/2023  Today's healthcare provider: Danise Edge, MD     Subjective:    Patient ID: Paul Drake, male    DOB: 1945/12/04, 77 y.o.   MRN: [id number]  Chief Complaint  Patient presents with  . Follow-up    Follow up    HPI Discussed the use of AI scribe software for clinical note transcription with the patient, who gave verbal consent to proceed.  History of Present Illness   The patient, with a history of stroke and kidney stones, presents after a recent fall [place]. They report a loss of balance while ascending steps, leading to a backward fall. The fall resulted in a significant scrape on the shin bone of the left leg, which has been slow to heal due to neuropathy. They also sustained a potential fracture to the thumb, which was managed with a brace. The patient notes that the fall was embarrassing and scary, and they sought ER care to ensure no significant injuries were sustained.  The patient also reports a recent move to a new apartment in [place], which they describe as pleasant although the move itself was difficult. Despite the stress of the move, they are happy with their new living situation, which is on one floor and has no stairs. They note that they have not been exercising as regularly due to the move and can  tell a significant difference in their balance.  In addition to these concerns, the patient mentions that a recent blood test showed high testosterone levels. The urologist they saw recommended that they consult with an endocrinologist for further management.        Past Medical History:  Diagnosis Date  . Allergic state 12/29/2013  . Anxiety   . Arthritis   . Barrett's esophagus   . BPH (benign prostatic hyperplasia)   . Chicken pox   . CVA (cerebral infarction)   . Depression   . GERD (gastroesophageal reflux disease)   . History of kidney stones   . HTN (hypertension), benign 04/01/2015  . Hyperglycemia 01/06/2015  . Hyperlipemia   . Hypertension   . IBS (irritable bowel syndrome)   . Left hand pain 07/21/2017  . Measles as a child  . Medicare annual wellness visit, subsequent 06/26/2015  . Mumps as a child  . Otitis externa 12/10/2013  . Parkinson disease   . PFO (patent foramen ovale)   . Seasonal allergies    some asthma  . Skin cancer of face   . Stroke (HCC)   . Unspecified asthma(493.90) 12/29/2013    Past Surgical History:  Procedure Laterality Date  . COLONOSCOPY    . EP IMPLANTABLE DEVICE N/A 10/16/2016   Procedure: Loop Recorder Insertion;  Surgeon: Hillis Range, MD;  Location: [facility] INVASIVE CV LAB;  Service: Cardiovascular;  Laterality: N/A;  . GANGLION  CYST EXCISION Left 06/08/2021   Procedure: LEFT VOLAR GANGLION EXCISION;  Surgeon: Marlyne Beards, MD;  Location:  SURGERY CENTER;  Service: Orthopedics;  Laterality: Left;  . implantable loop recorder removal  09/21/2020   Reveal LINQ removed in office  . LITHOTRIPSY     multiple times  . PROSTATE SURGERY  08/2017  . TEE WITHOUT CARDIOVERSION N/A 09/22/2014   Procedure: TRANSESOPHAGEAL ECHOCARDIOGRAM (TEE);  Surgeon: Wendall Stade, MD;  Location[facility] END[facility];  Service: Cardiovascular;  Laterality: N/A;  . WISDOM TOOTH EXTRACTION      Family History  Problem Relation Age of Onset  .  Hypertension Mother   . Hyperlipidemia Mother   . Fibromyalgia Mother   . Arthritis Mother        rheumatoid  . Ulcers Father 36       Bleeding Ulcers  . Heart disease Sister        heart failure  . Diabetes Sister        type 2  . Hyperlipidemia Brother   . Hypertension Brother   . Prostate cancer Brother   . Kidney Stones Daughter   . Anorexia nervosa Daughter   . Asthma Daughter   . Bulemia Daughter   . Healthy Son   . Stroke Maternal Aunt   . Cancer Maternal Uncle        prostate  . Cancer Maternal Grandfather        skin ?    Social History   Socioeconomic History  . Marital status: Married    Spouse name: Thurston Hole  . Number of children: 3  . Years of education: college  . Highest education level: Master's degree (e.g., MA, MS, MEng, MEd, MSW, MBA)  Occupational History  . Occupation: retired    Comment: retired  Tobacco Use  . Smoking status: Never  . Smokeless tobacco: Never  Vaping Use  . Vaping status: Never Used  Substance and Sexual Activity  . Alcohol use: Yes    Alcohol/week: 1.0 standard drink of alcohol    Types: 1 Shots of liquor per week    Comment: social  . Drug use: No  . 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 Not on file  Social History Narrative   Patient consumes 3 cups of caffeine daily   Left handed   Lives at home with his wife. They are living in their daughter's house to help to help take care of their granddaughter.      Patient uses a home gym 3-4 x a week.      Social Determinants of Health   Financial Resource Strain: Low Risk  (08/01/2021)   Overall Financial Resource Strain (CARDIA)   . Difficulty of Paying Living Expenses: Not hard at all  Food Insecurity: No Food Insecurity (08/13/2022)   Hunger Vital Sign   . Worried About Programme researcher, broadcasting/film/video in the Last Year: Never true   . Ran Out of Food in the Last Year: Never true   Transportation Needs: No Transportation Needs (10/22/2022)   Received from [facility], [facility]   [facility] - Transportation   . In the past 12 months, has lack of transportation kept you from medical appointments or from getting medications?: No   . Lack of Transportation (Non-Medical): No  Physical Activity: Insufficiently Active (08/01/2021)   Exercise Vital Sign   . Days of Exercise per  Week: 1 day   . Minutes of Exercise per Session: 60 min  Stress: No Stress Concern Present (08/01/2021)   [employer] of Occupational Health - Occupational Stress Questionnaire   . Feeling of Stress : Not at all  Social Connections: Moderately Isolated (08/01/2021)   Social Connection and Isolation Panel [NHANES]   . Frequency of Communication with Friends and Family: More than three times a week   . Frequency of Social Gatherings with Friends and Family: More than three times a week   . Attends Religious Services: Never   . Active Member of Clubs or Organizations: No   . Attends Banker Meetings: Never   . Marital Status: Married  Catering manager Violence: Not At Risk (08/13/2022)   Humiliation, Afraid, Rape, and Kick questionnaire   . Fear of Current or Ex-Partner: No   . Emotionally Abused: No   . Physically Abused: No   . Sexually Abused: No    Outpatient Medications Prior to Visit  Medication Sig Dispense Refill  . acetaminophen (TYLENOL) 500 MG tablet Take 1,000 mg by mouth daily as needed for mild pain or headache.    . albuterol (VENTOLIN HFA) 108 (90 Base) MCG/ACT inhaler Inhale 2 puffs into the lungs every 6 (six) hours as needed for wheezing or shortness of breath. 1 each 3  . Ascorbic Acid (VITAMIN C) 500 MG CHEW Chew 500 mg by mouth daily.    . carbidopa-levodopa (SINEMET IR) 25-100 MG tablet TAKE 2 TABLETS BY MOUTH AT 7:00 AM, 1 TABLET AT 10:30 AM, 1 TABLET AT 2:00 PM, AND 1 TABLET AT 5:30 PM 450 tablet 0  . cetirizine  (ZYRTEC) 10 MG tablet Take 10 mg by mouth daily.    . clonazePAM (KLONOPIN) 0.5 MG tablet Take 1 tablet (0.5 mg total) by mouth at bedtime. 30 tablet 5  . clopidogrel (PLAVIX) 75 MG tablet TAKE 1 TABLET(75 MG) BY MOUTH DAILY 90 tablet 3  . finasteride (PROSCAR) 5 MG tablet Take 5 mg by mouth daily.    . fluticasone (FLONASE) 50 MCG/ACT nasal spray Place 1 spray into both nostrils daily as needed for allergies.    Marland Kitchen gabapentin (NEURONTIN) 300 MG capsule 1 cap po bid and 2 caps po qhs 360 capsule 1  . influenza vaccine adjuvanted (FLUAD QUADRIVALENT) 0.5 ML injection Inject into the muscle. 0.5 mL 0  . Menthol, Topical Analgesic, (BIOFREEZE EX) Apply 1 application topically daily as needed (hand pain).    . Multiple Vitamins-Minerals (MULTIVITAMINS THER. W/MINERALS) TABS tablet Take 1 tablet by mouth daily.    . Naphazoline-Pheniramine (VISINE-A OP) Place 1 drop into both eyes 2 (two) times daily as needed (itchy eyes).    . Omega-3 Fatty Acids (OMEGA-3 PO) Take 1 capsule by mouth daily.    . pantoprazole (PROTONIX) 40 MG tablet Take 1 tablet (40 mg total) by mouth daily. 90 tablet 1  . Polyethyl Glycol-Propyl Glycol (SYSTANE ULTRA OP) Place 1 drop into both eyes 2 (two) times daily as needed (dry eyes).    . Probiotic Product (PROBIOTIC PO) Take 1 capsule by mouth daily.    Marland Kitchen senna-docusate (SENOKOT-S) 8.6-50 MG tablet Take 1 tablet by mouth 2 (two) times daily between meals as needed for moderate constipation.  0  . simethicone (MYLICON) 125 MG chewable tablet Chew 125 mg by mouth as needed (Bloating).    . simvastatin (ZOCOR) 20 MG tablet TAKE 1 TABLET(20 MG) BY MOUTH DAILY 90 tablet 1  . venlafaxine XR (EFFEXOR-XR) 150  MG 24 hr capsule TAKE 1 CAPSULE BY MOUTH DAILY AT BEDTIME 90 capsule 1  . vitamin B-12 (CYANOCOBALAMIN) 1000 MCG tablet Take 1,000 mcg by mouth daily.    Carlena Hurl 20 MG TABS tablet Take 20 mg by mouth daily.    Marland Kitchen zinc gluconate 50 MG tablet Take 50 mg by mouth daily.    Marland Kitchen  COVID-19 mRNA vaccine 2023-2024 (COMIRNATY) syringe Inject into the muscle. 0.3 mL 0   No facility-administered medications prior to visit.    Allergies  Allergen Reactions  . Molds & Smuts Other (See Comments)    Runny nose Itchy eyes   . Pollen Extract Other (See Comments)    Runny nose Itchy  eyes      Review of Systems  Constitutional:  Positive for malaise/fatigue. Negative for fever.  HENT:  Negative for congestion.   Eyes:  Negative for blurred vision.  Respiratory:  Negative for shortness of breath.   Cardiovascular:  Negative for chest pain, palpitations and leg swelling.  Gastrointestinal:  Negative for abdominal pain, blood in stool and nausea.  Genitourinary:  Negative for dysuria and frequency.  Musculoskeletal:  Positive for falls.  Skin:  Negative for rash.  Neurological:  Negative for dizziness, loss of consciousness and headaches.  Endo/Heme/Allergies:  Negative for environmental allergies.  Psychiatric/Behavioral:  Negative for depression. The patient is not nervous/anxious.        Objective:    Physical Exam Vitals reviewed.  Constitutional:      Appearance: Normal appearance. He is not ill-appearing.  HENT:     Head: Normocephalic and atraumatic.     Nose: Nose normal.  Eyes:     Conjunctiva/sclera: Conjunctivae normal.  Pulmonary:     Effort: Pulmonary effort is normal.  Musculoskeletal:     Cervical back: Normal range of motion.  Skin:    General: Skin is warm and dry.  Neurological:     General: No focal deficit present.     Mental Status: He is alert and oriented to person, place, and time.  Psychiatric:        Mood and Affect: Mood normal.    There were no vitals taken for this visit. Wt Readings from Last 3 Encounters:  03/12/23 171 lb 9.6 oz (77.8 kg)  02/05/23 173 lb (78.5 kg)  11/12/22 177 lb 3.2 oz (80.4 kg)       Assessment & Plan:  High serum testosterone Assessment & Plan: Referred to endocrinology   Orders: -      Ambulatory referral to Endocrinology  Aortic atherosclerosis (HCC) -     Ambulatory referral to Cardiology  Atrial fibrillation, unspecified type [facility]) -     Ambulatory referral to Cardiology  History of CVA (cerebrovascular accident) -     Ambulatory referral to Cardiology  Hyperlipidemia, unspecified hyperlipidemia type -     Ambulatory referral to Cardiology  Hyperglycemia -     Ambulatory referral to Cardiology  Essential hypertension -     Ambulatory referral to Cardiology     Assessment and Plan    Fall with injury Recent fall with injury to right thumb and left shin. No fractures identified. Thumb was initially braced but patient discontinued due to functional limitations. Left shin has a large scrape that is slow to heal due to neuropathy. -Advised to keep shin wound dry and clean, and to use a blow dryer post-shower to ensure dryness. -Consider referral to wound center or plastic surgery if wound does not improve.  Elevated  testosterone Recent lab work showed elevated testosterone levels. Urologist recommended referral to endocrinology. -Referral to [facility] Endocrinology for further evaluation and management.  Atrial fibrillation Patient on dual antiplatelet therapy (Xarelto and Clopidogrel) as recommended by neurology. No recent episodes of AFib noted by patient. -Referral to [facility] Cardiology for further management and potential discussion with neurology regarding necessity of dual therapy.  General Health Maintenance / Followup Plans -Encouraged patient to resume regular exercise for overall health and balance improvement. -Advised patient to continue hydration and dietary modifications as recommended by urologist for kidney stone prevention. -Plan for potential in-person visit during family member's upcoming appointment in October. -Encouraged patient to seek local primary care provider for more accessible ongoing care.         I discussed the assessment and  treatment plan with the patient. The patient was provided an opportunity to ask questions and all were answered. The patient agreed with the plan and demonstrated an understanding of the instructions.   The patient was advised to call back or seek an in-person evaluation if the symptoms worsen or if the condition fails to improve as anticipated.  Danise Edge, MD [facility] at [facility] [phone] (phone) [phone] (fax)  [facility]

## 2023-06-10 ENCOUNTER — Telehealth: Payer: Self-pay | Admitting: Neurology

## 2023-06-10 NOTE — Telephone Encounter (Signed)
Pt called in stating Dr. Arbutus Leas had a mentor in [place] that she was going to refer the pt to since he just moved. He would like to get the name of that doctor so he can research before he decides if he wants a referral.

## 2023-06-10 NOTE — Telephone Encounter (Signed)
Called patient and gave name of the Dr . At  [facility]

## 2023-06-11 DIAGNOSIS — H2513 Age-related nuclear cataract, bilateral: Secondary | ICD-10-CM | POA: Diagnosis not present

## 2023-06-18 ENCOUNTER — Other Ambulatory Visit: Payer: Self-pay

## 2023-06-18 ENCOUNTER — Telehealth: Payer: Self-pay | Admitting: Family Medicine

## 2023-06-18 ENCOUNTER — Telehealth: Payer: Self-pay | Admitting: Neurology

## 2023-06-18 DIAGNOSIS — G20A1 Parkinson's disease without dyskinesia, without mention of fluctuations: Secondary | ICD-10-CM

## 2023-06-18 NOTE — Telephone Encounter (Signed)
Referral sent patient called.

## 2023-06-18 NOTE — Telephone Encounter (Signed)
error 

## 2023-06-18 NOTE — Telephone Encounter (Signed)
Patient called stating that Dr. Dahlia Client in [place] is needing the Patients Referral in order for him to be seen

## 2023-06-19 NOTE — Assessment & Plan Note (Signed)
Encourage heart healthy diet such as MIND or DASH diet, increase exercise, avoid trans fats, simple carbohydrates and processed foods, consider a krill or fish or flaxseed oil cap daily.  °

## 2023-06-19 NOTE — Assessment & Plan Note (Signed)
hgba1c acceptable, minimize simple carbs. Increase exercise as tolerated.  

## 2023-06-20 ENCOUNTER — Ambulatory Visit (INDEPENDENT_AMBULATORY_CARE_PROVIDER_SITE_OTHER): Payer: Medicare Other | Admitting: Family Medicine

## 2023-06-20 VITALS — BP 126/74 | HR 73 | Temp 98.0°F | Resp 16 | Ht 68.0 in | Wt 172.8 lb

## 2023-06-20 DIAGNOSIS — E785 Hyperlipidemia, unspecified: Secondary | ICD-10-CM | POA: Diagnosis not present

## 2023-06-20 DIAGNOSIS — R739 Hyperglycemia, unspecified: Secondary | ICD-10-CM

## 2023-06-20 DIAGNOSIS — Z23 Encounter for immunization: Secondary | ICD-10-CM

## 2023-06-23 ENCOUNTER — Encounter: Payer: Self-pay | Admitting: Family Medicine

## 2023-06-23 NOTE — Progress Notes (Signed)
Subjective:    Patient ID: Paul Drake, male    DOB: 1946-05-03, 77 y.o.   MRN: [id number]  Chief Complaint  Patient presents with  . Follow-up    Follow up    HPI Discussed the use of AI scribe software for clinical note transcription with the patient, who gave verbal consent to proceed.  History of Present Illness   The patient, with an unspecified medical history, is considering transitioning to a geriatrician for primary care after moving to [place], [place]. They are considering a team at [facility] faculty division, which specializes in geriatric care. They are due for lab work, but declined to have it done during this visit due to time constraints. They have not yet heard from a referred endocrinologist, and the doctor will follow up on this.        Past Medical History:  Diagnosis Date  . Allergic state 12/29/2013  . Anxiety   . Arthritis   . Barrett's esophagus   . BPH (benign prostatic hyperplasia)   . Chicken pox   . CVA (cerebral infarction)   . Depression   . GERD (gastroesophageal reflux disease)   . History of kidney stones   . HTN (hypertension), benign 04/01/2015  . Hyperglycemia 01/06/2015  . Hyperlipemia   . Hypertension   . IBS (irritable bowel syndrome)   . Left hand pain 07/21/2017  . Measles as a child  . Medicare annual wellness visit, subsequent 06/26/2015  . Mumps as a child  . Otitis externa 12/10/2013  . Parkinson disease   . PFO (patent foramen ovale)   . Seasonal allergies    some asthma  . Skin cancer of face   . Stroke (HCC)   . Unspecified asthma(493.90) 12/29/2013    Past Surgical History:  Procedure Laterality Date  . COLONOSCOPY    . EP IMPLANTABLE DEVICE N/A 10/16/2016   Procedure: Loop Recorder Insertion;  Surgeon: Hillis Range, MD;  Location: [facility];  Service: Cardiovascular;  Laterality: N/A;  . GANGLION CYST EXCISION Left 06/08/2021   Procedure: LEFT VOLAR GANGLION EXCISION;  Surgeon: Marlyne Beards, MD;  Location:  [facility];  Service: Orthopedics;  Laterality: Left;  . implantable loop recorder removal  09/21/2020   Reveal LINQ removed in office  . LITHOTRIPSY     multiple times  . PROSTATE SURGERY  08/2017  . TEE WITHOUT CARDIOVERSION N/A 09/22/2014   Procedure: TRANSESOPHAGEAL ECHOCARDIOGRAM (TEE);  Surgeon: Wendall Stade, MD;  Location: [facility] ENDOSCOPY;  Service: Cardiovascular;  Laterality: N/A;  . WISDOM TOOTH EXTRACTION      Family History  Problem Relation Age of Onset  . Hypertension Mother   . Hyperlipidemia Mother   . Fibromyalgia Mother   . Arthritis Mother        rheumatoid  . Ulcers Father 36       Bleeding Ulcers  . Heart disease Sister        heart failure  . Diabetes Sister        type 2  . Hyperlipidemia Brother   . Hypertension Brother   . Prostate cancer Brother   . Kidney Stones Daughter   . Anorexia nervosa Daughter   . Asthma Daughter   . Bulemia Daughter   . Healthy Son   . Stroke Maternal Aunt   . Cancer Maternal Uncle        prostate  . Cancer Maternal Grandfather        skin ?    Social  History   Socioeconomic History  . Marital status: Married    Spouse name: Thurston Hole  . Number of children: 3  . Years of education: college  . Highest education level: Master's degree (e.g., MA, MS, MEng, MEd, MSW, MBA)  Occupational History  . Occupation: retired    Comment: retired  Tobacco Use  . Smoking status: Never  . Smokeless tobacco: Never  Vaping Use  . Vaping status: Never Used  Substance and Sexual Activity  . Alcohol use: Yes    Alcohol/week: 1.0 standard drink of alcohol    Types: 1 Shots of liquor per week    Comment: social  . Drug use: No  . 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 Not on file  Social History Narrative   Patient consumes 3 cups of caffeine daily   Left handed   Lives at home with his wife. They are living in their daughter's  house to help to help take care of their granddaughter.      Patient uses a home gym 3-4 x a week.      Social Determinants of Health   Financial Resource Strain: Low Risk  (08/01/2021)   Overall Financial Resource Strain (CARDIA)   . Difficulty of Paying Living Expenses: Not hard at all  Food Insecurity: No Food Insecurity (08/13/2022)   Hunger Vital Sign   . Worried About Programme researcher, broadcasting/film/video in the Last Year: Never true   . Ran Out of Food in the Last Year: Never true  Transportation Needs: No Transportation Needs (10/22/2022)   Received from [facility], [facility]   [facility] - Transportation   . In the past 12 months, has lack of transportation kept you from medical appointments or from getting medications?: No   . Lack of Transportation (Non-Medical): No  Physical Activity: Insufficiently Active (08/01/2021)   Exercise Vital Sign   . Days of Exercise per Week: 1 day   . Minutes of Exercise per Session: 60 min  Stress: No Stress Concern Present (08/01/2021)   [employer] of Occupational Health - Occupational Stress Questionnaire   . Feeling of Stress : Not at all  Social Connections: Moderately Isolated (08/01/2021)   Social Connection and Isolation Panel [NHANES]   . Frequency of Communication with Friends and Family: More than three times a week   . Frequency of Social Gatherings with Friends and Family: More than three times a week   . Attends Religious Services: Never   . Active Member of Clubs or Organizations: No   . Attends Banker Meetings: Never   . Marital Status: Married  Catering manager Violence: Not At Risk (08/13/2022)   Humiliation, Afraid, Rape, and Kick questionnaire   . Fear of Current or Ex-Partner: No   . Emotionally Abused: No   . Physically Abused: No   . Sexually Abused: No    Outpatient Medications Prior to Visit  Medication Sig Dispense Refill  . acetaminophen (TYLENOL) 500 MG tablet Take  1,000 mg by mouth daily as needed for mild pain or headache.    . albuterol (VENTOLIN HFA) 108 (90 Base) MCG/ACT inhaler Inhale 2 puffs into the lungs every 6 (six) hours as needed for wheezing or shortness of breath. 1 each 3  . Ascorbic Acid (VITAMIN C) 500 MG CHEW Chew 500 mg by mouth daily.    . carbidopa-levodopa (SINEMET IR) 25-100  MG tablet TAKE 2 TABLETS BY MOUTH AT 7:00 AM, 1 TABLET AT 10:30 AM, 1 TABLET AT 2:00 PM, AND 1 TABLET AT 5:30 PM 450 tablet 0  . cetirizine (ZYRTEC) 10 MG tablet Take 10 mg by mouth daily.    . clonazePAM (KLONOPIN) 0.5 MG tablet Take 1 tablet (0.5 mg total) by mouth at bedtime. 30 tablet 5  . clopidogrel (PLAVIX) 75 MG tablet TAKE 1 TABLET(75 MG) BY MOUTH DAILY 90 tablet 3  . finasteride (PROSCAR) 5 MG tablet Take 5 mg by mouth daily.    . fluticasone (FLONASE) 50 MCG/ACT nasal spray Place 1 spray into both nostrils daily as needed for allergies.    Marland Kitchen gabapentin (NEURONTIN) 300 MG capsule 1 cap po bid and 2 caps po qhs 360 capsule 1  . influenza vaccine adjuvanted (FLUAD QUADRIVALENT) 0.5 ML injection Inject into the muscle. 0.5 mL 0  . Menthol, Topical Analgesic, (BIOFREEZE EX) Apply 1 application topically daily as needed (hand pain).    . Multiple Vitamins-Minerals (MULTIVITAMINS THER. W/MINERALS) TABS tablet Take 1 tablet by mouth daily.    . Naphazoline-Pheniramine (VISINE-A OP) Place 1 drop into both eyes 2 (two) times daily as needed (itchy eyes).    . Omega-3 Fatty Acids (OMEGA-3 PO) Take 1 capsule by mouth daily.    . pantoprazole (PROTONIX) 40 MG tablet Take 1 tablet (40 mg total) by mouth daily. 90 tablet 1  . Polyethyl Glycol-Propyl Glycol (SYSTANE ULTRA OP) Place 1 drop into both eyes 2 (two) times daily as needed (dry eyes).    . Probiotic Product (PROBIOTIC PO) Take 1 capsule by mouth daily.    Marland Kitchen senna-docusate (SENOKOT-S) 8.6-50 MG tablet Take 1 tablet by mouth 2 (two) times daily between meals as needed for moderate constipation.  0  .  simethicone (MYLICON) 125 MG chewable tablet Chew 125 mg by mouth as needed (Bloating).    . simvastatin (ZOCOR) 20 MG tablet TAKE 1 TABLET(20 MG) BY MOUTH DAILY 90 tablet 1  . venlafaxine XR (EFFEXOR-XR) 150 MG 24 hr capsule TAKE 1 CAPSULE BY MOUTH DAILY AT BEDTIME 90 capsule 1  . vitamin B-12 (CYANOCOBALAMIN) 1000 MCG tablet Take 1,000 mcg by mouth daily.    Carlena Hurl 20 MG TABS tablet Take 20 mg by mouth daily.    Marland Kitchen zinc gluconate 50 MG tablet Take 50 mg by mouth daily.     No facility-administered medications prior to visit.    Allergies  Allergen Reactions  . Molds & Smuts Other (See Comments)    Runny nose Itchy eyes   . Pollen Extract Other (See Comments)    Runny nose Itchy  eyes      Review of Systems  Constitutional:  Positive for malaise/fatigue. Negative for fever.  HENT:  Negative for congestion.   Eyes:  Negative for blurred vision.  Respiratory:  Negative for shortness of breath.   Cardiovascular:  Negative for chest pain, palpitations and leg swelling.  Gastrointestinal:  Negative for abdominal pain, blood in stool and nausea.  Genitourinary:  Negative for dysuria and frequency.  Musculoskeletal:  Positive for back pain and joint pain. Negative for falls.  Skin:  Negative for rash.  Neurological:  Positive for tremors and weakness. Negative for dizziness, loss of consciousness and headaches.  Endo/Heme/Allergies:  Negative for environmental allergies.  Psychiatric/Behavioral:  Negative for depression. The patient is not nervous/anxious.       Objective:    Physical Exam  BP 126/74 (BP Location: Left Arm, Patient Position:  Sitting, Cuff Size: Normal)   Pulse 73   Temp 98 F (36.7 C) (Oral)   Resp 16   Ht 5\' 8"  (1.727 m)   Wt 172 lb 12.8 oz (78.4 kg)   SpO2 96%   BMI 26.27 kg/m  Wt Readings from Last 3 Encounters:  06/20/23 172 lb 12.8 oz (78.4 kg)  03/12/23 171 lb 9.6 oz (77.8 kg)  02/05/23 173 lb (78.5 kg)    Diabetic Foot Exam - Simple   No  data filed    Lab Results  Component Value Date   WBC 6.6 03/12/2023   HGB 12.8 (L) 03/12/2023   HCT 39.1 03/12/2023   PLT 180.0 03/12/2023   GLUCOSE 94 03/12/2023   CHOL 146 03/12/2023   TRIG 70.0 03/12/2023   HDL 56.90 03/12/2023   LDLDIRECT 76.0 12/31/2014   LDLCALC 75 03/12/2023   ALT 4 03/12/2023   AST 15 03/12/2023   NA 140 03/12/2023   K 4.3 03/12/2023   CL 104 03/12/2023   CREATININE 1.10 03/12/2023   BUN 18 03/12/2023   CO2 29 03/12/2023   TSH 1.38 03/12/2023   PSA 1.77 08/07/2022   INR 0.98 09/20/2014   HGBA1C 5.5 03/12/2023    Lab Results  Component Value Date   TSH 1.38 03/12/2023   Lab Results  Component Value Date   WBC 6.6 03/12/2023   HGB 12.8 (L) 03/12/2023   HCT 39.1 03/12/2023   MCV 98.5 03/12/2023   PLT 180.0 03/12/2023   Lab Results  Component Value Date   NA 140 03/12/2023   K 4.3 03/12/2023   CO2 29 03/12/2023   GLUCOSE 94 03/12/2023   BUN 18 03/12/2023   CREATININE 1.10 03/12/2023   BILITOT 0.6 03/12/2023   ALKPHOS 66 03/12/2023   AST 15 03/12/2023   ALT 4 03/12/2023   PROT 6.5 03/12/2023   ALBUMIN 4.1 03/12/2023   CALCIUM 9.5 03/12/2023   ANIONGAP 7 07/11/2022   GFR 64.18 03/12/2023   Lab Results  Component Value Date   CHOL 146 03/12/2023   Lab Results  Component Value Date   HDL 56.90 03/12/2023   Lab Results  Component Value Date   LDLCALC 75 03/12/2023   Lab Results  Component Value Date   TRIG 70.0 03/12/2023   Lab Results  Component Value Date   CHOLHDL 3 03/12/2023   Lab Results  Component Value Date   HGBA1C 5.5 03/12/2023       Assessment & Plan:  Hyperglycemia Assessment & Plan: hgba1c acceptable, minimize simple carbs. Increase exercise as tolerated.    Hyperlipidemia, unspecified hyperlipidemia type Assessment & Plan: Encourage heart healthy diet such as MIND or DASH diet, increase exercise, avoid trans fats, simple carbohydrates and processed foods, consider a krill or fish or flaxseed  oil cap daily.     Need for influenza vaccination -     Flu Vaccine Trivalent High Dose (Fluad)    Assessment and Plan    Endocrinology Referral Pending referral to endocrinology at [facility]. No details provided regarding the reason for the referral. -Follow up on the status of the referral to [facility].  General Health Maintenance Last labs were 3 months ago and were satisfactory. No new labs ordered today due to time constraints. -Consider ordering labs at a future date or at a more convenient location ([facility] or [facility]) if necessary.  Geriatric Care Patient considering transition to a geriatrician for primary care due to proximity. -Encourage patient to establish care with a geriatrician  if it is more convenient and meets the patient's needs.         Danise Edge, MD

## 2023-07-05 ENCOUNTER — Telehealth: Payer: Self-pay | Admitting: Neurology

## 2023-07-05 NOTE — Telephone Encounter (Signed)
Called patient and he will be calling PCP regarding this matter

## 2023-07-05 NOTE — Telephone Encounter (Signed)
Patient was wondering if there was any recommendations to the neck pain or if it was from parkinson's and if there was something he could take to help. Achy,stiff neck pain

## 2023-07-15 ENCOUNTER — Other Ambulatory Visit: Payer: Self-pay | Admitting: Neurology

## 2023-07-15 ENCOUNTER — Telehealth: Payer: Self-pay | Admitting: Family Medicine

## 2023-07-15 NOTE — Telephone Encounter (Signed)
Patient called and is needing a med refill on  clopidogrel (PLAVIX) 75 MG tablet   Patient states almost out of medication.  Please send to [facility] [address] in [place]

## 2023-07-15 NOTE — Telephone Encounter (Signed)
Chart review shows Rx has been prescribed by neurology.  Notified pt and he states he will contact his current neurologist as he is no longer seeing Dr Pearlean Brownie.

## 2023-07-16 DIAGNOSIS — H524 Presbyopia: Secondary | ICD-10-CM | POA: Diagnosis not present

## 2023-07-16 DIAGNOSIS — Z23 Encounter for immunization: Secondary | ICD-10-CM | POA: Diagnosis not present

## 2023-08-01 DIAGNOSIS — L814 Other melanin hyperpigmentation: Secondary | ICD-10-CM | POA: Diagnosis not present

## 2023-08-01 DIAGNOSIS — D2239 Melanocytic nevi of other parts of face: Secondary | ICD-10-CM | POA: Diagnosis not present

## 2023-08-01 DIAGNOSIS — B078 Other viral warts: Secondary | ICD-10-CM | POA: Diagnosis not present

## 2023-08-01 DIAGNOSIS — I789 Disease of capillaries, unspecified: Secondary | ICD-10-CM | POA: Diagnosis not present

## 2023-08-01 DIAGNOSIS — D485 Neoplasm of uncertain behavior of skin: Secondary | ICD-10-CM | POA: Diagnosis not present

## 2023-08-09 DIAGNOSIS — Z719 Counseling, unspecified: Secondary | ICD-10-CM | POA: Diagnosis not present

## 2023-08-09 DIAGNOSIS — N400 Enlarged prostate without lower urinary tract symptoms: Secondary | ICD-10-CM | POA: Diagnosis not present

## 2023-08-09 DIAGNOSIS — R7989 Other specified abnormal findings of blood chemistry: Secondary | ICD-10-CM | POA: Diagnosis not present

## 2023-08-09 DIAGNOSIS — Z7189 Other specified counseling: Secondary | ICD-10-CM | POA: Diagnosis not present

## 2023-08-09 NOTE — Progress Notes (Unsigned)
Assessment/Plan:   1.  Parkinsons Disease  -Continue carbidopa/levodopa 25/100, 2 at 7am, 1 at 10:30, 1 at 2 pm, 1 at 5:30 pm   2.  RBD  -Continue clonazepam 0.5 mg at bedtime   3.  New onset paroxysmal A-fib, February 2024  -Following with cardiology at [facility] and now on Xarelto  4.  Patient has moved to [facility] and now has an upcoming appointment with Dr. Seward Meth in May, 2025.  I told him that he would love her!  Subjective:   Paul Drake was seen today in follow up for Parkinsons disease.  My previous records were reviewed prior to todays visit as well as outside records available to me.  Patient doing fairly well from a Parkinson's standpoint.  Last visit, the patient [place] and we referred him to Dr. Seward Meth at [facility].  He does have an appointment, but not until May.  He has not had falls.  He saw his primary care physician in October and notes are reviewed.  He did call me in October to ask if neck pain was from Parkinson's, and we told him it was not  Current prescribed movement disorder medications: Carbidopa/levodopa 25/100, 2 at 7am, 1 at 10:30, 1 at 2 pm, 1 at 5:30 pm (increased last visit) Clonazepam 0.5 mg at bedtime (PDMP reviewed.  Last filled May 27, 2023   ALLERGIES:   Allergies  Allergen Reactions   Molds & Smuts Other (See Comments)    Runny nose Itchy eyes    Pollen Extract Other (See Comments)    Runny nose Itchy  eyes      CURRENT MEDICATIONS:  Outpatient Encounter Medications as of 08/13/2023  Medication Sig   acetaminophen (TYLENOL) 500 MG tablet Take 1,000 mg by mouth daily as needed for mild pain or headache.   albuterol (VENTOLIN HFA) 108 (90 Base) MCG/ACT inhaler Inhale 2 puffs into the lungs every 6 (six) hours as needed for wheezing or shortness of breath.   Ascorbic Acid (VITAMIN C) 500 MG CHEW Chew 500 mg by mouth daily.   carbidopa-levodopa (SINEMET IR) 25-100 MG tablet TAKE 2 TABLETS  BY MOUTH AT 7:00 AM, 1 TABLET AT 10:30 AM, 1 TABLET AT 2:00 PM, AND 1 TABLET AT 5:30 PM   cetirizine (ZYRTEC) 10 MG tablet Take 10 mg by mouth daily.   clonazePAM (KLONOPIN) 0.5 MG tablet Take 1 tablet (0.5 mg total) by mouth at bedtime.   clopidogrel (PLAVIX) 75 MG tablet TAKE 1 TABLET(75 MG) BY MOUTH DAILY   finasteride (PROSCAR) 5 MG tablet Take 5 mg by mouth daily.   fluticasone (FLONASE) 50 MCG/ACT nasal spray Place 1 spray into both nostrils daily as needed for allergies.   gabapentin (NEURONTIN) 300 MG capsule 1 cap po bid and 2 caps po qhs   influenza vaccine adjuvanted (FLUAD QUADRIVALENT) 0.5 ML injection Inject into the muscle.   Menthol, Topical Analgesic, (BIOFREEZE EX) Apply 1 application topically daily as needed (hand pain).   Multiple Vitamins-Minerals (MULTIVITAMINS THER. W/MINERALS) TABS tablet Take 1 tablet by mouth daily.   Naphazoline-Pheniramine (VISINE-A OP) Place 1 drop into both eyes 2 (two) times daily as needed (itchy eyes).   Omega-3 Fatty Acids (OMEGA-3 PO) Take 1 capsule by mouth daily.   pantoprazole (PROTONIX) 40 MG tablet Take 1 tablet (40 mg total) by mouth daily.   Polyethyl Glycol-Propyl Glycol (SYSTANE ULTRA OP) Place 1 drop into both eyes 2 (two) times daily  as needed (dry eyes).   Probiotic Product (PROBIOTIC PO) Take 1 capsule by mouth daily.   senna-docusate (SENOKOT-S) 8.6-50 MG tablet Take 1 tablet by mouth 2 (two) times daily between meals as needed for moderate constipation.   simethicone (MYLICON) 125 MG chewable tablet Chew 125 mg by mouth as needed (Bloating).   simvastatin (ZOCOR) 20 MG tablet TAKE 1 TABLET(20 MG) BY MOUTH DAILY   venlafaxine XR (EFFEXOR-XR) 150 MG 24 hr capsule TAKE 1 CAPSULE BY MOUTH DAILY AT BEDTIME   vitamin B-12 (CYANOCOBALAMIN) 1000 MCG tablet Take 1,000 mcg by mouth daily.   XARELTO 20 MG TABS tablet Take 20 mg by mouth daily.   zinc gluconate 50 MG tablet Take 50 mg by mouth daily.   No facility-administered encounter  medications on file as of 08/13/2023.    Objective:   PHYSICAL EXAMINATION:    VITALS:   There were no vitals filed for this visit.   GEN:  The patient appears stated age and is in NAD. HEENT:  Normocephalic, atraumatic.  The mucous membranes are moist. The superficial temporal arteries are without ropiness or tenderness. CV:  RRR Lungs:  CTAB Neck/HEME:  There are no carotid bruits bilaterally.  Neurological examination:  Orientation: The patient is alert and oriented x3. Cranial nerves: There is good facial symmetry with minimal facial hypomimia. The speech is fluent and clear. Soft palate rises symmetrically and there is no tongue deviation. Hearing is intact to conversational tone. Sensation: Sensation is intact to light touch throughout Motor: Strength is at least antigravity x4.  Movement examination: Tone: There is nl tone today in the UE/LE Abnormal movements: none today Coordination:  There is no decremation with any form of RAMS, including alternating supination and pronation of the forearm, hand opening and closing, finger taps, heel taps and toe taps.  Gait and Station: The patient has no difficulty arising out of a deep-seated chair without the use of the hands. The patient's stride length is good  I have reviewed and interpreted the following labs independently    Chemistry      Component Value Date/Time   NA 140 03/12/2023 1114   K 4.3 03/12/2023 1114   CL 104 03/12/2023 1114   CO2 29 03/12/2023 1114   BUN 18 03/12/2023 1114   CREATININE 1.10 03/12/2023 1114   CREATININE 1.38 (H) 07/04/2020 1004      Component Value Date/Time   CALCIUM 9.5 03/12/2023 1114   ALKPHOS 66 03/12/2023 1114   AST 15 03/12/2023 1114   ALT 4 03/12/2023 1114   BILITOT 0.6 03/12/2023 1114       Lab Results  Component Value Date   WBC 6.6 03/12/2023   HGB 12.8 (L) 03/12/2023   HCT 39.1 03/12/2023   MCV 98.5 03/12/2023   PLT 180.0 03/12/2023    Lab Results  Component  Value Date   TSH 1.38 03/12/2023   Total time spent on today's visit was *** minutes, including both face-to-face time and nonface-to-face time.  Time included that spent on review of records (prior notes available to me/labs/imaging if pertinent), discussing treatment and goals, answering patient's questions and coordinating care.    Cc:  Bradd Canary, MD

## 2023-08-12 DIAGNOSIS — B079 Viral wart, unspecified: Secondary | ICD-10-CM | POA: Diagnosis not present

## 2023-08-13 ENCOUNTER — Encounter: Payer: Self-pay | Admitting: Neurology

## 2023-08-13 ENCOUNTER — Ambulatory Visit (INDEPENDENT_AMBULATORY_CARE_PROVIDER_SITE_OTHER): Payer: Medicare Other | Admitting: Neurology

## 2023-08-13 VITALS — BP 130/90 | HR 82 | Ht 67.0 in | Wt 180.0 lb

## 2023-08-13 DIAGNOSIS — G4709 Other insomnia: Secondary | ICD-10-CM | POA: Diagnosis not present

## 2023-08-13 DIAGNOSIS — G20A1 Parkinson's disease without dyskinesia, without mention of fluctuations: Secondary | ICD-10-CM

## 2023-08-13 NOTE — Patient Instructions (Signed)
Add melatonin 3 mg at bed  Here are some resources/books that you may find helpful as you navigate the challenges of Parkinson's Disease  1.  Parkinson's treatement: 10 secrets to a happier life by Jefferey Pica, MD 2.  Navigating Life with Parkinsons disease by Sotirios Parashos 3.  My degeneration: A journey through Parkinsons Ledora Bottcher - Shohl) 4.  Every Victory counts (I believe this one if free through [employer]) 5.  Lucky Man by Gardner Candle 6.  101 Questions & Answers about Parkinson's by Caprice Renshaw 7.  Parkinsons Disease Treatment Book by JE Ahiskog

## 2023-08-27 ENCOUNTER — Ambulatory Visit (INDEPENDENT_AMBULATORY_CARE_PROVIDER_SITE_OTHER): Payer: Medicare Other

## 2023-08-27 VITALS — Ht 67.0 in | Wt 180.0 lb

## 2023-08-27 DIAGNOSIS — Z0000 Encounter for general adult medical examination without abnormal findings: Secondary | ICD-10-CM

## 2023-08-27 NOTE — Patient Instructions (Addendum)
Paul Drake , Thank you for taking time to come for your Medicare Wellness Visit. I appreciate your ongoing commitment to your health goals. Please review the following plan we discussed and let me know if I can assist you in the future.   Referrals/Orders/Follow-Ups/Clinician Recommendations:   This is a list of the screening recommended for you and due dates:  Health Maintenance  Topic Date Due   Pneumonia Vaccine (2 of 2 - PPSV23 or PCV20) 12/10/2016   COVID-19 Vaccine (8 - 2023-24 season) 05/19/2023   Medicare Annual Wellness Visit  08/26/2024   DTaP/Tdap/Td vaccine (3 - Td or Tdap) 11/27/2031   Flu Shot  Completed   Hepatitis C Screening  Completed   Zoster (Shingles) Vaccine  Completed   HPV Vaccine  Aged Out   Colon Cancer Screening  Discontinued    Advanced directives: (In Chart) A copy of your advanced directives are scanned into your chart should your provider ever need it.  Next Medicare Annual Wellness Visit scheduled for next year: Yes

## 2023-08-27 NOTE — Progress Notes (Signed)
Subjective:   Paul Drake is a 77 y.o. male who presents for Medicare Annual/Subsequent preventive examination.  Visit Complete: Virtual I connected with  Paul Drake on 08/27/23 by a audio enabled telemedicine application and verified that I am speaking with the correct person using two identifiers.  Patient Location: Home  Provider Location: Home Office  I discussed the limitations of evaluation and management by telemedicine. The patient expressed understanding and agreed to proceed.  Vital Signs: Because this visit was a virtual/telehealth visit, some criteria may be missing or patient reported. Any vitals not documented were not able to be obtained and vitals that have been documented are patient reported.  Patient Medicare AWV questionnaire was completed by the patient on 08/26/23; I have confirmed that all information answered by patient is correct and no changes since this date.  Cardiac Risk Factors include: advanced age (>21men, >57 women);hypertension;male gender     Objective:    Today's Vitals   08/27/23 1318  Weight: 180 lb (81.6 kg)  Height: 5\' 7"  (1.702 m)   Body mass index is 28.19 kg/m.     08/27/2023    1:26 PM 08/13/2023    8:17 AM 02/05/2023    9:18 AM 08/13/2022    9:02 AM 07/19/2022   10:32 AM 07/11/2022   10:52 AM 03/26/2022    3:36 PM  Advanced Directives  Does Patient Have a Medical Advance Directive? Yes Yes Yes Yes Yes Yes Yes  Type of Estate agent of [place];Living will Living will Living will Healthcare Power of [place];Living will Living will Healthcare Power of [place];Living will Healthcare Power of [place];Living will  Does patient want to make changes to medical advance directive? No - Patient declined   No - Patient declined   No - Patient declined  Copy of Healthcare Power of Attorney in Chart? Yes - validated most recent copy scanned in chart (See row information)   No - copy requested  No - copy requested      Current Medications (verified) Outpatient Encounter Medications as of 08/27/2023  Medication Sig   acetaminophen (TYLENOL) 500 MG tablet Take 1,000 mg by mouth daily as needed for mild pain or headache.   albuterol (VENTOLIN HFA) 108 (90 Base) MCG/ACT inhaler Inhale 2 puffs into the lungs every 6 (six) hours as needed for wheezing or shortness of breath.   Ascorbic Acid (VITAMIN C) 500 MG CHEW Chew 500 mg by mouth daily.   carbidopa-levodopa (SINEMET IR) 25-100 MG tablet TAKE 2 TABLETS BY MOUTH AT 7:00 AM, 1 TABLET AT 10:30 AM, 1 TABLET AT 2:00 PM, AND 1 TABLET AT 5:30 PM   cetirizine (ZYRTEC) 10 MG tablet Take 10 mg by mouth daily.   clonazePAM (KLONOPIN) 0.5 MG tablet Take 1 tablet (0.5 mg total) by mouth at bedtime.   finasteride (PROSCAR) 5 MG tablet Take 5 mg by mouth daily.   fluticasone (FLONASE) 50 MCG/ACT nasal spray Place 1 spray into both nostrils daily as needed for allergies.   gabapentin (NEURONTIN) 300 MG capsule 1 cap po bid and 2 caps po qhs   Menthol, Topical Analgesic, (BIOFREEZE EX) Apply 1 application topically daily as needed (hand pain).   Multiple Vitamins-Minerals (MULTIVITAMINS THER. W/MINERALS) TABS tablet Take 1 tablet by mouth daily.   Naphazoline-Pheniramine (VISINE-A OP) Place 1 drop into both eyes 2 (two) times daily as needed (itchy eyes).   Omega-3 Fatty Acids (OMEGA-3 PO) Take 1 capsule by mouth daily.   pantoprazole (PROTONIX) 40 MG tablet  Take 1 tablet (40 mg total) by mouth daily.   Polyethyl Glycol-Propyl Glycol (SYSTANE ULTRA OP) Place 1 drop into both eyes 2 (two) times daily as needed (dry eyes).   Probiotic Product (PROBIOTIC PO) Take 1 capsule by mouth daily.   senna-docusate (SENOKOT-S) 8.6-50 MG tablet Take 1 tablet by mouth 2 (two) times daily between meals as needed for moderate constipation.   simethicone (MYLICON) 125 MG chewable tablet Chew 125 mg by mouth as needed (Bloating).   simvastatin (ZOCOR) 20 MG tablet TAKE 1 TABLET(20 MG) BY  MOUTH DAILY   venlafaxine XR (EFFEXOR-XR) 150 MG 24 hr capsule TAKE 1 CAPSULE BY MOUTH DAILY AT BEDTIME   vitamin B-12 (CYANOCOBALAMIN) 1000 MCG tablet Take 1,000 mcg by mouth daily.   XARELTO 20 MG TABS tablet Take 20 mg by mouth daily.   zinc gluconate 50 MG tablet Take 50 mg by mouth daily.   No facility-administered encounter medications on file as of 08/27/2023.    Allergies (verified) Molds & smuts and Pollen extract   History: Past Medical History:  Diagnosis Date   Allergic state 12/29/2013   Anxiety    Arthritis    Barrett's esophagus    BPH (benign prostatic hyperplasia)    Chicken pox    CVA (cerebral infarction)    Depression    GERD (gastroesophageal reflux disease)    History of kidney stones    HTN (hypertension), benign 04/01/2015   Hyperglycemia 01/06/2015   Hyperlipemia    Hypertension    IBS (irritable bowel syndrome)    Left hand pain 07/21/2017   Measles as a child   Medicare annual wellness visit, subsequent 06/26/2015   Mumps as a child   Otitis externa 12/10/2013   Parkinson disease (HCC)    PFO (patent foramen ovale)    Seasonal allergies    some asthma   Skin cancer of face    Stroke [facility])    Unspecified asthma(493.90) 12/29/2013   Past Surgical History:  Procedure Laterality Date   COLONOSCOPY     EP IMPLANTABLE DEVICE N/A 10/16/2016   Procedure: Loop Recorder Insertion;  Surgeon: Hillis Range, MD;  Location: [facility] IN[facility]ASIVE [facility];  Service: Cardiovascular;  Laterality: N/A;   GANGLION CYST EXCISION Left 06/08/2021   Procedure: LEFT VOLAR GANGLION EXCISION;  Surgeon: Marlyne Beards, MD;  Location:  SURGERY CENTER;  Service: Orthopedics;  Laterality: Left;   implantable loop recorder removal  09/21/2020   Reveal LINQ removed in office   LITHOTRIPSY     multiple times   PROSTATE SURGERY  08/2017   TEE WITHOUT CARDIOVERSION N/A 09/22/2014   Procedure: TRANSESOPHAGEAL ECHOCARDIOGRAM (TEE);  Surgeon: Wendall Stade, MD;  Location: [facility]  ENDOSCOPY;  Service: Cardiovascular;  Laterality: N/A;   WISDOM TOOTH EXTRACTION     Family History  Problem Relation Age of Onset   Hypertension Mother    Hyperlipidemia Mother    Fibromyalgia Mother    Arthritis Mother        rheumatoid   Ulcers Father 15       Bleeding Ulcers   Heart disease Sister        heart failure   Diabetes Sister        type 2   Hyperlipidemia Brother    Hypertension Brother    Prostate cancer Brother    Kidney Stones Daughter    Anorexia nervosa Daughter    Asthma Daughter    [place] Daughter    Healthy Son    Stroke  Maternal Aunt    Cancer Maternal Uncle        prostate   Cancer Maternal Grandfather        skin ?   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 They are living in their daughter's house to help to help take care of their granddaughter.      Patient uses a home gym 3-4 x a week.      Social Determinants of Health   Financial Resource Strain: Low Risk  (08/26/2023)   Overall Financial Resource Strain (CARDIA)    Difficulty of Paying Living Expenses: Not very hard  Food Insecurity: No Food Insecurity (08/26/2023)   Hunger Vital Sign    Worried About Running Out  of Food in the Last Year: Never true    Ran Out of Food in the Last Year: Never true  Transportation Needs: No Transportation Needs (08/26/2023)   PRAPARE - Administrator, Civil Service (Medical): No    Lack of Transportation (Non-Medical): No  Physical Activity: Insufficiently Active (08/26/2023)   Exercise Vital Sign    Days of Exercise per Week: 3 days    Minutes of Exercise per Session: 40 min  Stress: Stress Concern Present (08/26/2023)   [employer] of Occupational Health - Occupational Stress Questionnaire    Feeling of Stress : To some extent  Social Connections: Unknown (08/26/2023)   Social Connection and Isolation Panel [NHANES]    Frequency of Communication with Friends and Family: Three times a week    Frequency of Social Gatherings with Friends and Family: Once a week    Attends Religious Services: Not on Marketing executive or Organizations: No    Attends Banker Meetings: Never    Marital Status: Married    Tobacco Counseling Counseling given: Not Answered   Clinical Intake:  Pre-visit preparation completed: Yes  Pain : No/denies pain     BMI - recorded: 28.19 Nutritional Status: BMI 25 -29 Overweight Nutritional Risks: None Diabetes: No  How often do you need to have someone help you when you read instructions, pamphlets, or other written materials from your doctor or pharmacy?: 2 - Rarely  Interpreter Needed?: No  Information entered by :: Theresa Mulligan LPN   Activities of Daily Living    08/26/2023    4:40 PM  In your present state of health, do you have any difficulty performing the following activities:  Hearing? 0  Vision? 0  Difficulty concentrating or making decisions? 0  Walking or climbing stairs? 0  Dressing or bathing? 0  Doing errands, shopping? 0  Preparing Food and eating ? N  Using the Toilet? N  In the past six months, have you accidently leaked urine? N  Do you have problems with loss  of bowel control? N  Managing your Medications? N  Managing your Finances? N  Housekeeping or managing your Housekeeping? N    Patient Care Team: Bradd Canary, MD as PCP - General (Family Medicine) Marvel Plan, MD as Consulting Physician (Neurology) Meryl Dare, MD as Consulting Physician (Gastroenterology) Malva Cogan, MD as Referring Physician (Dermatology) Noel Christmas, MD as Consulting Physician (Urology) Tat, Octaviano Batty, DO as Consulting Physician (Neurology)  Indicate any recent Medical Services you may have received from other than [facility] providers in the past year (date may be approximate).     Assessment:   This is a routine wellness examination for [place].  Hearing/Vision screen Hearing Screening - Comments:: Denies hearing difficulties   Vision Screening - Comments:: Wears rx glasses - up to date with routine eye exams with  Dr Cheryll Cockayne   Goals Addressed               This Visit's Progress     Patient Stated (pt-stated)        Increase exercise.       Depression Screen    08/27/2023    1:33 PM 06/20/2023   12:06 PM 03/12/2023   10:02 AM 11/12/2022   10:18 AM 10/16/2022    5:53 PM 08/13/2022    9:08 AM 08/07/2022   10:53 AM  PHQ 2/9 Scores  PHQ - 2 Score 0 0 0 2 0 0 0  PHQ- 9 Score   0 6 0      Fall Risk    08/27/2023    1:24 PM 08/26/2023    4:40 PM 08/13/2023    8:17 AM 06/20/2023   12:05 PM 06/04/2023   11:46 AM  Fall Risk   Falls in the past year? 1 1 1  0 1  Number falls in past yr: 0 1 0  0  Injury with Fall? 1 0 0 0 1  Comment Contusion to left  [place]. Followed by medical attention      Risk for fall due to : No Fall Risks      Follow up Falls prevention discussed  Falls evaluation completed Falls evaluation completed Falls evaluation completed    MEDICARE RISK AT HOME: Medicare Risk at Home Any stairs in or around the home?: No If so, are there any without handrails?: Yes Home free of loose throw rugs in walkways, pet  beds, electrical cords, etc?: Yes Adequate lighting in your home to reduce risk of falls?: Yes Life alert?: No Grab bars in the bathroom?: No Shower chair or bench in shower?: Yes Elevated toilet seat or a handicapped toilet?: Yes  TIMED UP AND GO:  Was the test performed?  No    Cognitive Function:    10/15/2016    1:37 PM  MMSE - Mini Mental State Exam  Orientation to time 5  Orientation to Place 5  Registration 3  Attention/ Calculation 5  Recall 3  Language- name 2 objects 2  Language- repeat 1  Language- follow 3 step command 3  Language- read & follow direction 1  Write a sentence 1  Copy design 1  Total score 30        08/27/2023    1:35 PM 08/13/2022  9:14 AM  6CIT Screen  What Year? 0 points 0 points  What month? 0 points 0 points  What time? 0 points 0 points  Count back from 20 0 points 0 points  Months in reverse 0 points 0 points  Repeat phrase 0 points 2 points  Total Score 0 points 2 points    Immunizations Immunization History  Administered Date(s) Administered   Fluad Quad(high Dose 65+) 06/17/2019, 06/23/2020, 06/08/2022   Fluad Trivalent(High Dose 65+) 06/20/2023   Influenza, High Dose Seasonal PF 06/23/2017, 07/01/2018   Influenza,inj,Quad PF,6+ Mos 05/17/2014, 05/20/2015, 07/05/2016   Influenza-Unspecified 06/17/2013, 06/05/2021, 06/17/2022   PFIZER Comirnaty(Gray Top)Covid-19 Tri-Sucrose Vaccine 01/24/2021   PFIZER(Purple Top)SARS-COV-2 Vaccination 10/02/2019, 10/23/2019, 05/26/2020   Pfizer Covid-19 Vaccine Bivalent Booster 46yrs & up 06/05/2021, 02/06/2022   Pfizer(Comirnaty)Fall Seasonal Vaccine 12 years and older 07/09/2022   Pneumococcal Conjugate-13 10/15/2016   Pneumococcal-Unspecified 06/17/2013   Respiratory Syncytial Virus Vaccine,Recomb Aduvanted(Arexvy) 07/09/2022   Tdap 11/11/2013, 11/26/2021   Zoster Recombinant(Shingrix) 06/19/2021, 09/13/2021    TDAP status: Up to date  Flu Vaccine status: Up to  date  Pneumococcal vaccine status: Due, Education has been provided regarding the importance of this vaccine. Advised may receive this vaccine at local pharmacy or Health Dept. Aware to provide a copy of the vaccination record if obtained from local pharmacy or Health Dept. Verbalized acceptance and understanding.  Covid-19 vaccine status: Declined, Education has been provided regarding the importance of this vaccine but patient still declined. Advised may receive this vaccine at local pharmacy or Health Dept.or vaccine clinic. Aware to provide a copy of the vaccination record if obtained from local pharmacy or Health Dept. Verbalized acceptance and understanding.  Qualifies for Shingles Vaccine? Yes   Zostavax completed Yes   Shingrix Completed?: Yes  Screening Tests Health Maintenance  Topic Date Due   Pneumonia Vaccine 82+ Years old (2 of 2 - PPSV23 or PCV20) 12/10/2016   COVID-19 Vaccine (8 - 2023-24 season) 05/19/2023   Medicare Annual Wellness (AWV)  08/26/2024   DTaP/Tdap/Td (3 - Td or Tdap) 11/27/2031   INFLUENZA VACCINE  Completed   Hepatitis C Screening  Completed   Zoster Vaccines- Shingrix  Completed   HPV VACCINES  Aged Out   Colonoscopy  Discontinued    Health Maintenance  Health Maintenance Due  Topic Date Due   Pneumonia Vaccine 30+ Years old (2 of 2 - PPSV23 or PCV20) 12/10/2016   COVID-19 Vaccine (8 - 2023-24 season) 05/19/2023         Additional Screening:  Hepatitis C Screening: does qualify; Completed 10/20/15  Vision Screening: Recommended annual ophthalmology exams for early detection of glaucoma and other disorders of the eye. Is the patient up to date with their annual eye exam?  Yes  Who is the provider or what is the name of the office in which the patient attends annual eye exams? Dr Cheryll Cockayne If pt is not established with a provider, would they like to be referred to a provider to establish care? No .   Dental Screening: Recommended annual  dental exams for proper oral hygiene   Community Resource Referral / Chronic Care Management:  CRR required this visit?  No   CCM required this visit?  No     Plan:     I have personally reviewed and noted the following in the patient's chart:   Medical and social history Use of alcohol, tobacco or illicit drugs  Current medications and supplements including opioid prescriptions. Patient is not  currently taking opioid prescriptions. Functional ability and status Nutritional status Physical activity Advanced directives List of other physicians Hospitalizations, surgeries, and ER visits in previous 12 months Vitals Screenings to include cognitive, depression, and falls Referrals and appointments  In addition, I have reviewed and discussed with patient certain preventive protocols, quality metrics, and best practice recommendations. A written personalized care plan for preventive services as well as general preventive health recommendations were provided to patient.     Tillie Rung, LPN   52/84/1324   After Visit Summary: (MyChart) Due to this being a telephonic visit, the after visit summary with patients personalized plan was offered to patient via MyChart   Nurse Notes: None

## 2023-09-02 ENCOUNTER — Other Ambulatory Visit: Payer: Self-pay | Admitting: Neurology

## 2023-09-02 ENCOUNTER — Other Ambulatory Visit: Payer: Self-pay | Admitting: Family Medicine

## 2023-09-02 ENCOUNTER — Telehealth: Payer: Self-pay | Admitting: Family Medicine

## 2023-09-02 DIAGNOSIS — G20A1 Parkinson's disease without dyskinesia, without mention of fluctuations: Secondary | ICD-10-CM

## 2023-09-02 MED ORDER — FLUTICASONE PROPIONATE 50 MCG/ACT NA SUSP: 1.0000 | Freq: Every day | NASAL | 3 refills | Status: AC | PRN

## 2023-09-02 MED ORDER — FLUTICASONE PROPIONATE 50 MCG/ACT NA SUSP: 1.0000 | Freq: Every day | NASAL | 3 refills | Status: DC | PRN

## 2023-09-02 NOTE — Telephone Encounter (Signed)
I didn't see a refill selection on the medication list  Please advise

## 2023-09-02 NOTE — Telephone Encounter (Signed)
Prescription Request  09/02/2023  Is this a "Controlled Substance" medicine? No  LOV: 06/20/2023  What is the name of the medication or equipment?   fluticasone (FLONASE) 50 MCG/ACT nasal spray [[id number]]  Have you contacted your pharmacy to request a refill? No   Which pharmacy would you like this sent to?   [facility] [phone] - [address] [place], [place] - [address] AT [facility] [address] [facility] [address] [place] [address] Phone: [phone] Fax: [phone]    Patient notified that their request is being sent to the clinical staff for review and that they should receive a response within 2 business days.   Please advise at Mobile [phone] (mobile)

## 2023-09-02 NOTE — Addendum Note (Signed)
Addended by: Thelma Barge D on: 09/02/2023 02:18 PM   Modules accepted: Orders

## 2023-09-09 DIAGNOSIS — R7989 Other specified abnormal findings of blood chemistry: Secondary | ICD-10-CM | POA: Diagnosis not present

## 2023-09-20 ENCOUNTER — Other Ambulatory Visit: Payer: Self-pay | Admitting: Neurology

## 2023-09-20 DIAGNOSIS — N400 Enlarged prostate without lower urinary tract symptoms: Secondary | ICD-10-CM | POA: Diagnosis not present

## 2023-09-20 DIAGNOSIS — Z719 Counseling, unspecified: Secondary | ICD-10-CM | POA: Diagnosis not present

## 2023-09-20 DIAGNOSIS — G20A1 Parkinson's disease without dyskinesia, without mention of fluctuations: Secondary | ICD-10-CM

## 2023-09-20 DIAGNOSIS — R7989 Other specified abnormal findings of blood chemistry: Secondary | ICD-10-CM | POA: Diagnosis not present

## 2023-09-20 DIAGNOSIS — Z7189 Other specified counseling: Secondary | ICD-10-CM | POA: Diagnosis not present

## 2023-09-23 ENCOUNTER — Other Ambulatory Visit: Payer: Self-pay | Admitting: Family Medicine

## 2023-09-26 DIAGNOSIS — Z23 Encounter for immunization: Secondary | ICD-10-CM | POA: Diagnosis not present

## 2023-09-26 DIAGNOSIS — N400 Enlarged prostate without lower urinary tract symptoms: Secondary | ICD-10-CM | POA: Diagnosis not present

## 2023-09-26 DIAGNOSIS — M792 Neuralgia and neuritis, unspecified: Secondary | ICD-10-CM | POA: Diagnosis not present

## 2023-09-26 DIAGNOSIS — G20A1 Parkinson's disease without dyskinesia, without mention of fluctuations: Secondary | ICD-10-CM | POA: Diagnosis not present

## 2023-09-26 DIAGNOSIS — F32A Depression, unspecified: Secondary | ICD-10-CM | POA: Diagnosis not present

## 2023-09-26 DIAGNOSIS — Z1331 Encounter for screening for depression: Secondary | ICD-10-CM | POA: Diagnosis not present

## 2023-09-26 DIAGNOSIS — N529 Male erectile dysfunction, unspecified: Secondary | ICD-10-CM | POA: Diagnosis not present

## 2023-09-26 DIAGNOSIS — R030 Elevated blood-pressure reading, without diagnosis of hypertension: Secondary | ICD-10-CM | POA: Diagnosis not present

## 2023-09-26 DIAGNOSIS — M67432 Ganglion, left wrist: Secondary | ICD-10-CM | POA: Diagnosis not present

## 2023-09-26 DIAGNOSIS — I480 Paroxysmal atrial fibrillation: Secondary | ICD-10-CM | POA: Diagnosis not present

## 2023-10-01 DIAGNOSIS — N529 Male erectile dysfunction, unspecified: Secondary | ICD-10-CM | POA: Diagnosis not present

## 2023-10-24 ENCOUNTER — Other Ambulatory Visit: Payer: Self-pay | Admitting: Family Medicine

## 2023-11-14 DIAGNOSIS — N200 Calculus of kidney: Secondary | ICD-10-CM | POA: Diagnosis not present

## 2023-11-21 DIAGNOSIS — N200 Calculus of kidney: Secondary | ICD-10-CM | POA: Diagnosis not present

## 2023-11-29 ENCOUNTER — Other Ambulatory Visit: Payer: Self-pay | Admitting: Family Medicine

## 2023-12-09 DIAGNOSIS — N489 Disorder of penis, unspecified: Secondary | ICD-10-CM | POA: Diagnosis not present

## 2023-12-13 DIAGNOSIS — I4892 Unspecified atrial flutter: Secondary | ICD-10-CM | POA: Diagnosis not present

## 2023-12-16 DIAGNOSIS — I4892 Unspecified atrial flutter: Secondary | ICD-10-CM | POA: Diagnosis not present

## 2023-12-25 DIAGNOSIS — R200 Anesthesia of skin: Secondary | ICD-10-CM | POA: Diagnosis not present

## 2023-12-25 DIAGNOSIS — R030 Elevated blood-pressure reading, without diagnosis of hypertension: Secondary | ICD-10-CM | POA: Diagnosis not present

## 2023-12-27 ENCOUNTER — Other Ambulatory Visit: Payer: Self-pay | Admitting: Neurology

## 2023-12-27 DIAGNOSIS — G20A1 Parkinson's disease without dyskinesia, without mention of fluctuations: Secondary | ICD-10-CM

## 2024-01-14 DIAGNOSIS — R202 Paresthesia of skin: Secondary | ICD-10-CM | POA: Diagnosis not present

## 2024-01-16 DIAGNOSIS — I4892 Unspecified atrial flutter: Secondary | ICD-10-CM | POA: Diagnosis not present

## 2024-01-17 DIAGNOSIS — M5412 Radiculopathy, cervical region: Secondary | ICD-10-CM | POA: Diagnosis not present

## 2024-01-17 DIAGNOSIS — R202 Paresthesia of skin: Secondary | ICD-10-CM | POA: Diagnosis not present

## 2024-01-17 DIAGNOSIS — G5602 Carpal tunnel syndrome, left upper limb: Secondary | ICD-10-CM | POA: Diagnosis not present

## 2024-01-21 DIAGNOSIS — Z79899 Other long term (current) drug therapy: Secondary | ICD-10-CM | POA: Diagnosis not present

## 2024-01-21 DIAGNOSIS — N401 Enlarged prostate with lower urinary tract symptoms: Secondary | ICD-10-CM | POA: Diagnosis not present

## 2024-01-21 DIAGNOSIS — N529 Male erectile dysfunction, unspecified: Secondary | ICD-10-CM | POA: Diagnosis not present

## 2024-01-21 DIAGNOSIS — N200 Calculus of kidney: Secondary | ICD-10-CM | POA: Diagnosis not present

## 2024-01-21 DIAGNOSIS — G20A1 Parkinson's disease without dyskinesia, without mention of fluctuations: Secondary | ICD-10-CM | POA: Diagnosis not present

## 2024-01-24 DIAGNOSIS — G5602 Carpal tunnel syndrome, left upper limb: Secondary | ICD-10-CM | POA: Diagnosis not present

## 2024-01-29 DIAGNOSIS — M5412 Radiculopathy, cervical region: Secondary | ICD-10-CM | POA: Diagnosis not present

## 2024-01-29 DIAGNOSIS — G5602 Carpal tunnel syndrome, left upper limb: Secondary | ICD-10-CM | POA: Diagnosis not present

## 2024-02-13 DIAGNOSIS — G20A1 Parkinson's disease without dyskinesia, without mention of fluctuations: Secondary | ICD-10-CM | POA: Diagnosis not present

## 2024-02-18 DIAGNOSIS — M4802 Spinal stenosis, cervical region: Secondary | ICD-10-CM | POA: Diagnosis not present

## 2024-02-18 DIAGNOSIS — M47812 Spondylosis without myelopathy or radiculopathy, cervical region: Secondary | ICD-10-CM | POA: Diagnosis not present

## 2024-02-18 DIAGNOSIS — M5412 Radiculopathy, cervical region: Secondary | ICD-10-CM | POA: Diagnosis not present

## 2024-02-18 DIAGNOSIS — M542 Cervicalgia: Secondary | ICD-10-CM | POA: Diagnosis not present

## 2024-02-26 DIAGNOSIS — G5602 Carpal tunnel syndrome, left upper limb: Secondary | ICD-10-CM | POA: Diagnosis not present

## 2024-03-30 ENCOUNTER — Other Ambulatory Visit: Payer: Self-pay | Admitting: Family Medicine

## 2024-03-31 DIAGNOSIS — I10 Essential (primary) hypertension: Secondary | ICD-10-CM | POA: Diagnosis not present

## 2024-03-31 DIAGNOSIS — D649 Anemia, unspecified: Secondary | ICD-10-CM | POA: Diagnosis not present

## 2024-03-31 DIAGNOSIS — G20A1 Parkinson's disease without dyskinesia, without mention of fluctuations: Secondary | ICD-10-CM | POA: Diagnosis not present

## 2024-03-31 DIAGNOSIS — G5602 Carpal tunnel syndrome, left upper limb: Secondary | ICD-10-CM | POA: Diagnosis not present

## 2024-05-20 DIAGNOSIS — M4722 Other spondylosis with radiculopathy, cervical region: Secondary | ICD-10-CM | POA: Diagnosis not present

## 2024-05-20 DIAGNOSIS — M4802 Spinal stenosis, cervical region: Secondary | ICD-10-CM | POA: Diagnosis not present

## 2024-05-20 DIAGNOSIS — G5602 Carpal tunnel syndrome, left upper limb: Secondary | ICD-10-CM | POA: Diagnosis not present

## 2024-05-26 ENCOUNTER — Other Ambulatory Visit: Payer: Self-pay | Admitting: Family Medicine

## 2024-06-09 DIAGNOSIS — I69344 Monoplegia of lower limb following cerebral infarction affecting left non-dominant side: Secondary | ICD-10-CM | POA: Diagnosis not present

## 2024-06-09 DIAGNOSIS — G20A1 Parkinson's disease without dyskinesia, without mention of fluctuations: Secondary | ICD-10-CM | POA: Diagnosis not present

## 2024-06-09 DIAGNOSIS — R29898 Other symptoms and signs involving the musculoskeletal system: Secondary | ICD-10-CM | POA: Diagnosis not present

## 2024-06-17 DIAGNOSIS — R29898 Other symptoms and signs involving the musculoskeletal system: Secondary | ICD-10-CM | POA: Diagnosis not present

## 2024-06-17 DIAGNOSIS — G20A1 Parkinson's disease without dyskinesia, without mention of fluctuations: Secondary | ICD-10-CM | POA: Diagnosis not present

## 2024-06-17 DIAGNOSIS — I69344 Monoplegia of lower limb following cerebral infarction affecting left non-dominant side: Secondary | ICD-10-CM | POA: Diagnosis not present

## 2024-06-19 DIAGNOSIS — M5412 Radiculopathy, cervical region: Secondary | ICD-10-CM | POA: Diagnosis not present

## 2024-06-24 ENCOUNTER — Other Ambulatory Visit: Payer: Self-pay | Admitting: Family Medicine

## 2024-06-25 NOTE — Telephone Encounter (Signed)
 Message sent to Pt informing he is overdue for appt back in 05/2024. Rx denied.

## 2024-07-13 ENCOUNTER — Encounter: Payer: Self-pay | Admitting: *Deleted

## 2024-07-13 ENCOUNTER — Other Ambulatory Visit: Payer: Self-pay | Admitting: Family Medicine

## 2024-07-14 DIAGNOSIS — Z23 Encounter for immunization: Secondary | ICD-10-CM | POA: Diagnosis not present

## 2024-07-21 DIAGNOSIS — N200 Calculus of kidney: Secondary | ICD-10-CM | POA: Diagnosis not present

## 2024-07-21 DIAGNOSIS — N138 Other obstructive and reflux uropathy: Secondary | ICD-10-CM | POA: Diagnosis not present

## 2024-07-21 DIAGNOSIS — N401 Enlarged prostate with lower urinary tract symptoms: Secondary | ICD-10-CM | POA: Diagnosis not present

## 2024-07-21 DIAGNOSIS — R82991 Hypocitraturia: Secondary | ICD-10-CM | POA: Diagnosis not present

## 2024-07-22 DIAGNOSIS — M47812 Spondylosis without myelopathy or radiculopathy, cervical region: Secondary | ICD-10-CM | POA: Diagnosis not present

## 2024-07-22 DIAGNOSIS — M5412 Radiculopathy, cervical region: Secondary | ICD-10-CM | POA: Diagnosis not present

## 2024-07-22 DIAGNOSIS — M4802 Spinal stenosis, cervical region: Secondary | ICD-10-CM | POA: Diagnosis not present

## 2024-07-22 DIAGNOSIS — G5602 Carpal tunnel syndrome, left upper limb: Secondary | ICD-10-CM | POA: Diagnosis not present

## 2024-07-28 DIAGNOSIS — D485 Neoplasm of uncertain behavior of skin: Secondary | ICD-10-CM | POA: Diagnosis not present

## 2024-07-28 DIAGNOSIS — L578 Other skin changes due to chronic exposure to nonionizing radiation: Secondary | ICD-10-CM | POA: Diagnosis not present

## 2024-07-28 DIAGNOSIS — L812 Freckles: Secondary | ICD-10-CM | POA: Diagnosis not present

## 2024-07-28 DIAGNOSIS — L853 Xerosis cutis: Secondary | ICD-10-CM | POA: Diagnosis not present

## 2024-07-28 DIAGNOSIS — L820 Inflamed seborrheic keratosis: Secondary | ICD-10-CM | POA: Diagnosis not present

## 2024-07-28 DIAGNOSIS — D044 Carcinoma in situ of skin of scalp and neck: Secondary | ICD-10-CM | POA: Diagnosis not present

## 2024-07-28 DIAGNOSIS — B079 Viral wart, unspecified: Secondary | ICD-10-CM | POA: Diagnosis not present

## 2024-07-28 DIAGNOSIS — D235 Other benign neoplasm of skin of trunk: Secondary | ICD-10-CM | POA: Diagnosis not present

## 2024-07-28 DIAGNOSIS — D1801 Hemangioma of skin and subcutaneous tissue: Secondary | ICD-10-CM | POA: Diagnosis not present

## 2024-07-28 DIAGNOSIS — L570 Actinic keratosis: Secondary | ICD-10-CM | POA: Diagnosis not present

## 2024-07-28 DIAGNOSIS — D225 Melanocytic nevi of trunk: Secondary | ICD-10-CM | POA: Diagnosis not present

## 2024-07-28 DIAGNOSIS — L573 Poikiloderma of Civatte: Secondary | ICD-10-CM | POA: Diagnosis not present

## 2024-07-30 DIAGNOSIS — M65311 Trigger thumb, right thumb: Secondary | ICD-10-CM | POA: Diagnosis not present

## 2024-07-30 DIAGNOSIS — G5602 Carpal tunnel syndrome, left upper limb: Secondary | ICD-10-CM | POA: Diagnosis not present

## 2024-07-30 DIAGNOSIS — M67432 Ganglion, left wrist: Secondary | ICD-10-CM | POA: Diagnosis not present

## 2024-08-05 DIAGNOSIS — L988 Other specified disorders of the skin and subcutaneous tissue: Secondary | ICD-10-CM | POA: Diagnosis not present

## 2024-08-05 DIAGNOSIS — D044 Carcinoma in situ of skin of scalp and neck: Secondary | ICD-10-CM | POA: Diagnosis not present

## 2024-08-10 DIAGNOSIS — L720 Epidermal cyst: Secondary | ICD-10-CM | POA: Diagnosis not present

## 2024-08-11 DIAGNOSIS — M4802 Spinal stenosis, cervical region: Secondary | ICD-10-CM | POA: Diagnosis not present

## 2024-08-11 DIAGNOSIS — M47812 Spondylosis without myelopathy or radiculopathy, cervical region: Secondary | ICD-10-CM | POA: Diagnosis not present

## 2024-08-19 DIAGNOSIS — M542 Cervicalgia: Secondary | ICD-10-CM | POA: Diagnosis not present

## 2024-08-19 DIAGNOSIS — M47812 Spondylosis without myelopathy or radiculopathy, cervical region: Secondary | ICD-10-CM | POA: Diagnosis not present

## 2024-08-19 DIAGNOSIS — M4802 Spinal stenosis, cervical region: Secondary | ICD-10-CM | POA: Diagnosis not present

## 2024-08-19 DIAGNOSIS — G5602 Carpal tunnel syndrome, left upper limb: Secondary | ICD-10-CM | POA: Diagnosis not present

## 2024-08-24 DIAGNOSIS — C4442 Squamous cell carcinoma of skin of scalp and neck: Secondary | ICD-10-CM | POA: Diagnosis not present
